# Patient Record
Sex: Female | Born: 1935 | Race: White | Hispanic: No | Marital: Married | Smoking: Former smoker
Health system: Southern US, Community
[De-identification: ages and names within clinical notes are randomized; demographics above are authoritative.]

## PROBLEM LIST (undated history)

## (undated) DIAGNOSIS — Z9989 Dependence on other enabling machines and devices: Secondary | ICD-10-CM

## (undated) DIAGNOSIS — I6529 Occlusion and stenosis of unspecified carotid artery: Secondary | ICD-10-CM

## (undated) DIAGNOSIS — J069 Acute upper respiratory infection, unspecified: Secondary | ICD-10-CM

## (undated) DIAGNOSIS — K219 Gastro-esophageal reflux disease without esophagitis: Secondary | ICD-10-CM

## (undated) DIAGNOSIS — G4733 Obstructive sleep apnea (adult) (pediatric): Secondary | ICD-10-CM

## (undated) DIAGNOSIS — I10 Essential (primary) hypertension: Secondary | ICD-10-CM

## (undated) DIAGNOSIS — K5090 Crohn's disease, unspecified, without complications: Secondary | ICD-10-CM

## (undated) DIAGNOSIS — I739 Peripheral vascular disease, unspecified: Secondary | ICD-10-CM

## (undated) DIAGNOSIS — I493 Ventricular premature depolarization: Secondary | ICD-10-CM

## (undated) DIAGNOSIS — E781 Pure hyperglyceridemia: Secondary | ICD-10-CM

## (undated) DIAGNOSIS — Z7901 Long term (current) use of anticoagulants: Secondary | ICD-10-CM

## (undated) DIAGNOSIS — M797 Fibromyalgia: Secondary | ICD-10-CM

## (undated) DIAGNOSIS — Z8719 Personal history of other diseases of the digestive system: Secondary | ICD-10-CM

## (undated) DIAGNOSIS — I5032 Chronic diastolic (congestive) heart failure: Secondary | ICD-10-CM

## (undated) DIAGNOSIS — H3320 Serous retinal detachment, unspecified eye: Secondary | ICD-10-CM

## (undated) DIAGNOSIS — R42 Dizziness and giddiness: Secondary | ICD-10-CM

## (undated) DIAGNOSIS — I4891 Unspecified atrial fibrillation: Secondary | ICD-10-CM

## (undated) DIAGNOSIS — M1990 Unspecified osteoarthritis, unspecified site: Secondary | ICD-10-CM

## (undated) DIAGNOSIS — I2699 Other pulmonary embolism without acute cor pulmonale: Secondary | ICD-10-CM

## (undated) DIAGNOSIS — N390 Urinary tract infection, site not specified: Secondary | ICD-10-CM

## (undated) HISTORY — DX: Long term (current) use of anticoagulants: Z79.01

## (undated) HISTORY — DX: Dizziness and giddiness: R42

## (undated) HISTORY — DX: Essential (primary) hypertension: I10

## (undated) HISTORY — DX: Obstructive sleep apnea (adult) (pediatric): G47.33

## (undated) HISTORY — DX: Occlusion and stenosis of unspecified carotid artery: I65.29

## (undated) HISTORY — DX: Other pulmonary embolism without acute cor pulmonale: I26.99

## (undated) HISTORY — PX: TONSILLECTOMY AND ADENOIDECTOMY: SUR1326

## (undated) HISTORY — PX: SPINE SURGERY: SHX786

## (undated) HISTORY — PX: CARDIAC CATHETERIZATION: SHX172

## (undated) HISTORY — DX: Unspecified atrial fibrillation: I48.91

## (undated) HISTORY — PX: CHOLECYSTECTOMY: SHX55

## (undated) HISTORY — DX: Unspecified osteoarthritis, unspecified site: M19.90

## (undated) HISTORY — PX: DECOMPRESSIVE LUMBAR LAMINECTOMY LEVEL 3: SHX5793

## (undated) HISTORY — DX: Fibromyalgia: M79.7

## (undated) HISTORY — DX: Dependence on other enabling machines and devices: Z99.89

## (undated) HISTORY — PX: DILATION AND CURETTAGE OF UTERUS: SHX78

---
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
## 1968-10-12 HISTORY — PX: CARDIAC CATHETERIZATION: SHX172

## 1969-02-11 HISTORY — PX: VAGINAL HYSTERECTOMY: SUR661

## 1997-12-28 ENCOUNTER — Ambulatory Visit (HOSPITAL_COMMUNITY): Admission: RE | Admit: 1997-12-28 | Discharge: 1997-12-28 | Payer: Self-pay | Admitting: Internal Medicine

## 1997-12-28 ENCOUNTER — Encounter: Payer: Self-pay | Admitting: Internal Medicine

## 1999-01-12 ENCOUNTER — Encounter (INDEPENDENT_AMBULATORY_CARE_PROVIDER_SITE_OTHER): Payer: Self-pay | Admitting: Specialist

## 1999-01-12 ENCOUNTER — Ambulatory Visit (HOSPITAL_COMMUNITY): Admission: RE | Admit: 1999-01-12 | Discharge: 1999-01-12 | Payer: Self-pay | Admitting: Gastroenterology

## 1999-01-16 ENCOUNTER — Encounter: Payer: Self-pay | Admitting: Internal Medicine

## 1999-01-16 ENCOUNTER — Encounter: Admission: RE | Admit: 1999-01-16 | Discharge: 1999-01-16 | Payer: Self-pay | Admitting: Internal Medicine

## 1999-10-31 ENCOUNTER — Other Ambulatory Visit: Admission: RE | Admit: 1999-10-31 | Discharge: 1999-10-31 | Payer: Self-pay | Admitting: Internal Medicine

## 2000-01-23 ENCOUNTER — Encounter: Payer: Self-pay | Admitting: Internal Medicine

## 2000-01-23 ENCOUNTER — Encounter: Admission: RE | Admit: 2000-01-23 | Discharge: 2000-01-23 | Payer: Self-pay | Admitting: Internal Medicine

## 2000-05-20 ENCOUNTER — Encounter (INDEPENDENT_AMBULATORY_CARE_PROVIDER_SITE_OTHER): Payer: Self-pay

## 2000-05-20 ENCOUNTER — Ambulatory Visit (HOSPITAL_COMMUNITY): Admission: RE | Admit: 2000-05-20 | Discharge: 2000-05-20 | Payer: Self-pay | Admitting: Orthopedic Surgery

## 2000-05-20 HISTORY — PX: EXCISION MORTON'S NEUROMA: SHX5013

## 2001-01-28 ENCOUNTER — Encounter: Admission: RE | Admit: 2001-01-28 | Discharge: 2001-01-28 | Payer: Self-pay | Admitting: Internal Medicine

## 2001-01-28 ENCOUNTER — Encounter: Payer: Self-pay | Admitting: Internal Medicine

## 2001-11-25 ENCOUNTER — Encounter: Payer: Self-pay | Admitting: Orthopedic Surgery

## 2001-11-25 ENCOUNTER — Encounter: Admission: RE | Admit: 2001-11-25 | Discharge: 2001-11-25 | Payer: Self-pay | Admitting: Orthopedic Surgery

## 2001-12-14 ENCOUNTER — Encounter: Admission: RE | Admit: 2001-12-14 | Discharge: 2001-12-14 | Payer: Self-pay | Admitting: Urology

## 2001-12-14 ENCOUNTER — Encounter: Payer: Self-pay | Admitting: Urology

## 2002-01-19 ENCOUNTER — Encounter: Admission: RE | Admit: 2002-01-19 | Discharge: 2002-01-19 | Payer: Self-pay | Admitting: Internal Medicine

## 2002-01-19 ENCOUNTER — Encounter: Payer: Self-pay | Admitting: Internal Medicine

## 2002-09-24 ENCOUNTER — Ambulatory Visit (HOSPITAL_COMMUNITY): Admission: RE | Admit: 2002-09-24 | Discharge: 2002-09-24 | Payer: Self-pay | Admitting: Orthopedic Surgery

## 2002-09-24 ENCOUNTER — Encounter: Payer: Self-pay | Admitting: Orthopedic Surgery

## 2002-11-29 ENCOUNTER — Encounter: Payer: Self-pay | Admitting: Orthopedic Surgery

## 2002-12-08 ENCOUNTER — Inpatient Hospital Stay (HOSPITAL_COMMUNITY): Admission: RE | Admit: 2002-12-08 | Discharge: 2002-12-13 | Payer: Self-pay | Admitting: Orthopedic Surgery

## 2002-12-08 HISTORY — PX: TOTAL HIP ARTHROPLASTY: SHX124

## 2003-01-25 ENCOUNTER — Encounter: Admission: RE | Admit: 2003-01-25 | Discharge: 2003-01-25 | Payer: Self-pay | Admitting: Internal Medicine

## 2003-05-24 ENCOUNTER — Encounter: Admission: RE | Admit: 2003-05-24 | Discharge: 2003-05-24 | Payer: Self-pay | Admitting: Orthopedic Surgery

## 2003-09-16 ENCOUNTER — Encounter: Admission: RE | Admit: 2003-09-16 | Discharge: 2003-09-16 | Payer: Self-pay | Admitting: Orthopedic Surgery

## 2004-03-13 ENCOUNTER — Encounter: Admission: RE | Admit: 2004-03-13 | Discharge: 2004-03-13 | Payer: Self-pay | Admitting: Internal Medicine

## 2004-11-09 ENCOUNTER — Ambulatory Visit (HOSPITAL_COMMUNITY): Admission: RE | Admit: 2004-11-09 | Discharge: 2004-11-09 | Payer: Self-pay | Admitting: Specialist

## 2004-11-22 ENCOUNTER — Ambulatory Visit (HOSPITAL_COMMUNITY): Admission: RE | Admit: 2004-11-22 | Discharge: 2004-11-22 | Payer: Self-pay | Admitting: Internal Medicine

## 2004-12-25 ENCOUNTER — Encounter: Admission: RE | Admit: 2004-12-25 | Discharge: 2005-03-25 | Payer: Self-pay | Admitting: Internal Medicine

## 2005-03-26 ENCOUNTER — Encounter: Admission: RE | Admit: 2005-03-26 | Discharge: 2005-05-06 | Payer: Self-pay | Admitting: Internal Medicine

## 2005-04-02 ENCOUNTER — Encounter: Admission: RE | Admit: 2005-04-02 | Discharge: 2005-04-02 | Payer: Self-pay | Admitting: Internal Medicine

## 2005-07-23 ENCOUNTER — Encounter: Admission: RE | Admit: 2005-07-23 | Discharge: 2005-07-23 | Payer: Self-pay | Admitting: Orthopedic Surgery

## 2005-07-26 ENCOUNTER — Ambulatory Visit (HOSPITAL_BASED_OUTPATIENT_CLINIC_OR_DEPARTMENT_OTHER): Admission: RE | Admit: 2005-07-26 | Discharge: 2005-07-26 | Payer: Self-pay | Admitting: Orthopedic Surgery

## 2005-07-26 HISTORY — PX: KNEE ARTHROSCOPY: SHX127

## 2006-04-07 ENCOUNTER — Encounter: Admission: RE | Admit: 2006-04-07 | Discharge: 2006-04-07 | Payer: Self-pay | Admitting: Internal Medicine

## 2006-09-22 ENCOUNTER — Encounter: Admission: RE | Admit: 2006-09-22 | Discharge: 2006-09-22 | Payer: Self-pay | Admitting: Orthopedic Surgery

## 2006-10-29 HISTORY — PX: BACK SURGERY: SHX140

## 2006-10-30 ENCOUNTER — Inpatient Hospital Stay (HOSPITAL_COMMUNITY): Admission: RE | Admit: 2006-10-30 | Discharge: 2006-10-31 | Payer: Self-pay | Admitting: Orthopedic Surgery

## 2007-01-16 ENCOUNTER — Ambulatory Visit: Payer: Self-pay | Admitting: Vascular Surgery

## 2007-02-12 HISTORY — PX: CATARACT EXTRACTION W/ INTRAOCULAR LENS IMPLANT: SHX1309

## 2007-02-12 HISTORY — PX: JOINT REPLACEMENT: SHX530

## 2007-04-28 ENCOUNTER — Encounter: Admission: RE | Admit: 2007-04-28 | Discharge: 2007-04-28 | Payer: Self-pay | Admitting: Internal Medicine

## 2008-01-25 ENCOUNTER — Ambulatory Visit: Payer: Self-pay | Admitting: Vascular Surgery

## 2008-05-02 ENCOUNTER — Encounter: Admission: RE | Admit: 2008-05-02 | Discharge: 2008-05-02 | Payer: Self-pay | Admitting: Internal Medicine

## 2008-08-29 ENCOUNTER — Encounter: Admission: RE | Admit: 2008-08-29 | Discharge: 2008-08-29 | Payer: Self-pay | Admitting: Unknown Physician Specialty

## 2008-10-25 ENCOUNTER — Ambulatory Visit (HOSPITAL_BASED_OUTPATIENT_CLINIC_OR_DEPARTMENT_OTHER): Admission: RE | Admit: 2008-10-25 | Discharge: 2008-10-25 | Payer: Self-pay | Admitting: Orthopedic Surgery

## 2008-10-25 HISTORY — PX: HAMMER TOE SURGERY: SHX385

## 2009-01-23 ENCOUNTER — Ambulatory Visit: Payer: Self-pay | Admitting: Vascular Surgery

## 2009-02-11 DIAGNOSIS — I2699 Other pulmonary embolism without acute cor pulmonale: Secondary | ICD-10-CM

## 2009-02-11 HISTORY — DX: Other pulmonary embolism without acute cor pulmonale: I26.99

## 2009-02-11 HISTORY — PX: FRACTURE SURGERY: SHX138

## 2009-02-18 ENCOUNTER — Inpatient Hospital Stay (HOSPITAL_COMMUNITY): Admission: EM | Admit: 2009-02-18 | Discharge: 2009-02-28 | Payer: Self-pay | Admitting: Emergency Medicine

## 2009-02-18 ENCOUNTER — Ambulatory Visit: Payer: Self-pay | Admitting: Internal Medicine

## 2009-02-21 HISTORY — PX: FEMUR FRACTURE SURGERY: SHX633

## 2009-02-23 ENCOUNTER — Ambulatory Visit: Payer: Self-pay | Admitting: Surgery

## 2009-02-23 ENCOUNTER — Encounter: Payer: Self-pay | Admitting: Internal Medicine

## 2009-05-09 ENCOUNTER — Encounter: Admission: RE | Admit: 2009-05-09 | Discharge: 2009-05-09 | Payer: Self-pay | Admitting: Internal Medicine

## 2009-07-18 ENCOUNTER — Ambulatory Visit: Payer: Self-pay | Admitting: Vascular Surgery

## 2009-10-02 ENCOUNTER — Ambulatory Visit: Payer: Self-pay | Admitting: Cardiology

## 2009-11-06 ENCOUNTER — Ambulatory Visit: Payer: Self-pay | Admitting: Cardiology

## 2009-12-05 ENCOUNTER — Ambulatory Visit: Payer: Self-pay | Admitting: Cardiovascular Disease

## 2009-12-19 ENCOUNTER — Ambulatory Visit: Payer: Self-pay | Admitting: Cardiovascular Disease

## 2010-01-08 ENCOUNTER — Ambulatory Visit: Payer: Self-pay | Admitting: Cardiology

## 2010-01-15 ENCOUNTER — Telehealth (INDEPENDENT_AMBULATORY_CARE_PROVIDER_SITE_OTHER): Payer: Self-pay | Admitting: *Deleted

## 2010-01-16 ENCOUNTER — Encounter: Payer: Self-pay | Admitting: Cardiology

## 2010-01-16 ENCOUNTER — Ambulatory Visit: Payer: Self-pay

## 2010-01-16 ENCOUNTER — Encounter (HOSPITAL_COMMUNITY)
Admission: RE | Admit: 2010-01-16 | Discharge: 2010-03-13 | Payer: Self-pay | Source: Home / Self Care | Attending: Cardiology | Admitting: Cardiology

## 2010-01-16 ENCOUNTER — Encounter: Payer: Self-pay | Admitting: *Deleted

## 2010-01-17 ENCOUNTER — Ambulatory Visit: Payer: Self-pay

## 2010-01-29 ENCOUNTER — Ambulatory Visit: Payer: Self-pay | Admitting: Vascular Surgery

## 2010-02-08 ENCOUNTER — Ambulatory Visit: Payer: Self-pay | Admitting: Cardiology

## 2010-02-11 HISTORY — PX: JOINT REPLACEMENT: SHX530

## 2010-02-19 ENCOUNTER — Inpatient Hospital Stay (HOSPITAL_COMMUNITY)
Admission: RE | Admit: 2010-02-19 | Discharge: 2010-02-22 | Payer: Self-pay | Source: Home / Self Care | Attending: Orthopedic Surgery | Admitting: Orthopedic Surgery

## 2010-02-19 HISTORY — PX: REPLACEMENT TOTAL KNEE: SUR1224

## 2010-02-26 LAB — BASIC METABOLIC PANEL
BUN: 13 mg/dL (ref 6–23)
BUN: 17 mg/dL (ref 6–23)
CO2: 24 mEq/L (ref 19–32)
CO2: 25 mEq/L (ref 19–32)
Calcium: 8.4 mg/dL (ref 8.4–10.5)
Calcium: 8 mg/dL — ABNORMAL LOW (ref 8.4–10.5)
Chloride: 105 mEq/L (ref 96–112)
Chloride: 106 mEq/L (ref 96–112)
Creatinine, Ser: 0.95 mg/dL (ref 0.4–1.2)
Creatinine, Ser: 1.28 mg/dL — ABNORMAL HIGH (ref 0.4–1.2)
GFR calc Af Amer: >60 mL/min (ref >60)
GFR calc Af Amer: 49 mL/min — ABNORMAL LOW (ref >60)
GFR calc non Af Amer: 58 mL/min — ABNORMAL LOW (ref >60)
GFR calc non Af Amer: 41 mL/min — ABNORMAL LOW (ref >60)
Glucose, Bld: 122 mg/dL — ABNORMAL HIGH (ref 70–99)
Glucose, Bld: 142 mg/dL — ABNORMAL HIGH (ref 70–99)
Potassium: 4 mEq/L (ref 3.5–5.1)
Potassium: 3.6 mEq/L (ref 3.5–5.1)
Sodium: 138 mEq/L (ref 135–145)
Sodium: 138 mEq/L (ref 135–145)

## 2010-02-26 LAB — CBC
HCT: 32.4 % — ABNORMAL LOW (ref 36.0–46.0)
HCT: 29.7 % — ABNORMAL LOW (ref 36.0–46.0)
HCT: 29.2 % — ABNORMAL LOW (ref 36.0–46.0)
Hemoglobin: 10.4 g/dL — ABNORMAL LOW (ref 12.0–15.0)
Hemoglobin: 9.4 g/dL — ABNORMAL LOW (ref 12.0–15.0)
Hemoglobin: 9.3 g/dL — ABNORMAL LOW (ref 12.0–15.0)
MCH: 28.8 pg (ref 26.0–34.0)
MCH: 28.7 pg (ref 26.0–34.0)
MCH: 28.8 pg (ref 26.0–34.0)
MCHC: 32.1 g/dL (ref 30.0–36.0)
MCHC: 31.6 g/dL (ref 30.0–36.0)
MCHC: 31.8 g/dL (ref 30.0–36.0)
MCV: 89.8 fL (ref 78.0–100.0)
MCV: 90.5 fL (ref 78.0–100.0)
MCV: 90.4 fL (ref 78.0–100.0)
Platelets: 100 10*3/uL — ABNORMAL LOW (ref 150–400)
Platelets: 100 10*3/uL — ABNORMAL LOW (ref 150–400)
Platelets: 114 10*3/uL — ABNORMAL LOW (ref 150–400)
RBC: 3.61 MIL/uL — ABNORMAL LOW (ref 3.87–5.11)
RBC: 3.28 MIL/uL — ABNORMAL LOW (ref 3.87–5.11)
RBC: 3.23 MIL/uL — ABNORMAL LOW (ref 3.87–5.11)
RDW: 15 % (ref 11.5–15.5)
RDW: 15.2 % (ref 11.5–15.5)
RDW: 15.1 % (ref 11.5–15.5)
WBC: 7.3 10*3/uL (ref 4.0–10.5)
WBC: 6.9 10*3/uL (ref 4.0–10.5)
WBC: 6.2 10*3/uL (ref 4.0–10.5)

## 2010-02-26 LAB — TYPE AND SCREEN
ABO/RH(D): A POS
Antibody Screen: NEGATIVE

## 2010-02-26 LAB — PROTIME-INR
INR: 1.24 (ref 0.00–1.49)
INR: 1.26 (ref 0.00–1.49)
INR: 1.28 (ref 0.00–1.49)
INR: 1.23 (ref 0.00–1.49)
Prothrombin Time: 15.8 seconds — ABNORMAL HIGH (ref 11.6–15.2)
Prothrombin Time: 16 seconds — ABNORMAL HIGH (ref 11.6–15.2)
Prothrombin Time: 16.2 seconds — ABNORMAL HIGH (ref 11.6–15.2)
Prothrombin Time: 15.7 seconds — ABNORMAL HIGH (ref 11.6–15.2)

## 2010-02-26 LAB — APTT: aPTT: 28 seconds (ref 24–37)

## 2010-03-13 NOTE — Assessment & Plan Note (Signed)
Summary: Cardiology Nuclear Testing  Nuclear Med Background Indications for Stress Test: Evaluation for Ischemia, Surgical Clearance  Indications Comments: Pending TKR on 02/19/10 by Dr. Gaynelle Arabian  History: Abnormal EKG, Echo, Heart Catheterization, Myocardial Perfusion Study  History Comments: '94 Cath:normal; '08 MPS:no ischemia; 1/11 Echo:EF=60-65%  Symptoms: DOE, Rapid HR    Nuclear Pre-Procedure Cardiac Risk Factors: Carotid Disease, Family History - CAD, History of Smoking, Hypertension, Lipids, Obesity Caffeine/Decaff Intake: None NPO After: 6:00 PM Lungs: Clear.  O2 Sat 96% on RA. IV 0.9% NS with Angio Cath: 24g     IV Site: R Forearm IV Started by: Irven Baltimore, RN Chest Size (in) 40     Cup Size C     Height (in): 62 Weight (lb): 225 BMI: 41.30 Tech Comments: Toprol held this a.m.  Nuclear Med Study 1 or 2 day study:  2 day     Stress Test Type:  Carlton Adam Reading MD:  Darlin Coco, MD     Referring MD:  Darlin Coco, MD; Gaynelle Arabian, MD Resting Radionuclide:  Technetium 5mTetrofosmin     Resting Radionuclide Dose:  33 mCi  Stress Radionuclide:  Technetium 931metrofosmin     Stress Radionuclide Dose:  33 mCi   Stress Protocol   Lexiscan: 0.4 mg   Stress Test Technologist:  ShValetta FullerCMA-N     Nuclear Technologist:  ToAnnye RuskCNMT  Rest Procedure  Myocardial perfusion imaging was performed at rest 45 minutes following the intravenous administration of Technetium 9980mtrofosmin.  Stress Procedure  The patient received IV Lexiscan 0.4 mg over 15-seconds.  Technetium 39m74mrofosmin injected at 30-seconds.  There were no significant changes with infusion, other than occasional PVC's.  Quantitative spect images were obtained after a 45 minute delay.  QPS Raw Data Images:  Normal; no motion artifact; normal heart/lung ratio. Stress Images:  Normal homogeneous uptake in all areas of the myocardium. Rest Images:  Normal homogeneous uptake in  all areas of the myocardium. Subtraction (SDS):  No evidence of ischemia. Transient Ischemic Dilatation:  .88  (Normal <1.22)  Lung/Heart Ratio:  .26  (Normal <0.45)  Quantitative Gated Spect Images QGS EDV:  63 ml QGS ESV:  14 ml QGS EF:  78 % QGS cine images:  No wall motion abnormalities.  Findings Normal nuclear study      Overall Impression  Exercise Capacity: Lexiscan with no exercise. BP Response: Normal blood pressure response. Clinical Symptoms: No chest pain ECG Impression: Anterolateral ST-T abnormalities at rest remain unchanged with stress. Overall Impression: Normal stress nuclear study. Overall Impression Comments: No ischemia.  Normal LV systolic function.  Appended Document: Cardiology Nuclear Testing COPY FAXED TO DR. ALUIWynelle LinkO DR.BRACKBILL

## 2010-03-13 NOTE — Progress Notes (Signed)
Summary: Nuclear Pre-Procedure  Phone Note Outgoing Call   Call placed by: Perrin Maltese, EMT-P,  January 15, 2010 1:15 PM Summary of Call: Reviewed information on [employer] Information Sheet (see scanned document for further details).  Spoke with patient.     Nuclear Med Background Indications for Stress Test: Evaluation for Ischemia, Surgical Clearance  Indications Comments: Pending knee replacement  History: Abnormal EKG, Echo, Heart Catheterization, Myocardial Perfusion Study  History Comments: '94 Heart Cath NL '08 MPS (-) ischemia NLEF 01/11 ECHO EF 60-65% H/O PE  Crohn's  Symptoms: DOE    Nuclear Pre-Procedure Cardiac Risk Factors: Carotid Disease, History of Smoking, Hypertension, Lipids  Nuclear Med Study Referring MD:  T.Brackbill

## 2010-04-04 ENCOUNTER — Other Ambulatory Visit: Payer: Self-pay | Admitting: Internal Medicine

## 2010-04-04 DIAGNOSIS — Z1231 Encounter for screening mammogram for malignant neoplasm of breast: Secondary | ICD-10-CM

## 2010-04-11 ENCOUNTER — Ambulatory Visit: Payer: Self-pay | Admitting: Cardiology

## 2010-04-16 ENCOUNTER — Encounter (INDEPENDENT_AMBULATORY_CARE_PROVIDER_SITE_OTHER): Payer: Medicare Other

## 2010-04-16 DIAGNOSIS — I119 Hypertensive heart disease without heart failure: Secondary | ICD-10-CM

## 2010-04-16 DIAGNOSIS — Z7901 Long term (current) use of anticoagulants: Secondary | ICD-10-CM

## 2010-04-23 LAB — COMPREHENSIVE METABOLIC PANEL
ALT: 26 U/L (ref 0–35)
AST: 29 U/L (ref 0–37)
CO2: 26 mEq/L (ref 19–32)
Calcium: 9.4 mg/dL (ref 8.4–10.5)
Creatinine, Ser: 0.95 mg/dL (ref 0.4–1.2)
GFR calc Af Amer: >60 mL/min (ref >60)
GFR calc non Af Amer: 58 mL/min — ABNORMAL LOW (ref >60)
Sodium: 144 mEq/L (ref 135–145)
Total Protein: 7 g/dL (ref 6.0–8.3)

## 2010-04-23 LAB — SURGICAL PCR SCREEN
MRSA, PCR: NEGATIVE
Staphylococcus aureus: NEGATIVE

## 2010-04-23 LAB — URINALYSIS, ROUTINE W REFLEX MICROSCOPIC
Bilirubin Urine: NEGATIVE
Protein, ur: NEGATIVE mg/dL
pH: 5.5 (ref 5.0–8.0)

## 2010-04-23 LAB — CBC
HCT: 42.4 % (ref 36.0–46.0)
MCH: 29.3 pg (ref 26.0–34.0)
MCV: 90 fL (ref 78.0–100.0)
Platelets: 144 10*3/uL — ABNORMAL LOW (ref 150–400)
RBC: 4.71 MIL/uL (ref 3.87–5.11)
WBC: 8.3 10*3/uL (ref 4.0–10.5)

## 2010-04-23 LAB — URINE MICROSCOPIC-ADD ON

## 2010-04-23 LAB — PROTIME-INR: Prothrombin Time: 27.9 seconds — ABNORMAL HIGH (ref 11.6–15.2)

## 2010-04-23 LAB — APTT: aPTT: 40 seconds — ABNORMAL HIGH (ref 24–37)

## 2010-04-28 LAB — BASIC METABOLIC PANEL
BUN: 11 mg/dL (ref 6–23)
BUN: 13 mg/dL (ref 6–23)
Calcium: 8.4 mg/dL (ref 8.4–10.5)
Calcium: 9.4 mg/dL (ref 8.4–10.5)
Chloride: 104 mEq/L (ref 96–112)
Chloride: 107 mEq/L (ref 96–112)
Creatinine, Ser: 0.78 mg/dL (ref 0.4–1.2)
Creatinine, Ser: 1.01 mg/dL (ref 0.4–1.2)
GFR calc Af Amer: >60 mL/min (ref >60)
GFR calc Af Amer: >60 mL/min (ref >60)
GFR calc non Af Amer: >60 mL/min (ref >60)
Potassium: 3.3 mEq/L — ABNORMAL LOW (ref 3.5–5.1)
Sodium: 140 mEq/L (ref 135–145)

## 2010-04-28 LAB — CBC
HCT: 29.9 % — ABNORMAL LOW (ref 36.0–46.0)
HCT: 30.9 % — ABNORMAL LOW (ref 36.0–46.0)
HCT: 30.9 % — ABNORMAL LOW (ref 36.0–46.0)
Hemoglobin: 10.1 g/dL — ABNORMAL LOW (ref 12.0–15.0)
Hemoglobin: 10.4 g/dL — ABNORMAL LOW (ref 12.0–15.0)
Hemoglobin: 12.7 g/dL (ref 12.0–15.0)
Hemoglobin: 9.8 g/dL — ABNORMAL LOW (ref 12.0–15.0)
MCHC: 33.8 g/dL (ref 30.0–36.0)
MCHC: 33.9 g/dL (ref 30.0–36.0)
MCV: 91.7 fL (ref 78.0–100.0)
MCV: 92.1 fL (ref 78.0–100.0)
MCV: 92.3 fL (ref 78.0–100.0)
Platelets: 103 10*3/uL — ABNORMAL LOW (ref 150–400)
Platelets: 114 10*3/uL — ABNORMAL LOW (ref 150–400)
RBC: 3.15 MIL/uL — ABNORMAL LOW (ref 3.87–5.11)
RBC: 3.36 MIL/uL — ABNORMAL LOW (ref 3.87–5.11)
RBC: 3.37 MIL/uL — ABNORMAL LOW (ref 3.87–5.11)
RBC: 4.14 MIL/uL (ref 3.87–5.11)
RDW: 15.1 % (ref 11.5–15.5)
WBC: 8.3 10*3/uL (ref 4.0–10.5)
WBC: 8.6 10*3/uL (ref 4.0–10.5)
WBC: 8.9 10*3/uL (ref 4.0–10.5)
WBC: 9.4 10*3/uL (ref 4.0–10.5)

## 2010-04-28 LAB — TYPE AND SCREEN: Antibody Screen: NEGATIVE

## 2010-04-28 LAB — DIFFERENTIAL
Eosinophils Relative: 2 % (ref 0–5)
Lymphocytes Relative: 26 % (ref 12–46)
Lymphs Abs: 2.2 10*3/uL (ref 0.7–4.0)
Monocytes Absolute: 0.9 10*3/uL (ref 0.1–1.0)
Monocytes Relative: 11 % (ref 3–12)

## 2010-04-28 LAB — BRAIN NATRIURETIC PEPTIDE: Pro B Natriuretic peptide (BNP): 508 pg/mL — ABNORMAL HIGH (ref 0.0–100.0)

## 2010-04-28 LAB — PROTIME-INR
INR: 1.07 (ref 0.00–1.49)
Prothrombin Time: 13.8 seconds (ref 11.6–15.2)

## 2010-04-29 LAB — HEPARIN LEVEL (UNFRACTIONATED)
Heparin Unfractionated: 0.27 IU/mL — ABNORMAL LOW (ref 0.30–0.70)
Heparin Unfractionated: 0.48 IU/mL (ref 0.30–0.70)

## 2010-04-29 LAB — PROTIME-INR
INR: 1.89 — ABNORMAL HIGH (ref 0.00–1.49)
INR: 2.17 — ABNORMAL HIGH (ref 0.00–1.49)
Prothrombin Time: 21.5 seconds — ABNORMAL HIGH (ref 11.6–15.2)
Prothrombin Time: 24 seconds — ABNORMAL HIGH (ref 11.6–15.2)

## 2010-04-29 LAB — CBC
HCT: 29.7 % — ABNORMAL LOW (ref 36.0–46.0)
Hemoglobin: 9.8 g/dL — ABNORMAL LOW (ref 12.0–15.0)
Hemoglobin: 9.9 g/dL — ABNORMAL LOW (ref 12.0–15.0)
MCHC: 33.4 g/dL (ref 30.0–36.0)
MCHC: 33.8 g/dL (ref 30.0–36.0)
MCV: 91.6 fL (ref 78.0–100.0)
Platelets: 162 10*3/uL (ref 150–400)
Platelets: 202 10*3/uL (ref 150–400)
RDW: 14.7 % (ref 11.5–15.5)
RDW: 14.7 % (ref 11.5–15.5)
WBC: 8.3 10*3/uL (ref 4.0–10.5)

## 2010-04-30 ENCOUNTER — Ambulatory Visit (INDEPENDENT_AMBULATORY_CARE_PROVIDER_SITE_OTHER): Payer: Medicare Other | Admitting: Cardiology

## 2010-04-30 DIAGNOSIS — I2699 Other pulmonary embolism without acute cor pulmonale: Secondary | ICD-10-CM

## 2010-04-30 DIAGNOSIS — Z7901 Long term (current) use of anticoagulants: Secondary | ICD-10-CM

## 2010-05-15 ENCOUNTER — Ambulatory Visit: Payer: Self-pay

## 2010-05-18 LAB — POCT I-STAT 4, (NA,K, GLUC, HGB,HCT)
Hemoglobin: 13.3 g/dL (ref 12.0–15.0)
Sodium: 140 mEq/L (ref 135–145)

## 2010-05-18 LAB — WOUND CULTURE

## 2010-05-28 ENCOUNTER — Encounter: Payer: PRIVATE HEALTH INSURANCE | Admitting: *Deleted

## 2010-05-28 ENCOUNTER — Ambulatory Visit (INDEPENDENT_AMBULATORY_CARE_PROVIDER_SITE_OTHER): Payer: Medicare Other | Admitting: *Deleted

## 2010-05-28 DIAGNOSIS — I2699 Other pulmonary embolism without acute cor pulmonale: Secondary | ICD-10-CM | POA: Insufficient documentation

## 2010-05-28 DIAGNOSIS — Z7901 Long term (current) use of anticoagulants: Secondary | ICD-10-CM

## 2010-05-28 LAB — POCT INR: INR: 1.8

## 2010-06-11 ENCOUNTER — Other Ambulatory Visit: Payer: Self-pay | Admitting: *Deleted

## 2010-06-11 DIAGNOSIS — R609 Edema, unspecified: Secondary | ICD-10-CM

## 2010-06-11 MED ORDER — FUROSEMIDE 40 MG PO TABS: ORAL_TABLET | ORAL | Status: DC

## 2010-06-11 NOTE — Telephone Encounter (Signed)
Refilled meds per fax request.  

## 2010-06-12 ENCOUNTER — Ambulatory Visit
Admission: RE | Admit: 2010-06-12 | Discharge: 2010-06-12 | Disposition: A | Discharge status: Home / Self Care | Payer: Medicare Other | Source: Ambulatory Visit | Attending: Internal Medicine | Admitting: Internal Medicine

## 2010-06-12 DIAGNOSIS — Z1231 Encounter for screening mammogram for malignant neoplasm of breast: Secondary | ICD-10-CM

## 2010-06-19 ENCOUNTER — Ambulatory Visit (INDEPENDENT_AMBULATORY_CARE_PROVIDER_SITE_OTHER): Payer: Medicare Other | Admitting: *Deleted

## 2010-06-19 DIAGNOSIS — Z7901 Long term (current) use of anticoagulants: Secondary | ICD-10-CM

## 2010-06-19 DIAGNOSIS — I2699 Other pulmonary embolism without acute cor pulmonale: Secondary | ICD-10-CM

## 2010-06-25 ENCOUNTER — Encounter: Payer: PRIVATE HEALTH INSURANCE | Admitting: *Deleted

## 2010-06-26 ENCOUNTER — Encounter: Payer: PRIVATE HEALTH INSURANCE | Admitting: *Deleted

## 2010-06-26 NOTE — Procedures (Signed)
CAROTID DUPLEX EXAM   INDICATION:  Follow-up evaluation of known carotid artery disease.   HISTORY:  Diabetes:  No.  Cardiac:  No.  Hypertension:  Yes.  Smoking:  Quit over 15 years ago.  Previous Surgery:  No.  CV History:  Previous duplex on 01/16/2007 revealed an occluded right  ICA and a 60-79% left ICA stenosis.  Amaurosis Fugax No, Paresthesias No, Hemiparesis No.                                       RIGHT             LEFT  Brachial systolic pressure:         160               160  Brachial Doppler waveforms:         Triphasic         Triphasic  Vertebral direction of flow:        Antegrade         Antegrade  DUPLEX VELOCITIES (cm/sec)  CCA peak systolic                   49                78  ECA peak systolic                   175               208  ICA peak systolic                   Occluded          138  ICA end diastolic                   Occluded          50  PLAQUE MORPHOLOGY:                  Mixed             Calcified  PLAQUE AMOUNT:                      Severe            Moderate-to-severe  PLAQUE LOCATION:                    Throughout ICA    Proximal ICA, ECA   IMPRESSION:  1. Occluded right internal carotid artery.  2. 60-79% left internal carotid artery stenosis.  3. No significant change from previous study.   ___________________________________________  Rosetta Posner, M.D.   MC/MEDQ  D:  01/25/2008  T:  01/26/2008  Job:  [id number]

## 2010-06-26 NOTE — Op Note (Signed)
[name], [name]                 ACCOUNT NO.:  [id number]   MEDICAL RECORD NO.:  [id number]          PATIENT TYPE:  AMB   LOCATION:  DAY                          FACILITY:  [facility]   PHYSICIAN:  Tarri Glenn, M.D.  DATE OF BIRTH:  1934/10/20   DATE OF PROCEDURE:  10/29/2006  DATE OF DISCHARGE:                               OPERATIVE REPORT   PREOPERATIVE DIAGNOSIS:  1. Central and foraminal stenosis L3-L4, L4-L5, and L5-S1.  2. Possible disc herniations L4-L5 and L5-S1, right.   POSTOPERATIVE DIAGNOSIS:  Central and foraminal stenosis L3-L4, L4-L5,  and L5-S1.   OPERATION:  Central and foraminal decompression L3-L4, L4-L5, and L5-S1  with inspection of L4-L5 and L5-S1 disc on the right.   SURGEON:  Tarri Glenn, M.D.   ASSISTANT:  Kipp Brood. Gladstone Lighter, M.D.   ANESTHESIA:  General.   JUSTIFICATION FOR PROCEDURE:  I have been following her for a number of  years because of mild lumbar spinal stenosis.  Recently her leg  symptomatology has become much more severe with burning in both feet and  generalized numbness, particularly in the right foot with severe right  leg pain. A myelogram CT scan on September 22, 2006, demonstrated the above  mentioned diagnoses 1 and 2.  Consequently, she is here today for the  above mentioned surgery.   PROCEDURE:  Prophylactic antibiotics, satisfactory general anesthesia,  she was carefully rolled on a Wilson frame protecting her right total  hip replacement.  Her back was prepped with DuraPrep and draped in a  sterile field.  Ioban employed, a time out performed. A Foley catheter  was also inserted after the induction of general anesthesia.  I made a  midline incision and after identifying what appeared to be the spinous  process of L3 and L4, I tagged these with Kocher clamps and we took the  lateral x-ray identifying that these were probably on L3 and L4.  I then  continued the dissection distally until we reached the sacrum. Soft  tissue  was dissected off the neural arches at L3 to the sacrum and two  self-retaining McCullough retractors inserted.  I then began the  decompression process with double action rongeurs, removed the neural  arch of L5, L4, and a portion of the inferior spinous process of L3.  I  then took a second x-ray confirming that we were indeed, with the  Portland 4 instrument in place, at the L3-L4 level.   After removing a little bit more bone and finding her spinal canal quite  narrow making it difficult to work even with small Kerrison rongeurs, I  brought in the microscope and we completed the decompression centrally  and then worked laterally. Most of her pathology appeared to be at L4-  L5. Foraminotomies were performed at all levels.  Preoperatively, the S1  nerve root had poor filling on the right.  This nerve root was  thoroughly decompressed as part of the decompression procedure.  We then  inspected both the L4-L5 and L5-S1 disc which appeared to be flat.  Having completed the foraminotomies and decompression  to a hockey stick  both cephalad and caudad, we felt that we had completed the goals of the  surgery.   The wound was irrigated well with sterile saline.  Gelfoam soaked in  thrombin was placed about the dura.  The self-retaining retractors were  then carefully removed.  There was no unusual bleeding.  I then closed  the wound in layers with interrupted #1 Vicryl in the fascia leaving  about a 2 cm gap distally to prevent any egress of blood.  The deep  subcu tissue was closed with #1 Vicryl, superficially 2-0 Vicryl,  staples in the skin.  A Betadine Adaptic dry, sterile dressing were  applied.  She was gently and carefully, once again, placed on the PACU  bed with no known complications.  Estimated blood loss was perhaps 250  mL, no blood replacement.           ______________________________  Tarri Glenn, M.D.     JA/MEDQ  D:  10/29/2006  T:  10/29/2006  Job:  [id number]

## 2010-06-26 NOTE — Procedures (Signed)
CAROTID DUPLEX EXAM   INDICATION:  Follow up carotid disease.   HISTORY:  Diabetes:  No.  Cardiac:  No.  Hypertension:  Yes.  Smoking:  Previous.  Previous Surgery:  No.  CV History:  Asymptomatic.  Amaurosis Fugax No, Paresthesias No, Hemiparesis No.                                       RIGHT             LEFT  Brachial systolic pressure:         140               139  Brachial Doppler waveforms:         Triphasic         Triphasic  Vertebral direction of flow:        Antegrade         Antegrade  DUPLEX VELOCITIES (cm/sec)  CCA peak systolic                   61                747  ECA peak systolic                   90                340  ICA peak systolic                   Occluded          204 (bulb)  ICA end diastolic                   Occluded          26  PLAQUE MORPHOLOGY:                  Mixed             Mixed/calcified  PLAQUE AMOUNT:                      Severe            Moderate-to-severe  PLAQUE LOCATION:                    ICA               Bifurcation, ICA,  ECA   IMPRESSION:  1. Known right internal carotid artery occlusion.  2. 60-79% left internal carotid artery stenosis.  3. Left external carotid artery and common carotid artery stenosis.   ___________________________________________  Rosetta Posner, M.D.   CJ/MEDQ  D:  01/23/2009  T:  01/24/2009  Job:  [id number]

## 2010-06-26 NOTE — Procedures (Signed)
CAROTID DUPLEX EXAM   INDICATION:  Follow up carotid artery disease.   HISTORY:  Diabetes:  No.  Cardiac:  No.  Hypertension:  Yes.  Smoking:  Previous.  Previous Surgery:  No.  CV History:  No.  Amaurosis Fugax No, Paresthesias No, Hemiparesis No.                                       RIGHT             LEFT  Brachial systolic pressure:         140               148  Brachial Doppler waveforms:         WNL               WNL  Vertebral direction of flow:        Antegrade         Antegrade  DUPLEX VELOCITIES (cm/sec)  CCA peak systolic                   43                98  ECA peak systolic                   426               454  ICA peak systolic                   Occluded          098  ICA end diastolic                   Occluded          69  PLAQUE MORPHOLOGY:                  Heterogenous      Heterogenous  PLAQUE AMOUNT:                      Severe            Moderate-to-severe  PLAQUE LOCATION:                    ICA, ECA          ICA, ECA, bulb   IMPRESSION:  1. Known right internal carotid artery occlusion.  2. Left internal carotid artery suggests 60% to 79% stenosis.  3. Bilateral external carotid artery stenoses.  4. Antegrade flow in bilateral vertebrals.   ___________________________________________  Rosetta Posner, M.D.   CB/MEDQ  D:  07/18/2009  T:  07/18/2009  Job:  [id number]

## 2010-06-26 NOTE — Procedures (Signed)
CAROTID DUPLEX EXAM   INDICATION:  Follow up carotid artery disease.   HISTORY:  Diabetes:  No.  Cardiac:  No.  Hypertension:  Yes.  Smoking:  No.  Previous Surgery:  No.  CV History:  No.  Amaurosis Fugax No, Paresthesias No, Hemiparesis No                                       RIGHT             LEFT  Brachial systolic pressure:         158               157  Brachial Doppler waveforms:         Biphasic          Biphasic  Vertebral direction of flow:        Antegrade         Antegrade  DUPLEX VELOCITIES (cm/sec)  CCA peak systolic                   44                86  ECA peak systolic                   343               103  ICA peak systolic                   Occluded          013  ICA end diastolic                                     42  PLAQUE MORPHOLOGY:                  Mixed             Mixed  PLAQUE AMOUNT:                      Severe            Moderate/severe  PLAQUE LOCATION:                    ICA, ECA          ICA, ECA   IMPRESSION:  1. Right internal carotid artery known occlusion.  2. Left internal carotid artery shows evidence of 60-79% stenosis.  3. Bilateral external carotid artery stenosis.  4. Study shows no significant changes from previous study.   ___________________________________________  Rosetta Posner, M.D.   AS/MEDQ  D:  01/16/2007  T:  01/16/2007  Job:  [id number]

## 2010-06-26 NOTE — Assessment & Plan Note (Signed)
OFFICE VISIT   [name], [name]  DOB:  1934/07/05                                       07/18/2009  CHART#:[id number]   CHIEF COMPLAINT:  Follow-up left carotid stenosis.   HISTORY OF PRESENT ILLNESS:  Patient is a 76 year old woman who has a  known occlusion of the right internal carotid artery with no evidence of  stroke, who also has a 60% to 79% stenosis in the left carotid artery.  Since her last visit 6 months ago, she has been doing well without any  symptoms of TIA, amaurosis fugax, weakness on one side or the other, or  any dysarthria.  She had in January 2011 a fractured right femur and had  surgery with a titanium wiring of the fracture.  Post surgery, she  developed a PE and pneumonia.  She was placed on Coumadin and should be  off her Coumadin approximately 1 month.   PAST MEDICAL HISTORY:  She has a history of high blood pressure and  hypercholesterolemia.   SURGERIES:  She had a fractured femur repair in January 2011.  She had a  right total hip replacement in 2007 and a lumbar decompression in 2008.   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   REVIEW OF SYSTEMS:  GENERAL:  She is had some weight loss, which is due  to dieting.  VASCULAR:  She has had no stroke, mini stroke, slurred speech, temporary  blindness.  She has had the pulmonary embolus.  CARDIAC:  She has dyspnea on exertion but denies chest pain, chest  tightness, heart murmurs, or arrhythmias.  GASTROINTESTINAL:  She has a hiatal hernia.  NEUROLOGIC:  She denies any disease, blackouts, headaches, seizures.  PULMONARY:  She denies any productive cough, bronchitis, or coughing up  blood.  She did have a pneumonia after a recent hip surgery.  HEMATOLOGICAL:  She denies any bleeding problems, clotting disorders, or  anemia.  URINARY:  She denies any kidney disease, burning or frequency with  urination.  MUSCULOSKELETAL:  She has shoulder, knee, and hip arthritis and  multiple  areas of joint pain.   VASCULAR LAB:  The left peak systolic velocity in the internal carotid  artery was 290, and the end diastolic was 69, which is up from 26 on her  previous study 6 months ago.  This still suggests 60% to 79% stenosis,  and she has bilateral external carotid artery stenosis.   MEDICATIONS:  Diovan/Norvasc combination, amitriptyline, Celebrex,  allopurinol, Lasix 10 mg every other day, multivitamins, vitamin C,  calcium and vitamin D, fish oil.  She is presently taking 4 mg of  Coumadin per day.   PHYSICAL EXAMINATION:  This is a well-developed, well-nourished woman in  no acute distress.  Her heart rate was 66.  Her saturations were 95%,  and her respiratory rate was 12.  Neurologically, she had no focal  weakness or paresthesias.  She had good and equal strength in bilateral  upper and lower extremities.  She had no tongue deviation.  No facial  droop.   ASSESSMENT:  Stable left internal carotid artery stenosis at 60% to 79%.  End-diastolic velocity is up from 26 to 69.  She is asymptomatic from  the left carotid disease.   Wray Kearns, PA-C   Nelda Severe. Kellie Simmering, M.D.  Electronically Signed  RR/MEDQ  D:  07/18/2009  T:  07/18/2009  Job:  [id number]

## 2010-06-26 NOTE — Procedures (Signed)
CAROTID DUPLEX EXAM   INDICATION:  Followup carotid disease, known right ICA occlusion.   HISTORY:  Diabetes:  No.  Cardiac:  No.  Hypertension:  Yes.  Smoking:  Previous.  Previous Surgery:  No.  CV History:  Currently asymptomatic.  Amaurosis Fugax No, Paresthesias No, Hemiparesis No                                       RIGHT             LEFT  Brachial systolic pressure:  Brachial Doppler waveforms:  Vertebral direction of flow:                          Antegrade  DUPLEX VELOCITIES (cm/sec)  CCA peak systolic                                     65  ECA peak systolic                                     597  ICA peak systolic                                     416  ICA end diastolic                                     38  PLAQUE MORPHOLOGY:                                    Heterogeneous  PLAQUE AMOUNT:                                        Mild/moderate  PLAQUE LOCATION:                                      ECA/distal CCA   IMPRESSION:  1. Doppler velocity suggests a high end 40%-59% stenosis of the left      proximal internal carotid artery, however, there is no significant      plaque formation noted within the internal carotid artery.  This      increase in the left internal carotid artery velocity is most      likely due to the angle of vessel takeoff, vessel tortuosity and      compensatory flow from a known right internal carotid artery      occlusion.  2. Left external carotid artery stenosis noted.  3. No significant change in the left internal carotid artery Doppler      velocities noted when compared to the previous exams.   ___________________________________________  Rosetta Posner, M.D.   CH/MEDQ  D:  01/30/2010  T:  01/30/2010  Job:  [id number]

## 2010-06-29 NOTE — Op Note (Signed)
[name], [name]                 ACCOUNT NO.:  [id number]   MEDICAL RECORD NO.:  [id number]          PATIENT TYPE:  AMB   LOCATION:  [facility]                         FACILITY:  [facility]   PHYSICIAN:  Tarri Glenn, M.D.  DATE OF BIRTH:  Jul 27, 1934   DATE OF PROCEDURE:  07/26/2005  DATE OF DISCHARGE:                                 OPERATIVE REPORT   PREOP DIAGNOSES:  1.  Torn lateral meniscus.  2.  Osteoarthritis right knee.   POSTOP DIAGNOSES:  1.  Torn lateral meniscus.  2.  Abrasion injury to anterior third medial meniscus.  3.  Osteoarthritis.  4.  Medial shelf plica.   OPERATIONS:  1.  Right knee arthroscopy with partial lateral meniscectomy.  2.  Shaving of medial meniscus.  3.  Debridement of medial femoral condyle, lateral femoral condyle, lateral      tibial plateau, and patella.  4.  Excision of medial shelf plica.   SURGEON:  Tarri Glenn, M.D.   ASSISTANT:  None.   ANESTHESIA:  General.   INDICATIONS FOR PROCEDURE:  She has had progressive pain over the last few  months over the lateral right knee with an MRI on 06/20/2005 demonstrating a  torn lateral meniscus associated with osteoarthritis.  Accordingly, she is  here, today, for the above-mentioned surgery--see operative description  below for additional details.   DESCRIPTION OF PROCEDURE:  Prophylactic antibiotics.  She has a total hip  replacement on the right side, great care was taken to protect it.  Under  satisfactory gastroenteritis anesthesia a pneumatic tourniquet applied and  her right leg was Esmarched out nonsterilely and tourniquet inflated to 350  mmHg.  A thigh stabilizer applied.  Knee support and an Ace wrap to the left  lower extremity.  Right leg was prepped from thigh stabilizer to ankle,  draped in a sterile field.  Superior medial saline inflow first through the  anterolateral portal and medial compartment of the knee joint was evaluated.  She had grade 2-3/4 chondromalacia of the  medial femoral condyle which I  shaved down till smooth.  There was a fairly significant abrasion  injury/tear of the anterior weightbearing portion of medial meniscus which I  shaved down until smooth.  The posterior medial meniscus appeared to be  normal; and a representative picture was taken.  I then looked up the medial  gutter and suprapatellar area.  A plica was noted.  She also had significant  wear of the patellar surface with a divot vertical in line with the femur  and the tibia down the middle.  I cut the plica with scissors and removed it  with the 3.5 shaver and shaved the patella with the same.  Final pictures  were taken.  I then reversed portals, laterally.  She had severe maceration  and laceration of her lateral meniscus from the anterior portion all way  back to the junction of the mid posterior third.  This was associated with  some grade 2/4 chondromalacia of both the lateral tibial plateau and lateral  femoral condyle.  I excised the torn portion of  the lateral meniscus with a  combination of scissors, baskets, and 3.5 shaver.   CONCLUSION:  After removing all the torn meniscus.  She had very little  meniscus remaining in the anterior two-thirds with the posterior third  completely intact.  I also minimally debrided down the lateral femoral  condyle and lateral tibial plateau.  Her knee joint was then irrigated until  clear and all fluid possible was removed.  The transverse portals were  closed with 4-0 nylon, 20 mL, 1/2% Marcaine with adrenalin and 4 mg of  morphine were then instilled through the inflow apparatus which was removed;  and this portal closed with 4-0 nylon as well.  Betadine Adaptic dressings  were applied.  Tourniquet was released.  She tolerated the procedure well.  At the time of this dictation she was on her way to the recovery room in  satisfactory condition with no known complications.           ______________________________  Tarri Glenn, M.D.     JA/MEDQ  D:  07/26/2005  T:  07/26/2005  Job:  [id number]

## 2010-06-29 NOTE — Op Note (Signed)
NAME:  [name], [name]                           ACCOUNT NO.:  [id number]   MEDICAL RECORD NO.:  [id number]                   PATIENT TYPE:  INP   LOCATION:  0002                                 FACILITY:  [facility]   PHYSICIAN:  Tarri Glenn, M.D.               DATE OF BIRTH:  [birth year]   DATE OF PROCEDURE:  12/08/2002  DATE OF DISCHARGE:                                 OPERATIVE REPORT   PREOPERATIVE DIAGNOSIS:  Degenerative arthritis, right hip.   POSTOPERATIVE DIAGNOSIS:  Degenerative arthritis, right hip.   OPERATION:  Osteonics total hip replacement, right.   SURGEON:  Tarri Glenn, M.D.   ASSISTANT:  Gaynelle Arabian, M.D.   ANESTHESIA:  Spinal.   PATHOLOGY AND JUSTIFICATION FOR PROCEDURE:  Significant progressive pain,  right hip, with progressive loss of joint space.   DESCRIPTION OF PROCEDURE:  Prophylactic antibiotics.  Satisfactory spinal  anesthesia, Foley catheter inserted, left lateral decubitus position on the  Markham II frame.  The right hip was prepped with Duraprep, draped in a sterile  field.  Ioban employed.  Posterolateral incision down through fascia lata,  Zickel's band was cut.  External rotators were detached from the femur with  cutting cautery.  Gluteus medius was retracted.  Subtotal capsulectomy was  performed and the hip dislocated and the femoral neck cut just below the  femoral head with a power saw.  I then cleared the piriformis fossa of soft  tissue and began the cylindrical reaming up to a 9 and along the way used  the greater trochanteric reamer and also made my final neck cut a  fingerbreadth above the lesser trochanter.  Then began rasping and we were  able to work up to what was templated at a 9.  Then reamed for a 15 mm tip  with a 15.5 reamer.  Then completed the capsulectomy of the acetabulum and  began the deepening and expanding reaming process, working up to a 52 cup.  The trial cup was unstable, so we tentatively placed the final  cup in the  desired position, used the Charnley positioner, and felt that the cup was in  the position we desired.  I then fixed it with two superiorly-placed screws,  which were individually drilled, measured at 30 mm, and screwed.  Then went  through a trial reduction with the scribe line for the cup, which is 10  degrees at 11 o'clock, and the hip was nice and stable.  After irrigating  the femoral canal, I then placed the final #9 SecureFit Plus porous-coated  stem, impacted it in gently, and then we went through another trial  reduction, finding a -5 neck was the proper length.  We used the 32 mm C-  tapered head, reducing the hip, and it was stable in all directions.  The  wound was then closed with interrupted #1 Vicryl in Zickel's band and fascia  lata  and the deep subcutaneous tissue, 2-0 Vicryl in the superficial  subcutaneous tissue, and subcuticular closure with absorbable suture and  Steri-Strips on the skin.  A dry sterile dressing was applied.  She was  gently placed on her back in an abduction pillow and taken to the recovery  room in satisfactory condition with no known complications.                                              Tarri Glenn, M.D.   JA/MEDQ  D:  12/08/2002  T:  12/08/2002  Job:  [id number]

## 2010-06-29 NOTE — Discharge Summary (Signed)
[name], [name]                             ACCOUNT NO.:  [id number]   MEDICAL RECORD NO.:  [id number]                   PATIENT TYPE:   LOCATION:                                       FACILITY:   PHYSICIAN:  Tarri Glenn, M.D.               DATE OF BIRTH:   DATE OF ADMISSION:  11/23/2002  DATE OF DISCHARGE:  12/13/2002                                 DISCHARGE SUMMARY   ADMITTING DIAGNOSES:  1. 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  2. Sleep apnea.  3. Crohn's disease.  4. Hypertension.  5. Osteoarthritis.   DISCHARGE DIAGNOSES:  1. 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  2. Sleep apnea.  3. Crohn's disease.  4. Hypertension.  5. Osteoarthritis.  6. Postoperative anemia.  7. Postoperative hypotension (resolved).  8. Hypokalemia (resolved).   CONSULTS:  [facility] hospitalists, Aquilla Hacker, M.D. and Dr. Caryn Section   OPERATION:  On December 08, 2002 the patient underwent Osteonics total hip  replacement arthroplasty to the right hip for degenerative arthritis and  avascular necrosis.  Gaynelle Arabian, M.D. was the assistant.  Both components  press-fitted.   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 for progressive problems on the right hip.  She has  progressive changes both in her physical activity as well as her increased  pain and x-rays show avascular necrosis right femoral head with degenerative  changes and near collapse.  It is felt the patient would be in benefit with  the above procedure and is admitted for same.   HOSPITAL COURSE:  The patient tolerated surgical procedure quite well.  However, the cumulation of the perioperative medications as well as her  spinal anesthesia and taking (as instructed) her blood pressure medicine  preoperatively, she had an episode of hypotension.  Her pressure dropped to  96/40, then 97/36, 98/40 over an 8-hour period.   We called a consult, Lynnell Chad. Shelia Media, M.D. office.  Lynnell Chad. Shelia Media, M.D. and his associates now use the  [facility] hospitalists.  Eventually contacted Aquilla Hacker, M.D. and he  came to consult on the patient due to her postoperative hypotension.  He  changed her medications accordingly, gave her Toprol.  Discovered the  hypokalemia as well as hypomagnesemia.  Her morphine was changed to Dilaudid  as well.  Central line had to be placed for IV access due to the friability  of the patient's veins.  Eventually, her blood pressure came back to more  normal under Aquilla Hacker, M.D. and Dr. Maralyn Sago treatment.  She became  normotensive at that time.  We would say that her hypotensive episode lasted  for better part of afternoon/evening.  This was on October 27.  We felt that  the patient with her spinal anesthesia needed to lay completely flat as well  as her hypotension.  This delayed her beginning the therapy for total hip  protocol by at least one day plus.   The patient had postoperative anemia.  This also was followed very closely.  She stabilized in the 8.8-8.4 region.  Was asymptomatic, awake and alert at  all times.  With mutual agreement with the patient and Tarri Glenn,  M.D., no transfusion was done but we did have the patient on Trinsicon  t.i.d. as an iron supplement.  We also added Multivite.   The Hypafix that was used postoperatively for the postoperative dressing  caused a traction blister (tape blister) to the right buttocks area.  Skin  team was called and we used Tegaderm to cover this both for comfort as well  as protection.   Her wound remained intact postoperatively.  However, it did have moderate  amount of serous drainage.  We therefore are going to cover this with  Keflex.   On the day of discharge the patient's temperature was 99.4.  Her hemoglobin  had stabilized at 8.4.  INR was 2.4.  Blood pressure was 120/80.  Dr. Caryn Section  will see the patient  prior to discharge, giving her home instructions as  well as instructions on the medication.   For the patient's safety she will need transportation home by ambulance.  There are stairs that lead into her home and there is essentially no one  other than her husband to get her automobile to the house.  As of this  dictation she is yet to have any training on stairs and can only ambulate  about 12 feet.  In order to prevent falls/dislocation/other patient injury,  we feel that transportation into the home via ambulance with help is  indicated.   Durable medical equipment is supplied to the patient as well as purchased by  the husband for home use.   Day of discharge the patient is cheerful, anxious to go home.   LABORATORIES:  Preoperative CBC complete within normal limits.  Hemoglobin  12.6, hematocrit 37.1.  Her final hemoglobin was 8.4 with hematocrit 25.0.  Final potassium was 3.3.  Sodium was 138.  CK was 402 and 381, respectively.  CK-MB was negative at 1.5 and 1.0.  Relative index was 0.4 and 0.3.  No  indication of myocardial injury x2.  Magnesium was low postoperatively at  1.2.  This was treated by Aquilla Hacker, M.D. and it was normal at 1.7 at  discharge.  Preoperative urinalysis showed urinary tract infection.  We  treated this preoperatively with Cipro and repeat urinalysis done on December 08, 2002 was completely negative.  Blood type was A+.  Chest x-ray done on  October 18 shows generalized peribronchial thickening probably associated  with mild chronic bronchitis without active disease nor significant interval  change with previous films.  Electrocardiogram showed consistently normal  sinus rhythm.   CONDITION ON DISCHARGE:  Improved, stable.   PLAN:  The patient is discharged to her home in the care of her husband as  well as [facility]  She is to adhere to total hip protocols at home.  Dr. Caryn Section will see the patient prior to discharge recommending  her  home medications.  She is to follow up with Lynnell Chad. Shelia Media, M.D. for their  instructions.  Use dry dressing to the right hip daily.  Use Tegaderm to the  right hip as needed.  She is to continue nonweightbearing on the right lower  extremity.   DISCHARGE MEDICATIONS:  1. She is to continue with her home medication and diet per Dr.     Pattricia Boss D. Pharr, M.D.  2. She is given Percocet #50 one to two q.4-6h. p.r.n. pain.  3. Skelaxin 800 mg #30 with two refills one q.6h. p.r.n. muscle spasm.  4. Trinsicon #60 one b.i.d.  5. Keflex 500 mg 130 one b.i.d. until drainage stops from the wound.  6. Coumadin per pharmacy x6 weeks.   DISCHARGE INSTRUCTIONS:  She may shower only when the drain stops and the  blister on the right buttocks heals.  Return to see Tarri Glenn, M.D. in  about two weeks or so after the date of surgery.     Dooley L. Vanita Ingles.                 Tarri Glenn, M.D.    DLU/MEDQ  D:  12/13/2002  T:  12/13/2002  Job:  [id number]   cc:   Lynnell Chad. Shelia Media, M.D.  [address] [place]  [place] [address]  Fax: [phone]   Darlin Coco, M.D.  [address] [place] [address]  Fax: [phone]   Rosetta Posner, M.D.  [address]  [place]  [place] [address]

## 2010-06-29 NOTE — H&P (Signed)
NAME:  [name], [name]                           ACCOUNT NO.:  [id number]   MEDICAL RECORD NO.:  [id number]                   PATIENT TYPE:  INP   LOCATION:  NA                                   FACILITY:  [facility]   PHYSICIAN:  Tarri Glenn, M.D.               DATE OF BIRTH:  August 05, 1934   DATE OF ADMISSION:  12/08/2002  DATE OF DISCHARGE:                                HISTORY & PHYSICAL   CHIEF COMPLAINT:  Pain in my right hip.   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 Tarri Glenn for continuing progressive  problems concerning her right hip.  She has had related back problems which  have been relieved with epidural steroid injections.  She now has continuing  problems concerning the right hip and has an extremely antalgic gait,  difficulty changing positions, specifically from a sitting to a standing  position, and now has to use a cane in her opposite hand for ambulation.  She has a history of need for steroid therapy in the past secondary to  Crohn's disease.  X-rays as well as MRI show avascular necrosis of the right  femoral head.  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  She has been cleared preoperatively  medically by Dr. Thayer Jew . Pharr of [facility], by Dr.  Curt Jews of [facility] and by Dr.  Darlin Coco as far as Cardiology is concerned.   Preoperatively, she had an urinary tract infection which is picked up on her  preop laboratories.  This has been treated with Cipro 500 mg, one b.i.d.   PAST MEDICAL HISTORY:  1. History of sleep apnea.  She uses a CPAP machine.  2. Crohn's disease.  3. Hypertension.  4. Osteoarthritis.  5. Hiatal hernia is  also in her history.  6. She has been followed by Dr. Donnetta Hutching for right coronary artery occlusion     disease.   PAST SURGICAL HISTORY:  1. Hysterectomy in 1970.  2. Cholecystectomy in [place].  3. Marshall-Marchetti in [place].  4. Bilateral Morton's neuroma in 2001.  5. She had a cardiac catheterization x2.   CURRENT MEDICATIONS:  1. Aspirin 80 mg, one daily (was stopped five days prior to surgery).  2. Multivitamin without iron, one daily.  3. Fish oil three daily.  4. Asacol 400 mg, two b.i.d.  5. Diovan 320 mg daily.  6. Indapamide 2.5 mg daily.  7. Toprol XL 50 mg daily.  8. Allopurinol 300 mg daily.  9. Norvasc 5 mg daily.  10.      Zantac 300 mg at bedtime.  11.  Cosamin DS, one daily.  12.      Celebrex 200 mg daily (stopped five days prior to surgery).  13.      Vitamin C 500 mg time release, one daily.   ALLERGIES:  She is allergic to TETANUS TOXOID and PENICILLIN.   FAMILY HISTORY:  Heart disease in the father, had a MI at age 78 but lived  until 66.  Both mother and father did have hypertension.  Mother died at age  86.  Diabetes in paternal grandfather.   SOCIAL HISTORY:  The patient is married.  She is a Equities trader, has no  intake of tobacco products, occasional intake of alcohol.  She has one  child.  Her husband, Timmothy Sours, and child as well as Arville Go will be caregivers  after surgery.  She lives in an one floor home, two steps to the front door,  six at the back.   REVIEW OF SYSTEMS:  CENTRAL NERVOUS SYSTEM:  No seizure disorder, paralysis,  numbness, or double vision.  She does have some residual sensory loss  secondary to bilateral Morton's neuromas in the toes and forefoot.  CARDIOVASCULAR:  No chest pain, no angina, no orthopnea.  RESPIRATORY:  No  productive cough, no hemoptysis, no shortness of breath, but she does have  sleep apnea.  GASTROINTESTINAL:  Currently no nausea, vomiting, melena, or  bloody stool.  GENITOURINARY:  No discharge or hematuria.   MUSCULOSKELETAL:  Primarily in present illness with the right hip.  She does have some mild  pan joint pain.   PHYSICAL EXAMINATION:  GENERAL:  Alert, cooperative, friendly 76 year old  white female.  VITAL SIGNS:  Blood pressure 150/88, pulse 72, respirations 12.  HEENT:  Normocephalic.  PERRLA.  Oropharynx is clear.  CHEST:  Clear to auscultation; no rhonchi, no rales.  HEART:  Regular rate and rhythm.  No murmurs are heard.  ABDOMEN:  Slightly obese, soft, nontender.  Liver, spleen not felt.  GENITALIA/RECTAL/PELVIC/BREASTS:  Not done.  Not pertinent to present  illness.  EXTREMITIES:  Extremely painful range of motion to the right hip with  internal and external rotation.  Pedal pulses are intact.  No edema.   PLAN:  The patient will be admitted for right total hip replacement  arthroplasty.  Plan to have home health with [place].  She has requested not  to use an abduction pillow if necessary postoperatively.  Would prefer a  knee immobilizer.  She will bring her CPAP machine to the hospital to assist  in breathing at night.  Should we have any medical problems, we will  certainly call Dr. Shelia Media to work with [place] during her hospitalization as well  as Dr. Mare Ferrari and Dr. Donnetta Hutching should we have any need for their expertise.     Barbie Haggis, P.A.-C              Tarri Glenn, M.D.    DLU/MEDQ  D:  12/02/2002  T:  12/02/2002  Job:  [id number]   cc:   Lynnell Chad. Shelia Media, M.D.  [place]  [place] [address]  Fax: [phone]   Darlin Coco, M.D.  [address], [place] [address]  Fax: [phone]   Rosetta Posner, M.D.  [address]  [place]  [place] [address]

## 2010-06-29 NOTE — Op Note (Signed)
[facility]  Patient:    [name], [name]                        MRN: [id number] Proc. Date: 05/20/00 Adm. Date:  [id number] Attending:  Tarri Glenn Page                           Operative Report  PREOPERATIVE DIAGNOSIS:   Morton neuroma, second and third web space bilaterally.  POSTOPERATIVE DIAGNOSIS: Morton neuroma, second and third web space bilaterally.  OPERATION:  Excision of Morton neuroma, second and third web space bilaterally.  SURGEON:  Laurice Record. Aplington, M.D.  ASSISTANT:  Nurse.  ANESTHESIA:  General.  PATHOLOGY AND INDICATIONS FOR PROCEDURE:  Chronic pain in both forefeet with x-rays being unremarkable.  She has had localized point tenderness in the second and third web space which has been completely relieved temporarily with Xylocaine and steroid injection.  Because of the chronicity of the problem and the fact that she is having no long-term results with steroid injections, she is here today for excision of the neuromas.  On her preoperative physical examination, she also had some slight tenderness in the third and fourth web spaces today which was discussed with her.  DESCRIPTION OF PROCEDURE:  Both feet were done in an identical fashion. After satisfactory general anesthesia, pneumatic tourniquet, DuraPrep foot and ankle, draped in sterile field, dorsal web splitting incision between the second and third toes with blunt dissection.  I isolated the very scarred common digital nerve and grasped it with Allis clamp.  I then dissected out the nerve branches to the second and third toe which I cut with cutting cautery and then freed up the parent nerve bundle, going down in between the metatarsal heads, and I then cut the nerve as proximal to the metatarsal head as I could.  The only difference in the two feet was that in the left foot, the neuroma and scar came back as a fragmented portion because it did not have much inherent  tensile strength; whereas on the right it came out as a matted mass which is more characteristic.  After finishing with the left foot, I irrigated the wound well and cauterized potential deep layers with cautery and packed the area between metatarsal heads and the excision of the Morton neuroma with a small amount of Gelfoam, placed a tied 4 x 4 around the forefoot for compression, and then released the tourniquet.  When I finished with the right foot, I performed the identical maneuver and went back to the left where there was not a lot of bleeding.  The wound was infiltrated with 0.5% plain Marcaine.  The subcutaneous tissue was closed with a single 3-0 Vicryl and skin with simple and interrupted 4-0 nylon mattress sutures. Closure on the right foot was the same.  Both feet were dressed identically with Betadine, Adaptic, and a loose-fitting dry sterile dressing.  She tolerated the procedure well and was taken to the recovery room in satisfactory condition with no known complications. DD:  05/20/00 TD:  05/20/00 Job: [id number]

## 2010-06-29 NOTE — Discharge Summary (Signed)
[name], [name]                 ACCOUNT NO.:  [id number]   MEDICAL RECORD NO.:  [id number]          PATIENT TYPE:  INP   LOCATION:  [place]                         FACILITY:  [facility]   PHYSICIAN:  Tarri Glenn, M.D.  DATE OF BIRTH:  [birth year]   DATE OF ADMISSION:  10/29/2006  DATE OF DISCHARGE:  10/31/2006                               DISCHARGE SUMMARY   ADMITTING DIAGNOSES:  1. Spinal stenosis L3-L4, L4-5, and L5-S1.  2. Hypertension.  3. Gastroesophageal reflux disease.  4. Sleep apnea.   DISCHARGE DIAGNOSES:  1. Spinal stenosis L3-L4, L4-5, and L5-S1.  2. Hypertension.  3. Gastroesophageal reflux disease.  4. Sleep apnea.  5. Mild postoperative anemia.   OPERATION:  On October 29, 2006, the patient underwent central and  foraminal decompression L3-S1 with diskectomies L4-5, l5-S1 on the right  [name], M.D. assisted.   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  She had  treatment of her knee with Hyalgan injections, but her knee pain and  back pain persisted.  She is a very active lady, gets about quite well,  and this present illness is markedly interfering with her daily  activities.  MRI had shown the above stenotic changes in the lumbar  spine.  After much discussion including the risks and benefits of  surgery, the patient was admitted for the above procedure.   COURSE IN THE HOSPITAL:  The patient tolerated the surgical procedure  quite well.  She had only mild discomfort and burning into her lumbar  spine.  Her leg pain was markedly relieved after her surgery.  She was  ambulating in the room with a walker for assistance, going to the  bathroom independently.  Her husband Timmothy Sours was with her the majority of  the time.  The dressing was changed.  The wound was seen to be dry and  intact, and it was  felt that she could be maintained in her home  environment with [place] for home health.  She was anxious to go home  and we certainly complied since she had improved markedly after the  surgery.   LABORATORY VALUES:  In the hospital hematologically, showed admitting  hemoglobin/hematocrit of 14.0 and 40 respectively.  Final hemoglobin was  10.8.  Blood chemistries were normal other than elevated glucose at 186  preop and 151 postop.  Chest x-ray showed a stable cardiopulmonary  appearance with no acute disease.  No electrocardiogram seen on this  chart.   CONDITION ON DISCHARGE:  Improved and stable.   PLAN:  The patient was discharged to her home in the care of her family  with home health.  She was given Darvocet for discomfort and Robaxin as  a muscle relaxant.   OTHER MEDICATIONS:  1. Omacor 1 g three tablets b.i.d.  2. Norvasc 5 mg one q.a.m.  3. Toprol XL 50 mg q.a.m.  4. Allopurinol 30 mg q.a.m.  5. Diovan 320 mg  q.a.m.  6. Lozol 2.5 mg q.a.m.  7. Zantac 300 mg nightly.  8. Relafen 750 mg b.i.d.  9. Multivitamin.  10.Glucosamine.  11.Aspirin 81 mg daily.  She will continue with those medications.   CONDITION ON DISCHARGE:  Improved and stable.  She will return to the  office about 2 weeks after the date of surgery.  She is encouraged to  call should she have any problems or questions.  She may use dry  dressing to the back p.r.n.,  may take a shower 4 days after date of  surgery.      Dooley L. Vanita Ingles.    ______________________________  Tarri Glenn, M.D.    DLU/MEDQ  D:  11/06/2006  T:  11/07/2006  Job:  [id number]

## 2010-06-29 NOTE — Consult Note (Signed)
NAME:  [name], [name]                           ACCOUNT NO.:  [id number]   MEDICAL RECORD NO.:  [id number]                   PATIENT TYPE:  INP   LOCATION:  5681                                 FACILITY:  [facility]   PHYSICIAN:  Aquilla Hacker, M.D.               DATE OF BIRTH:  08/22/1934   DATE OF CONSULTATION:  12/08/2002  DATE OF DISCHARGE:                                   CONSULTATION   REASON FOR CONSULTATION:  Hypotension.   HISTORY AND CHIEF COMPLAINT:  The patient was admitted today for right hip  arthroplasty with the placement of a new hip with the advanced degenerative  osteoarthritis.  The patient had subsequently underwent spinal anesthesia  and a total hip replacement and did well.  However, postoperatively she had  significant hypotension and her blood pressure was 87/33, and 93/43.  At  that time, the P.A. of Dr. Tarri Glenn told me with a consult, Mr. Bobbye Morton.   PAST MEDICAL HISTORY:  1. Sleep apnea.  Uses CPAP.  2. History of Crohn's disease.  3. Hypertension.  4. Degenerative osteoarthritis.  5. Hiatal hernia.  6. She has been seen by Dr. Donnetta Hutching with the history of right coronary artery     occlusion.   PAST SURGICAL HISTORY:  1. Hysterectomy in 1970.  2. Cholecystectomy in [place].  3. Marshall-Marchetti in [place].  4. Bilateral Morton's neuroma in 2001.  5. Cardiac catheterization twice.   MEDICATIONS:  1. Aspirin 81 mg once a day.  2. Multivitamin with iron daily.  3. Fish oil three times a day.  4. Asacol 400 mg p.o. b.i.d.  5. Diovan 320 mg q. daily.  6. Indapamide 2.5 mg daily.  7. Toprol XL 50 mg q. daily.  8. Allopurinol 300 mg q. daily.  9. Norvasc 5 mg q. daily.  10.      Zantac 300 mg q.h.s.  11.      Cosamin DS tablet once a day.  12.      Celebrex 200 mg, stopped five days prior to admission.  13.      Vitamin C 500 mg q. daily.   The patient took her medications prior to the surgery.    ALLERGIES:  1. TETANUS TOXOID.  2.  PENICILLIN.   FAMILY HISTORY:  History of heart disease in her father who had it at the  age of 48, lived up to 37.  Both parents had hypertension.  Her mother died  at the age of 16.  She had a diabetic in the paternal grandfather.   SOCIAL HISTORY:  She is married.  No alcohol intake and no smoking.  She had  one grown up child.  Her husband is the caregiver.   REVIEW OF SYSTEMS:  Mainly feeling ill or sick postoperatively.  Nauseated.  No cough or expectoration.  GASTROINTESTINAL:  No vomiting, no diarrhea.  CARDIOVASCULAR:  No palpitations, no chest pain.  GENITOURINARY:  No  hematuria.  MUSCULOSKELETAL:  Status post right hip arthroplasty.   PHYSICAL EXAMINATION:  VITAL SIGNS:  Blood pressure was ranging between  87/37 to 120/58.  The pulse was 93, temperature is 97.8 and afebrile.  HEENT:  The head was normocephalic, atraumatic.  Pupils were equal and  reactive.  Conjunctivae were pink.  Sclerae were nonicteric.  Oropharynx was  negative.  NECK:  Supple, no JVD, no thyromegaly, no lymphadenopathy.  Trachea midline.  CHEST:  Clear to auscultation and percussion.  HEART:  PMI in the fifth intercostal space outside midclavicular line.  Normal S1 and S2, no S3, no S4, no gallop.  ABDOMEN:  Soft, positive bowel sounds.  However, she had mild increased  bowel sounds and mild distention.  EXTREMITIES:  She has a right hip arthroplasty, otherwise, pulses pulsatile  and symmetrical.  No pitting edema.   IMPRESSION:  1. Postoperative hypotension, probably secondary to the spinal anesthesia     and preoperative antihypertensive medications.  2. Underlying history of coronary artery disease.  3. Underlying history of hypertension, hypotensive at this time.  4. Hiatal hernia.  5. Sleep apnea.  6. Crohn's disease.   RECOMMENDATIONS AND PLAN:  The patient to be started on intravenous normal  saline D5 0.9 normal saline with 20 of KCL, and will be giving a bolus of  300 ml, a bolus by  100 cc per hour, and check on her CMET in the a.m. and  CBC with differential in the a.m., and hold all blood pressure medications  if blood pressure 120 or below.  Clear liquid diet.  EKG, as well as  urinalysis with culture and sensitivity.  The patient was conscious and able  to communicate;  however, I&O will be very essential.  The patient is also  on Lovenox at this time for prophylaxis.  Continue the pain control with  morphine sulfate or Dilaudid per the orthopaedic;  however, clear liquid  diet which is important tonight to continue.  Tomorrow if there is no nausea  and vomiting she will be able to eat as well as if the blood pressure to  pick up, then at that time we will be starting her medications.                                               Aquilla Hacker, M.D.    Marcellina Millin  D:  12/08/2002  T:  12/08/2002  Job:  [id number]

## 2010-07-10 ENCOUNTER — Encounter: Payer: Self-pay | Admitting: Internal Medicine

## 2010-07-11 ENCOUNTER — Ambulatory Visit: Payer: Self-pay | Admitting: Cardiology

## 2010-07-31 ENCOUNTER — Encounter: Payer: Self-pay | Admitting: *Deleted

## 2010-08-06 ENCOUNTER — Encounter: Payer: Self-pay | Admitting: Cardiology

## 2010-08-06 ENCOUNTER — Ambulatory Visit (INDEPENDENT_AMBULATORY_CARE_PROVIDER_SITE_OTHER): Payer: Medicare Other | Admitting: Cardiology

## 2010-08-06 ENCOUNTER — Ambulatory Visit (INDEPENDENT_AMBULATORY_CARE_PROVIDER_SITE_OTHER): Payer: Medicare Other | Admitting: *Deleted

## 2010-08-06 DIAGNOSIS — IMO0001 Reserved for inherently not codable concepts without codable children: Secondary | ICD-10-CM

## 2010-08-06 DIAGNOSIS — M797 Fibromyalgia: Secondary | ICD-10-CM | POA: Insufficient documentation

## 2010-08-06 DIAGNOSIS — E669 Obesity, unspecified: Secondary | ICD-10-CM

## 2010-08-06 DIAGNOSIS — I6529 Occlusion and stenosis of unspecified carotid artery: Secondary | ICD-10-CM

## 2010-08-06 DIAGNOSIS — E6609 Other obesity due to excess calories: Secondary | ICD-10-CM

## 2010-08-06 DIAGNOSIS — I119 Hypertensive heart disease without heart failure: Secondary | ICD-10-CM

## 2010-08-06 DIAGNOSIS — I2699 Other pulmonary embolism without acute cor pulmonale: Secondary | ICD-10-CM

## 2010-08-06 DIAGNOSIS — M109 Gout, unspecified: Secondary | ICD-10-CM | POA: Insufficient documentation

## 2010-08-06 DIAGNOSIS — I6521 Occlusion and stenosis of right carotid artery: Secondary | ICD-10-CM | POA: Insufficient documentation

## 2010-08-06 DIAGNOSIS — E785 Hyperlipidemia, unspecified: Secondary | ICD-10-CM | POA: Insufficient documentation

## 2010-08-06 NOTE — Assessment & Plan Note (Signed)
Patient has a past history of essential hypertension.  Her blood pressures have been remaining in the satisfactory range on her current therapy.  She denies any dizziness or syncope or chest pain or palpitations.

## 2010-08-06 NOTE — Assessment & Plan Note (Addendum)
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  She has not been experiencing any TIA symptoms.She is not on Plavix

## 2010-08-06 NOTE — Assessment & Plan Note (Addendum)
The patient has a history of fibromyalgia.  On her last visit we gave her a trial of Cymbalta which made her very nauseated and she was unable to take it at all.  She is now on Savella which she is tolerating but is taking only once a day rather than twice a day.  She was unable to tolerate twice a day

## 2010-08-06 NOTE — Assessment & Plan Note (Signed)
The patient has a past history of pulmonary emboli.  In January 2011 she underwent hip surgery and subsequently had bilateral pulmonary emboli.  Venous Dopplers of her legs were negative.  It was presumed that the source of the emboli was her pelvis.  She is now on Coumadin.  She's been doing well.  She's had no evidence of phlebitis.  She had no recurrent pleuritic chest pain or palpitations.  She has not had any problem from the Coumadin therapy

## 2010-08-06 NOTE — Progress Notes (Signed)
[name] Date of Birth:  05/31/1934 [facility] / [facility] [address]   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  She has a complex past medical history.  She has a history of essential hypertension and a history of gout.  She also has a history of fibromyalgia.  She's had a problem with exogenous obesity.  She's had a lot of orthopedic problems.  She does not have any history of significant coronary disease and she had cardiac catheterization in 1994 which showed only mild plaque but no obstructive narrowings.  Recently she had a nuclear stress test 01/16/10 preoperatively before total knee replacement in January 2012 and she had no evidence of ischemia and her left ventricular ejection fraction was 78% to in January 2011 she was hospitalized for a fractured prosthetic hip and postoperatively had bilateral pulmonary emboli.  Venous Dopplers of her legs at that time were negative.  She has been on Coumadin since then.  Patient also has a history of known right carotid occlusion and she has a left carotid with 50% stenosis.  She has not been expressing any TIA symptoms.  His had a history of dyslipidemia and history of exogenous obesity.  She's had a difficult time losing weight.  She has been having problem with her shoulder and may need to have shoulder surgery.  Current Outpatient Prescriptions  Medication Sig Dispense Refill  . allopurinol (ZYLOPRIM) 300 MG tablet Take 1 tablet by mouth Daily.      [name] Kitchen amitriptyline (ELAVIL) 10 MG tablet Take 10 mg by mouth at bedtime.        [name] Kitchen amLODipine (NORVASC) 5 MG tablet Take 1 tablet by mouth Daily.      . Brimonidine Tartrate-Timolol (COMBIGAN OP) Apply 1 drop to eye 2 (two) times daily.        . Calcium Carbonate-Vitamin D (CALTRATE 600+D PO) Take 1 tablet by mouth daily.          . CELEBREX 200 MG capsule Take 1 capsule by mouth Daily.      [name] Kitchen DIOVAN 320 MG tablet Take 1 tablet by mouth Daily.      . fenofibrate 160 MG tablet Take 1 tablet by mouth Daily.      . furosemide (LASIX) 40 MG tablet Every other day prn for fluid  15 tablet  11  . HYDROcodone-acetaminophen (VICODIN) 5-500 MG per tablet Take 1 tablet by mouth Daily.      . Loratadine (CLARITIN PO) Take 1 tablet by mouth as needed.        . metoprolol tartrate (LOPRESSOR) 25 MG tablet Take 1 tablet by mouth Twice daily.      . Milnacipran HCl (SAVELLA) 25 MG TABS Take by mouth 2 (two) times daily.        [name] Kitchen omeprazole (PRILOSEC) 40 MG capsule Take 1 capsule by mouth Daily.      [name] Kitchen trimethoprim (TRIMPEX) 100 MG tablet Take 100 mg by mouth at bedtime and may repeat dose one time if needed.        . warfarin (COUMADIN) 2 MG tablet Take by mouth. Take as directed by the Coumadin Clinic      . DISCONTD: CHOLESTYRAMINE PO Take by mouth daily.        [name] Kitchen DISCONTD: methocarbamol (ROBAXIN) 500 MG  tablet Take 500 mg by mouth as needed.          Allergies  Allergen Reactions  . Ciprofloxacin   . Codeine   . Cymbalta (Duloxetine Hcl)     nausea  . Penicillins     Patient Active Problem List  Diagnoses  . Other pulmonary embolism and infarction  . Benign hypertensive heart disease without heart failure  . Fibromyalgia  . Dyslipidemia  . Gout  . Exogenous obesity  . Right carotid artery occlusion    History  Smoking status  . Former Smoker  Smokeless tobacco  . Not on file    History  Alcohol Use: Not on file    Family History  Problem Relation Age of Onset  . Heart disease Father   . Heart disease Mother   . Stroke Mother   . Hypertension Brother   . Hypertension Brother   . Heart attack Father 107  . Aneurysm Mother     Review of Systems: Constitutional: no fever chills diaphoresis or fatigue or change in weight.  Head and neck: no hearing loss, no epistaxis, no photophobia or visual  disturbance. Respiratory: No cough, shortness of breath or wheezing. Cardiovascular: No chest pain peripheral edema, palpitations. Gastrointestinal: No abdominal distention, no abdominal pain, no change in bowel habits hematochezia or melena. Genitourinary: No dysuria, no frequency, no urgency, no nocturia. Musculoskeletal:No arthralgias, no back pain, no gait disturbance or myalgias. Neurological: No dizziness, no headaches, no numbness, no seizures, no syncope, no weakness, no tremors. Hematologic: No lymphadenopathy, no easy bruising. Psychiatric: No confusion, no hallucinations, no sleep disturbance.    Physical Exam: Filed Vitals:   08/06/10 0926  BP: 140/70  Pulse: 68  The general appearance reveals a well-developed somewhat overweight woman in no acute distress.Pupils equal and reactive.   Extraocular Movements are full.  There is no scleral icterus.  The mouth and pharynx are normal.  The neck is supple.  The carotids reveal no bruits.  The jugular venous pressure is normal.  The thyroid is not enlarged.  There is no lymphadenopathy.The chest is clear to percussion and auscultation. There are no rales or rhonchi. Expansion of the chest is symmetrical.The precordium is quiet.  The first heart sound is normal.  The second heart sound is physiologically split.  There is no murmur gallop rub or click.  There is no abnormal lift or heave. The rhythm is regular,The abdomen is soft and nontender. Bowel sounds are normal. The liver and spleen are not enlarged. There Are no abdominal masses. There are no bruits. Normal extremity without phlebitis or edema.  Pedal pulses are present.Strength is normal and symmetrical in all extremities.  There is no lateralizing weakness.  There are no sensory deficits.The skin is warm and dry.  There is no rash.   Assessment / Plan: Continue same medication.  Work harder on weight loss recheck in 4 months for followup office visit and EKG.  We discussed duration  of therapy of her Coumadin and in view of her prior pulmonary emboli as well as her history of known carotid artery occlusion we will continue Coumadin and she is in agreement with that.

## 2010-08-27 ENCOUNTER — Ambulatory Visit (INDEPENDENT_AMBULATORY_CARE_PROVIDER_SITE_OTHER): Payer: Medicare Other | Admitting: *Deleted

## 2010-08-27 DIAGNOSIS — I2699 Other pulmonary embolism without acute cor pulmonale: Secondary | ICD-10-CM

## 2010-08-27 DIAGNOSIS — Z7901 Long term (current) use of anticoagulants: Secondary | ICD-10-CM

## 2010-08-27 LAB — POCT INR: INR: 1.8

## 2010-09-04 ENCOUNTER — Telehealth: Payer: Self-pay | Admitting: Cardiology

## 2010-09-04 NOTE — Telephone Encounter (Signed)
PT IS GOING TO HAVE INJECTIONS AND IS ON COUMADIN. WANTS TO SPEAK WITH MELINDA.

## 2010-09-04 NOTE — Telephone Encounter (Signed)
Needs epidural steroid injection.  Dr Nelva Bush wants to discontinue coumadin 3-5 days prior.  Is this ok? 4 mg  M, W, F and 2 mg other days.  Was ok with waiting until  Dr. Mare Ferrari back.

## 2010-09-09 NOTE — Telephone Encounter (Signed)
The patient is in normal sinus rhythm.  Okay to stop Coumadin 4 days prior to epidural steroid injection.  She does not need Lovenox bridging.

## 2010-09-10 ENCOUNTER — Telehealth: Payer: Self-pay | Admitting: Cardiology

## 2010-09-10 NOTE — Telephone Encounter (Signed)
Pt informed of Dr Sherryl Barters recommendation for her coumadin. Ok to be off of coumadin 4 days prior to injection. The nurse here will fax [place] a note Attention Dr Nelva Bush. Pt is scheduled for 8/21.

## 2010-09-13 ENCOUNTER — Encounter: Payer: Self-pay | Admitting: Cardiology

## 2010-09-16 ENCOUNTER — Other Ambulatory Visit: Payer: Self-pay | Admitting: Cardiology

## 2010-09-16 DIAGNOSIS — I2699 Other pulmonary embolism without acute cor pulmonale: Secondary | ICD-10-CM

## 2010-09-17 ENCOUNTER — Ambulatory Visit (INDEPENDENT_AMBULATORY_CARE_PROVIDER_SITE_OTHER): Payer: Medicare Other | Admitting: *Deleted

## 2010-09-17 DIAGNOSIS — I2699 Other pulmonary embolism without acute cor pulmonale: Secondary | ICD-10-CM

## 2010-09-17 DIAGNOSIS — Z7901 Long term (current) use of anticoagulants: Secondary | ICD-10-CM

## 2010-09-17 NOTE — Telephone Encounter (Signed)
escribe request  

## 2010-10-01 ENCOUNTER — Ambulatory Visit (INDEPENDENT_AMBULATORY_CARE_PROVIDER_SITE_OTHER): Payer: Medicare Other | Admitting: *Deleted

## 2010-10-01 ENCOUNTER — Encounter: Payer: Medicare Other | Admitting: *Deleted

## 2010-10-01 DIAGNOSIS — Z7901 Long term (current) use of anticoagulants: Secondary | ICD-10-CM

## 2010-10-01 DIAGNOSIS — I2699 Other pulmonary embolism without acute cor pulmonale: Secondary | ICD-10-CM

## 2010-10-09 ENCOUNTER — Ambulatory Visit (INDEPENDENT_AMBULATORY_CARE_PROVIDER_SITE_OTHER): Payer: Medicare Other | Admitting: *Deleted

## 2010-10-09 DIAGNOSIS — I2699 Other pulmonary embolism without acute cor pulmonale: Secondary | ICD-10-CM

## 2010-10-09 DIAGNOSIS — Z7901 Long term (current) use of anticoagulants: Secondary | ICD-10-CM

## 2010-11-06 ENCOUNTER — Ambulatory Visit (INDEPENDENT_AMBULATORY_CARE_PROVIDER_SITE_OTHER): Payer: Medicare Other | Admitting: *Deleted

## 2010-11-06 DIAGNOSIS — I2699 Other pulmonary embolism without acute cor pulmonale: Secondary | ICD-10-CM

## 2010-11-06 DIAGNOSIS — Z7901 Long term (current) use of anticoagulants: Secondary | ICD-10-CM

## 2010-11-06 LAB — POCT INR: INR: 2.1

## 2010-11-19 ENCOUNTER — Telehealth: Payer: Self-pay | Admitting: Cardiology

## 2010-11-19 NOTE — Telephone Encounter (Signed)
Pt calling re stopping coumadin prior to getting an injection

## 2010-11-19 NOTE — Telephone Encounter (Signed)
I spoke with pt who will be having an epidural injection.  She has had these in the past where her Coumadin was stopped 4 days prior. INR checked the day before the procedure, followed by immediate restart.  Then within a week after INR rechecked.   She wanted to know if the plan above would be the same.  Pt is aware Dr. Mare Ferrari is in the office tomorrow. She will await call back as epidural injection has not been scheduled.

## 2010-11-20 ENCOUNTER — Telehealth: Payer: Self-pay | Admitting: Cardiology

## 2010-11-20 NOTE — Telephone Encounter (Signed)
Discussed INR with patient and she will schedule procedure and call for INR appointment

## 2010-11-20 NOTE — Telephone Encounter (Signed)
I would recommend that she hold her Coumadin for 5 days prior to the spinal injection and then restart the Coumadin immediately after the procedure is completed

## 2010-11-20 NOTE — Telephone Encounter (Signed)
Advised patient

## 2010-11-20 NOTE — Telephone Encounter (Signed)
Please advise on coumadin

## 2010-11-20 NOTE — Telephone Encounter (Signed)
Pt returning call from [place]. Please call back.

## 2010-11-22 LAB — BASIC METABOLIC PANEL
BUN: 17
CO2: 22
Calcium: 8 — ABNORMAL LOW
Chloride: 106
Creatinine, Ser: 0.9
GFR calc Af Amer: >60
Glucose, Bld: 151 — ABNORMAL HIGH
Potassium: 3.2 — ABNORMAL LOW

## 2010-11-22 LAB — PROTIME-INR
INR: 1
Prothrombin Time: 13.4

## 2010-11-22 LAB — TYPE AND SCREEN
ABO/RH(D): A POS
Antibody Screen: NEGATIVE

## 2010-11-28 ENCOUNTER — Encounter: Payer: Self-pay | Admitting: Cardiology

## 2010-12-04 ENCOUNTER — Encounter: Payer: Medicare Other | Admitting: *Deleted

## 2010-12-10 ENCOUNTER — Ambulatory Visit (INDEPENDENT_AMBULATORY_CARE_PROVIDER_SITE_OTHER): Payer: Medicare Other | Admitting: *Deleted

## 2010-12-10 DIAGNOSIS — Z7901 Long term (current) use of anticoagulants: Secondary | ICD-10-CM

## 2010-12-10 DIAGNOSIS — I2699 Other pulmonary embolism without acute cor pulmonale: Secondary | ICD-10-CM

## 2010-12-18 ENCOUNTER — Other Ambulatory Visit: Payer: Self-pay | Admitting: Pharmacist

## 2010-12-18 ENCOUNTER — Ambulatory Visit (INDEPENDENT_AMBULATORY_CARE_PROVIDER_SITE_OTHER): Payer: Medicare Other | Admitting: *Deleted

## 2010-12-18 DIAGNOSIS — I2699 Other pulmonary embolism without acute cor pulmonale: Secondary | ICD-10-CM

## 2010-12-18 DIAGNOSIS — Z7901 Long term (current) use of anticoagulants: Secondary | ICD-10-CM

## 2010-12-18 LAB — POCT INR: INR: 1.7

## 2010-12-31 ENCOUNTER — Ambulatory Visit: Payer: Medicare Other | Admitting: Cardiology

## 2011-01-01 ENCOUNTER — Ambulatory Visit (INDEPENDENT_AMBULATORY_CARE_PROVIDER_SITE_OTHER): Payer: Medicare Other | Admitting: *Deleted

## 2011-01-01 DIAGNOSIS — Z7901 Long term (current) use of anticoagulants: Secondary | ICD-10-CM

## 2011-01-01 DIAGNOSIS — I2699 Other pulmonary embolism without acute cor pulmonale: Secondary | ICD-10-CM

## 2011-01-08 ENCOUNTER — Ambulatory Visit (INDEPENDENT_AMBULATORY_CARE_PROVIDER_SITE_OTHER): Payer: Medicare Other | Admitting: Cardiology

## 2011-01-08 ENCOUNTER — Encounter: Payer: Self-pay | Admitting: Cardiology

## 2011-01-08 VITALS — BP 120/80 | HR 60 | Ht 62.0 in | Wt 234.8 lb

## 2011-01-08 DIAGNOSIS — I119 Hypertensive heart disease without heart failure: Secondary | ICD-10-CM

## 2011-01-08 DIAGNOSIS — M1990 Unspecified osteoarthritis, unspecified site: Secondary | ICD-10-CM | POA: Insufficient documentation

## 2011-01-08 DIAGNOSIS — I2699 Other pulmonary embolism without acute cor pulmonale: Secondary | ICD-10-CM

## 2011-01-08 NOTE — Assessment & Plan Note (Signed)
The patient has not been having any pleurisy or chest pain to suggest recurrent pulmonary emboli.  She has had good checkups with her warfarin.

## 2011-01-08 NOTE — Progress Notes (Signed)
[name] Date of Birth:  [birth year] [facility] / [facility] [address]   [place], [place]  [address] [phone]           Fax   [phone]  History of Present Illness: This pleasant 76 year old woman is seen back for a four-month followup office visit.  She has a past history of pulmonary emboli which occurred in the setting of postoperative hip operation.  Venous Dopplers of her legs at that time were negative.  That was in January 2011.  She has been on long-term Coumadin since then.  She also has known vascular disease with a total occlusion of her right carotid artery and the left carotid has a 50% stenosis.  She has not been having any TIA symptoms.  He denies any chest pain or angina and she had a cardiac catheterization in 1994 which showed only mild plaque.  Her last nuclear stress test preoperatively before total knee replacement was in December 2011 and showed no ischemia and her left ventricular ejection fraction was 78 percent.  Current Outpatient Prescriptions  Medication Sig Dispense Refill  . allopurinol (ZYLOPRIM) 300 MG tablet Take 1 tablet by mouth Daily.      [name] Kitchen amitriptyline (ELAVIL) 10 MG tablet Take 10 mg by mouth at bedtime.        [name] Kitchen amLODipine (NORVASC) 5 MG tablet Take 1 tablet by mouth Daily.      . Brimonidine Tartrate-Timolol (COMBIGAN OP) Apply 1 drop to eye 2 (two) times daily.        . Calcium Carbonate-Vitamin D (CALTRATE 600+D PO) Take 1 tablet by mouth daily.        . CELEBREX 200 MG capsule Take 1 capsule by mouth Daily.      [name] Kitchen COUMADIN 2 MG tablet TAKE 2 TABLETS ON TUES, THURS AND SAT AND THEN 1 TABLET ON OTHER DAYSOR AS DIRECTED.  45 each  3  . fenofibrate 160 MG tablet Take 1 tablet by mouth Daily.      . furosemide (LASIX) 40 MG tablet Every other day prn for fluid  15 tablet  11  . HYDROcodone-acetaminophen (VICODIN) 5-500 MG per tablet Take 1 tablet by mouth Daily.      . indapamide (LOZOL) 2.5 MG tablet Take 2.5 mg  by mouth Daily.      . Loratadine (CLARITIN PO) Take 1 tablet by mouth as needed.        . metoprolol tartrate (LOPRESSOR) 25 MG tablet Take 1 tablet by mouth Twice daily.      [name] Kitchen omeprazole (PRILOSEC) 40 MG capsule Take 1 capsule by mouth Daily.      [name] Kitchen trimethoprim (TRIMPEX) 100 MG tablet Take 100 mg by mouth at bedtime and may repeat dose one time if needed.          Allergies  Allergen Reactions  . Ciprofloxacin   . Codeine   . Cymbalta (Duloxetine Hcl)     nausea  . Penicillins     Patient Active Problem List  Diagnoses  . Other pulmonary embolism and infarction  . Benign hypertensive heart disease without heart failure  . Fibromyalgia  . Dyslipidemia  . Gout  . Exogenous obesity  . Right carotid artery occlusion    History  Smoking status  . Former Smoker  Smokeless tobacco  . Not on file    History  Alcohol Use: Not on file    Family History  Problem Relation Age of Onset  . Heart  disease Father   . Heart disease Mother   . Stroke Mother   . Hypertension Brother   . Hypertension Brother   . Heart attack Father 45  . Aneurysm Mother     Review of Systems: Constitutional: no fever chills diaphoresis or fatigue or change in weight.  Head and neck: no hearing loss, no epistaxis, no photophobia or visual disturbance. Respiratory: No cough, shortness of breath or wheezing. Cardiovascular: No chest pain peripheral edema, palpitations. Gastrointestinal: No abdominal distention, no abdominal pain, no change in bowel habits hematochezia or melena. Genitourinary: No dysuria, no frequency, no urgency, no nocturia. Musculoskeletal:No arthralgias, no back pain, no gait disturbance or myalgias. Neurological: No dizziness, no headaches, no numbness, no seizures, no syncope, no weakness, no tremors. Hematologic: No lymphadenopathy, no easy bruising. Psychiatric: No confusion, no hallucinations, no sleep disturbance.    Physical Exam: Filed Vitals:   01/08/11 1431    BP: 120/80  Pulse: 60   general appearance reveals a overweight woman in no acute distress.The head and neck exam reveals pupils equal and reactive.  Extraocular movements are full.  There is no scleral icterus.  The mouth and pharynx are normal.  The neck is supple.  The carotids reveal no bruits.  The jugular venous pressure is normal.  The  thyroid is not enlarged.  There is no lymphadenopathy.  The chest is clear to percussion and auscultation.  There are no rales or rhonchi.  Expansion of the chest is symmetrical.  The precordium is quiet.  The first heart sound is normal.  The second heart sound is physiologically split.  There is no murmur gallop rub or click.  There is no abnormal lift or heave.  The abdomen is soft and nontender.  The bowel sounds are normal.  The liver and spleen are not enlarged.  There are no abdominal masses.  There are no abdominal bruits.  Extremities reveal good pedal pulses.  There is no phlebitis or edema.  There is no cyanosis or clubbing.  Strength is normal and symmetrical in all extremities.  There is no lateralizing weakness.  There are no sensory deficits.  The skin is warm and dry.  There is no rash.     Assessment / Plan: Continue same medication.  Recheck in 4 months for a followup office visit and EKG.  Remain on long-term Coumadin because of past history of pulmonary embolism and it may also help her carotid artery disease and she already has one total occlusion.

## 2011-01-08 NOTE — Patient Instructions (Signed)
Your physician recommends that you continue on your current medications as directed. Please refer to the Current Medication list given to you today. Your physician recommends that you schedule a follow-up appointment in: 4 months with EKG

## 2011-01-08 NOTE — Assessment & Plan Note (Addendum)
The patient has had a lot of orthopedic surgery in the past.  It appears that she may need additional surgery next year.  She has been told that her left hip is bone on bone.  She declined a recent steroid injection into the hip because she had so many recent epidural steroids for her back pain.  She is to start some physical therapy in hopes to put off possible hip surgery until the spring

## 2011-01-08 NOTE — Assessment & Plan Note (Signed)
Her blood pressure has been more stable recently.  She has been off Diovan for the past 6 weeks and her pressure has remained normal.

## 2011-01-22 ENCOUNTER — Ambulatory Visit (INDEPENDENT_AMBULATORY_CARE_PROVIDER_SITE_OTHER): Payer: Medicare Other | Admitting: *Deleted

## 2011-01-22 DIAGNOSIS — Z7901 Long term (current) use of anticoagulants: Secondary | ICD-10-CM

## 2011-01-22 DIAGNOSIS — I2699 Other pulmonary embolism without acute cor pulmonale: Secondary | ICD-10-CM

## 2011-01-28 ENCOUNTER — Ambulatory Visit (INDEPENDENT_AMBULATORY_CARE_PROVIDER_SITE_OTHER): Payer: Medicare Other | Admitting: *Deleted

## 2011-01-28 ENCOUNTER — Other Ambulatory Visit: Payer: Self-pay | Admitting: Cardiology

## 2011-01-28 DIAGNOSIS — I6529 Occlusion and stenosis of unspecified carotid artery: Secondary | ICD-10-CM

## 2011-01-28 NOTE — Telephone Encounter (Signed)
Refilled coumadin

## 2011-02-07 ENCOUNTER — Other Ambulatory Visit: Payer: Self-pay

## 2011-02-07 DIAGNOSIS — I6529 Occlusion and stenosis of unspecified carotid artery: Secondary | ICD-10-CM

## 2011-02-07 NOTE — Procedures (Unsigned)
CAROTID DUPLEX EXAM  INDICATION:  Follow up carotid artery disease, known right ICA occlusion.  HISTORY: Diabetes:  No Cardiac:  No Hypertension:  Yes Smoking:  Previous Previous Surgery:  No CV History:  Known right ICA occlusion Amaurosis Fugax No, Paresthesias No, Hemiparesis No                                      RIGHT             LEFT Brachial systolic pressure: Brachial Doppler waveforms: Vertebral direction of flow:                          Antegrade DUPLEX VELOCITIES (cm/sec) CCA peak systolic                                     77 ECA peak systolic                                     161 ICA peak systolic                                     096 ICA end diastolic                                     61 PLAQUE MORPHOLOGY:                                    Heterogenous PLAQUE AMOUNT:                                        Moderate PLAQUE LOCATION:                                      ECA, distal CCA, ICA  IMPRESSION: 1. Doppler velocities suggest high end of 40% to 59% stenosis of the     left proximal ICA; however, velocities may be underestimated due to     acoustic shadowing. 2. This increase in the left ICA velocity is likely due to the angle     of the vessel takeoff, vessel tortuosity and compensatory flow from     a known right ICA occlusion. 3. Left ECA stenosis. 4. Velocities have increased but remain in the same category of     stenosis as compared to the previous exam.  ___________________________________________ Rosetta Posner, M.D.  EM/MEDQ  D:  01/28/2011  T:  01/28/2011  Job:  [id number]

## 2011-02-08 ENCOUNTER — Encounter: Payer: Self-pay | Admitting: Vascular Surgery

## 2011-02-18 DIAGNOSIS — R252 Cramp and spasm: Secondary | ICD-10-CM | POA: Diagnosis not present

## 2011-02-18 DIAGNOSIS — I10 Essential (primary) hypertension: Secondary | ICD-10-CM | POA: Diagnosis not present

## 2011-02-19 ENCOUNTER — Ambulatory Visit (INDEPENDENT_AMBULATORY_CARE_PROVIDER_SITE_OTHER): Payer: Medicare Other | Admitting: *Deleted

## 2011-02-19 DIAGNOSIS — M1710 Unilateral primary osteoarthritis, unspecified knee: Secondary | ICD-10-CM | POA: Diagnosis not present

## 2011-02-19 DIAGNOSIS — Z7901 Long term (current) use of anticoagulants: Secondary | ICD-10-CM

## 2011-02-19 DIAGNOSIS — I2699 Other pulmonary embolism without acute cor pulmonale: Secondary | ICD-10-CM | POA: Diagnosis not present

## 2011-02-19 LAB — POCT INR: INR: 2.2

## 2011-02-20 ENCOUNTER — Telehealth: Payer: Self-pay | Admitting: *Deleted

## 2011-02-20 NOTE — Telephone Encounter (Signed)
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 Patients last day of coumadin dose is 03/06/2011 and restart day of procedure 03/12/2011 on her regular dose. Dr Mare Ferrari is aware of procedure.

## 2011-02-21 ENCOUNTER — Other Ambulatory Visit: Payer: Self-pay | Admitting: Orthopedic Surgery

## 2011-02-26 DIAGNOSIS — IMO0001 Reserved for inherently not codable concepts without codable children: Secondary | ICD-10-CM | POA: Diagnosis not present

## 2011-02-26 DIAGNOSIS — M1990 Unspecified osteoarthritis, unspecified site: Secondary | ICD-10-CM | POA: Diagnosis not present

## 2011-03-11 ENCOUNTER — Ambulatory Visit (INDEPENDENT_AMBULATORY_CARE_PROVIDER_SITE_OTHER): Payer: Medicare Other

## 2011-03-11 DIAGNOSIS — I2699 Other pulmonary embolism without acute cor pulmonale: Secondary | ICD-10-CM | POA: Diagnosis not present

## 2011-03-11 DIAGNOSIS — Z7901 Long term (current) use of anticoagulants: Secondary | ICD-10-CM

## 2011-03-11 LAB — POCT INR: INR: 1.4

## 2011-03-12 DIAGNOSIS — M169 Osteoarthritis of hip, unspecified: Secondary | ICD-10-CM | POA: Diagnosis not present

## 2011-03-13 ENCOUNTER — Encounter: Payer: Self-pay | Admitting: *Deleted

## 2011-03-13 ENCOUNTER — Telehealth: Payer: Self-pay | Admitting: Cardiology

## 2011-03-13 NOTE — Telephone Encounter (Signed)
Will send cardiac clearance to Dr Alusio's office.  Patient will need Lovenox bridging, will forward to coumadin clinic for that. Patient is not scheduled until April but is on the cancellation list.

## 2011-03-13 NOTE — Telephone Encounter (Signed)
New problem Pt was asking about surgical clearance please call her back

## 2011-03-14 NOTE — Telephone Encounter (Signed)
Will discuss Lovenox bridging with pt closer to procedure date.

## 2011-03-19 ENCOUNTER — Ambulatory Visit (INDEPENDENT_AMBULATORY_CARE_PROVIDER_SITE_OTHER): Payer: Medicare Other | Admitting: *Deleted

## 2011-03-19 ENCOUNTER — Encounter: Payer: Medicare Other | Admitting: *Deleted

## 2011-03-19 DIAGNOSIS — I2699 Other pulmonary embolism without acute cor pulmonale: Secondary | ICD-10-CM

## 2011-03-19 DIAGNOSIS — Z7901 Long term (current) use of anticoagulants: Secondary | ICD-10-CM

## 2011-04-02 DIAGNOSIS — L84 Corns and callosities: Secondary | ICD-10-CM | POA: Diagnosis not present

## 2011-04-03 DIAGNOSIS — J159 Unspecified bacterial pneumonia: Secondary | ICD-10-CM | POA: Diagnosis not present

## 2011-04-03 DIAGNOSIS — I517 Cardiomegaly: Secondary | ICD-10-CM | POA: Diagnosis not present

## 2011-04-03 DIAGNOSIS — R05 Cough: Secondary | ICD-10-CM | POA: Diagnosis not present

## 2011-04-03 DIAGNOSIS — R0989 Other specified symptoms and signs involving the circulatory and respiratory systems: Secondary | ICD-10-CM | POA: Diagnosis not present

## 2011-04-16 ENCOUNTER — Ambulatory Visit (INDEPENDENT_AMBULATORY_CARE_PROVIDER_SITE_OTHER): Payer: Medicare Other | Admitting: *Deleted

## 2011-04-16 DIAGNOSIS — Z7901 Long term (current) use of anticoagulants: Secondary | ICD-10-CM | POA: Diagnosis not present

## 2011-04-16 DIAGNOSIS — I2699 Other pulmonary embolism without acute cor pulmonale: Secondary | ICD-10-CM | POA: Diagnosis not present

## 2011-04-16 LAB — POCT INR: INR: 2.9

## 2011-04-23 DIAGNOSIS — M899 Disorder of bone, unspecified: Secondary | ICD-10-CM | POA: Diagnosis not present

## 2011-04-23 DIAGNOSIS — M949 Disorder of cartilage, unspecified: Secondary | ICD-10-CM | POA: Diagnosis not present

## 2011-05-06 ENCOUNTER — Ambulatory Visit (HOSPITAL_COMMUNITY)
Admission: RE | Admit: 2011-05-06 | Discharge: 2011-05-06 | Disposition: A | Discharge status: Home / Self Care | Payer: Medicare Other | Source: Ambulatory Visit | Attending: Orthopedic Surgery | Admitting: Orthopedic Surgery

## 2011-05-06 ENCOUNTER — Encounter (HOSPITAL_COMMUNITY)
Admission: RE | Admit: 2011-05-06 | Discharge: 2011-05-06 | Disposition: A | Discharge status: Home / Self Care | Payer: Medicare Other | Source: Ambulatory Visit | Attending: Orthopedic Surgery | Admitting: Orthopedic Surgery

## 2011-05-06 ENCOUNTER — Encounter (HOSPITAL_COMMUNITY): Payer: Self-pay

## 2011-05-06 DIAGNOSIS — I10 Essential (primary) hypertension: Secondary | ICD-10-CM | POA: Diagnosis not present

## 2011-05-06 DIAGNOSIS — Z01818 Encounter for other preprocedural examination: Secondary | ICD-10-CM | POA: Insufficient documentation

## 2011-05-06 DIAGNOSIS — M169 Osteoarthritis of hip, unspecified: Secondary | ICD-10-CM | POA: Diagnosis not present

## 2011-05-06 DIAGNOSIS — M1610 Unilateral primary osteoarthritis, unspecified hip: Secondary | ICD-10-CM | POA: Insufficient documentation

## 2011-05-06 DIAGNOSIS — M25859 Other specified joint disorders, unspecified hip: Secondary | ICD-10-CM | POA: Diagnosis not present

## 2011-05-06 DIAGNOSIS — Z01812 Encounter for preprocedural laboratory examination: Secondary | ICD-10-CM | POA: Insufficient documentation

## 2011-05-06 DIAGNOSIS — Z01811 Encounter for preprocedural respiratory examination: Secondary | ICD-10-CM | POA: Diagnosis not present

## 2011-05-06 HISTORY — DX: Personal history of other diseases of the digestive system: Z87.19

## 2011-05-06 HISTORY — DX: Acute upper respiratory infection, unspecified: J06.9

## 2011-05-06 HISTORY — DX: Ventricular premature depolarization: I49.3

## 2011-05-06 HISTORY — DX: Peripheral vascular disease, unspecified: I73.9

## 2011-05-06 HISTORY — DX: Gastro-esophageal reflux disease without esophagitis: K21.9

## 2011-05-06 LAB — URINALYSIS, ROUTINE W REFLEX MICROSCOPIC
Bilirubin Urine: NEGATIVE
Glucose, UA: NEGATIVE mg/dL
Ketones, ur: NEGATIVE mg/dL
Protein, ur: NEGATIVE mg/dL

## 2011-05-06 LAB — CBC
HCT: 41.2 % (ref 36.0–46.0)
Hemoglobin: 13.3 g/dL (ref 12.0–15.0)
MCH: 28.1 pg (ref 26.0–34.0)
MCHC: 32.3 g/dL (ref 30.0–36.0)

## 2011-05-06 LAB — COMPREHENSIVE METABOLIC PANEL
BUN: 16 mg/dL (ref 6–23)
Calcium: 9.6 mg/dL (ref 8.4–10.5)
Creatinine, Ser: 0.79 mg/dL (ref 0.50–1.10)
GFR calc Af Amer: >90 mL/min (ref >90)
Glucose, Bld: 92 mg/dL (ref 70–99)
Total Protein: 7.4 g/dL (ref 6.0–8.3)

## 2011-05-06 LAB — URINE MICROSCOPIC-ADD ON

## 2011-05-06 LAB — PROTIME-INR
INR: 2.54 — ABNORMAL HIGH (ref 0.00–1.49)
Prothrombin Time: 27.8 seconds — ABNORMAL HIGH (ref 11.6–15.2)

## 2011-05-06 LAB — SURGICAL PCR SCREEN
MRSA, PCR: NEGATIVE
Staphylococcus aureus: NEGATIVE

## 2011-05-06 LAB — APTT: aPTT: 57 seconds — ABNORMAL HIGH (ref 24–37)

## 2011-05-06 NOTE — Pre-Procedure Instructions (Addendum)
PT STATES SHE HAS APPOINTMENT TO SEE HER CARDIOLOGIST DR. BRACKBILL TOMORROW 05/07/11 AND HE WILL DO HER EKG.  PT HAS A NOTE OF CARDIAC CLEARANCE ON HER CHART FROM DR. BRACKBILL. CBC, CMET, PT, PTT, UA , CXR AND LEFT HIP XRAY WERE DONE TODAY PREOP

## 2011-05-06 NOTE — Patient Instructions (Signed)
YOUR SURGERY IS SCHEDULED ON:  Monday  4/8  AT 2:10 PM  REPORT TO [facility] AT:  11:30 AM      PHONE # FOR SHORT STAY IS [phone]  DO NOT EAT AFTER MIDNIGHT THE NIGHT BEFORE SURGERY.  YOU MAY BRUSH YOUR TEETH,  NO FOOD, NO CHEWING GUM, NO MINTS, NO CANDIES, NO CHEWING TOBACCO.  YOU MAY HAVE CLEAR LIQUIDS TO DRINK FROM MIDNIGHT UNTIL 8:OO AM THE DAY OF YOUR SURGERY   --LIKE WATER, BLACK COFFEE OR 7 UP.    NOTHING TO DRINK AFTER 8 AM DAY OF SURGERY.  PLEASE TAKE THE FOLLOWING MEDICATIONS THE AM OF YOUR SURGERY WITH A FEW SIPS OF WATER:   METOPROLOL, AMLODIPINE, ALLOPURINOL, VICODIN, CLARITIN.   USE EYE DROPS AND BRING THE EYE DROPS.    IF YOU USE INHALERS--USE YOUR INHALERS THE AM OF YOUR SURGERY AND BRING INHALERS TO [place] TO SURGERY.    IF YOU ARE DIABETIC:  DO NOT TAKE ANY DIABETIC MEDICATIONS THE AM OF YOUR SURGERY.  IF YOU TAKE INSULIN IN THE EVENINGS--PLEASE ONLY TAKE 1/2 NORMAL EVENING DOSE THE NIGHT BEFORE YOUR SURGERY.  NO INSULIN THE AM OF YOUR SURGERY.  IF YOU HAVE SLEEP APNEA AND USE CPAP OR BIPAP--PLEASE BRING THE MASK --NOT THE MACHINE-NOT THE TUBING   -JUST THE MASK. DO NOT BRING VALUABLES, MONEY, CREDIT CARDS.  CONTACT LENS, DENTURES / PARTIALS, GLASSES SHOULD NOT BE WORN TO SURGERY AND IN MOST CASES-HEARING AIDS WILL NEED TO BE REMOVED.  BRING YOUR GLASSES CASE, ANY EQUIPMENT NEEDED FOR YOUR CONTACT LENS. FOR PATIENTS ADMITTED TO THE HOSPITAL--CHECK OUT TIME THE DAY OF DISCHARGE IS 11:00 AM.  ALL INPATIENT ROOMS ARE PRIVATE - WITH BATHROOM, TELEPHONE, TELEVISION AND WIFI INTERNET. IF YOU ARE BEING DISCHARGED THE SAME DAY OF YOUR SURGERY--YOU CAN NOT DRIVE YOURSELF HOME--AND SHOULD NOT GO HOME ALONE BY TAXI OR BUS.  NO DRIVING OR OPERATING MACHINERY FOR 24 HOURS FOLLOWING ANESTHESIA / PAIN MEDICATIONS.                            SPECIAL INSTRUCTIONS:  CHLORHEXIDINE SOAP SHOWER (other brand names are Betasept and Hibiclens ) PLEASE SHOWER WITH  CHLORHEXIDINE THE NIGHT BEFORE YOUR SURGERY AND THE AM OF YOUR SURGERY. DO NOT USE CHLORHEXIDINE ON YOUR FACE OR PRIVATE AREAS--YOU MAY USE YOUR NORMAL SOAP THOSE AREAS AND YOUR NORMAL SHAMPOO.  WOMEN SHOULD AVOID SHAVING UNDER ARMS AND SHAVING LEGS 60 HOURS BEFORE USING CHLORHEXIDINE TO AVOID SKIN IRRITATION.  DO NOT USE IF ALLERGIC TO CHLORHEXIDINE.  PLEASE READ OVER ANY  FACT SHEETS THAT YOU WERE GIVEN: MRSA INFORMATION, BLOOD TRANSFUSION INFORMATION, INCENTIVE SPIROMETER INFORMATION.

## 2011-05-07 ENCOUNTER — Ambulatory Visit (INDEPENDENT_AMBULATORY_CARE_PROVIDER_SITE_OTHER): Payer: Medicare Other | Admitting: Cardiology

## 2011-05-07 ENCOUNTER — Ambulatory Visit (INDEPENDENT_AMBULATORY_CARE_PROVIDER_SITE_OTHER): Payer: Medicare Other | Admitting: Pharmacist

## 2011-05-07 VITALS — BP 150/78 | HR 64

## 2011-05-07 DIAGNOSIS — I6521 Occlusion and stenosis of right carotid artery: Secondary | ICD-10-CM

## 2011-05-07 DIAGNOSIS — I2699 Other pulmonary embolism without acute cor pulmonale: Secondary | ICD-10-CM | POA: Diagnosis not present

## 2011-05-07 DIAGNOSIS — I119 Hypertensive heart disease without heart failure: Secondary | ICD-10-CM

## 2011-05-07 DIAGNOSIS — Z7901 Long term (current) use of anticoagulants: Secondary | ICD-10-CM | POA: Diagnosis not present

## 2011-05-07 DIAGNOSIS — I6529 Occlusion and stenosis of unspecified carotid artery: Secondary | ICD-10-CM

## 2011-05-07 LAB — POCT INR: INR: 3.3

## 2011-05-07 MED ORDER — ENOXAPARIN SODIUM 100 MG/ML ~~LOC~~ SOLN: 100.0000 mg | Freq: Two times a day (BID) | SUBCUTANEOUS | Status: DC

## 2011-05-07 NOTE — Assessment & Plan Note (Signed)
The patient has not been experiencing any TIA symptoms.

## 2011-05-07 NOTE — Pre-Procedure Instructions (Signed)
FAITH AT DR. Anne Fu OFFICE NOTIFIED TO HAVE DR. Wynelle Link OR DREW PERKINS PA REVIEW PT'S ABNORMAL UA AND PT, PTT REPORTS IN EPIC --PT STILL ON COUMADIN WHEN PT, PTT WERE DRAWN  AND PT DOES NOT HAVE PREOP ANTIBIOTIC ORDERED.

## 2011-05-07 NOTE — Patient Instructions (Signed)
Your physician recommends that you continue on your current medications as directed. Please refer to the Current Medication list given to you today. Your physician wants you to follow-up in: 4 months You will receive a reminder letter in the mail two months in advance. If you don't receive a letter, please call our office to schedule the follow-up appointment.

## 2011-05-07 NOTE — Patient Instructions (Addendum)
  Tues   4/2: Last dose of Coumadin (63m) Wed   4/3:  No Coumadin  Thurs 4/4: No Coumadin, Start Lovenox 1075min the morning and evening Fri      4/5: No Coumadin, Lovenox 10065mn the morning and evening Sat     4/6: No Coumadin, Lovenox 100m59m the morning and evening Sun    4/7: No Coumadin, Lovenox 100mg76mthe morning (but NOT in the evening) Mon    4/8: Day of Surgery; No Coumadin, No Lovenox (Restart coumadin as instructed in hospital)  Follow-up after discharge to schedule an appointment to check your INR

## 2011-05-07 NOTE — Assessment & Plan Note (Signed)
Patient remains on long-term Coumadin anticoagulation managed through the [facility] our Cou[facility]in clinic.  She will be bridged with Lovenox prior to her hip surgery which is scheduled for April 8.  Following her hip surgery she will go back to [facility] for rehabilitation before returning home.  The patient has not been experiencing any pleuritic chest pain and no angina pectoris pain

## 2011-05-07 NOTE — Assessment & Plan Note (Signed)
The patient has a history of labile hypertension.  When she checks her blood pressure at home it is in the 120/70 range.  Today initially her systolic was quite high and on repeat her blood pressure was 150/80.  She has not been expressing any headaches or dizzy spells.  She has not been expressing any symptoms of congestive heart failure although she is quite sedentary because of her orthopedic problems.

## 2011-05-07 NOTE — Progress Notes (Signed)
Genia Del Date of Birth:  [birth year] [facility] [address] [place], [place]  [address] [phone]  Fax   [phone]  HPI: This pleasant 77 year old woman is seen for a four-month followup office visit.  She has a history of high blood pressure and a past history of coronary embolism.  She also has a history of severe osteoarthritis.  Her pulmonary emboli occurred in the setting of postoperative hip operation in January 2011.  That was the right hip.  Venous Dopplers of her legs at the time of the pulmonary embolus were negative.  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  She is on long-term Coumadin because of her vascular disease and her prior history of pulmonary emboli.  She has not had any TIA symptoms.  She's not having any chest pain or angina.  She had cardiac catheterization in 1994 which showed only mild plaque.  Her last nuclear stress test done preoperatively before total knee replacement last year was in December 2011 showed no ischemia and her ejection fraction was 78 percent.  Current Outpatient Prescriptions  Medication Sig Dispense Refill  . acetaminophen (TYLENOL) 500 MG tablet Take 1,000 mg by mouth every 6 (six) hours as needed.      [name] Kitchen allopurinol (ZYLOPRIM) 300 MG tablet Take 1 tablet by mouth Daily.      [name] Kitchen amLODipine (NORVASC) 5 MG tablet Take 1 tablet by mouth Daily.      . Brimonidine Tartrate-Timolol (COMBIGAN OP) Apply 1 drop to eye 2 (two) times daily. RIGHT EYE      . Calcium Carbonate-Vitamin D (CALTRATE 600+D PO) Take 1 tablet by mouth daily.       . CELEBREX 200 MG capsule Take 1 capsule by mouth Daily.      [name] Kitchen COUMADIN 2 MG tablet TAKE 2 TABLETS ON TUES, THURS AND SAT AND THEN 1 TABLET ON OTHER DAYSOR AS DIRECTED.  45 each  11  . diphenhydrAMINE (BENADRYL) 25 mg capsule Take 25 mg by mouth at bedtime. For sleep      . fenofibrate  160 MG tablet 1 tablet. EVENING      . furosemide (LASIX) 40 MG tablet 1/2 TAB Every other day prn for fluid      . HYDROcodone-acetaminophen (VICODIN) 5-500 MG per tablet Take 1 tablet by mouth Daily.      . indapamide (LOZOL) 2.5 MG tablet Take 2.5 mg by mouth Daily.      . Loratadine (CLARITIN PO) Take 1 tablet by mouth as needed.       . Magnesium 400 MG CAPS Take 1 capsule by mouth daily.       . metoprolol tartrate (LOPRESSOR) 25 MG tablet Take 1 tablet by mouth Twice daily.      [name] Kitchen omeprazole (PRILOSEC) 40 MG capsule Take 1 capsule by mouth Daily.      [name] Kitchen enoxaparin (LOVENOX) 100 MG/ML injection Inject 1 mL (100 mg total) into the skin every 12 (twelve) hours.  10 Syringe  0    Allergies  Allergen Reactions  . Ciprofloxacin     RASH  . Codeine     NAUSEA  . Cymbalta (Duloxetine Hcl)     Nausea VOMITING AND ABDOMINAL PAIN, HEADACHE, JUST ABOUT EVERY SIDE EFFECT THE DRUG HAS  . Penicillins     RASH  & ITCHING   --PT STATES SHE CAN TAKE KEFLEX  PO AND IV CEPHALOSPORINS  . Tetanus Toxoids     SWELLING, REDNESS  WHOLE ARM    Patient Active Problem List  Diagnoses  . Other pulmonary embolism and infarction  . Benign hypertensive heart disease without heart failure  . Fibromyalgia  . Dyslipidemia  . Gout  . Exogenous obesity  . Right carotid artery occlusion  . Osteoarthritis    History  Smoking status  . Former Smoker  Smokeless tobacco  . Never Used  Comment: QUIT SMOKING 22 YRS AGO    History  Alcohol Use  . Yes    RARELY    Family History  Problem Relation Age of Onset  . Heart disease Father   . Heart disease Mother   . Stroke Mother   . Hypertension Brother   . Hypertension Brother   . Heart attack Father 25  . Aneurysm Mother     Review of Systems: The patient denies any heat or cold intolerance.  No weight gain or weight loss.  The patient denies headaches or blurry vision.  There is no cough or sputum production.  The patient denies dizziness.   There is no hematuria or hematochezia.  The patient denies any muscle aches or arthritis.  The patient denies any rash.  The patient denies frequent falling or instability.  There is no history of depression or anxiety.  All other systems were reviewed and are negative.   Physical Exam: Filed Vitals:   05/07/11 1140  BP: 150/78  Pulse:    the general appearance reveals a well-developed large woman in no distress.Pupils equal and reactive.   Extraocular Movements are full.  There is no scleral icterus.  The mouth and pharynx are normal.  The neck is supple.  The carotids reveal bilateral soft bruits.  The jugular venous pressure is normal.  The thyroid is not enlarged.  There is no lymphadenopathy.The chest is clear to percussion and auscultation. There are no rales or rhonchi. Expansion of the chest is symmetrical.  The precordium is quiet.  The first heart sound is normal.  The second heart sound is physiologically split.  There is no murmur gallop rub or click.  There is no abnormal lift or heave.  The abdomen is soft and nontender. Bowel sounds are normal. The liver and spleen are not enlarged. There Are no abdominal masses. There are no bruits.  Extremities show no phlebitis or edema.  Pedal pulses are present.Strength is normal and symmetrical in all extremities.  There is no lateralizing weakness.  There are no sensory deficits.  The skin is warm and dry.  There is no rash.  EKG today shows normal sinus rhythm with left ventricular hypertrophy with strain unchanged from prior tracings.      Assessment / Plan: The patient is cleared from a cardiology standpoint for her planned left hip replacement on April 8.  She will continue same medication.  She is working with the Coumadin clinic on Lovenox bridging leading up to surgery.  She'll be rechecked here in 4 months for followup office visit.

## 2011-05-08 ENCOUNTER — Other Ambulatory Visit: Payer: Self-pay | Admitting: Internal Medicine

## 2011-05-08 DIAGNOSIS — Z1231 Encounter for screening mammogram for malignant neoplasm of breast: Secondary | ICD-10-CM

## 2011-05-09 NOTE — Pre-Procedure Instructions (Signed)
PT'S EKG REPORT AND CARDIOLOGY OFFICE NOTE FROM DR. BRACKBILL  05/07/2011 IN EPIC AND COPY ON PT'S CHART.

## 2011-05-13 DIAGNOSIS — A088 Other specified intestinal infections: Secondary | ICD-10-CM | POA: Diagnosis not present

## 2011-05-13 DIAGNOSIS — K5090 Crohn's disease, unspecified, without complications: Secondary | ICD-10-CM | POA: Diagnosis not present

## 2011-05-13 DIAGNOSIS — R197 Diarrhea, unspecified: Secondary | ICD-10-CM | POA: Diagnosis not present

## 2011-05-13 DIAGNOSIS — R112 Nausea with vomiting, unspecified: Secondary | ICD-10-CM | POA: Diagnosis not present

## 2011-05-17 NOTE — Pre-Procedure Instructions (Signed)
WRITTEN NOTE FAXED TO DR. Anne Fu OFFICE 05/16/11 TO REMIND HIM THAT PT DOES NOT HAVE PREOP ANTIBIOTIC ORDER IN EPIC.

## 2011-05-28 ENCOUNTER — Ambulatory Visit (INDEPENDENT_AMBULATORY_CARE_PROVIDER_SITE_OTHER): Payer: Medicare Other | Admitting: Pharmacist

## 2011-05-28 DIAGNOSIS — Z7901 Long term (current) use of anticoagulants: Secondary | ICD-10-CM | POA: Diagnosis not present

## 2011-05-28 DIAGNOSIS — I2699 Other pulmonary embolism without acute cor pulmonale: Secondary | ICD-10-CM | POA: Diagnosis not present

## 2011-05-28 LAB — POCT INR: INR: 3.8

## 2011-05-28 NOTE — Patient Instructions (Signed)
Tues 4/30: Last dose of Coumadin (61m)  Wed 5/1: No Coumadin  Thurs 5/2: No Coumadin, Start Lovenox 1085min the morning and evening  Fri 5/3: No Coumadin, Lovenox 10033mn the morning and evening  Sat 5/4: No Coumadin, Lovenox 100m68m the morning and evening  Sun 5/5: No Coumadin, Lovenox 100mg86mthe morning (but NOT in the evening)  Mon 5/6: Day of Surgery; No Coumadin, No Lovenox (Restart coumadin as instructed in hospital).  Please call the clinic when you are discharged from rehab so we can reschedule your next appt.

## 2011-06-16 ENCOUNTER — Other Ambulatory Visit: Payer: Self-pay | Admitting: Orthopedic Surgery

## 2011-06-16 MED ORDER — VANCOMYCIN HCL 1000 MG IV SOLR
1500.0000 mg | INTRAVENOUS | Status: AC
  Administered 2011-06-17: 1000 mg via INTRAVENOUS
  Filled 2011-06-16: qty 1500

## 2011-06-16 NOTE — H&P (Signed)
Genia Del  DOB: 1935/02/07 Married / Language: English / Race: White Female  Date of Admission:  06/17/2011  Chief Complaint:  Left Hip Pain  History of Present Illness The patient is a 77 year old female who comes in for a preoperative History and Physical. The patient is scheduled for a left total hip arthroplasty to be performed by Dr. Dione Plover. Aluisio, MD at [facility] on 06/17/2011. The patient is a 77 year old female who is being followed for their left hip osteoarthritis. Symptoms reported include: pain. The patient feels that they are doing poorlydue to her hips hurting. The left has not had an injection but she did therapy and that did not help at all. She reports her pain level to be moderate to severe. The following medication has been used for pain control: antiinflammatory medication in the form of celebrex and Hydrocodone which Dr Ouida Sills gives her for the shoulders and fibromyalgia. The patient has not gotten any relief of their symptoms with physical therapy. The left hip is getting progressively worse. It is limiting what she can and cannot do. With regards to the right knee, she feels great. She is real pleased with how that is doing. She has not had any intervention for the left hip. She is ready to proceed with surgical fixation of the hip at this time. They have been treated conservatively in the past for the above stated problem and despite conservative measures, they continue to have progressive pain and severe functional limitations and dysfunction. They have failed non-operative management. It is felt that they would benefit from undergoing total joint replacement. Risks and benefits of the procedure have been discussed with the patient and they elect to proceed with surgery. There are no active contraindications to surgery such as ongoing infection or rapidly progressive neurological disease.  Allergies Cymbalta *ANTIDEPRESSANTS*. Abdominal pain,  Dizziness, Nausea. Blurred vision Penicillins. Rash. Codeine/Codeine Derivatives. Nausea. ABLE TO TAKE HYDROCODONE Tetanus Toxoid Adsorbed *TOXOIDS* CIPRO. 01/05/2008 Rash.  Medication History Allopurinol (300MG Tablet, 1 Oral daily) Active. Calcium Carbonate-Vitamin D (600-200MG-UNIT Tablet, 1 Oral daily) Active. Fenofibrate (160MG Tablet, 1 Oral daily) Active. Norvasc (5MG Tablet, 1 Oral daily) Active. Indapamide (2.5MG Tablet, Oral) Active. Magnesium (200MG Tablet, 2 Oral daily) Active. Omeprazole (40MG Capsule DR, 1 Oral daily) Active. Combigan (0.2-0.5% Solution, Ophthalmic) Active. Coumadin (2MG Tablet, Oral) Active. Lasix (40MG Tablet, 0.5 Oral daily) Active. Vicodin (5-500MG Tablet, 1 Oral every six hours) Active. Loratadine (10MG Tablet, 1 Oral as needed) Active. Tylenol (500MG Capsule, Oral) Active.  Past Surgical History Gallbladder Surgery. [Insert Date into Details]. open, 1990's Hysterectomy. Date: 48. complete (non-cancerous) Cataract Surgery. Date: 2011. right Total Hip Replacement. Date: 2004. right Total Knee Replacement. Date: 2012. right ORIF Right Femur. Date: 2011. Cable Fixation Lumbar Decompression. Date: 2008. L3-S1 Dilation and Curettage of Uterus - Multiple Colon Polyp Removal - Colonoscopy Appendectomy  Problem List/Past Medical Osteoarthritis, Hip (715.35) Bursitis, Hip (726.5) Post-laminectomy Syndrome, Lumbar (722.83) Hammer toe, other, acquired (735.4). 10/28/2008 Tinnitus Cataract Hemorrhoids Hiatal Hernia Hypertension Urinary Tract Infection. Occassional History Urinary Incontinence Sleep Apnea Peripheral Neuropathy Osteoarthritis Fibromyalgia Crohn's Disease High blood pressure Gout Gastroesophageal Reflux Disease Spondylosis, lumbosacral (721.3) Scoliosis, idiopathic (737.30)  Family History Heart Disease. father and brother Diabetes Mellitus. grandfather fathers side Chronic Obstructive Lung Disease.  father Heart disease in female family member before age 75 Hypertension. father and brother  Social History Tobacco use. Former smoker. stopped 25 years ago, former smoker; smoke(d) less than 1/2 pack(s) per day Living  situation. live with spouse Illicit drug use. no Drug/Alcohol Rehab (Previously). no Marital status. married Tobacco / smoke exposure. no Pain Contract. no Number of flights of stairs before winded. less than 1 Drug/Alcohol Rehab (Currently). no Advance Directives. Livng Will Post-Surgical Plans. [place] Current occupation. RN, retired Alcohol use. Occasional alcohol use. current drinker; only occasionally per week Children. 1 Current work status. retired  Review of Systems General:Present- Fatigue and Weight Gain. Not Present- Chills, Fever, Night Sweats, Weight Loss and Memory Loss. Skin:Not Present- Hives, Itching, Rash, Eczema and Lesions. HEENT:Not Present- Tinnitus, Headache, Double Vision, Visual Loss, Hearing Loss and Dentures. Respiratory:Present- Shortness of breath with exertion, Shortness of breath at rest, Chronic Cough and Wheezing. Not Present- Allergies and Coughing up blood. Cardiovascular:Present- Leg Cramps. Not Present- Chest Pain, Racing/skipping heartbeats, Difficulty Breathing Lying Down, Murmur, Swelling and Palpitations. Gastrointestinal:Present- Heartburn. Not Present- Bloody Stool, Abdominal Pain, Vomiting, Nausea, Constipation, Diarrhea, Difficulty Swallowing, Jaundice and Loss of appetitie. Female Genitourinary:Not Present- Blood in Urine, Urinary frequency, Weak urinary stream, Discharge, Flank Pain, Incontinence, Painful Urination, Urgency, Urinary Retention and Urinating at Night. Musculoskeletal:Not Present- Muscle Weakness, Muscle Pain, Joint Swelling, Joint Pain, Back Pain, Morning Stiffness and Spasms. Neurological:Present- Tingling. Not Present- Tremor, Dizziness, Blackout spells, Paralysis, Difficulty  with balance and Weakness. Psychiatric:Not Present- Insomnia.   Vitals Weight: 232 lb Height: 62 in Body Surface Area: 2.15 m Body Mass Index: 42.43 kg/m Pulse: 68 (Regular) Resp.: 18 (Unlabored) BP: 146/78 (Sitting, Right Arm, Standard)  Physical Exam The physical exam findings are as follows: Patient is a very pleasant 77 year old female with continued hip pain.   General Mental Status - Alert, cooperative and good historian. General Appearance- pleasant. Not in acute distress. Orientation- Oriented X3. Build & Nutrition- Well nourished and Well developed.   Head and Neck Head- normocephalic, atraumatic . Neck Global Assessment- bruit auscultated on the right, bruit auscultated on the left and supple. Carotid Arteries- Bilateral- bruit (right is louder than left (Right Carotid 100% and Left Carotid 40-50% as per patient) - followed by Dr. Donnetta Hutching on regular basis.).   Eye Pupil- Bilateral- Regular and Round. Motion- Bilateral- EOMI.   Chest and Lung Exam Auscultation: Breath sounds:- clear at anterior chest wall and - clear at posterior chest wall. Adventitious sounds:- No Adventitious sounds.   Cardiovascular Auscultation:Rhythm- Regular rate and rhythm. Heart Sounds- S1 WNL and S2 WNL. Murmurs & Other Heart Sounds:Auscultation of the heart reveals - No Murmurs.   Abdomen Inspection:Contour- Generalized moderate distention. Palpation/Percussion:Tenderness- Abdomen is non-tender to palpation. Rigidity (guarding)- Abdomen is soft. Auscultation:Auscultation of the abdomen reveals - Bowel sounds normal.   Female Genitourinary Not done, not pertinent to present illness  Musculoskeletal Her right knee looks great. Range is 0-120 degrees. There is no swelling, tenderness or instability about the right knee. Right hip can be flexed to about 100, rotated in 20, out 30 and abducted 30 without discomfort. The left hip can  flex to about 100. No internal rotation, about 10 external rotation and 20 abduction.  RADIOGRAPHS: Radiographs reviewed from last visit. AP pelvis and AP and lateral of the left hip. She has advanced end stage arthritis of the left hip. Her prosthesis on the right looks good. Her knee films taken today, AP and lateral, show the prosthesis in excellent position with no periprosthetic abnormalities.  Assessment & Plan Osteoarthritis: Left Hip  Patient is for a Left Total Hip Replacement by Dr. Wynelle Link.  Plan is to go to [facility] after the hospital stay.  PCP - Dr. Deland Pretty Cards - Dr. Warren Danes - Patient has been seen by Dr. Mare Ferrari and felt to be at moderate risk from a cardiac standpoint in regards to her hip surgery. It is felt that it is OK to proceed without further cardiac testing. She is on chronic coumadin which will be stopped five days prior to surgery at which time she will be started on a Lovenox bridge preoperatively which has been directed by Dr. Mare Ferrari. She will be placed back on Coumadin along with Lovenox postoperatively.  Arlee Muslim, PA-C

## 2011-06-17 ENCOUNTER — Encounter (HOSPITAL_COMMUNITY)
Admission: RE | Disposition: A | Discharge status: Skilled Nursing Facility | Payer: Self-pay | Source: Ambulatory Visit | Attending: Orthopedic Surgery

## 2011-06-17 ENCOUNTER — Ambulatory Visit (HOSPITAL_COMMUNITY): Payer: Medicare Other | Admitting: Anesthesiology

## 2011-06-17 ENCOUNTER — Inpatient Hospital Stay (HOSPITAL_COMMUNITY)
Admission: RE | Admit: 2011-06-17 | Discharge: 2011-06-20 | DRG: 470 | Disposition: A | Discharge status: Skilled Nursing Facility | Payer: Medicare Other | Source: Ambulatory Visit | Attending: Orthopedic Surgery | Admitting: Orthopedic Surgery

## 2011-06-17 ENCOUNTER — Encounter (HOSPITAL_COMMUNITY): Payer: Self-pay | Admitting: Anesthesiology

## 2011-06-17 ENCOUNTER — Encounter (HOSPITAL_COMMUNITY): Payer: Self-pay | Admitting: *Deleted

## 2011-06-17 ENCOUNTER — Inpatient Hospital Stay (HOSPITAL_COMMUNITY): Payer: Medicare Other

## 2011-06-17 ENCOUNTER — Other Ambulatory Visit: Payer: Self-pay | Admitting: Orthopedic Surgery

## 2011-06-17 DIAGNOSIS — IMO0001 Reserved for inherently not codable concepts without codable children: Secondary | ICD-10-CM | POA: Diagnosis not present

## 2011-06-17 DIAGNOSIS — E785 Hyperlipidemia, unspecified: Secondary | ICD-10-CM | POA: Diagnosis not present

## 2011-06-17 DIAGNOSIS — K219 Gastro-esophageal reflux disease without esophagitis: Secondary | ICD-10-CM | POA: Diagnosis present

## 2011-06-17 DIAGNOSIS — Z7901 Long term (current) use of anticoagulants: Secondary | ICD-10-CM | POA: Diagnosis not present

## 2011-06-17 DIAGNOSIS — M1990 Unspecified osteoarthritis, unspecified site: Secondary | ICD-10-CM | POA: Diagnosis not present

## 2011-06-17 DIAGNOSIS — I10 Essential (primary) hypertension: Secondary | ICD-10-CM | POA: Diagnosis present

## 2011-06-17 DIAGNOSIS — M1610 Unilateral primary osteoarthritis, unspecified hip: Secondary | ICD-10-CM | POA: Diagnosis not present

## 2011-06-17 DIAGNOSIS — M169 Osteoarthritis of hip, unspecified: Secondary | ICD-10-CM | POA: Diagnosis present

## 2011-06-17 DIAGNOSIS — I499 Cardiac arrhythmia, unspecified: Secondary | ICD-10-CM | POA: Diagnosis present

## 2011-06-17 DIAGNOSIS — Z6841 Body Mass Index (BMI) 40.0 and over, adult: Secondary | ICD-10-CM | POA: Diagnosis not present

## 2011-06-17 DIAGNOSIS — M25559 Pain in unspecified hip: Secondary | ICD-10-CM | POA: Diagnosis not present

## 2011-06-17 DIAGNOSIS — Z96649 Presence of unspecified artificial hip joint: Secondary | ICD-10-CM | POA: Diagnosis not present

## 2011-06-17 DIAGNOSIS — I739 Peripheral vascular disease, unspecified: Secondary | ICD-10-CM | POA: Diagnosis not present

## 2011-06-17 DIAGNOSIS — E669 Obesity, unspecified: Secondary | ICD-10-CM | POA: Diagnosis present

## 2011-06-17 DIAGNOSIS — S79919A Unspecified injury of unspecified hip, initial encounter: Secondary | ICD-10-CM | POA: Diagnosis not present

## 2011-06-17 DIAGNOSIS — G4733 Obstructive sleep apnea (adult) (pediatric): Secondary | ICD-10-CM | POA: Diagnosis present

## 2011-06-17 DIAGNOSIS — K449 Diaphragmatic hernia without obstruction or gangrene: Secondary | ICD-10-CM | POA: Diagnosis present

## 2011-06-17 DIAGNOSIS — Z5189 Encounter for other specified aftercare: Secondary | ICD-10-CM | POA: Diagnosis not present

## 2011-06-17 DIAGNOSIS — D62 Acute posthemorrhagic anemia: Secondary | ICD-10-CM | POA: Diagnosis not present

## 2011-06-17 DIAGNOSIS — I2510 Atherosclerotic heart disease of native coronary artery without angina pectoris: Secondary | ICD-10-CM | POA: Diagnosis not present

## 2011-06-17 DIAGNOSIS — Z471 Aftercare following joint replacement surgery: Secondary | ICD-10-CM | POA: Diagnosis not present

## 2011-06-17 HISTORY — PX: JOINT REPLACEMENT: SHX530

## 2011-06-17 HISTORY — PX: TOTAL HIP ARTHROPLASTY: SHX124

## 2011-06-17 LAB — COMPREHENSIVE METABOLIC PANEL WITH GFR
ALT: 25 U/L (ref 0–35)
AST: 35 U/L (ref 0–37)
Albumin: 3.5 g/dL (ref 3.5–5.2)
Alkaline Phosphatase: 80 U/L (ref 39–117)
BUN: 18 mg/dL (ref 6–23)
CO2: 25 meq/L (ref 19–32)
Calcium: 9.2 mg/dL (ref 8.4–10.5)
Chloride: 102 meq/L (ref 96–112)
Creatinine, Ser: 0.78 mg/dL (ref 0.50–1.10)
GFR calc Af Amer: >90 mL/min (ref >90)
GFR calc non Af Amer: 80 mL/min — ABNORMAL LOW (ref >90)
Glucose, Bld: 108 mg/dL — ABNORMAL HIGH (ref 70–99)
Potassium: 3.6 meq/L (ref 3.5–5.1)
Sodium: 138 meq/L (ref 135–145)
Total Bilirubin: 0.5 mg/dL (ref 0.3–1.2)
Total Protein: 6.7 g/dL (ref 6.0–8.3)

## 2011-06-17 LAB — URINE MICROSCOPIC-ADD ON

## 2011-06-17 LAB — TYPE AND SCREEN
ABO/RH(D): A POS
Antibody Screen: NEGATIVE

## 2011-06-17 LAB — CBC
HCT: 36.5 % (ref 36.0–46.0)
Hemoglobin: 11.9 g/dL — ABNORMAL LOW (ref 12.0–15.0)
MCH: 28.3 pg (ref 26.0–34.0)
MCHC: 32.6 g/dL (ref 30.0–36.0)
MCV: 86.7 fL (ref 78.0–100.0)
Platelets: 123 K/uL — ABNORMAL LOW (ref 150–400)
RBC: 4.21 MIL/uL (ref 3.87–5.11)
RDW: 15.3 % (ref 11.5–15.5)
WBC: 6 K/uL (ref 4.0–10.5)

## 2011-06-17 LAB — PROTIME-INR
INR: 1.14 (ref 0.00–1.49)
Prothrombin Time: 14.8 s (ref 11.6–15.2)

## 2011-06-17 LAB — URINALYSIS, ROUTINE W REFLEX MICROSCOPIC
Bilirubin Urine: NEGATIVE
Glucose, UA: NEGATIVE mg/dL
Hgb urine dipstick: NEGATIVE
Ketones, ur: NEGATIVE mg/dL
Nitrite: NEGATIVE
Protein, ur: NEGATIVE mg/dL
Specific Gravity, Urine: 1.006 (ref 1.005–1.030)
Urobilinogen, UA: 0.2 mg/dL (ref 0.0–1.0)
pH: 6.5 (ref 5.0–8.0)

## 2011-06-17 LAB — APTT: aPTT: 36 s (ref 24–37)

## 2011-06-17 SURGERY — ARTHROPLASTY, HIP, TOTAL,POSTERIOR APPROACH
Anesthesia: General | Site: Hip | Laterality: Left | Wound class: Clean

## 2011-06-17 MED ORDER — METOCLOPRAMIDE HCL 5 MG/ML IJ SOLN: 5.0000 mg | Freq: Three times a day (TID) | INTRAMUSCULAR | Status: DC | PRN

## 2011-06-17 MED ORDER — DIPHENHYDRAMINE HCL 12.5 MG/5ML PO ELIX
12.5000 mg | ORAL_SOLUTION | ORAL | Status: DC | PRN
  Administered 2011-06-18: 25 mg via ORAL
  Filled 2011-06-17: qty 10

## 2011-06-17 MED ORDER — SODIUM CHLORIDE 0.9 % IV SOLN: INTRAVENOUS | Status: DC

## 2011-06-17 MED ORDER — FENTANYL CITRATE 0.05 MG/ML IJ SOLN
INTRAMUSCULAR | Status: DC | PRN
  Administered 2011-06-17: 100 ug via INTRAVENOUS
  Administered 2011-06-17 (×3): 50 ug via INTRAVENOUS

## 2011-06-17 MED ORDER — ACETAMINOPHEN 10 MG/ML IV SOLN
1000.0000 mg | Freq: Four times a day (QID) | INTRAVENOUS | Status: AC
  Administered 2011-06-17 – 2011-06-18 (×4): 1000 mg via INTRAVENOUS
  Filled 2011-06-17 (×7): qty 100

## 2011-06-17 MED ORDER — PANTOPRAZOLE SODIUM 40 MG PO TBEC
80.0000 mg | DELAYED_RELEASE_TABLET | Freq: Every day | ORAL | Status: DC
  Administered 2011-06-17: 80 mg via ORAL
  Filled 2011-06-17 (×2): qty 2

## 2011-06-17 MED ORDER — DEXAMETHASONE SODIUM PHOSPHATE 10 MG/ML IJ SOLN: 10.0000 mg | Freq: Once | INTRAMUSCULAR | Status: DC

## 2011-06-17 MED ORDER — HYDROMORPHONE HCL PF 1 MG/ML IJ SOLN
INTRAMUSCULAR | Status: DC | PRN
  Administered 2011-06-17: 0.5 mg via INTRAVENOUS
  Administered 2011-06-17: 1 mg via INTRAVENOUS
  Administered 2011-06-17: 0.5 mg via INTRAVENOUS

## 2011-06-17 MED ORDER — ACETAMINOPHEN 325 MG PO TABS: 650.0000 mg | ORAL_TABLET | Freq: Four times a day (QID) | ORAL | Status: DC | PRN

## 2011-06-17 MED ORDER — FENOFIBRATE 160 MG PO TABS
160.0000 mg | ORAL_TABLET | Freq: Every day | ORAL | Status: DC
  Administered 2011-06-18 – 2011-06-19 (×2): 160 mg via ORAL
  Filled 2011-06-17 (×4): qty 1

## 2011-06-17 MED ORDER — BRIMONIDINE TARTRATE-TIMOLOL 0.2-0.5 % OP SOLN
1.0000 [drp] | Freq: Two times a day (BID) | OPHTHALMIC | Status: DC
  Administered 2011-06-17: 1 [drp] via OPHTHALMIC

## 2011-06-17 MED ORDER — ACETAMINOPHEN 10 MG/ML IV SOLN
1000.0000 mg | Freq: Once | INTRAVENOUS | Status: DC
  Filled 2011-06-17: qty 100

## 2011-06-17 MED ORDER — ROCURONIUM BROMIDE 100 MG/10ML IV SOLN
INTRAVENOUS | Status: DC | PRN
  Administered 2011-06-17: 40 mg via INTRAVENOUS

## 2011-06-17 MED ORDER — ONDANSETRON HCL 4 MG/2ML IJ SOLN: 4.0000 mg | Freq: Four times a day (QID) | INTRAMUSCULAR | Status: DC | PRN

## 2011-06-17 MED ORDER — MENTHOL 3 MG MT LOZG
1.0000 | LOZENGE | OROMUCOSAL | Status: DC | PRN
  Filled 2011-06-17: qty 9

## 2011-06-17 MED ORDER — LABETALOL HCL 5 MG/ML IV SOLN
INTRAVENOUS | Status: DC | PRN
  Administered 2011-06-17 (×2): 5 mg via INTRAVENOUS

## 2011-06-17 MED ORDER — TIMOLOL MALEATE 0.5 % OP SOLN
1.0000 [drp] | Freq: Two times a day (BID) | OPHTHALMIC | Status: DC
  Administered 2011-06-17 – 2011-06-20 (×6): 1 [drp] via OPHTHALMIC
  Filled 2011-06-17: qty 5

## 2011-06-17 MED ORDER — METHOCARBAMOL 500 MG PO TABS
500.0000 mg | ORAL_TABLET | Freq: Four times a day (QID) | ORAL | Status: DC | PRN
  Administered 2011-06-18 – 2011-06-20 (×4): 500 mg via ORAL
  Filled 2011-06-17 (×4): qty 1

## 2011-06-17 MED ORDER — FUROSEMIDE 20 MG PO TABS: 20.0000 mg | ORAL_TABLET | Freq: Every day | ORAL | Status: DC | PRN

## 2011-06-17 MED ORDER — POLYETHYLENE GLYCOL 3350 17 G PO PACK: 17.0000 g | PACK | Freq: Every day | ORAL | Status: DC | PRN

## 2011-06-17 MED ORDER — METHOCARBAMOL 100 MG/ML IJ SOLN
500.0000 mg | Freq: Four times a day (QID) | INTRAVENOUS | Status: DC | PRN
  Filled 2011-06-17: qty 5

## 2011-06-17 MED ORDER — GLYCOPYRROLATE 0.2 MG/ML IJ SOLN
INTRAMUSCULAR | Status: DC | PRN
  Administered 2011-06-17: .7 mg via INTRAVENOUS

## 2011-06-17 MED ORDER — ACETAMINOPHEN 10 MG/ML IV SOLN
INTRAVENOUS | Status: AC
  Filled 2011-06-17: qty 100

## 2011-06-17 MED ORDER — METOPROLOL TARTRATE 25 MG PO TABS
25.0000 mg | ORAL_TABLET | Freq: Two times a day (BID) | ORAL | Status: DC
  Administered 2011-06-17 – 2011-06-20 (×6): 25 mg via ORAL
  Filled 2011-06-17 (×7): qty 1

## 2011-06-17 MED ORDER — PHENOL 1.4 % MT LIQD
1.0000 | OROMUCOSAL | Status: DC | PRN
  Filled 2011-06-17: qty 177

## 2011-06-17 MED ORDER — MIDAZOLAM HCL 5 MG/5ML IJ SOLN
INTRAMUSCULAR | Status: DC | PRN
  Administered 2011-06-17: 1 mg via INTRAVENOUS

## 2011-06-17 MED ORDER — CEFAZOLIN SODIUM-DEXTROSE 2-3 GM-% IV SOLR: 2.0000 g | INTRAVENOUS | Status: DC

## 2011-06-17 MED ORDER — LACTATED RINGERS IV SOLN
INTRAVENOUS | Status: DC
  Administered 2011-06-17: 15:00:00 via INTRAVENOUS

## 2011-06-17 MED ORDER — CHLORHEXIDINE GLUCONATE 4 % EX LIQD
60.0000 mL | Freq: Once | CUTANEOUS | Status: DC
  Filled 2011-06-17: qty 60

## 2011-06-17 MED ORDER — ACETAMINOPHEN 650 MG RE SUPP: 650.0000 mg | Freq: Four times a day (QID) | RECTAL | Status: DC | PRN

## 2011-06-17 MED ORDER — FLEET ENEMA 7-19 GM/118ML RE ENEM: 1.0000 | ENEMA | Freq: Once | RECTAL | Status: AC | PRN

## 2011-06-17 MED ORDER — LORATADINE 10 MG PO TABS
10.0000 mg | ORAL_TABLET | Freq: Every day | ORAL | Status: DC
  Administered 2011-06-18 – 2011-06-20 (×3): 10 mg via ORAL
  Filled 2011-06-17 (×4): qty 1

## 2011-06-17 MED ORDER — BRIMONIDINE TARTRATE 0.2 % OP SOLN
1.0000 [drp] | Freq: Two times a day (BID) | OPHTHALMIC | Status: DC
  Administered 2011-06-17 – 2011-06-20 (×6): 1 [drp] via OPHTHALMIC
  Filled 2011-06-17: qty 5

## 2011-06-17 MED ORDER — BISACODYL 10 MG RE SUPP: 10.0000 mg | Freq: Every day | RECTAL | Status: DC | PRN

## 2011-06-17 MED ORDER — WARFARIN - PHARMACIST DOSING INPATIENT: Freq: Every day | Status: DC

## 2011-06-17 MED ORDER — VANCOMYCIN HCL IN DEXTROSE 1-5 GM/200ML-% IV SOLN
INTRAVENOUS | Status: AC
  Filled 2011-06-17: qty 200

## 2011-06-17 MED ORDER — HYDRALAZINE HCL 20 MG/ML IJ SOLN
INTRAMUSCULAR | Status: DC | PRN
  Administered 2011-06-17: 4 mg via INTRAVENOUS

## 2011-06-17 MED ORDER — NEOSTIGMINE METHYLSULFATE 1 MG/ML IJ SOLN
INTRAMUSCULAR | Status: DC | PRN
  Administered 2011-06-17: 5 mg via INTRAVENOUS

## 2011-06-17 MED ORDER — BUPIVACAINE LIPOSOME 1.3 % IJ SUSP
20.0000 mL | Freq: Once | INTRAMUSCULAR | Status: AC
  Administered 2011-06-17: 20 mL
  Filled 2011-06-17: qty 20

## 2011-06-17 MED ORDER — TRAMADOL HCL 50 MG PO TABS
50.0000 mg | ORAL_TABLET | Freq: Four times a day (QID) | ORAL | Status: DC | PRN
  Administered 2011-06-19: 100 mg via ORAL
  Filled 2011-06-17: qty 2

## 2011-06-17 MED ORDER — VANCOMYCIN HCL IN DEXTROSE 1-5 GM/200ML-% IV SOLN
1000.0000 mg | Freq: Two times a day (BID) | INTRAVENOUS | Status: AC
  Administered 2011-06-18: 1000 mg via INTRAVENOUS
  Filled 2011-06-17: qty 200

## 2011-06-17 MED ORDER — METOCLOPRAMIDE HCL 10 MG PO TABS: 5.0000 mg | ORAL_TABLET | Freq: Three times a day (TID) | ORAL | Status: DC | PRN

## 2011-06-17 MED ORDER — HYDROMORPHONE HCL PF 1 MG/ML IJ SOLN: 0.2500 mg | INTRAMUSCULAR | Status: DC | PRN

## 2011-06-17 MED ORDER — ENOXAPARIN SODIUM 40 MG/0.4ML ~~LOC~~ SOLN
40.0000 mg | SUBCUTANEOUS | Status: DC
  Administered 2011-06-18 – 2011-06-20 (×3): 40 mg via SUBCUTANEOUS
  Filled 2011-06-17 (×5): qty 0.4

## 2011-06-17 MED ORDER — ONDANSETRON HCL 4 MG PO TABS: 4.0000 mg | ORAL_TABLET | Freq: Four times a day (QID) | ORAL | Status: DC | PRN

## 2011-06-17 MED ORDER — BUPIVACAINE LIPOSOME 1.3 % IJ SUSP: 20.0000 mL | Freq: Once | INTRAMUSCULAR | Status: DC

## 2011-06-17 MED ORDER — AMLODIPINE BESYLATE 5 MG PO TABS
5.0000 mg | ORAL_TABLET | Freq: Every day | ORAL | Status: DC
  Filled 2011-06-17 (×4): qty 1

## 2011-06-17 MED ORDER — TEMAZEPAM 15 MG PO CAPS: 15.0000 mg | ORAL_CAPSULE | Freq: Every evening | ORAL | Status: DC | PRN

## 2011-06-17 MED ORDER — DEXAMETHASONE SODIUM PHOSPHATE 10 MG/ML IJ SOLN
INTRAMUSCULAR | Status: DC | PRN
  Administered 2011-06-17: 10 mg via INTRAVENOUS

## 2011-06-17 MED ORDER — LIDOCAINE HCL (CARDIAC) 20 MG/ML IV SOLN
INTRAVENOUS | Status: DC | PRN
  Administered 2011-06-17: 100 mg via INTRAVENOUS

## 2011-06-17 MED ORDER — MORPHINE SULFATE 2 MG/ML IJ SOLN: 1.0000 mg | INTRAMUSCULAR | Status: DC | PRN

## 2011-06-17 MED ORDER — POTASSIUM CHLORIDE IN NACL 20-0.9 MEQ/L-% IV SOLN
INTRAVENOUS | Status: DC
  Administered 2011-06-17: 23:00:00 via INTRAVENOUS
  Filled 2011-06-17 (×2): qty 1000

## 2011-06-17 MED ORDER — DOCUSATE SODIUM 100 MG PO CAPS
100.0000 mg | ORAL_CAPSULE | Freq: Two times a day (BID) | ORAL | Status: DC
  Administered 2011-06-17 – 2011-06-20 (×6): 100 mg via ORAL

## 2011-06-17 MED ORDER — HYDROMORPHONE HCL 2 MG PO TABS
2.0000 mg | ORAL_TABLET | ORAL | Status: DC | PRN
  Administered 2011-06-18 (×2): 2 mg via ORAL
  Filled 2011-06-17 (×2): qty 1

## 2011-06-17 MED ORDER — ZOLPIDEM TARTRATE 5 MG PO TABS: 5.0000 mg | ORAL_TABLET | Freq: Every evening | ORAL | Status: DC | PRN

## 2011-06-17 MED ORDER — ALLOPURINOL 300 MG PO TABS
300.0000 mg | ORAL_TABLET | Freq: Every day | ORAL | Status: DC
  Administered 2011-06-18 – 2011-06-20 (×3): 300 mg via ORAL
  Filled 2011-06-17 (×4): qty 1

## 2011-06-17 MED ORDER — PROPOFOL 10 MG/ML IV EMUL
INTRAVENOUS | Status: DC | PRN
  Administered 2011-06-17: 200 mg via INTRAVENOUS

## 2011-06-17 MED ORDER — ACETAMINOPHEN 10 MG/ML IV SOLN
INTRAVENOUS | Status: DC | PRN
  Administered 2011-06-17: 1000 mg via INTRAVENOUS

## 2011-06-17 MED ORDER — WARFARIN SODIUM 4 MG PO TABS
4.0000 mg | ORAL_TABLET | Freq: Once | ORAL | Status: AC
  Administered 2011-06-17: 4 mg via ORAL
  Filled 2011-06-17: qty 1

## 2011-06-17 MED ORDER — PROMETHAZINE HCL 25 MG/ML IJ SOLN: 6.2500 mg | INTRAMUSCULAR | Status: DC | PRN

## 2011-06-17 MED ORDER — INDAPAMIDE 2.5 MG PO TABS
2.5000 mg | ORAL_TABLET | Freq: Every day | ORAL | Status: DC
  Administered 2011-06-18 – 2011-06-20 (×3): 2.5 mg via ORAL
  Filled 2011-06-17 (×4): qty 1

## 2011-06-17 MED ORDER — ONDANSETRON HCL 4 MG/2ML IJ SOLN
INTRAMUSCULAR | Status: DC | PRN
  Administered 2011-06-17: 4 mg via INTRAVENOUS

## 2011-06-17 SURGICAL SUPPLY — 51 items
BAG SPEC THK2 15X12 ZIP CLS (MISCELLANEOUS) ×1
BAG ZIPLOCK 12X15 (MISCELLANEOUS) ×2 IMPLANT
BIT DRILL 2.8X128 (BIT) ×2 IMPLANT
BLADE EXTENDED COATED 6.5IN (ELECTRODE) ×2 IMPLANT
BLADE SAW SAG 73X25 THK (BLADE) ×1
BLADE SAW SGTL 73X25 THK (BLADE) ×1 IMPLANT
CLOTH BEACON ORANGE TIMEOUT ST (SAFETY) ×2 IMPLANT
DECANTER SPIKE VIAL GLASS SM (MISCELLANEOUS) ×2 IMPLANT
DRAPE INCISE IOBAN 66X45 STRL (DRAPES) ×2 IMPLANT
DRAPE ORTHO SPLIT 77X108 STRL (DRAPES) ×4
DRAPE POUCH INSTRU U-SHP 10X18 (DRAPES) ×2 IMPLANT
DRAPE SURG ORHT 6 SPLT 77X108 (DRAPES) ×2 IMPLANT
DRAPE U-SHAPE 47X51 STRL (DRAPES) ×2 IMPLANT
DRSG ADAPTIC 3X8 NADH LF (GAUZE/BANDAGES/DRESSINGS) ×2 IMPLANT
DRSG MEPILEX BORDER 4X4 (GAUZE/BANDAGES/DRESSINGS) ×2 IMPLANT
DRSG MEPILEX BORDER 4X8 (GAUZE/BANDAGES/DRESSINGS) ×2 IMPLANT
DURAPREP 26ML APPLICATOR (WOUND CARE) ×2 IMPLANT
ELECT REM PT RETURN 9FT ADLT (ELECTROSURGICAL) ×2
ELECTRODE REM PT RTRN 9FT ADLT (ELECTROSURGICAL) ×1 IMPLANT
EVACUATOR 1/8 PVC DRAIN (DRAIN) ×2 IMPLANT
FACESHIELD LNG OPTICON STERILE (SAFETY) ×8 IMPLANT
GLOVE BIO SURGEON STRL SZ7.5 (GLOVE) ×2 IMPLANT
GLOVE BIO SURGEON STRL SZ8 (GLOVE) ×3 IMPLANT
GLOVE BIOGEL PI IND STRL 8 (GLOVE) ×2 IMPLANT
GLOVE BIOGEL PI INDICATOR 8 (GLOVE) ×2
GOWN STRL NON-REIN LRG LVL3 (GOWN DISPOSABLE) ×2 IMPLANT
GOWN STRL REIN XL XLG (GOWN DISPOSABLE) ×2 IMPLANT
IMMOBILIZER KNEE 20 (SOFTGOODS) ×2
IMMOBILIZER KNEE 20 THIGH 36 (SOFTGOODS) IMPLANT
KIT BASIN OR (CUSTOM PROCEDURE TRAY) ×2 IMPLANT
MANIFOLD NEPTUNE II (INSTRUMENTS) ×2 IMPLANT
NDL SAFETY ECLIPSE 18X1.5 (NEEDLE) ×1 IMPLANT
NEEDLE HYPO 18GX1.5 SHARP (NEEDLE) ×4
NS IRRIG 1000ML POUR BTL (IV SOLUTION) ×2 IMPLANT
PACK TOTAL JOINT (CUSTOM PROCEDURE TRAY) ×2 IMPLANT
PASSER SUT SWANSON 36MM LOOP (INSTRUMENTS) ×2 IMPLANT
POSITIONER SURGICAL ARM (MISCELLANEOUS) ×2 IMPLANT
SPONGE GAUZE 4X4 12PLY (GAUZE/BANDAGES/DRESSINGS) ×2 IMPLANT
STRIP CLOSURE SKIN 1/2X4 (GAUZE/BANDAGES/DRESSINGS) ×4 IMPLANT
SUT ETHIBOND NAB CT1 #1 30IN (SUTURE) ×2 IMPLANT
SUT MNCRL AB 4-0 PS2 18 (SUTURE) ×2 IMPLANT
SUT VIC AB 1 CT1 27 (SUTURE) ×2
SUT VIC AB 1 CT1 27XBRD ANTBC (SUTURE) ×2 IMPLANT
SUT VIC AB 2-0 CT1 27 (SUTURE) ×4
SUT VIC AB 2-0 CT1 TAPERPNT 27 (SUTURE) ×3 IMPLANT
SUT VLOC 180 0 24IN GS25 (SUTURE) ×4 IMPLANT
SYR 50ML LL SCALE MARK (SYRINGE) ×3 IMPLANT
TOWEL OR 17X26 10 PK STRL BLUE (TOWEL DISPOSABLE) ×4 IMPLANT
TOWEL OR NON WOVEN STRL DISP B (DISPOSABLE) ×2 IMPLANT
TRAY FOLEY CATH 14FRSI W/METER (CATHETERS) ×2 IMPLANT
WATER STERILE IRR 1500ML POUR (IV SOLUTION) ×2 IMPLANT

## 2011-06-17 NOTE — H&P (View-Only) (Signed)
[name]  DOB: [birth year] Married / Language: English / Race: White Female  Date of Admission:  06/17/2011  Chief Complaint:  Left Hip Pain  History of Present Illness The patient is a 77 year old female who comes in for a preoperative History and Physical. The patient is scheduled for a left total hip arthroplasty to be performed by Dr. Dione Plover. Aluisio, MD at [facility] on 06/17/2011. The patient is a 77 year old female who is being followed for their left hip osteoarthritis. Symptoms reported include: pain. The patient feels that they are doing poorlydue to her hips hurting. The left has not had an injection but she did therapy and that did not help at all. She reports her pain level to be moderate to severe. The following medication has been used for pain control: antiinflammatory medication in the form of celebrex and Hydrocodone which Dr Ouida Sills gives her for the shoulders and fibromyalgia. The patient has not gotten any relief of their symptoms with physical therapy. The left hip is getting progressively worse. It is limiting what she can and cannot do. With regards to the right knee, she feels great. She is real pleased with how that is doing. She has not had any intervention for the left hip. She is ready to proceed with surgical fixation of the hip at this time. They have been treated conservatively in the past for the above stated problem and despite conservative measures, they continue to have progressive pain and severe functional limitations and dysfunction. They have failed non-operative management. It is felt that they would benefit from undergoing total joint replacement. Risks and benefits of the procedure have been discussed with the patient and they elect to proceed with surgery. There are no active contraindications to surgery such as ongoing infection or rapidly progressive neurological disease.  Allergies Cymbalta *ANTIDEPRESSANTS*. Abdominal pain,  Dizziness, Nausea. Blurred vision Penicillins. Rash. Codeine/Codeine Derivatives. Nausea. ABLE TO TAKE HYDROCODONE Tetanus Toxoid Adsorbed *TOXOIDS* CIPRO. 01/05/2008 Rash.  Medication History Allopurinol (300MG Tablet, 1 Oral daily) Active. Calcium Carbonate-Vitamin D (600-200MG-UNIT Tablet, 1 Oral daily) Active. Fenofibrate (160MG Tablet, 1 Oral daily) Active. Norvasc (5MG Tablet, 1 Oral daily) Active. Indapamide (2.5MG Tablet, Oral) Active. Magnesium (200MG Tablet, 2 Oral daily) Active. Omeprazole (40MG Capsule DR, 1 Oral daily) Active. Combigan (0.2-0.5% Solution, Ophthalmic) Active. Coumadin (2MG Tablet, Oral) Active. Lasix (40MG Tablet, 0.5 Oral daily) Active. Vicodin (5-500MG Tablet, 1 Oral every six hours) Active. Loratadine (10MG Tablet, 1 Oral as needed) Active. Tylenol (500MG Capsule, Oral) Active.  Past Surgical History Gallbladder Surgery. [Insert Date into Details]. open, 1990's Hysterectomy. Date: 19. complete (non-cancerous) Cataract Surgery. Date: 2011. right Total Hip Replacement. Date: 2004. right Total Knee Replacement. Date: 2012. right ORIF Right Femur. Date: 2011. Cable Fixation Lumbar Decompression. Date: 2008. L3-S1 Dilation and Curettage of Uterus - Multiple Colon Polyp Removal - Colonoscopy Appendectomy  Problem List/Past Medical Osteoarthritis, Hip (715.35) Bursitis, Hip (726.5) Post-laminectomy Syndrome, Lumbar (722.83) Hammer toe, other, acquired (735.4). 10/28/2008 Tinnitus Cataract Hemorrhoids Hiatal Hernia Hypertension Urinary Tract Infection. Occassional History Urinary Incontinence Sleep Apnea Peripheral Neuropathy Osteoarthritis Fibromyalgia Crohn's Disease High blood pressure Gout Gastroesophageal Reflux Disease Spondylosis, lumbosacral (721.3) Scoliosis, idiopathic (737.30)  Family History Heart Disease. father and brother Diabetes Mellitus. grandfather fathers side Chronic Obstructive Lung Disease.  father Heart disease in female family member before age 27 Hypertension. father and brother  Social History Tobacco use. Former smoker. stopped 25 years ago, former smoker; smoke(d) less than 1/2 pack(s) per day Living  situation. live with spouse Illicit drug use. no Drug/Alcohol Rehab (Previously). no Marital status. married Tobacco / smoke exposure. no Pain Contract. no Number of flights of stairs before winded. less than 1 Drug/Alcohol Rehab (Currently). no Advance Directives. Livng Will Post-Surgical Plans. [place] Current occupation. RN, retired Alcohol use. Occasional alcohol use. current drinker; only occasionally per week Children. 1 Current work status. retired  Review of Systems General:Present- Fatigue and Weight Gain. Not Present- Chills, Fever, Night Sweats, Weight Loss and Memory Loss. Skin:Not Present- Hives, Itching, Rash, Eczema and Lesions. HEENT:Not Present- Tinnitus, Headache, Double Vision, Visual Loss, Hearing Loss and Dentures. Respiratory:Present- Shortness of breath with exertion, Shortness of breath at rest, Chronic Cough and Wheezing. Not Present- Allergies and Coughing up blood. Cardiovascular:Present- Leg Cramps. Not Present- Chest Pain, Racing/skipping heartbeats, Difficulty Breathing Lying Down, Murmur, Swelling and Palpitations. Gastrointestinal:Present- Heartburn. Not Present- Bloody Stool, Abdominal Pain, Vomiting, Nausea, Constipation, Diarrhea, Difficulty Swallowing, Jaundice and Loss of appetitie. Female Genitourinary:Not Present- Blood in Urine, Urinary frequency, Weak urinary stream, Discharge, Flank Pain, Incontinence, Painful Urination, Urgency, Urinary Retention and Urinating at Night. Musculoskeletal:Not Present- Muscle Weakness, Muscle Pain, Joint Swelling, Joint Pain, Back Pain, Morning Stiffness and Spasms. Neurological:Present- Tingling. Not Present- Tremor, Dizziness, Blackout spells, Paralysis, Difficulty  with balance and Weakness. Psychiatric:Not Present- Insomnia.   Vitals Weight: 232 lb Height: 62 in Body Surface Area: 2.15 m Body Mass Index: 42.43 kg/m Pulse: 68 (Regular) Resp.: 18 (Unlabored) BP: 146/78 (Sitting, Right Arm, Standard)  Physical Exam The physical exam findings are as follows: Patient is a very pleasant 77 year old female with continued hip pain.   General Mental Status - Alert, cooperative and good historian. General Appearance- pleasant. Not in acute distress. Orientation- Oriented X3. Build & Nutrition- Well nourished and Well developed.   Head and Neck Head- normocephalic, atraumatic . Neck Global Assessment- bruit auscultated on the right, bruit auscultated on the left and supple. Carotid Arteries- Bilateral- bruit (right is louder than left (Right Carotid 100% and Left Carotid 40-50% as per patient) - followed by Dr. Donnetta Hutching on regular basis.).   Eye Pupil- Bilateral- Regular and Round. Motion- Bilateral- EOMI.   Chest and Lung Exam Auscultation: Breath sounds:- clear at anterior chest wall and - clear at posterior chest wall. Adventitious sounds:- No Adventitious sounds.   Cardiovascular Auscultation:Rhythm- Regular rate and rhythm. Heart Sounds- S1 WNL and S2 WNL. Murmurs & Other Heart Sounds:Auscultation of the heart reveals - No Murmurs.   Abdomen Inspection:Contour- Generalized moderate distention. Palpation/Percussion:Tenderness- Abdomen is non-tender to palpation. Rigidity (guarding)- Abdomen is soft. Auscultation:Auscultation of the abdomen reveals - Bowel sounds normal.   Female Genitourinary Not done, not pertinent to present illness  Musculoskeletal Her right knee looks great. Range is 0-120 degrees. There is no swelling, tenderness or instability about the right knee. Right hip can be flexed to about 100, rotated in 20, out 30 and abducted 30 without discomfort. The left hip can  flex to about 100. No internal rotation, about 10 external rotation and 20 abduction.  RADIOGRAPHS: Radiographs reviewed from last visit. AP pelvis and AP and lateral of the left hip. She has advanced end stage arthritis of the left hip. Her prosthesis on the right looks good. Her knee films taken today, AP and lateral, show the prosthesis in excellent position with no periprosthetic abnormalities.  Assessment & Plan Osteoarthritis: Left Hip  Patient is for a Left Total Hip Replacement by Dr. Wynelle Link.  Plan is to [facility] after the hospital stay.  PCP - Dr. Deland Pretty Cards - Dr. Warren Danes - Patient has been seen by Dr. Mare Ferrari and felt to be at moderate risk from a cardiac standpoint in regards to her hip surgery. It is felt that it is OK to proceed without further cardiac testing. She is on chronic coumadin which will be stopped five days prior to surgery at which time she will be started on a Lovenox bridge preoperatively which has been directed by Dr. Mare Ferrari. She will be placed back on Coumadin along with Lovenox postoperatively.  Arlee Muslim, PA-C

## 2011-06-17 NOTE — Transfer of Care (Signed)
Immediate Anesthesia Transfer of Care Note  Patient: [name]  Procedure(s) Performed: Procedure(s) (LRB): TOTAL HIP ARTHROPLASTY (Left)  Patient Location: PACU  Anesthesia Type: General  Level of Consciousness: sedated, patient cooperative and responds to stimulaton  Airway & Oxygen Therapy: Patient Spontanous Breathing and Patient connected to face mask oxgen  Post-op Assessment: Report given to PACU RN and Post -op Vital signs reviewed and stable  Post vital signs: Reviewed and stable  Complications: No apparent anesthesia complications

## 2011-06-17 NOTE — Interval H&P Note (Signed)
History and Physical Interval Note:  06/17/2011 3:13 PM  [name]  has presented today for surgery, with the diagnosis of Osteoarthritis of the Left Hip  The various methods of treatment have been discussed with the patient and family. After consideration of risks, benefits and other options for treatment, the patient has consented to  Procedure(s) (LRB): TOTAL HIP ARTHROPLASTY (Left) as a surgical intervention .  The patients' history has been reviewed, patient examined, no change in status, stable for surgery.  I have reviewed the patients' chart and labs.  Questions were answered to the patient's satisfaction.     Gearlean Alf

## 2011-06-17 NOTE — Anesthesia Postprocedure Evaluation (Signed)
  Anesthesia Post-op Note  Patient: [name]  Procedure(s) Performed: Procedure(s) (LRB): TOTAL HIP ARTHROPLASTY (Left)  Patient Location: PACU  Anesthesia Type: General  Level of Consciousness: awake, alert , oriented and sedated  Airway and Oxygen Therapy: Patient Spontanous Breathing and Patient connected to face mask oxygen  Post-op Pain: none  Post-op Assessment: Post-op Vital signs reviewed, Patient's Cardiovascular Status Stable, Respiratory Function Stable, Patent Airway and No signs of Nausea or vomiting  Post-op Vital Signs: Reviewed and stable  Complications: No apparent anesthesia complications

## 2011-06-17 NOTE — Progress Notes (Signed)
ANTICOAGULATION CONSULT NOTE - Initial Consult  Pharmacy Consult for Warfarin Indication: hx of PE, VTE prophylaxis s/p THA  Allergies  Allergen Reactions  . Ciprofloxacin     RASH  . Codeine     NAUSEA  . Cymbalta (Duloxetine Hcl)     Nausea VOMITING AND ABDOMINAL PAIN, HEADACHE, JUST ABOUT EVERY SIDE EFFECT THE DRUG HAS  . Penicillins     RASH  & ITCHING   --PT STATES SHE CAN TAKE KEFLEX PO AND IV CEPHALOSPORINS  . Tetanus Toxoids     SWELLING, REDNESS  WHOLE ARM    Patient Measurements: Height: 5' 2"  (157.5 cm) Weight: 232 lb (105.235 kg) IBW/kg (Calculated) : 50.1   Vital Signs: Temp: 97.9 F (36.6 C) (05/06 1820) Temp src: Oral (05/06 1857) BP: 144/70 mmHg (05/06 1820) Pulse Rate: 67  (05/06 1820)  Labs:  Basename 06/17/11 1310  HGB 11.9*  HCT 36.5  PLT 123*  APTT 36  LABPROT 14.8  INR 1.14  HEPARINUNFRC --  CREATININE 0.78  CKTOTAL --  CKMB --  TROPONINI --   Estimated Creatinine Clearance: 70.2 ml/min (by C-G formula based on Cr of 0.78).  Medications:  Scheduled:    . acetaminophen  1,000 mg Intravenous Q6H  . allopurinol  300 mg Oral Daily  . amLODipine  5 mg Oral Daily  . brimonidine-timolol  1 drop Right Eye Q12H  . bupivacaine liposome  20 mL Infiltration Once  . docusate sodium  100 mg Oral BID  . enoxaparin  40 mg Subcutaneous Q24H  . fenofibrate  160 mg Oral Daily  . indapamide  2.5 mg Oral Daily  . loratadine  10 mg Oral Daily  . metoprolol tartrate  25 mg Oral BID  . pantoprazole  80 mg Oral Q1200  . vancomycin  1,500 mg Intravenous 60 min Pre-Op  . vancomycin  1,000 mg Intravenous Q12H  . DISCONTD: acetaminophen  1,000 mg Intravenous Once  . DISCONTD:  ceFAZolin (ANCEF) IV  2 g Intravenous 60 min Pre-Op  . DISCONTD: chlorhexidine  60 mL Topical Once    Assessment:  36 yoF on chronic anticoagulation for  hx of PE and now VTE prophylaxis s/p THA  PTA Warfarin 68m on Tues, Thurs, Sat and 249mother days. Last Dose: 06/11/2011;  Lovenox 10012mID bridge 05/07/2011 - 06/16/2011 0800  INR subtherapeutic today  Post-op condition: hemodynamically stable.    Goal of Therapy:  INR 2-3   Plan:   Warfarin 4mg46m tonight at 2000  Continue Lovenox 40mg60mly as ordered, first dose 5/7  Daily INR    ChrisGretta ArabmD, BCPS Pager [phone] 7:23 PM

## 2011-06-17 NOTE — Op Note (Signed)
Pre-operative diagnosis- Osteoarthritis Left hip  Post-operative diagnosis- Osteoarthritis  Left hip  Procedure-  LeftTotal Hip Arthroplasty  Surgeon- Dione Plover. Denetra Formoso, MD  Assistant- Arlee Muslim, PA-C   Anesthesia  General  EBL- 400   Drain Hemovac   Complication- None  Condition-PACU - hemodynamically stable.   Brief Clinical Note-  [name] is a 77 y.o. female with end stage arthritis of her left hip with progressively worsening pain and dysfunction. Pain occurs with activity and rest including pain at night. She has tried analgesics, protected weight bearing and rest without benefit. Pain is too severe to attempt physical therapy. Radiographs demonstrate bone on bone arthritis with subchondral cyst formation. She presents now for left THA.  Procedure in detail-   The patient is brought into the operating room and placed on the operating table. After successful administration of General  anesthesia, the patient is placed in the  Right lateral decubitus position with the  Left side up and held in place with the hip positioner. The lower extremity is isolated from the perineum with plastic drapes and time-out is performed by the surgical team. The lower extremity is then prepped and draped in the usual sterile fashion. A short posterolateral incision is made with a ten blade through the subcutaneous tissue to the level of the fascia lata which is incised in line with the skin incision. The sciatic nerve is palpated and protected and the short external rotators and capsule are isolated from the femur. The hip is then dislocated and the center of the femoral head is marked. A trial prosthesis is placed such that the trial head corresponds to the center of the patients' native femoral head. The resection level is marked on the femoral neck and the resection is made with an oscillating saw. The femoral head is removed and femoral retractors placed to gain access to the femoral canal.  The canal finder is passed into the femoral canal and the canal is thoroughly irrigated with sterile saline to remove the fatty contents. Axial reaming is performed to 15.5  mm, proximal reaming to 20D  and the sleeve machined to a small. A 20D small trial sleeve is placed into the proximal femur.      The femur is then retracted anteriorly to gain acetabular exposure. Acetabular retractors are placed and the labrum and osteophytes are removed, Acetabular reaming is performed to 49  mm and a 50  mm Pinnacle acetabular shell is placed in anatomic position with excellent purchase. Additional dome screws were placed. An apex hole eliminator is placed and the permanent 32 mm neutral + 4 Marathon liner is placed into the acetabular shell.      The trial femur is then placed into the femoral canal. The size is 20 x 15  stem with a 36 + 8  neck and a 32 + 0  head with the neck version matching  the patients' native anteversion. The hip is reduced with excellent stability with full extension and full external rotation, 70 degrees flexion with 40 degrees adduction and 90 degrees internal rotation and 90 degrees of flexion with 70 degrees of internal rotation. The operative leg is placed on top of the non-operative leg and the leg lengths are found to be equal. The trials are then removed and the permanent implant of the same size is impacted into the femoral canal. The  ceramic femoral head of the same size as the trial is placed and the hip is reduced with the  same stability parameters. The operative leg is again placed on top of the non-operative leg and the leg lengths are found to be equal.      The wound is then copiously irrigated with saline solution and the capsule and short external rotators are re-attached to the femur through drill holes with Ethibond suture. The fascia lata is closed over a hemovac drain with #1 V-loc suture and the fascia lata, gluteal muscles and subcutaneous tissues are injected with  Exparel 60m diluted with saline 529m The subcutaneous tissues are closed with #1 and2-0 vicryl and the subcuticular layer closed with running 4-0 Monocryl. The drain is hooked to suction, incision cleaned and dried, and steri-srips and a bulky sterile dressing applied. The limb is placed into a knee immobilizer and the patient is awakened and transported to recovery in stable condition.      Please note that a surgical assistant was a medical necessity for this procedure in order to perform it in a safe and expeditious manner. The assistant was necessary to provide retraction to the vital neurovascular structures and to retract and position the limb to allow for anatomic placement of the prosthetic components.  FrDione Ploverluisio, MD    06/17/2011, 4:51 PM

## 2011-06-17 NOTE — Anesthesia Preprocedure Evaluation (Addendum)
Anesthesia Evaluation    History of Anesthesia Complications (+) PONV  Airway Mallampati: II TM Distance: >3 FB Neck ROM: Full    Dental No notable dental hx.    Pulmonary shortness of breath, sleep apnea , Recent URI ,  breath sounds clear to auscultation  Pulmonary exam normal       Cardiovascular hypertension, Pt. on medications and Pt. on home beta blockers + dysrhythmias Rhythm:Regular Rate:Normal  Dr. Sherryl Barters clearance note from 3/13 reviewed. H/O pulmonary embolism.   Neuro/Psych  Neuromuscular disease    GI/Hepatic hiatal hernia, GERD-  Medicated,  Endo/Other    Renal/GU      Musculoskeletal  (+) Fibromyalgia -  Abdominal (+) + obese,   Peds  Hematology   Anesthesia Other Findings   Reproductive/Obstetrics                          Anesthesia Physical Anesthesia Plan  ASA: III  Anesthesia Plan: General   Post-op Pain Management:    Induction: Intravenous  Airway Management Planned: Oral ETT  Additional Equipment:   Intra-op Plan:   Post-operative Plan: Extubation in OR  Informed Consent: I have reviewed the patients History and Physical, chart, labs and discussed the procedure including the risks, benefits and alternatives for the proposed anesthesia with the patient or authorized representative who has indicated his/her understanding and acceptance.   Dental advisory given  Plan Discussed with: CRNA  Anesthesia Plan Comments: (Discussed r/b general versus spinal. Off coumadin and PT is upper normal. Lovenox yesterday. Platelets 123K. She bruised badly from IV attempt. I believe general would be safer today. She consents to general.)      Anesthesia Quick Evaluation

## 2011-06-17 NOTE — Preoperative (Signed)
Beta Blockers   Reason not to administer Beta Blockers:Not Applicable Metoprolol 09-14-23

## 2011-06-18 LAB — CBC
Hemoglobin: 9.5 g/dL — ABNORMAL LOW (ref 12.0–15.0)
Platelets: 133 10*3/uL — ABNORMAL LOW (ref 150–400)
RBC: 3.36 MIL/uL — ABNORMAL LOW (ref 3.87–5.11)
WBC: 10.6 10*3/uL — ABNORMAL HIGH (ref 4.0–10.5)

## 2011-06-18 LAB — PROTIME-INR
INR: 1.23 (ref 0.00–1.49)
Prothrombin Time: 15.8 seconds — ABNORMAL HIGH (ref 11.6–15.2)

## 2011-06-18 LAB — BASIC METABOLIC PANEL
CO2: 22 mEq/L (ref 19–32)
Chloride: 101 mEq/L (ref 96–112)
Glucose, Bld: 166 mg/dL — ABNORMAL HIGH (ref 70–99)
Potassium: 3.8 mEq/L (ref 3.5–5.1)
Sodium: 136 mEq/L (ref 135–145)

## 2011-06-18 MED ORDER — SODIUM CHLORIDE 0.9 % IV SOLN
INTRAVENOUS | Status: DC
  Administered 2011-06-19: 04:00:00 via INTRAVENOUS

## 2011-06-18 MED ORDER — WARFARIN SODIUM 4 MG PO TABS
4.0000 mg | ORAL_TABLET | Freq: Once | ORAL | Status: AC
  Administered 2011-06-18: 4 mg via ORAL
  Filled 2011-06-18: qty 1

## 2011-06-18 MED ORDER — POLYSACCHARIDE IRON COMPLEX 150 MG PO CAPS
150.0000 mg | ORAL_CAPSULE | Freq: Every day | ORAL | Status: DC
  Administered 2011-06-18 – 2011-06-19 (×2): 150 mg via ORAL
  Filled 2011-06-18 (×2): qty 1

## 2011-06-18 MED ORDER — SODIUM CHLORIDE 0.9 % IV SOLN
INTRAVENOUS | Status: AC
  Administered 2011-06-18: 12:00:00 via INTRAVENOUS

## 2011-06-18 MED ORDER — NON FORMULARY: 40.0000 mg | Freq: Every day | Status: DC

## 2011-06-18 MED ORDER — OMEPRAZOLE 20 MG PO CPDR
40.0000 mg | DELAYED_RELEASE_CAPSULE | Freq: Every day | ORAL | Status: DC
  Administered 2011-06-18: 40 mg via ORAL
  Filled 2011-06-18 (×3): qty 2

## 2011-06-18 NOTE — Progress Notes (Signed)
Physical Therapy Treatment Patient Details Name: [name] MRN: [id number] DOB: 04-27-1934 Today's Date: 06/18/2011 Time: 1355-1415 PT Time Calculation (min): 20 min  PT Assessment / Plan / Recommendation Comments on Treatment Session  Pt continues ltd by pain as well as PWB status with significant obesity    Follow Up Recommendations  Skilled nursing facility    Equipment Recommendations  Defer to next venue    Frequency 7X/week   Plan Discharge plan remains appropriate    Precautions / Restrictions Precautions Precautions: Posterior Hip Precaution Comments: sign hung in room Restrictions Weight Bearing Restrictions: Yes LLE Weight Bearing: Partial weight bearing LLE Partial Weight Bearing Percentage or Pounds: 25-50%   Pertinent Vitals/Pain     Mobility  Bed Mobility Bed Mobility: Sit to Supine Supine to Sit: 1: +2 Total assist Supine to Sit: Patient Percentage: 50% Sit to Supine: 1: +2 Total assist Sit to Supine: Patient Percentage: 40% Details for Bed Mobility Assistance: cues for sequence and for use of UEs to self assist - physical assist for L LE and to bring upper body erect Transfers Transfers: Sit to Stand;Stand to Sit Sit to Stand: 1: +2 Total assist Sit to Stand: Patient Percentage: 60% Stand to Sit: 1: +2 Total assist Stand to Sit: Patient Percentage: 60% Details for Transfer Assistance: cues for LE management and for use of UEs to self assist Ambulation/Gait Ambulation/Gait Assistance: 1: +2 Total assist Ambulation/Gait: Patient Percentage: 60 Ambulation Distance (Feet): 2 Feet Assistive device: Rolling walker Ambulation/Gait Assistance Details: cues for posture, UE WB, sequence and position from RW Gait Pattern: Step-to pattern    Exercises Total Joint Exercises Ankle Circles/Pumps: 10 reps;Supine;AROM;Both Quad Sets: AROM;Both;10 reps;Supine Heel Slides: AAROM;10 reps;Supine;Left Hip ABduction/ADduction: AAROM;10 reps;Left;Supine   PT  Goals Acute Rehab PT Goals PT Goal Formulation: With patient Time For Goal Achievement: 06/25/11 Potential to Achieve Goals: Fair Pt will go Supine/Side to Sit: with min assist PT Goal: Supine/Side to Sit - Progress: Goal set today Pt will go Sit to Supine/Side: with min assist PT Goal: Sit to Supine/Side - Progress: Goal set today Pt will go Sit to Stand: with min assist PT Goal: Sit to Stand - Progress: Goal set today Pt will go Stand to Sit: with min assist PT Goal: Stand to Sit - Progress: Goal set today Pt will Ambulate: 1 - 15 feet;with rolling walker;with +2 total assist PT Goal: Ambulate - Progress: Goal set today  Visit Information  Last PT Received On: 06/18/11 Assistance Needed: +2    Subjective Data  Subjective: My blood pressure is a little low and I feel just a little lightheaded Patient Stated Goal: I want to go for walks again   Cognition  Overall Cognitive Status: Appears within functional limits for tasks assessed/performed Arousal/Alertness: Awake/alert Orientation Level: Appears intact for tasks assessed Behavior During Session: [facility] for tasks performed    Balance     End of Session PT - End of Session Equipment Utilized During Treatment: Gait belt Activity Tolerance: Patient limited by fatigue;Patient limited by pain Patient left: in bed;with call bell/phone within reach;with family/visitor present Nurse Communication: Mobility status    Auther Lyerly 06/18/2011, 3:19 PM

## 2011-06-18 NOTE — Evaluation (Signed)
Physical Therapy Evaluation Patient Details Name: [name] MRN: [id number] DOB: 1934-12-28 Today's Date: 06/18/2011 Time: 4782-9562 PT Time Calculation (min): 37 min  PT Assessment / Plan / Recommendation Clinical Impression  Pt with L THR presents with decreased L LE strength/ROM, significant premorbid deconditioning, obesity, PWB status on L LE and limiations in functional mobility    PT Assessment  Patient needs continued PT services    Follow Up Recommendations  Skilled nursing facility    Equipment Recommendations  Defer to next venue    Frequency 7X/week    Precautions / Restrictions Precautions Precautions: Posterior Hip Precaution Comments: sign hung in room Restrictions Weight Bearing Restrictions: Yes LLE Weight Bearing: Partial weight bearing LLE Partial Weight Bearing Percentage or Pounds: 25-50%         Mobility  Bed Mobility Bed Mobility: Supine to Sit Supine to Sit: 1: +2 Total assist Supine to Sit: Patient Percentage: 50% Details for Bed Mobility Assistance: cues for sequence and for use of UEs to self assist - physical assist for L LE and to bring upper body erect Transfers Transfers: Sit to Stand;Stand to Sit Sit to Stand: 1: +2 Total assist Sit to Stand: Patient Percentage: 60% Stand to Sit: 1: +2 Total assist Stand to Sit: Patient Percentage: 60% Details for Transfer Assistance: cues for LE management and for use of UEs to self assist Ambulation/Gait Ambulation/Gait Assistance: 1: +2 Total assist Ambulation/Gait: Patient Percentage: 60 Ambulation Distance (Feet): 2 Feet Assistive device: Rolling walker Ambulation/Gait Assistance Details: cues for sequence, posture, position from RW and increased UE WB Gait Pattern: Step-to pattern;Decreased step length - right;Decreased step length - left;Decreased stance time - right    Exercises Total Joint Exercises Ankle Circles/Pumps: 10 reps;Supine;AROM;Both Quad Sets: AROM;Both;10 reps;Supine Heel  Slides: AAROM;10 reps;Supine;Left Hip ABduction/ADduction: AAROM;10 reps;Left;Supine   PT Goals Acute Rehab PT Goals PT Goal Formulation: With patient Time For Goal Achievement: 06/25/11 Potential to Achieve Goals: Fair Pt will go Supine/Side to Sit: with min assist PT Goal: Supine/Side to Sit - Progress: Goal set today Pt will go Sit to Supine/Side: with min assist PT Goal: Sit to Supine/Side - Progress: Goal set today Pt will go Sit to Stand: with min assist PT Goal: Sit to Stand - Progress: Goal set today Pt will go Stand to Sit: with min assist PT Goal: Stand to Sit - Progress: Goal set today Pt will Ambulate: 1 - 15 feet;with rolling walker;with +2 total assist PT Goal: Ambulate - Progress: Goal set today  Visit Information  Last PT Received On: 06/18/11 Assistance Needed: +2    Subjective Data  Subjective: This is going to be hard because this was my good leg Patient Stated Goal: I want to go for walks again   Prior Functioning  Home Living Lives With: Spouse Available Help at Discharge: Family Prior Function Level of Independence: Independent with assistive device(s) Able to Take Stairs?: Yes Communication Communication: No difficulties    Cognition  Overall Cognitive Status: Appears within functional limits for tasks assessed/performed Arousal/Alertness: Awake/alert Orientation Level: Appears intact for tasks assessed Behavior During Session: [facility] for tasks performed    Extremity/Trunk Assessment Right Upper Extremity Assessment RUE ROM/Strength/Tone: [facility] for tasks assessed Left Upper Extremity Assessment LUE ROM/Strength/Tone: [facility] for tasks assessed Right Lower Extremity Assessment RLE ROM/Strength/Tone: [facility] for tasks assessed Left Lower Extremity Assessment LLE ROM/Strength/Tone: Deficits LLE ROM/Strength/Tone Deficits: 2+/5 hip, 3/5 quads; AAROM hip flex limited by discomfort to 60   Balance    End of Session  PT - End of Session Equipment Utilized  During Treatment: Gait belt Activity Tolerance: Patient limited by fatigue;Patient limited by pain Patient left: in chair;with family/visitor present;with call bell/phone within reach Nurse Communication: Mobility status   [name] 06/18/2011, 3:14 PM

## 2011-06-18 NOTE — Plan of Care (Signed)
Problem: Consults Goal: Diagnosis- Total Joint Replacement Primary Total Hip LEFT

## 2011-06-18 NOTE — Progress Notes (Signed)
Subjective: 1 Day Post-Op Procedure(s) (LRB): TOTAL HIP ARTHROPLASTY (Left) Patient reports pain as mild.  Husband in room this morning. Patient seen in rounds with Dr. Wynelle Link. Patient is has not had to take anything for pain yet this morning. We will start therapy today.  Plan is to go Skilled nursing facility after hospital stay.  Objective: Vital signs in last 24 hours: Temp:  [97.3 F (36.3 C)-98.9 F (37.2 C)] 98.6 F (37 C) (05/07 0553) Pulse Rate:  [66-86] 75  (05/07 0553) Resp:  [13-20] 14  (05/07 0553) BP: (92-163)/(50-70) 119/64 mmHg (05/07 0553) SpO2:  [92 %-99 %] 92 % (05/07 0553) Weight:  [105.235 kg (232 lb)] 105.235 kg (232 lb) (05/06 1857)  Intake/Output from previous day:  Intake/Output Summary (Last 24 hours) at 06/18/11 0751 Last data filed at 06/18/11 0554  Gross per 24 hour  Intake   1820 ml  Output   2275 ml  Net   -455 ml    Intake/Output this shift: 950 since MN  Labs:  Basename 06/18/11 0405 06/17/11 1310  HGB 9.5* 11.9*    Basename 06/18/11 0405 06/17/11 1310  WBC 10.6* 6.0  RBC 3.36* 4.21  HCT 28.9* 36.5  PLT 133* 123*    Basename 06/18/11 0405 06/17/11 1310  NA 136 138  K 3.8 3.6  CL 101 102  CO2 22 25  BUN 16 18  CREATININE 0.77 0.78  GLUCOSE 166* 108*  CALCIUM 8.3* 9.2    Basename 06/18/11 0405 06/17/11 1310  LABPT -- --  INR 1.23 1.14    EXAM General - Patient is Alert, Appropriate and Oriented Extremity - Neurovascular intact Sensation intact distally Dressing - dressing C/D/I Motor Function - intact, moving foot and toes well on exam.  Hemovac pulled without difficulty.  Past Medical History  Diagnosis Date  . Hypertension   . Hyperlipidemia   . Fibromyalgia   . Gout   . Tinnitus   . Obstructive sleep apnea on CPAP   . Acid reflux disease   . Pulmonary embolism     history of a postsurgical pulmonary embolism  . Vertigo   . Crohn disease   . Long term current use of anticoagulant   . Sleep apnea    DOES NOT KNOW SETTINGS - USES CPAP WHEN SLEEPING  . GERD (gastroesophageal reflux disease)   . Shortness of breath     WITH EXERTION--PT STATES LACK OF EXERCISE AND WEIGHT GAIN  . Recurrent upper respiratory infection (URI)     BRONCHITIS FEB 2013--SLIGHT COUGH NON-PRODUCTIVE NOW  . H/O hiatal hernia   . Peripheral vascular disease     KNOWN RIGHT INTERNAL CAROTID ARTERY OCCLUSION (NO STROKE)  --40 TO 59% STENOSIS LEFT ICA-FOLLOWED BY DR. EARLY WITH DOPPLER STUDY EVERY 6 MONTHS  . PVC (premature ventricular contraction)     PT STATES HX OF PVC'S ON EKG  . PONV (postoperative nausea and vomiting)   . Osteoarthritis     PAIN AND OA LEF T HIP AND BOTH SHOULDERS ARE BONE ON BONE AND PAINFUL    Assessment/Plan: 1 Day Post-Op Procedure(s) (LRB): TOTAL HIP ARTHROPLASTY (Left) Principal Problem:  *OA (osteoarthritis) of hip   Advance diet Up with therapy Continue foley due to strict I&O and urinary output monitoring Discharge to SNF  DVT Prophylaxis - Lovenox and Coumadin Partial-Weight Bearing 25-50% left Leg D/C Knee Immobilizer Hemovac Pulled Begin Therapy Hip Preacutions Keep foley until tomorrow. No vaccines.  Fusae Florio 06/18/2011, 7:51 AM

## 2011-06-18 NOTE — Progress Notes (Signed)
Clinical Social Work Department CLINICAL SOCIAL WORK PLACEMENT NOTE 06/18/2011  Patient:  [name], [name]  Account Number:  [id number] Admit date:  06/17/2011  Clinical Social Worker:  Werner Lean, LCSW  Date/time:  06/18/2011 01:37 PM  Clinical Social Work is seeking post-discharge placement for this patient at the following level of care:   SKILLED NURSING   (*CSW will update this form in Epic as items are completed)     Patient/family provided with [place] Department of Clinical Social Work's list of facilities offering this level of care within the geographic area requested by the patient (or if unable, by the patient's family).    Patient/family informed of their freedom to choose among providers that offer the needed level of care, that participate in Medicare, Medicaid or managed care program needed by the patient, have an available bed and are willing to accept the patient.    Patient/family informed of [facility]' ownership interest in [facility], as well as of the fact that they are under no obligation to receive care at this facility.  PASARR submitted to EDS on  PASARR number received from EDS on 02/20/2010  FL2 transmitted to all facilities in geographic area requested by pt/family on  06/18/2011 FL2 transmitted to all facilities within larger geographic area on   Patient informed that his/her managed care company has contracts with or will negotiate with  certain facilities, including the following:     Patient/family informed of bed offers received:  06/18/2011 Patient chooses bed at [place] Physician recommends and patient chooses bed at    Patient to be transferred to [place] on   Patient to be transferred to facility by   The following physician request were entered in Epic:   Additional Comments: Pt has made prior arrangements to have ST rehab at [facility].  Werner Lean  LCSW [phone]

## 2011-06-18 NOTE — Progress Notes (Signed)
ANTICOAGULATION CONSULT NOTE - Follow-Up  Pharmacy Consult for Warfarin Indication: hx of PE, VTE prophylaxis s/p THA  Allergies  Allergen Reactions  . Ciprofloxacin     RASH  . Codeine     NAUSEA  . Cymbalta (Duloxetine Hcl)     Nausea VOMITING AND ABDOMINAL PAIN, HEADACHE, JUST ABOUT EVERY SIDE EFFECT THE DRUG HAS  . Penicillins     RASH  & ITCHING   --PT STATES SHE CAN TAKE KEFLEX PO AND IV CEPHALOSPORINS  . Tetanus Toxoids     SWELLING, REDNESS  WHOLE ARM    Patient Measurements: Height: 5' 2"  (157.5 cm) Weight: 232 lb (105.235 kg) IBW/kg (Calculated) : 50.1   Vital Signs: Temp: 98.6 F (37 C) (05/07 0553) Temp src: Oral (05/07 0553) BP: 119/64 mmHg (05/07 0553) Pulse Rate: 75  (05/07 0553)  Labs:  Basename 06/18/11 0405 06/17/11 1310  HGB 9.5* 11.9*  HCT 28.9* 36.5  PLT 133* 123*  APTT -- 36  LABPROT 15.8* 14.8  INR 1.23 1.14  HEPARINUNFRC -- --  CREATININE 0.77 0.78  CKTOTAL -- --  CKMB -- --  TROPONINI -- --   Estimated Creatinine Clearance: 70.2 ml/min (by C-G formula based on Cr of 0.77).  Medications:  Scheduled:     . acetaminophen  1,000 mg Intravenous Q6H  . allopurinol  300 mg Oral Daily  . amLODipine  5 mg Oral Daily  . brimonidine  1 drop Right Eye Q12H   And  . timolol  1 drop Right Eye Q12H  . bupivacaine liposome  20 mL Infiltration Once  . docusate sodium  100 mg Oral BID  . enoxaparin  40 mg Subcutaneous Q24H  . fenofibrate  160 mg Oral Daily  . indapamide  2.5 mg Oral Daily  . loratadine  10 mg Oral Daily  . metoprolol tartrate  25 mg Oral BID  . pantoprazole  80 mg Oral Q1200  . vancomycin  1,500 mg Intravenous 60 min Pre-Op  . vancomycin  1,000 mg Intravenous Q12H  . warfarin  4 mg Oral Once  . Warfarin - Pharmacist Dosing Inpatient   Does not apply q1800  . DISCONTD: acetaminophen  1,000 mg Intravenous Once  . DISCONTD: brimonidine-timolol  1 drop Right Eye Q12H  . DISCONTD:  ceFAZolin (ANCEF) IV  2 g Intravenous 60 min  Pre-Op  . DISCONTD: chlorhexidine  60 mL Topical Once   Inpatient warfarin doses: 43m on 5/6.   Assessment:  77yo F on chronic anticoagulation for  hx of PE and now VTE prophylaxis s/p THA  PTA Warfarin 463mon Tues, Thurs, Sat and 88m56mther days. Last Dose: 06/11/2011; Lovenox 100m788mD bridge 05/07/2011 - 06/16/2011 0800  Warfarin re-initiation in progress.  Orders for postoperative prophylactic-dose LMWH noted.    Goal of Therapy:  INR 2-3   Plan:   Repeat warfarin 4mg 77mtonight x 1  Begin Lovenox 40mg 29my starting today, as ordered  Daily INR  Randy Clayburn PertmD, BCPS Pager: [phone]  7:06 AM

## 2011-06-18 NOTE — Progress Notes (Signed)
Clinical Social Work Department BRIEF PSYCHOSOCIAL ASSESSMENT 06/18/2011  Patient:  [name], [name]     Account Number:  [id number]     Admit date:  06/17/2011  Clinical Social Worker:  Lacie Scotts  Date/Time:  06/18/2011 01:30 PM  Referred by:  Physician  Date Referred:  06/18/2011 Referred for  SNF Placement   Other Referral:   Interview type:  Patient Other interview type:    PSYCHOSOCIAL DATA Living Status:  HUSBAND Admitted from facility:   Level of care:   Primary support name:  Wyatt Mage Primary support relationship to patient:  SPOUSE Degree of support available:   supportive    CURRENT CONCERNS Current Concerns  Post-Acute Placement   Other Concerns:    SOCIAL WORK ASSESSMENT / PLAN Pt is a 77 yr old female living at home prior to hospitalization. Met with pt and spouse to assist with d/c planning. Pt has made prior arrangements to have ST SNF placement at [facility] upon d/c. SNF has confirmed plan. CSW will follow to assist with d/c planning to [facility].   Assessment/plan status:  Psychosocial Support/Ongoing Assessment of Needs Other assessment/ plan:   Information/referral to community resources:    PATIENT'S/FAMILY'S RESPONSE TO PLAN OF CARE: Pt plans to have ST rehab at [facility].   Werner Lean LCSW [phone]

## 2011-06-18 NOTE — Care Management Note (Signed)
    Page 1 of 2   06/18/2011     4:43:54 PM   CARE MANAGEMENT NOTE 06/18/2011  Patient:  [name], [name]   Account Number:  [id number]  Date Initiated:  06/18/2011  Documentation initiated by:  Sherrin Daisy  Subjective/Objective Assessment:   dx osteoarthritis left hip; total hip replacemnt     Action/Plan:   Prior arrangements were made for patient to go to SNF rehab after surgery and d/c from hospital   Anticipated DC Date:  06/20/2011   Anticipated DC Plan:  [place]  In-house referral  Clinical Social Worker      [place]  CM consult      [facility] Choice  NA   Choice offered to / List presented to:  NA   DME arranged  NA      DME agency  NA     [place] arranged  NA      [place] agency  NA   Status of service:  Completed, signed off Medicare Important Message given?  NA - LOS <3 / Initial given by admissions (If response is "NO", the following Medicare IM given date fields will be blank) Date Medicare IM given:   Date Additional Medicare IM given:    Discharge Disposition:    Per UR Regulation:    If discussed at Long Length of Stay Meetings, dates discussed:    Comments:

## 2011-06-19 ENCOUNTER — Encounter (HOSPITAL_COMMUNITY): Payer: Self-pay | Admitting: Orthopedic Surgery

## 2011-06-19 LAB — BASIC METABOLIC PANEL
BUN: 17 mg/dL (ref 6–23)
Calcium: 8.2 mg/dL — ABNORMAL LOW (ref 8.4–10.5)
Creatinine, Ser: 0.94 mg/dL (ref 0.50–1.10)
GFR calc Af Amer: 68 mL/min — ABNORMAL LOW (ref >90)
GFR calc non Af Amer: 58 mL/min — ABNORMAL LOW (ref >90)
Glucose, Bld: 118 mg/dL — ABNORMAL HIGH (ref 70–99)
Potassium: 3.5 mEq/L (ref 3.5–5.1)

## 2011-06-19 LAB — CBC
HCT: 26.7 % — ABNORMAL LOW (ref 36.0–46.0)
Hemoglobin: 8.7 g/dL — ABNORMAL LOW (ref 12.0–15.0)
MCH: 28.6 pg (ref 26.0–34.0)
MCHC: 32.6 g/dL (ref 30.0–36.0)
MCV: 87.8 fL (ref 78.0–100.0)
RDW: 15.6 % — ABNORMAL HIGH (ref 11.5–15.5)

## 2011-06-19 MED ORDER — WARFARIN SODIUM 4 MG PO TABS
4.0000 mg | ORAL_TABLET | Freq: Once | ORAL | Status: AC
  Administered 2011-06-19: 4 mg via ORAL
  Filled 2011-06-19 (×2): qty 1

## 2011-06-19 MED ORDER — HYDROCODONE-ACETAMINOPHEN 5-325 MG PO TABS
1.0000 | ORAL_TABLET | ORAL | Status: DC | PRN
  Administered 2011-06-19 – 2011-06-20 (×4): 2 via ORAL
  Administered 2011-06-20: 1 via ORAL
  Filled 2011-06-19 (×5): qty 2

## 2011-06-19 MED ORDER — POLYSACCHARIDE IRON COMPLEX 150 MG PO CAPS
150.0000 mg | ORAL_CAPSULE | Freq: Two times a day (BID) | ORAL | Status: DC
  Administered 2011-06-19 – 2011-06-20 (×2): 150 mg via ORAL
  Filled 2011-06-19 (×3): qty 1

## 2011-06-19 NOTE — Progress Notes (Signed)
ANTICOAGULATION CONSULT NOTE - Follow-Up  Pharmacy Consult for Warfarin Indication: hx of PE, VTE prophylaxis s/p THA  Allergies  Allergen Reactions  . Ciprofloxacin     RASH  . Codeine     NAUSEA  . Cymbalta (Duloxetine Hcl)     Nausea VOMITING AND ABDOMINAL PAIN, HEADACHE, JUST ABOUT EVERY SIDE EFFECT THE DRUG HAS  . Penicillins     RASH  & ITCHING   --PT STATES SHE CAN TAKE KEFLEX PO AND IV CEPHALOSPORINS  . Tetanus Toxoids     SWELLING, REDNESS  WHOLE ARM    Patient Measurements: Height: 5' 2"  (157.5 cm) Weight: 232 lb (105.235 kg) IBW/kg (Calculated) : 50.1   Vital Signs: Temp: 99.4 F (37.4 C) (05/08 0602) Temp src: Oral (05/08 0602) BP: 131/69 mmHg (05/08 0602) Pulse Rate: 79  (05/08 0602)  Labs:  Basename 06/19/11 0420 06/18/11 0405 06/17/11 1310  HGB 8.7* 9.5* --  HCT 26.7* 28.9* 36.5  PLT 113* 133* 123*  APTT -- -- 36  LABPROT 16.5* 15.8* 14.8  INR 1.31 1.23 1.14  HEPARINUNFRC -- -- --  CREATININE 0.94 0.77 0.78  CKTOTAL -- -- --  CKMB -- -- --  TROPONINI -- -- --   Estimated Creatinine Clearance: 59.8 ml/min (by C-G formula based on Cr of 0.94).  Medications:  Scheduled:     . acetaminophen  1,000 mg Intravenous Q6H  . allopurinol  300 mg Oral Daily  . amLODipine  5 mg Oral Daily  . brimonidine  1 drop Right Eye Q12H   And  . timolol  1 drop Right Eye Q12H  . docusate sodium  100 mg Oral BID  . enoxaparin  40 mg Subcutaneous Q24H  . fenofibrate  160 mg Oral Daily  . indapamide  2.5 mg Oral Daily  . iron polysaccharides  150 mg Oral Daily  . loratadine  10 mg Oral Daily  . metoprolol tartrate  25 mg Oral BID  . omeprazole  40 mg Oral Q1200  . warfarin  4 mg Oral ONCE-1800  . Warfarin - Pharmacist Dosing Inpatient   Does not apply q1800   Inpatient warfarin doses: 68m on 5/6.   Assessment:  77yo F on chronic anticoagulation for  hx of PE and now VTE prophylaxis s/p THA  PTA Warfarin 75mon Tues, Thurs, Sat and 75m43mther days. Last  Dose: 06/11/2011; Lovenox 100m16mD bridge 05/07/2011 - 06/16/2011 0800  Warfarin re-initiation in progress.  Orders for postoperative prophylactic-dose LMWH noted.    Goal of Therapy:  INR 2-3   Plan:   Repeat warfarin 4mg 74mtonight x 1  Continue Lovenox 40mg 55my, as ordered per MD - d/c when INR >1.8  Daily INR   Emerita Berkemeier WPeggyann JubamD, BCPS Pager: [phone]  8:23 AM

## 2011-06-19 NOTE — Progress Notes (Signed)
Physical Therapy Treatment Patient Details Name: JINNA WEINMAN MRN: [id number] DOB: 1934/12/17 Today's Date: 06/19/2011 Time: 8366-2947 PT Time Calculation (min): 21 min  PT Assessment / Plan / Recommendation Comments on Treatment Session  Pt much calmer this pm    Follow Up Recommendations  Skilled nursing facility    Equipment Recommendations  Defer to next venue    Frequency 7X/week   Plan Discharge plan remains appropriate    Precautions / Restrictions Precautions Precautions: Posterior Hip Precaution Comments: pt recalls all THP with min cues Restrictions Weight Bearing Restrictions: Yes LLE Weight Bearing: Partial weight bearing LLE Partial Weight Bearing Percentage or Pounds: 25-50%   Pertinent Vitals/Pain     Mobility  Bed Mobility Bed Mobility: Sit to Supine Supine to Sit: 1: +2 Total assist Supine to Sit: Patient Percentage: 60% Sit to Supine: 1: +2 Total assist Sit to Supine: Patient Percentage: 60% Details for Bed Mobility Assistance: cues for sequence and for use of UEs to self assist - physical assist for L LE and to bring upper body erect Transfers Transfers: Sit to Stand;Stand to Sit Sit to Stand: 1: +2 Total assist Sit to Stand: Patient Percentage: 70% Stand to Sit: 1: +2 Total assist Stand to Sit: Patient Percentage: 60% Details for Transfer Assistance: cues for LE management and for use of UEs to self assist Ambulation/Gait Ambulation/Gait Assistance: 1: +2 Total assist Ambulation/Gait: Patient Percentage: 70 Ambulation Distance (Feet): 8 Feet Assistive device: Rolling walker Ambulation/Gait Assistance Details: Cues for sequence, posture, position from RW, ER on L , and increased UE WB Gait Pattern: Step-to pattern Gait velocity: slow    Exercises     PT Goals Acute Rehab PT Goals PT Goal Formulation: With patient Time For Goal Achievement: 06/25/11 Potential to Achieve Goals: Fair Pt will go Supine/Side to Sit: with min assist PT Goal:  Supine/Side to Sit - Progress: Progressing toward goal Pt will go Sit to Supine/Side: with min assist PT Goal: Sit to Supine/Side - Progress: Progressing toward goal Pt will go Sit to Stand: with min assist PT Goal: Sit to Stand - Progress: Progressing toward goal Pt will go Stand to Sit: with min assist PT Goal: Stand to Sit - Progress: Progressing toward goal Pt will Ambulate: 1 - 15 feet;with rolling walker;with +2 total assist PT Goal: Ambulate - Progress: Progressing toward goal  Visit Information  Last PT Received On: 06/19/11 Assistance Needed: +2    Subjective Data  Subjective: I just need to lie down, this is hard on my back Patient Stated Goal: I want to go for walks again   Cognition  Overall Cognitive Status: Appears within functional limits for tasks assessed/performed Arousal/Alertness: Awake/alert Orientation Level: Appears intact for tasks assessed Behavior During Session: [facility] for tasks performed    Balance     End of Session PT - End of Session Equipment Utilized During Treatment: Gait belt Activity Tolerance: Patient limited by fatigue;Patient limited by pain Patient left: in bed;with call bell/phone within reach;with family/visitor present Nurse Communication: Mobility status    Adah Stoneberg 06/19/2011, 2:38 PM

## 2011-06-19 NOTE — Progress Notes (Signed)
Subjective: 2 Days Post-Op Procedure(s) (LRB): TOTAL HIP ARTHROPLASTY (Left) Patient reports pain as mild and moderate.   Patient seen in rounds with Dr. Wynelle Link. Patient is well, but has had some minor complaints of pain in the hip, requiring pain medications Plan is to go Skilled nursing facility after hospital stay [facility]. Patient's husband in the room with her today.  Objective: Vital signs in last 24 hours: Temp:  [97.8 F (36.6 C)-99.4 F (37.4 C)] 99.4 F (37.4 C) (05/08 0602) Pulse Rate:  [70-83] 79  (05/08 0602) Resp:  [14-16] 16  (05/08 0602) BP: (91-139)/(58-71) 131/69 mmHg (05/08 0602) SpO2:  [94 %-97 %] 95 % (05/08 0602)  Intake/Output from previous day:  Intake/Output Summary (Last 24 hours) at 06/19/11 1156 Last data filed at 06/19/11 1000  Gross per 24 hour  Intake 1425.83 ml  Output   2500 ml  Net -1074.17 ml    Intake/Output this shift: Total I/O In: -  Out: 650 [Urine:650]  Labs:  [facility] 06/19/11 0420 06/18/11 0405 06/17/11 1310  HGB 8.7* 9.5* 11.9*    Basename 06/19/11 0420 06/18/11 0405  WBC 7.9 10.6*  RBC 3.04* 3.36*  HCT 26.7* 28.9*  PLT 113* 133*    Basename 06/19/11 0420 06/18/11 0405  NA 139 136  K 3.5 3.8  CL 102 101  CO2 27 22  BUN 17 16  CREATININE 0.94 0.77  GLUCOSE 118* 166*  CALCIUM 8.2* 8.3*    Basename 06/19/11 0420 06/18/11 0405  LABPT -- --  INR 1.31 1.23    EXAM General - Patient is Alert, Appropriate and Oriented Extremity - Neurovascular intact Sensation intact distally Dressing/Incision - clean, dry, no drainage, healing Motor Function - intact, moving foot and toes well on exam.   Past Medical History  Diagnosis Date  . Hypertension   . Hyperlipidemia   . Fibromyalgia   . Gout   . Tinnitus   . Obstructive sleep apnea on CPAP   . Acid reflux disease   . Pulmonary embolism     history of a postsurgical pulmonary embolism  . Vertigo   . Crohn disease   . Long term current use of  anticoagulant   . Sleep apnea     DOES NOT KNOW SETTINGS - USES CPAP WHEN SLEEPING  . GERD (gastroesophageal reflux disease)   . Shortness of breath     WITH EXERTION--PT STATES LACK OF EXERCISE AND WEIGHT GAIN  . Recurrent upper respiratory infection (URI)     BRONCHITIS FEB 2013--SLIGHT COUGH NON-PRODUCTIVE NOW  . H/O hiatal hernia   . Peripheral vascular disease     KNOWN RIGHT INTERNAL CAROTID ARTERY OCCLUSION (NO STROKE)  --40 TO 59% STENOSIS LEFT ICA-FOLLOWED BY DR. EARLY WITH DOPPLER STUDY EVERY 6 MONTHS  . PVC (premature ventricular contraction)     PT STATES HX OF PVC'S ON EKG  . PONV (postoperative nausea and vomiting)   . Osteoarthritis     PAIN AND OA LEF T HIP AND BOTH SHOULDERS ARE BONE ON BONE AND PAINFUL    Assessment/Plan: 2 Days Post-Op Procedure(s) (LRB): TOTAL HIP ARTHROPLASTY (Left) Principal Problem:  *OA (osteoarthritis) of hip   Up with therapy Continue foley due to strict I&O and urinary output monitoring Plan for discharge tomorrow Discharge to SNF  DVT Prophylaxis - Lovenox and Coumadin Partial-Weight Bearing 25-50% left Leg  Janyah Singleterry 06/19/2011, 11:56 AM

## 2011-06-19 NOTE — Progress Notes (Signed)
Physical Therapy Treatment Patient Details Name: [name] MRN: [id number] DOB: [birth year] Today's Date: 06/19/2011 Time: 1151-1208 PT Time Calculation (min): 17 min  PT Assessment / Plan / Recommendation Comments on Treatment Session  Pt very emotional this am - stating "I have to do better but I just can't"    Follow Up Recommendations  Skilled nursing facility    Equipment Recommendations  Defer to next venue    Frequency 7X/week   Plan Discharge plan remains appropriate    Precautions / Restrictions Precautions Precautions: Posterior Hip Precaution Comments: sign hung in room Restrictions Weight Bearing Restrictions: Yes LLE Weight Bearing: Partial weight bearing LLE Partial Weight Bearing Percentage or Pounds: 25-50%       Mobility  Bed Mobility Bed Mobility: Sit to Supine Supine to Sit: 1: +2 Total assist Supine to Sit: Patient Percentage: 60% Details for Bed Mobility Assistance: cues for sequence and for use of UEs to self assist - physical assist for L LE and to bring upper body erect Transfers Transfers: Sit to Stand;Stand to Sit Sit to Stand: 1: +2 Total assist Sit to Stand: Patient Percentage: 60% Stand to Sit: 1: +2 Total assist Stand to Sit: Patient Percentage: 70% Details for Transfer Assistance: cues for LE management and for use of UEs to self assist Ambulation/Gait Ambulation/Gait Assistance: 1: +2 Total assist Ambulation/Gait: Patient Percentage: 70 Ambulation Distance (Feet): 3 Feet Assistive device: Rolling walker Ambulation/Gait Assistance Details: cues for sequence, posture, increased UE WB and position from RW Gait Pattern: Step-to pattern    Exercises     PT Goals Acute Rehab PT Goals PT Goal Formulation: With patient Time For Goal Achievement: 06/25/11 Potential to Achieve Goals: Fair Pt will go Supine/Side to Sit: with min assist PT Goal: Supine/Side to Sit - Progress: Progressing toward goal Pt will go Sit to Stand: with min  assist PT Goal: Sit to Stand - Progress: Progressing toward goal Pt will go Stand to Sit: with min assist PT Goal: Stand to Sit - Progress: Progressing toward goal Pt will Ambulate: 1 - 15 feet;with rolling walker;with +2 total assist PT Goal: Ambulate - Progress: Progressing toward goal  Visit Information  Last PT Received On: 06/19/11 Assistance Needed: +2    Subjective Data  Subjective: I hurt but I want to try to do a little bit Patient Stated Goal: I want to go for walks again   Cognition  Overall Cognitive Status: Appears within functional limits for tasks assessed/performed Arousal/Alertness: Awake/alert Orientation Level: Appears intact for tasks assessed Behavior During Session: [facility] for tasks performed    Balance     End of Session PT - End of Session Equipment Utilized During Treatment: Gait belt Activity Tolerance: Patient limited by fatigue;Patient limited by pain Patient left: in chair;with call bell/phone within reach Nurse Communication: Mobility status    Chastin Riesgo 06/19/2011, 2:33 PM

## 2011-06-19 NOTE — Progress Notes (Signed)
OT Screen Order received, chart reviewed. Pt is still requiring +2 A with PT and is planning for d/c to snf. Will sign off and defer OT eval to that venue.  Bernell List, OTR/L  Pager [phone] 06/19/2011

## 2011-06-20 DIAGNOSIS — Z7901 Long term (current) use of anticoagulants: Secondary | ICD-10-CM | POA: Diagnosis not present

## 2011-06-20 DIAGNOSIS — IMO0001 Reserved for inherently not codable concepts without codable children: Secondary | ICD-10-CM | POA: Diagnosis not present

## 2011-06-20 DIAGNOSIS — Z5189 Encounter for other specified aftercare: Secondary | ICD-10-CM | POA: Diagnosis not present

## 2011-06-20 DIAGNOSIS — S79919A Unspecified injury of unspecified hip, initial encounter: Secondary | ICD-10-CM | POA: Diagnosis not present

## 2011-06-20 DIAGNOSIS — M169 Osteoarthritis of hip, unspecified: Secondary | ICD-10-CM | POA: Diagnosis not present

## 2011-06-20 DIAGNOSIS — K2100 Gastro-esophageal reflux disease with esophagitis, without bleeding: Secondary | ICD-10-CM | POA: Diagnosis not present

## 2011-06-20 DIAGNOSIS — I739 Peripheral vascular disease, unspecified: Secondary | ICD-10-CM | POA: Diagnosis not present

## 2011-06-20 DIAGNOSIS — S79929A Unspecified injury of unspecified thigh, initial encounter: Secondary | ICD-10-CM | POA: Diagnosis not present

## 2011-06-20 DIAGNOSIS — M1990 Unspecified osteoarthritis, unspecified site: Secondary | ICD-10-CM | POA: Diagnosis not present

## 2011-06-20 DIAGNOSIS — E876 Hypokalemia: Secondary | ICD-10-CM | POA: Diagnosis not present

## 2011-06-20 DIAGNOSIS — D62 Acute posthemorrhagic anemia: Secondary | ICD-10-CM | POA: Diagnosis not present

## 2011-06-20 DIAGNOSIS — I2510 Atherosclerotic heart disease of native coronary artery without angina pectoris: Secondary | ICD-10-CM | POA: Diagnosis not present

## 2011-06-20 DIAGNOSIS — E785 Hyperlipidemia, unspecified: Secondary | ICD-10-CM | POA: Diagnosis not present

## 2011-06-20 DIAGNOSIS — I10 Essential (primary) hypertension: Secondary | ICD-10-CM | POA: Diagnosis not present

## 2011-06-20 DIAGNOSIS — M1610 Unilateral primary osteoarthritis, unspecified hip: Secondary | ICD-10-CM | POA: Diagnosis not present

## 2011-06-20 DIAGNOSIS — D649 Anemia, unspecified: Secondary | ICD-10-CM | POA: Diagnosis not present

## 2011-06-20 DIAGNOSIS — E781 Pure hyperglyceridemia: Secondary | ICD-10-CM | POA: Diagnosis not present

## 2011-06-20 DIAGNOSIS — Z96649 Presence of unspecified artificial hip joint: Secondary | ICD-10-CM | POA: Diagnosis not present

## 2011-06-20 DIAGNOSIS — K5900 Constipation, unspecified: Secondary | ICD-10-CM | POA: Diagnosis not present

## 2011-06-20 DIAGNOSIS — Z86711 Personal history of pulmonary embolism: Secondary | ICD-10-CM | POA: Diagnosis not present

## 2011-06-20 DIAGNOSIS — M25559 Pain in unspecified hip: Secondary | ICD-10-CM | POA: Diagnosis not present

## 2011-06-20 LAB — CBC
HCT: 25.7 % — ABNORMAL LOW (ref 36.0–46.0)
MCH: 27.9 pg (ref 26.0–34.0)
MCV: 88.6 fL (ref 78.0–100.0)
RDW: 15.8 % — ABNORMAL HIGH (ref 11.5–15.5)
WBC: 8.1 10*3/uL (ref 4.0–10.5)

## 2011-06-20 LAB — BASIC METABOLIC PANEL
BUN: 15 mg/dL (ref 6–23)
CO2: 27 mEq/L (ref 19–32)
Calcium: 7.9 mg/dL — ABNORMAL LOW (ref 8.4–10.5)
Glucose, Bld: 135 mg/dL — ABNORMAL HIGH (ref 70–99)
Sodium: 138 mEq/L (ref 135–145)

## 2011-06-20 MED ORDER — DSS 100 MG PO CAPS: 100.0000 mg | ORAL_CAPSULE | Freq: Two times a day (BID) | ORAL | Status: AC

## 2011-06-20 MED ORDER — BISACODYL 10 MG RE SUPP: 10.0000 mg | Freq: Every day | RECTAL | Status: AC | PRN

## 2011-06-20 MED ORDER — WARFARIN SODIUM 5 MG PO TABS: 5.0000 mg | ORAL_TABLET | Freq: Once | ORAL | Status: DC

## 2011-06-20 MED ORDER — HYDROCODONE-ACETAMINOPHEN 5-325 MG PO TABS: 1.0000 | ORAL_TABLET | ORAL | Status: AC | PRN

## 2011-06-20 MED ORDER — POLYSACCHARIDE IRON COMPLEX 150 MG PO CAPS: 150.0000 mg | ORAL_CAPSULE | Freq: Two times a day (BID) | ORAL | Status: DC

## 2011-06-20 MED ORDER — ONDANSETRON HCL 4 MG PO TABS: 4.0000 mg | ORAL_TABLET | Freq: Four times a day (QID) | ORAL | Status: AC | PRN

## 2011-06-20 MED ORDER — FENOFIBRATE 160 MG PO TABS
160.0000 mg | ORAL_TABLET | Freq: Every day | ORAL | Status: DC
  Filled 2011-06-20: qty 1

## 2011-06-20 MED ORDER — METHOCARBAMOL 500 MG PO TABS: 500.0000 mg | ORAL_TABLET | Freq: Four times a day (QID) | ORAL | Status: AC | PRN

## 2011-06-20 MED ORDER — WARFARIN SODIUM 5 MG PO TABS
5.0000 mg | ORAL_TABLET | Freq: Once | ORAL | Status: DC
  Filled 2011-06-20: qty 1

## 2011-06-20 MED ORDER — ENOXAPARIN SODIUM 40 MG/0.4ML ~~LOC~~ SOLN: 40.0000 mg | SUBCUTANEOUS | Status: DC

## 2011-06-20 MED ORDER — POLYETHYLENE GLYCOL 3350 17 G PO PACK: 17.0000 g | PACK | Freq: Every day | ORAL | Status: AC | PRN

## 2011-06-20 NOTE — Progress Notes (Signed)
Subjective: 3 Days Post-Op Procedure(s) (LRB): TOTAL HIP ARTHROPLASTY (Left) Patient reports pain as mild.   Patient seen in rounds with Dr. Wynelle Link. Husband in room.  HGB is still low but she was able to get up to the chair and walk a few steps without symptoms. Will transfer over to [facility] and continue to monitor symptoms. Patient is well, but has had some minor complaints of pain in the hip, requiring pain medications Patient is ready to go to [facility] today for continued therapy.  Objective: Vital signs in last 24 hours: Temp:  [98.5 F (36.9 C)-99.6 F (37.6 C)] 98.5 F (36.9 C) (05/09 0655) Pulse Rate:  [80-91] 80  (05/09 0655) Resp:  [16] 16  (05/09 0655) BP: (125-128)/(63-71) 128/71 mmHg (05/09 0655) SpO2:  [91 %-95 %] 95 % (05/09 0655)  Intake/Output from previous day:  Intake/Output Summary (Last 24 hours) at 06/20/11 0936 Last data filed at 06/20/11 0738  Gross per 24 hour  Intake   1560 ml  Output   1790 ml  Net   -230 ml    Intake/Output this shift: Total I/O In: 360 [P.O.:360] Out: -   Labs:  Basename 06/20/11 0428 06/19/11 0420 06/18/11 0405 06/17/11 1310  HGB 8.1* 8.7* 9.5* 11.9*    Basename 06/20/11 0428 06/19/11 0420  WBC 8.1 7.9  RBC 2.90* 3.04*  HCT 25.7* 26.7*  PLT 112* 113*    Basename 06/20/11 0428 06/19/11 0420  NA 138 139  K 3.6 3.5  CL 101 102  CO2 27 27  BUN 15 17  CREATININE 0.86 0.94  GLUCOSE 135* 118*  CALCIUM 7.9* 8.2*    Basename 06/20/11 0428 06/19/11 0420  LABPT -- --  INR 1.50* 1.31    EXAM: General - Patient is Alert, Appropriate and Oriented Extremity - Neurovascular intact Sensation intact distally Incision - clean, dry, no drainage, healing Motor Function - intact, moving foot and toes well on exam.   Assessment/Plan: 3 Days Post-Op Procedure(s) (LRB): TOTAL HIP ARTHROPLASTY (Left) Procedure(s) (LRB): TOTAL HIP ARTHROPLASTY (Left) Past Medical History  Diagnosis Date  . Hypertension   .  Hyperlipidemia   . Fibromyalgia   . Gout   . Tinnitus   . Obstructive sleep apnea on CPAP   . Acid reflux disease   . Pulmonary embolism     history of a postsurgical pulmonary embolism  . Vertigo   . Crohn disease   . Long term current use of anticoagulant   . Sleep apnea     DOES NOT KNOW SETTINGS - USES CPAP WHEN SLEEPING  . GERD (gastroesophageal reflux disease)   . Shortness of breath     WITH EXERTION--PT STATES LACK OF EXERCISE AND WEIGHT GAIN  . Recurrent upper respiratory infection (URI)     BRONCHITIS FEB 2013--SLIGHT COUGH NON-PRODUCTIVE NOW  . H/O hiatal hernia   . Peripheral vascular disease     KNOWN RIGHT INTERNAL CAROTID ARTERY OCCLUSION (NO STROKE)  --40 TO 59% STENOSIS LEFT ICA-FOLLOWED BY DR. EARLY WITH DOPPLER STUDY EVERY 6 MONTHS  . PVC (premature ventricular contraction)     PT STATES HX OF PVC'S ON EKG  . PONV (postoperative nausea and vomiting)   . Osteoarthritis     PAIN AND OA LEF T HIP AND BOTH SHOULDERS ARE BONE ON BONE AND PAINFUL   Principal Problem:  *OA (osteoarthritis) of hip Active Problems:  Postop Acute blood loss anemia   Up with therapy Discharge to SNF Diet - Cardiac diet Follow up -  in 2 weeks Activity - PWB Disposition - Skilled nursing facility Condition Upon Discharge - Fair D/C Meds - See DC Summary DVT Prophylaxis - Lovenox and Coumadin  [name] 06/20/2011, 9:36 AM

## 2011-06-20 NOTE — Discharge Summary (Signed)
Physician Discharge Summary   Patient ID: [name] MRN: [id number] DOB/AGE: Jun 16, 1934 77 y.o.  Admit date: 06/17/2011 Discharge date: 06/20/2011  Primary Diagnosis: Osteoarthritis: Left Hip   Admission Diagnoses:  Past Medical History  Diagnosis Date  . Hypertension   . Hyperlipidemia   . Fibromyalgia   . Gout   . Tinnitus   . Obstructive sleep apnea on CPAP   . Acid reflux disease   . Pulmonary embolism     history of a postsurgical pulmonary embolism  . Vertigo   . Crohn disease   . Long term current use of anticoagulant   . Sleep apnea     DOES NOT KNOW SETTINGS - USES CPAP WHEN SLEEPING  . GERD (gastroesophageal reflux disease)   . Shortness of breath     WITH EXERTION--PT STATES LACK OF EXERCISE AND WEIGHT GAIN  . Recurrent upper respiratory infection (URI)     BRONCHITIS FEB 2013--SLIGHT COUGH NON-PRODUCTIVE NOW  . H/O hiatal hernia   . Peripheral vascular disease     KNOWN RIGHT INTERNAL CAROTID ARTERY OCCLUSION (NO STROKE)  --40 TO 59% STENOSIS LEFT ICA-FOLLOWED BY DR. EARLY WITH DOPPLER STUDY EVERY 6 MONTHS  . PVC (premature ventricular contraction)     PT STATES HX OF PVC'S ON EKG  . PONV (postoperative nausea and vomiting)   . Osteoarthritis     PAIN AND OA LEF T HIP AND BOTH SHOULDERS ARE BONE ON BONE AND PAINFUL   Discharge Diagnoses:   Principal Problem:  *OA (osteoarthritis) of hip Active Problems:  Postop Acute blood loss anemia  Procedure: Procedure(s) (LRB): TOTAL HIP ARTHROPLASTY (Left)   Consults: None  HPI: [name] is a 77 y.o. female with end stage arthritis of her left hip with progressively worsening pain and dysfunction. Pain occurs with activity and rest including pain at night. She has tried analgesics, protected weight bearing and rest without benefit. Pain is too severe to attempt physical therapy. Radiographs demonstrate bone on bone arthritis with subchondral cyst formation. She presents now for left THA.  Laboratory  Data: Anti-coag visit on 05/28/2011  Component Date Value Range Status  . INR  05/28/2011 3.8   Final    Basename 06/20/11 0428 06/19/11 0420 06/18/11 0405 06/17/11 1310  HGB 8.1* 8.7* 9.5* 11.9*    Basename 06/20/11 0428 06/19/11 0420  WBC 8.1 7.9  RBC 2.90* 3.04*  HCT 25.7* 26.7*  PLT 112* 113*    Basename 06/20/11 0428 06/19/11 0420  NA 138 139  K 3.6 3.5  CL 101 102  CO2 27 27  BUN 15 17  CREATININE 0.86 0.94  GLUCOSE 135* 118*  CALCIUM 7.9* 8.2*    Basename 06/20/11 0428 06/19/11 0420  LABPT -- --  INR 1.50* 1.31    X-Rays:Dg Pelvis Portable  06/17/2011  *RADIOLOGY REPORT*  Clinical Data: Postoperative for left total hip replacement.  PORTABLE PELVIS  Comparison: 02/21/2009  Findings: A new left total hip prosthesis is present, with expected positioning and alignment, and without fracture or complicating feature observed.  A right hip prosthesis is in place with multiple cerclage is noted.  IMPRESSION:  1.  Left total hip prosthesis noted without fracture or complicating feature.  Original Report Authenticated By: Carron Curie, M.D.   Dg Hip Portable 1 View Left  06/17/2011  *RADIOLOGY REPORT*  Clinical Data: Left total hip prosthesis placement.  PORTABLE LEFT HIP - 1 VIEW  Comparison: 05/06/2011  Findings: Frontal view of the left hip include the  entire stem of the left prosthesis demonstrates no fracture or complicating feature. Expected postoperative gas is noted in the soft tissues and a drain is in place.  IMPRESSION:  1.  Left total hip prosthesis noted without fracture or complicating feature.  Original Report Authenticated By: Carron Curie, M.D.    EKG: Orders placed in visit on 05/07/11  . EKG 12-LEAD     Hospital Course: Patient was admitted to [facility] and taken to the OR and underwent the above state procedure without complications.  Patient tolerated the procedure well and was later transferred to the recovery room and then to  the orthopaedic floor for postoperative care.  They were given PO and IV analgesics for pain control following their surgery.  They were given 24 hours of postoperative antibiotics and started on DVT prophylaxis in the form of Lovenox and Coumadin.   PT and OT were ordered for total hip protocol.  The patient was allowed to be PWB with therapy. Discharge planning was consulted to help with postop disposition and equipment needs. Patient wanted to look into [facility] for therapy. Patient had a tough night on the evening of surgery with some discomfort and started to get up OOB with therapy on day one but had some difficulty with transfers and mobility.  Hemovac drain was pulled without difficulty.  The knee immobilizer was removed and discontinued.  Continued to work with therapy into day two but still very slow with transfers and ambulation but did get up and take a few steps in the room.  Dressing was changed on day two and the incision was healing well.  By day three, the patient was starting to progress with therapy.  Incision was healing well.  Patient was seen in rounds and was ready to go to [facility] for continued therapy.  Discharge Medications: Prior to Admission medications   Medication Sig Start Date End Date Taking? Authorizing Provider  acetaminophen (TYLENOL) 500 MG tablet Take 1,000 mg by mouth every 6 (six) hours as needed.   Yes Historical Provider, MD  allopurinol (ZYLOPRIM) 300 MG tablet Take 1 tablet by mouth Daily. 05/10/10  Yes Historical Provider, MD  amLODipine (NORVASC) 5 MG tablet Take 1 tablet by mouth Daily. 06/08/10  Yes Historical Provider, MD  Brimonidine Tartrate-Timolol (COMBIGAN OP) Apply 1 drop to eye 2 (two) times daily. RIGHT EYE   Yes Historical Provider, MD  diphenhydrAMINE (BENADRYL) 25 mg capsule Take 25 mg by mouth at bedtime. For sleep   Yes Historical Provider, MD  fenofibrate 160 MG tablet 1 tablet. EVENING 05/10/10  Yes Historical Provider, MD  indapamide  (LOZOL) 2.5 MG tablet Take 2.5 mg by mouth Daily. 10/06/10  Yes Historical Provider, MD  Loratadine (CLARITIN PO) Take 1 tablet by mouth as needed.    Yes Historical Provider, MD  Magnesium 400 MG CAPS Take 1 capsule by mouth daily.  02/19/11  Yes Historical Provider, MD  metoprolol tartrate (LOPRESSOR) 25 MG tablet Take 1 tablet by mouth Twice daily. 05/10/10  Yes Historical Provider, MD  omeprazole (PRILOSEC) 40 MG capsule Take 1 capsule by mouth Daily. 06/08/10  Yes Historical Provider, MD  bisacodyl (DULCOLAX) 10 MG suppository Place 1 suppository (10 mg total) rectally daily as needed. 06/20/11 06/30/11  Denetra Formoso, PA  docusate sodium 100 MG CAPS Take 100 mg by mouth 2 (two) times daily. 06/20/11 06/30/11  Nathanael Krist, PA  enoxaparin (LOVENOX) 40 MG/0.4ML injection Inject 0.4 mLs (40 mg total) into the skin  daily. Continue Lovenox injections until the INR is therapeutic at or greater than 2.0.  When INR reaches the therapeutic level, may discontinue the Lovenox injections.  Will daily INR checks until she reaches therapeutic range. 06/20/11   Lain Tetterton Dara Lords, PA  furosemide (LASIX) 40 MG tablet 1/2 TAB Every other day prn for fluid 06/11/10 06/11/11  Darlin Coco, MD  HYDROcodone-acetaminophen (NORCO) 5-325 MG per tablet Take 1-2 tablets by mouth every 4 (four) hours as needed. 06/20/11 06/30/11  Brette Cast, PA  iron polysaccharides (NIFEREX) 150 MG capsule Take 1 capsule (150 mg total) by mouth 2 (two) times daily. 06/20/11 06/19/12  Keauna Brasel, PA  methocarbamol (ROBAXIN) 500 MG tablet Take 1 tablet (500 mg total) by mouth every 6 (six) hours as needed. 06/20/11 06/30/11  Haroldine Redler, PA  ondansetron (ZOFRAN) 4 MG tablet Take 1 tablet (4 mg total) by mouth every 6 (six) hours as needed for nausea. 06/20/11 06/27/11  Jensen Kilburg, PA  polyethylene glycol (MIRALAX / GLYCOLAX) packet Take 17 g by mouth daily as needed. 06/20/11 06/23/11  Shatera Rennert, PA  warfarin  (COUMADIN) 5 MG tablet Take 1 tablet (5 mg total) by mouth one time only at 6 PM. Take Coumadin for three weeks and then resume the normal home regimen of Coumadin.  The dose may need to be adjusted based upon the INR.  Please follow the INR and titrate Coumadin dose for a therapeutic range between 2.0 and 3.0 INR.  After completing the three weeks of Coumadin, the patient may resume the normal home regimen of Coumadin. 06/20/11 06/19/12  Octavian Godek, PA   Please note that the patient may resume her Celebrex 200 mg daily once the Lovenox has been discontinued.   Diet: heart healthy Activity:PWB 25-50% No bending hip over 90 degrees- A "L" Angle Do not cross legs Do not let foot roll inward When turning these patients a pillow should be placed between the patient's legs to prevent crossing. Patients should have the affected knee fully extended when trying to sit or stand from all surfaces to prevent excessive hip flexion. When ambulating and turning toward the affected side the affected leg should have the toes turned out prior to moving the walker and the rest of patient's body as to prevent internal rotation/ turning in of the leg. Abduction pillows are the most effective way to prevent a patient from not crossing legs or turning toes in at rest. If an abduction pillow is not ordered placing a regular pillow length wise between the patient's legs is also an effective reminder. It is imperative that these precautions be maintained so that the surgical hip does not dislocate. Follow-up:in 2 weeks Disposition - [facility] Discharged Condition: fair   Discharge Orders    Future Appointments: Provider: Department: Dept Phone: Center:   07/22/2011 10:30 AM [facility] [phone] [facility]   07/30/2011 3:00 PM [facility] [phone] [facility]   07/30/2011 3:30 PM Rosetta Posner, MD [facility] [place] [facility] [phone] [facility]     Future Orders  Please Complete By Expires   Diet - low sodium heart healthy      Diet Carb Modified      Call MD / Call 911      Comments:   If you experience chest pain or shortness of breath, CALL 911 and be transported to the hospital emergency room.  If you develope a fever above 101 F, pus (white drainage) or increased drainage or  redness at the wound, or calf pain, call your surgeon's office.   Discharge instructions      Comments:     Pick up stool softner and laxative for home. Do not submerge incision under water. May shower. Continue to use ice for pain and swelling from surgery. Hip precautions.  Total Hip Protocol.  Take Coumadin for three weeks and then the patient may resume the normal home regimen of Coumadin.  The dose may need to be adjusted based upon the INR.  Please follow the INR and titrate Coumadin dose for a therapeutic range between 2.0 and 3.0 INR.  After completing the three weeks of Coumadin, the patient may resume the normal home regimen of Coumadin.  Please note that the patient may resume her Celebrex 200 mg daily once the INR is therapeutic and the Lovenox has been discontinued.      Continue Lovenox injections until the INR is therapeutic at or greater than 2.0. When INR reaches the              therapeutic level, may discontinue the Lovenox injections.  When discharged from the skilled rehab facility, please have the facility set up the patient's [place] prior to being released.  Also provide the patient with their medications at time of release from the facility to include their pain medication, the muscle relaxants, and their blood thinner medication.  If the patient is still at the rehab facility at time of follow up appointment, please also assist the patient in arranging follow up appointment in our office and any transportation needs.    Constipation Prevention      Comments:   Drink plenty of fluids.  Prune juice may be helpful.  You may use a  stool softener, such as Colace (over the counter) 100 mg twice a day.  Use MiraLax (over the counter) for constipation as needed.   Increase activity slowly as tolerated      Weight Bearing as taught in Physical Therapy      Comments:   Use a walker or crutches as instructed.   Patient may shower      Comments:   You may shower without a dressing once there is no drainage.  Do not wash over the wound.  If drainage remains, do not shower until drainage stops.   Driving restrictions      Comments:   No driving until released by the physician.   Lifting restrictions      Comments:   No lifting until released by the physician.   Follow the hip precautions as taught in Physical Therapy      Change dressing      Comments:   You may change your dressing dressing daily with sterile 4 x 4 inch gauze dressing and paper tape.  Do not submerge the incision under water.   TED hose      Comments:   Use stockings (TED hose) for 3 weeks on both leg(s).  You may remove them at night for sleeping.   Do not sit on low chairs, stoools or toilet seats, as it may be difficult to get up from low surfaces        Medication List  As of 06/20/2011 10:11 AM   STOP taking these medications         CALTRATE 600+D PO      CELEBREX 200 MG capsule      HYDROcodone-acetaminophen 5-500 MG per tablet  TAKE these medications         acetaminophen 500 MG tablet   Commonly known as: TYLENOL   Take 1,000 mg by mouth every 6 (six) hours as needed.      allopurinol 300 MG tablet   Commonly known as: ZYLOPRIM   Take 1 tablet by mouth Daily.      amLODipine 5 MG tablet   Commonly known as: NORVASC   Take 1 tablet by mouth Daily.      bisacodyl 10 MG suppository   Commonly known as: DULCOLAX   Place 1 suppository (10 mg total) rectally daily as needed.      CLARITIN PO   Take 1 tablet by mouth as needed.      COMBIGAN OP   Apply 1 drop to eye 2 (two) times daily. RIGHT EYE      diphenhydrAMINE 25  mg capsule   Commonly known as: BENADRYL   Take 25 mg by mouth at bedtime. For sleep      DSS 100 MG Caps   Take 100 mg by mouth 2 (two) times daily.      enoxaparin 40 MG/0.4ML injection   Commonly known as: LOVENOX   Inject 0.4 mLs (40 mg total) into the skin daily. Continue Lovenox injections until the INR is therapeutic at or greater than 2.0.  When INR reaches the therapeutic level, may discontinue the Lovenox injections.  Will daily INR checks until she reaches therapeutic range.      fenofibrate 160 MG tablet   1 tablet. EVENING      furosemide 40 MG tablet   Commonly known as: LASIX   1/2 TAB Every other day prn for fluid      HYDROcodone-acetaminophen 5-325 MG per tablet   Commonly known as: NORCO   Take 1-2 tablets by mouth every 4 (four) hours as needed.      indapamide 2.5 MG tablet   Commonly known as: LOZOL   Take 2.5 mg by mouth Daily.      iron polysaccharides 150 MG capsule   Commonly known as: NIFEREX   Take 1 capsule (150 mg total) by mouth 2 (two) times daily.      Magnesium 400 MG Caps   Take 1 capsule by mouth daily.      methocarbamol 500 MG tablet   Commonly known as: ROBAXIN   Take 1 tablet (500 mg total) by mouth every 6 (six) hours as needed.      metoprolol tartrate 25 MG tablet   Commonly known as: LOPRESSOR   Take 1 tablet by mouth Twice daily.      omeprazole 40 MG capsule   Commonly known as: PRILOSEC   Take 1 capsule by mouth Daily.      ondansetron 4 MG tablet   Commonly known as: ZOFRAN   Take 1 tablet (4 mg total) by mouth every 6 (six) hours as needed for nausea.      polyethylene glycol packet   Commonly known as: MIRALAX / GLYCOLAX   Take 17 g by mouth daily as needed.      warfarin 5 MG tablet   Commonly known as: COUMADIN   Take 1 tablet (5 mg total) by mouth one time only at 6 PM. Take Coumadin for three weeks and then resume the normal home regimen of Coumadin.  The dose may need to be adjusted based upon the INR.   Please follow the INR and titrate Coumadin dose for a therapeutic range between  2.0 and 3.0 INR.  After completing the three weeks of Coumadin, the patient may resume the normal home regimen of Coumadin.           Follow-up Information    Follow up with Gearlean Alf, MD. Schedule an appointment as soon as possible for a visit in 2 weeks.   Contact information:   [facility] [address] [place] [phone]          Signed: Mickel Crow 06/20/2011, 10:11 AM

## 2011-06-20 NOTE — Progress Notes (Signed)
Physical Therapy Treatment Patient Details Name: KEVIONNA HEFFLER MRN: [id number] DOB: May 30, 1934 Today's Date: 06/20/2011 Time: 8088-1103 PT Time Calculation (min): 11 min  PT Assessment / Plan / Recommendation Comments on Treatment Session  No new complaints    Follow Up Recommendations  Skilled nursing facility    Barriers to Discharge        Equipment Recommendations  Defer to next venue    Recommendations for Other Services OT consult  Frequency 7X/week   Plan Discharge plan remains appropriate    Precautions / Restrictions Precautions Precautions: Posterior Hip Precaution Comments: pt recalls all THP without cues Restrictions Weight Bearing Restrictions: Yes LLE Weight Bearing: Partial weight bearing LLE Partial Weight Bearing Percentage or Pounds: 25-50%   Pertinent Vitals/Pain     Mobility  Bed Mobility Bed Mobility: Sit to Supine Supine to Sit: 1: +2 Total assist Supine to Sit: Patient Percentage: 70% Sit to Supine: 1: +2 Total assist Sit to Supine: Patient Percentage: 50% Details for Bed Mobility Assistance: cues for sequence and for use of UEs to self assist - physical assist for L LE and to bring upper body erect Transfers Transfers: Sit to Stand;Stand to Sit Sit to Stand: 1: +2 Total assist Sit to Stand: Patient Percentage: 70% Stand to Sit: 1: +2 Total assist Stand to Sit: Patient Percentage: 60% Details for Transfer Assistance: cues for LE management and for use of UEs to self assist Ambulation/Gait Ambulation/Gait Assistance: 1: +2 Total assist Ambulation/Gait: Patient Percentage: 70% Ambulation Distance (Feet): 5 Feet Assistive device: Rolling walker Ambulation/Gait Assistance Details: cues for sequence, posture and position from RW Gait Pattern: Step-to pattern Gait velocity: slow    Exercises Total Joint Exercises Ankle Circles/Pumps: 20 reps;AROM;Supine;Both Quad Sets: 15 reps;AAROM;Supine;Both Heel Slides: AAROM;15 reps;Supine;Left Hip  ABduction/ADduction: AAROM;15 reps;Supine;Left   PT Diagnosis:    PT Problem List:   PT Treatment Interventions:     PT Goals Acute Rehab PT Goals PT Goal Formulation: With patient Time For Goal Achievement: 06/25/11 Potential to Achieve Goals: Fair Pt will go Supine/Side to Sit: with min assist PT Goal: Supine/Side to Sit - Progress: Progressing toward goal Pt will go Sit to Supine/Side: with min assist PT Goal: Sit to Supine/Side - Progress: Progressing toward goal Pt will go Sit to Stand: with min assist PT Goal: Sit to Stand - Progress: Progressing toward goal Pt will go Stand to Sit: with min assist PT Goal: Stand to Sit - Progress: Progressing toward goal Pt will Ambulate: 1 - 15 feet;with rolling walker;with +2 total assist PT Goal: Ambulate - Progress: Progressing toward goal  Visit Information  Last PT Received On: 06/20/11 Assistance Needed: +2    Subjective Data  Subjective: I'll do what I can Patient Stated Goal: I want to go for walks again   Cognition  Overall Cognitive Status: Appears within functional limits for tasks assessed/performed Arousal/Alertness: Awake/alert Orientation Level: Appears intact for tasks assessed Behavior During Session: [facility] for tasks performed    Balance     End of Session PT - End of Session Equipment Utilized During Treatment: Gait belt Activity Tolerance: Patient limited by fatigue;Patient limited by pain Patient left: Other (comment) (on ambulance gurney for transport to SNF) Nurse Communication: Mobility status    Odean Mcelwain 06/20/2011, 3:13 PM

## 2011-06-20 NOTE — Progress Notes (Signed)
ANTICOAGULATION CONSULT NOTE - Follow-Up  Pharmacy Consult for Warfarin Indication: hx of PE, VTE prophylaxis s/p THA  Allergies  Allergen Reactions  . Ciprofloxacin     RASH  . Codeine     NAUSEA  . Cymbalta (Duloxetine Hcl)     Nausea VOMITING AND ABDOMINAL PAIN, HEADACHE, JUST ABOUT EVERY SIDE EFFECT THE DRUG HAS  . Penicillins     RASH  & ITCHING   --PT STATES SHE CAN TAKE KEFLEX PO AND IV CEPHALOSPORINS  . Tetanus Toxoids     SWELLING, REDNESS  WHOLE ARM    Patient Measurements: Height: 5' 2"  (157.5 cm) Weight: 232 lb (105.235 kg) IBW/kg (Calculated) : 50.1   Vital Signs: Temp: 98.5 F (36.9 C) (05/09 0655) Temp src: Oral (05/09 0655) BP: 128/71 mmHg (05/09 0655) Pulse Rate: 80  (05/09 0655)  Labs:  Basename 06/20/11 0428 06/19/11 0420 06/18/11 0405 06/17/11 1310  HGB 8.1* 8.7* -- --  HCT 25.7* 26.7* 28.9* --  PLT 112* 113* 133* --  APTT -- -- -- 36  LABPROT 18.4* 16.5* 15.8* --  INR 1.50* 1.31 1.23 --  HEPARINUNFRC -- -- -- --  CREATININE 0.86 0.94 0.77 --  CKTOTAL -- -- -- --  CKMB -- -- -- --  TROPONINI -- -- -- --   Estimated Creatinine Clearance: 65.3 ml/min (by C-G formula based on Cr of 0.86).  Medications:  Scheduled:     . allopurinol  300 mg Oral Daily  . amLODipine  5 mg Oral Daily  . brimonidine  1 drop Right Eye Q12H   And  . timolol  1 drop Right Eye Q12H  . docusate sodium  100 mg Oral BID  . enoxaparin  40 mg Subcutaneous Q24H  . fenofibrate  160 mg Oral Daily  . indapamide  2.5 mg Oral Daily  . iron polysaccharides  150 mg Oral BID  . loratadine  10 mg Oral Daily  . metoprolol tartrate  25 mg Oral BID  . omeprazole  40 mg Oral Q1200  . warfarin  4 mg Oral ONCE-1800  . Warfarin - Pharmacist Dosing Inpatient   Does not apply q1800  . DISCONTD: iron polysaccharides  150 mg Oral Daily   Inpatient warfarin doses: 27m on 5/6.   Assessment:  77yo F on chronic anticoagulation for  hx of PE and now VTE prophylaxis s/p THA  PTA  Warfarin 427mon Tues, Thurs, Sat and 63m21mther days. Last Dose: 06/11/2011; Lovenox 100m76mD bridge 05/07/2011 - 06/16/2011 0800  INR slow to respond (1.5) after 4mg 84m doses  Hg low (8.1), no bleeding reported    Goal of Therapy:  INR 2-3   Plan:   Increase warfarin slightly to 5mg P19monight x 1  Continue Lovenox 40mg d78m, as ordered per MD - d/c when INR >1.8  If discharged today, recommend resuming home dose tomorrow (after boosted dose today) and checking daily INR at SNF until stable.   Latisia Hilaire WiPeggyann JubaD, BCPS Pager: [phone]  8:51 AM

## 2011-06-20 NOTE — Discharge Instructions (Signed)
Dr. Gaynelle Arabian Total Joint Specialist [facility] [address] [place], [place] [address] [phone]   TOTAL HIP REPLACEMENT POSTOPERATIVE DIRECTIONS    Hip Rehabilitation, Guidelines Following Surgery  The results of a hip operation are greatly improved after range of motion and muscle strengthening exercises. Follow all safety measures which are given to protect your hip. If any of these exercises cause increased pain or swelling in your joint, decrease the amount until you are comfortable again. Then slowly increase the exercises. Call your caregiver if you have problems or questions.  HOME CARE INSTRUCTIONS  Most of the following instructions are designed to prevent the dislocation of your new hip.  Do not put on socks or shoes without following the instructions of your caregivers.  Sit on high chairs so your hips are not bent more than 90 degrees.  Sit on chairs with arms. Use the chair arms to help push yourself up when arising.  Keep your leg on the side of the operation out in front of you when standing up.  Arrange for the use of a toilet seat elevator so you are not sitting low.  Do not do any exercises or get in any positions that cause your toes to point in (pigeon toed).  Always sleep with a pillow between your legs. Do not lie on your side in sleep with both knees touching the bed.  You may resume a sexual relationship in one month or when given the OK by your caregiver.  Use walker as long as suggested by your caregivers.  Begin partial weight bearing after surgery but do not advance weight bearing status until approved by your surgeon.  Avoid periods of inactivity such as sitting longer than an hour when not asleep. This helps prevent blood clots.  You may return to work once you are cleared by your caregiver.  Do not drive a car for 6 weeks or as instructed.  Do not drive while taking narcotics.  Wear elastic stockings until instructed  not to.  Make sure you keep all of your appointments after your operation with all of your doctors and caregivers.  RANGE OF MOTION AND STRENGTHENING EXERCISES  These exercises are designed to help you keep full movement of your hip joint. Follow your caregiver's or physical therapist's instructions. Perform all exercises about fifteen times, three times per day or as directed. Exercise both hips, even if you have had only one joint replacement. These exercises can be done on a training (exercise) mat, on the floor, on a table or on a bed. Use whatever works the best and is most comfortable for you. Use music or television while you are exercising so that the exercises are a pleasant break in your day. This will make your life better with the exercises acting as a break in routine you can look forward to.  Lying on your back, slowly slide your foot toward your buttocks, raising your knee up off the floor. Then slowly slide your foot back down until your leg is straight again.  Lying on your back spread your legs as far apart as you can without causing discomfort.  Lying on your side, raise your upper leg and foot straight up from the floor as far as is comfortable. Slowly lower the leg and repeat.  Lying on your back, tighten up the muscle in the front of your thigh (quadriceps muscles). You can do this by keeping your leg straight and trying to raise your heel  off the floor. This helps strengthen the largest muscle supporting your knee.  Lying on your back, tighten up the muscles of your buttocks both with the legs straight and with the knee bent at a comfortable angle while keeping your heel on the floor.  Document Released: 09/01/2003 Document Revised: 01/17/2011 Document Reviewed: 08/19/2007  [facility] Patient Information 2012 [place].  When discharged from the skilled rehab facility, please have the facility set up the patient's [place] prior to being released.  Also  provide the patient with their medications at time of release from the facility to include their pain medication, the muscle relaxants, and their blood thinner medication.  If the patient is still at the rehab facility at time of follow up appointment, please also assist the patient in arranging follow up appointment in our office and any transportation needs.  Take Coumadin for three weeks and then the patient may resume her normal home regimen of Coumadin .  The dose may need to be adjusted based upon the INR.  Please follow the INR and titrate Coumadin dose for a therapeutic range between 2.0 and 3.0 INR.  After completing the three weeks of Coumadin, the patient may resume the normal home regimen of Coumadin.  Continue Lovenox injections until the INR is therapeutic at or greater than 2.0.  When INR reaches the therapeutic level, may discontinue the Lovenox injections.  Please note that the patient may resume her Celebrex 200 mg daily once the Lovenox has been discontinued.

## 2011-06-20 NOTE — Progress Notes (Signed)
Physical Therapy Treatment Patient Details Name: [name] MRN: [id number] DOB: Aug 24, 1934 Today's Date: 06/20/2011 Time: 3009-2330 PT Time Calculation (min): 27 min  PT Assessment / Plan / Recommendation Comments on Treatment Session  No new complaints    Follow Up Recommendations  Skilled nursing facility    Barriers to Discharge        Equipment Recommendations  Defer to next venue    Recommendations for Other Services OT consult  Frequency 7X/week   Plan Discharge plan remains appropriate    Precautions / Restrictions Precautions Precautions: Posterior Hip Precaution Comments: pt recalls all THP without cues Restrictions LLE Weight Bearing: Partial weight bearing LLE Partial Weight Bearing Percentage or Pounds: 25-50%   Pertinent Vitals/Pain     Mobility  Bed Mobility Bed Mobility: Sit to Supine Supine to Sit: 1: +2 Total assist Supine to Sit: Patient Percentage: 70% Details for Bed Mobility Assistance: cues for sequence and for use of UEs to self assist - physical assist for L LE and to bring upper body erect Transfers Transfers: Sit to Stand;Stand to Sit Sit to Stand: 1: +2 Total assist Sit to Stand: Patient Percentage: 70% Stand to Sit: 1: +2 Total assist Stand to Sit: Patient Percentage: 60% Details for Transfer Assistance: cues for LE management and for use of UEs to self assist Ambulation/Gait Ambulation/Gait Assistance: 1: +2 Total assist Ambulation/Gait: Patient Percentage: 70% Ambulation Distance (Feet): 7 Feet Assistive device: Rolling walker Ambulation/Gait Assistance Details: cues for posture, sequence, position from RW and increased WB on UEs Gait Pattern: Step-to pattern Gait velocity: slow    Exercises Total Joint Exercises Ankle Circles/Pumps: 20 reps;AROM;Supine;Both Quad Sets: 15 reps;AAROM;Supine;Both Heel Slides: AAROM;15 reps;Supine;Left Hip ABduction/ADduction: AAROM;15 reps;Supine;Left   PT Diagnosis:    PT Problem List:   PT  Treatment Interventions:     PT Goals Acute Rehab PT Goals PT Goal Formulation: With patient Time For Goal Achievement: 06/25/11 Potential to Achieve Goals: Fair Pt will go Supine/Side to Sit: with min assist PT Goal: Supine/Side to Sit - Progress: Progressing toward goal Pt will go Sit to Stand: with min assist PT Goal: Sit to Stand - Progress: Progressing toward goal Pt will go Stand to Sit: with min assist PT Goal: Stand to Sit - Progress: Progressing toward goal Pt will Ambulate: 1 - 15 feet;with rolling walker;with +2 total assist PT Goal: Ambulate - Progress: Progressing toward goal  Visit Information  Last PT Received On: 06/20/11 Assistance Needed: +2    Subjective Data  Subjective: I'll do what I can Patient Stated Goal: I want to go for walks again   Cognition  Overall Cognitive Status: Appears within functional limits for tasks assessed/performed Arousal/Alertness: Awake/alert Orientation Level: Appears intact for tasks assessed Behavior During Session: [facility] for tasks performed    Balance     End of Session PT - End of Session Equipment Utilized During Treatment: Gait belt Activity Tolerance: Patient limited by fatigue;Patient limited by pain Patient left: in chair;with call bell/phone within reach;with family/visitor present Nurse Communication: Mobility status    Melaney Tellefsen 06/20/2011, 3:08 PM

## 2011-06-20 NOTE — Progress Notes (Signed)
Clinical Social Work Department CLINICAL SOCIAL WORK PLACEMENT NOTE 06/20/2011  Patient:  Bonnie, Stanley  Account Number:  [id number] Admit date:  06/17/2011  Clinical Social Worker:  Werner Lean, LCSW  Date/time:  06/18/2011 01:37 PM  Clinical Social Work is seeking post-discharge placement for this patient at the following level of care:   SKILLED NURSING   (*CSW will update this form in Epic as items are completed)     Patient/family provided with [place] Department of Clinical Social Work's list of facilities offering this level of care within the geographic area requested by the patient (or if unable, by the patient's family).    Patient/family informed of their freedom to choose among providers that offer the needed level of care, that participate in Medicare, Medicaid or managed care program needed by the patient, have an available bed and are willing to accept the patient.    Patient/family informed of [facility]' ownership interest in [facility], as well as of the fact that they are under no obligation to receive care at this facility.  PASARR submitted to [employer] on  PASARR number received from [employer] on 02/20/2010  FL2 transmitted to all facilities in geographic area requested by pt/family on  06/18/2011 FL2 transmitted to all facilities within larger geographic area on   Patient informed that his/her managed care company has contracts with or will negotiate with  certain facilities, including the following:     Patient/family informed of bed offers received:  06/18/2011 Patient chooses bed at [facility] Physician recommends and patient chooses bed at    Patient to be transferred to [facility] on  06/20/2011 Patient to be transferred to facility by [facility]  The following physician request were entered in Epic:   Additional Comments: Pt has made prior arrangements to have [place] rehab at [facility].  Werner Lean  LCSW  [phone]

## 2011-06-20 NOTE — Plan of Care (Signed)
Problem: Consults Goal: Diagnosis- Total Joint Replacement Primary Total Hip LEFT

## 2011-06-25 DIAGNOSIS — Z7901 Long term (current) use of anticoagulants: Secondary | ICD-10-CM | POA: Diagnosis not present

## 2011-06-25 DIAGNOSIS — D649 Anemia, unspecified: Secondary | ICD-10-CM | POA: Diagnosis not present

## 2011-06-25 DIAGNOSIS — M1610 Unilateral primary osteoarthritis, unspecified hip: Secondary | ICD-10-CM | POA: Diagnosis not present

## 2011-06-25 DIAGNOSIS — I10 Essential (primary) hypertension: Secondary | ICD-10-CM | POA: Diagnosis not present

## 2011-06-25 DIAGNOSIS — E785 Hyperlipidemia, unspecified: Secondary | ICD-10-CM | POA: Diagnosis not present

## 2011-06-25 DIAGNOSIS — E876 Hypokalemia: Secondary | ICD-10-CM | POA: Diagnosis not present

## 2011-06-26 DIAGNOSIS — I10 Essential (primary) hypertension: Secondary | ICD-10-CM | POA: Diagnosis not present

## 2011-06-26 DIAGNOSIS — E876 Hypokalemia: Secondary | ICD-10-CM | POA: Diagnosis not present

## 2011-06-26 DIAGNOSIS — M1610 Unilateral primary osteoarthritis, unspecified hip: Secondary | ICD-10-CM | POA: Diagnosis not present

## 2011-06-26 DIAGNOSIS — K5900 Constipation, unspecified: Secondary | ICD-10-CM | POA: Diagnosis not present

## 2011-06-26 DIAGNOSIS — D62 Acute posthemorrhagic anemia: Secondary | ICD-10-CM | POA: Diagnosis not present

## 2011-06-28 DIAGNOSIS — M1610 Unilateral primary osteoarthritis, unspecified hip: Secondary | ICD-10-CM | POA: Diagnosis not present

## 2011-06-28 DIAGNOSIS — Z7901 Long term (current) use of anticoagulants: Secondary | ICD-10-CM | POA: Diagnosis not present

## 2011-06-28 DIAGNOSIS — Z86711 Personal history of pulmonary embolism: Secondary | ICD-10-CM | POA: Diagnosis not present

## 2011-07-02 DIAGNOSIS — Z7901 Long term (current) use of anticoagulants: Secondary | ICD-10-CM | POA: Diagnosis not present

## 2011-07-02 DIAGNOSIS — M1610 Unilateral primary osteoarthritis, unspecified hip: Secondary | ICD-10-CM | POA: Diagnosis not present

## 2011-07-02 DIAGNOSIS — Z86711 Personal history of pulmonary embolism: Secondary | ICD-10-CM | POA: Diagnosis not present

## 2011-07-04 ENCOUNTER — Telehealth: Payer: Self-pay | Admitting: Vascular Surgery

## 2011-07-04 NOTE — Telephone Encounter (Signed)
Patient was rescheduled to 08/14/11 by Horris Latino, pt has been notified, dpm

## 2011-07-04 NOTE — Telephone Encounter (Signed)
Message copied by Gena Fray on Thu Jul 04, 2011  9:27 AM ------      Message from: Merleen Nicely      Created: Thu Jul 04, 2011  9:18 AM       Charlette Caffey wants to cancel her appointment for 6/18. I sent her up front to reschedule. She wants to see Dr. Donnetta Hutching.            Thanks      Ebony Hail

## 2011-07-05 DIAGNOSIS — M1610 Unilateral primary osteoarthritis, unspecified hip: Secondary | ICD-10-CM | POA: Diagnosis not present

## 2011-07-05 DIAGNOSIS — Z86711 Personal history of pulmonary embolism: Secondary | ICD-10-CM | POA: Diagnosis not present

## 2011-07-05 DIAGNOSIS — Z7901 Long term (current) use of anticoagulants: Secondary | ICD-10-CM | POA: Diagnosis not present

## 2011-07-09 DIAGNOSIS — M1610 Unilateral primary osteoarthritis, unspecified hip: Secondary | ICD-10-CM | POA: Diagnosis not present

## 2011-07-09 DIAGNOSIS — Z7901 Long term (current) use of anticoagulants: Secondary | ICD-10-CM | POA: Diagnosis not present

## 2011-07-09 DIAGNOSIS — Z86711 Personal history of pulmonary embolism: Secondary | ICD-10-CM | POA: Diagnosis not present

## 2011-07-10 DIAGNOSIS — M1610 Unilateral primary osteoarthritis, unspecified hip: Secondary | ICD-10-CM | POA: Diagnosis not present

## 2011-07-10 DIAGNOSIS — Z7901 Long term (current) use of anticoagulants: Secondary | ICD-10-CM | POA: Diagnosis not present

## 2011-07-10 DIAGNOSIS — Z86711 Personal history of pulmonary embolism: Secondary | ICD-10-CM | POA: Diagnosis not present

## 2011-07-12 DIAGNOSIS — M1610 Unilateral primary osteoarthritis, unspecified hip: Secondary | ICD-10-CM | POA: Diagnosis not present

## 2011-07-12 DIAGNOSIS — Z7901 Long term (current) use of anticoagulants: Secondary | ICD-10-CM | POA: Diagnosis not present

## 2011-07-12 DIAGNOSIS — Z86711 Personal history of pulmonary embolism: Secondary | ICD-10-CM | POA: Diagnosis not present

## 2011-07-13 DIAGNOSIS — M169 Osteoarthritis of hip, unspecified: Secondary | ICD-10-CM | POA: Diagnosis not present

## 2011-07-13 DIAGNOSIS — Z5189 Encounter for other specified aftercare: Secondary | ICD-10-CM | POA: Diagnosis not present

## 2011-07-13 DIAGNOSIS — E785 Hyperlipidemia, unspecified: Secondary | ICD-10-CM | POA: Diagnosis not present

## 2011-07-13 DIAGNOSIS — IMO0001 Reserved for inherently not codable concepts without codable children: Secondary | ICD-10-CM | POA: Diagnosis not present

## 2011-07-13 DIAGNOSIS — E781 Pure hyperglyceridemia: Secondary | ICD-10-CM | POA: Diagnosis not present

## 2011-07-13 DIAGNOSIS — M1990 Unspecified osteoarthritis, unspecified site: Secondary | ICD-10-CM | POA: Diagnosis not present

## 2011-07-13 DIAGNOSIS — Z96649 Presence of unspecified artificial hip joint: Secondary | ICD-10-CM | POA: Diagnosis not present

## 2011-07-13 DIAGNOSIS — I739 Peripheral vascular disease, unspecified: Secondary | ICD-10-CM | POA: Diagnosis not present

## 2011-07-13 DIAGNOSIS — Z86711 Personal history of pulmonary embolism: Secondary | ICD-10-CM | POA: Diagnosis not present

## 2011-07-13 DIAGNOSIS — D649 Anemia, unspecified: Secondary | ICD-10-CM | POA: Diagnosis not present

## 2011-07-13 DIAGNOSIS — M1610 Unilateral primary osteoarthritis, unspecified hip: Secondary | ICD-10-CM | POA: Diagnosis not present

## 2011-07-13 DIAGNOSIS — Z7901 Long term (current) use of anticoagulants: Secondary | ICD-10-CM | POA: Diagnosis not present

## 2011-07-13 DIAGNOSIS — I2510 Atherosclerotic heart disease of native coronary artery without angina pectoris: Secondary | ICD-10-CM | POA: Diagnosis not present

## 2011-07-13 DIAGNOSIS — I10 Essential (primary) hypertension: Secondary | ICD-10-CM | POA: Diagnosis not present

## 2011-07-16 DIAGNOSIS — M1610 Unilateral primary osteoarthritis, unspecified hip: Secondary | ICD-10-CM | POA: Diagnosis not present

## 2011-07-16 DIAGNOSIS — Z86711 Personal history of pulmonary embolism: Secondary | ICD-10-CM | POA: Diagnosis not present

## 2011-07-16 DIAGNOSIS — Z7901 Long term (current) use of anticoagulants: Secondary | ICD-10-CM | POA: Diagnosis not present

## 2011-07-22 ENCOUNTER — Ambulatory Visit: Payer: Medicare Other

## 2011-07-23 DIAGNOSIS — Z86711 Personal history of pulmonary embolism: Secondary | ICD-10-CM | POA: Diagnosis not present

## 2011-07-23 DIAGNOSIS — Z7901 Long term (current) use of anticoagulants: Secondary | ICD-10-CM | POA: Diagnosis not present

## 2011-07-23 DIAGNOSIS — M1610 Unilateral primary osteoarthritis, unspecified hip: Secondary | ICD-10-CM | POA: Diagnosis not present

## 2011-07-26 DIAGNOSIS — Z86711 Personal history of pulmonary embolism: Secondary | ICD-10-CM | POA: Diagnosis not present

## 2011-07-26 DIAGNOSIS — M169 Osteoarthritis of hip, unspecified: Secondary | ICD-10-CM | POA: Diagnosis not present

## 2011-07-26 DIAGNOSIS — M1610 Unilateral primary osteoarthritis, unspecified hip: Secondary | ICD-10-CM | POA: Diagnosis not present

## 2011-07-26 DIAGNOSIS — Z7901 Long term (current) use of anticoagulants: Secondary | ICD-10-CM | POA: Diagnosis not present

## 2011-07-29 DIAGNOSIS — M1610 Unilateral primary osteoarthritis, unspecified hip: Secondary | ICD-10-CM | POA: Diagnosis not present

## 2011-07-29 DIAGNOSIS — I10 Essential (primary) hypertension: Secondary | ICD-10-CM | POA: Diagnosis not present

## 2011-07-29 DIAGNOSIS — E781 Pure hyperglyceridemia: Secondary | ICD-10-CM | POA: Diagnosis not present

## 2011-07-29 DIAGNOSIS — D649 Anemia, unspecified: Secondary | ICD-10-CM | POA: Diagnosis not present

## 2011-07-30 ENCOUNTER — Other Ambulatory Visit: Payer: Medicare Other

## 2011-07-30 ENCOUNTER — Ambulatory Visit: Payer: Medicare Other | Admitting: Vascular Surgery

## 2011-07-30 DIAGNOSIS — M1610 Unilateral primary osteoarthritis, unspecified hip: Secondary | ICD-10-CM | POA: Diagnosis not present

## 2011-07-30 DIAGNOSIS — Z7901 Long term (current) use of anticoagulants: Secondary | ICD-10-CM | POA: Diagnosis not present

## 2011-07-30 DIAGNOSIS — Z86711 Personal history of pulmonary embolism: Secondary | ICD-10-CM | POA: Diagnosis not present

## 2011-07-31 DIAGNOSIS — Z471 Aftercare following joint replacement surgery: Secondary | ICD-10-CM | POA: Diagnosis not present

## 2011-07-31 DIAGNOSIS — M169 Osteoarthritis of hip, unspecified: Secondary | ICD-10-CM | POA: Diagnosis not present

## 2011-07-31 DIAGNOSIS — Z5181 Encounter for therapeutic drug level monitoring: Secondary | ICD-10-CM | POA: Diagnosis not present

## 2011-07-31 DIAGNOSIS — I2510 Atherosclerotic heart disease of native coronary artery without angina pectoris: Secondary | ICD-10-CM | POA: Diagnosis not present

## 2011-07-31 DIAGNOSIS — D62 Acute posthemorrhagic anemia: Secondary | ICD-10-CM | POA: Diagnosis not present

## 2011-07-31 DIAGNOSIS — Z96649 Presence of unspecified artificial hip joint: Secondary | ICD-10-CM | POA: Diagnosis not present

## 2011-08-01 ENCOUNTER — Ambulatory Visit: Payer: Self-pay | Admitting: Cardiology

## 2011-08-01 DIAGNOSIS — D62 Acute posthemorrhagic anemia: Secondary | ICD-10-CM | POA: Diagnosis not present

## 2011-08-01 DIAGNOSIS — Z5181 Encounter for therapeutic drug level monitoring: Secondary | ICD-10-CM | POA: Diagnosis not present

## 2011-08-01 DIAGNOSIS — Z96649 Presence of unspecified artificial hip joint: Secondary | ICD-10-CM | POA: Diagnosis not present

## 2011-08-01 DIAGNOSIS — I2699 Other pulmonary embolism without acute cor pulmonale: Secondary | ICD-10-CM

## 2011-08-01 DIAGNOSIS — I2510 Atherosclerotic heart disease of native coronary artery without angina pectoris: Secondary | ICD-10-CM | POA: Diagnosis not present

## 2011-08-01 DIAGNOSIS — Z471 Aftercare following joint replacement surgery: Secondary | ICD-10-CM | POA: Diagnosis not present

## 2011-08-01 LAB — POCT INR: INR: 3.8

## 2011-08-01 MED ORDER — WARFARIN SODIUM 2.5 MG PO TABS: 2.5000 mg | ORAL_TABLET | Freq: Every day | ORAL | Status: DC

## 2011-08-02 DIAGNOSIS — I2510 Atherosclerotic heart disease of native coronary artery without angina pectoris: Secondary | ICD-10-CM | POA: Diagnosis not present

## 2011-08-02 DIAGNOSIS — Z96649 Presence of unspecified artificial hip joint: Secondary | ICD-10-CM | POA: Diagnosis not present

## 2011-08-02 DIAGNOSIS — Z471 Aftercare following joint replacement surgery: Secondary | ICD-10-CM | POA: Diagnosis not present

## 2011-08-02 DIAGNOSIS — Z5181 Encounter for therapeutic drug level monitoring: Secondary | ICD-10-CM | POA: Diagnosis not present

## 2011-08-02 DIAGNOSIS — D62 Acute posthemorrhagic anemia: Secondary | ICD-10-CM | POA: Diagnosis not present

## 2011-08-05 DIAGNOSIS — Z96649 Presence of unspecified artificial hip joint: Secondary | ICD-10-CM | POA: Diagnosis not present

## 2011-08-05 DIAGNOSIS — Z471 Aftercare following joint replacement surgery: Secondary | ICD-10-CM | POA: Diagnosis not present

## 2011-08-05 DIAGNOSIS — Z5181 Encounter for therapeutic drug level monitoring: Secondary | ICD-10-CM | POA: Diagnosis not present

## 2011-08-05 DIAGNOSIS — I2510 Atherosclerotic heart disease of native coronary artery without angina pectoris: Secondary | ICD-10-CM | POA: Diagnosis not present

## 2011-08-05 DIAGNOSIS — D62 Acute posthemorrhagic anemia: Secondary | ICD-10-CM | POA: Diagnosis not present

## 2011-08-07 ENCOUNTER — Ambulatory Visit: Payer: Self-pay | Admitting: Cardiology

## 2011-08-07 DIAGNOSIS — I2699 Other pulmonary embolism without acute cor pulmonale: Secondary | ICD-10-CM

## 2011-08-07 DIAGNOSIS — I2510 Atherosclerotic heart disease of native coronary artery without angina pectoris: Secondary | ICD-10-CM | POA: Diagnosis not present

## 2011-08-07 DIAGNOSIS — Z471 Aftercare following joint replacement surgery: Secondary | ICD-10-CM | POA: Diagnosis not present

## 2011-08-07 DIAGNOSIS — Z5181 Encounter for therapeutic drug level monitoring: Secondary | ICD-10-CM | POA: Diagnosis not present

## 2011-08-07 DIAGNOSIS — D518 Other vitamin B12 deficiency anemias: Secondary | ICD-10-CM | POA: Diagnosis not present

## 2011-08-07 DIAGNOSIS — D62 Acute posthemorrhagic anemia: Secondary | ICD-10-CM | POA: Diagnosis not present

## 2011-08-07 DIAGNOSIS — Z96649 Presence of unspecified artificial hip joint: Secondary | ICD-10-CM | POA: Diagnosis not present

## 2011-08-08 DIAGNOSIS — Z471 Aftercare following joint replacement surgery: Secondary | ICD-10-CM | POA: Diagnosis not present

## 2011-08-08 DIAGNOSIS — I2510 Atherosclerotic heart disease of native coronary artery without angina pectoris: Secondary | ICD-10-CM | POA: Diagnosis not present

## 2011-08-08 DIAGNOSIS — Z96649 Presence of unspecified artificial hip joint: Secondary | ICD-10-CM | POA: Diagnosis not present

## 2011-08-08 DIAGNOSIS — Z5181 Encounter for therapeutic drug level monitoring: Secondary | ICD-10-CM | POA: Diagnosis not present

## 2011-08-08 DIAGNOSIS — D62 Acute posthemorrhagic anemia: Secondary | ICD-10-CM | POA: Diagnosis not present

## 2011-08-13 DIAGNOSIS — D62 Acute posthemorrhagic anemia: Secondary | ICD-10-CM | POA: Diagnosis not present

## 2011-08-13 DIAGNOSIS — Z471 Aftercare following joint replacement surgery: Secondary | ICD-10-CM | POA: Diagnosis not present

## 2011-08-13 DIAGNOSIS — Z96649 Presence of unspecified artificial hip joint: Secondary | ICD-10-CM | POA: Diagnosis not present

## 2011-08-13 DIAGNOSIS — I2510 Atherosclerotic heart disease of native coronary artery without angina pectoris: Secondary | ICD-10-CM | POA: Diagnosis not present

## 2011-08-13 DIAGNOSIS — Z5181 Encounter for therapeutic drug level monitoring: Secondary | ICD-10-CM | POA: Diagnosis not present

## 2011-08-14 ENCOUNTER — Ambulatory Visit (INDEPENDENT_AMBULATORY_CARE_PROVIDER_SITE_OTHER): Payer: Medicare Other | Admitting: Internal Medicine

## 2011-08-14 DIAGNOSIS — Z5181 Encounter for therapeutic drug level monitoring: Secondary | ICD-10-CM | POA: Diagnosis not present

## 2011-08-14 DIAGNOSIS — I2699 Other pulmonary embolism without acute cor pulmonale: Secondary | ICD-10-CM

## 2011-08-14 DIAGNOSIS — Z471 Aftercare following joint replacement surgery: Secondary | ICD-10-CM | POA: Diagnosis not present

## 2011-08-14 DIAGNOSIS — Z96649 Presence of unspecified artificial hip joint: Secondary | ICD-10-CM | POA: Diagnosis not present

## 2011-08-14 DIAGNOSIS — I2510 Atherosclerotic heart disease of native coronary artery without angina pectoris: Secondary | ICD-10-CM | POA: Diagnosis not present

## 2011-08-14 DIAGNOSIS — D62 Acute posthemorrhagic anemia: Secondary | ICD-10-CM | POA: Diagnosis not present

## 2011-08-14 LAB — POCT INR: INR: 2.3

## 2011-08-15 DIAGNOSIS — I2510 Atherosclerotic heart disease of native coronary artery without angina pectoris: Secondary | ICD-10-CM | POA: Diagnosis not present

## 2011-08-15 DIAGNOSIS — D62 Acute posthemorrhagic anemia: Secondary | ICD-10-CM | POA: Diagnosis not present

## 2011-08-15 DIAGNOSIS — Z5181 Encounter for therapeutic drug level monitoring: Secondary | ICD-10-CM | POA: Diagnosis not present

## 2011-08-15 DIAGNOSIS — Z96649 Presence of unspecified artificial hip joint: Secondary | ICD-10-CM | POA: Diagnosis not present

## 2011-08-15 DIAGNOSIS — Z471 Aftercare following joint replacement surgery: Secondary | ICD-10-CM | POA: Diagnosis not present

## 2011-08-16 DIAGNOSIS — Z96649 Presence of unspecified artificial hip joint: Secondary | ICD-10-CM | POA: Diagnosis not present

## 2011-08-16 DIAGNOSIS — I2510 Atherosclerotic heart disease of native coronary artery without angina pectoris: Secondary | ICD-10-CM | POA: Diagnosis not present

## 2011-08-16 DIAGNOSIS — Z471 Aftercare following joint replacement surgery: Secondary | ICD-10-CM | POA: Diagnosis not present

## 2011-08-16 DIAGNOSIS — D62 Acute posthemorrhagic anemia: Secondary | ICD-10-CM | POA: Diagnosis not present

## 2011-08-16 DIAGNOSIS — Z5181 Encounter for therapeutic drug level monitoring: Secondary | ICD-10-CM | POA: Diagnosis not present

## 2011-08-19 DIAGNOSIS — Z96649 Presence of unspecified artificial hip joint: Secondary | ICD-10-CM | POA: Diagnosis not present

## 2011-08-19 DIAGNOSIS — I2510 Atherosclerotic heart disease of native coronary artery without angina pectoris: Secondary | ICD-10-CM | POA: Diagnosis not present

## 2011-08-19 DIAGNOSIS — Z5181 Encounter for therapeutic drug level monitoring: Secondary | ICD-10-CM | POA: Diagnosis not present

## 2011-08-19 DIAGNOSIS — Z471 Aftercare following joint replacement surgery: Secondary | ICD-10-CM | POA: Diagnosis not present

## 2011-08-19 DIAGNOSIS — D62 Acute posthemorrhagic anemia: Secondary | ICD-10-CM | POA: Diagnosis not present

## 2011-08-21 ENCOUNTER — Ambulatory Visit: Payer: Self-pay | Admitting: Internal Medicine

## 2011-08-21 DIAGNOSIS — Z96649 Presence of unspecified artificial hip joint: Secondary | ICD-10-CM | POA: Diagnosis not present

## 2011-08-21 DIAGNOSIS — Z5181 Encounter for therapeutic drug level monitoring: Secondary | ICD-10-CM | POA: Diagnosis not present

## 2011-08-21 DIAGNOSIS — Z471 Aftercare following joint replacement surgery: Secondary | ICD-10-CM | POA: Diagnosis not present

## 2011-08-21 DIAGNOSIS — I2699 Other pulmonary embolism without acute cor pulmonale: Secondary | ICD-10-CM

## 2011-08-21 DIAGNOSIS — D62 Acute posthemorrhagic anemia: Secondary | ICD-10-CM | POA: Diagnosis not present

## 2011-08-21 DIAGNOSIS — I2510 Atherosclerotic heart disease of native coronary artery without angina pectoris: Secondary | ICD-10-CM | POA: Diagnosis not present

## 2011-08-21 LAB — POCT INR: INR: 2.8

## 2011-08-22 DIAGNOSIS — Z471 Aftercare following joint replacement surgery: Secondary | ICD-10-CM | POA: Diagnosis not present

## 2011-08-22 DIAGNOSIS — Z5181 Encounter for therapeutic drug level monitoring: Secondary | ICD-10-CM | POA: Diagnosis not present

## 2011-08-22 DIAGNOSIS — D62 Acute posthemorrhagic anemia: Secondary | ICD-10-CM | POA: Diagnosis not present

## 2011-08-22 DIAGNOSIS — I2510 Atherosclerotic heart disease of native coronary artery without angina pectoris: Secondary | ICD-10-CM | POA: Diagnosis not present

## 2011-08-22 DIAGNOSIS — Z96649 Presence of unspecified artificial hip joint: Secondary | ICD-10-CM | POA: Diagnosis not present

## 2011-09-03 ENCOUNTER — Ambulatory Visit
Admission: RE | Admit: 2011-09-03 | Discharge: 2011-09-03 | Disposition: A | Discharge status: Home / Self Care | Payer: Medicare Other | Source: Ambulatory Visit | Attending: Internal Medicine | Admitting: Internal Medicine

## 2011-09-03 DIAGNOSIS — Z1231 Encounter for screening mammogram for malignant neoplasm of breast: Secondary | ICD-10-CM

## 2011-09-04 DIAGNOSIS — E785 Hyperlipidemia, unspecified: Secondary | ICD-10-CM | POA: Diagnosis not present

## 2011-09-04 DIAGNOSIS — Z79899 Other long term (current) drug therapy: Secondary | ICD-10-CM | POA: Diagnosis not present

## 2011-09-06 ENCOUNTER — Encounter: Payer: Self-pay | Admitting: Cardiology

## 2011-09-06 ENCOUNTER — Ambulatory Visit (INDEPENDENT_AMBULATORY_CARE_PROVIDER_SITE_OTHER): Payer: Medicare Other | Admitting: Pharmacist

## 2011-09-06 ENCOUNTER — Ambulatory Visit (INDEPENDENT_AMBULATORY_CARE_PROVIDER_SITE_OTHER): Payer: Medicare Other | Admitting: Cardiology

## 2011-09-06 VITALS — BP 118/86 | HR 70 | Ht 62.0 in | Wt 236.0 lb

## 2011-09-06 DIAGNOSIS — D649 Anemia, unspecified: Secondary | ICD-10-CM | POA: Diagnosis not present

## 2011-09-06 DIAGNOSIS — Z5181 Encounter for therapeutic drug level monitoring: Secondary | ICD-10-CM

## 2011-09-06 DIAGNOSIS — D62 Acute posthemorrhagic anemia: Secondary | ICD-10-CM

## 2011-09-06 DIAGNOSIS — Z7901 Long term (current) use of anticoagulants: Secondary | ICD-10-CM | POA: Diagnosis not present

## 2011-09-06 DIAGNOSIS — I119 Hypertensive heart disease without heart failure: Secondary | ICD-10-CM

## 2011-09-06 DIAGNOSIS — I2699 Other pulmonary embolism without acute cor pulmonale: Secondary | ICD-10-CM | POA: Diagnosis not present

## 2011-09-06 NOTE — Assessment & Plan Note (Signed)
No chest pain.  No pleuritic pain.

## 2011-09-06 NOTE — Assessment & Plan Note (Signed)
No headaches or dizziness.

## 2011-09-06 NOTE — Patient Instructions (Addendum)
Your physician recommends that you continue on your current medications as directed. Please refer to the Current Medication list given to you today.  Your physician recommends that you schedule a follow-up appointment in: 4 month ov/ekg

## 2011-09-06 NOTE — Progress Notes (Signed)
[name] Date of Birth:  02-03-1935 [facility] [address] [place], [place]  [address] [phone]  Fax   [phone]  HPI: This pleasant 77 year old woman is seen for a scheduled four-month followup office visit.  Since we last saw her she had successful left hip surgery by Dr. Wynelle Link.  Following surgery she went to [place] in place for 4 weeks for intensive physical therapy.  Not have any postoperative cardiovascular complications.  Specifically there was no evidence of any recurrent pulmonary emboli.  The patient remains on long-term Coumadin.  Her INR has been stable at 2.1.  She remains on warfarin 2.5 mg a day.  She has a history of essential hypertension and a history of right carotid artery occlusion.  She has a past history of pulmonary emboli in January 2001 after right hip surgery and at that time venous Dopplers of the legs were negative for a source of embolization and she has remained on long-term Coumadin.  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 early she does not have any significant coronary disease and she had cardiac catheterization in 1994 showing only mild plaque and she had a nuclear stress test in December 2011 showing no ischemia and her ejection fraction was 78%.  Current Outpatient Prescriptions  Medication Sig Dispense Refill  . acetaminophen (TYLENOL) 500 MG tablet Take 1,000 mg by mouth every 6 (six) hours as needed.      [name] Kitchen allopurinol (ZYLOPRIM) 300 MG tablet Take 1 tablet by mouth Daily.      [name] Kitchen amLODipine (NORVASC) 5 MG tablet Take 1 tablet by mouth Daily.      . Brimonidine Tartrate-Timolol (COMBIGAN OP) Apply 1 drop to eye 2 (two) times daily. RIGHT EYE      . CELEBREX 200 MG capsule Take 200 mg by mouth Daily.      . diphenhydrAMINE (BENADRYL) 25 mg capsule Take 25 mg by mouth at bedtime. For sleep      . fenofibrate 160 MG tablet 1 tablet. EVENING      .  HYDROcodone-acetaminophen (VICODIN) 5-500 MG per tablet as directed.      . indapamide (LOZOL) 2.5 MG tablet Take 2.5 mg by mouth Daily.      . Loratadine (CLARITIN PO) Take 1 tablet by mouth as needed.       . Magnesium 400 MG CAPS Take 1 capsule by mouth daily.       . metoprolol tartrate (LOPRESSOR) 25 MG tablet Take 1 tablet by mouth Twice daily.      [name] Kitchen omeprazole (PRILOSEC) 40 MG capsule Take 1 capsule by mouth Daily.      [name] Kitchen warfarin (COUMADIN) 2.5 MG tablet Take 1 tablet (2.5 mg total) by mouth daily.  30 tablet  1  . furosemide (LASIX) 40 MG tablet 1/2 TAB Every other day prn for fluid      . DISCONTD: amitriptyline (ELAVIL) 10 MG tablet Take 10 mg by mouth at bedtime.         Allergies  Allergen Reactions  . Ciprofloxacin     RASH  . Codeine     NAUSEA  . Cymbalta (Duloxetine Hcl)     Nausea VOMITING AND ABDOMINAL PAIN, HEADACHE, JUST ABOUT EVERY SIDE EFFECT THE DRUG HAS  . Penicillins     RASH  & ITCHING   --PT STATES SHE CAN TAKE KEFLEX PO AND IV CEPHALOSPORINS  .  Tetanus Toxoids     SWELLING, REDNESS  WHOLE ARM    Patient Active Problem List  Diagnosis  . Other pulmonary embolism and infarction  . Benign hypertensive heart disease without heart failure  . Fibromyalgia  . Dyslipidemia  . Gout  . Exogenous obesity  . Right carotid artery occlusion  . Osteoarthritis  . OA (osteoarthritis) of hip  . Postop Acute blood loss anemia    History  Smoking status  . Former Smoker -- 15 years  Smokeless tobacco  . Never Used  Comment: QUIT SMOKING 22 YRS AGO    History  Alcohol Use  . Yes    RARELY    Family History  Problem Relation Age of Onset  . Heart disease Father   . Heart disease Mother   . Stroke Mother   . Hypertension Brother   . Hypertension Brother   . Heart attack Father 61  . Aneurysm Mother     Review of Systems: The patient denies any heat or cold intolerance.  No weight gain or weight loss.  The patient denies headaches or blurry  vision.  There is no cough or sputum production.  The patient denies dizziness.  There is no hematuria or hematochezia.  The patient denies any muscle aches or arthritis.  The patient denies any rash.  The patient denies frequent falling or instability.  There is no history of depression or anxiety.  All other systems were reviewed and are negative.   Physical Exam: Filed Vitals:   09/06/11 0931  BP: 118/86  Pulse: 70   the general appearance reveals a well-developed moderately obese woman in no acute distress.  She is now walking with a cane.Pupils equal and reactive.   Extraocular Movements are full.  There is no scleral icterus.  The mouth and pharynx are normal.  The neck is supple.  The carotids reveal soft bruits.  The jugular venous pressure is normal.  The thyroid is not enlarged.  There is no lymphadenopathy.The chest is clear to percussion and auscultation. There are no rales or rhonchi. Expansion of the chest is symmetrical.  The precordium is quiet.  The first heart sound is normal.  The second heart sound is physiologically split.  There is no murmur gallop rub or click.  There is no abnormal lift or heave.  The abdomen is soft and nontender. Bowel sounds are normal. The liver and spleen are not enlarged. There Are no abdominal masses. There are no bruits.  Extremities reveal mild edema worse on the left than in the right leg and foot.Strength is normal and symmetrical in all extremities.  There is no lateralizing weakness.  There are no sensory deficits.  The skin is warm and dry.  There is no rash.       Assessment / Plan: Patient is to continue current medication.  She will be getting physical therapy for at least another 2 weeks to improve the strength in the right leg.  We will plan to see her in 4 months for followup office visit and EKG.

## 2011-09-06 NOTE — Assessment & Plan Note (Signed)
The patient has been anemic post surgery.  She states that her hemoglobin is still only about 10.  She does not tolerate iron very well because it causes diarrhea.  Her internist Dr. Shelia Media has been following her closely for her anemia.

## 2011-09-08 ENCOUNTER — Other Ambulatory Visit: Payer: Self-pay | Admitting: Cardiology

## 2011-09-09 ENCOUNTER — Encounter: Payer: Self-pay | Admitting: Neurosurgery

## 2011-09-09 DIAGNOSIS — H40059 Ocular hypertension, unspecified eye: Secondary | ICD-10-CM | POA: Diagnosis not present

## 2011-09-10 ENCOUNTER — Ambulatory Visit (INDEPENDENT_AMBULATORY_CARE_PROVIDER_SITE_OTHER): Payer: Medicare Other | Admitting: *Deleted

## 2011-09-10 ENCOUNTER — Encounter: Payer: Self-pay | Admitting: Neurosurgery

## 2011-09-10 ENCOUNTER — Ambulatory Visit (INDEPENDENT_AMBULATORY_CARE_PROVIDER_SITE_OTHER): Payer: Medicare Other | Admitting: Neurosurgery

## 2011-09-10 VITALS — BP 153/78 | HR 61 | Resp 16 | Ht 62.0 in | Wt 232.5 lb

## 2011-09-10 DIAGNOSIS — I6529 Occlusion and stenosis of unspecified carotid artery: Secondary | ICD-10-CM

## 2011-09-10 DIAGNOSIS — M169 Osteoarthritis of hip, unspecified: Secondary | ICD-10-CM | POA: Diagnosis not present

## 2011-09-10 NOTE — Addendum Note (Signed)
Addended by: Mena Goes on: 09/10/2011 02:36 PM   Modules accepted: Orders

## 2011-09-10 NOTE — Progress Notes (Signed)
[facility]  Carotid Office Note  CC: Six-month carotid surveillance Referring Physician: Early  History of Present Illness: 77 year old female patient of Dr. Donnetta Hutching seen for left-sided carotid stenosis with a known right ICA occlusion. The patient denies any signs or symptoms of CVA, TIA, amaurosis fugax or any neural deficit. The patient does state that she's had some orthopedic surgeries with Dr. Maureen Ralphs but currently is doing well and has no difficulty. The patient does continue Coumadin for a history of pulmonary embolism.  Past Medical History  Diagnosis Date  . Hypertension   . Hyperlipidemia   . Fibromyalgia   . Gout   . Tinnitus   . Obstructive sleep apnea on CPAP   . Acid reflux disease   . Pulmonary embolism     history of a postsurgical pulmonary embolism  . Vertigo   . Crohn disease   . Long term current use of anticoagulant   . Sleep apnea     DOES NOT KNOW SETTINGS - USES CPAP WHEN SLEEPING  . GERD (gastroesophageal reflux disease)   . Shortness of breath     WITH EXERTION--PT STATES LACK OF EXERCISE AND WEIGHT GAIN  . Recurrent upper respiratory infection (URI)     BRONCHITIS FEB 2013--SLIGHT COUGH NON-PRODUCTIVE NOW  . H/O hiatal hernia   . Peripheral vascular disease     KNOWN RIGHT INTERNAL CAROTID ARTERY OCCLUSION (NO STROKE)  --40 TO 59% STENOSIS LEFT ICA-FOLLOWED BY DR. EARLY WITH DOPPLER STUDY EVERY 6 MONTHS  . PVC (premature ventricular contraction)     PT STATES HX OF PVC'S ON EKG  . PONV (postoperative nausea and vomiting)   . Osteoarthritis     PAIN AND OA LEF T HIP AND BOTH SHOULDERS ARE BONE ON BONE AND PAINFUL    ROS: [x]  Positive   [ ]  Denies    General: [ ]  Weight loss, [ ]  Fever, [ ]  chills Neurologic: [ ]  Dizziness, [ ]  Blackouts, [ ]  Seizure [ ]  Stroke, [ ]  "Mini stroke", [ ]  Slurred speech, [ ]  Temporary blindness; [ ]  weakness in arms or legs, [ ]  Hoarseness Cardiac: [ ]  Chest pain/pressure, [ ]  Shortness of  breath at rest [ x] Shortness of breath with exertion, [ ]  Atrial fibrillation or irregular heartbeat Vascular: [ ]  Pain in legs with walking, [ ]  Pain in legs at rest, [ ]  Pain in legs at night,  [ ]  Non-healing ulcer, [ ]  Blood clot in vein/DVT,   Pulmonary: [ ]  Home oxygen, [ ]  Productive cough, [ ]  Coughing up blood, [ ]  Asthma,  [ ]  Wheezing Musculoskeletal:  [ ]  Arthritis, [ ]  Low back pain, [ ]  Joint pain Hematologic: [ ]  Easy Bruising, [ ]  Anemia; [ ]  Hepatitis Gastrointestinal: [ ]  Blood in stool, [ ]  Gastroesophageal Reflux/heartburn, [ ]  Trouble swallowing Urinary: [ ]  chronic Kidney disease, [ ]  on HD - [ ]  MWF or [ ]  TTHS, [ ]  Burning with urination, [ ]  Difficulty urinating Skin: [ ]  Rashes, [ ]  Wounds Psychological: [ ]  Anxiety, [ ]  Depression   Social History History  Substance Use Topics  . Smoking status: Former Smoker -- 15 years    Quit date: 02/11/1986  . Smokeless tobacco: Never Used   Comment: QUIT SMOKING 22 YRS AGO  . Alcohol Use: Yes     RARELY    Family History Family History  Problem Relation Age of Onset  . Heart disease Father     Heart  Disease before age 27  . Heart attack Father 41  . Hypertension Father   . Peripheral vascular disease Father     Right  leg amputation  . Heart disease Mother   . Stroke Mother   . Aneurysm Mother   . Hypertension Mother   . Hypertension Brother   . Hypertension Brother   . Diabetes Paternal Grandfather     Allergies  Allergen Reactions  . Ciprofloxacin     RASH  . Codeine     NAUSEA  . Cymbalta (Duloxetine Hcl)     Nausea VOMITING AND ABDOMINAL PAIN, HEADACHE, JUST ABOUT EVERY SIDE EFFECT THE DRUG HAS  . Penicillins     RASH  & ITCHING   --PT STATES SHE CAN TAKE KEFLEX PO AND IV CEPHALOSPORINS  . Tetanus Toxoids     SWELLING, REDNESS  WHOLE ARM    Current Outpatient Prescriptions  Medication Sig Dispense Refill  . acetaminophen (TYLENOL) 500 MG tablet Take 1,000 mg by mouth every 6 (six) hours  as needed.      [name] Kitchen allopurinol (ZYLOPRIM) 300 MG tablet Take 1 tablet by mouth Daily.      [name] Kitchen amLODipine (NORVASC) 5 MG tablet Take 1 tablet by mouth Daily.      . Brimonidine Tartrate-Timolol (COMBIGAN OP) Apply 1 drop to eye 2 (two) times daily. RIGHT EYE      . CELEBREX 200 MG capsule Take 200 mg by mouth Daily.      . diphenhydrAMINE (BENADRYL) 25 mg capsule Take 25 mg by mouth at bedtime. For sleep      . fenofibrate 160 MG tablet 1 tablet. EVENING      . furosemide (LASIX) 40 MG tablet 1/2 TAB Every other day prn for fluid  30 tablet  12  . HYDROcodone-acetaminophen (VICODIN) 5-500 MG per tablet as directed.      . indapamide (LOZOL) 2.5 MG tablet Take 2.5 mg by mouth Daily.      . Loratadine (CLARITIN PO) Take 1 tablet by mouth as needed.       . Magnesium 400 MG CAPS Take 1 capsule by mouth daily.       . metoprolol tartrate (LOPRESSOR) 25 MG tablet Take 1 tablet by mouth Twice daily.      [name] Kitchen omeprazole (PRILOSEC) 40 MG capsule Take 1 capsule by mouth Daily.      [name] Kitchen warfarin (COUMADIN) 2.5 MG tablet Take 1 tablet (2.5 mg total) by mouth daily.  30 tablet  1  . DISCONTD: amitriptyline (ELAVIL) 10 MG tablet Take 10 mg by mouth at bedtime.         Physical Examination  Filed Vitals:   09/10/11 1135  BP: 153/78  Pulse: 61  Resp:     Body mass index is 42.52 kg/(m^2).  General:  WDWN in NAD Gait: Normal HEENT: WNL Eyes: Pupils equal Pulmonary: normal non-labored breathing , without Rales, rhonchi,  wheezing Cardiac: RRR, without  Murmurs, rubs or gallops; Abdomen: soft, NT, no masses Skin: no rashes, ulcers noted  Vascular Exam Pulses: 3+ radial pulses bilaterally Carotid bruits: Right-sided occlusion, left side is a bruit audible Extremities without ischemic changes, no Gangrene , no cellulitis; no open wounds;  Musculoskeletal: no muscle wasting or atrophy   Neurologic: A&O X 3; Appropriate Affect ; SENSATION: normal; MOTOR FUNCTION:  moving all extremities equally.  Speech is fluent/normal  Non-Invasive Vascular Imaging CAROTID DUPLEX 09/10/2011  Right ICA 0ccluded stenosis Left ICA 40 - 59 % stenosis   ASSESSMENT/PLAN:  Asymptomatic patient who is today read as being in the 40-59% stenosis range on the left, the patient states she would rather, yearly basis whenever 6 months, I did offer her six-month surveillance due to the history of her having been in the 60- 79% range on the left. Patient states she will followup in one year so we'll go ahead and schedule a carotid Doppler for that time. Her questions were encouraged and answered.  Beatris Ship ANP Clinic MD: Early

## 2011-09-11 DIAGNOSIS — IMO0001 Reserved for inherently not codable concepts without codable children: Secondary | ICD-10-CM | POA: Diagnosis not present

## 2011-09-11 DIAGNOSIS — M1990 Unspecified osteoarthritis, unspecified site: Secondary | ICD-10-CM | POA: Diagnosis not present

## 2011-09-12 DIAGNOSIS — D649 Anemia, unspecified: Secondary | ICD-10-CM | POA: Diagnosis not present

## 2011-09-12 DIAGNOSIS — E785 Hyperlipidemia, unspecified: Secondary | ICD-10-CM | POA: Diagnosis not present

## 2011-09-13 DIAGNOSIS — M169 Osteoarthritis of hip, unspecified: Secondary | ICD-10-CM | POA: Diagnosis not present

## 2011-09-17 DIAGNOSIS — M169 Osteoarthritis of hip, unspecified: Secondary | ICD-10-CM | POA: Diagnosis not present

## 2011-09-17 NOTE — Procedures (Unsigned)
CAROTID DUPLEX EXAM  INDICATION:  Carotid disease.  HISTORY: Diabetes:  No. Cardiac:  No. Hypertension:  No. Smoking:  Previous. Previous Surgery:  No. CV History:  Known right ICA occlusion, currently asymptomatic. Amaurosis Fugax No, Paresthesias No, Hemiparesis No                                      RIGHT             LEFT Brachial systolic pressure: Brachial Doppler waveforms: Vertebral direction of flow:                          Antegrade DUPLEX VELOCITIES (cm/sec) CCA peak systolic                                     66 ECA peak systolic                                     086 ICA peak systolic                                     761 ICA end diastolic                                     41 PLAQUE MORPHOLOGY:                                    Mixed PLAQUE AMOUNT:                                        Moderate PLAQUE LOCATION:                                      ICA/ECA/CCA  IMPRESSION: 1. Doppler velocities suggest a high-end 40-59% stenosis of the left     proximal internal carotid artery. 2. Left external carotid artery stenosis noted. 3. Doppler velocities of the left internal and external carotid     arteries appear less than previously recorded when compared to the     previous exam on 01/28/2011.  ___________________________________________ Rosetta Posner, M.D.  CH/MEDQ  D:  09/13/2011  T:  09/13/2011  Job:  [id number]

## 2011-09-24 DIAGNOSIS — M1710 Unilateral primary osteoarthritis, unspecified knee: Secondary | ICD-10-CM | POA: Diagnosis not present

## 2011-09-26 DIAGNOSIS — M169 Osteoarthritis of hip, unspecified: Secondary | ICD-10-CM | POA: Diagnosis not present

## 2011-09-26 DIAGNOSIS — B372 Candidiasis of skin and nail: Secondary | ICD-10-CM | POA: Diagnosis not present

## 2011-09-26 DIAGNOSIS — L821 Other seborrheic keratosis: Secondary | ICD-10-CM | POA: Diagnosis not present

## 2011-09-26 DIAGNOSIS — L820 Inflamed seborrheic keratosis: Secondary | ICD-10-CM | POA: Diagnosis not present

## 2011-10-01 DIAGNOSIS — M169 Osteoarthritis of hip, unspecified: Secondary | ICD-10-CM | POA: Diagnosis not present

## 2011-10-08 DIAGNOSIS — M169 Osteoarthritis of hip, unspecified: Secondary | ICD-10-CM | POA: Diagnosis not present

## 2011-10-10 DIAGNOSIS — M169 Osteoarthritis of hip, unspecified: Secondary | ICD-10-CM | POA: Diagnosis not present

## 2011-10-17 DIAGNOSIS — D649 Anemia, unspecified: Secondary | ICD-10-CM | POA: Diagnosis not present

## 2011-10-18 ENCOUNTER — Ambulatory Visit (INDEPENDENT_AMBULATORY_CARE_PROVIDER_SITE_OTHER): Payer: Medicare Other | Admitting: Pharmacist

## 2011-10-18 DIAGNOSIS — I2699 Other pulmonary embolism without acute cor pulmonale: Secondary | ICD-10-CM | POA: Diagnosis not present

## 2011-10-23 DIAGNOSIS — Z23 Encounter for immunization: Secondary | ICD-10-CM | POA: Diagnosis not present

## 2011-10-28 ENCOUNTER — Other Ambulatory Visit: Payer: Self-pay | Admitting: Cardiology

## 2011-11-29 ENCOUNTER — Ambulatory Visit (INDEPENDENT_AMBULATORY_CARE_PROVIDER_SITE_OTHER): Payer: Medicare Other

## 2011-11-29 DIAGNOSIS — I2699 Other pulmonary embolism without acute cor pulmonale: Secondary | ICD-10-CM

## 2011-12-03 DIAGNOSIS — M949 Disorder of cartilage, unspecified: Secondary | ICD-10-CM | POA: Diagnosis not present

## 2011-12-03 DIAGNOSIS — N390 Urinary tract infection, site not specified: Secondary | ICD-10-CM | POA: Diagnosis not present

## 2011-12-03 DIAGNOSIS — E785 Hyperlipidemia, unspecified: Secondary | ICD-10-CM | POA: Diagnosis not present

## 2011-12-03 DIAGNOSIS — I10 Essential (primary) hypertension: Secondary | ICD-10-CM | POA: Diagnosis not present

## 2011-12-03 DIAGNOSIS — Z79899 Other long term (current) drug therapy: Secondary | ICD-10-CM | POA: Diagnosis not present

## 2011-12-03 DIAGNOSIS — M899 Disorder of bone, unspecified: Secondary | ICD-10-CM | POA: Diagnosis not present

## 2011-12-05 DIAGNOSIS — M1710 Unilateral primary osteoarthritis, unspecified knee: Secondary | ICD-10-CM | POA: Diagnosis not present

## 2011-12-05 DIAGNOSIS — M169 Osteoarthritis of hip, unspecified: Secondary | ICD-10-CM | POA: Diagnosis not present

## 2011-12-06 DIAGNOSIS — Z0000 Encounter for general adult medical examination without abnormal findings: Secondary | ICD-10-CM | POA: Diagnosis not present

## 2011-12-06 DIAGNOSIS — I10 Essential (primary) hypertension: Secondary | ICD-10-CM | POA: Diagnosis not present

## 2011-12-06 DIAGNOSIS — Z1212 Encounter for screening for malignant neoplasm of rectum: Secondary | ICD-10-CM | POA: Diagnosis not present

## 2011-12-06 DIAGNOSIS — R32 Unspecified urinary incontinence: Secondary | ICD-10-CM | POA: Diagnosis not present

## 2011-12-06 DIAGNOSIS — IMO0001 Reserved for inherently not codable concepts without codable children: Secondary | ICD-10-CM | POA: Diagnosis not present

## 2011-12-06 DIAGNOSIS — E785 Hyperlipidemia, unspecified: Secondary | ICD-10-CM | POA: Diagnosis not present

## 2011-12-09 DIAGNOSIS — H40019 Open angle with borderline findings, low risk, unspecified eye: Secondary | ICD-10-CM | POA: Diagnosis not present

## 2011-12-09 DIAGNOSIS — H2510 Age-related nuclear cataract, unspecified eye: Secondary | ICD-10-CM | POA: Diagnosis not present

## 2011-12-09 DIAGNOSIS — H4011X0 Primary open-angle glaucoma, stage unspecified: Secondary | ICD-10-CM | POA: Diagnosis not present

## 2011-12-11 DIAGNOSIS — Z0000 Encounter for general adult medical examination without abnormal findings: Secondary | ICD-10-CM | POA: Diagnosis not present

## 2011-12-25 DIAGNOSIS — N390 Urinary tract infection, site not specified: Secondary | ICD-10-CM | POA: Diagnosis not present

## 2012-01-03 ENCOUNTER — Ambulatory Visit (INDEPENDENT_AMBULATORY_CARE_PROVIDER_SITE_OTHER): Payer: Medicare Other | Admitting: *Deleted

## 2012-01-03 ENCOUNTER — Ambulatory Visit (INDEPENDENT_AMBULATORY_CARE_PROVIDER_SITE_OTHER): Payer: Medicare Other | Admitting: Cardiology

## 2012-01-03 ENCOUNTER — Encounter: Payer: Self-pay | Admitting: Cardiology

## 2012-01-03 VITALS — BP 130/72 | HR 62 | Resp 18 | Ht 62.0 in | Wt 233.8 lb

## 2012-01-03 DIAGNOSIS — E669 Obesity, unspecified: Secondary | ICD-10-CM | POA: Diagnosis not present

## 2012-01-03 DIAGNOSIS — I2699 Other pulmonary embolism without acute cor pulmonale: Secondary | ICD-10-CM

## 2012-01-03 DIAGNOSIS — I119 Hypertensive heart disease without heart failure: Secondary | ICD-10-CM | POA: Diagnosis not present

## 2012-01-03 DIAGNOSIS — E7800 Pure hypercholesterolemia, unspecified: Secondary | ICD-10-CM | POA: Diagnosis not present

## 2012-01-03 DIAGNOSIS — E6609 Other obesity due to excess calories: Secondary | ICD-10-CM

## 2012-01-03 NOTE — Assessment & Plan Note (Signed)
Patient has a long history of exogenous obesity.  Her weight today is down 3 pounds since previous visit.  She continues on a low cholesterol diet.

## 2012-01-03 NOTE — Assessment & Plan Note (Signed)
The patient remains on long-term Coumadin.  She has had no symptoms of recurrent pulmonary embolism or infarction.  He denies any unusual dyspnea.  She is relatively sedentary because of her orthopedic and musculoskeletal problems.

## 2012-01-03 NOTE — Patient Instructions (Addendum)
Your physician recommends that you continue on your current medications as directed. Please refer to the Current Medication list given to you today.  Your physician recommends that you schedule a follow-up appointment in: 4 month ov/ekg

## 2012-01-03 NOTE — Assessment & Plan Note (Signed)
Blood pressure is doing well on current therapy.  No dizzy spells or syncope.  No symptoms of congestive heart failure.  No chest pain.

## 2012-01-03 NOTE — Progress Notes (Signed)
[name] Date of Birth:  12-Jun-1934 [facility] [address] [place], [place]  [address] [phone]  Fax   [phone]  HPI: This pleasant 77 year old woman is seen for a scheduled four-month followup office visit. Since we last saw her she had successful left hip surgery by Dr. Wynelle Link. Following surgery she went to [facility] place for 4 weeks for intensive physical therapy.  The patient did not have any postoperative cardiovascular complications. Specifically there was no evidence of any recurrent pulmonary emboli. The patient remains on long-term Coumadin. Her INR has been stable. She has a history of essential hypertension and a history of right carotid artery occlusion. She has a past history of pulmonary emboli in January 2011 after right hip surgery and at that time venous Dopplers of the legs were negative for a source of embolization and she has remained on long-term Coumadin. 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 Donnetta Hutching.  She does not have any significant coronary disease and she had cardiac catheterization in 1994 showing only mild plaque and she had a nuclear stress test in December 2011 showing no ischemia and her ejection fraction was 78%.  Since last visit she's had no new cardiac symptoms.  She has continued to have a lot of discomfort from her fibromyalgia and is on hydrocodone 2 or 3 times a day from Dr. Ouida Sills, her rheumatologist.  Current Outpatient Prescriptions  Medication Sig Dispense Refill  . acetaminophen (TYLENOL) 500 MG tablet Take 1,000 mg by mouth every 6 (six) hours as needed.      [name] Kitchen allopurinol (ZYLOPRIM) 300 MG tablet Take 1 tablet by mouth Daily.      [name] Kitchen amLODipine (NORVASC) 5 MG tablet Take 1 tablet by mouth Daily.      . Brimonidine Tartrate-Timolol (COMBIGAN OP) Apply 1 drop to eye 2 (two) times daily. RIGHT EYE      . CELEBREX 200 MG capsule Take 200  mg by mouth Daily.      . diphenhydrAMINE (BENADRYL) 25 mg capsule Take 25 mg by mouth at bedtime. For sleep      . fenofibrate 160 MG tablet 1 tablet. EVENING      . furosemide (LASIX) 40 MG tablet 1/2 TAB Every other day prn for fluid  30 tablet  12  . HYDROcodone-acetaminophen (VICODIN) 5-500 MG per tablet as directed.      . indapamide (LOZOL) 2.5 MG tablet Take 2.5 mg by mouth Daily.      [name] Kitchen ketoconazole (NIZORAL) 2 % cream       . Loratadine (CLARITIN PO) Take 1 tablet by mouth as needed.       . Magnesium 400 MG CAPS Take 1 capsule by mouth daily.       . metoprolol tartrate (LOPRESSOR) 25 MG tablet Take 1 tablet by mouth Twice daily.      . nitrofurantoin, macrocrystal-monohydrate, (MACROBID) 100 MG capsule       . omeprazole (PRILOSEC) 40 MG capsule Take 1 capsule by mouth Daily.      [name] Kitchen warfarin (COUMADIN) 2.5 MG tablet Take as directed by coumadin clinic  30 tablet  3  . zolpidem (AMBIEN) 10 MG tablet       . [DISCONTINUED] amitriptyline (ELAVIL) 10 MG tablet Take 10 mg by mouth at bedtime.         Allergies  Allergen Reactions  . Ciprofloxacin  RASH  . Codeine     NAUSEA  . Cymbalta (Duloxetine Hcl)     Nausea VOMITING AND ABDOMINAL PAIN, HEADACHE, JUST ABOUT EVERY SIDE EFFECT THE DRUG HAS  . Penicillins     RASH  & ITCHING   --PT STATES SHE CAN TAKE KEFLEX PO AND IV CEPHALOSPORINS  . Tetanus Toxoids     SWELLING, REDNESS  WHOLE ARM    Patient Active Problem List  Diagnosis  . Other pulmonary embolism and infarction  . Benign hypertensive heart disease without heart failure  . Fibromyalgia  . Dyslipidemia  . Gout  . Exogenous obesity  . Right carotid artery occlusion  . Osteoarthritis  . OA (osteoarthritis) of hip  . Postop Acute blood loss anemia  . Occlusion and stenosis of carotid artery without mention of cerebral infarction    History  Smoking status  . Former Smoker -- 15 years  . Quit date: 02/11/1986  Smokeless tobacco  . Never Used    Comment:  QUIT SMOKING 22 YRS AGO    History  Alcohol Use  . Yes    Comment: RARELY    Family History  Problem Relation Age of Onset  . Heart disease Father     Heart Disease before age 75  . Heart attack Father 24  . Hypertension Father   . Peripheral vascular disease Father     Right  leg amputation  . Heart disease Mother   . Stroke Mother   . Aneurysm Mother   . Hypertension Mother   . Hypertension Brother   . Hypertension Brother   . Diabetes Paternal Grandfather     Review of Systems: The patient denies any heat or cold intolerance.  No weight gain or weight loss.  The patient denies headaches or blurry vision.  There is no cough or sputum production.  The patient denies dizziness.  There is no hematuria or hematochezia.  The patient denies any muscle aches or arthritis.  The patient denies any rash.  The patient denies frequent falling or instability.  There is no history of depression or anxiety.  All other systems were reviewed and are negative.   Physical Exam: Filed Vitals:   01/03/12 1023  BP: 130/72  Pulse: 62  Resp: 18   the general appearance reveals a large woman in no acute distress.The head and neck exam reveals pupils equal and reactive.  Extraocular movements are full.  There is no scleral icterus.  The mouth and pharynx are normal.  The neck is supple.  The carotids reveal a prominent left carotid bruit. The jugular venous pressure is normal.  The  thyroid is not enlarged.  There is no lymphadenopathy.  The chest is clear to percussion and auscultation.  There are no rales or rhonchi.  Expansion of the chest is symmetrical.  The precordium is quiet.  The first heart sound is normal.  The second heart sound is physiologically split.  There is no murmur gallop rub or click.  There is no abnormal lift or heave.  The abdomen is soft and nontender.  The bowel sounds are normal.  The liver and spleen are not enlarged.  There are no abdominal masses.  There are no abdominal  bruits.  Extremities reveal good pedal pulses.  There is no phlebitis or edema.  There is no cyanosis or clubbing.  Strength is normal and symmetrical in all extremities.  There is no lateralizing weakness.  There are no sensory deficits.  The skin is warm  and dry.  There is no rash.     Assessment / Plan:  Continue same medication.  Continue to work harder on weight loss.  Continue physical therapy to help with postoperative right sided weakness.  Recheck in 4 months for followup office visit and EKG

## 2012-01-06 ENCOUNTER — Other Ambulatory Visit: Payer: Self-pay | Admitting: Urology

## 2012-01-06 DIAGNOSIS — N393 Stress incontinence (female) (male): Secondary | ICD-10-CM | POA: Diagnosis not present

## 2012-01-06 DIAGNOSIS — I371 Nonrheumatic pulmonary valve insufficiency: Secondary | ICD-10-CM

## 2012-01-06 DIAGNOSIS — N3020 Other chronic cystitis without hematuria: Secondary | ICD-10-CM | POA: Diagnosis not present

## 2012-01-06 DIAGNOSIS — N390 Urinary tract infection, site not specified: Secondary | ICD-10-CM

## 2012-01-07 DIAGNOSIS — M1990 Unspecified osteoarthritis, unspecified site: Secondary | ICD-10-CM | POA: Diagnosis not present

## 2012-01-07 DIAGNOSIS — IMO0001 Reserved for inherently not codable concepts without codable children: Secondary | ICD-10-CM | POA: Diagnosis not present

## 2012-01-08 ENCOUNTER — Ambulatory Visit
Admission: RE | Admit: 2012-01-08 | Discharge: 2012-01-08 | Disposition: A | Discharge status: Home / Self Care | Payer: Medicare Other | Source: Ambulatory Visit | Attending: Urology | Admitting: Urology

## 2012-01-08 DIAGNOSIS — N390 Urinary tract infection, site not specified: Secondary | ICD-10-CM

## 2012-01-08 DIAGNOSIS — I371 Nonrheumatic pulmonary valve insufficiency: Secondary | ICD-10-CM

## 2012-01-17 DIAGNOSIS — K5000 Crohn's disease of small intestine without complications: Secondary | ICD-10-CM | POA: Diagnosis not present

## 2012-01-17 DIAGNOSIS — E559 Vitamin D deficiency, unspecified: Secondary | ICD-10-CM | POA: Diagnosis not present

## 2012-01-17 DIAGNOSIS — K219 Gastro-esophageal reflux disease without esophagitis: Secondary | ICD-10-CM | POA: Diagnosis not present

## 2012-02-10 DIAGNOSIS — E559 Vitamin D deficiency, unspecified: Secondary | ICD-10-CM | POA: Diagnosis not present

## 2012-02-12 HISTORY — PX: EYE SURGERY: SHX253

## 2012-02-14 ENCOUNTER — Ambulatory Visit (INDEPENDENT_AMBULATORY_CARE_PROVIDER_SITE_OTHER): Payer: Medicare Other | Admitting: *Deleted

## 2012-02-14 DIAGNOSIS — I2699 Other pulmonary embolism without acute cor pulmonale: Secondary | ICD-10-CM

## 2012-03-02 ENCOUNTER — Other Ambulatory Visit: Payer: Self-pay | Admitting: *Deleted

## 2012-03-02 MED ORDER — WARFARIN SODIUM 2.5 MG PO TABS: ORAL_TABLET | ORAL | Status: DC

## 2012-03-18 DIAGNOSIS — R300 Dysuria: Secondary | ICD-10-CM | POA: Diagnosis not present

## 2012-03-24 ENCOUNTER — Telehealth: Payer: Self-pay | Admitting: *Deleted

## 2012-03-24 NOTE — Telephone Encounter (Signed)
Pt called to inform us that she started Vantin on yesterday and she wants to cancel her appt that she has scheduled on Friday due to pending snow fall. Informed her that the drug can potentially affect her INIR so due to her wanting to move appt to Monday, please increase her Vitamin K intake and call our # for MD on call if she has any bleeding issues or concerns. Pt verbalizes understanding and will come in on Friday if weather is permittable. Appt R/S to Monday.

## 2012-03-30 ENCOUNTER — Ambulatory Visit (INDEPENDENT_AMBULATORY_CARE_PROVIDER_SITE_OTHER): Payer: Medicare Other | Admitting: Pharmacist

## 2012-03-30 DIAGNOSIS — I2699 Other pulmonary embolism without acute cor pulmonale: Secondary | ICD-10-CM | POA: Diagnosis not present

## 2012-03-30 DIAGNOSIS — N3020 Other chronic cystitis without hematuria: Secondary | ICD-10-CM | POA: Diagnosis not present

## 2012-04-08 DIAGNOSIS — M949 Disorder of cartilage, unspecified: Secondary | ICD-10-CM | POA: Diagnosis not present

## 2012-04-08 DIAGNOSIS — Z79899 Other long term (current) drug therapy: Secondary | ICD-10-CM | POA: Diagnosis not present

## 2012-04-08 DIAGNOSIS — E785 Hyperlipidemia, unspecified: Secondary | ICD-10-CM | POA: Diagnosis not present

## 2012-04-10 DIAGNOSIS — E669 Obesity, unspecified: Secondary | ICD-10-CM | POA: Diagnosis not present

## 2012-04-10 DIAGNOSIS — E785 Hyperlipidemia, unspecified: Secondary | ICD-10-CM | POA: Diagnosis not present

## 2012-04-10 DIAGNOSIS — IMO0001 Reserved for inherently not codable concepts without codable children: Secondary | ICD-10-CM | POA: Diagnosis not present

## 2012-04-20 DIAGNOSIS — R197 Diarrhea, unspecified: Secondary | ICD-10-CM | POA: Diagnosis not present

## 2012-04-22 DIAGNOSIS — L84 Corns and callosities: Secondary | ICD-10-CM | POA: Diagnosis not present

## 2012-05-05 DIAGNOSIS — M1990 Unspecified osteoarthritis, unspecified site: Secondary | ICD-10-CM | POA: Diagnosis not present

## 2012-05-05 DIAGNOSIS — IMO0001 Reserved for inherently not codable concepts without codable children: Secondary | ICD-10-CM | POA: Diagnosis not present

## 2012-05-06 DIAGNOSIS — M19019 Primary osteoarthritis, unspecified shoulder: Secondary | ICD-10-CM | POA: Diagnosis not present

## 2012-05-07 DIAGNOSIS — IMO0001 Reserved for inherently not codable concepts without codable children: Secondary | ICD-10-CM | POA: Diagnosis not present

## 2012-05-08 ENCOUNTER — Encounter: Payer: Self-pay | Admitting: Cardiology

## 2012-05-08 ENCOUNTER — Ambulatory Visit (INDEPENDENT_AMBULATORY_CARE_PROVIDER_SITE_OTHER): Payer: Medicare Other | Admitting: Cardiology

## 2012-05-08 ENCOUNTER — Ambulatory Visit (INDEPENDENT_AMBULATORY_CARE_PROVIDER_SITE_OTHER): Payer: Medicare Other

## 2012-05-08 VITALS — BP 130/68 | HR 72 | Ht 62.0 in | Wt 240.6 lb

## 2012-05-08 DIAGNOSIS — IMO0001 Reserved for inherently not codable concepts without codable children: Secondary | ICD-10-CM

## 2012-05-08 DIAGNOSIS — I6521 Occlusion and stenosis of right carotid artery: Secondary | ICD-10-CM

## 2012-05-08 DIAGNOSIS — I119 Hypertensive heart disease without heart failure: Secondary | ICD-10-CM | POA: Diagnosis not present

## 2012-05-08 DIAGNOSIS — M797 Fibromyalgia: Secondary | ICD-10-CM

## 2012-05-08 DIAGNOSIS — I2699 Other pulmonary embolism without acute cor pulmonale: Secondary | ICD-10-CM | POA: Diagnosis not present

## 2012-05-08 DIAGNOSIS — I6529 Occlusion and stenosis of unspecified carotid artery: Secondary | ICD-10-CM

## 2012-05-08 LAB — POCT INR: INR: 2.3

## 2012-05-08 NOTE — Assessment & Plan Note (Signed)
Blood pressure has been remaining stable on current therapy.  She has not been having any headaches dizziness or syncope.  No tachycardia.

## 2012-05-08 NOTE — Assessment & Plan Note (Signed)
The patient has a past history of pulmonary embolism.  She has been on long-term Coumadin and has been maintained in good levels of control.  We will plan to stop her Coumadin 5 days prior to shoulder surgery.  She is requesting not to have Lovenox bridging this time.  She had a bad time with Lovenox bridging last time.

## 2012-05-08 NOTE — Patient Instructions (Addendum)
Will send over surgical clearance  Your physician recommends that you continue on your current medications as directed. Please refer to the Current Medication list given to you today.  Your physician wants you to follow-up in: 4 month ov You will receive a reminder letter in the mail two months in advance. If you don't receive a letter, please call our office to schedule the follow-up appointment.

## 2012-05-08 NOTE — Assessment & Plan Note (Signed)
The patient has not been having any TIA symptoms referable to her known carotid artery disease.  Her Dopplers are being checked every 6 months and have been stable.

## 2012-05-08 NOTE — Assessment & Plan Note (Signed)
The patient recently had another trial of Lyrica.  In the past she had not tolerated it.  However Cymbalta did not work for her.  This time she states that she is tolerating the Lyrica and it has made a great deal of improvement in terms of her chronic pain.  She will omit her Lyrica on the morning of her shoulder surgery.

## 2012-05-08 NOTE — Progress Notes (Signed)
[name] Date of Birth:  Jun 12, 1934 [facility] [address] [place], [place]  [address] [phone]         Fax   [phone]  History of Present Illness: This pleasant 78 year old woman is seen for a scheduled four-month followup office visit. Since we last saw her she had successful left hip surgery by Dr. Wynelle Link. Following surgery she went to [facility] place for 4 weeks for intensive physical therapy. The patient did not have any postoperative cardiovascular complications. Specifically there was no evidence of any recurrent pulmonary emboli. The patient remains on long-term Coumadin. Her INR has been stable. She has a history of essential hypertension and a history of right carotid artery occlusion. She has a past history of pulmonary emboli in January 2011 after right hip surgery and at that time venous Dopplers of the legs were negative for a source of embolization and she has remained on long-term Coumadin. 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 Donnetta Hutching. She does not have any significant coronary disease and she had cardiac catheterization in 1994 showing only mild plaque and she had a nuclear stress test in December 2011 showing no ischemia and her ejection fraction was 78%. Since last visit she's had no new cardiac symptoms. She has continued to have a lot of discomfort from her fibromyalgia and is on hydrocodone 2 or 3 times a day from Dr. Ouida Sills, her rheumatologist.  The patient now has developed severe arthritic problems with her left shoulder and has some upcoming total shoulder replacement surgery by Dr. supple scheduled for 05/14/12. Current Outpatient Prescriptions  Medication Sig Dispense Refill  . allopurinol (ZYLOPRIM) 300 MG tablet Take 1 tablet by mouth Daily.      [name] Kitchen amLODipine (NORVASC) 5 MG tablet Take 1 tablet by mouth Daily.      . Brimonidine Tartrate-Timolol  (COMBIGAN OP) Apply 1 drop to eye 2 (two) times daily. RIGHT EYE      . CELEBREX 200 MG capsule Take 200 mg by mouth Daily.      . diphenhydrAMINE (BENADRYL) 25 mg capsule Take 25 mg by mouth at bedtime. For sleep      . fenofibrate 160 MG tablet Take 1 tablet by mouth daily. EVENING      . furosemide (LASIX) 40 MG tablet 1/2 TAB Every other day prn for fluid  30 tablet  12  . indapamide (LOZOL) 2.5 MG tablet Take 2.5 mg by mouth Daily.      [name] Kitchen ketoconazole (NIZORAL) 2 % cream Apply 1 application topically 2 (two) times daily.       . Magnesium 400 MG CAPS Take 1 capsule by mouth daily.       . metoprolol tartrate (LOPRESSOR) 25 MG tablet Take 1 tablet by mouth Twice daily.      [name] Kitchen omeprazole (PRILOSEC) 40 MG capsule Take 1 capsule by mouth Daily.      . pregabalin (LYRICA) 75 MG capsule Take 75 mg by mouth at bedtime.       . calcium citrate-vitamin D (CITRACAL+D) 315-200 MG-UNIT per tablet Take 1 tablet by mouth daily.      . cholecalciferol (VITAMIN D) 1000 UNITS tablet Take 3,000 Units by mouth daily.      [name] Kitchen HYDROcodone-acetaminophen (NORCO/VICODIN) 5-325 MG per tablet Take 1 tablet by mouth every 6 (six) hours as needed for pain.      [name] Kitchen loratadine (  CLARITIN) 10 MG tablet Take 10 mg by mouth daily as needed for allergies. For allergies      . Multiple Vitamins-Minerals (MULTIVITAMIN WITH MINERALS) tablet Take 1 tablet by mouth daily.      . nitrofurantoin (MACRODANTIN) 50 MG capsule Take 50 mg by mouth at bedtime.      [name] Kitchen warfarin (COUMADIN) 2.5 MG tablet Take 2.5 mg by mouth daily.      . [DISCONTINUED] amitriptyline (ELAVIL) 10 MG tablet Take 10 mg by mouth at bedtime.        No current facility-administered medications for this visit.    Allergies  Allergen Reactions  . Ciprofloxacin     RASH  . Codeine     NAUSEA  . Cymbalta (Duloxetine Hcl)     Nausea VOMITING AND ABDOMINAL PAIN, HEADACHE, JUST ABOUT EVERY SIDE EFFECT THE DRUG HAS  . Nortriptyline     Nightmares  . Penicillins      RASH  & ITCHING   --PT STATES SHE CAN TAKE KEFLEX PO AND IV CEPHALOSPORINS  . Statins     Leg cramps  . Tetanus Toxoids     SWELLING, REDNESS  WHOLE ARM    Patient Active Problem List  Diagnosis  . Other pulmonary embolism and infarction  . Benign hypertensive heart disease without heart failure  . Fibromyalgia  . Dyslipidemia  . Gout  . Exogenous obesity  . Right carotid artery occlusion  . Osteoarthritis  . OA (osteoarthritis) of hip  . Postop Acute blood loss anemia  . Occlusion and stenosis of carotid artery without mention of cerebral infarction    History  Smoking status  . Former Smoker -- 15 years  . Quit date: 02/11/1986  Smokeless tobacco  . Never Used    Comment: QUIT SMOKING 22 YRS AGO    History  Alcohol Use  . Yes    Comment: RARELY    Family History  Problem Relation Age of Onset  . Heart disease Father     Heart Disease before age 17  . Heart attack Father 77  . Hypertension Father   . Peripheral vascular disease Father     Right  leg amputation  . Heart disease Mother   . Stroke Mother   . Aneurysm Mother   . Hypertension Mother   . Hypertension Brother   . Hypertension Brother   . Diabetes Paternal Grandfather     Review of Systems: Constitutional: no fever chills diaphoresis or fatigue or change in weight.  Head and neck: no hearing loss, no epistaxis, no photophobia or visual disturbance. Respiratory: No cough, shortness of breath or wheezing. Cardiovascular: No chest pain peripheral edema, palpitations. Gastrointestinal: No abdominal distention, no abdominal pain, no change in bowel habits hematochezia or melena. Genitourinary: No dysuria, no frequency, no urgency, no nocturia. Musculoskeletal:No arthralgias, no back pain, no gait disturbance or myalgias. Neurological: No dizziness, no headaches, no numbness, no seizures, no syncope, no weakness, no tremors. Hematologic: No lymphadenopathy, no easy bruising. Psychiatric: No  confusion, no hallucinations, no sleep disturbance.    Physical Exam: Filed Vitals:   05/08/12 1015  BP: 130/68  Pulse: 72   the general appearance reveals a well-developed well-nourished somewhat overweight woman in no acute distress.The head and neck exam reveals pupils equal and reactive.  Extraocular movements are full.  There is no scleral icterus.  The mouth and pharynx are normal.  The neck is supple.  The carotids reveal no bruits.  The jugular venous pressure is normal.  The  thyroid is not enlarged.  There is no lymphadenopathy.  The chest is clear to percussion and auscultation.  There are no rales or rhonchi.  Expansion of the chest is symmetrical.  The precordium is quiet.  The first heart sound is normal.  The second heart sound is physiologically split.  There is no murmur gallop rub or click.  There is no abnormal lift or heave.  The abdomen is soft and nontender.  The bowel sounds are normal.  The liver and spleen are not enlarged.  There are no abdominal masses.  There are no abdominal bruits.  Extremities reveal good pedal pulses.  There is no phlebitis or edema.  There is no cyanosis or clubbing.  Strength is normal and symmetrical in all extremities.  There is no lateralizing weakness.  There are no sensory deficits.  The skin is warm and dry.  There is no rash.  EKG shows normal sinus rhythm with chronic inferolateral ST-T wave abnormalities which are unchanged as far back as 2011.  We gave her a copy of today's EKG to carry with her as well as a copy of the EKG done in 05/07/11.   Assessment / Plan: From the cardiac standpoint the patient is a satisfactory risk for shoulder surgery and cardiology clearance is given. The patient will be rechecked in 4 months for followup office visit.  She will restart her warfarin as soon after surgery as practical from a postoperative standpoint.

## 2012-05-11 ENCOUNTER — Encounter (HOSPITAL_COMMUNITY): Payer: Self-pay

## 2012-05-11 ENCOUNTER — Encounter (HOSPITAL_COMMUNITY)
Admission: RE | Admit: 2012-05-11 | Discharge: 2012-05-11 | Disposition: A | Discharge status: Home / Self Care | Payer: Medicare Other | Source: Ambulatory Visit | Attending: Orthopedic Surgery | Admitting: Orthopedic Surgery

## 2012-05-11 ENCOUNTER — Ambulatory Visit (HOSPITAL_COMMUNITY)
Admission: RE | Admit: 2012-05-11 | Discharge: 2012-05-11 | Disposition: A | Discharge status: Home / Self Care | Payer: Medicare Other | Source: Ambulatory Visit | Attending: Orthopedic Surgery | Admitting: Orthopedic Surgery

## 2012-05-11 DIAGNOSIS — Z01812 Encounter for preprocedural laboratory examination: Secondary | ICD-10-CM | POA: Insufficient documentation

## 2012-05-11 DIAGNOSIS — I10 Essential (primary) hypertension: Secondary | ICD-10-CM | POA: Diagnosis not present

## 2012-05-11 DIAGNOSIS — M1990 Unspecified osteoarthritis, unspecified site: Secondary | ICD-10-CM | POA: Insufficient documentation

## 2012-05-11 DIAGNOSIS — R0602 Shortness of breath: Secondary | ICD-10-CM | POA: Insufficient documentation

## 2012-05-11 DIAGNOSIS — Z01818 Encounter for other preprocedural examination: Secondary | ICD-10-CM | POA: Insufficient documentation

## 2012-05-11 DIAGNOSIS — G4730 Sleep apnea, unspecified: Secondary | ICD-10-CM | POA: Insufficient documentation

## 2012-05-11 LAB — BASIC METABOLIC PANEL
CO2: 26 mEq/L (ref 19–32)
Calcium: 9.3 mg/dL (ref 8.4–10.5)
Chloride: 105 mEq/L (ref 96–112)
Creatinine, Ser: 0.86 mg/dL (ref 0.50–1.10)
Glucose, Bld: 121 mg/dL — ABNORMAL HIGH (ref 70–99)

## 2012-05-11 LAB — CBC
HCT: 38.4 % (ref 36.0–46.0)
Hemoglobin: 12.7 g/dL (ref 12.0–15.0)
MCH: 27.6 pg (ref 26.0–34.0)
MCV: 83.5 fL (ref 78.0–100.0)
RBC: 4.6 MIL/uL (ref 3.87–5.11)
WBC: 11 10*3/uL — ABNORMAL HIGH (ref 4.0–10.5)

## 2012-05-11 LAB — ABO/RH: ABO/RH(D): A POS

## 2012-05-11 LAB — TYPE AND SCREEN
ABO/RH(D): A POS
Antibody Screen: NEGATIVE

## 2012-05-11 LAB — PROTIME-INR: Prothrombin Time: 16.4 seconds — ABNORMAL HIGH (ref 11.6–15.2)

## 2012-05-11 LAB — SURGICAL PCR SCREEN: Staphylococcus aureus: NEGATIVE

## 2012-05-11 NOTE — Progress Notes (Signed)
REQUESTED EKG, LAST OV, SLEEP STUDY, HEART CATH IF AVAILABLE FROM DR. BRACKBILL'S OFFICE.

## 2012-05-11 NOTE — Pre-Procedure Instructions (Signed)
KAYTEE TALIERCIO  05/11/2012   Your procedure is scheduled on: Thursday  05/14/12   Report to [facility] at 530 AM.  Call this number if you have problems the morning of surgery: [phone]   Remember:   Do not eat food or drink liquids after midnight.   Take these medicines the morning of surgery with A SIP OF WATER:  ALLOPURINOL, AMLODIPINE(NORVASC), EYE DROPS, HYDROCODONE IF NEEDED FOR PAIN, METOPROLOL(LOPRESSOR), PRILOSEC   Do not wear jewelry, make-up or nail polish.  Do not wear lotions, powders, or perfumes. You may wear deodorant.  Do not shave 48 hours prior to surgery. Men may shave face and neck.  Do not bring valuables to the hospital.  Contacts, dentures or bridgework may not be worn into surgery.  Leave suitcase in the car. After surgery it may be brought to your room.  For patients admitted to the hospital, checkout time is 11:00 AM the day of  discharge.   Patients discharged the day of surgery will not be allowed to drive  home.  Name and phone number of your driver:   Special Instructions: Shower using CHG 2 nights before surgery and the night before surgery.  If you shower the day of surgery use CHG.  Use special wash - you have one bottle of CHG for all showers.  You should use approximately 1/3 of the bottle for each shower.   Please read over the following fact sheets that you were given: Pain Booklet, Coughing and Deep Breathing, Blood Transfusion Information, MRSA Information and Surgical Site Infection Prevention

## 2012-05-12 NOTE — Progress Notes (Signed)
Anesthesia Chart Review:  Patient is a 78 year old female scheduled for left total shoulder arthroplasty on 05/14/12 by Dr. Onnie Graham.  History includes former smoker, morbid obesity, postoperative nausea and vomiting, hypertension, hyperlipidemia, gout, post-operative PE '11, Crohn's disease, GERD, hiatal hernia, osteoarthritis, obstructive sleep apnea, fibromyalgia, right carotid artery occlusion with 38-88% LICAS by duplex 2/80/03 (Dr. Donnetta Hutching), multiple orthopedic surgeries including left THA 06/17/11. PCP is Dr. Deland Pretty.  Cardiologist is Dr. Mare Ferrari.  He is aware of planned procedure and plans to have her hold her Coumadin 5 days prior to surgery.  She is not having Lovenox bridging as she had a "bad time" with this in the past.  According to his note from 05/08/12, "EKG shows normal sinus rhythm with chronic inferolateral ST-T wave abnormalities which are unchanged as far back as 2011. We gave her a copy of today's EKG to carry with her as well as a copy of the EKG done in 05/07/11."  [facility] is trying to locate the 05/08/12 EKG, otherwise she will need an EKG on the day of surgery.)  Echo on 02/23/09 showed: - Left ventricle: The cavity size was normal. Wall thickness was increased in a pattern of mild LVH. Systolic function was normal. The estimated ejection fraction was in the range of 60% to 65%. Wall motion was normal; there were no regional wall motion abnormalities. Doppler parameters are consistent with abnormal left ventricular relaxation (grade 1 diastolic dysfunction). - Right ventricle: There was mild hypertrophy. - Pericardium, extracardiac: A small pericardial effusion was identified posterior to the heart.  She had normal Nuclear stress study on 01/16/10 showing no ischemia, EF 78%.  CXR on 05/11/12 showed no acute cardiopulmonary abnormality.   Preoperative labs from 05/11/12 noted.  PT mildly elevated at 16.4, but INR is only 1.35, PTT 29.  Orders were pending at the time  of her PAT appointment, so any additional orders by Dr. Onnie Graham will need to be done on the day of surgery.  If no acute changes then would anticipate that she could proceed as planned.  George Hugh [facility] Short Stay Center/Anesthesiology Phone [phone] 05/12/2012 10:02 AM

## 2012-05-13 MED ORDER — DEXTROSE 5 % IV SOLN
3.0000 g | INTRAVENOUS | Status: DC
  Filled 2012-05-13: qty 3000

## 2012-05-13 NOTE — Progress Notes (Signed)
Requested Sleep study from Dr. Pennie Banter office

## 2012-05-14 ENCOUNTER — Encounter (HOSPITAL_COMMUNITY): Payer: Self-pay | Admitting: Vascular Surgery

## 2012-05-14 ENCOUNTER — Inpatient Hospital Stay (HOSPITAL_COMMUNITY): Payer: Medicare Other | Admitting: Anesthesiology

## 2012-05-14 ENCOUNTER — Inpatient Hospital Stay (HOSPITAL_COMMUNITY)
Admission: RE | Admit: 2012-05-14 | Discharge: 2012-05-15 | DRG: 484 | Disposition: A | Discharge status: Home Health | Payer: Medicare Other | Source: Ambulatory Visit | Attending: Orthopedic Surgery | Admitting: Orthopedic Surgery

## 2012-05-14 ENCOUNTER — Encounter (HOSPITAL_COMMUNITY): Payer: Self-pay | Admitting: *Deleted

## 2012-05-14 ENCOUNTER — Encounter (HOSPITAL_COMMUNITY)
Admission: RE | Disposition: A | Discharge status: Home Health | Payer: Self-pay | Source: Ambulatory Visit | Attending: Orthopedic Surgery

## 2012-05-14 DIAGNOSIS — Z86711 Personal history of pulmonary embolism: Secondary | ICD-10-CM | POA: Diagnosis not present

## 2012-05-14 DIAGNOSIS — E785 Hyperlipidemia, unspecified: Secondary | ICD-10-CM | POA: Diagnosis not present

## 2012-05-14 DIAGNOSIS — Z880 Allergy status to penicillin: Secondary | ICD-10-CM | POA: Diagnosis not present

## 2012-05-14 DIAGNOSIS — Z833 Family history of diabetes mellitus: Secondary | ICD-10-CM | POA: Diagnosis not present

## 2012-05-14 DIAGNOSIS — Z888 Allergy status to other drugs, medicaments and biological substances status: Secondary | ICD-10-CM

## 2012-05-14 DIAGNOSIS — Z8249 Family history of ischemic heart disease and other diseases of the circulatory system: Secondary | ICD-10-CM

## 2012-05-14 DIAGNOSIS — Z87891 Personal history of nicotine dependence: Secondary | ICD-10-CM

## 2012-05-14 DIAGNOSIS — M109 Gout, unspecified: Secondary | ICD-10-CM | POA: Diagnosis not present

## 2012-05-14 DIAGNOSIS — K219 Gastro-esophageal reflux disease without esophagitis: Secondary | ICD-10-CM | POA: Diagnosis not present

## 2012-05-14 DIAGNOSIS — IMO0001 Reserved for inherently not codable concepts without codable children: Secondary | ICD-10-CM | POA: Diagnosis present

## 2012-05-14 DIAGNOSIS — M19019 Primary osteoarthritis, unspecified shoulder: Principal | ICD-10-CM | POA: Diagnosis present

## 2012-05-14 DIAGNOSIS — G4733 Obstructive sleep apnea (adult) (pediatric): Secondary | ICD-10-CM | POA: Diagnosis present

## 2012-05-14 DIAGNOSIS — I10 Essential (primary) hypertension: Secondary | ICD-10-CM | POA: Diagnosis present

## 2012-05-14 DIAGNOSIS — Z887 Allergy status to serum and vaccine status: Secondary | ICD-10-CM | POA: Diagnosis not present

## 2012-05-14 DIAGNOSIS — Z823 Family history of stroke: Secondary | ICD-10-CM | POA: Diagnosis not present

## 2012-05-14 DIAGNOSIS — Z96659 Presence of unspecified artificial knee joint: Secondary | ICD-10-CM

## 2012-05-14 DIAGNOSIS — K5090 Crohn's disease, unspecified, without complications: Secondary | ICD-10-CM | POA: Diagnosis not present

## 2012-05-14 DIAGNOSIS — Z881 Allergy status to other antibiotic agents status: Secondary | ICD-10-CM

## 2012-05-14 DIAGNOSIS — I6529 Occlusion and stenosis of unspecified carotid artery: Secondary | ICD-10-CM | POA: Diagnosis present

## 2012-05-14 DIAGNOSIS — Z96649 Presence of unspecified artificial hip joint: Secondary | ICD-10-CM

## 2012-05-14 HISTORY — DX: Urinary tract infection, site not specified: N39.0

## 2012-05-14 HISTORY — DX: Personal history of other diseases of the digestive system: Z87.19

## 2012-05-14 HISTORY — DX: Crohn's disease, unspecified, without complications: K50.90

## 2012-05-14 HISTORY — DX: Pure hyperglyceridemia: E78.1

## 2012-05-14 HISTORY — PX: TOTAL SHOULDER ARTHROPLASTY: SHX126

## 2012-05-14 SURGERY — ARTHROPLASTY, SHOULDER, TOTAL
Anesthesia: General | Site: Shoulder | Laterality: Left | Wound class: Clean

## 2012-05-14 MED ORDER — POLYETHYLENE GLYCOL 3350 17 G PO PACK: 17.0000 g | PACK | Freq: Every day | ORAL | Status: DC | PRN

## 2012-05-14 MED ORDER — DEXMEDETOMIDINE HCL IN NACL 400 MCG/100ML IV SOLN
0.4000 ug/kg/h | INTRAVENOUS | Status: DC
  Filled 2012-05-14: qty 100

## 2012-05-14 MED ORDER — HYDROMORPHONE HCL PF 1 MG/ML IJ SOLN
INTRAMUSCULAR | Status: AC
  Filled 2012-05-14: qty 1

## 2012-05-14 MED ORDER — CEFAZOLIN SODIUM-DEXTROSE 2-3 GM-% IV SOLR
INTRAVENOUS | Status: AC
  Administered 2012-05-14: 3 g via INTRAVENOUS
  Filled 2012-05-14: qty 50

## 2012-05-14 MED ORDER — BISACODYL 5 MG PO TBEC: 5.0000 mg | DELAYED_RELEASE_TABLET | Freq: Every day | ORAL | Status: DC | PRN

## 2012-05-14 MED ORDER — SODIUM CHLORIDE 0.9 % IJ SOLN
INTRAMUSCULAR | Status: DC | PRN
  Administered 2012-05-14: 10 mL

## 2012-05-14 MED ORDER — DIPHENHYDRAMINE HCL 12.5 MG/5ML PO ELIX: 12.5000 mg | ORAL_SOLUTION | ORAL | Status: DC | PRN

## 2012-05-14 MED ORDER — DOCUSATE SODIUM 100 MG PO CAPS
100.0000 mg | ORAL_CAPSULE | Freq: Two times a day (BID) | ORAL | Status: DC
  Administered 2012-05-14: 100 mg via ORAL
  Filled 2012-05-14 (×3): qty 1

## 2012-05-14 MED ORDER — METHOCARBAMOL 500 MG PO TABS: 500.0000 mg | ORAL_TABLET | Freq: Four times a day (QID) | ORAL | Status: DC | PRN

## 2012-05-14 MED ORDER — LACTATED RINGERS IV SOLN
INTRAVENOUS | Status: DC
  Administered 2012-05-14: via INTRAVENOUS

## 2012-05-14 MED ORDER — COUMADIN BOOK
1.0000 | Freq: Once | Status: AC
  Administered 2012-05-14: 1
  Filled 2012-05-14: qty 1

## 2012-05-14 MED ORDER — TEMAZEPAM 15 MG PO CAPS: 15.0000 mg | ORAL_CAPSULE | Freq: Every evening | ORAL | Status: DC | PRN

## 2012-05-14 MED ORDER — CEFAZOLIN SODIUM 1-5 GM-% IV SOLN
1.0000 g | Freq: Four times a day (QID) | INTRAVENOUS | Status: AC
  Administered 2012-05-14 – 2012-05-15 (×3): 1 g via INTRAVENOUS
  Filled 2012-05-14 (×3): qty 50

## 2012-05-14 MED ORDER — ONDANSETRON HCL 4 MG/2ML IJ SOLN: 4.0000 mg | Freq: Four times a day (QID) | INTRAMUSCULAR | Status: DC | PRN

## 2012-05-14 MED ORDER — GLYCOPYRROLATE 0.2 MG/ML IJ SOLN
INTRAMUSCULAR | Status: DC | PRN
  Administered 2012-05-14: 0.4 mg via INTRAVENOUS

## 2012-05-14 MED ORDER — BUPIVACAINE LIPOSOME 1.3 % IJ SUSP
INTRAMUSCULAR | Status: DC | PRN
  Administered 2012-05-14: 20 mL

## 2012-05-14 MED ORDER — WARFARIN - PHYSICIAN DOSING INPATIENT
Freq: Every day | Status: DC
  Administered 2012-05-14: 18:00:00

## 2012-05-14 MED ORDER — KETOROLAC TROMETHAMINE 30 MG/ML IJ SOLN
INTRAMUSCULAR | Status: AC
  Filled 2012-05-14: qty 1

## 2012-05-14 MED ORDER — ALLOPURINOL 300 MG PO TABS
300.0000 mg | ORAL_TABLET | Freq: Every day | ORAL | Status: DC
  Administered 2012-05-15: 300 mg via ORAL
  Filled 2012-05-14 (×2): qty 1

## 2012-05-14 MED ORDER — PREGABALIN 25 MG PO CAPS
75.0000 mg | ORAL_CAPSULE | Freq: Every day | ORAL | Status: DC
  Administered 2012-05-14: 75 mg via ORAL
  Filled 2012-05-14: qty 1

## 2012-05-14 MED ORDER — SODIUM CHLORIDE 0.9 % IR SOLN
Status: DC | PRN
  Administered 2012-05-14: 1000 mL

## 2012-05-14 MED ORDER — PROPOFOL 10 MG/ML IV BOLUS
INTRAVENOUS | Status: DC | PRN
  Administered 2012-05-14: 140 mg via INTRAVENOUS

## 2012-05-14 MED ORDER — ZOLPIDEM TARTRATE 5 MG PO TABS: 5.0000 mg | ORAL_TABLET | Freq: Every evening | ORAL | Status: DC | PRN

## 2012-05-14 MED ORDER — METOCLOPRAMIDE HCL 10 MG PO TABS: 5.0000 mg | ORAL_TABLET | Freq: Three times a day (TID) | ORAL | Status: DC | PRN

## 2012-05-14 MED ORDER — ACETAMINOPHEN 325 MG PO TABS
650.0000 mg | ORAL_TABLET | Freq: Four times a day (QID) | ORAL | Status: DC | PRN
  Filled 2012-05-14: qty 2

## 2012-05-14 MED ORDER — DEXMEDETOMIDINE HCL IN NACL 400 MCG/100ML IV SOLN
INTRAVENOUS | Status: DC | PRN
  Administered 2012-05-14: 0.4 ug/kg/h via INTRAVENOUS

## 2012-05-14 MED ORDER — CHLORHEXIDINE GLUCONATE 4 % EX LIQD: 60.0000 mL | Freq: Once | CUTANEOUS | Status: DC

## 2012-05-14 MED ORDER — LABETALOL HCL 5 MG/ML IV SOLN
INTRAVENOUS | Status: DC | PRN
  Administered 2012-05-14 (×2): 10 mg via INTRAVENOUS

## 2012-05-14 MED ORDER — FLEET ENEMA 7-19 GM/118ML RE ENEM: 1.0000 | ENEMA | Freq: Once | RECTAL | Status: AC | PRN

## 2012-05-14 MED ORDER — PANTOPRAZOLE SODIUM 40 MG PO TBEC
40.0000 mg | DELAYED_RELEASE_TABLET | Freq: Every day | ORAL | Status: DC
  Administered 2012-05-14: 40 mg via ORAL
  Filled 2012-05-14: qty 1

## 2012-05-14 MED ORDER — ONDANSETRON HCL 4 MG PO TABS: 4.0000 mg | ORAL_TABLET | Freq: Four times a day (QID) | ORAL | Status: DC | PRN

## 2012-05-14 MED ORDER — WARFARIN SODIUM 2.5 MG PO TABS
2.5000 mg | ORAL_TABLET | Freq: Every day | ORAL | Status: DC
  Administered 2012-05-14: 2.5 mg via ORAL
  Filled 2012-05-14 (×2): qty 1

## 2012-05-14 MED ORDER — ALUM & MAG HYDROXIDE-SIMETH 200-200-20 MG/5ML PO SUSP: 30.0000 mL | ORAL | Status: DC | PRN

## 2012-05-14 MED ORDER — HYDROMORPHONE HCL PF 1 MG/ML IJ SOLN
0.2500 mg | INTRAMUSCULAR | Status: DC | PRN
  Administered 2012-05-14 (×5): 0.25 mg via INTRAVENOUS

## 2012-05-14 MED ORDER — KETOROLAC TROMETHAMINE 15 MG/ML IJ SOLN
15.0000 mg | Freq: Four times a day (QID) | INTRAMUSCULAR | Status: DC
  Administered 2012-05-14 – 2012-05-15 (×4): 15 mg via INTRAVENOUS
  Filled 2012-05-14 (×7): qty 1

## 2012-05-14 MED ORDER — MIDAZOLAM HCL 5 MG/5ML IJ SOLN
INTRAMUSCULAR | Status: DC | PRN
  Administered 2012-05-14 (×2): 1 mg via INTRAVENOUS

## 2012-05-14 MED ORDER — HYDROMORPHONE HCL PF 1 MG/ML IJ SOLN
0.2500 mg | INTRAMUSCULAR | Status: DC | PRN
  Administered 2012-05-14 (×2): 1 mg via INTRAVENOUS
  Filled 2012-05-14: qty 1

## 2012-05-14 MED ORDER — NEOSTIGMINE METHYLSULFATE 1 MG/ML IJ SOLN
INTRAMUSCULAR | Status: DC | PRN
  Administered 2012-05-14: 3 mg via INTRAVENOUS

## 2012-05-14 MED ORDER — ACETAMINOPHEN 10 MG/ML IV SOLN: 1000.0000 mg | Freq: Once | INTRAVENOUS | Status: DC | PRN

## 2012-05-14 MED ORDER — ROCURONIUM BROMIDE 100 MG/10ML IV SOLN
INTRAVENOUS | Status: DC | PRN
  Administered 2012-05-14: 50 mg via INTRAVENOUS

## 2012-05-14 MED ORDER — INDAPAMIDE 2.5 MG PO TABS
2.5000 mg | ORAL_TABLET | Freq: Every day | ORAL | Status: DC
  Administered 2012-05-15: 2.5 mg via ORAL
  Filled 2012-05-14 (×2): qty 1

## 2012-05-14 MED ORDER — LIDOCAINE HCL (CARDIAC) 20 MG/ML IV SOLN
INTRAVENOUS | Status: DC | PRN
  Administered 2012-05-14: 40 mg via INTRAVENOUS

## 2012-05-14 MED ORDER — OXYCODONE-ACETAMINOPHEN 5-325 MG PO TABS
1.0000 | ORAL_TABLET | ORAL | Status: DC | PRN
  Administered 2012-05-15 (×2): 1 via ORAL
  Filled 2012-05-14 (×2): qty 1

## 2012-05-14 MED ORDER — ONDANSETRON HCL 4 MG/2ML IJ SOLN: 4.0000 mg | Freq: Once | INTRAMUSCULAR | Status: DC | PRN

## 2012-05-14 MED ORDER — LORATADINE 10 MG PO TABS
10.0000 mg | ORAL_TABLET | Freq: Every day | ORAL | Status: DC | PRN
  Filled 2012-05-14: qty 1

## 2012-05-14 MED ORDER — METHOCARBAMOL 100 MG/ML IJ SOLN
500.0000 mg | Freq: Four times a day (QID) | INTRAVENOUS | Status: DC | PRN
  Filled 2012-05-14: qty 5

## 2012-05-14 MED ORDER — LACTATED RINGERS IV SOLN: INTRAVENOUS | Status: DC

## 2012-05-14 MED ORDER — NITROFURANTOIN MACROCRYSTAL 50 MG PO CAPS
50.0000 mg | ORAL_CAPSULE | Freq: Every day | ORAL | Status: DC
  Administered 2012-05-14: 50 mg via ORAL
  Filled 2012-05-14 (×2): qty 1

## 2012-05-14 MED ORDER — DEXAMETHASONE SODIUM PHOSPHATE 10 MG/ML IJ SOLN
INTRAMUSCULAR | Status: DC | PRN
  Administered 2012-05-14: 8 mg via INTRAVENOUS

## 2012-05-14 MED ORDER — PHENOL 1.4 % MT LIQD: 1.0000 | OROMUCOSAL | Status: DC | PRN

## 2012-05-14 MED ORDER — ACETAMINOPHEN 650 MG RE SUPP: 650.0000 mg | Freq: Four times a day (QID) | RECTAL | Status: DC | PRN

## 2012-05-14 MED ORDER — AMLODIPINE BESYLATE 5 MG PO TABS
5.0000 mg | ORAL_TABLET | Freq: Every day | ORAL | Status: DC
  Administered 2012-05-15: 5 mg via ORAL
  Filled 2012-05-14: qty 1

## 2012-05-14 MED ORDER — ACETAMINOPHEN 10 MG/ML IV SOLN
INTRAVENOUS | Status: AC
  Administered 2012-05-14: 1000 mg via INTRAVENOUS
  Filled 2012-05-14: qty 100

## 2012-05-14 MED ORDER — ONDANSETRON HCL 4 MG/2ML IJ SOLN
INTRAMUSCULAR | Status: DC | PRN
  Administered 2012-05-14: 4 mg via INTRAVENOUS

## 2012-05-14 MED ORDER — WARFARIN VIDEO: 1.0000 | Freq: Once | Status: DC

## 2012-05-14 MED ORDER — LACTATED RINGERS IV SOLN
INTRAVENOUS | Status: DC | PRN
  Administered 2012-05-14 (×2): via INTRAVENOUS

## 2012-05-14 MED ORDER — METOPROLOL TARTRATE 25 MG PO TABS
25.0000 mg | ORAL_TABLET | Freq: Two times a day (BID) | ORAL | Status: DC
  Administered 2012-05-14 – 2012-05-15 (×2): 25 mg via ORAL
  Filled 2012-05-14 (×3): qty 1

## 2012-05-14 MED ORDER — FENOFIBRATE 160 MG PO TABS
160.0000 mg | ORAL_TABLET | Freq: Every day | ORAL | Status: DC
Start: 2012-05-14 — End: 2012-05-15
  Administered 2012-05-15: 160 mg via ORAL
  Filled 2012-05-14 (×2): qty 1

## 2012-05-14 MED ORDER — FENTANYL CITRATE 0.05 MG/ML IJ SOLN
INTRAMUSCULAR | Status: DC | PRN
  Administered 2012-05-14 (×2): 100 ug via INTRAVENOUS
  Administered 2012-05-14 (×3): 50 ug via INTRAVENOUS

## 2012-05-14 MED ORDER — METOCLOPRAMIDE HCL 5 MG/ML IJ SOLN: 5.0000 mg | Freq: Three times a day (TID) | INTRAMUSCULAR | Status: DC | PRN

## 2012-05-14 MED ORDER — MENTHOL 3 MG MT LOZG: 1.0000 | LOZENGE | OROMUCOSAL | Status: DC | PRN

## 2012-05-14 MED ORDER — CEFAZOLIN SODIUM 1-5 GM-% IV SOLN
INTRAVENOUS | Status: AC
  Filled 2012-05-14: qty 50

## 2012-05-14 SURGICAL SUPPLY — 61 items
BLADE SAW SGTL 83.5X18.5 (BLADE) ×2 IMPLANT
CEMENT BONE DEPUY (Cement) ×1 IMPLANT
CLOTH BEACON ORANGE TIMEOUT ST (SAFETY) ×2 IMPLANT
CLSR STERI-STRIP ANTIMIC 1/2X4 (GAUZE/BANDAGES/DRESSINGS) ×1 IMPLANT
COVER SURGICAL LIGHT HANDLE (MISCELLANEOUS) ×2 IMPLANT
DRAPE INCISE IOBAN 66X45 STRL (DRAPES) ×2 IMPLANT
DRAPE SURG 17X11 SM STRL (DRAPES) ×2 IMPLANT
DRAPE SURG 17X23 STRL (DRAPES) ×2 IMPLANT
DRAPE U-SHAPE 47X51 STRL (DRAPES) ×2 IMPLANT
DRILL BIT 7/64X5 (BIT) IMPLANT
DRSG AQUACEL AG ADV 3.5X10 (GAUZE/BANDAGES/DRESSINGS) ×2 IMPLANT
DRSG MEPILEX BORDER 4X8 (GAUZE/BANDAGES/DRESSINGS) ×2 IMPLANT
DURAPREP 26ML APPLICATOR (WOUND CARE) ×4 IMPLANT
ELECT BLADE 4.0 EZ CLEAN MEGAD (MISCELLANEOUS) ×2
ELECT CAUTERY BLADE 6.4 (BLADE) ×2 IMPLANT
ELECT REM PT RETURN 9FT ADLT (ELECTROSURGICAL) ×2
ELECTRODE BLDE 4.0 EZ CLN MEGD (MISCELLANEOUS) ×1 IMPLANT
ELECTRODE REM PT RTRN 9FT ADLT (ELECTROSURGICAL) ×1 IMPLANT
FACESHIELD LNG OPTICON STERILE (SAFETY) ×6 IMPLANT
GLENOID ANCHOR PEG CROSSLK 44 (Orthopedic Implant) ×1 IMPLANT
GLOVE BIO SURGEON STRL SZ7.5 (GLOVE) ×2 IMPLANT
GLOVE BIO SURGEON STRL SZ8 (GLOVE) ×2 IMPLANT
GLOVE EUDERMIC 7 POWDERFREE (GLOVE) ×2 IMPLANT
GLOVE SS BIOGEL STRL SZ 7.5 (GLOVE) ×1 IMPLANT
GLOVE SUPERSENSE BIOGEL SZ 7.5 (GLOVE) ×1
GOWN STRL NON-REIN LRG LVL3 (GOWN DISPOSABLE) ×2 IMPLANT
GOWN STRL REIN XL XLG (GOWN DISPOSABLE) ×4 IMPLANT
HEAD HUM ECCENTRIC 44X18 STRL (Trauma) ×1 IMPLANT
KIT BASIN OR (CUSTOM PROCEDURE TRAY) ×2 IMPLANT
KIT ROOM TURNOVER OR (KITS) ×2 IMPLANT
MANIFOLD NEPTUNE II (INSTRUMENTS) ×2 IMPLANT
NDL HYPO 25GX1X1/2 BEV (NEEDLE) IMPLANT
NDL SUT 6 .5 CRC .975X.05 MAYO (NEEDLE) ×1 IMPLANT
NEEDLE HYPO 25GX1X1/2 BEV (NEEDLE) IMPLANT
NEEDLE MAYO TAPER (NEEDLE) ×2
NS IRRIG 1000ML POUR BTL (IV SOLUTION) ×2 IMPLANT
PACK SHOULDER (CUSTOM PROCEDURE TRAY) ×2 IMPLANT
PAD ARMBOARD 7.5X6 YLW CONV (MISCELLANEOUS) ×4 IMPLANT
PASSER SUT SWANSON 36MM LOOP (INSTRUMENTS) IMPLANT
PIN METAGLENE 2.5 (PIN) ×1 IMPLANT
SLING ARM FOAM STRAP LRG (SOFTGOODS) ×1 IMPLANT
SLING ARM FOAM STRAP XLG (SOFTGOODS) ×1 IMPLANT
SMARTMIX MINI TOWER (MISCELLANEOUS) ×2
SPONGE LAP 18X18 X RAY DECT (DISPOSABLE) ×2 IMPLANT
SPONGE LAP 4X18 X RAY DECT (DISPOSABLE) ×2 IMPLANT
STEM HUMERAL 12MM (Trauma) ×1 IMPLANT
STRIP CLOSURE SKIN 1/2X4 (GAUZE/BANDAGES/DRESSINGS) ×2 IMPLANT
SUCTION FRAZIER TIP 10 FR DISP (SUCTIONS) ×2 IMPLANT
SUT BONE WAX W31G (SUTURE) IMPLANT
SUT FIBERWIRE #2 38 T-5 BLUE (SUTURE) ×6
SUT MNCRL AB 3-0 PS2 18 (SUTURE) ×2 IMPLANT
SUT VIC AB 1 CT1 27 (SUTURE) ×6
SUT VIC AB 1 CT1 27XBRD ANBCTR (SUTURE) ×3 IMPLANT
SUT VIC AB 2-0 CT1 27 (SUTURE) ×4
SUT VIC AB 2-0 CT1 TAPERPNT 27 (SUTURE) ×2 IMPLANT
SUTURE FIBERWR #2 38 T-5 BLUE (SUTURE) ×2 IMPLANT
SYR CONTROL 10ML LL (SYRINGE) ×1 IMPLANT
TOWEL OR 17X24 6PK STRL BLUE (TOWEL DISPOSABLE) ×2 IMPLANT
TOWEL OR 17X26 10 PK STRL BLUE (TOWEL DISPOSABLE) ×2 IMPLANT
TOWER SMARTMIX MINI (MISCELLANEOUS) IMPLANT
WATER STERILE IRR 1000ML POUR (IV SOLUTION) ×2 IMPLANT

## 2012-05-14 NOTE — Anesthesia Preprocedure Evaluation (Signed)
Anesthesia Evaluation  Patient identified by MRN, date of birth, ID band Patient awake    Reviewed: Allergy & Precautions, H&P , NPO status , Patient's Chart, lab work & pertinent test results  Airway Mallampati: II      Dental  (+) Teeth Intact and Dental Advisory Given   Pulmonary  breath sounds clear to auscultation        Cardiovascular Rhythm:Regular Rate:Normal     Neuro/Psych    GI/Hepatic   Endo/Other    Renal/GU      Musculoskeletal   Abdominal (+) + obese,   Peds  Hematology   Anesthesia Other Findings   Reproductive/Obstetrics                           Anesthesia Physical Anesthesia Plan  ASA: III  Anesthesia Plan: General   Post-op Pain Management:    Induction: Intravenous  Airway Management Planned: Oral ETT  Additional Equipment:   Intra-op Plan:   Post-operative Plan: Extubation in OR  Informed Consent: I have reviewed the patients History and Physical, chart, labs and discussed the procedure including the risks, benefits and alternatives for the proposed anesthesia with the patient or authorized representative who has indicated his/her understanding and acceptance.   Dental advisory given  Plan Discussed with: CRNA and Surgeon  Anesthesia Plan Comments: (DJD L. Shoulder Obesity Sleep apnea on CPAP H/O bilat Pulm emboli 2011 on coumadin INR 1.35 on 3/31 Htn Gout  Fibromyalgia  Plan GA will forego block due to risk of resp dysfunction from diaphragmatic paralysis  Roberts Gaudy)        Anesthesia Quick Evaluation

## 2012-05-14 NOTE — Care Management Note (Signed)
CARE MANAGEMENT NOTE 05/14/2012  Patient:  [name], [name]   Account Number:  [id number]  Date Initiated:  05/14/2012  Documentation initiated by:  Ricki Miller  Subjective/Objective Assessment:   78 yr old female s/p left shoulder arthroplasty     Action/Plan:   Patient preoperatively setup with Gen tiva HC, no changes.   Anticipated DC Date:  05/15/2012   Anticipated DC Plan:  [place]  CM consult      Choice offered to / List presented to:          [facility] arranged  HH-1 RN  HH-3 OT      [facility] agency  [facility]   Status of service:  Completed, signed off Medicare Important Message given?   (If response is "NO", the following Medicare IM given date fields will be blank) Date Medicare IM given:   Date Additional Medicare IM given:    Discharge Disposition:  SKILLED NURSING FACILITY  Per UR Regulation:    If discussed at Long Length of Stay Meetings, dates discussed:    Comments:

## 2012-05-14 NOTE — H&P (Signed)
[name]    Chief Complaint: osteoarthritis left shoulder HPI: The patient is a 78 y.o. female with endstage left shoulder arthritis  Past Medical History  Diagnosis Date  . Hypertension   . Hyperlipidemia   . Gout   . Tinnitus   . Acid reflux disease   . Pulmonary embolism     history of a postsurgical pulmonary embolism  . Vertigo   . Crohn disease   . Long term current use of anticoagulant   . GERD (gastroesophageal reflux disease)   . Recurrent upper respiratory infection (URI)     BRONCHITIS FEB 2013--SLIGHT COUGH NON-PRODUCTIVE NOW  . H/O hiatal hernia   . PVC (premature ventricular contraction)     PT STATES HX OF PVC'S ON EKG  . Osteoarthritis     PAIN AND OA LEF T HIP AND BOTH SHOULDERS ARE BONE ON BONE AND PAINFUL  . PONV (postoperative nausea and vomiting)   . Shortness of breath     WITH EXERTION--PT STATES LACK OF EXERCISE AND WEIGHT GAIN  . Obstructive sleep apnea on CPAP   . Sleep apnea     DOES NOT KNOW SETTINGS - USES CPAP WHEN SLEEPING  . Peripheral vascular disease     KNOWN RIGHT INTERNAL CAROTID ARTERY OCCLUSION (NO STROKE)  --40 TO 59% STENOSIS LEFT ICA-FOLLOWED BY DR. EARLY WITH DOPPLER STUDY EVERY 6 MONTHS  . Fibromyalgia     Past Surgical History  Procedure Laterality Date  . Knee surgery  02/19/10    Right total-knee arthroplast  . Femur fracture surgery  02/21/09    Open reduction internal fixation of right periprosthetic  femur fracture utilizing Zimmer cables times fiv  . Hammer toe surgery  10/25/08  . Back surgery  10/29/06    Central and foraminal decompression L3-L4, L4-L5, and L5-S1 with inspection of L4-L5 and L5-S1 disc on the right  . Knee arthroscopy  07/26/05    righ  . Total hip arthroplasty  12/08/02    Osteonics total hip replacement, right.  . Excision morton's neuroma  05/20/00  . Cardiac catheterization    . Cardiac catheterization    . Eye surgery  2009    RIGHT CATARACT EXTRACTION  . Cholecystectomy    . Dilation and  curettage of uterus    . Tonsillectomy    . Total hip arthroplasty  06/17/2011    Procedure: TOTAL HIP ARTHROPLASTY;  Surgeon: Gearlean Alf, MD;  Location: [facility] ORS;  Service: Orthopedics;  Laterality: Left;  . Joint replacement  2009    Right Hip replacement  . Joint replacement  2012    Right knee replacement  . Joint replacement  06/17/11    Left Hip replacement  . Fracture surgery  2011    Right Femur Fx  . Spine surgery    . Abdominal hysterectomy      Family History  Problem Relation Age of Onset  . Heart disease Father     Heart Disease before age 9  . Heart attack Father 24  . Hypertension Father   . Peripheral vascular disease Father     Right  leg amputation  . Heart disease Mother   . Stroke Mother   . Aneurysm Mother   . Hypertension Mother   . Hypertension Brother   . Hypertension Brother   . Diabetes Paternal Grandfather     Social History:  reports that she quit smoking about 26 years ago. She has never used smokeless tobacco. She reports that  drinks alcohol. She reports that she does not use illicit drugs.  Allergies:  Allergies  Allergen Reactions  . Ciprofloxacin     RASH  . Codeine     NAUSEA  . Cymbalta (Duloxetine Hcl)     Nausea VOMITING AND ABDOMINAL PAIN, HEADACHE, JUST ABOUT EVERY SIDE EFFECT THE DRUG HAS  . Nortriptyline     Nightmares  . Penicillins     RASH  & ITCHING   --PT STATES SHE CAN TAKE KEFLEX PO AND IV CEPHALOSPORINS  . Statins     Leg cramps  . Tetanus Toxoids     SWELLING, REDNESS  WHOLE ARM    Medications Prior to Admission  Medication Sig Dispense Refill  . allopurinol (ZYLOPRIM) 300 MG tablet Take 1 tablet by mouth Daily.      [name] Kitchen amLODipine (NORVASC) 5 MG tablet Take 1 tablet by mouth Daily.      . Brimonidine Tartrate-Timolol (COMBIGAN OP) Apply 1 drop to eye 2 (two) times daily. RIGHT EYE      . calcium citrate-vitamin D (CITRACAL+D) 315-200 MG-UNIT per tablet Take 1 tablet by mouth daily.      . CELEBREX 200 MG  capsule Take 200 mg by mouth Daily.      . cholecalciferol (VITAMIN D) 1000 UNITS tablet Take 3,000 Units by mouth daily.      . diphenhydrAMINE (BENADRYL) 25 mg capsule Take 25 mg by mouth at bedtime. For sleep      . fenofibrate 160 MG tablet Take 1 tablet by mouth daily. EVENING      . furosemide (LASIX) 40 MG tablet 1/2 TAB Every other day prn for fluid  30 tablet  12  . HYDROcodone-acetaminophen (NORCO/VICODIN) 5-325 MG per tablet Take 1 tablet by mouth every 6 (six) hours as needed for pain.      . indapamide (LOZOL) 2.5 MG tablet Take 2.5 mg by mouth Daily.      [name] Kitchen ketoconazole (NIZORAL) 2 % cream Apply 1 application topically 2 (two) times daily.       [name] Kitchen loratadine (CLARITIN) 10 MG tablet Take 10 mg by mouth daily as needed for allergies. For allergies      . Magnesium 400 MG CAPS Take 1 capsule by mouth daily.       . metoprolol tartrate (LOPRESSOR) 25 MG tablet Take 1 tablet by mouth Twice daily.      . Multiple Vitamins-Minerals (MULTIVITAMIN WITH MINERALS) tablet Take 1 tablet by mouth daily.      . nitrofurantoin (MACRODANTIN) 50 MG capsule Take 50 mg by mouth at bedtime.      [name] Kitchen omeprazole (PRILOSEC) 40 MG capsule Take 1 capsule by mouth Daily.      . pregabalin (LYRICA) 75 MG capsule Take 75 mg by mouth at bedtime.       [name] Kitchen warfarin (COUMADIN) 2.5 MG tablet Take 2.5 mg by mouth daily.         Physical Exam: left shoulder with painful and restricted motion as noted at recent office visit  Vitals  Temp:  [98.1 F (36.7 C)] 98.1 F (36.7 C) (04/03 0616) Pulse Rate:  [61] 61 (04/03 0616) Resp:  [18] 18 (04/03 0616) BP: (103)/(71) 103/71 mmHg (04/03 0616) SpO2:  [93 %] 93 % (04/03 0616)  Assessment/Plan  Impression: osteoarthritis left shoulder  Plan of Action: Procedure(s): TOTAL SHOULDER ARTHROPLASTY  Jacques Fife M 05/14/2012, 7:32 AM

## 2012-05-14 NOTE — Transfer of Care (Signed)
Immediate Anesthesia Transfer of Care Note  Patient: [name]  Procedure(s) Performed: Procedure(s): TOTAL SHOULDER ARTHROPLASTY (Left)  Patient Location: PACU  Anesthesia Type:General  Level of Consciousness: awake, sedated and patient cooperative  Airway & Oxygen Therapy: Patient Spontanous Breathing and Patient connected to nasal cannula oxygen  Post-op Assessment: Report given to PACU RN, Post -op Vital signs reviewed and stable and Patient moving all extremities  Post vital signs: Reviewed and stable  Complications: No apparent anesthesia complications

## 2012-05-14 NOTE — Progress Notes (Signed)
Hematoma noted on left upper arm.  Roselee Culver PA notified.

## 2012-05-14 NOTE — Progress Notes (Signed)
UR COMPLETED  

## 2012-05-14 NOTE — Anesthesia Postprocedure Evaluation (Signed)
  Anesthesia Post-op Note  Patient: [name]  Procedure(s) Performed: Procedure(s): TOTAL SHOULDER ARTHROPLASTY (Left)  Patient Location: PACU  Anesthesia Type:General  Level of Consciousness: awake, alert  and oriented  Airway and Oxygen Therapy: Patient Spontanous Breathing and Patient connected to nasal cannula oxygen  Post-op Pain: mild  Post-op Assessment: Post-op Vital signs reviewed, Patient's Cardiovascular Status Stable, Respiratory Function Stable, Patent Airway and Pain level controlled  Post-op Vital Signs: stable  Complications: No apparent anesthesia complications

## 2012-05-14 NOTE — Op Note (Signed)
05/14/2012  9:59 AM  PATIENT:   [name]  78 y.o. female  PRE-OPERATIVE DIAGNOSIS:  osteoarthritis left shoulder  POST-OPERATIVE DIAGNOSIS:  same  PROCEDURE:  Left TSA 12 stem 44X18 eccentric head, 44 glenoid  SURGEON:  Kaiser Belluomini, Metta Clines M.D.  ASSISTANTS: [place] pac   ANESTHESIA:   GET + local  EBL: 300  SPECIMEN:  none  Drains: none   PATIENT DISPOSITION:  PACU - hemodynamically stable.    PLAN OF CARE: Admit to inpatient   Dictation# [id number]

## 2012-05-15 DIAGNOSIS — M19019 Primary osteoarthritis, unspecified shoulder: Secondary | ICD-10-CM

## 2012-05-15 MED ORDER — OXYCODONE-ACETAMINOPHEN 5-325 MG PO TABS: 1.0000 | ORAL_TABLET | ORAL | Status: DC | PRN

## 2012-05-15 MED ORDER — METHOCARBAMOL 500 MG PO TABS: 500.0000 mg | ORAL_TABLET | Freq: Three times a day (TID) | ORAL | Status: DC | PRN

## 2012-05-15 NOTE — Op Note (Signed)
[name], [name] NO.:  [id number]  MEDICAL RECORD NO.:  [id number]  LOCATION:  5N22C                        FACILITY:  [facility]  PHYSICIAN:  Metta Clines. Erna Brossard, M.D.  DATE OF BIRTH:  05-Oct-1934  DATE OF PROCEDURE: DATE OF DISCHARGE:                              OPERATIVE REPORT   ADDENDUM:  Jenetta Loges, PAC was used as an Environmental consultant throughout this case, essential for help with positioning of the extremity, management of the retractors, exposure, and in consideration of her morbid obesity, and significantly increased technical difficulty, PA's assistant was essential for help for this case.     Metta Clines. Pranika Finks, M.D.     KMS/MEDQ  D:  05/14/2012  T:  05/15/2012  Job:  [id number]

## 2012-05-15 NOTE — Progress Notes (Signed)
Placed pt on CPAP via pt. Home full face mask. Home setting of 11.0 cm H20.  Pt. Is tolerating well at this time.

## 2012-05-15 NOTE — Op Note (Signed)
NAMEEXIE, CHRISMER NO.:  [id number]  MEDICAL RECORD NO.:  [id number]  LOCATION:  5N22C                        FACILITY:  [facility]  PHYSICIAN:  Metta Clines. Avigail Pilling, M.D.  DATE OF BIRTH:  03/11/1934  DATE OF PROCEDURE:  05/14/2012 DATE OF DISCHARGE:                              OPERATIVE REPORT   PREOPERATIVE DIAGNOSIS:  End-stage left shoulder osteoarthrosis.  POSTOPERATIVE DIAGNOSIS:  End-stage left shoulder osteoarthrosis.  PROCEDURE:  Left total shoulder arthroplasty utilizing a press-fit size 12 [employer] stem with a 44 x 18 eccentric humeral head and a cemented pegged 44 glenoid.  SURGEON:  Metta Clines. Bryker Fletchall, M.D.  Terrence DupontOlivia Mackie A. Shuford, PA-C.  ANESTHESIA:  General endotracheal as well as local in the form of Exparel long-acting local anesthetic.  ESTIMATED BLOOD LOSS:  300 mL.  DRAINS:  None.  HISTORY:  Ms. Colclasure is a 78 year old female who has had chronic progressive increasing left shoulder pain with increasing functional limitations now quite debilitated secondary to pain and restricted mobility.  She is brought to the operating room at this time for planned left total shoulder arthroplasty.  Preoperatively, I counseled Ms. Arreguin on treatment options as well as risks versus benefits thereof.  Possible surgical complications were reviewed with potential for bleeding, infection, neurovascular injury, persistent pain, loss of motion, anesthetic complication, and possible need for additional surgery.  She understands and accepts and agrees to planned procedure.  PROCEDURE IN DETAIL:  After undergoing routine preop evaluation, the patient received prophylactic antibiotics and a decision was made not to perform an interscalene block due to obesity and relatively elevated diaphragms and poor overall respiratory effort.  She did receive prophylactic antibiotics.  Brought to the operating room, placed supine on the operative table, underwent  smooth induction of a general endotracheal anesthesia.  Placed in a beach-chair position and appropriate padding protected.  The left shoulder girdle region was sterilely prepped and draped in standard fashion.  I should mention that extra care was taken in positioning, prepping and draping to accommodate her morbid obesity.  Time-out was called.  An anterior approach to the left shoulder was made through a 15 cm incision along the deltopectoral interval.  Skin flaps were elevated and electrocautery was used for hemostasis.  Dissection carried deeply.  The deltopectoral interval was identified.  The cephalic vein retracted laterally in the deltoid. Extra deep retractors required due to the large amount of subcutaneous fat, ultimately divided the adhesions beneath the subacromial subdeltoid bursal space and also identified to mobilize the conjoined tendon. Reflected and protected this medially.  The biceps tendon was identified, unroofed from the bicipital groove, and tenotomized for later tenodesis.  We then split the rotator interval towards the base of the coracoid and then divided the subscap from its lesser tuberosity insertion and tagged the free margin with a series of grasping #2 FiberWire sutures for later repair.  We were able to then deliver the humeral head through the wound and divided the capsule attachments to the anterior inferior and inferior aspects of the humeral head to allow complete delivery of the humeral head into the wound.  We carefully protected the rotator cuff superiorly  and posteriorly.  Oscillating saw was then used to perform the humeral head resection at approximately 30 degrees of retroversion making her native retroversion and protecting the rotator cuff.  We then used an osteotome to remove the osteophytes which had developed on the anterior inferior and posterior inferior aspects of the humeral head.  Hand reaming of the canal was then performed up to  size 12.  We then broached up to size 12 maintaining appropriate degree of retroversion.  Metal cap was then placed over the cut surface of the proximal humerus.  We then used a series of pitchfork and snake tongue retractors to gain exposure to the glenoid and divided the capsule circumferentially and performed a labral resection, mobilized the subscapularis and gained complete visualization of the glenoid which was markedly eroded.  There was normal version, however. A central guidepin was placed and we reamed with 44 reamer and then placed a central drill hole and peripheral peg holes using the proper guide.  This was then irrigated, dried, we trialed the implant which showed good fit.  Cement was mixed in the appropriate consistency.  We cemented the 44 glenoid into the PEG holes with excellent fit and fixation.  After cement hardened, we then returned our attention to the proximal humerus, where we implanted the size 12 stem with bone graft harvested from the resected humeral head obtaining excellent fit and fixation.  We performed some trial reductions in overall the 44 x 18 eccentric head showed the best soft tissue balance and coverage over the proximal humerus.  The final head was impacted after the [facility] taper was meticulously cleaned.  Final reduction was performed.  We did achieve the 50% translation of the humeral head across the glenoid which we strive forward.  Irrigation was completed.  Hemostasis was obtained.  We then repaired the subscapularis back to the lateral cuff of tissue left at the lesser tuberosity with #2 FiberWires and then also closed the rotator interval with a pair of figure-of-eight #2 FiberWire sutures. We then performed a biceps tenodeses at the upper levels of the pectoralis major using #2 fiber wires.  The deltopectoral interval was then reapproximated with 0 Vicryl.  We infiltrated the combination 20 mL of Exparel and 10 mL of saline mixed liberally  along the skin incision, and then the skin was closed with running intracuticular 3-0 Monocryl. Steri-Strips applied.  Aquacel dressing placed.  Left arm was placed in a sling.  The patient was awakened, extubated, and taken to the recovery room in stable condition.     Metta Clines. Henok Heacock, M.D.     KMS/MEDQ  D:  05/14/2012  T:  05/15/2012  Job:  [id number]

## 2012-05-15 NOTE — Evaluation (Addendum)
Occupational Therapy Evaluation Patient Details Name: [name] MRN: [id number] DOB: 31-Jul-1934 Today's Date: 05/15/2012 Time: 7672-0947 OT Time Calculation (min): 68 min  OT Assessment / Plan / Recommendation Clinical Impression   78 y.o. female s/p left total shoulder arthroplasty. Educated pt and spouse on exercises, sling, and ADLs. Pt requires assistance with exercises and ADLs at home. Vc's required for pt to maintain shoulder precautions. Needs 24 hour assistance. Spoke with pt and spouse about rehab options, but pt adamant to d/c home. Recommend HHOT.      OT Assessment  Patient needs continued OT Services    Follow Up Recommendations  Home health OT    Barriers to Discharge None    Equipment Recommendations  Toilet riser;Other (comment) (grab bars)    Recommendations for Other Services    Frequency  Min 2X/week    Precautions / Restrictions Precautions Precautions: Shoulder Type of Shoulder Precautions: ADL education, sling education, pendulum exercises, elbow, wrist, and hand AROM Precaution Booklet Issued: Yes (comment) Required Braces or Orthoses: Other Brace/Splint (sling) Restrictions Weight Bearing Restrictions: Yes LUE Weight Bearing: Non weight bearing   Pertinent Vitals/Pain Pain 5/10 in shoulder. repositioned    ADL  Toilet Transfer: Min guard Toilet Transfer Method: Sit to Loss adjuster, chartered: Comfort height toilet;Grab bars Toileting - Clothing Manipulation and Hygiene: Minimal assistance Where Assessed - Toileting Clothing Manipulation and Hygiene: Standing ADL Comments: Pt with balance deficits. Recommend grab bars and toilet riser for home.Pt required assistance with self ROM due to pt body habitus.      OT Diagnosis: Acute pain  OT Problem List: Decreased strength;Decreased activity tolerance;Impaired balance (sitting and/or standing);Decreased knowledge of use of DME or AE;Decreased knowledge of precautions;Obesity;Pain;Impaired UE  functional use;Decreased range of motion OT Treatment Interventions: Self-care/ADL training;Therapeutic exercise;DME and/or AE instruction;Patient/family education   OT Goals Acute Rehab OT Goals OT Goal Formulation: With patient and family Time For Goal Achievement: 05/22/12 Potential to Achieve Goals: Good Miscellaneous OT Goals Miscellaneous OT Goal #1: Pt will independently maintain shoulder precautions during ADLs. OT Goal: Miscellaneous Goal #1 - Progress: Goal set today Miscellaneous OT Goal #2: Pt will independently perform home exercise program while maintaining shoulder precautions. OT Goal: Miscellaneous Goal #1 - Progress: Goal set today Miscellaneous OT Goal #3: Caregiver will don/doff sling independently while pt maintains shoulder precautions.  OT Goal: Miscellaneous Goal #1 - Progress: Goal set today  Visit Information  Last OT Received On: 05/15/12 Assistance Needed: +1    Subjective Data      Prior Functioning     Home Living Lives With: Spouse Available Help at Discharge: Family;Available 24 hours/day Bathroom Shower/Tub: Chiropodist: Standard Bathroom Accessibility: Yes Home Adaptive Equipment: Bedside commode/3-in-1 Prior Function Level of Independence: Needs assistance Needs Assistance: Bathing;Dressing;Grooming;Toileting;Meal Prep;Light Housekeeping;Transfers;Gait         Vision/Perception     Cognition  Cognition Overall Cognitive Status: Appears within functional limits for tasks assessed/performed Arousal/Alertness: Awake/alert Orientation Level: Appears intact for tasks assessed Behavior During Session: [facility] [place] for tasks performed    Extremity/Trunk Assessment Right Upper Extremity Assessment RUE ROM/Strength/Tone: Deficits (limited ROM in Right shoulder ) Left Upper Extremity Assessment LUE ROM/Strength/Tone: Deficits;Due to precautions     Mobility Bed Mobility Bed Mobility: Supine to Sit;Sitting - Scoot to Edge  of Bed Supine to Sit: 2: Max assist Sitting - Scoot to [employer] of Bed: 4: Min assist Details for Bed Mobility Assistance: educated husband on how to assist with supine to sit  Transfers Transfers:  Sit to Stand;Stand to Sit Sit to Stand: 4: Min assist;From bed;From toilet Stand to Sit: To bed;To chair/3-in-1;5: Supervision     Exercise Shoulder Exercises Shoulder Flexion: PROM;10 reps;Supine;Left Shoulder Extension: PROM;10 reps;Supine;Left Shoulder ABduction: PROM;10 reps;Supine;Left Shoulder External Rotation: PROM;10 reps;Supine;Left Elbow Flexion: AROM;10 reps;Seated;Left Elbow Extension: AROM;10 reps;Seated;Left Wrist Flexion: AROM;10 reps;Seated;Left Wrist Extension: AROM;10 reps;Seated;Left Digit Composite Flexion: AROM;10 reps;Seated;Left Composite Extension: AROM;10 reps;Seated;Left Donning/doffing sling/immobilizer: Supervision/safety ROM for elbow, wrist and digits of operated UE: Independent Sling wearing schedule (on at all times/off for ADL's): Caregiver independent with task Proper positioning of operated UE when showering: Supervision/safety (max vc's required for pt to avoid moving shoulder.)   Balance Balance Balance Assessed: Yes Dynamic Standing Balance Dynamic Standing - Comments: Pt with balance deficits and uses a cane to ambulate. Minguard assist for ambulation.   End of Session OT - End of Session Equipment Utilized During Treatment: Gait belt;Other (comment) (sling and cane) Activity Tolerance: Patient tolerated treatment well Patient left: in chair;with family/visitor present   GO     Benito Mccreedy OTR/L [phone] 05/15/2012, 2:42 PM

## 2012-05-15 NOTE — Discharge Summary (Signed)
PATIENT ID:      [name]  MRN:     [id number] DOB/AGE:    1935/01/09 / 78 y.o.     DISCHARGE SUMMARY  ADMISSION DATE:    05/14/2012 DISCHARGE DATE:  05/15/2012  ADMISSION DIAGNOSIS: osteoarthritis left shoulder Past Medical History  Diagnosis Date  . Hypertension   . Gout   . Tinnitus   . Acid reflux disease   . Pulmonary embolism 2011    history of a postsurgical pulmonary embolism; "due to immobility; not OR" (05/14/2012)  . Vertigo   . Crohn disease   . Long term current use of anticoagulant   . GERD (gastroesophageal reflux disease)   . Recurrent upper respiratory infection (URI)     BRONCHITIS FEB 2013--SLIGHT COUGH NON-PRODUCTIVE NOW  . H/O hiatal hernia   . PVC (premature ventricular contraction)     PT STATES HX OF PVC'S ON EKG  . PONV (postoperative nausea and vomiting)   . Peripheral vascular disease     KNOWN RIGHT INTERNAL CAROTID ARTERY OCCLUSION (NO STROKE)  --40 TO 59% STENOSIS LEFT ICA-FOLLOWED BY DR. EARLY WITH DOPPLER STUDY EVERY 6 MONTHS  . Fibromyalgia   . High triglycerides   . Exertional shortness of breath   . Chronic bronchitis     "get it once/yr" (05/14/2012)  . Obstructive sleep apnea on CPAP   . Crohn's disease   . History of stomach ulcers 1950's  . Osteoarthritis     PAIN AND OA LEF T HIP AND BOTH SHOULDERS ARE BONE ON BONE AND PAINFUL  . Recurrent UTI (urinary tract infection)     "on daily medicine" (05/14/2012)    DISCHARGE DIAGNOSIS:   Active Problems:   Shoulder arthritis   PROCEDURE: Procedure(s): TOTAL SHOULDER ARTHROPLASTY on 05/14/2012  CONSULTS:     HISTORY:  See H&P in chart.  HOSPITAL COURSE:  [name] is a 78 y.o. admitted on 05/14/2012 with a chief complaint of No chief complaint on file. , and found to have a diagnosis of osteoarthritis left shoulder.  They were brought to the operating room on 05/14/2012 and underwent Procedure(s): TOTAL SHOULDER ARTHROPLASTY.    They were given perioperative antibiotics:  Anti-infectives   Start     Dose/Rate Route Frequency Ordered Stop   05/14/12 1400  ceFAZolin (ANCEF) IVPB 1 g/50 mL premix     1 g 100 mL/hr over 30 Minutes Intravenous Every 6 hours 05/14/12 1302 05/15/12 0256   05/14/12 0716  ceFAZolin (ANCEF) 2-3 GM-% IVPB SOLR    Comments:  MCMILLEN, MIKE: cabinet override      05/14/12 0716 05/14/12 0805   05/14/12 0716  ceFAZolin (ANCEF) 1-5 GM-% IVPB    Comments:  MCMILLEN, MIKE: cabinet override      05/14/12 0716 05/14/12 1929   05/14/12 0600  ceFAZolin (ANCEF) 3 g in dextrose 5 % 50 mL IVPB  Status:  Discontinued     3 g 160 mL/hr over 30 Minutes Intravenous On call to O.R. 05/13/12 1414 05/14/12 1259    .  Patient underwent the above named procedure and tolerated it well. The following day they were hemodynamically stable and pain was controlled on oral analgesics. She did have significant early bruising to her upper arm which was not surprising considering her varicosities encountered at surgery. They were neurovascularly intact to the operative extremity. OT was ordered and worked with patient per protocol. They were medically and orthopaedically stable for discharge on POD 1 .  DIAGNOSTIC STUDIES:  RECENT RADIOGRAPHIC STUDIES :  Dg Chest 2 View  05/11/2012  *RADIOLOGY REPORT*  Clinical Data: Hypertension  CHEST - 2 VIEW  Comparison: May 06, 2011.  Findings: Stable cardiomegaly.  No acute pulmonary disease is noted.  No pneumothorax or pleural effusion is noted.  Bony thorax is intact.  IMPRESSION: No acute cardiopulmonary abnormality seen.   Original Report Authenticated By: Marijo Conception.,  M.D.     RECENT VITAL SIGNS:  Patient Vitals for the past 24 hrs:  BP Temp Temp src Pulse Resp SpO2 Height Weight  05/15/12 0607 157/59 mmHg 98.4 F (36.9 C) Oral 72 16 96 % - -  05/15/12 0018 - - - - - 97 % - -  05/14/12 2214 123/33 mmHg 98 F (36.7 C) Oral 79 17 97 % - -  05/14/12 1800 142/50 mmHg 99.1 F (37.3 C) Oral 70 18 94 % - -   05/14/12 1400 - - - - - - 5' 1.81" (1.57 m) 109.4 kg (241 lb 2.9 oz)  05/14/12 1300 131/51 mmHg 98.3 F (36.8 C) Oral 63 18 - - -  05/14/12 1215 - 97.2 F (36.2 C) - - - - - -  05/14/12 1212 - - - 61 15 95 % - -  05/14/12 1207 136/58 mmHg - - 63 14 95 % - -  05/14/12 1200 - - - 62 14 95 % - -  05/14/12 1152 116/50 mmHg - - 61 15 96 % - -  05/14/12 1145 - - - 61 15 96 % - -  05/14/12 1137 119/54 mmHg - - 59 13 100 % - -  05/14/12 1130 - - - 59 16 95 % - -  05/14/12 1122 118/57 mmHg - - 59 17 97 % - -  05/14/12 1115 - - - 59 15 96 % - -  05/14/12 1107 120/52 mmHg - - 58 17 96 % - -  05/14/12 1100 - - - 57 15 95 % - -  05/14/12 1052 108/72 mmHg - - 56 17 97 % - -  05/14/12 1045 - - - 55 19 97 % - -  05/14/12 1037 124/50 mmHg - - 53 18 100 % - -  05/14/12 1030 - - - 53 18 96 % - -  05/14/12 1022 125/63 mmHg - - 57 19 96 % - -  05/14/12 1021 - - - 58 20 96 % - -  05/14/12 1020 - 98.6 F (37 C) - - - - - -  .  RECENT EKG RESULTS:    Orders placed during the hospital encounter of 05/11/12  . EKG 12-LEAD  . EKG 12-LEAD    DISCHARGE INSTRUCTIONS:   Future Appointments Provider Department Dept Phone   05/22/2012 11:00 AM [facility] [phone]   09/10/2012 2:00 PM [facility] [phone]   09/10/2012 3:00 PM Princess Perna, NP [facility] [phone]      DISCHARGE MEDICATIONS:     Medication List    TAKE these medications       allopurinol 300 MG tablet  Commonly known as:  ZYLOPRIM  Take 1 tablet by mouth Daily.     amLODipine 5 MG tablet  Commonly known as:  NORVASC  Take 1 tablet by mouth Daily.     calcium citrate-vitamin D 315-200 MG-UNIT per tablet  Commonly known as:  CITRACAL+D  Take 1 tablet by mouth daily.  CELEBREX 200 MG capsule  Generic drug:  celecoxib  Take 200 mg by mouth Daily.     cholecalciferol 1000 UNITS tablet   Commonly known as:  VITAMIN D  Take 3,000 Units by mouth daily.     COMBIGAN OP  Apply 1 drop to eye 2 (two) times daily. RIGHT EYE     diphenhydrAMINE 25 mg capsule  Commonly known as:  BENADRYL  Take 25 mg by mouth at bedtime. For sleep     fenofibrate 160 MG tablet  Take 1 tablet by mouth daily. EVENING     furosemide 40 MG tablet  Commonly known as:  LASIX  1/2 TAB Every other day prn for fluid     HYDROcodone-acetaminophen 5-325 MG per tablet  Commonly known as:  NORCO/VICODIN  Take 1 tablet by mouth every 6 (six) hours as needed for pain.     indapamide 2.5 MG tablet  Commonly known as:  LOZOL  Take 2.5 mg by mouth Daily.     ketoconazole 2 % cream  Commonly known as:  NIZORAL  Apply 1 application topically 2 (two) times daily.     loratadine 10 MG tablet  Commonly known as:  CLARITIN  Take 10 mg by mouth daily as needed for allergies. For allergies     Magnesium 400 MG Caps  Take 1 capsule by mouth daily.     methocarbamol 500 MG tablet  Commonly known as:  ROBAXIN  Take 1 tablet (500 mg total) by mouth 3 (three) times daily as needed.     metoprolol tartrate 25 MG tablet  Commonly known as:  LOPRESSOR  Take 1 tablet by mouth Twice daily.     multivitamin with minerals tablet  Take 1 tablet by mouth daily.     nitrofurantoin 50 MG capsule  Commonly known as:  MACRODANTIN  Take 50 mg by mouth at bedtime.     omeprazole 40 MG capsule  Commonly known as:  PRILOSEC  Take 1 capsule by mouth Daily.     oxyCODONE-acetaminophen 5-325 MG per tablet  Commonly known as:  PERCOCET  Take 1-2 tablets by mouth every 4 (four) hours as needed for pain.     pregabalin 75 MG capsule  Commonly known as:  LYRICA  Take 75 mg by mouth at bedtime.     warfarin 2.5 MG tablet  Commonly known as:  COUMADIN  Take 2.5 mg by mouth daily.        FOLLOW UP VISIT:       Follow-up Information   Follow up with SUPPLE,KEVIN M, MD. (call to be seen in 10-14 days)     Contact information:   [address], [address], [address] [place] [address] [phone]       DISCHARGE TA:EWYB with HHOT  DISPOSITION: Good  DISCHARGE CONDITION:  Good   Surabhi Gadea for Dr. Justice Britain 05/15/2012, 9:04 AM

## 2012-05-15 NOTE — Progress Notes (Signed)
Occupational therapy note   05/15/12 1500  OT Visit Information  Last OT Received On 05/15/12  Assistance Needed +1  OT Time Calculation  OT Start Time 1225  OT Stop Time 1312  OT Time Calculation (min) 47 min  Precautions  Precautions Shoulder  Type of Shoulder Precautions ADL education, sling education, pendulum exercises, elbow, wrist, and hand AROM  Precaution Booklet Issued Yes (comment)  Required Braces or Orthoses Other Brace/Splint (sling)  Restrictions  Weight Bearing Restrictions Yes  LUE Weight Bearing NWB  ADL  Equipment Used Other (comment) (sling and cane)  ADL Comments Max vc's for pt to maintain precautions while getting dressed however pt able to teach back instructions to therapist correctly and independently using handouts. Spouse practiced assisting pt with dressing and donning/doffing sling. Spouse able to independently assist pt.  Cognition  Overall Cognitive Status Appears within functional limits for tasks assessed/performed  Arousal/Alertness Awake/alert  Orientation Level Appears intact for tasks assessed  Behavior During Session [facility] for tasks performed  Bed Mobility  Bed Mobility Not assessed  Transfers  Transfers Sit to Stand;Stand to Sit  Sit to Stand From chair/3-in-1;4: Min guard  Stand to Sit 5: Supervision;To chair/3-in-1  Exercises  Exercises Shoulder  Shoulder Instructions  Donning/doffing shirt without moving shoulder Caregiver independent with task  Method for sponge bathing under operated UE Caregiver independent with task  Donning/doffing sling/immobilizer Caregiver independent with task  Correct positioning of sling/immobilizer Caregiver independent with task  ROM for elbow, wrist and digits of operated UE Supervision/safety  Sling wearing schedule (on at all times/off for ADL's) Caregiver independent with task  Proper positioning of operated UE when showering Supervision/safety (max vc's for pt to avoid breaking shoulder  precautions)  Positioning of UE while sleeping Supervision/safety  Shoulder Exercises  Neck Flexion AROM;10 reps;Seated  Neck Extension AROM;10 reps;Seated  Neck Lateral Flexion - Right AROM;10 reps;Seated  Neck Lateral Flexion - Left AROM;10 reps;Seated  Pendulum Exercise Other (comment) (pt unable to perform pendulum exercises due to back and knee)  OT - End of Session  Equipment Utilized During Treatment Other (comment) (sling and cane)  Activity Tolerance Patient tolerated treatment well  Patient left in chair;with family/visitor present  Nurse Communication Patient requests pain meds  OT Assessment/Plan  OT Frequency Min 2X/week  Follow Up Recommendations Home health OT  OT Equipment Toilet riser;Other (comment) (grab bars)  Acute Rehab OT Goals  OT Goal Formulation With patient/family  Time For Goal Achievement 05/22/12  Potential to Achieve Goals Good  Miscellaneous OT Goals  Miscellaneous OT Goal #1 Pt will independently maintain shoulder precautions during ADLs.  OT Goal: Miscellaneous Goal #1 - Progress Progressing toward goals  Miscellaneous OT Goal #2 Pt will independently perform home exercise program while maintaining shoulder precautions.  OT Goal: Miscellaneous Goal #2 - Progress Progressing toward goals  Miscellaneous OT Goal #3 Caregiver will don/doff sling independently while pt maintains shoulder precautions.    OT Goal: Miscellaneous Goal #3 - Progress Met  OT General Charges  $OT Visit 1 Procedure  OT Treatments  $Self Care/Home Management  38-52 mins  Charonda Hefter L. Ronnett Pullin, OTR/L Pager [phone]

## 2012-05-16 DIAGNOSIS — Z96619 Presence of unspecified artificial shoulder joint: Secondary | ICD-10-CM | POA: Diagnosis not present

## 2012-05-16 DIAGNOSIS — M169 Osteoarthritis of hip, unspecified: Secondary | ICD-10-CM | POA: Diagnosis not present

## 2012-05-16 DIAGNOSIS — Z471 Aftercare following joint replacement surgery: Secondary | ICD-10-CM | POA: Diagnosis not present

## 2012-05-16 DIAGNOSIS — Z5181 Encounter for therapeutic drug level monitoring: Secondary | ICD-10-CM | POA: Diagnosis not present

## 2012-05-16 DIAGNOSIS — IMO0001 Reserved for inherently not codable concepts without codable children: Secondary | ICD-10-CM | POA: Diagnosis not present

## 2012-05-16 DIAGNOSIS — Z7901 Long term (current) use of anticoagulants: Secondary | ICD-10-CM | POA: Diagnosis not present

## 2012-05-16 DIAGNOSIS — M1710 Unilateral primary osteoarthritis, unspecified knee: Secondary | ICD-10-CM | POA: Diagnosis not present

## 2012-05-18 ENCOUNTER — Encounter (HOSPITAL_COMMUNITY): Payer: Self-pay | Admitting: Orthopedic Surgery

## 2012-05-18 DIAGNOSIS — Z7901 Long term (current) use of anticoagulants: Secondary | ICD-10-CM | POA: Diagnosis not present

## 2012-05-18 DIAGNOSIS — Z5181 Encounter for therapeutic drug level monitoring: Secondary | ICD-10-CM | POA: Diagnosis not present

## 2012-05-18 DIAGNOSIS — Z471 Aftercare following joint replacement surgery: Secondary | ICD-10-CM | POA: Diagnosis not present

## 2012-05-18 DIAGNOSIS — IMO0001 Reserved for inherently not codable concepts without codable children: Secondary | ICD-10-CM | POA: Diagnosis not present

## 2012-05-18 DIAGNOSIS — Z96619 Presence of unspecified artificial shoulder joint: Secondary | ICD-10-CM | POA: Diagnosis not present

## 2012-05-20 DIAGNOSIS — Z5181 Encounter for therapeutic drug level monitoring: Secondary | ICD-10-CM | POA: Diagnosis not present

## 2012-05-20 DIAGNOSIS — Z471 Aftercare following joint replacement surgery: Secondary | ICD-10-CM | POA: Diagnosis not present

## 2012-05-20 DIAGNOSIS — IMO0001 Reserved for inherently not codable concepts without codable children: Secondary | ICD-10-CM | POA: Diagnosis not present

## 2012-05-20 DIAGNOSIS — Z96619 Presence of unspecified artificial shoulder joint: Secondary | ICD-10-CM | POA: Diagnosis not present

## 2012-05-20 DIAGNOSIS — Z7901 Long term (current) use of anticoagulants: Secondary | ICD-10-CM | POA: Diagnosis not present

## 2012-05-22 ENCOUNTER — Ambulatory Visit (INDEPENDENT_AMBULATORY_CARE_PROVIDER_SITE_OTHER): Payer: Medicare Other | Admitting: Internal Medicine

## 2012-05-22 DIAGNOSIS — Z471 Aftercare following joint replacement surgery: Secondary | ICD-10-CM | POA: Diagnosis not present

## 2012-05-22 DIAGNOSIS — I2699 Other pulmonary embolism without acute cor pulmonale: Secondary | ICD-10-CM

## 2012-05-22 DIAGNOSIS — Z96619 Presence of unspecified artificial shoulder joint: Secondary | ICD-10-CM | POA: Diagnosis not present

## 2012-05-22 DIAGNOSIS — IMO0001 Reserved for inherently not codable concepts without codable children: Secondary | ICD-10-CM | POA: Diagnosis not present

## 2012-05-22 DIAGNOSIS — Z7901 Long term (current) use of anticoagulants: Secondary | ICD-10-CM | POA: Diagnosis not present

## 2012-05-22 DIAGNOSIS — Z5181 Encounter for therapeutic drug level monitoring: Secondary | ICD-10-CM | POA: Diagnosis not present

## 2012-05-24 ENCOUNTER — Emergency Department (HOSPITAL_COMMUNITY): Payer: Medicare Other

## 2012-05-24 ENCOUNTER — Inpatient Hospital Stay (HOSPITAL_COMMUNITY)
Admission: EM | Admit: 2012-05-24 | Discharge: 2012-05-28 | DRG: 175 | Disposition: A | Discharge status: Home Health | Payer: Medicare Other | Attending: Internal Medicine | Admitting: Internal Medicine

## 2012-05-24 ENCOUNTER — Encounter (HOSPITAL_COMMUNITY): Payer: Self-pay

## 2012-05-24 DIAGNOSIS — G4733 Obstructive sleep apnea (adult) (pediatric): Secondary | ICD-10-CM | POA: Diagnosis present

## 2012-05-24 DIAGNOSIS — Z6841 Body Mass Index (BMI) 40.0 and over, adult: Secondary | ICD-10-CM | POA: Diagnosis not present

## 2012-05-24 DIAGNOSIS — D62 Acute posthemorrhagic anemia: Secondary | ICD-10-CM | POA: Diagnosis not present

## 2012-05-24 DIAGNOSIS — R079 Chest pain, unspecified: Secondary | ICD-10-CM | POA: Diagnosis not present

## 2012-05-24 DIAGNOSIS — R0602 Shortness of breath: Secondary | ICD-10-CM | POA: Diagnosis not present

## 2012-05-24 DIAGNOSIS — I1310 Hypertensive heart and chronic kidney disease without heart failure, with stage 1 through stage 4 chronic kidney disease, or unspecified chronic kidney disease: Secondary | ICD-10-CM | POA: Diagnosis present

## 2012-05-24 DIAGNOSIS — K5090 Crohn's disease, unspecified, without complications: Secondary | ICD-10-CM | POA: Diagnosis present

## 2012-05-24 DIAGNOSIS — J9600 Acute respiratory failure, unspecified whether with hypoxia or hypercapnia: Secondary | ICD-10-CM | POA: Diagnosis present

## 2012-05-24 DIAGNOSIS — E6609 Other obesity due to excess calories: Secondary | ICD-10-CM

## 2012-05-24 DIAGNOSIS — I2699 Other pulmonary embolism without acute cor pulmonale: Principal | ICD-10-CM

## 2012-05-24 DIAGNOSIS — I119 Hypertensive heart disease without heart failure: Secondary | ICD-10-CM

## 2012-05-24 DIAGNOSIS — I658 Occlusion and stenosis of other precerebral arteries: Secondary | ICD-10-CM | POA: Diagnosis present

## 2012-05-24 DIAGNOSIS — Z5181 Encounter for therapeutic drug level monitoring: Secondary | ICD-10-CM | POA: Diagnosis not present

## 2012-05-24 DIAGNOSIS — E785 Hyperlipidemia, unspecified: Secondary | ICD-10-CM

## 2012-05-24 DIAGNOSIS — J9601 Acute respiratory failure with hypoxia: Secondary | ICD-10-CM

## 2012-05-24 DIAGNOSIS — D696 Thrombocytopenia, unspecified: Secondary | ICD-10-CM

## 2012-05-24 DIAGNOSIS — I509 Heart failure, unspecified: Secondary | ICD-10-CM | POA: Diagnosis not present

## 2012-05-24 DIAGNOSIS — Z87891 Personal history of nicotine dependence: Secondary | ICD-10-CM

## 2012-05-24 DIAGNOSIS — R0902 Hypoxemia: Secondary | ICD-10-CM

## 2012-05-24 DIAGNOSIS — IMO0001 Reserved for inherently not codable concepts without codable children: Secondary | ICD-10-CM | POA: Diagnosis present

## 2012-05-24 DIAGNOSIS — I5033 Acute on chronic diastolic (congestive) heart failure: Secondary | ICD-10-CM | POA: Diagnosis present

## 2012-05-24 DIAGNOSIS — I6529 Occlusion and stenosis of unspecified carotid artery: Secondary | ICD-10-CM | POA: Diagnosis present

## 2012-05-24 DIAGNOSIS — E669 Obesity, unspecified: Secondary | ICD-10-CM | POA: Diagnosis present

## 2012-05-24 DIAGNOSIS — M169 Osteoarthritis of hip, unspecified: Secondary | ICD-10-CM

## 2012-05-24 DIAGNOSIS — M109 Gout, unspecified: Secondary | ICD-10-CM

## 2012-05-24 DIAGNOSIS — M1990 Unspecified osteoarthritis, unspecified site: Secondary | ICD-10-CM | POA: Diagnosis present

## 2012-05-24 DIAGNOSIS — R7989 Other specified abnormal findings of blood chemistry: Secondary | ICD-10-CM | POA: Insufficient documentation

## 2012-05-24 DIAGNOSIS — D649 Anemia, unspecified: Secondary | ICD-10-CM

## 2012-05-24 DIAGNOSIS — K219 Gastro-esophageal reflux disease without esophagitis: Secondary | ICD-10-CM | POA: Diagnosis present

## 2012-05-24 DIAGNOSIS — N183 Chronic kidney disease, stage 3 unspecified: Secondary | ICD-10-CM | POA: Diagnosis present

## 2012-05-24 DIAGNOSIS — I319 Disease of pericardium, unspecified: Secondary | ICD-10-CM | POA: Diagnosis not present

## 2012-05-24 DIAGNOSIS — M797 Fibromyalgia: Secondary | ICD-10-CM

## 2012-05-24 DIAGNOSIS — R062 Wheezing: Secondary | ICD-10-CM

## 2012-05-24 DIAGNOSIS — I6521 Occlusion and stenosis of right carotid artery: Secondary | ICD-10-CM

## 2012-05-24 DIAGNOSIS — M19019 Primary osteoarthritis, unspecified shoulder: Secondary | ICD-10-CM

## 2012-05-24 DIAGNOSIS — E1129 Type 2 diabetes mellitus with other diabetic kidney complication: Secondary | ICD-10-CM | POA: Diagnosis present

## 2012-05-24 LAB — COMPREHENSIVE METABOLIC PANEL
ALT: 17 U/L (ref 0–35)
AST: 19 U/L (ref 0–37)
Alkaline Phosphatase: 93 U/L (ref 39–117)
CO2: 29 mEq/L (ref 19–32)
Calcium: 8.6 mg/dL (ref 8.4–10.5)
GFR calc non Af Amer: 60 mL/min — ABNORMAL LOW (ref >90)
Glucose, Bld: 125 mg/dL — ABNORMAL HIGH (ref 70–99)
Potassium: 3.6 mEq/L (ref 3.5–5.1)
Sodium: 143 mEq/L (ref 135–145)

## 2012-05-24 LAB — CBC
HCT: 32.7 % — ABNORMAL LOW (ref 36.0–46.0)
Hemoglobin: 10.3 g/dL — ABNORMAL LOW (ref 12.0–15.0)
MCH: 26.9 pg (ref 26.0–34.0)
MCHC: 31.5 g/dL (ref 30.0–36.0)
MCV: 85.4 fL (ref 78.0–100.0)

## 2012-05-24 LAB — APTT: aPTT: 32 seconds (ref 24–37)

## 2012-05-24 LAB — TROPONIN I: Troponin I: <0.3 ng/mL (ref <0.30)

## 2012-05-24 LAB — POCT I-STAT TROPONIN I: Troponin i, poc: 0 ng/mL (ref 0.00–0.08)

## 2012-05-24 MED ORDER — POTASSIUM CHLORIDE CRYS ER 20 MEQ PO TBCR
20.0000 meq | EXTENDED_RELEASE_TABLET | Freq: Every day | ORAL | Status: DC
  Administered 2012-05-24: 20 meq via ORAL
  Filled 2012-05-24 (×2): qty 1

## 2012-05-24 MED ORDER — ENOXAPARIN SODIUM 120 MG/0.8ML ~~LOC~~ SOLN
1.0000 mg/kg | Freq: Two times a day (BID) | SUBCUTANEOUS | Status: DC
  Administered 2012-05-25 – 2012-05-26 (×3): 110 mg via SUBCUTANEOUS
  Filled 2012-05-24 (×5): qty 0.8

## 2012-05-24 MED ORDER — PATIENT'S GUIDE TO USING COUMADIN BOOK
Freq: Once | Status: AC
  Administered 2012-05-25: 08:00:00
  Filled 2012-05-24: qty 1

## 2012-05-24 MED ORDER — PANTOPRAZOLE SODIUM 40 MG PO TBEC
80.0000 mg | DELAYED_RELEASE_TABLET | Freq: Every day | ORAL | Status: DC
  Administered 2012-05-25 – 2012-05-28 (×4): 80 mg via ORAL
  Filled 2012-05-24 (×3): qty 2
  Filled 2012-05-24 (×2): qty 1

## 2012-05-24 MED ORDER — VITAMIN D3 25 MCG (1000 UNIT) PO TABS
3000.0000 [IU] | ORAL_TABLET | Freq: Every day | ORAL | Status: DC
  Administered 2012-05-25 – 2012-05-28 (×4): 3000 [IU] via ORAL
  Filled 2012-05-24 (×4): qty 3

## 2012-05-24 MED ORDER — DIPHENHYDRAMINE HCL 25 MG PO CAPS: 25.0000 mg | ORAL_CAPSULE | Freq: Every evening | ORAL | Status: DC | PRN

## 2012-05-24 MED ORDER — METHOCARBAMOL 500 MG PO TABS
500.0000 mg | ORAL_TABLET | Freq: Three times a day (TID) | ORAL | Status: DC | PRN
  Filled 2012-05-24: qty 1

## 2012-05-24 MED ORDER — METOPROLOL TARTRATE 25 MG PO TABS
25.0000 mg | ORAL_TABLET | Freq: Two times a day (BID) | ORAL | Status: DC
  Administered 2012-05-24 – 2012-05-28 (×8): 25 mg via ORAL
  Filled 2012-05-24 (×9): qty 1

## 2012-05-24 MED ORDER — WARFARIN SODIUM 5 MG PO TABS
5.0000 mg | ORAL_TABLET | Freq: Once | ORAL | Status: AC
  Administered 2012-05-24: 5 mg via ORAL
  Filled 2012-05-24: qty 1

## 2012-05-24 MED ORDER — ENOXAPARIN SODIUM 100 MG/ML ~~LOC~~ SOLN
1.0000 mg/kg | Freq: Once | SUBCUTANEOUS | Status: DC
  Filled 2012-05-24: qty 2

## 2012-05-24 MED ORDER — LORATADINE 10 MG PO TABS
10.0000 mg | ORAL_TABLET | Freq: Every day | ORAL | Status: DC | PRN
  Filled 2012-05-24: qty 1

## 2012-05-24 MED ORDER — SENNOSIDES-DOCUSATE SODIUM 8.6-50 MG PO TABS
2.0000 | ORAL_TABLET | Freq: Every evening | ORAL | Status: DC | PRN
  Filled 2012-05-24: qty 2

## 2012-05-24 MED ORDER — ACETAMINOPHEN 650 MG RE SUPP: 650.0000 mg | Freq: Four times a day (QID) | RECTAL | Status: DC | PRN

## 2012-05-24 MED ORDER — MAGNESIUM CITRATE PO SOLN
1.0000 | Freq: Once | ORAL | Status: AC | PRN
  Filled 2012-05-24: qty 296

## 2012-05-24 MED ORDER — WARFARIN VIDEO: Freq: Once | Status: DC

## 2012-05-24 MED ORDER — ACETAMINOPHEN 325 MG PO TABS: 650.0000 mg | ORAL_TABLET | Freq: Four times a day (QID) | ORAL | Status: DC | PRN

## 2012-05-24 MED ORDER — ALUM & MAG HYDROXIDE-SIMETH 200-200-20 MG/5ML PO SUSP: 30.0000 mL | Freq: Four times a day (QID) | ORAL | Status: DC | PRN

## 2012-05-24 MED ORDER — OXYCODONE-ACETAMINOPHEN 5-325 MG PO TABS
1.0000 | ORAL_TABLET | ORAL | Status: DC | PRN
  Administered 2012-05-25 (×2): 2 via ORAL
  Administered 2012-05-25 – 2012-05-26 (×2): 1 via ORAL
  Administered 2012-05-26: 2 via ORAL
  Administered 2012-05-26: 1 via ORAL
  Administered 2012-05-27: 2 via ORAL
  Administered 2012-05-27 – 2012-05-28 (×4): 1 via ORAL
  Filled 2012-05-24 (×2): qty 2
  Filled 2012-05-24 (×3): qty 1
  Filled 2012-05-24: qty 2
  Filled 2012-05-24 (×2): qty 1
  Filled 2012-05-24: qty 2
  Filled 2012-05-24 (×2): qty 1

## 2012-05-24 MED ORDER — ONDANSETRON HCL 4 MG PO TABS: 4.0000 mg | ORAL_TABLET | Freq: Four times a day (QID) | ORAL | Status: DC | PRN

## 2012-05-24 MED ORDER — FUROSEMIDE 10 MG/ML IJ SOLN
40.0000 mg | Freq: Once | INTRAMUSCULAR | Status: AC
  Administered 2012-05-24: 40 mg via INTRAVENOUS
  Filled 2012-05-24: qty 4

## 2012-05-24 MED ORDER — AMLODIPINE BESYLATE 5 MG PO TABS
5.0000 mg | ORAL_TABLET | Freq: Every day | ORAL | Status: DC
  Administered 2012-05-25 – 2012-05-28 (×4): 5 mg via ORAL
  Filled 2012-05-24 (×4): qty 1

## 2012-05-24 MED ORDER — NITROFURANTOIN MACROCRYSTAL 50 MG PO CAPS
50.0000 mg | ORAL_CAPSULE | Freq: Every day | ORAL | Status: DC
  Administered 2012-05-24 – 2012-05-27 (×4): 50 mg via ORAL
  Filled 2012-05-24 (×5): qty 1

## 2012-05-24 MED ORDER — KETOCONAZOLE 2 % EX CREA
1.0000 "application " | TOPICAL_CREAM | Freq: Two times a day (BID) | CUTANEOUS | Status: DC
  Filled 2012-05-24 (×2): qty 15

## 2012-05-24 MED ORDER — IOHEXOL 350 MG/ML SOLN
100.0000 mL | Freq: Once | INTRAVENOUS | Status: AC | PRN
  Administered 2012-05-24: 100 mL via INTRAVENOUS

## 2012-05-24 MED ORDER — CELECOXIB 200 MG PO CAPS
200.0000 mg | ORAL_CAPSULE | Freq: Every day | ORAL | Status: DC
  Administered 2012-05-25 – 2012-05-28 (×4): 200 mg via ORAL
  Filled 2012-05-24 (×4): qty 1

## 2012-05-24 MED ORDER — ENOXAPARIN SODIUM 120 MG/0.8ML ~~LOC~~ SOLN
110.0000 mg | Freq: Once | SUBCUTANEOUS | Status: AC
  Administered 2012-05-24: 110 mg via SUBCUTANEOUS
  Filled 2012-05-24: qty 0.8

## 2012-05-24 MED ORDER — CALCIUM CARBONATE-VITAMIN D 500-200 MG-UNIT PO TABS
1.0000 | ORAL_TABLET | Freq: Every day | ORAL | Status: DC
  Administered 2012-05-25 – 2012-05-28 (×4): 1 via ORAL
  Filled 2012-05-24 (×4): qty 1

## 2012-05-24 MED ORDER — FENOFIBRATE 160 MG PO TABS
160.0000 mg | ORAL_TABLET | Freq: Every evening | ORAL | Status: DC
  Administered 2012-05-25 – 2012-05-27 (×3): 160 mg via ORAL
  Filled 2012-05-24 (×5): qty 1

## 2012-05-24 MED ORDER — SODIUM CHLORIDE 0.9 % IJ SOLN
3.0000 mL | Freq: Two times a day (BID) | INTRAMUSCULAR | Status: DC
  Administered 2012-05-26 – 2012-05-27 (×2): 3 mL via INTRAVENOUS

## 2012-05-24 MED ORDER — SODIUM CHLORIDE 0.9 % IJ SOLN
3.0000 mL | Freq: Two times a day (BID) | INTRAMUSCULAR | Status: DC
  Administered 2012-05-26: 3 mL via INTRAVENOUS

## 2012-05-24 MED ORDER — FUROSEMIDE 10 MG/ML IJ SOLN
40.0000 mg | Freq: Two times a day (BID) | INTRAMUSCULAR | Status: DC
  Administered 2012-05-25 – 2012-05-26 (×3): 40 mg via INTRAVENOUS
  Filled 2012-05-24 (×5): qty 4

## 2012-05-24 MED ORDER — MULTI-VITAMIN/MINERALS PO TABS: 1.0000 | ORAL_TABLET | Freq: Every day | ORAL | Status: DC

## 2012-05-24 MED ORDER — MAGNESIUM 400 MG PO CAPS: 400.0000 mg | ORAL_CAPSULE | Freq: Every day | ORAL | Status: DC

## 2012-05-24 MED ORDER — ALLOPURINOL 300 MG PO TABS
300.0000 mg | ORAL_TABLET | Freq: Every day | ORAL | Status: DC
  Administered 2012-05-25 – 2012-05-28 (×4): 300 mg via ORAL
  Filled 2012-05-24 (×4): qty 1

## 2012-05-24 MED ORDER — BRIMONIDINE TARTRATE-TIMOLOL 0.2-0.5 % OP SOLN: 1.0000 [drp] | Freq: Two times a day (BID) | OPHTHALMIC | Status: DC

## 2012-05-24 MED ORDER — BISACODYL 5 MG PO TBEC
10.0000 mg | DELAYED_RELEASE_TABLET | Freq: Every day | ORAL | Status: DC | PRN
  Filled 2012-05-24: qty 2

## 2012-05-24 MED ORDER — CALCIUM CITRATE-VITAMIN D 315-200 MG-UNIT PO TABS: 1.0000 | ORAL_TABLET | Freq: Every day | ORAL | Status: DC

## 2012-05-24 MED ORDER — ADULT MULTIVITAMIN W/MINERALS CH
1.0000 | ORAL_TABLET | Freq: Every day | ORAL | Status: DC
  Administered 2012-05-25 – 2012-05-28 (×4): 1 via ORAL
  Filled 2012-05-24 (×4): qty 1

## 2012-05-24 MED ORDER — ENOXAPARIN (LOVENOX) PATIENT EDUCATION KIT
PACK | Freq: Once | Status: DC
  Filled 2012-05-24: qty 1

## 2012-05-24 MED ORDER — SODIUM CHLORIDE 0.9 % IV SOLN: INTRAVENOUS | Status: DC

## 2012-05-24 MED ORDER — INDAPAMIDE 2.5 MG PO TABS
2.5000 mg | ORAL_TABLET | Freq: Every day | ORAL | Status: DC
  Administered 2012-05-25 – 2012-05-28 (×4): 2.5 mg via ORAL
  Filled 2012-05-24 (×4): qty 1

## 2012-05-24 MED ORDER — WARFARIN - PHARMACIST DOSING INPATIENT: Freq: Every day | Status: DC

## 2012-05-24 MED ORDER — SODIUM CHLORIDE 0.9 % IV SOLN: 250.0000 mL | INTRAVENOUS | Status: DC | PRN

## 2012-05-24 MED ORDER — SODIUM CHLORIDE 0.9 % IJ SOLN: 3.0000 mL | INTRAMUSCULAR | Status: DC | PRN

## 2012-05-24 MED ORDER — BRIMONIDINE TARTRATE 0.2 % OP SOLN
1.0000 [drp] | Freq: Two times a day (BID) | OPHTHALMIC | Status: DC
  Administered 2012-05-24 – 2012-05-28 (×8): 1 [drp] via OPHTHALMIC
  Filled 2012-05-24: qty 5

## 2012-05-24 MED ORDER — MAGNESIUM OXIDE 400 (241.3 MG) MG PO TABS
400.0000 mg | ORAL_TABLET | Freq: Every day | ORAL | Status: DC
  Administered 2012-05-25 – 2012-05-28 (×4): 400 mg via ORAL
  Filled 2012-05-24 (×4): qty 1

## 2012-05-24 MED ORDER — TIMOLOL MALEATE 0.5 % OP SOLN
1.0000 [drp] | Freq: Two times a day (BID) | OPHTHALMIC | Status: DC
  Administered 2012-05-24 – 2012-05-28 (×8): 1 [drp] via OPHTHALMIC
  Filled 2012-05-24: qty 5

## 2012-05-24 MED ORDER — ONDANSETRON HCL 4 MG/2ML IJ SOLN: 4.0000 mg | Freq: Four times a day (QID) | INTRAMUSCULAR | Status: DC | PRN

## 2012-05-24 NOTE — H&P (Signed)
[facility] History and Physical  [name] NOB:[id number] DOB: 1935-01-03 DOA: 05/24/2012  Referring physician:  Fredia Sorrow PCP:  Horatio Pel, MD  Dr. Ouida Sills, Rheumatology Dr. Mare Ferrari, Cards, [facility]   Chief Complaint:  CP and SOB  HPI:  The patient is a 78 y.o. year-old female with history of HTN, HLD, ICA atherosclerosis, OSA, Crohn's disease, who recently underwent left total shoulder arthroplasty by Dr. Onnie Graham on 05/14/12 who presents with SOB and CP.  The patient was last at their baseline health except for her shoulder discomfort about 4 days ago.  She developed some increased SOB and chest tightness so she took some additional lasix, which helped some for the next few days, however, today, she felt much worse.  She initially attributed her symptoms to lyrica, so she stopped taking it on Friday.  She has had a nonproductive, hacky cough and she has had some slight wheezing.  Last night, she woke up and her right rib cage in the mid-back felt "like it had a catch."  She felt like she needed to reposition, which did not help much.  This morning, she developed a pain in the left breast and nipple that radiated to the left rib cage, again "like a catch" that was intermittent.  Pain lasted for only a few seconds 3/10.  Denies tight pressure.  She felt flushed and warm last night, but denies diaphoresis and nausea with the chest pain.  She has generally felt unwell.    LEE has been getting progressively worse over the last several weeks, which she attributed to the lyrica, which started 3 weeks ago.  She has been using the TED hose and increasing her lasix.  + PND worse over the last 10 days.  Used 2 pillows at night until last night and last night she had to sit up to breath.     In the ER, she was hypoxic to the mid-80s.  Her labs demonstrated stable anemia and thrombocytopenia, elevated pro-BNP.  CT chest demonstrated acute on chronic bilateral PE:  Bilateral lower lobes,  right middle lobe, right upper lobe, and left upper lobe with "patchy areas of consolidation in the RUL, RML, LLL.  Stable pulmonary LN.     Review of Systems:  Denies weight loss or gain, changes to hearing and vision.  Denies rhinorrhea, sinus congestion, sore throat.  Denies  palpitations.  + SOB, wheezing, cough.  Denies nausea, vomiting, constipation, diarrhea.  Denies dysuria, frequency, urgency, polyuria, polydipsia.  Denies hematemesis, blood in stools, melena, abnormal bruising or bleeding.  Denies lymphadenopathy.   Pain in left shoulder post-operatively.  Denies skin rash or ulcer.  Progressive lower extremity edema.  Denies focal numbness, weakness, slurred speech, confusion, facial droop.  Denies anxiety and depression.    Past Medical History  Diagnosis Date  . Hypertension   . Gout   . Tinnitus   . Acid reflux disease   . Pulmonary embolism 2011    history of a postsurgical pulmonary embolism; "due to immobility; not OR" (05/14/2012)  . Vertigo   . Crohn disease   . Long term current use of anticoagulant   . GERD (gastroesophageal reflux disease)   . Recurrent upper respiratory infection (URI)     BRONCHITIS FEB 2013--SLIGHT COUGH NON-PRODUCTIVE NOW  . H/O hiatal hernia   . PVC (premature ventricular contraction)     PT STATES HX OF PVC'S ON EKG  . PONV (postoperative nausea and vomiting)   . Peripheral vascular disease  KNOWN RIGHT INTERNAL CAROTID ARTERY OCCLUSION (NO STROKE)  --40 TO 59% STENOSIS LEFT ICA-FOLLOWED BY DR. EARLY WITH DOPPLER STUDY EVERY 6 MONTHS  . Fibromyalgia   . High triglycerides   . Exertional shortness of breath   . Chronic bronchitis     "get it once/yr" (05/14/2012)  . Obstructive sleep apnea on CPAP   . Crohn's disease   . History of stomach ulcers 1950's  . Osteoarthritis     PAIN AND OA LEF T HIP AND BOTH SHOULDERS ARE BONE ON BONE AND PAINFUL  . Recurrent UTI (urinary tract infection)     "on daily medicine" (05/14/2012)   Past Surgical  History  Procedure Laterality Date  . Femur fracture surgery Right 02/21/09    Open reduction internal fixation of right periprosthetic  femur fracture utilizing Zimmer cables times fiv  . Hammer toe surgery Left 10/25/08    "toe next to big to" (05/14/2012)  . Back surgery  10/29/06    Central and foraminal decompression L3-L4, L4-L5, and L5-S1 with inspection of L4-L5 and L5-S1 disc on the right  . Knee arthroscopy Right 07/26/05  . Total hip arthroplasty Right 12/08/02    Osteonics total hip replacement  . Excision morton's neuroma Right 05/20/00    "foot" (05/14/2012)  . Total hip arthroplasty  06/17/2011    Procedure: TOTAL HIP ARTHROPLASTY;  Surgeon: Gearlean Alf, MD;  Location: [facility];  Service: Orthopedics;  Laterality: Left;  . Joint replacement  2009    Right Hip replacement  . Joint replacement  2012    Right knee replacement  . Joint replacement  06/17/11    Left Hip replacement  . Fracture surgery  2011    Right Femur Fx  . Total shoulder arthroplasty Left 05/14/2012  . Tonsillectomy and adenoidectomy  1958?  [name] Kitchen Appendectomy  1950's  . Cholecystectomy  ~ 1988  . Vaginal hysterectomy  1971  . Dilation and curettage of uterus      "several; from miscarriages" (05/14/2012)  . Decompressive lumbar laminectomy level 3  ~ 2002  . Replacement total knee Right 02/19/2010  . Cardiac catheterization  1970's    "maybe 2" (05/14/2012)  . Cardiac catheterization    . Cataract extraction w/ intraocular lens implant Right 2009  . Total shoulder arthroplasty Left 05/14/2012    Procedure: TOTAL SHOULDER ARTHROPLASTY;  Surgeon: Marin Shutter, MD;  Location: [facility];  Service: Orthopedics;  Laterality: Left;   Social History:  reports that she quit smoking about 26 years ago. Her smoking use included Cigarettes. She has a 7.5 pack-year smoking history. She has never used smokeless tobacco. She reports that  drinks alcohol. She reports that she does not use illicit drugs.  [facility] arranged  CPAP.  Lives in a one level house with two steps two enter house with her husband.  Uses a cane.  Has had OT and an RN for her shoulder service.  [employer].    Allergies  Allergen Reactions  . Ciprofloxacin     RASH  . Codeine     NAUSEA  . Cymbalta (Duloxetine Hcl)     Nausea VOMITING AND ABDOMINAL PAIN, HEADACHE, JUST ABOUT EVERY SIDE EFFECT THE DRUG HAS  . Nortriptyline     Nightmares  . Penicillins     RASH  & ITCHING   --PT STATES SHE CAN TAKE KEFLEX PO AND IV CEPHALOSPORINS  . Statins     Leg cramps  . Tetanus Toxoids  SWELLING, REDNESS  WHOLE ARM    Family History  Problem Relation Age of Onset  . Heart disease Father     Heart Disease before age 5  . Heart attack Father 65  . Hypertension Father   . Peripheral vascular disease Father     Right  leg amputation  . Heart disease Mother   . Stroke Mother   . Aneurysm Mother   . Hypertension Mother   . Hypertension Brother   . Hypertension Brother   . Diabetes Paternal Grandfather      Prior to Admission medications   Medication Sig Start Date End Date Taking? Authorizing Provider  allopurinol (ZYLOPRIM) 300 MG tablet Take 300 mg by mouth Daily.  05/10/10  Yes Historical Provider, MD  amLODipine (NORVASC) 5 MG tablet Take 5 mg by mouth Daily.  06/08/10  Yes Historical Provider, MD  Brimonidine Tartrate-Timolol (COMBIGAN OP) Place 1 drop into the right eye 2 (two) times daily. RIGHT EYE   Yes Historical Provider, MD  calcium citrate-vitamin D (CITRACAL+D) 315-200 MG-UNIT per tablet Take 1 tablet by mouth daily.   Yes Historical Provider, MD  CELEBREX 200 MG capsule Take 200 mg by mouth Daily. 08/24/11  Yes Historical Provider, MD  cholecalciferol (VITAMIN D) 1000 UNITS tablet Take 3,000 Units by mouth daily.   Yes Historical Provider, MD  diphenhydrAMINE (BENADRYL) 25 mg capsule Take 25 mg by mouth at bedtime. For sleep   Yes Historical Provider, MD  fenofibrate 160 MG tablet Take 160 mg by mouth every evening.  05/10/10   Yes Historical Provider, MD  furosemide (LASIX) 40 MG tablet Take 20 mg by mouth daily as needed (for fluid retention).   Yes Historical Provider, MD  HYDROcodone-acetaminophen (NORCO/VICODIN) 5-325 MG per tablet Take 1 tablet by mouth every 6 (six) hours as needed for pain.   Yes Historical Provider, MD  indapamide (LOZOL) 2.5 MG tablet Take 2.5 mg by mouth daily.  10/06/10  Yes Historical Provider, MD  ketoconazole (NIZORAL) 2 % cream Apply 1 application topically 2 (two) times daily as needed.  09/26/11  Yes Historical Provider, MD  loratadine (CLARITIN) 10 MG tablet Take 10 mg by mouth daily as needed for allergies. For allergies   Yes Historical Provider, MD  Magnesium 400 MG CAPS Take 400 mg by mouth daily.  02/19/11  Yes Historical Provider, MD  methocarbamol (ROBAXIN) 500 MG tablet Take 1 tablet (500 mg total) by mouth 3 (three) times daily as needed. 05/15/12  Yes Tracy Shuford, PA-C  metoprolol tartrate (LOPRESSOR) 25 MG tablet Take 25 mg by mouth 2 (two) times daily.  05/10/10  Yes Historical Provider, MD  Multiple Vitamins-Minerals (MULTIVITAMIN WITH MINERALS) tablet Take 1 tablet by mouth daily.   Yes Historical Provider, MD  nitrofurantoin (MACRODANTIN) 50 MG capsule Take 50 mg by mouth at bedtime.   Yes Historical Provider, MD  omeprazole (PRILOSEC) 40 MG capsule Take 40 mg by mouth daily.  06/08/10  Yes Historical Provider, MD  oxyCODONE-acetaminophen (PERCOCET) 5-325 MG per tablet Take 1-2 tablets by mouth every 4 (four) hours as needed for pain. 05/15/12  Yes Tracy Shuford, PA-C  warfarin (COUMADIN) 2.5 MG tablet Take 2.5 mg by mouth every evening.    Yes Historical Provider, MD   Physical Exam: Filed Vitals:   05/24/12 1621 05/24/12 1745 05/24/12 1900 05/24/12 1945  BP: 150/69 128/56 132/59   Pulse:  88 88 89  Temp:      TempSrc:      Resp: 22 21  24 26  Weight:   109.317 kg (241 lb)   SpO2: 99% 96% 96% 94%     General:  CF, mild respiratory distress with mild SCM retractions and  tachypnea to the mid-20s.    Eyes:  PERRL, anicteric, non-injected.  ENT:  Nares clear.  OP clear, non-erythematous without plaques or exudates.  MMM.  Neck:  Supple without TM or JVD.    Lymph:  No cervical, supraclavicular, or submandibular LAD.    Cardiovascular:  RRR, normal S1, S2, without m/r/g.  2+ pulses, warm extremities  Respiratory:  Full high pitched expiratory wheeze bilaterally with diminished BS at the bases and faint rales heard L>R.  Abdomen:  NABS.  Soft, ND/NT.    Skin:  No rashes or focal lesions.  Musculoskeletal:  Normal bulk and tone, left shoulder with bandages c/d/i, able to wiggle fingers, arm in sling.  No LE edema.  Psychiatric:  A & O x 4.  Appropriate affect.  Neurologic:  CN 3-12 intact.  5/5 strength (LUE not tested).  Sensation intact.    Labs on Admission:  Basic Metabolic Panel:  Recent Labs Lab 05/24/12 1457  NA 143  K 3.6  CL 104  CO2 29  GLUCOSE 125*  BUN 21  CREATININE 0.91  CALCIUM 8.6   Liver Function Tests:  Recent Labs Lab 05/24/12 1457  AST 19  ALT 17  ALKPHOS 93  BILITOT 0.5  PROT 6.5  ALBUMIN 2.9*   No results found for this basename: LIPASE, AMYLASE,  in the last 168 hours No results found for this basename: AMMONIA,  in the last 168 hours CBC:  Recent Labs Lab 05/24/12 1457  WBC 9.4  HGB 10.3*  HCT 32.7*  MCV 85.4  PLT 121*   Cardiac Enzymes: No results found for this basename: CKTOTAL, CKMB, CKMBINDEX, TROPONINI,  in the last 168 hours  BNP (last 3 results)  Recent Labs  05/24/12 1457  PROBNP 1433.0*   CBG: No results found for this basename: GLUCAP,  in the last 168 hours  Radiological Exams on Admission: Ct Angio Chest Pe W/cm &/or Wo Cm  05/24/2012  *RADIOLOGY REPORT*  Clinical Data: Shortness of breath and chest pain.  CT ANGIOGRAPHY CHEST  Technique:  Multidetector CT imaging of the chest using the standard protocol during bolus administration of intravenous contrast. Multiplanar  reconstructed images including MIPs were obtained and reviewed to evaluate the vascular anatomy.  Contrast: 163m OMNIPAQUE IOHEXOL 350 MG/ML SOLN  Comparison: CT of the chest dated 02/23/2009 and chest x-ray dated 05/24/2012  Findings: There are multiple bilateral pulmonary emboli including both lower lobes, the right middle lobe, in the right upper lobe. There also emboli in the left upper lung.  In addition, the patient has patchy areas of lung consolidation in the right upper lobe and in the right middle lobe. There is also a small peripheral posterior infiltrate at the left base.  These could represent areas of pulmonary infarction.   There is a small benign-appearing intrapulmonary lymph node seen on image number 46 of series 5, unchanged since the prior exam.  There is borderline cardiomegaly.  No acute osseous abnormality. Adenopathy.  IMPRESSION: Multiple bilateral pulmonary emboli. These are acute.  The patient does have a history of prior emboli in the same areas.  Small areas of infiltrate or infarction in the right lung.   Original Report Authenticated By: JLorriane Shire M.D.    Dg Chest Portable 1 View  05/24/2012  *RADIOLOGY REPORT*  Clinical  Data: Shortness of breath, chest pain  PORTABLE CHEST - 1 VIEW  Comparison: 05/11/2012  Findings: Cardiomegaly with mild interstitial edema.  Mild patchy left lower lobe opacity, likely atelectasis. No pleural effusion or pneumothorax.  Left shoulder arthroplasty.  IMPRESSION: Cardiomegaly with mild interstitial edema.   Original Report Authenticated By: Julian Hy, M.D.     EKG: Independently reviewed. NSR.  Stable T-wave inversions in the inferolateral leads, however, the previously noted ST segment depressions in V4-V6 appear somewhat deeper on today's exam.    Assessment/Plan Principal Problem:   Acute pulmonary embolism Active Problems:   Other pulmonary embolism and infarction   Benign hypertensive heart disease without heart failure    Fibromyalgia   Dyslipidemia   Gout   Exogenous obesity   Right carotid artery occlusion   Shoulder arthritis   Acute respiratory failure with hypoxia   Wheezing   Acute on chronic diastolic congestive heart failure   Normocytic anemia   Thrombocytopenia   Inadequate anticoagulation   Elevated brain natriuretic peptide (BNP) level  Pulmonary embolism, acute on chronic with resultant acute hypoxic respiratory failure.  Elevated BNP, however, hemodynamically stable and recent surgery preclude tpa.   -  Observation telemetry -  Cycle troponins -  ECHO -  Lovenox bridge and teaching (has used lovenox previously) -  Continue coumadin per pharmacy -  Walking pulse ox for set up home oxygen  Possible infiltrates on CT likely represent pulmonary infarcts.  No fevers or leukocytosis to suggest true pneumonia, and areas are in distribution which matches the PE.   -  If she develops fever, consider BCx and antibiotics for CAP (recent procedure was < 24 hour stay)  Wheezing, has been progressive along with lower extremity edema for last several weeks (likely preceded PE) and BNP elevated.  Hx of grade 1 diastolic dysfunction with preserved EF 2011 ECHO.  No hx of asthma or COPD or suggestion of emphysema on CT.   Likely acute on chronic diastolic heart failure -  Foley overnight to be removed in AM b/c patient so dyspneic with exertion -  Lasix 74m IV once  -  Potassium 234m BID  Left shoulder, recent surgery -  Continue sling -  Ice packs as needed -  Continue pain medication  HTN/HLD:  BP stable -  Continue BP medications and statin for now (hold parameters on BP meds)  Right ICA occlusion and partial left ICA occlusion: -  Continue warfarin    OSA:  Stable. Continue CPAP 11, no oxygen previously   GERD and PUD:  Stable.  continue PPI Crohns:  Stable.  Not on immunosuppresion  Arthritis:  Stable.  Continue pain medications CKD stage 2/3:  Stable.  Minimize nephrotoxins and renally  dose medications when needed  Normocytic anemia, chronic, stable.  Defer to PCP, trend hgb in setting of increased A/C. Thrombocytopenia, chronic, stable.  Defer to PCP, trend in setting of increased A/C  Diet:  Healthy heart Access:  PIV IVF:  none Proph:  lovenox bridge to coumadin  Code Status:  DNR except would consider central line and pressors "If I were awake."   Family Communication: spoke with patient and her husband Disposition Plan: admit to telemetry  Time spent: 60 min  SHJanece [facility] Pager [phone] 7PM-7AM, please contact night-coverage [email or web address] Password [email or web address]/13/2014, 7:57 PM

## 2012-05-24 NOTE — Progress Notes (Signed)
05/24/12 2341  BiPAP/CPAP/SIPAP  BiPAP/CPAP/SIPAP Pt Type Adult  Mask Type Full face mask  Mask Size Medium  IPAP 11 cmH20  EPAP 11 cmH2O  Flow Rate 3 lpm  BiPAP/CPAP/SIPAP CPAP  Patient Home Equipment No  Auto Titrate No  BiPAP/CPAP /SiPAP Vitals  Pulse Rate 84  Resp 20  SpO2 96 %  Patient placed on CPAP per home settings, Full Face Mask with 3L O2 bleed in. She tolerates it very well at this time.

## 2012-05-24 NOTE — ED Notes (Signed)
Pt. C/o increasing SOB and chest pain. States started when began taking lyrica x16 days ago and stopped x2 days ago but symptoms have continued to get worse. Pt. 84% upon EMS arrival, put on non-rebreather. Hx of bilateral PE.

## 2012-05-24 NOTE — Progress Notes (Signed)
ANTICOAGULATION CONSULT NOTE - Initial Consult  Pharmacy Consult for Coumadin/lovenox Indication: pulmonary embolus  Allergies  Allergen Reactions  . Ciprofloxacin     RASH  . Codeine     NAUSEA  . Cymbalta (Duloxetine Hcl)     Nausea VOMITING AND ABDOMINAL PAIN, HEADACHE, JUST ABOUT EVERY SIDE EFFECT THE DRUG HAS  . Nortriptyline     Nightmares  . Penicillins     RASH  & ITCHING   --PT STATES SHE CAN TAKE KEFLEX PO AND IV CEPHALOSPORINS  . Statins     Leg cramps  . Tetanus Toxoids     SWELLING, REDNESS  WHOLE ARM    Patient Measurements: Weight: 241 lb (109.317 kg)  Vital Signs: Temp: 98.9 F (37.2 C) (04/13 1359) Temp src: Oral (04/13 1359) BP: 134/75 mmHg (04/13 2000) Pulse Rate: 87 (04/13 2000)  Labs:  Recent Labs  05/22/12 05/24/12 1457  HGB  --  10.3*  HCT  --  32.7*  PLT  --  121*  APTT  --  32  LABPROT  --  17.0*  INR 1.2 1.42  CREATININE  --  0.91    The CrCl is unknown because both a height and weight (above a minimum accepted value) are required for this calculation.   Medical History: Past Medical History  Diagnosis Date  . Hypertension   . Gout   . Tinnitus   . Acid reflux disease   . Pulmonary embolism 2011    history of a postsurgical pulmonary embolism; "due to immobility; not OR" (05/14/2012)  . Vertigo   . Crohn disease   . Long term current use of anticoagulant   . GERD (gastroesophageal reflux disease)   . Recurrent upper respiratory infection (URI)     BRONCHITIS FEB 2013--SLIGHT COUGH NON-PRODUCTIVE NOW  . H/O hiatal hernia   . PVC (premature ventricular contraction)     PT STATES HX OF PVC'S ON EKG  . PONV (postoperative nausea and vomiting)   . Peripheral vascular disease     KNOWN RIGHT INTERNAL CAROTID ARTERY OCCLUSION (NO STROKE)  --40 TO 59% STENOSIS LEFT ICA-FOLLOWED BY DR. EARLY WITH DOPPLER STUDY EVERY 6 MONTHS  . Fibromyalgia   . High triglycerides   . Exertional shortness of breath   . Chronic bronchitis    "get it once/yr" (05/14/2012)  . Obstructive sleep apnea on CPAP   . Crohn's disease   . History of stomach ulcers 1950's  . Osteoarthritis     PAIN AND OA LEF T HIP AND BOTH SHOULDERS ARE BONE ON BONE AND PAINFUL  . Recurrent UTI (urinary tract infection)     "on daily medicine" (05/14/2012)    Assessment: 28 YOF with hx of PE, s/p left total shoulder arthroplasty on 4/3, presented with CP and SOB CTA positive for B/L PE. Patient is on coumadin PTA with subtherapeutic INR on admission. Lovenox 110 mg was given in the ED at ~2130, pharmacy is consulted to manage lovenox bridge to coumadin. Scr 0.91, est. crcl 60-70 ml/min. INR 1.42, hgb 10.3, plt 121.  Coumadin PTA dose (per Med History): 2.25m po daily, last dose 4/12  Goal of Therapy:  INR 2-3 Monitor platelets by anticoagulation protocol: Yes   Plan:  Lovenox 110 mg sq q12hrs next dose tomorrow at 9am Coumadin 530mpo x 1 tonight Daily PT/INR Cbc every 3 days Coumadin book and video Coumadin education with pharmacist  MeMaryanna ShapePharmD, BCPS  Clinical Pharmacist  Pager: [phone]   05/24/2012,9:37 PM

## 2012-05-24 NOTE — ED Notes (Signed)
Per EMS, pt c/o SOB and intermittent sharp chest pain x2 days. Also has edema in bilateral feet.

## 2012-05-24 NOTE — ED Provider Notes (Signed)
History     CSN: [id number]  Arrival date & time 05/24/12  1351   First MD Initiated Contact with Patient 05/24/12 [phone]      Chief Complaint  Patient presents with  . Shortness of Breath  . Chest Pain    (Consider location/radiation/quality/duration/timing/severity/associated sxs/prior treatment) The history is provided by the patient and the EMS personnel.   78 year old female with 3 day history of the shortness of breath worse with exertion particularly in the past 2 days. Patient has a prior history of pulmonary embolism. We had recent left shoulder surgery. Patient is on Coumadin. Patient has had intermittent chest pain with it right side the ribs lower and left side rib area. EMS reported that she was hypoxic on room air down to 84%. Here in the emergency department room air sats were down to 89-88%. Patient primary care doctor is Dr. Concha Pyo and cardiologist is Dr. Mare Ferrari.  Past Medical History  Diagnosis Date  . Hypertension   . Gout   . Tinnitus   . Acid reflux disease   . Pulmonary embolism 2011    history of a postsurgical pulmonary embolism; "due to immobility; not OR" (05/14/2012)  . Vertigo   . Crohn disease   . Long term current use of anticoagulant   . GERD (gastroesophageal reflux disease)   . Recurrent upper respiratory infection (URI)     BRONCHITIS FEB 2013--SLIGHT COUGH NON-PRODUCTIVE NOW  . H/O hiatal hernia   . PVC (premature ventricular contraction)     PT STATES HX OF PVC'S ON EKG  . PONV (postoperative nausea and vomiting)   . Peripheral vascular disease     KNOWN RIGHT INTERNAL CAROTID ARTERY OCCLUSION (NO STROKE)  --40 TO 59% STENOSIS LEFT ICA-FOLLOWED BY DR. EARLY WITH DOPPLER STUDY EVERY 6 MONTHS  . Fibromyalgia   . High triglycerides   . Exertional shortness of breath   . Chronic bronchitis     "get it once/yr" (05/14/2012)  . Obstructive sleep apnea on CPAP   . Crohn's disease   . History of stomach ulcers 1950's  . Osteoarthritis     PAIN  AND OA LEF T HIP AND BOTH SHOULDERS ARE BONE ON BONE AND PAINFUL  . Recurrent UTI (urinary tract infection)     "on daily medicine" (05/14/2012)    Past Surgical History  Procedure Laterality Date  . Femur fracture surgery Right 02/21/09    Open reduction internal fixation of right periprosthetic  femur fracture utilizing Zimmer cables times fiv  . Hammer toe surgery Left 10/25/08    "toe next to big to" (05/14/2012)  . Back surgery  10/29/06    Central and foraminal decompression L3-L4, L4-L5, and L5-S1 with inspection of L4-L5 and L5-S1 disc on the right  . Knee arthroscopy Right 07/26/05  . Total hip arthroplasty Right 12/08/02    Osteonics total hip replacement  . Excision morton's neuroma Right 05/20/00    "foot" (05/14/2012)  . Total hip arthroplasty  06/17/2011    Procedure: TOTAL HIP ARTHROPLASTY;  Surgeon: Gearlean Alf, MD;  Location: [facility];  Service: Orthopedics;  Laterality: Left;  . Joint replacement  2009    Right Hip replacement  . Joint replacement  2012    Right knee replacement  . Joint replacement  06/17/11    Left Hip replacement  . Fracture surgery  2011    Right Femur Fx  . Total shoulder arthroplasty Left 05/14/2012  . Tonsillectomy and adenoidectomy  1958?  [name] Kitchen Appendectomy  1950's  . Cholecystectomy  ~ 1988  . Vaginal hysterectomy  1971  . Dilation and curettage of uterus      "several; from miscarriages" (05/14/2012)  . Decompressive lumbar laminectomy level 3  ~ 2002  . Replacement total knee Right 02/19/2010  . Cardiac catheterization  1970's    "maybe 2" (05/14/2012)  . Cardiac catheterization    . Cataract extraction w/ intraocular lens implant Right 2009  . Total shoulder arthroplasty Left 05/14/2012    Procedure: TOTAL SHOULDER ARTHROPLASTY;  Surgeon: Marin Shutter, MD;  Location: [facility];  Service: Orthopedics;  Laterality: Left;    Family History  Problem Relation Age of Onset  . Heart disease Father     Heart Disease before age 51  . Heart attack Father 109   . Hypertension Father   . Peripheral vascular disease Father     Right  leg amputation  . Heart disease Mother   . Stroke Mother   . Aneurysm Mother   . Hypertension Mother   . Hypertension Brother   . Hypertension Brother   . Diabetes Paternal Grandfather     History  Substance Use Topics  . Smoking status: Former Smoker -- 0.50 packs/day for 15 years    Types: Cigarettes    Quit date: 02/11/1986  . Smokeless tobacco: Never Used  . Alcohol Use: Yes     Comment: 05/14/2012 "have 2-3 drinks/yr"    OB History   Grav Para Term Preterm Abortions TAB SAB Ect Mult Living                  Review of Systems  Constitutional: Negative for fever.  HENT: Negative for congestion.   Eyes: Negative for visual disturbance.  Respiratory: Positive for shortness of breath.   Cardiovascular: Positive for chest pain.  Gastrointestinal: Negative for abdominal pain.  Genitourinary: Negative for dysuria.  Neurological: Negative for headaches.  Hematological: Bruises/bleeds easily.  Psychiatric/Behavioral: Negative for confusion.    Allergies  Ciprofloxacin; Codeine; Cymbalta; Nortriptyline; Penicillins; Statins; and Tetanus toxoids  Home Medications   Current Outpatient Rx  Name  Route  Sig  Dispense  Refill  . allopurinol (ZYLOPRIM) 300 MG tablet   Oral   Take 300 mg by mouth Daily.          [name] Kitchen amLODipine (NORVASC) 5 MG tablet   Oral   Take 5 mg by mouth Daily.          . Brimonidine Tartrate-Timolol (COMBIGAN OP)   Right Eye   Place 1 drop into the right eye 2 (two) times daily. RIGHT EYE         . calcium citrate-vitamin D (CITRACAL+D) 315-200 MG-UNIT per tablet   Oral   Take 1 tablet by mouth daily.         . CELEBREX 200 MG capsule   Oral   Take 200 mg by mouth Daily.         . cholecalciferol (VITAMIN D) 1000 UNITS tablet   Oral   Take 3,000 Units by mouth daily.         . diphenhydrAMINE (BENADRYL) 25 mg capsule   Oral   Take 25 mg by mouth at  bedtime. For sleep         . fenofibrate 160 MG tablet   Oral   Take 160 mg by mouth every evening.          . furosemide (LASIX) 40 MG tablet   Oral   Take 20 mg  by mouth daily as needed (for fluid retention).         [name] Kitchen HYDROcodone-acetaminophen (NORCO/VICODIN) 5-325 MG per tablet   Oral   Take 1 tablet by mouth every 6 (six) hours as needed for pain.         . indapamide (LOZOL) 2.5 MG tablet   Oral   Take 2.5 mg by mouth daily.            been on it for many years.   [name] Kitchen ketoconazole (NIZORAL) 2 % cream   Topical   Apply 1 application topically 2 (two) times daily as needed.          . loratadine (CLARITIN) 10 MG tablet   Oral   Take 10 mg by mouth daily as needed for allergies. For allergies         . Magnesium 400 MG CAPS   Oral   Take 400 mg by mouth daily.          . methocarbamol (ROBAXIN) 500 MG tablet   Oral   Take 1 tablet (500 mg total) by mouth 3 (three) times daily as needed.   30 tablet   1   . metoprolol tartrate (LOPRESSOR) 25 MG tablet   Oral   Take 25 mg by mouth 2 (two) times daily.          . Multiple Vitamins-Minerals (MULTIVITAMIN WITH MINERALS) tablet   Oral   Take 1 tablet by mouth daily.         . nitrofurantoin (MACRODANTIN) 50 MG capsule   Oral   Take 50 mg by mouth at bedtime.         [name] Kitchen omeprazole (PRILOSEC) 40 MG capsule   Oral   Take 40 mg by mouth daily.          [name] Kitchen oxyCODONE-acetaminophen (PERCOCET) 5-325 MG per tablet   Oral   Take 1-2 tablets by mouth every 4 (four) hours as needed for pain.   40 tablet   0   . warfarin (COUMADIN) 2.5 MG tablet   Oral   Take 2.5 mg by mouth every evening.            BP 128/56  Pulse 88  Temp(Src) 98.9 F (37.2 C) (Oral)  Resp 21  SpO2 96%  Physical Exam  Nursing note and vitals reviewed. Constitutional: She is oriented to person, place, and time. She appears well-developed and well-nourished. No distress.  HENT:  Head: Normocephalic and atraumatic.   Mouth/Throat: Oropharynx is clear and moist.  Eyes: Conjunctivae and EOM are normal. Pupils are equal, round, and reactive to light.  Neck: Normal range of motion. Neck supple.  Cardiovascular: Normal rate, regular rhythm and normal heart sounds.   No murmur heard. Pulmonary/Chest: Breath sounds normal.  Tachypnic   Abdominal: Soft. Bowel sounds are normal. There is no tenderness.  Musculoskeletal:  Left shoulder arm in a sling.  Neurological: She is alert and oriented to person, place, and time. No cranial nerve deficit. She exhibits normal muscle tone. Coordination normal.  Skin: Skin is warm. No rash noted.    ED Course  Procedures (including critical care time)  Labs Reviewed  PRO B NATRIURETIC PEPTIDE - Abnormal; Notable for the following:    Pro B Natriuretic peptide (BNP) 1433.0 (*)    All other components within normal limits  CBC - Abnormal; Notable for the following:    RBC 3.83 (*)    Hemoglobin 10.3 (*)    HCT 32.7 (*)  RDW 16.8 (*)    Platelets 121 (*)    All other components within normal limits  COMPREHENSIVE METABOLIC PANEL - Abnormal; Notable for the following:    Glucose, Bld 125 (*)    Albumin 2.9 (*)    GFR calc non Af Amer 60 (*)    GFR calc Af Amer 70 (*)    All other components within normal limits  PROTIME-INR - Abnormal; Notable for the following:    Prothrombin Time 17.0 (*)    All other components within normal limits  APTT  POCT I-STAT TROPONIN I   Ct Angio Chest Pe W/cm &/or Wo Cm  05/24/2012  *RADIOLOGY REPORT*  Clinical Data: Shortness of breath and chest pain.  CT ANGIOGRAPHY CHEST  Technique:  Multidetector CT imaging of the chest using the standard protocol during bolus administration of intravenous contrast. Multiplanar reconstructed images including MIPs were obtained and reviewed to evaluate the vascular anatomy.  Contrast: 170m OMNIPAQUE IOHEXOL 350 MG/ML SOLN  Comparison: CT of the chest dated 02/23/2009 and chest x-ray dated  05/24/2012  Findings: There are multiple bilateral pulmonary emboli including both lower lobes, the right middle lobe, in the right upper lobe. There also emboli in the left upper lung.  In addition, the patient has patchy areas of lung consolidation in the right upper lobe and in the right middle lobe. There is also a small peripheral posterior infiltrate at the left base.  These could represent areas of pulmonary infarction.   There is a small benign-appearing intrapulmonary lymph node seen on image number 46 of series 5, unchanged since the prior exam.  There is borderline cardiomegaly.  No acute osseous abnormality. Adenopathy.  IMPRESSION: Multiple bilateral pulmonary emboli. These are acute.  The patient does have a history of prior emboli in the same areas.  Small areas of infiltrate or infarction in the right lung.   Original Report Authenticated By: JLorriane Shire M.D.    Dg Chest Portable 1 View  05/24/2012  *RADIOLOGY REPORT*  Clinical Data: Shortness of breath, chest pain  PORTABLE CHEST - 1 VIEW  Comparison: 05/11/2012  Findings: Cardiomegaly with mild interstitial edema.  Mild patchy left lower lobe opacity, likely atelectasis. No pleural effusion or pneumothorax.  Left shoulder arthroplasty.  IMPRESSION: Cardiomegaly with mild interstitial edema.   Original Report Authenticated By: SJulian Hy M.D.     Date: 05/24/2012  Rate: 80  Rhythm: normal sinus rhythm  QRS Axis: normal  Intervals: normal  ST/T Wave abnormalities: nonspecific T wave changes, ST depressions laterally and early repolarization  Conduction Disutrbances:none  Narrative Interpretation:   Old EKG Reviewed: unchanged No significant change in EKG compared to 02/20/2009 actually appears to be some improvement on ST segment depression inferiorly.  Results for orders placed during the hospital encounter of 05/24/12  PRO B NATRIURETIC PEPTIDE      Result Value Range   Pro B Natriuretic peptide (BNP) 1433.0 (*) 0 - 450  pg/mL  CBC      Result Value Range   WBC 9.4  4.0 - 10.5 K/uL   RBC 3.83 (*) 3.87 - 5.11 MIL/uL   Hemoglobin 10.3 (*) 12.0 - 15.0 g/dL   HCT 32.7 (*) 36.0 - 46.0 %   MCV 85.4  78.0 - 100.0 fL   MCH 26.9  26.0 - 34.0 pg   MCHC 31.5  30.0 - 36.0 g/dL   RDW 16.8 (*) 11.5 - 15.5 %   Platelets 121 (*) 150 - 400 K/uL  COMPREHENSIVE METABOLIC  PANEL      Result Value Range   Sodium 143  135 - 145 mEq/L   Potassium 3.6  3.5 - 5.1 mEq/L   Chloride 104  96 - 112 mEq/L   CO2 29  19 - 32 mEq/L   Glucose, Bld 125 (*) 70 - 99 mg/dL   BUN 21  6 - 23 mg/dL   Creatinine, Ser 0.91  0.50 - 1.10 mg/dL   Calcium 8.6  8.4 - 10.5 mg/dL   Total Protein 6.5  6.0 - 8.3 g/dL   Albumin 2.9 (*) 3.5 - 5.2 g/dL   AST 19  0 - 37 U/L   ALT 17  0 - 35 U/L   Alkaline Phosphatase 93  39 - 117 U/L   Total Bilirubin 0.5  0.3 - 1.2 mg/dL   GFR calc non Af Amer 60 (*) >90 mL/min   GFR calc Af Amer 70 (*) >90 mL/min  PROTIME-INR      Result Value Range   Prothrombin Time 17.0 (*) 11.6 - 15.2 seconds   INR 1.42  0.00 - 1.49  APTT      Result Value Range   aPTT 32  24 - 37 seconds  POCT I-STAT TROPONIN I      Result Value Range   Troponin i, poc 0.00  0.00 - 0.08 ng/mL   Comment 3              1. Pulmonary emboli   2. Hypoxia       MDM  Patient with 3 day history of shortness of breath worse in the past 2 days. Patient's had pulmonary embolism past. Recently had left shoulder surgery. Patient was hypoxia on room air sats were good down to 88%. EMS reported her sats: Down to 84%. Chest x-ray was negative CT angios was done to rule out recurrent pulmonary embolism. It confirms bilateral new emboli. Patient is on Coumadin but currently her levels are subtherapeutic. Primary care doctor is Dr. Shelia Media. Discussed with hospitalist team they will admit to telemetry temporary admit orders done for observation status. Patient started Lovenox.        Mervin Kung, MD 05/24/12 1902

## 2012-05-25 DIAGNOSIS — Z5181 Encounter for therapeutic drug level monitoring: Secondary | ICD-10-CM

## 2012-05-25 DIAGNOSIS — I319 Disease of pericardium, unspecified: Secondary | ICD-10-CM

## 2012-05-25 DIAGNOSIS — Z7901 Long term (current) use of anticoagulants: Secondary | ICD-10-CM

## 2012-05-25 LAB — BASIC METABOLIC PANEL
Calcium: 8.8 mg/dL (ref 8.4–10.5)
GFR calc Af Amer: 68 mL/min — ABNORMAL LOW (ref >90)
GFR calc non Af Amer: 59 mL/min — ABNORMAL LOW (ref >90)
Glucose, Bld: 147 mg/dL — ABNORMAL HIGH (ref 70–99)
Sodium: 142 mEq/L (ref 135–145)

## 2012-05-25 LAB — CBC
MCH: 27.5 pg (ref 26.0–34.0)
MCHC: 32.3 g/dL (ref 30.0–36.0)
Platelets: 132 10*3/uL — ABNORMAL LOW (ref 150–400)
RBC: 3.82 MIL/uL — ABNORMAL LOW (ref 3.87–5.11)

## 2012-05-25 LAB — PROTIME-INR: Prothrombin Time: 17.9 seconds — ABNORMAL HIGH (ref 11.6–15.2)

## 2012-05-25 LAB — TROPONIN I: Troponin I: <0.3 ng/mL (ref <0.30)

## 2012-05-25 MED ORDER — SODIUM CHLORIDE 0.9 % IV SOLN: 250.0000 mL | INTRAVENOUS | Status: DC | PRN

## 2012-05-25 MED ORDER — POTASSIUM CHLORIDE CRYS ER 20 MEQ PO TBCR
40.0000 meq | EXTENDED_RELEASE_TABLET | Freq: Two times a day (BID) | ORAL | Status: AC
  Administered 2012-05-25 (×2): 40 meq via ORAL
  Filled 2012-05-25: qty 1
  Filled 2012-05-25: qty 2

## 2012-05-25 MED ORDER — WARFARIN SODIUM 5 MG PO TABS
5.0000 mg | ORAL_TABLET | Freq: Once | ORAL | Status: AC
  Administered 2012-05-25: 5 mg via ORAL
  Filled 2012-05-25: qty 1

## 2012-05-25 MED ORDER — LEVALBUTEROL HCL 0.63 MG/3ML IN NEBU
0.6300 mg | INHALATION_SOLUTION | Freq: Four times a day (QID) | RESPIRATORY_TRACT | Status: DC | PRN
  Administered 2012-05-25: 0.63 mg via RESPIRATORY_TRACT
  Filled 2012-05-25: qty 3

## 2012-05-25 MED ORDER — BIOTENE DRY MOUTH MT LIQD
15.0000 mL | Freq: Two times a day (BID) | OROMUCOSAL | Status: DC
  Administered 2012-05-27: 15 mL via OROMUCOSAL

## 2012-05-25 MED ORDER — POTASSIUM CHLORIDE CRYS ER 20 MEQ PO TBCR
20.0000 meq | EXTENDED_RELEASE_TABLET | Freq: Every day | ORAL | Status: DC
  Administered 2012-05-27 – 2012-05-28 (×2): 20 meq via ORAL
  Filled 2012-05-25 (×3): qty 1

## 2012-05-25 MED ORDER — CHLORHEXIDINE GLUCONATE 0.12 % MT SOLN
15.0000 mL | Freq: Two times a day (BID) | OROMUCOSAL | Status: DC
  Administered 2012-05-25 – 2012-05-27 (×2): 15 mL via OROMUCOSAL
  Filled 2012-05-25 (×9): qty 15

## 2012-05-25 MED ORDER — POTASSIUM CHLORIDE CRYS ER 20 MEQ PO TBCR: 40.0000 meq | EXTENDED_RELEASE_TABLET | Freq: Once | ORAL | Status: DC

## 2012-05-25 NOTE — Progress Notes (Signed)
*  PRELIMINARY RESULTS* Echocardiogram 2D Echocardiogram has been performed.  [name] 05/25/2012, 2:52 PM

## 2012-05-25 NOTE — Progress Notes (Signed)
ANTICOAGULATION CONSULT NOTE - Follow Up Consult  Pharmacy Consult for Lovenox + Coumadin Indication: pulmonary embolus  Allergies  Allergen Reactions  . Ciprofloxacin     RASH  . Codeine     NAUSEA  . Cymbalta (Duloxetine Hcl)     Nausea VOMITING AND ABDOMINAL PAIN, HEADACHE, JUST ABOUT EVERY SIDE EFFECT THE DRUG HAS  . Nortriptyline     Nightmares  . Penicillins     RASH  & ITCHING   --PT STATES SHE CAN TAKE KEFLEX PO AND IV CEPHALOSPORINS  . Statins     Leg cramps  . Tetanus Toxoids     SWELLING, REDNESS  WHOLE ARM    Patient Measurements: Height: 5' 2"  (157.5 cm) Weight: 238 lb 11.2 oz (108.274 kg) IBW/kg (Calculated) : 50.1  Vital Signs: Temp: 98.6 F (37 C) (04/14 0458) Temp src: Oral (04/14 0458) BP: 133/75 mmHg (04/14 1044) Pulse Rate: 80 (04/14 1044)  Labs:  Recent Labs  05/24/12 1457 05/24/12 2124 05/25/12 0321 05/25/12 0834  HGB 10.3*  --  10.5*  --   HCT 32.7*  --  32.5*  --   PLT 121*  --  132*  --   APTT 32  --   --   --   LABPROT 17.0*  --  17.9*  --   INR 1.42  --  1.52*  --   CREATININE 0.91  --  0.93  --   TROPONINI  --  <0.30 <0.30 <0.30    Estimated Creatinine Clearance: 60.6 ml/min (by C-G formula based on Cr of 0.93).  Assessment: 77yof on coumadin PTA for hx of PE, s/p left total shoulder arthroplasty on 4/3, [facility]/13 with CP and SOB. CTA positive for B/L PEs. Her coumadin was resumed and lovenox bridge started.Today is day #2 overlap therapy. INR remains subtherapeutic but slightly trending up. CBC and renal function are stable. No bleeding reported.  Coumadin PTA dose (per Med History): 2.84m po daily  She will need at least 5 days of overlap therapy!   Goal of Therapy:  INR 2-3 Anti-Xa level 0.6-1.2 units/ml 4hrs after LMWH dose given Monitor platelets by anticoagulation protocol: Yes   Plan:  1) Repeat coumadin 547mx 1 2) Continue lovenox 11054mq q12 3) Follow up INR in AM  MarKhushboo, Chuck14/2014,10:56 AM

## 2012-05-25 NOTE — Progress Notes (Signed)
[facility] PROGRESS NOTE  Assessment/Plan:  *Acute pulmonary embolism/ Acute respiratory failure with hypoxia - Heparin and coumadin. - pt desating < 88% with ambulation. Cont O2. - echo pending.    Acute on chronic diastolic congestive heart failure: - BNP mild elevated. - given lasix overnight  Normocytic anemia/  Thrombocytopenia - stable. Follow up with PCP.  HTN : - stable cont to monitor.  Dyslipidemia - Statins.    Exogenous obesity - counseling  Code Status: DNR except would consider central line and pressors "If I were awake."  Family Communication: spoke with patient and her husband  Disposition Plan: admit to telemetry    Consultants:  none  Procedures:  Echo 4.14.2014  Antibiotics:  ECHo  CT angio (indicate start date, and stop date if known)  HPI/Subjective: Still sob No chest pain  Objective: Filed Vitals:   05/24/12 2341 05/25/12 0222 05/25/12 0458 05/25/12 0829  BP:   148/52 131/74  Pulse: 84  80   Temp:   98.6 F (37 C)   TempSrc:   Oral   Resp: 20  20   Height:      Weight:      SpO2: 96% 93% 95%     Intake/Output Summary (Last 24 hours) at 05/25/12 1034 Last data filed at 05/25/12 0842  Gross per 24 hour  Intake    240 ml  Output   1200 ml  Net   -960 ml   Filed Weights   05/24/12 1900 05/24/12 2100  Weight: 109.317 kg (241 lb) 108.274 kg (238 lb 11.2 oz)    Exam:  General: Alert, awake, oriented x3, in no acute distress.  HEENT: No bruits, no goiter.  Heart: Regular rate and rhythm, without murmurs, rubs, gallops.  Lungs: Good air movement, clear to auscultation Abdomen: Soft, nontender, nondistended, positive bowel sounds.  Neuro: Grossly intact, nonfocal.   Data Reviewed: Basic Metabolic Panel:  Recent Labs Lab 05/24/12 1457 05/25/12 0321  NA 143 142  K 3.6 3.3*  CL 104 101  CO2 29 27  GLUCOSE 125* 147*  BUN 21 17  CREATININE 0.91 0.93  CALCIUM 8.6 8.8   Liver Function Tests:  Recent  Labs Lab 05/24/12 1457  AST 19  ALT 17  ALKPHOS 93  BILITOT 0.5  PROT 6.5  ALBUMIN 2.9*   No results found for this basename: LIPASE, AMYLASE,  in the last 168 hours No results found for this basename: AMMONIA,  in the last 168 hours CBC:  Recent Labs Lab 05/24/12 1457 05/25/12 0321  WBC 9.4 9.3  HGB 10.3* 10.5*  HCT 32.7* 32.5*  MCV 85.4 85.1  PLT 121* 132*   Cardiac Enzymes:  Recent Labs Lab 05/24/12 2124 05/25/12 0321 05/25/12 0834  TROPONINI <0.30 <0.30 <0.30   BNP (last 3 results)  Recent Labs  05/24/12 1457  PROBNP 1433.0*   CBG: No results found for this basename: GLUCAP,  in the last 168 hours  No results found for this or any previous visit (from the past 240 hour(s)).   Studies: Ct Angio Chest Pe W/cm &/or Wo Cm  05/24/2012  *RADIOLOGY REPORT*  Clinical Data: Shortness of breath and chest pain.  CT ANGIOGRAPHY CHEST  Technique:  Multidetector CT imaging of the chest using the standard protocol during bolus administration of intravenous contrast. Multiplanar reconstructed images including MIPs were obtained and reviewed to evaluate the vascular anatomy.  Contrast: 174m OMNIPAQUE IOHEXOL 350 MG/ML SOLN  Comparison: CT of the chest dated 02/23/2009 and chest  x-ray dated 05/24/2012  Findings: There are multiple bilateral pulmonary emboli including both lower lobes, the right middle lobe, in the right upper lobe. There also emboli in the left upper lung.  In addition, the patient has patchy areas of lung consolidation in the right upper lobe and in the right middle lobe. There is also a small peripheral posterior infiltrate at the left base.  These could represent areas of pulmonary infarction.   There is a small benign-appearing intrapulmonary lymph node seen on image number 46 of series 5, unchanged since the prior exam.  There is borderline cardiomegaly.  No acute osseous abnormality. Adenopathy.  IMPRESSION: Multiple bilateral pulmonary emboli. These are  acute.  The patient does have a history of prior emboli in the same areas.  Small areas of infiltrate or infarction in the right lung.   Original Report Authenticated By: Lorriane Shire, M.D.    Dg Chest Portable 1 View  05/24/2012  *RADIOLOGY REPORT*  Clinical Data: Shortness of breath, chest pain  PORTABLE CHEST - 1 VIEW  Comparison: 05/11/2012  Findings: Cardiomegaly with mild interstitial edema.  Mild patchy left lower lobe opacity, likely atelectasis. No pleural effusion or pneumothorax.  Left shoulder arthroplasty.  IMPRESSION: Cardiomegaly with mild interstitial edema.   Original Report Authenticated By: Julian Hy, M.D.     Scheduled Meds: . allopurinol  300 mg Oral Daily  . amLODipine  5 mg Oral Daily  . antiseptic oral rinse  15 mL Mouth Rinse q12n4p  . brimonidine  1 drop Right Eye BID  . calcium-vitamin D  1 tablet Oral Daily  . celecoxib  200 mg Oral Daily  . chlorhexidine  15 mL Mouth Rinse BID  . cholecalciferol  3,000 Units Oral Daily  . enoxaparin (LOVENOX) injection  1 mg/kg Subcutaneous Q12H  . enoxaparin   Does not apply Once  . fenofibrate  160 mg Oral QPM  . furosemide  40 mg Intravenous BID  . indapamide  2.5 mg Oral Daily  . ketoconazole  1 application Topical BID  . magnesium oxide  400 mg Oral Daily  . metoprolol tartrate  25 mg Oral BID  . multivitamin with minerals  1 tablet Oral Daily  . nitrofurantoin  50 mg Oral QHS  . pantoprazole  80 mg Oral Daily  . [START ON 05/27/2012] potassium chloride  20 mEq Oral Daily  . potassium chloride  40 mEq Oral BID  . sodium chloride  3 mL Intravenous Q12H  . sodium chloride  3 mL Intravenous Q12H  . timolol  1 drop Right Eye BID  . warfarin   Does not apply Once  . Warfarin - Pharmacist Dosing Inpatient   Does not apply q1800   Continuous Infusions:    Charlynne Cousins  [facility] Pager [phone]. If 8PM-8AM, please contact night-coverage at [email or web address], password [facility] 05/25/2012, 10:34 AM  LOS:  1 day

## 2012-05-25 NOTE — Progress Notes (Signed)
Physical Therapy Evaluation Patient Details Name: [name] MRN: [id number] DOB: 1934/11/19 Today's Date: 05/25/2012 Time: 4010-2725 PT Time Calculation (min): 37 min  PT Assessment / Plan / Recommendation Clinical Impression  Pt is 78 yo female s/p recent left TSA who now presents with bilateral PE. Evaluation was limited today due to subtherapeutic INR. Pt is at min A level with mobility and should be safe to return home with husband and continue therapy course that she was on before admission. PT will follow acutely to advance mobility as medically allowed.     PT Assessment  Patient needs continued PT services    Follow Up Recommendations  Home health PT    Does the patient have the potential to tolerate intense rehabilitation      Barriers to Discharge None      Equipment Recommendations  None recommended by PT    Recommendations for Other Services OT consult   Frequency Min 3X/week    Precautions / Restrictions Precautions Precautions: Shoulder Type of Shoulder Precautions: NWB left UE, no ER >30 degrees Precaution Booklet Issued: No Required Braces or Orthoses: Other Brace/Splint Other Brace/Splint: sling Restrictions Weight Bearing Restrictions: Yes LUE Weight Bearing: Non weight bearing   Pertinent Vitals/Pain 3/10 left shoulder pain, positioned for comfort      Mobility  Bed Mobility Bed Mobility: Supine to Sit;Sitting - Scoot to [place] of Bed;Scooting to [facility];Sit to Supine Supine to Sit: 3: Mod assist;With rails Sitting - Scoot to Edge of Bed: 3: Mod assist Sit to Supine: 4: Min assist Scooting to HOB: 1: +2 Total assist Scooting to [facility]: Patient Percentage: 10% Details for Bed Mobility Assistance: pt tends to slide legs off bed until she is on her back and then needs something to pull on to sit up. Educated on rolling to the right first before sliding legs off bed. Pt relies greatly on husband for assistance with mobility Transfers Transfers: Sit to  Stand;Stand to Sit;Stand Pivot Transfers Sit to Stand: From bed;4: Min assist;From chair/3-in-1 Stand to Sit: 4: Min assist;To chair/3-in-1;To bed Stand Pivot Transfers: 4: Min assist Details for Transfer Assistance: pt transferred 2x from bed to chair. Not left up in chair as INR subtherapeutic and MD mentioned limiting her activity. Transfers made more difficult by inability to use left arm and body habitus. Ambulation/Gait Ambulation/Gait Assistance: Not tested (comment) (INR 1.5) Stairs: No Wheelchair Mobility Wheelchair Mobility: No    Exercises     PT Diagnosis: Generalized weakness;Acute pain  PT Problem List: Decreased strength;Decreased mobility;Decreased balance;Decreased activity tolerance;Decreased range of motion;Pain PT Treatment Interventions: DME instruction;Gait training;Functional mobility training;Therapeutic activities;Therapeutic exercise;Balance training;Patient/family education   PT Goals Acute Rehab PT Goals PT Goal Formulation: With patient Time For Goal Achievement: 06/08/12 Potential to Achieve Goals: Good Pt will go Supine/Side to Sit: with supervision PT Goal: Supine/Side to Sit - Progress: Goal set today Pt will go Sit to Supine/Side: with supervision PT Goal: Sit to Supine/Side - Progress: Goal set today Pt will go Sit to Stand: with supervision PT Goal: Sit to Stand - Progress: Goal set today Pt will go Stand to Sit: with supervision PT Goal: Stand to Sit - Progress: Goal set today Pt will Ambulate: with supervision;with least restrictive assistive device;51 - 150 feet PT Goal: Ambulate - Progress: Goal set today  Visit Information  Last PT Received On: 05/25/12 Assistance Needed: +1    Subjective Data  Subjective: the doctor told me he does not want me getting up at all Patient Stated  Goal: return home   Prior [place] Lives With: Spouse Available Help at Discharge: Family;Available 24 hours/day Type of Home:  House Bathroom Shower/Tub: Chiropodist: Standard Bathroom Accessibility: Yes Home Adaptive Equipment: Bedside commode/3-in-1 Additional Comments: pt has been receiving HHOT and was just told by Dr Onnie Graham that she is close to transitioning to OP PT at his office Prior Function Level of Independence: Needs assistance Needs Assistance: Bathing;Dressing;Grooming;Toileting;Meal Prep;Light Housekeeping;Transfers;Gait Light Housekeeping: Total Gait Assistance: husband has been guarding her since surgery Driving: No 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 difficulties    Solicitor Overall Cognitive Status: Appears within functional limits for tasks assessed/performed Arousal/Alertness: Awake/alert Orientation Level: Appears intact for tasks assessed Behavior During Session: [facility] for tasks performed    Extremity/Trunk Assessment Right Upper Extremity Assessment RUE ROM/Strength/Tone: Deficits RUE ROM/Strength/Tone Deficits: limited ROM of right shoulder, pt reports that it needs to be replaced as well Left Upper Extremity Assessment LUE ROM/Strength/Tone: Deficits;Due to precautions LUE ROM/Strength/Tone Deficits: TSA 05/13/12 Right Lower Extremity Assessment RLE ROM/Strength/Tone: Deficits RLE ROM/Strength/Tone Deficits: past h/o THA, strength grossly 3/5 throughout RLE Sensation: WFL - Light Touch RLE Coordination: WFL - gross motor Left Lower Extremity Assessment LLE ROM/Strength/Tone: Deficits LLE ROM/Strength/Tone Deficits: past h/o THA and TKA, strength grossly 3/5 LLE Sensation: WFL - Light Touch LLE Coordination: WFL - gross motor Trunk Assessment Trunk Assessment: Kyphotic   Balance Balance Balance Assessed: Yes Static Sitting Balance Static Sitting - Balance Support: Right upper extremity supported;Feet supported Static Sitting - Level of Assistance: 6: Modified independent (Device/Increase time)  End of Session PT - End of  Session Equipment Utilized During Treatment:  (LUE sling) Activity Tolerance: Treatment limited secondary to medical complications (Comment) Patient left: in bed;with call bell/phone within reach;with family/visitor present Nurse Communication: Mobility status;Other (comment) (diarrhea)  GP   Leighton Roach, PT  Acute Rehab Services  [place], [place] 05/25/2012, 5:26 PM

## 2012-05-25 NOTE — Progress Notes (Signed)
Courtesy visit with patient well known to me s/p L TSA 4/3, doing exceptionally well with initial post op recovery until events as noted in recent H and P with chest pain and SOB. Ms Marcotte reports having taken no more than 4 or 5 pain pills since surgery. Was scheduled for 1st post op visit in office today.  Exam shows resolving ecchymosis LUE to elbow, compartments soft, N/V intact, excellent passive motion. Incision clean and dry.  From ortho standpoint may use LUE for midline ADL's, no external rotation beyond 30degrees. No weight bearing LUE. I have asked Antionetta to make a follow up appointment in my office next week. Thank you for the excellent care provided to this very fine patient and friend.  Please call with any questions

## 2012-05-26 LAB — BASIC METABOLIC PANEL
GFR calc Af Amer: 32 mL/min — ABNORMAL LOW (ref >90)
GFR calc non Af Amer: 28 mL/min — ABNORMAL LOW (ref >90)
Glucose, Bld: 126 mg/dL — ABNORMAL HIGH (ref 70–99)
Potassium: 3.7 mEq/L (ref 3.5–5.1)
Sodium: 143 mEq/L (ref 135–145)

## 2012-05-26 MED ORDER — WARFARIN SODIUM 2.5 MG PO TABS
2.5000 mg | ORAL_TABLET | Freq: Once | ORAL | Status: AC
  Administered 2012-05-26: 2.5 mg via ORAL
  Filled 2012-05-26: qty 1

## 2012-05-26 MED ORDER — SODIUM CHLORIDE 0.9 % IV SOLN
INTRAVENOUS | Status: AC
  Administered 2012-05-26: 11:00:00 via INTRAVENOUS

## 2012-05-26 MED ORDER — ENOXAPARIN SODIUM 120 MG/0.8ML ~~LOC~~ SOLN
1.0000 mg/kg | Freq: Two times a day (BID) | SUBCUTANEOUS | Status: DC
  Administered 2012-05-26 – 2012-05-27 (×3): 105 mg via SUBCUTANEOUS
  Filled 2012-05-26 (×6): qty 0.8

## 2012-05-26 NOTE — Progress Notes (Signed)
Physical Therapy Treatment Patient Details Name: [name] MRN: [id number] DOB: [birth year] Today's Date: 05/26/2012 Time: 8182-9937 PT Time Calculation (min): 21 min  PT Assessment / Plan / Recommendation Comments on Treatment Session  Pt very pleasant & motivated to participate in therapy.  Pt with decreased activity tolerance.  02 sats 91% 3L at rest upon arrival.  Trialed sit<>stand without supplemental 02 & sats dropped to 84% RA.  3L reapplied & sats returned to 91%.  Pt ambulated ~15' on 3L 02.      Follow Up Recommendations  Home health PT     Does the patient have the potential to tolerate intense rehabilitation     Barriers to Discharge        Equipment Recommendations  None recommended by PT    Recommendations for Other Services    Frequency Min 3X/week   Plan Discharge plan remains appropriate    Precautions / Restrictions Precautions Precautions: Shoulder Type of Shoulder Precautions: NWB left UE, no ER >30 degrees Precaution Booklet Issued: No Required Braces or Orthoses: Other Brace/Splint Other Brace/Splint: sling Restrictions Weight Bearing Restrictions: Yes LUE Weight Bearing: Non weight bearing         Mobility  Bed Mobility Bed Mobility: Not assessed Supine to Sit: 4: Min assist Sitting - Scoot to Edge of Bed: 5: Supervision Transfers Transfers: Sit to Stand;Stand to Sit Sit to Stand: With upper extremity assist;With armrests;From chair/3-in-1;4: Min guard Stand to Sit: With upper extremity assist;With armrests;To chair/3-in-1;4: Min guard Details for Transfer Assistance: (S) for safety.  Pt braces back of LE's against chair for stability.   Ambulation/Gait Ambulation/Gait Assistance: 4: Min assist Ambulation Distance (Feet): 15 Feet Assistive device: 1 person hand held assist Ambulation/Gait Assistance Details: (A) for stability provided via HHA on Rt.  Pt with short shuffling steps.  c/o cramps in ribs which causes her to be very tense &  difficulty taking deep breathes.    Gait Pattern: Decreased stride length;Shuffle;Wide base of support General Gait Details: 02 sats 91-93% 3L 02 via [place].   Stairs: No Wheelchair Mobility Wheelchair Mobility: No      PT Goals Acute Rehab PT Goals Time For Goal Achievement: 06/08/12 Potential to Achieve Goals: Good Pt will go Supine/Side to Sit: with supervision Pt will go Sit to Supine/Side: with supervision Pt will go Sit to Stand: with supervision PT Goal: Sit to Stand - Progress: Progressing toward goal Pt will go Stand to Sit: with supervision PT Goal: Stand to Sit - Progress: Progressing toward goal Pt will Ambulate: with supervision;with least restrictive assistive device;51 - 150 feet PT Goal: Ambulate - Progress: Progressing toward goal  Visit Information  Last PT Received On: 05/26/12 Assistance Needed: +1    Subjective Data      Cognition  Cognition Overall Cognitive Status: Appears within functional limits for tasks assessed/performed Arousal/Alertness: Awake/alert Orientation Level: Appears intact for tasks assessed Behavior During Session: [facility] for tasks performed    Balance  Static Sitting Balance Static Sitting - Level of Assistance: 6: Modified independent (Device/Increase time) Dynamic Standing Balance Dynamic Standing - Comments: supervision to min A  End of Session PT - End of Session Equipment Utilized During Treatment: Oxygen (LUE sling) Activity Tolerance: Other (comment) (cramps) Patient left: in chair;with call bell/phone within reach Nurse Communication: Mobility status;Other (comment) (pts cramps.  RN states she & MD are aware of cramps.  )     Sarajane Marek, PTA [phone] 05/26/2012

## 2012-05-26 NOTE — Progress Notes (Addendum)
[facility] PROGRESS NOTE  Assessment/Plan:  *Acute pulmonary embolism/ Acute respiratory failure with hypoxia - Heparin and coumadin per pharmacy. - pt desating < 88% with ambulation. Cont O2. - ECHO: ejection fraction was in the range of 55% to 60%.  chronic diastolic congestive heart failure: - d/c lasix. As cr. Is increasing. - B-met in am  Normocytic anemia/  Thrombocytopenia - stable. Follow up with PCP.  HTN : - stable cont to monitor.  Dyslipidemia - Statins.    Exogenous obesity - counseling  Code Status: DNR except would consider central line and pressors "If I were awake."  Family Communication: spoke with patient and her husband  Disposition Plan: admit to telemetry    Consultants:  none  Procedures:  Echo 4.14.2014  Antibiotics:  ECHo: ejection fraction was in the range of 55% to 60%.  CT angio (indicate start date, and stop date if known)  HPI/Subjective: SOB improved. No chest pain  Objective: Filed Vitals:   05/26/12 0715 05/26/12 0907 05/26/12 0930 05/26/12 0939  BP: 127/76  112/67   Pulse:   73   Temp:      TempSrc:      Resp:      Height:      Weight:      SpO2:  82% 91% 93%    Intake/Output Summary (Last 24 hours) at 05/26/12 0955 Last data filed at 05/26/12 0935  Gross per 24 hour  Intake   1200 ml  Output   1200 ml  Net      0 ml   Filed Weights   05/24/12 1900 05/24/12 2100 05/26/12 0500  Weight: 109.317 kg (241 lb) 108.274 kg (238 lb 11.2 oz) 105.915 kg (233 lb 8 oz)    Exam:  General: Alert, awake, oriented x3, in no acute distress.  HEENT: No bruits, no goiter.  Heart: Regular rate and rhythm, without murmurs, rubs, gallops.  Lungs: Good air movement, clear to auscultation Abdomen: Soft, nontender, nondistended, positive bowel sounds.  Neuro: Grossly intact, nonfocal.   Data Reviewed: Basic Metabolic Panel:  Recent Labs Lab 05/24/12 1457 05/25/12 0321 05/26/12 0628  NA 143 142 143  K 3.6 3.3*  3.7  CL 104 101 103  CO2 29 27 28   GLUCOSE 125* 147* 126*  BUN 21 17 33*  CREATININE 0.91 0.93 1.73*  CALCIUM 8.6 8.8 8.1*   Liver Function Tests:  Recent Labs Lab 05/24/12 1457  AST 19  ALT 17  ALKPHOS 93  BILITOT 0.5  PROT 6.5  ALBUMIN 2.9*   No results found for this basename: LIPASE, AMYLASE,  in the last 168 hours No results found for this basename: AMMONIA,  in the last 168 hours CBC:  Recent Labs Lab 05/24/12 1457 05/25/12 0321  WBC 9.4 9.3  HGB 10.3* 10.5*  HCT 32.7* 32.5*  MCV 85.4 85.1  PLT 121* 132*   Cardiac Enzymes:  Recent Labs Lab 05/24/12 2124 05/25/12 0321 05/25/12 0834  TROPONINI <0.30 <0.30 <0.30   BNP (last 3 results)  Recent Labs  05/24/12 1457  PROBNP 1433.0*   CBG: No results found for this basename: GLUCAP,  in the last 168 hours  No results found for this or any previous visit (from the past 240 hour(s)).   Studies: Ct Angio Chest Pe W/cm &/or Wo Cm  05/24/2012  *RADIOLOGY REPORT*  Clinical Data: Shortness of breath and chest pain.  CT ANGIOGRAPHY CHEST  Technique:  Multidetector CT imaging of the chest using the standard protocol during  bolus administration of intravenous contrast. Multiplanar reconstructed images including MIPs were obtained and reviewed to evaluate the vascular anatomy.  Contrast: 146m OMNIPAQUE IOHEXOL 350 MG/ML SOLN  Comparison: CT of the chest dated 02/23/2009 and chest x-ray dated 05/24/2012  Findings: There are multiple bilateral pulmonary emboli including both lower lobes, the right middle lobe, in the right upper lobe. There also emboli in the left upper lung.  In addition, the patient has patchy areas of lung consolidation in the right upper lobe and in the right middle lobe. There is also a small peripheral posterior infiltrate at the left base.  These could represent areas of pulmonary infarction.   There is a small benign-appearing intrapulmonary lymph node seen on image number 46 of series 5, unchanged  since the prior exam.  There is borderline cardiomegaly.  No acute osseous abnormality. Adenopathy.  IMPRESSION: Multiple bilateral pulmonary emboli. These are acute.  The patient does have a history of prior emboli in the same areas.  Small areas of infiltrate or infarction in the right lung.   Original Report Authenticated By: JLorriane Shire M.D.    Dg Chest Portable 1 View  05/24/2012  *RADIOLOGY REPORT*  Clinical Data: Shortness of breath, chest pain  PORTABLE CHEST - 1 VIEW  Comparison: 05/11/2012  Findings: Cardiomegaly with mild interstitial edema.  Mild patchy left lower lobe opacity, likely atelectasis. No pleural effusion or pneumothorax.  Left shoulder arthroplasty.  IMPRESSION: Cardiomegaly with mild interstitial edema.   Original Report Authenticated By: SJulian Hy M.D.     Scheduled Meds: . allopurinol  300 mg Oral Daily  . amLODipine  5 mg Oral Daily  . antiseptic oral rinse  15 mL Mouth Rinse q12n4p  . brimonidine  1 drop Right Eye BID  . calcium-vitamin D  1 tablet Oral Daily  . celecoxib  200 mg Oral Daily  . chlorhexidine  15 mL Mouth Rinse BID  . cholecalciferol  3,000 Units Oral Daily  . enoxaparin (LOVENOX) injection  1 mg/kg Subcutaneous Q12H  . enoxaparin   Does not apply Once  . fenofibrate  160 mg Oral QPM  . furosemide  40 mg Intravenous BID  . indapamide  2.5 mg Oral Daily  . ketoconazole  1 application Topical BID  . magnesium oxide  400 mg Oral Daily  . metoprolol tartrate  25 mg Oral BID  . multivitamin with minerals  1 tablet Oral Daily  . nitrofurantoin  50 mg Oral QHS  . pantoprazole  80 mg Oral Daily  . [START ON 05/27/2012] potassium chloride  20 mEq Oral Daily  . sodium chloride  3 mL Intravenous Q12H  . sodium chloride  3 mL Intravenous Q12H  . timolol  1 drop Right Eye BID  . warfarin   Does not apply Once  . Warfarin - Pharmacist Dosing Inpatient   Does not apply q1800   Continuous Infusions:    FCharlynne Cousins [facility] Pager [phone] If 8PM-8AM, please contact night-coverage at [email or web address], password [facility]05/26/2012, 9:55 AM  LOS: 2 days

## 2012-05-26 NOTE — Evaluation (Signed)
Occupational Therapy Evaluation Patient Details Name: [name] MRN: [id number] DOB: [birth year] Today's Date: 05/26/2012 Time: 8938-1017 OT Time Calculation (min): 35 min  OT Assessment / Plan / Recommendation Clinical Impression  78 yo female  s/o left TSA who presented with SOB resulting in bilateral PEs. Pt currently presents with poor activity tolerance and requires o2 at all times with frequent rest breaks with ADLs    OT Assessment  Patient needs continued OT Services    Follow Up Recommendations  Home health OT    Barriers to Discharge None    Equipment Recommendations       Recommendations for Other Services    Frequency  Min 2X/week    Precautions / Restrictions Precautions Precautions: Shoulder Type of Shoulder Precautions: NWB left UE, no ER >30 degrees Required Braces or Orthoses: Other Brace/Splint Other Brace/Splint: sling Restrictions Weight Bearing Restrictions: Yes LUE Weight Bearing: Non weight bearing   Pertinent Vitals/Pain No reports of pain    ADL  Grooming: Performed Where Assessed - Grooming: Unsupported standing Upper Body Bathing: Performed Where Assessed - Upper Body Bathing: Unsupported sitting Lower Body Bathing: Maximal assistance Where Assessed - Lower Body Bathing: Unsupported sit to stand Upper Body Dressing: Simulated Where Assessed - Upper Body Dressing: Unsupported standing Toilet Transfer: Performed;Min guard Toilet Transfer Method: Stand pivot Science writer: Bedside commode Toileting - Clothing Manipulation and Hygiene: Minimal assistance Where Assessed - Camera operator Manipulation and Hygiene: Standing Transfers/Ambulation Related to ADLs: functional ambulation around room with HHA with min A. Pt reports using a cane at home PTA in the community but able to get around house with supervision to mod I ADL Comments: Pt presents with decreased actiivty tolerance requiring frequent rest breaks. Pt required 3 lit  of O2 to maintain sats >90%with activity. Trial of perform ADL on RA and pt desats into low 80s but could recoever within a min with O2 applied. Husband present for eval and discussed possible need for o2 at home . Recommend to continue with HHOT due to limited activity tolerance. Bed mobility preformed with min A- pt reports this was true PTA after shoulder sx    OT Diagnosis: Generalized weakness  OT Problem List: Decreased strength;Decreased activity tolerance;Impaired balance (sitting and/or standing);Obesity;Impaired UE functional use OT Treatment Interventions: Self-care/ADL training;Therapeutic exercise;Energy conservation;DME and/or AE instruction;Therapeutic activities;Patient/family education;Balance training   OT Goals Acute Rehab OT Goals OT Goal Formulation: With patient ADL Goals Pt Will Transfer to Toilet: with modified independence ADL Goal: Toilet Transfer - Progress: Goal set today Pt Will Perform Toileting - Clothing Manipulation: with modified independence ADL Goal: Toileting - Clothing Manipulation - Progress: Goal set today Pt Will Perform Toileting - Hygiene: with modified independence ADL Goal: Toileting - Hygiene - Progress: Goal set today Miscellaneous OT Goals Miscellaneous OT Goal #1: Pt will tolerate 10 min of activity with only 1 rest breaks while maintaining O2 sats above 90% OT Goal: Miscellaneous Goal #1 - Progress: Goal set today OT Goal: Miscellaneous Goal #2 - Progress: Goal set today  Visit Information       Subjective Data  Subjective: I am ready to get up out of this bed Patient Stated Goal: to return home and return to therapy    Prior Functioning     Home Living Lives With: Spouse Available Help at Discharge: Family;Available 24 hours/day Type of Home: House Home Access: Stairs to enter [employer] of Steps: 3 Home Layout: One level Bathroom Shower/Tub: Government social research officer Accessibility:  Yes Home Adaptive Equipment: Bedside commode/3-in-1;Shower chair with back Additional Comments: pt has been receiving HHOT and was just told by Dr Onnie Graham that she is close to transitioning to OP PT at his office Prior Function Level of Independence: Needs assistance Needs Assistance: Bathing;Dressing;Toileting;Meal Prep         Vision/Perception Vision - History Baseline Vision: Wears glasses only for reading Vision - Assessment Eye Alignment: Within Functional Limits Perception Perception: Within Functional Limits Praxis Praxis: Intact   Cognition  Cognition Overall Cognitive Status: Appears within functional limits for tasks assessed/performed Arousal/Alertness: Awake/alert Orientation Level: Appears intact for tasks assessed Behavior During Session: [facility] for tasks performed    Extremity/Trunk Assessment Right Upper Extremity Assessment RUE ROM/Strength/Tone: Deficits RUE ROM/Strength/Tone Deficits: limited ROM of right shoulder, pt reports that it needs to be replaced as well Left Upper Extremity Assessment LUE ROM/Strength/Tone: Deficits;Due to precautions Trunk Assessment Trunk Assessment: Kyphotic     Mobility Bed Mobility Supine to Sit: 4: Min assist Sitting - Scoot to Edge of Bed: 5: Supervision Transfers Transfers: Sit to Stand;Stand to Sit Sit to Stand: 5: Supervision Stand to Sit: 5: Supervision Details for Transfer Assistance: Transfers with steady A with HHA     Exercise     Balance Static Sitting Balance Static Sitting - Level of Assistance: 6: Modified independent (Device/Increase time) Dynamic Standing Balance Dynamic Standing - Comments: supervision to min A   End of Session OT - End of Session Activity Tolerance: Patient tolerated treatment well Patient left: in chair;with family/visitor present Nurse Communication: Mobility status  GO     Nicoletta Ba 05/26/2012, 9:18 AM

## 2012-05-26 NOTE — Care Management Note (Signed)
    Page 1 of 2   05/27/2012     12:37:52 PM   CARE MANAGEMENT NOTE 05/27/2012  Patient:  [name], [name]   Account Number:  [id number]  Date Initiated:  05/26/2012  Documentation initiated by:  GRAVES-BIGELOW,Bliss Tsang  Subjective/Objective Assessment:   Pt admitted with Acute pulmonary embolism/ Acute respiratory failure with hypoxia. Pt is from home with husband.     Action/Plan:   Plan to return home at d/c with home lovenox and [facility] services with [employer]. Referral has been made. Will need HH orders. RN to ambulate pt to see if she qualifies for home 02.   Anticipated DC Date:  05/28/2012   Anticipated DC Plan:  [place]  CM consult      [facility] Choice  HOME HEALTH   Choice offered to / List presented to:  C-1 Patient        [place] arranged  HH-1 RN  [place] OT      [facility] agency  [facility]   Status of service:  Completed, signed off Medicare Important Message given?   (If response is "NO", the following Medicare IM given date fields will be blank) Date Medicare IM given:   Date Additional Medicare IM given:    Discharge Disposition:  [place]  Per UR Regulation:  Reviewed for med. necessity/level of care/duration of stay  If discussed at [place] of Stay Meetings, dates discussed:    Comments:  05-27-12 [place], RN,BSN [phone] [facility] orders received and pt set up with [facility].   05-26-12 [place], [place] [phone] CM will place benefits check for lovenox and make pt aware of cost. Pt states she has about 3-4 syringes at home. Husband to check on amt of lovenox at home and the dosage that is home. CM will continue to monitor for disposition needs.

## 2012-05-26 NOTE — Progress Notes (Addendum)
ANTICOAGULATION CONSULT NOTE - Follow Up Consult  Pharmacy Consult for Lovenox + Coumadin Indication: pulmonary embolus  Allergies  Allergen Reactions  . Ciprofloxacin     RASH  . Codeine     NAUSEA  . Cymbalta (Duloxetine Hcl)     Nausea VOMITING AND ABDOMINAL PAIN, HEADACHE, JUST ABOUT EVERY SIDE EFFECT THE DRUG HAS  . Nortriptyline     Nightmares  . Penicillins     RASH  & ITCHING   --PT STATES SHE CAN TAKE KEFLEX PO AND IV CEPHALOSPORINS  . Statins     Leg cramps  . Tetanus Toxoids     SWELLING, REDNESS  WHOLE ARM    Patient Measurements: Height: 5' 2"  (157.5 cm) Weight: 233 lb 8 oz (105.915 kg) IBW/kg (Calculated) : 50.1  Vital Signs: Temp: 98.1 F (36.7 C) (04/15 0500) BP: 112/67 mmHg (04/15 0930) Pulse Rate: 73 (04/15 0930)  Labs:  Recent Labs  05/24/12 1457 05/24/12 2124 05/25/12 0321 05/25/12 0834 05/26/12 0628  HGB 10.3*  --  10.5*  --   --   HCT 32.7*  --  32.5*  --   --   PLT 121*  --  132*  --   --   APTT 32  --   --   --   --   LABPROT 17.0*  --  17.9*  --  22.9*  INR 1.42  --  1.52*  --  2.13*  CREATININE 0.91  --  0.93  --  1.73*  TROPONINI  --  <0.30 <0.30 <0.30  --     Estimated Creatinine Clearance: 32.1 ml/min (by C-G formula based on Cr of 1.73).  Assessment: 77yof on coumadin PTA for hx of PE, s/p left total shoulder arthroplasty on 4/3, presented to [facility] on 4/13 with CP and SOB. CTA positive for B/L PEs. Her coumadin was resumed and lovenox bridge started.Today is day #3 overlap therapy. INR is now therapeutic. No CBC today. Patient is now in acute renal failure with sCr jumping from 0.93 to 1.73. CrCl still > 82m/min so q12 lovenox is still appropriate but weight has decreased to 105kg so will adjust dose accordingly. No bleeding reported.  Coumadin PTA dose (per Med History): 2.578mpo daily  She will need at least 5 days of overlap therapy!   Goal of Therapy:  INR 2-3 Anti-Xa level 0.6-1.2 units/ml 4hrs after LMWH dose  given Monitor platelets by anticoagulation protocol: Yes   Plan:  1) Coumadin 2.54m20m 1 2) Change lovenox to 1054m49m q12 3) Follow up INR in AM  MarkKayleah, [place]5/2014,11:04 AM

## 2012-05-27 DIAGNOSIS — E669 Obesity, unspecified: Secondary | ICD-10-CM

## 2012-05-27 DIAGNOSIS — D649 Anemia, unspecified: Secondary | ICD-10-CM

## 2012-05-27 LAB — PROTIME-INR
INR: 2.42 — ABNORMAL HIGH (ref 0.00–1.49)
Prothrombin Time: 25.2 seconds — ABNORMAL HIGH (ref 11.6–15.2)

## 2012-05-27 LAB — BASIC METABOLIC PANEL
CO2: 28 mEq/L (ref 19–32)
Calcium: 7.9 mg/dL — ABNORMAL LOW (ref 8.4–10.5)
GFR calc Af Amer: 39 mL/min — ABNORMAL LOW (ref >90)
GFR calc non Af Amer: 34 mL/min — ABNORMAL LOW (ref >90)
Sodium: 142 mEq/L (ref 135–145)

## 2012-05-27 MED ORDER — WARFARIN SODIUM 2.5 MG PO TABS
2.5000 mg | ORAL_TABLET | Freq: Once | ORAL | Status: AC
  Administered 2012-05-27: 2.5 mg via ORAL
  Filled 2012-05-27: qty 1

## 2012-05-27 NOTE — Progress Notes (Signed)
Patient refused  cpap tonight. Patient wants to try nasal cannula for tonight. RT will continue to monitor.

## 2012-05-27 NOTE — Progress Notes (Signed)
[facility] PROGRESS NOTE  Assessment/Plan:  *Acute pulmonary embolism/ Acute respiratory failure with hypoxia - Heparin and coumadin per pharmacy. - pt desating < 88% with ambulation. Cont O2. - ECHO: ejection fraction was in the range of 55% to 60%. - ambulate pt and check O2 sats.  chronic diastolic congestive heart failure: - d/c lasix. Creatinine improving. - B-met in am  Normocytic anemia/  Thrombocytopenia - stable. Follow up with PCP.  HTN : - stable cont to monitor.  Dyslipidemia - Statins.    Exogenous obesity - counseling  Code Status: DNR except would consider central line and pressors "If I were awake."  Family Communication: spoke with patient and her husband  Disposition Plan: admit to telemetry    Consultants:  none  Procedures:  Echo 4.14.2014  Antibiotics:  ECHo: ejection fraction was in the range of 55% to 60%.  CT angio (indicate start date, and stop date if known)  HPI/Subjective: SOB improved. No chest pain  Objective: Filed Vitals:   05/26/12 1706 05/26/12 2140 05/27/12 0500 05/27/12 1012  BP:  145/74 144/80 137/71  Pulse:  75 67 68  Temp:  97.7 F (36.5 C) 97.3 F (36.3 C)   TempSrc:      Resp:  18 18   Height:      Weight:   106.232 kg (234 lb 3.2 oz)   SpO2: 92% 91% 96%     Intake/Output Summary (Last 24 hours) at 05/27/12 1225 Last data filed at 05/27/12 0900  Gross per 24 hour  Intake 978.33 ml  Output    301 ml  Net 677.33 ml   Filed Weights   05/24/12 2100 05/26/12 0500 05/27/12 0500  Weight: 108.274 kg (238 lb 11.2 oz) 105.915 kg (233 lb 8 oz) 106.232 kg (234 lb 3.2 oz)    Exam:  General: Alert, awake, oriented x3, in no acute distress.  HEENT: No bruits, no goiter.  Heart: Regular rate and rhythm, without murmurs, rubs, gallops.  Lungs: Good air movement, clear to auscultation Abdomen: Soft, nontender, nondistended, positive bowel sounds.  Neuro: Grossly intact, nonfocal.   Data  Reviewed: Basic Metabolic Panel:  Recent Labs Lab 05/24/12 1457 05/25/12 0321 05/26/12 0628 05/27/12 0510  NA 143 142 143 142  K 3.6 3.3* 3.7 3.6  CL 104 101 103 104  CO2 29 27 28 28   GLUCOSE 125* 147* 126* 135*  BUN 21 17 33* 38*  CREATININE 0.91 0.93 1.73* 1.46*  CALCIUM 8.6 8.8 8.1* 7.9*   Liver Function Tests:  Recent Labs Lab 05/24/12 1457  AST 19  ALT 17  ALKPHOS 93  BILITOT 0.5  PROT 6.5  ALBUMIN 2.9*   No results found for this basename: LIPASE, AMYLASE,  in the last 168 hours No results found for this basename: AMMONIA,  in the last 168 hours CBC:  Recent Labs Lab 05/24/12 1457 05/25/12 0321  WBC 9.4 9.3  HGB 10.3* 10.5*  HCT 32.7* 32.5*  MCV 85.4 85.1  PLT 121* 132*   Cardiac Enzymes:  Recent Labs Lab 05/24/12 2124 05/25/12 0321 05/25/12 0834  TROPONINI <0.30 <0.30 <0.30   BNP (last 3 results)  Recent Labs  05/24/12 1457  PROBNP 1433.0*   CBG: No results found for this basename: GLUCAP,  in the last 168 hours  No results found for this or any previous visit (from the past 240 hour(s)).   Studies: No results found.  Scheduled Meds: . allopurinol  300 mg Oral Daily  . amLODipine  5  mg Oral Daily  . antiseptic oral rinse  15 mL Mouth Rinse q12n4p  . brimonidine  1 drop Right Eye BID  . calcium-vitamin D  1 tablet Oral Daily  . celecoxib  200 mg Oral Daily  . chlorhexidine  15 mL Mouth Rinse BID  . cholecalciferol  3,000 Units Oral Daily  . enoxaparin (LOVENOX) injection  1 mg/kg Subcutaneous Q12H  . enoxaparin   Does not apply Once  . fenofibrate  160 mg Oral QPM  . indapamide  2.5 mg Oral Daily  . ketoconazole  1 application Topical BID  . magnesium oxide  400 mg Oral Daily  . metoprolol tartrate  25 mg Oral BID  . multivitamin with minerals  1 tablet Oral Daily  . nitrofurantoin  50 mg Oral QHS  . pantoprazole  80 mg Oral Daily  . potassium chloride  20 mEq Oral Daily  . sodium chloride  3 mL Intravenous Q12H  . sodium  chloride  3 mL Intravenous Q12H  . timolol  1 drop Right Eye BID  . warfarin  2.5 mg Oral ONCE-1800  . warfarin   Does not apply Once  . Warfarin - Pharmacist Dosing Inpatient   Does not apply q1800   Continuous Infusions:    Charlynne Cousins  [facility] Pager [phone]. If 8PM-8AM, please contact night-coverage at [email or web address], password [email or web address] 05/27/2012, 12:25 PM  LOS: 3 days

## 2012-05-27 NOTE — Progress Notes (Signed)
ANTICOAGULATION CONSULT NOTE - Follow Up Consult  Pharmacy Consult for Lovenox + Coumadin Indication: pulmonary embolus  Allergies  Allergen Reactions  . Ciprofloxacin     RASH  . Codeine     NAUSEA  . Cymbalta (Duloxetine Hcl)     Nausea VOMITING AND ABDOMINAL PAIN, HEADACHE, JUST ABOUT EVERY SIDE EFFECT THE DRUG HAS  . Nortriptyline     Nightmares  . Penicillins     RASH  & ITCHING   --PT STATES SHE CAN TAKE KEFLEX PO AND IV CEPHALOSPORINS  . Statins     Leg cramps  . Tetanus Toxoids     SWELLING, REDNESS  WHOLE ARM    Patient Measurements: Height: 5' 2"  (157.5 cm) Weight: 234 lb 3.2 oz (106.232 kg) IBW/kg (Calculated) : 50.1  Vital Signs: Temp: 97.3 F (36.3 C) (04/16 0500) BP: 144/80 mmHg (04/16 0500) Pulse Rate: 67 (04/16 0500)  Labs:  Recent Labs  05/24/12 1457 05/24/12 2124 05/25/12 0321 05/25/12 0834 05/26/12 0628 05/27/12 0510  HGB 10.3*  --  10.5*  --   --   --   HCT 32.7*  --  32.5*  --   --   --   PLT 121*  --  132*  --   --   --   APTT 32  --   --   --   --   --   LABPROT 17.0*  --  17.9*  --  22.9* 25.2*  INR 1.42  --  1.52*  --  2.13* 2.42*  CREATININE 0.91  --  0.93  --  1.73* 1.46*  TROPONINI  --  <0.30 <0.30 <0.30  --   --     Estimated Creatinine Clearance: 38.1 ml/min (by C-G formula based on Cr of 1.46).  Assessment: 77yof on coumadin PTA for hx of PE, s/p left total shoulder arthroplasty on 4/3, presented to [facility] on 4/13 with CP and SOB. CTA positive for B/L PEs. Her coumadin was resumed and lovenox bridge started.Today is day #4 overlap therapy. INR is therapeutic. No CBC today. SCr improving, CrCl 63m/min. No bleeding reported.  Coumadin PTA dose (per Med History): 2.562mpo daily  She will need at least 5 days of overlap therapy!   Goal of Therapy:  INR 2-3 Anti-Xa level 0.6-1.2 units/ml 4hrs after LMWH dose given Monitor platelets by anticoagulation protocol: Yes   Plan:  1) Coumadin 2.3m70m 1 2) Continue lovenox 103m24m  q12 3) Follow up INR in AM  MarkChanna, [place]6/2014,8:44 AM

## 2012-05-27 NOTE — Progress Notes (Signed)
Ambulated pt, 02 sat dropped to 86 % on room air;just as we got outside of the room. O2 saturation back to 91-97% after sitting with oxygen 2l via nasal cannula.

## 2012-05-27 NOTE — Progress Notes (Signed)
RN CM did speak to pt this am in reference to lovenox injections. Pt had 3 (177m) syringes at home that are still in date. CM did discuss with pharmacy on how many syringes she will need. Pharmacist stated that the 3 (100 mg) syringes will be good. If pt is to d/c today she will need one tonight and then the two tomorrow. No further needs from CM at this time. GBethena Roys RN, BSN [phone]

## 2012-05-27 NOTE — Progress Notes (Signed)
Patient places CPAP on self.

## 2012-05-28 LAB — CBC
MCHC: 31.3 g/dL (ref 30.0–36.0)
Platelets: 143 10*3/uL — ABNORMAL LOW (ref 150–400)
RDW: 16.5 % — ABNORMAL HIGH (ref 11.5–15.5)

## 2012-05-28 LAB — PROTIME-INR
INR: 2.32 — ABNORMAL HIGH (ref 0.00–1.49)
Prothrombin Time: 24.4 seconds — ABNORMAL HIGH (ref 11.6–15.2)

## 2012-05-28 MED ORDER — ENOXAPARIN SODIUM 150 MG/ML ~~LOC~~ SOLN: 1.5000 mg/kg | SUBCUTANEOUS | Status: DC

## 2012-05-28 MED ORDER — WARFARIN SODIUM 2.5 MG PO TABS
2.5000 mg | ORAL_TABLET | Freq: Once | ORAL | Status: DC
  Filled 2012-05-28: qty 1

## 2012-05-28 MED ORDER — ENOXAPARIN SODIUM 150 MG/ML ~~LOC~~ SOLN
1.5000 mg/kg | SUBCUTANEOUS | Status: DC
  Administered 2012-05-28: 160 mg via SUBCUTANEOUS
  Filled 2012-05-28: qty 2

## 2012-05-28 NOTE — Progress Notes (Signed)
Physical Therapy Treatment Patient Details Name: [name] MRN: [id number] DOB: [birth year] Today's Date: 05/28/2012 Time: 4098-1191 PT Time Calculation (min): 24 min  PT Assessment / Plan / Recommendation Comments on Treatment Session  Pt very pleasant & motivated to participate in therapy.  Pt with decreased activity tolerance.  02 sats 95% 1.5L at rest upon arrival.  Trialed ambulating without supplemental 02 & sats dropped to 84%.  1.5L reapplied & sats returned to 93%.      Follow Up Recommendations  Home health PT     Does the patient have the potential to tolerate intense rehabilitation     Barriers to Discharge        Equipment Recommendations  None recommended by PT    Recommendations for Other Services    Frequency Min 3X/week   Plan Discharge plan remains appropriate    Precautions / Restrictions Precautions Precautions: Shoulder Type of Shoulder Precautions: NWB left UE, no ER >30 degrees Precaution Booklet Issued: No Required Braces or Orthoses: Other Brace/Splint Other Brace/Splint: sling Restrictions Weight Bearing Restrictions: Yes LUE Weight Bearing: Non weight bearing   Pertinent Vitals/Pain SATURATION QUALIFICATIONS: (This note is used to comply with regulatory documentation for home oxygen)  Patient Saturations on Room Air at Rest = 94%  Patient Saturations on Room Air while Ambulating = 84%   Please briefly explain why patient needs home oxygen:  Pt would benefit from supplemental 02 at home to allow pt to perform functional mobility       Mobility  Bed Mobility Bed Mobility: Not assessed Transfers Transfers: Sit to Stand;Stand to Sit Sit to Stand: With upper extremity assist;With armrests;From chair/3-in-1;4: Min guard Stand to Sit: With upper extremity assist;With armrests;To chair/3-in-1;4: Min guard Details for Transfer Assistance: (S) for safety.  Pt braces back of LE's against chair for stability.  Practiced transfers x5 to increase  activity tolerance. Ambulation/Gait Ambulation/Gait Assistance: 4: Min guard Ambulation Distance (Feet): 60 Feet Assistive device: 1 person hand held assist Ambulation/Gait Assistance Details: A for stability on right.  Patient with shuffling steps.  Patient reports decreased rib cramps.  Patient's o2 sat decreased to 83% RA after ambulating 30 ft, stopped & took standing rest break & performed pursed lip breathing & sats increased to 87%.  Returned back to pt's room & reapplied supplemental 02.  Patient's o2 sat increased to 93/94% 1.5L 02 after one minute of rest Gait Pattern: Decreased stride length;Shuffle;Wide base of support General Gait Details: Desats to ~83% RA after 30'.  Cues for pursed lip breathing.  Increased back to 94-95% on 1.5L 02 with seated rest.    Exercises Total Joint Exercises Long Arc Quad: AROM;Both;5 reps   PT Diagnosis:    PT Problem List:   PT Treatment Interventions:     PT Goals Acute Rehab PT Goals PT Goal: Sit to Stand - Progress: Progressing toward goal PT Goal: Stand to Sit - Progress: Progressing toward goal PT Goal: Ambulate - Progress: Progressing toward goal  Visit Information  Last PT Received On: 05/28/12 Assistance Needed: +1    Subjective Data      Cognition  Cognition Arousal/Alertness: Awake/alert Behavior During Therapy: WFL for tasks assessed/performed Overall Cognitive Status: Within Functional Limits for tasks assessed    Balance     End of Session PT - End of Session Equipment Utilized During Treatment: Gait belt;Oxygen Activity Tolerance: Other (comment) (Commented cramps  o2 sat dropped to 83) Patient left: in chair;with family/visitor present   GP  [name] 05/28/2012, 9:10 AM

## 2012-05-28 NOTE — Progress Notes (Signed)
SATURATION QUALIFICATIONS: (This note is used to comply with regulatory documentation for home oxygen)  Patient Saturations on Room Air at Rest = 92%  Patient Saturations on Room Air while Ambulating = 81%  Patient Saturations on 2 Liters of oxygen while Ambulating = 93%  Please briefly explain why patient needs home oxygen:

## 2012-05-28 NOTE — Progress Notes (Addendum)
ANTICOAGULATION CONSULT NOTE - Follow Up Consult  Pharmacy Consult for Lovenox + Coumadin Indication: pulmonary embolus  Allergies  Allergen Reactions  . Ciprofloxacin     RASH  . Codeine     NAUSEA  . Cymbalta (Duloxetine Hcl)     Nausea VOMITING AND ABDOMINAL PAIN, HEADACHE, JUST ABOUT EVERY SIDE EFFECT THE DRUG HAS  . Nortriptyline     Nightmares  . Penicillins     RASH  & ITCHING   --PT STATES SHE CAN TAKE KEFLEX PO AND IV CEPHALOSPORINS  . Statins     Leg cramps  . Tetanus Toxoids     SWELLING, REDNESS  WHOLE ARM    Patient Measurements: Height: 5' 2"  (157.5 cm) Weight: 235 lb 4.8 oz (106.731 kg) IBW/kg (Calculated) : 50.1  Vital Signs: Temp: 97.5 F (36.4 C) (04/17 0700) BP: 144/73 mmHg (04/17 0700) Pulse Rate: 70 (04/17 0700)  Labs:  Recent Labs  05/25/12 0834 05/26/12 0628 05/27/12 0510 05/28/12 0622  HGB  --   --   --  10.0*  HCT  --   --   --  31.9*  PLT  --   --   --  143*  LABPROT  --  22.9* 25.2* 24.4*  INR  --  2.13* 2.42* 2.32*  CREATININE  --  1.73* 1.46*  --   TROPONINI <0.30  --   --   --     Estimated Creatinine Clearance: 38.2 ml/min (by C-G formula based on Cr of 1.46).  Assessment: 77yof on coumadin PTA for hx of PE, s/p left total shoulder arthroplasty on 4/3, presented to [facility] on 4/13 with CP and SOB. CTA positive for B/L PEs. Her coumadin was resumed and lovenox bridge started.Today is day #5 overlap therapy. INR is therapeutic. No CBC today. No bleeding reported.  Coumadin PTA dose (per Med History): 2.8m po daily  She will need at least 5 days of overlap therapy!   Goal of Therapy:  INR 2-3 Anti-Xa level 0.6-1.2 units/ml 4hrs after LMWH dose given Monitor platelets by anticoagulation protocol: Yes   Plan:  1) Coumadin 2.572mx 1 2) Continue lovenox 10545mq q12 3) Follow up INR in AM - if >/= 2 then can d/c lovenox  MarKiley, [place]17/2014,8:33 AM  Dr. OrtOlevia Bowensnts to change lovenox to once daily. Dose will be  160m64m q24 (1.5mg/35msq q24).  MarklTyshawna, [place]/2014, 10:01 AM

## 2012-05-28 NOTE — Discharge Summary (Signed)
Physician Discharge Summary  [name] NFA:[id number] DOB: November 19, 1934 DOA: 05/24/2012  PCP: Horatio Pel, MD  Admit date: 05/24/2012 Discharge date: 05/28/2012  Time spent: 35 minutes  Recommendations for Outpatient Follow-up:  1. Follow up with cardiology in 2 weeks.  Discharge Diagnoses:  Principal Problem:   Acute pulmonary embolism Active Problems:   Other pulmonary embolism and infarction   Benign hypertensive heart disease without heart failure   Dyslipidemia   Acute respiratory failure with hypoxia   Acute on chronic diastolic congestive heart failure   Inadequate anticoagulation   Discharge Condition: stable  Diet recommendation: heart healthy  Filed Weights   05/26/12 0500 05/27/12 0500 05/28/12 0700  Weight: 105.915 kg (233 lb 8 oz) 106.232 kg (234 lb 3.2 oz) 106.731 kg (235 lb 4.8 oz)    History of present illness:  78 y.o. year-old female with history of HTN, HLD, ICA atherosclerosis, OSA, Crohn's disease, who recently underwent left total shoulder arthroplasty by Dr. Onnie Graham on 05/14/12 who presents with SOB and CP. The patient was last at their baseline health except for her shoulder discomfort about 4 days ago. She developed some increased SOB and chest tightness so she took some additional lasix, which helped some for the next few days, however, today, she felt much worse. She initially attributed her symptoms to lyrica, so she stopped taking it on Friday. She has had a nonproductive, hacky cough and she has had some slight wheezing. Last night, she woke up and her right rib cage in the mid-back felt "like it had a catch." She felt like she needed to reposition, which did not help much. This morning, she developed a pain in the left breast and nipple that radiated to the left rib cage, again "like a catch" that was intermittent. Pain lasted for only a few seconds 3/10. Denies tight pressure. She felt flushed and warm last night, but denies diaphoresis and  nausea with the chest pain. She has generally felt unwell.  LEE has been getting progressively worse over the last several weeks, which she attributed to the lyrica, which started 3 weeks ago. She has been using the TED hose and increasing her lasix. + PND worse over the last 10 days. Used 2 pillows at night until last night and last night she had to sit up to breath.  In the ER, she was hypoxic to the mid-80s. Her labs demonstrated stable anemia and thrombocytopenia, elevated pro-BNP. CT chest demonstrated acute on chronic bilateral PE: Bilateral lower lobes, right middle lobe, right upper lobe, and left upper lobe with "patchy areas of consolidation in the RUL, RML, LLL. Stable pulmonary LN.    Hospital Course:  *Acute pulmonary embolism/ Acute respiratory failure with hypoxia  - started Heparin and coumadin overlap for 5 days in hospital. She will give her self one dose of lovenox at home. - pt desating < 88% with ambulation. Will go home on 2L O2. - echo The estimated ejection fraction was in the range of 55% to 60%.  Acute on chronic diastolic congestive heart failure:  - BNP mild elevated.  - given lasix overnight  - cont home medications.  Normocytic anemia/ Thrombocytopenia  - stable. Follow up with PCP.   HTN :  - stable cont to monitor.   Dyslipidemia  - Statins.   Exogenous obesity  - counseling   Procedures: Echo 4.14.2014: The estimated ejection fraction was in the range of 55% to 60%.    Consultations:  none  Discharge Exam: Filed  Vitals:   05/27/12 1012 05/27/12 1400 05/27/12 2129 05/28/12 0700  BP: 137/71 141/72 155/73 144/73  Pulse: 68 85 80 70  Temp:  97.7 F (36.5 C) 97.7 F (36.5 C) 97.5 F (36.4 C)  TempSrc:  Oral    Resp:  18 18 18   Height:      Weight:    106.731 kg (235 lb 4.8 oz)  SpO2:  98% 91% 94%    General: A&O x3 Cardiovascular: RRR Respiratory: good air movement CTA b/l  Discharge Instructions  Discharge Orders   Future  Appointments Provider Department Dept Phone   09/10/2012 2:00 PM [facility] [phone]   09/10/2012 3:00 PM Princess Perna, NP [facility] [phone]   Future Orders Complete By Expires     Diet - low sodium heart healthy  As directed     Increase activity slowly  As directed         Medication List    TAKE these medications       allopurinol 300 MG tablet  Commonly known as:  ZYLOPRIM  Take 300 mg by mouth Daily.     amLODipine 5 MG tablet  Commonly known as:  NORVASC  Take 5 mg by mouth Daily.     calcium citrate-vitamin D 315-200 MG-UNIT per tablet  Commonly known as:  CITRACAL+D  Take 1 tablet by mouth daily.     CELEBREX 200 MG capsule  Generic drug:  celecoxib  Take 200 mg by mouth Daily.     cholecalciferol 1000 UNITS tablet  Commonly known as:  VITAMIN D  Take 3,000 Units by mouth daily.     COMBIGAN OP  Place 1 drop into the right eye 2 (two) times daily. RIGHT EYE     diphenhydrAMINE 25 mg capsule  Commonly known as:  BENADRYL  Take 25 mg by mouth at bedtime as needed for sleep. For sleep     enoxaparin 150 MG/ML injection  Commonly known as:  LOVENOX  Inject 1.06 mLs (160 mg total) into the skin daily.  Start taking on:  05/29/2012     fenofibrate 160 MG tablet  Take 160 mg by mouth every evening.     furosemide 40 MG tablet  Commonly known as:  LASIX  Take 20 mg by mouth daily as needed (for fluid retention).     HYDROcodone-acetaminophen 5-325 MG per tablet  Commonly known as:  NORCO/VICODIN  Take 1 tablet by mouth every 6 (six) hours as needed for pain.     indapamide 2.5 MG tablet  Commonly known as:  LOZOL  Take 2.5 mg by mouth daily.     ketoconazole 2 % cream  Commonly known as:  NIZORAL  Apply 1 application topically 2 (two) times daily as needed.     loratadine 10 MG tablet  Commonly known as:  CLARITIN  Take 10 mg by mouth daily as needed for allergies.  For allergies     Magnesium 400 MG Caps  Take 400 mg by mouth daily.     methocarbamol 500 MG tablet  Commonly known as:  ROBAXIN  Take 1 tablet (500 mg total) by mouth 3 (three) times daily as needed.     metoprolol tartrate 25 MG tablet  Commonly known as:  LOPRESSOR  Take 25 mg by mouth 2 (two) times daily.     multivitamin with minerals tablet  Take 1 tablet by mouth daily.     nitrofurantoin  50 MG capsule  Commonly known as:  MACRODANTIN  Take 50 mg by mouth at bedtime.     omeprazole 40 MG capsule  Commonly known as:  PRILOSEC  Take 40 mg by mouth daily.     oxyCODONE-acetaminophen 5-325 MG per tablet  Commonly known as:  PERCOCET  Take 1-2 tablets by mouth every 4 (four) hours as needed for pain.     warfarin 2.5 MG tablet  Commonly known as:  COUMADIN  Take 2.5 mg by mouth every evening.          The results of significant diagnostics from this hospitalization (including imaging, microbiology, ancillary and laboratory) are listed below for reference.    Significant Diagnostic Studies: Dg Chest 2 View  05/11/2012  *RADIOLOGY REPORT*  Clinical Data: Hypertension  CHEST - 2 VIEW  Comparison: May 06, 2011.  Findings: Stable cardiomegaly.  No acute pulmonary disease is noted.  No pneumothorax or pleural effusion is noted.  Bony thorax is intact.  IMPRESSION: No acute cardiopulmonary abnormality seen.   Original Report Authenticated By: Marijo Conception.,  M.D.    Ct Angio Chest Pe W/cm &/or Wo Cm  05/24/2012  *RADIOLOGY REPORT*  Clinical Data: Shortness of breath and chest pain.  CT ANGIOGRAPHY CHEST  Technique:  Multidetector CT imaging of the chest using the standard protocol during bolus administration of intravenous contrast. Multiplanar reconstructed images including MIPs were obtained and reviewed to evaluate the vascular anatomy.  Contrast: 110m OMNIPAQUE IOHEXOL 350 MG/ML SOLN  Comparison: CT of the chest dated 02/23/2009 and chest x-ray dated 05/24/2012   Findings: There are multiple bilateral pulmonary emboli including both lower lobes, the right middle lobe, in the right upper lobe. There also emboli in the left upper lung.  In addition, the patient has patchy areas of lung consolidation in the right upper lobe and in the right middle lobe. There is also a small peripheral posterior infiltrate at the left base.  These could represent areas of pulmonary infarction.   There is a small benign-appearing intrapulmonary lymph node seen on image number 46 of series 5, unchanged since the prior exam.  There is borderline cardiomegaly.  No acute osseous abnormality. Adenopathy.  IMPRESSION: Multiple bilateral pulmonary emboli. These are acute.  The patient does have a history of prior emboli in the same areas.  Small areas of infiltrate or infarction in the right lung.   Original Report Authenticated By: JLorriane Shire M.D.    Dg Chest Portable 1 View  05/24/2012  *RADIOLOGY REPORT*  Clinical Data: Shortness of breath, chest pain  PORTABLE CHEST - 1 VIEW  Comparison: 05/11/2012  Findings: Cardiomegaly with mild interstitial edema.  Mild patchy left lower lobe opacity, likely atelectasis. No pleural effusion or pneumothorax.  Left shoulder arthroplasty.  IMPRESSION: Cardiomegaly with mild interstitial edema.   Original Report Authenticated By: SJulian Hy M.D.     Microbiology: No results found for this or any previous visit (from the past 240 hour(s)).   Labs: Basic Metabolic Panel:  Recent Labs Lab 05/24/12 1457 05/25/12 0321 05/26/12 0628 05/27/12 0510  NA 143 142 143 142  K 3.6 3.3* 3.7 3.6  CL 104 101 103 104  CO2 29 27 28 28   GLUCOSE 125* 147* 126* 135*  BUN 21 17 33* 38*  CREATININE 0.91 0.93 1.73* 1.46*  CALCIUM 8.6 8.8 8.1* 7.9*   Liver Function Tests:  Recent Labs Lab 05/24/12 1457  AST 19  ALT 17  ALKPHOS 93  BILITOT 0.5  PROT 6.5  ALBUMIN 2.9*   No results found for this basename: LIPASE, AMYLASE,  in the last 168  hours No results found for this basename: AMMONIA,  in the last 168 hours CBC:  Recent Labs Lab 05/24/12 1457 05/25/12 0321 05/28/12 0622  WBC 9.4 9.3 4.7  HGB 10.3* 10.5* 10.0*  HCT 32.7* 32.5* 31.9*  MCV 85.4 85.1 84.6  PLT 121* 132* 143*   Cardiac Enzymes:  Recent Labs Lab 05/24/12 2124 05/25/12 0321 05/25/12 0834  TROPONINI <0.30 <0.30 <0.30   BNP: BNP (last 3 results)  Recent Labs  05/24/12 1457  PROBNP 1433.0*   CBG: No results found for this basename: GLUCAP,  in the last 168 hours     Signed:  Charlynne Cousins  [facility] 05/28/2012, 9:58 AM

## 2012-05-29 ENCOUNTER — Ambulatory Visit: Payer: Self-pay | Admitting: Internal Medicine

## 2012-05-29 ENCOUNTER — Telehealth: Payer: Self-pay | Admitting: Cardiology

## 2012-05-29 DIAGNOSIS — IMO0001 Reserved for inherently not codable concepts without codable children: Secondary | ICD-10-CM | POA: Diagnosis not present

## 2012-05-29 DIAGNOSIS — I2699 Other pulmonary embolism without acute cor pulmonale: Secondary | ICD-10-CM

## 2012-05-29 DIAGNOSIS — Z5181 Encounter for therapeutic drug level monitoring: Secondary | ICD-10-CM | POA: Diagnosis not present

## 2012-05-29 DIAGNOSIS — Z471 Aftercare following joint replacement surgery: Secondary | ICD-10-CM | POA: Diagnosis not present

## 2012-05-29 DIAGNOSIS — Z7901 Long term (current) use of anticoagulants: Secondary | ICD-10-CM | POA: Diagnosis not present

## 2012-05-29 DIAGNOSIS — Z96619 Presence of unspecified artificial shoulder joint: Secondary | ICD-10-CM | POA: Diagnosis not present

## 2012-05-29 LAB — POCT INR: INR: 3

## 2012-05-29 NOTE — Telephone Encounter (Signed)
New Problem:    Called in to wanting an order to give the patient her last Lovenox shot because her INR is 3.0.  Please call back.

## 2012-05-29 NOTE — Telephone Encounter (Signed)
Came home yesterday from hospital with dx of bilateral PE, on O2 2Lit cont, [facility] in for home health, INR 3.0 today, OT and PT added as disciplines for home health, stop lovenox, reck INR 06/02/2012, Cont 2.5 MG coumadin,will open coumadin clinic encounter.

## 2012-06-02 ENCOUNTER — Ambulatory Visit (INDEPENDENT_AMBULATORY_CARE_PROVIDER_SITE_OTHER): Payer: Medicare Other | Admitting: Cardiology

## 2012-06-02 DIAGNOSIS — Z96619 Presence of unspecified artificial shoulder joint: Secondary | ICD-10-CM | POA: Diagnosis not present

## 2012-06-02 DIAGNOSIS — Z471 Aftercare following joint replacement surgery: Secondary | ICD-10-CM | POA: Diagnosis not present

## 2012-06-02 DIAGNOSIS — I2699 Other pulmonary embolism without acute cor pulmonale: Secondary | ICD-10-CM

## 2012-06-02 DIAGNOSIS — IMO0001 Reserved for inherently not codable concepts without codable children: Secondary | ICD-10-CM | POA: Diagnosis not present

## 2012-06-02 DIAGNOSIS — Z5181 Encounter for therapeutic drug level monitoring: Secondary | ICD-10-CM | POA: Diagnosis not present

## 2012-06-02 DIAGNOSIS — Z7901 Long term (current) use of anticoagulants: Secondary | ICD-10-CM | POA: Diagnosis not present

## 2012-06-02 LAB — POCT INR: INR: 3.3

## 2012-06-04 DIAGNOSIS — Z471 Aftercare following joint replacement surgery: Secondary | ICD-10-CM | POA: Diagnosis not present

## 2012-06-04 DIAGNOSIS — Z96619 Presence of unspecified artificial shoulder joint: Secondary | ICD-10-CM | POA: Diagnosis not present

## 2012-06-04 DIAGNOSIS — Z7901 Long term (current) use of anticoagulants: Secondary | ICD-10-CM | POA: Diagnosis not present

## 2012-06-04 DIAGNOSIS — IMO0001 Reserved for inherently not codable concepts without codable children: Secondary | ICD-10-CM | POA: Diagnosis not present

## 2012-06-04 DIAGNOSIS — Z5181 Encounter for therapeutic drug level monitoring: Secondary | ICD-10-CM | POA: Diagnosis not present

## 2012-06-05 DIAGNOSIS — Z4789 Encounter for other orthopedic aftercare: Secondary | ICD-10-CM | POA: Diagnosis not present

## 2012-06-07 DIAGNOSIS — Z471 Aftercare following joint replacement surgery: Secondary | ICD-10-CM | POA: Diagnosis not present

## 2012-06-07 DIAGNOSIS — IMO0001 Reserved for inherently not codable concepts without codable children: Secondary | ICD-10-CM | POA: Diagnosis not present

## 2012-06-07 DIAGNOSIS — Z7901 Long term (current) use of anticoagulants: Secondary | ICD-10-CM | POA: Diagnosis not present

## 2012-06-07 DIAGNOSIS — Z96619 Presence of unspecified artificial shoulder joint: Secondary | ICD-10-CM | POA: Diagnosis not present

## 2012-06-07 DIAGNOSIS — Z5181 Encounter for therapeutic drug level monitoring: Secondary | ICD-10-CM | POA: Diagnosis not present

## 2012-06-08 DIAGNOSIS — Z7901 Long term (current) use of anticoagulants: Secondary | ICD-10-CM | POA: Diagnosis not present

## 2012-06-08 DIAGNOSIS — Z5181 Encounter for therapeutic drug level monitoring: Secondary | ICD-10-CM | POA: Diagnosis not present

## 2012-06-08 DIAGNOSIS — Z96619 Presence of unspecified artificial shoulder joint: Secondary | ICD-10-CM | POA: Diagnosis not present

## 2012-06-08 DIAGNOSIS — IMO0001 Reserved for inherently not codable concepts without codable children: Secondary | ICD-10-CM | POA: Diagnosis not present

## 2012-06-08 DIAGNOSIS — Z471 Aftercare following joint replacement surgery: Secondary | ICD-10-CM | POA: Diagnosis not present

## 2012-06-09 ENCOUNTER — Ambulatory Visit (INDEPENDENT_AMBULATORY_CARE_PROVIDER_SITE_OTHER): Payer: Medicare Other | Admitting: Cardiovascular Disease

## 2012-06-09 DIAGNOSIS — Z7901 Long term (current) use of anticoagulants: Secondary | ICD-10-CM | POA: Diagnosis not present

## 2012-06-09 DIAGNOSIS — Z5181 Encounter for therapeutic drug level monitoring: Secondary | ICD-10-CM | POA: Diagnosis not present

## 2012-06-09 DIAGNOSIS — IMO0001 Reserved for inherently not codable concepts without codable children: Secondary | ICD-10-CM | POA: Diagnosis not present

## 2012-06-09 DIAGNOSIS — I2699 Other pulmonary embolism without acute cor pulmonale: Secondary | ICD-10-CM

## 2012-06-09 DIAGNOSIS — Z96619 Presence of unspecified artificial shoulder joint: Secondary | ICD-10-CM | POA: Diagnosis not present

## 2012-06-09 DIAGNOSIS — Z471 Aftercare following joint replacement surgery: Secondary | ICD-10-CM | POA: Diagnosis not present

## 2012-06-18 ENCOUNTER — Ambulatory Visit (INDEPENDENT_AMBULATORY_CARE_PROVIDER_SITE_OTHER): Payer: Medicare Other | Admitting: Cardiology

## 2012-06-18 DIAGNOSIS — Z5181 Encounter for therapeutic drug level monitoring: Secondary | ICD-10-CM | POA: Diagnosis not present

## 2012-06-18 DIAGNOSIS — IMO0001 Reserved for inherently not codable concepts without codable children: Secondary | ICD-10-CM | POA: Diagnosis not present

## 2012-06-18 DIAGNOSIS — Z7901 Long term (current) use of anticoagulants: Secondary | ICD-10-CM | POA: Diagnosis not present

## 2012-06-18 DIAGNOSIS — Z96619 Presence of unspecified artificial shoulder joint: Secondary | ICD-10-CM | POA: Diagnosis not present

## 2012-06-18 DIAGNOSIS — I2699 Other pulmonary embolism without acute cor pulmonale: Secondary | ICD-10-CM

## 2012-06-18 DIAGNOSIS — Z471 Aftercare following joint replacement surgery: Secondary | ICD-10-CM | POA: Diagnosis not present

## 2012-06-23 DIAGNOSIS — M1710 Unilateral primary osteoarthritis, unspecified knee: Secondary | ICD-10-CM | POA: Diagnosis not present

## 2012-06-23 DIAGNOSIS — M169 Osteoarthritis of hip, unspecified: Secondary | ICD-10-CM | POA: Diagnosis not present

## 2012-06-25 DIAGNOSIS — Z96619 Presence of unspecified artificial shoulder joint: Secondary | ICD-10-CM | POA: Diagnosis not present

## 2012-06-25 DIAGNOSIS — IMO0001 Reserved for inherently not codable concepts without codable children: Secondary | ICD-10-CM | POA: Diagnosis not present

## 2012-06-25 DIAGNOSIS — Z7901 Long term (current) use of anticoagulants: Secondary | ICD-10-CM | POA: Diagnosis not present

## 2012-06-25 DIAGNOSIS — Z471 Aftercare following joint replacement surgery: Secondary | ICD-10-CM | POA: Diagnosis not present

## 2012-06-25 DIAGNOSIS — Z5181 Encounter for therapeutic drug level monitoring: Secondary | ICD-10-CM | POA: Diagnosis not present

## 2012-06-29 DIAGNOSIS — H4011X0 Primary open-angle glaucoma, stage unspecified: Secondary | ICD-10-CM | POA: Diagnosis not present

## 2012-06-29 DIAGNOSIS — H409 Unspecified glaucoma: Secondary | ICD-10-CM | POA: Diagnosis not present

## 2012-07-07 ENCOUNTER — Other Ambulatory Visit: Payer: Self-pay | Admitting: *Deleted

## 2012-07-07 MED ORDER — WARFARIN SODIUM 2.5 MG PO TABS: 2.5000 mg | ORAL_TABLET | ORAL | Status: DC

## 2012-07-09 ENCOUNTER — Ambulatory Visit (INDEPENDENT_AMBULATORY_CARE_PROVIDER_SITE_OTHER): Payer: Medicare Other | Admitting: Pharmacist

## 2012-07-09 DIAGNOSIS — I2699 Other pulmonary embolism without acute cor pulmonale: Secondary | ICD-10-CM

## 2012-07-24 ENCOUNTER — Ambulatory Visit (INDEPENDENT_AMBULATORY_CARE_PROVIDER_SITE_OTHER): Payer: Medicare Other

## 2012-07-24 DIAGNOSIS — I2699 Other pulmonary embolism without acute cor pulmonale: Secondary | ICD-10-CM

## 2012-07-27 ENCOUNTER — Other Ambulatory Visit: Payer: Self-pay

## 2012-07-27 DIAGNOSIS — Z1231 Encounter for screening mammogram for malignant neoplasm of breast: Secondary | ICD-10-CM

## 2012-08-03 DIAGNOSIS — L02619 Cutaneous abscess of unspecified foot: Secondary | ICD-10-CM | POA: Diagnosis not present

## 2012-08-03 DIAGNOSIS — L03039 Cellulitis of unspecified toe: Secondary | ICD-10-CM | POA: Diagnosis not present

## 2012-08-03 DIAGNOSIS — S90129A Contusion of unspecified lesser toe(s) without damage to nail, initial encounter: Secondary | ICD-10-CM | POA: Diagnosis not present

## 2012-08-13 DIAGNOSIS — M109 Gout, unspecified: Secondary | ICD-10-CM | POA: Diagnosis not present

## 2012-08-13 DIAGNOSIS — IMO0001 Reserved for inherently not codable concepts without codable children: Secondary | ICD-10-CM | POA: Diagnosis not present

## 2012-08-13 DIAGNOSIS — M1990 Unspecified osteoarthritis, unspecified site: Secondary | ICD-10-CM | POA: Diagnosis not present

## 2012-08-21 ENCOUNTER — Ambulatory Visit (INDEPENDENT_AMBULATORY_CARE_PROVIDER_SITE_OTHER): Payer: Medicare Other | Admitting: *Deleted

## 2012-08-21 DIAGNOSIS — I2699 Other pulmonary embolism without acute cor pulmonale: Secondary | ICD-10-CM | POA: Diagnosis not present

## 2012-09-03 ENCOUNTER — Ambulatory Visit: Payer: Medicare Other | Admitting: Cardiology

## 2012-09-07 ENCOUNTER — Ambulatory Visit
Admission: RE | Admit: 2012-09-07 | Discharge: 2012-09-07 | Disposition: A | Discharge status: Home / Self Care | Payer: Medicare Other | Source: Ambulatory Visit

## 2012-09-07 DIAGNOSIS — Z1231 Encounter for screening mammogram for malignant neoplasm of breast: Secondary | ICD-10-CM

## 2012-09-09 DIAGNOSIS — Z96619 Presence of unspecified artificial shoulder joint: Secondary | ICD-10-CM | POA: Diagnosis not present

## 2012-09-10 ENCOUNTER — Ambulatory Visit: Payer: Medicare Other | Admitting: Neurosurgery

## 2012-09-10 ENCOUNTER — Other Ambulatory Visit (INDEPENDENT_AMBULATORY_CARE_PROVIDER_SITE_OTHER): Payer: Medicare Other | Admitting: *Deleted

## 2012-09-10 DIAGNOSIS — I6529 Occlusion and stenosis of unspecified carotid artery: Secondary | ICD-10-CM

## 2012-09-11 ENCOUNTER — Other Ambulatory Visit: Payer: Self-pay | Admitting: *Deleted

## 2012-09-14 DIAGNOSIS — L97509 Non-pressure chronic ulcer of other part of unspecified foot with unspecified severity: Secondary | ICD-10-CM | POA: Diagnosis not present

## 2012-09-16 ENCOUNTER — Encounter: Payer: Self-pay | Admitting: Vascular Surgery

## 2012-09-21 DIAGNOSIS — L97509 Non-pressure chronic ulcer of other part of unspecified foot with unspecified severity: Secondary | ICD-10-CM | POA: Diagnosis not present

## 2012-09-23 ENCOUNTER — Ambulatory Visit: Payer: Medicare Other | Admitting: Cardiology

## 2012-09-24 ENCOUNTER — Ambulatory Visit (INDEPENDENT_AMBULATORY_CARE_PROVIDER_SITE_OTHER): Payer: Medicare Other | Admitting: *Deleted

## 2012-09-24 DIAGNOSIS — I2699 Other pulmonary embolism without acute cor pulmonale: Secondary | ICD-10-CM

## 2012-09-24 LAB — POCT INR: INR: 2.5

## 2012-09-28 DIAGNOSIS — N3020 Other chronic cystitis without hematuria: Secondary | ICD-10-CM | POA: Diagnosis not present

## 2012-09-28 DIAGNOSIS — R300 Dysuria: Secondary | ICD-10-CM | POA: Diagnosis not present

## 2012-10-05 DIAGNOSIS — L97509 Non-pressure chronic ulcer of other part of unspecified foot with unspecified severity: Secondary | ICD-10-CM | POA: Diagnosis not present

## 2012-10-08 DIAGNOSIS — R0609 Other forms of dyspnea: Secondary | ICD-10-CM | POA: Diagnosis not present

## 2012-10-08 DIAGNOSIS — I10 Essential (primary) hypertension: Secondary | ICD-10-CM | POA: Diagnosis not present

## 2012-10-08 DIAGNOSIS — G4700 Insomnia, unspecified: Secondary | ICD-10-CM | POA: Diagnosis not present

## 2012-10-13 DIAGNOSIS — Z23 Encounter for immunization: Secondary | ICD-10-CM | POA: Diagnosis not present

## 2012-10-19 DIAGNOSIS — L97509 Non-pressure chronic ulcer of other part of unspecified foot with unspecified severity: Secondary | ICD-10-CM | POA: Diagnosis not present

## 2012-10-20 ENCOUNTER — Ambulatory Visit (INDEPENDENT_AMBULATORY_CARE_PROVIDER_SITE_OTHER): Payer: Medicare Other | Admitting: Cardiology

## 2012-10-20 ENCOUNTER — Encounter: Payer: Self-pay | Admitting: Cardiology

## 2012-10-20 VITALS — BP 158/62 | HR 70 | Ht 62.0 in | Wt 240.0 lb

## 2012-10-20 DIAGNOSIS — E669 Obesity, unspecified: Secondary | ICD-10-CM

## 2012-10-20 DIAGNOSIS — R5381 Other malaise: Secondary | ICD-10-CM

## 2012-10-20 DIAGNOSIS — R0609 Other forms of dyspnea: Secondary | ICD-10-CM

## 2012-10-20 DIAGNOSIS — I119 Hypertensive heart disease without heart failure: Secondary | ICD-10-CM

## 2012-10-20 DIAGNOSIS — E6609 Other obesity due to excess calories: Secondary | ICD-10-CM

## 2012-10-20 NOTE — Progress Notes (Signed)
[name] Date of Birth:  18-Jan-1935 [facility] [address] [place], [place]  [address] [phone]         Fax   [phone]  History of Present Illness: This pleasant 78 year old woman is seen for a scheduled four-month followup office visit. Since we last saw her she had successful left hip surgery by Dr. Wynelle Link. Following surgery she went to [facility] place for 4 weeks for intensive physical therapy. The patient did not have any postoperative cardiovascular complications. Specifically there was no evidence of any recurrent pulmonary emboli. The patient remains on long-term Coumadin. Her INR has been stable. She has a history of essential hypertension and a history of right carotid artery occlusion. She has a past history of pulmonary emboli in January 2011 after right hip surgery and at that time venous Dopplers of the legs were negative for a source of embolization and she has remained on long-term Coumadin. 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 Donnetta Hutching. She does not have any significant coronary disease and she had cardiac catheterization in 1994 showing only mild plaque and she had a nuclear stress test in December 2011 showing no ischemia and her ejection fraction was 78%.  Earlier this year she had left total shoulder replacement surgery by Dr. Onnie Graham. She has not felt as well over the past several months.  She wonders if it is because she had 2 major surgeries within a year.  She is also concerned about slow healing of a foot lesion and notes that Dr. Shelia Media is going to recheck her for diabetes.  Current Outpatient Prescriptions  Medication Sig Dispense Refill  . allopurinol (ZYLOPRIM) 300 MG tablet Take 300 mg by mouth Daily.       [name] Kitchen amLODipine (NORVASC) 5 MG tablet Take 5 mg by mouth Daily.       . Brimonidine Tartrate-Timolol (COMBIGAN OP) Place 1 drop into the right eye 2 (two)  times daily. RIGHT EYE      . calcium citrate-vitamin D (CITRACAL+D) 315-200 MG-UNIT per tablet Take 1 tablet by mouth daily.      . CELEBREX 200 MG capsule Take 200 mg by mouth Daily.      . cetirizine (ZYRTEC) 10 MG tablet Take 10 mg by mouth daily.      . cholecalciferol (VITAMIN D) 1000 UNITS tablet Take 3,000 Units by mouth daily.      . diphenhydrAMINE (BENADRYL) 25 mg capsule Take 25 mg by mouth at bedtime as needed for sleep. For sleep      . eszopiclone (LUNESTA) 2 MG TABS tablet       . fenofibrate 160 MG tablet Take 160 mg by mouth every evening.       . furosemide (LASIX) 40 MG tablet Take 20 mg by mouth daily as needed (for fluid retention).      [name] Kitchen HYDROcodone-acetaminophen (NORCO/VICODIN) 5-325 MG per tablet Take 1 tablet by mouth every 6 (six) hours as needed for pain.      . indapamide (LOZOL) 1.25 MG tablet Take 1.25 mg by mouth every morning.      [name] Kitchen ketoconazole (NIZORAL) 2 % cream Apply 1 application topically 2 (two) times daily as needed.       . Magnesium 400 MG CAPS Take 400 mg by mouth daily.       . metoprolol tartrate (LOPRESSOR) 25 MG tablet Take 25 mg by  mouth as directed. 1/2 TABLET TWICE A DAY      . Multiple Vitamins-Minerals (MULTIVITAMIN WITH MINERALS) tablet Take 1 tablet by mouth daily.      [name] Kitchen omeprazole (PRILOSEC) 40 MG capsule Take 40 mg by mouth daily.       [name] Kitchen warfarin (COUMADIN) 2.5 MG tablet Take 1 tablet (2.5 mg total) by mouth as directed.  30 tablet  3  . [DISCONTINUED] amitriptyline (ELAVIL) 10 MG tablet Take 10 mg by mouth at bedtime.        No current facility-administered medications for this visit.    Allergies  Allergen Reactions  . Ciprofloxacin     RASH  . Codeine     NAUSEA  . Cymbalta [Duloxetine Hcl]     Nausea VOMITING AND ABDOMINAL PAIN, HEADACHE, JUST ABOUT EVERY SIDE EFFECT THE DRUG HAS  . Nortriptyline     Nightmares  . Penicillins     RASH  & ITCHING   --PT STATES SHE CAN TAKE KEFLEX PO AND IV CEPHALOSPORINS  . Statins      Leg cramps  . Tetanus Toxoids     SWELLING, REDNESS  WHOLE ARM    Patient Active Problem List   Diagnosis Date Noted  . Other pulmonary embolism and infarction 05/28/2010    Priority: High  . Acute pulmonary embolism 05/24/2012  . Acute respiratory failure with hypoxia 05/24/2012  . Wheezing 05/24/2012  . Acute on chronic diastolic congestive heart failure 05/24/2012  . Normocytic anemia 05/24/2012  . Thrombocytopenia 05/24/2012  . Inadequate anticoagulation 05/24/2012  . Elevated brain natriuretic peptide (BNP) level 05/24/2012  . Shoulder arthritis 05/15/2012  . Occlusion and stenosis of carotid artery without mention of cerebral infarction 09/10/2011  . Postop Acute blood loss anemia 06/20/2011  . OA (osteoarthritis) of hip 06/17/2011  . Osteoarthritis 01/08/2011  . Benign hypertensive heart disease without heart failure 08/06/2010  . Fibromyalgia 08/06/2010  . Dyslipidemia 08/06/2010  . Gout 08/06/2010  . Exogenous obesity 08/06/2010  . Right carotid artery occlusion 08/06/2010    History  Smoking status  . Former Smoker -- 0.50 packs/day for 15 years  . Types: Cigarettes  . Quit date: 02/11/1986  Smokeless tobacco  . Never Used    History  Alcohol Use  . Yes    Comment: 05/14/2012 "have 2-3 drinks/yr" maybe    Family History  Problem Relation Age of Onset  . Heart disease Father     Heart Disease before age 75  . Heart attack Father 68  . Hypertension Father   . Peripheral vascular disease Father     Right  leg amputation  . Heart disease Mother   . Stroke Mother   . Aneurysm Mother   . Hypertension Mother   . Hypertension Brother   . Hypertension Brother   . Diabetes Paternal Grandfather     Review of Systems: Constitutional: no fever chills diaphoresis or fatigue or change in weight.  Head and neck: no hearing loss, no epistaxis, no photophobia or visual disturbance. Respiratory: No cough, shortness of breath or wheezing. Cardiovascular: No chest  pain peripheral edema, palpitations. Gastrointestinal: No abdominal distention, no abdominal pain, no change in bowel habits hematochezia or melena. Genitourinary: No dysuria, no frequency, no urgency, no nocturia. Musculoskeletal:No arthralgias, no back pain, no gait disturbance or myalgias. Neurological: No dizziness, no headaches, no numbness, no seizures, no syncope, no weakness, no tremors. Hematologic: No lymphadenopathy, no easy bruising. Psychiatric: No confusion, no hallucinations, no sleep disturbance.    Physical  Exam: Filed Vitals:   10/20/12 1538  BP: 158/62  Pulse: 70   the general appearance reveals a large middle-aged woman in no distress.The head and neck exam reveals pupils equal and reactive.  Extraocular movements are full.  There is no scleral icterus.  The mouth and pharynx are normal.  The neck is supple.  The carotids reveal no bruits.  The jugular venous pressure is normal.  The  thyroid is not enlarged.  There is no lymphadenopathy.  The chest is clear to percussion and auscultation.  There are no rales or rhonchi.  Expansion of the chest is symmetrical.  The precordium is quiet.  The first heart sound is normal.  The second heart sound is physiologically split.  There is no murmur gallop rub or click.  There is no abnormal lift or heave.  The abdomen is soft and nontender.  The bowel sounds are normal.  The liver and spleen are not enlarged.  There are no abdominal masses.  There are no abdominal bruits.  Extremities reveal good pedal pulses.  There is no phlebitis or edema.  There is no cyanosis or clubbing.  Strength is normal and symmetrical in all extremities.  There is no lateralizing weakness.  There are no sensory deficits.  The skin is warm and dry.  There is no rash.     Assessment / Plan: Continue on same medication except decreased Lopressor down to 12.5 mg twice a day. Recheck here in 4 months for followup office visit.  Continue efforts at weight  loss.

## 2012-10-20 NOTE — Patient Instructions (Addendum)
DECREASE YOUR METOPROLOL TO 12.5 MG TWICE A DAY (1/2 OF A 25 MG )  Your physician wants you to follow-up in: [place] will receive a reminder letter in the mail two months in advance. If you don't receive a letter, please call our office to schedule the follow-up appointment.

## 2012-10-20 NOTE — Assessment & Plan Note (Signed)
Blood pressure has been stable on current therapy.  We are reducing metoprolol because of possible side effects of malaise and fatigue

## 2012-10-20 NOTE — Assessment & Plan Note (Signed)
The patient complains of lack of energy.  She stays tired all the time.  She doesn't feel like doing anything she is happy to stay home.  She does not think that she is depressed.  She has been more short of breath and she has an appointment to see pulmonary in several weeks.  She has a history of remote pulmonary emboli and has been on long-term Coumadin.  She feels worse after she takes her morning medications.  She is suspicious that the metoprolol may be the culprit.  We will decrease her metoprolol tartrate to just 12.5 mg twice a day and observe response

## 2012-10-20 NOTE — Assessment & Plan Note (Signed)
The patient's weight is unchanged at 240 pounds.  She is having exertional dyspnea some of which may be related to her overweight status.  I have encouraged her to continue to try to lose weight

## 2012-11-02 DIAGNOSIS — L97509 Non-pressure chronic ulcer of other part of unspecified foot with unspecified severity: Secondary | ICD-10-CM | POA: Diagnosis not present

## 2012-11-03 DIAGNOSIS — G4700 Insomnia, unspecified: Secondary | ICD-10-CM | POA: Diagnosis not present

## 2012-11-03 DIAGNOSIS — R7309 Other abnormal glucose: Secondary | ICD-10-CM | POA: Diagnosis not present

## 2012-11-05 ENCOUNTER — Ambulatory Visit (INDEPENDENT_AMBULATORY_CARE_PROVIDER_SITE_OTHER): Payer: Medicare Other | Admitting: *Deleted

## 2012-11-05 DIAGNOSIS — I2699 Other pulmonary embolism without acute cor pulmonale: Secondary | ICD-10-CM

## 2012-11-08 ENCOUNTER — Other Ambulatory Visit: Payer: Self-pay | Admitting: Cardiology

## 2012-11-17 DIAGNOSIS — M25569 Pain in unspecified knee: Secondary | ICD-10-CM | POA: Diagnosis not present

## 2012-11-17 DIAGNOSIS — IMO0002 Reserved for concepts with insufficient information to code with codable children: Secondary | ICD-10-CM | POA: Diagnosis not present

## 2012-11-20 ENCOUNTER — Ambulatory Visit (INDEPENDENT_AMBULATORY_CARE_PROVIDER_SITE_OTHER): Payer: Medicare Other | Admitting: Pulmonary Disease

## 2012-11-20 ENCOUNTER — Encounter: Payer: Self-pay | Admitting: Pulmonary Disease

## 2012-11-20 VITALS — BP 124/84 | HR 70 | Ht 62.0 in | Wt 235.0 lb

## 2012-11-20 DIAGNOSIS — R0609 Other forms of dyspnea: Secondary | ICD-10-CM

## 2012-11-20 DIAGNOSIS — R0600 Dyspnea, unspecified: Secondary | ICD-10-CM

## 2012-11-20 NOTE — Progress Notes (Deleted)
  Subjective:    Patient ID: [name], female    DOB: 1934-07-07, 78 y.o.   MRN: [id number]  HPI    Review of Systems  Constitutional: Negative for fever, chills, diaphoresis, activity change, appetite change, fatigue and unexpected weight change.  HENT: Positive for trouble swallowing. Negative for congestion, dental problem, ear discharge, ear pain, facial swelling, hearing loss, mouth sores, nosebleeds, postnasal drip, rhinorrhea, sinus pressure, sneezing, sore throat, tinnitus and voice change.   Eyes: Negative for photophobia, discharge, itching and visual disturbance.  Respiratory: Positive for cough and shortness of breath. Negative for apnea, choking, chest tightness, wheezing and stridor.   Cardiovascular: Negative for chest pain, palpitations and leg swelling.  Gastrointestinal: Negative for nausea, vomiting, abdominal pain, constipation, blood in stool and abdominal distention.       Heartburn  Genitourinary: Negative for dysuria, urgency, frequency, hematuria, flank pain, decreased urine volume and difficulty urinating.  Musculoskeletal: Positive for arthralgias, joint swelling and myalgias. Negative for back pain, gait problem, neck pain and neck stiffness.  Skin: Negative for color change, pallor and rash.  Neurological: Negative for dizziness, tremors, seizures, syncope, speech difficulty, weakness, light-headedness, numbness and headaches.  Hematological: Negative for adenopathy. Does not bruise/bleed easily.  Psychiatric/Behavioral: Negative for confusion, sleep disturbance and agitation. The patient is not nervous/anxious.        Objective:   Physical Exam        Assessment & Plan:

## 2012-11-20 NOTE — Progress Notes (Signed)
Chief Complaint  Patient presents with  . Pulmonary Consult    referred by Dr. Shelia Media for SOB.    History of Present Illness: [name] is a 78 y.o. female with remote hx of smoking for evaluation of dyspnea.  She had shoulder surgery in April 2014.  Afterward she was found to have acute PE.  She has prior episode of PE in 2011 after Rt femur fracture.  She has remained on coumadin therapy.  She needed supplemental oxygen after d/c from hospital in April 2014, but has not needed supplemental oxygen for the past several months.  In spite of no longer needing supplemental oxygen, her breathing has never returned to baseline.  Her symptoms seemed particular bad over the past several weeks.  She was also feeling light-headed and nauseous with activity.  She was seen by Dr. Mare Ferrari with cardiology, and had her dose of lopressor decreased.  She feels this has improved her symptoms, and her breathing has improved some also.  She admits to not being very active since her shoulder surgery and may have gained weight.  She quit smoking about 25 years ago.  She smoked 1/2 pack per day.  She has never been told she has asthma or emphysema.  She does get occasional allergies, especially in the Fall.  She takes zyrtec as needed, and this helps.  She gets sinus congestion, post-nasal drip, and cough with clear sputum in the morning when her allergies as troublesome.  She is not having wheeze, chest pain, or palpitations.  Her breathing is okay at rest, and when she does activity at steady pace.  She gets winded when she has to rush her activities.  She had pneumonia years ago.  There is no history of exposure to tuberculosis.  She gets bronchitis when she gets sinus infections >> not had this happen recently.  She is from [place].  She does not have any animal exposures.  She denies occupational exposures.  She has history of sleep apnea and has been using CPAP.  Tests: CT chest 05/24/12 >> b/l  PE Echo 05/25/12 >> EF 55 to 60%   DESERAE JENNINGS  has a past medical history of Hypertension; Gout; Tinnitus; Acid reflux disease; Pulmonary embolism (2011); Vertigo; Crohn disease; Long term current use of anticoagulant; GERD (gastroesophageal reflux disease); Recurrent upper respiratory infection (URI); H/O hiatal hernia; PVC (premature ventricular contraction); PONV (postoperative nausea and vomiting); Peripheral vascular disease; Fibromyalgia; High triglycerides; Exertional shortness of breath; Chronic bronchitis; Obstructive sleep apnea on CPAP; Crohn's disease; History of stomach ulcers (1950's); Osteoarthritis; and Recurrent UTI (urinary tract infection).  MARIESA GRIEDER  has past surgical history that includes Femur fracture surgery (Right, 02/21/09); Hammer toe surgery (Left, 10/25/08); Back surgery (10/29/06); Knee arthroscopy (Right, 07/26/05); Total hip arthroplasty (Right, 12/08/02); Excision Morton's neuroma (Right, 05/20/00); Total hip arthroplasty (06/17/2011); Joint replacement (2009); Joint replacement (2012); Joint replacement (06/17/11); Fracture surgery (2011); Total shoulder arthroplasty (Left, 05/14/2012); Tonsillectomy and adenoidectomy (1958?); Appendectomy (1950's); Cholecystectomy (~ 1988); Vaginal hysterectomy (1971); Dilation and curettage of uterus; Decompressive lumbar laminectomy level 3 (~ 2002); Replacement total knee (Right, 02/19/2010); Cardiac catheterization (1970's); Cardiac catheterization; Cataract extraction w/ intraocular lens implant (Right, 2009); and Total shoulder arthroplasty (Left, 05/14/2012).  Prior to Admission medications   Medication Sig Start Date End Date Taking? Authorizing Provider  allopurinol (ZYLOPRIM) 300 MG tablet Take 300 mg by mouth Daily.  05/10/10  Yes Historical Provider, MD  amLODipine (NORVASC) 5 MG tablet Take 5 mg by  mouth Daily.  06/08/10  Yes Historical Provider, MD  Brimonidine Tartrate-Timolol (COMBIGAN OP) Place 1 drop into the right eye 2 (two)  times daily. RIGHT EYE   Yes Historical Provider, MD  calcium citrate-vitamin D (CITRACAL+D) 315-200 MG-UNIT per tablet Take 1 tablet by mouth daily.   Yes Historical Provider, MD  CELEBREX 200 MG capsule Take 200 mg by mouth Daily. 08/24/11  Yes Historical Provider, MD  cetirizine (ZYRTEC) 10 MG tablet Take 10 mg by mouth daily.   Yes Historical Provider, MD  cholecalciferol (VITAMIN D) 1000 UNITS tablet Take 3,000 Units by mouth daily.   Yes Historical Provider, MD  diphenhydrAMINE (BENADRYL) 25 mg capsule Take 25 mg by mouth at bedtime as needed for sleep. For sleep   Yes Historical Provider, MD  fenofibrate 160 MG tablet Take 160 mg by mouth every evening.  05/10/10  Yes Historical Provider, MD  furosemide (LASIX) 40 MG tablet Take 20 mg by mouth daily as needed (for fluid retention).   Yes Historical Provider, MD  HYDROcodone-acetaminophen (NORCO/VICODIN) 5-325 MG per tablet Take 1 tablet by mouth every 6 (six) hours as needed for pain.   Yes Historical Provider, MD  indapamide (LOZOL) 1.25 MG tablet Take 1.25 mg by mouth every morning.   Yes Historical Provider, MD  ketoconazole (NIZORAL) 2 % cream Apply 1 application topically 2 (two) times daily as needed.  09/26/11  Yes Historical Provider, MD  Magnesium 400 MG CAPS Take 400 mg by mouth daily.  02/19/11  Yes Historical Provider, MD  metoprolol tartrate (LOPRESSOR) 25 MG tablet Take 25 mg by mouth as directed. 1/2 TABLET TWICE A DAY 05/10/10  Yes Historical Provider, MD  Multiple Vitamins-Minerals (MULTIVITAMIN WITH MINERALS) tablet Take 1 tablet by mouth daily.   Yes Historical Provider, MD  omeprazole (PRILOSEC) 40 MG capsule Take 40 mg by mouth daily.  06/08/10  Yes Historical Provider, MD  warfarin (COUMADIN) 2.5 MG tablet TAKE (1) TABLET AS DIRECTED. 11/08/12  Yes Darlin Coco, MD    Allergies  Allergen Reactions  . Ciprofloxacin     RASH  . Codeine     NAUSEA  . Cymbalta [Duloxetine Hcl]     Nausea VOMITING AND ABDOMINAL PAIN,  HEADACHE, JUST ABOUT EVERY SIDE EFFECT THE DRUG HAS  . Nortriptyline     Nightmares  . Penicillins     RASH  & ITCHING   --PT STATES SHE CAN TAKE KEFLEX PO AND IV CEPHALOSPORINS  . Statins     Leg cramps  . Tetanus Toxoids     SWELLING, REDNESS  WHOLE ARM    Her family history includes Aneurysm in her mother; Diabetes in her paternal grandfather; Heart attack (age of onset: 41) in her father; Heart disease in her father and mother; Hypertension in her brother, brother, father, and mother; Peripheral vascular disease in her father; Stroke in her mother.  She  reports that she quit smoking about 26 years ago. Her smoking use included Cigarettes. She has a 7.5 pack-year smoking history. She has never used smokeless tobacco. She reports that she drinks alcohol. She reports that she does not use illicit drugs.  Review of Systems  Constitutional: Negative for fever, chills, diaphoresis, activity change, appetite change, fatigue and unexpected weight change.  HENT: Positive for trouble swallowing. Negative for congestion, dental problem, ear discharge, ear pain, facial swelling, hearing loss, mouth sores, nosebleeds, postnasal drip, rhinorrhea, sinus pressure, sneezing, sore throat, tinnitus and voice change.   Eyes: Negative for photophobia, discharge, itching and  visual disturbance.  Respiratory: Positive for cough and shortness of breath. Negative for apnea, choking, chest tightness, wheezing and stridor.   Cardiovascular: Negative for chest pain, palpitations and leg swelling.  Gastrointestinal: Negative for nausea, vomiting, abdominal pain, constipation, blood in stool and abdominal distention.       Heartburn  Genitourinary: Negative for dysuria, urgency, frequency, hematuria, flank pain, decreased urine volume and difficulty urinating.  Musculoskeletal: Positive for arthralgias, joint swelling and myalgias. Negative for back pain, gait problem, neck pain and neck stiffness.  Skin: Negative  for color change, pallor and rash.  Neurological: Negative for dizziness, tremors, seizures, syncope, speech difficulty, weakness, light-headedness, numbness and headaches.  Hematological: Negative for adenopathy. Does not bruise/bleed easily.  Psychiatric/Behavioral: Negative for confusion, sleep disturbance and agitation. The patient is not nervous/anxious.    Physical Exam:  General - No distress ENT - No sinus tenderness, no oral exudate, no LAN, no thyromegaly, TM clear, pupils equal/reactive Cardiac - s1s2 regular, no murmur, pulses symmetric Chest - No wheeze/rales/dullness, good air entry, normal respiratory excursion Back - No focal tenderness Abd - Soft, non-tender, no organomegaly, + bowel sounds Ext - No edema Neuro - Normal strength, cranial nerves intact Skin - No rashes Psych - Normal mood, and behavior    Lab Results  Component Value Date   WBC 4.7 05/28/2012   HGB 10.0* 05/28/2012   HCT 31.9* 05/28/2012   MCV 84.6 05/28/2012   PLT 143* 05/28/2012    Lab Results  Component Value Date   CREATININE 1.46* 05/27/2012   BUN 38* 05/27/2012   NA 142 05/27/2012   K 3.6 05/27/2012   CL 104 05/27/2012   CO2 28 05/27/2012    Lab Results  Component Value Date   ALT 17 05/24/2012   AST 19 05/24/2012   ALKPHOS 93 05/24/2012   BILITOT 0.5 05/24/2012   BNP    Component Value Date/Time   PROBNP 1433.0* 05/24/2012 1457      Assessment/Plan:  Chesley Mires, MD [facility] Pager:  [phone]

## 2012-11-20 NOTE — Patient Instructions (Signed)
Will schedule chest xray with V/Q scan, breathing test (PFT), and overnight oxygen test Follow up in 6 weeks

## 2012-11-24 ENCOUNTER — Encounter (HOSPITAL_COMMUNITY): Payer: Self-pay

## 2012-11-24 ENCOUNTER — Ambulatory Visit (HOSPITAL_COMMUNITY)
Admission: RE | Admit: 2012-11-24 | Discharge: 2012-11-24 | Disposition: A | Discharge status: Home / Self Care | Payer: Medicare Other | Source: Ambulatory Visit | Attending: Pulmonary Disease | Admitting: Pulmonary Disease

## 2012-11-24 ENCOUNTER — Encounter (HOSPITAL_COMMUNITY)
Admission: RE | Admit: 2012-11-24 | Discharge: 2012-11-24 | Disposition: A | Discharge status: Home / Self Care | Payer: Medicare Other | Source: Ambulatory Visit | Attending: Pulmonary Disease | Admitting: Pulmonary Disease

## 2012-11-24 DIAGNOSIS — R0602 Shortness of breath: Secondary | ICD-10-CM | POA: Insufficient documentation

## 2012-11-24 DIAGNOSIS — R0600 Dyspnea, unspecified: Secondary | ICD-10-CM

## 2012-11-24 DIAGNOSIS — I517 Cardiomegaly: Secondary | ICD-10-CM | POA: Insufficient documentation

## 2012-11-24 DIAGNOSIS — J811 Chronic pulmonary edema: Secondary | ICD-10-CM | POA: Diagnosis not present

## 2012-11-24 MED ORDER — TECHNETIUM TO 99M ALBUMIN AGGREGATED
5.0000 | Freq: Once | INTRAVENOUS | Status: AC | PRN
  Administered 2012-11-24: 5 via INTRAVENOUS

## 2012-11-24 MED ORDER — TECHNETIUM TC 99M DIETHYLENETRIAME-PENTAACETIC ACID: 44.6000 | Freq: Once | INTRAVENOUS | Status: DC | PRN

## 2012-11-24 NOTE — Assessment & Plan Note (Signed)
This could be multifactorial.  She has recurrent pulmonary embolism.  While suspicion for chronic thrombo-embolic disease is low, this is still a possibility.  Will arrange for chest xray and V/Q scan to further assess.  She has remote history of smoking, but her symptoms are not suggestive of obstructive lung disease.  Will arrange for PFT's to further assess.  Should also be able to gather additional information for airflow obstruction with V/Q scan.  She has history of diastolic heart failure, and this is being managed by primary care and cardiology.  Of note is that she reports symptomatic improvement after recent reduction in dose of beta blocker medication.  Finally, she likely has a component of deconditioning in the setting of obesity.  Will arrange for overnight oximetry to further assess for the possibility of sleep related hypoventilation.  She would likely also benefit from monitored exercise regimen, and diet modification.

## 2012-11-25 ENCOUNTER — Ambulatory Visit (INDEPENDENT_AMBULATORY_CARE_PROVIDER_SITE_OTHER): Payer: Medicare Other | Admitting: Podiatry

## 2012-11-25 ENCOUNTER — Telehealth: Payer: Self-pay | Admitting: Pulmonary Disease

## 2012-11-25 ENCOUNTER — Encounter: Payer: Self-pay | Admitting: Podiatry

## 2012-11-25 VITALS — BP 167/67 | HR 71 | Temp 98.4°F | Resp 20

## 2012-11-25 DIAGNOSIS — L97509 Non-pressure chronic ulcer of other part of unspecified foot with unspecified severity: Secondary | ICD-10-CM

## 2012-11-25 DIAGNOSIS — G4733 Obstructive sleep apnea (adult) (pediatric): Secondary | ICD-10-CM | POA: Diagnosis not present

## 2012-11-25 DIAGNOSIS — I509 Heart failure, unspecified: Secondary | ICD-10-CM | POA: Diagnosis not present

## 2012-11-25 NOTE — Progress Notes (Signed)
Patient ID: [name], female   DOB: Apr 14, 1934, 78 y.o.   MRN: [id number]  This patient presents for ongoing care for skin ulcer plantar aspect left foot.  Objective: Hemorrhagic hyperkeratosis noted plantar left first MPJ.  Assessment resolving ulcerative lesion plantar aspect of left first metatarsophalangeal joint.  Plan: The area was debrided today without any drainage noted. She will continue to wear her surgical shoe with a Plastizote insole on the left foot. Local wound care will include a foam pad Vaseline to the area and attached with Coflex tape. She will return in 2 weeks and if he ulcer area remains stable I will allow her to begin wearing a athletic shoe with a felt pad attached on the insole to offload the plantar aspect of the left first MPJ. Today I attached a felt pad to the insole of the left athletic style shoe.  Irvine Glorioso C.Amalia Hailey, DPM

## 2012-11-25 NOTE — Patient Instructions (Addendum)
Attach foam pad around skin ulcer, apply vasoline, attach with coflesx tape. Continue to use surgical shoe.

## 2012-11-25 NOTE — Telephone Encounter (Signed)
Attempted to call pt. No answer. Will try back later.

## 2012-11-25 NOTE — Telephone Encounter (Signed)
Dg Chest 2 View  11/24/2012   CLINICAL DATA:  Shortness of breath.  EXAM: CHEST  2 VIEW  COMPARISON:  05/24/2012  FINDINGS: Cardiomegaly with vascular congestion. Slight interstitial prominence persists, slightly improved. No effusions. No acute bony abnormality.  IMPRESSION: Cardiomegaly with vascular congestion. Improving interstitial edema.   Electronically Signed   By: Rolm Baptise M.D.   On: 11/24/2012 08:43   Nm Pulmonary Per & Vent  11/24/2012   CLINICAL DATA:  Shortness of breath.  EXAM: NUCLEAR MEDICINE VENTILATION - PERFUSION LUNG SCAN  TECHNIQUE: Ventilation images were obtained in multiple projections using inhaled aerosol technetium 99 M DTPA. Perfusion images were obtained in multiple projections after intravenous injection of Tc-60mMAA.  COMPARISON:  Chest CT 05/24/2012.  RADIOPHARMACEUTICALS:  44.6 mCi Tc-95mTPA aerosol and 5.0 mCi Tc-9936mA  FINDINGS: Ventilation: Patchy ventilation defects and central clumping of the radiopharmaceutical.  Perfusion: No wedge shaped peripheral perfusion defects to suggest acute pulmonary embolism.  IMPRESSION: Negative V/Q scan for pulmonary embolism.   Electronically Signed   By: MarKalman JewelsD.   On: 11/24/2012 09:44   Attempted to contact pt to discuss results.  Will have my nurse inform pt that CXR showed expected changes with know history of high blood pressure.  No other worrisome findings.  V/Q scan did not show evidence for chronic clots in her lungs, and was otherwise normal.  Will discuss in more detail at next ROV.  No change to current treatment plan.

## 2012-11-26 NOTE — Telephone Encounter (Signed)
Pt is aware of results. 

## 2012-12-01 DIAGNOSIS — R5381 Other malaise: Secondary | ICD-10-CM | POA: Diagnosis not present

## 2012-12-01 DIAGNOSIS — J310 Chronic rhinitis: Secondary | ICD-10-CM | POA: Diagnosis not present

## 2012-12-01 DIAGNOSIS — Z006 Encounter for examination for normal comparison and control in clinical research program: Secondary | ICD-10-CM | POA: Diagnosis not present

## 2012-12-01 DIAGNOSIS — E785 Hyperlipidemia, unspecified: Secondary | ICD-10-CM | POA: Diagnosis not present

## 2012-12-01 DIAGNOSIS — G4700 Insomnia, unspecified: Secondary | ICD-10-CM | POA: Diagnosis not present

## 2012-12-01 DIAGNOSIS — M109 Gout, unspecified: Secondary | ICD-10-CM | POA: Diagnosis not present

## 2012-12-07 ENCOUNTER — Ambulatory Visit (INDEPENDENT_AMBULATORY_CARE_PROVIDER_SITE_OTHER): Payer: Medicare Other | Admitting: Podiatry

## 2012-12-07 ENCOUNTER — Encounter: Payer: Self-pay | Admitting: Podiatry

## 2012-12-07 VITALS — BP 160/80 | HR 66 | Resp 18

## 2012-12-07 DIAGNOSIS — L97509 Non-pressure chronic ulcer of other part of unspecified foot with unspecified severity: Secondary | ICD-10-CM | POA: Diagnosis not present

## 2012-12-07 NOTE — Patient Instructions (Signed)
Wear surgical shoe every other day.

## 2012-12-07 NOTE — Progress Notes (Signed)
  Subjective:    Patient ID: [name], female    DOB: October 24, 1934, 78 y.o.   MRN: [id number] It is doing a lot better and no draining  HPI patient presents for ongoing care for traumatic skin ulcer plantar left first MPJ    Review of Systems  Constitutional: Negative.   HENT: Positive for sinus pressure.   Eyes: Negative.   Respiratory: Negative.   Cardiovascular: Negative.   Gastrointestinal: Negative.   Endocrine: Negative.   Genitourinary: Negative.   Musculoskeletal: Negative.   Skin: Negative.   Allergic/Immunologic: Negative.   Neurological: Negative.   Hematological: Bruises/bleeds easily.  Psychiatric/Behavioral: Negative.        Objective:   Physical Exam Hemorrhagic hyperkeratotic lesion plantar sublet first MPJ noted. No erythema edema and drainage or malodor noted.        Assessment & Plan:  Assessment: Pre-ulcerative hyperkeratotic lesion sub-left first MPJ  Plan: The area was debrided back without any bleeding. Patient will transition into a soft surgical shoe with a felt pad attached to the insole. In addition she will wear her modified surgical shoe every other day.  Reappoint at 30 days or sooner if patient has concern.  Richard C.Amalia Hailey, DPM

## 2012-12-08 ENCOUNTER — Telehealth: Payer: Self-pay | Admitting: Pulmonary Disease

## 2012-12-08 DIAGNOSIS — G4733 Obstructive sleep apnea (adult) (pediatric): Secondary | ICD-10-CM

## 2012-12-08 DIAGNOSIS — G4730 Sleep apnea, unspecified: Secondary | ICD-10-CM | POA: Diagnosis not present

## 2012-12-08 NOTE — Telephone Encounter (Signed)
Pt's husband is aware of results. He will have her call back later, she was in the restroom when I called.

## 2012-12-08 NOTE — Telephone Encounter (Signed)
ONO with CPAP and RA 11/25/12 >> Test time 6 hrs 33 min.  Mean SpO2 90%, low SpO2 80%.  Spent 56 min with SpO2 < 88%.  Will have my nurse inform pt that ONO shows low oxygen at night even while using CPAP.  This could be relate to needing adjustment in CPAP settings or she may need supplemental oxygen at night with CPAP.  Will get CPAP download first to determine if her CPAP settings need adjustment (I have placed order) and call her with results.

## 2012-12-08 NOTE — Telephone Encounter (Signed)
Pt is aware of ONO results. States that she recently had a download report done. Advised her that we will contact [facility] and get another one.

## 2012-12-11 DIAGNOSIS — IMO0002 Reserved for concepts with insufficient information to code with codable children: Secondary | ICD-10-CM | POA: Diagnosis not present

## 2012-12-14 ENCOUNTER — Telehealth: Payer: Self-pay | Admitting: *Deleted

## 2012-12-14 NOTE — Telephone Encounter (Signed)
Pt states orthopedic doctor prescribed steroid dose pack, and was concerned it may affect the healing of the ulcer.  Informed pt I would asked Dr Amalia Hailey and call again.

## 2012-12-14 NOTE — Telephone Encounter (Signed)
Dr Amalia Hailey states should be okay to use the steroid dose pack since they were only short-term.  Informed pt of Dr Phoebe Perch statement.

## 2012-12-15 ENCOUNTER — Telehealth: Payer: Self-pay | Admitting: Pulmonary Disease

## 2012-12-15 NOTE — Telephone Encounter (Signed)
CPAP 11/01/12 to 11/30/12 >> Used on 30 of 30 nights with average 9 hrs 32 min.  Average AHI 15.8 with CPAP 12 cm H2O with significant air leak noted.  Results d/w pt.  She has since had mask refitted, and no longer having difficulty with her sleep.  She will get repeat download, and I have asked to have this sent to me.  Her recent ONO results with oxygen desaturation likely related to sleep apnea not adequately controlled due to mask leak.  Defer home oxygen set up for now.

## 2012-12-21 ENCOUNTER — Ambulatory Visit (INDEPENDENT_AMBULATORY_CARE_PROVIDER_SITE_OTHER): Payer: Medicare Other | Admitting: Pharmacist

## 2012-12-21 ENCOUNTER — Encounter: Payer: Self-pay | Admitting: Pulmonary Disease

## 2012-12-21 DIAGNOSIS — I2699 Other pulmonary embolism without acute cor pulmonale: Secondary | ICD-10-CM

## 2012-12-21 LAB — POCT INR: INR: 2.4

## 2013-01-04 ENCOUNTER — Ambulatory Visit: Payer: Medicare Other | Admitting: Pulmonary Disease

## 2013-01-04 ENCOUNTER — Ambulatory Visit (INDEPENDENT_AMBULATORY_CARE_PROVIDER_SITE_OTHER): Payer: Medicare Other | Admitting: General Practice

## 2013-01-04 ENCOUNTER — Ambulatory Visit (INDEPENDENT_AMBULATORY_CARE_PROVIDER_SITE_OTHER): Payer: Medicare Other | Admitting: Podiatry

## 2013-01-04 ENCOUNTER — Encounter: Payer: Self-pay | Admitting: Podiatry

## 2013-01-04 ENCOUNTER — Telehealth: Payer: Self-pay | Admitting: Cardiology

## 2013-01-04 VITALS — BP 166/68 | HR 72 | Temp 97.8°F | Resp 16

## 2013-01-04 DIAGNOSIS — H2510 Age-related nuclear cataract, unspecified eye: Secondary | ICD-10-CM | POA: Diagnosis not present

## 2013-01-04 DIAGNOSIS — H4011X0 Primary open-angle glaucoma, stage unspecified: Secondary | ICD-10-CM | POA: Diagnosis not present

## 2013-01-04 DIAGNOSIS — I2699 Other pulmonary embolism without acute cor pulmonale: Secondary | ICD-10-CM

## 2013-01-04 DIAGNOSIS — H3320 Serous retinal detachment, unspecified eye: Secondary | ICD-10-CM | POA: Diagnosis not present

## 2013-01-04 DIAGNOSIS — Z961 Presence of intraocular lens: Secondary | ICD-10-CM | POA: Diagnosis not present

## 2013-01-04 DIAGNOSIS — H40019 Open angle with borderline findings, low risk, unspecified eye: Secondary | ICD-10-CM | POA: Diagnosis not present

## 2013-01-04 DIAGNOSIS — L97509 Non-pressure chronic ulcer of other part of unspecified foot with unspecified severity: Secondary | ICD-10-CM

## 2013-01-04 DIAGNOSIS — H25019 Cortical age-related cataract, unspecified eye: Secondary | ICD-10-CM | POA: Diagnosis not present

## 2013-01-04 DIAGNOSIS — H33019 Retinal detachment with single break, unspecified eye: Secondary | ICD-10-CM | POA: Diagnosis not present

## 2013-01-04 LAB — POCT INR: INR: 4.4

## 2013-01-04 NOTE — Progress Notes (Signed)
Patient ID: [name], female   DOB: 05-Apr-1934, 78 y.o.   MRN: [id number] Subjective: Patient presents for followup care for ongoing traumatic ulceration left first MPJ.  Objective: Keratoses plantar left first MPJ without any hemorrhagic debris noted.  Assessment. Pre-ulcerative hyperkeratotic lesion plantar left first MPJ  Plan: The keratoses is debrided back without any bleeding. Patient will maintain a felt cut out pad attach the insole of her athletic style shoe, and wear on a continuous daily basis. I dispensed an additional pad for her to to apply to the insole of the shoe when needed. Reappoint at three-month intervals or sooner if patient has concern.

## 2013-01-04 NOTE — Telephone Encounter (Signed)
New problem    Need eye surgery on tomorrow or Wednesday.    Status of cardiac clearance .

## 2013-01-04 NOTE — Patient Instructions (Signed)
Wear athletic shoe with surgical felt pad attach to the insole on a continuous daily basis.

## 2013-01-05 ENCOUNTER — Ambulatory Visit (INDEPENDENT_AMBULATORY_CARE_PROVIDER_SITE_OTHER): Payer: Medicare Other | Admitting: General Practice

## 2013-01-05 DIAGNOSIS — I2699 Other pulmonary embolism without acute cor pulmonale: Secondary | ICD-10-CM | POA: Diagnosis not present

## 2013-01-05 NOTE — Telephone Encounter (Signed)
Tried to call this am on hold for a while and had to hang up, patient waiting in room.   Unable to call yesterday was after 5:00 before was able to return call, clinic all day.

## 2013-01-05 NOTE — Telephone Encounter (Signed)
Left message to call back  

## 2013-01-05 NOTE — Telephone Encounter (Signed)
Patient having retinal detachment repair surgery on Monday, will forward to  Dr. Mare Ferrari so he will be aware.   No clearance needed, Crystal spoke with Maebelle Munroe D regarding INR's.

## 2013-01-11 ENCOUNTER — Ambulatory Visit (INDEPENDENT_AMBULATORY_CARE_PROVIDER_SITE_OTHER): Payer: Medicare Other | Admitting: Pharmacist

## 2013-01-11 DIAGNOSIS — I2699 Other pulmonary embolism without acute cor pulmonale: Secondary | ICD-10-CM | POA: Diagnosis not present

## 2013-01-12 DIAGNOSIS — H33009 Unspecified retinal detachment with retinal break, unspecified eye: Secondary | ICD-10-CM | POA: Diagnosis not present

## 2013-01-12 DIAGNOSIS — H5460 Unqualified visual loss, one eye, unspecified: Secondary | ICD-10-CM | POA: Diagnosis not present

## 2013-01-12 DIAGNOSIS — H33019 Retinal detachment with single break, unspecified eye: Secondary | ICD-10-CM | POA: Diagnosis not present

## 2013-01-19 ENCOUNTER — Telehealth: Payer: Self-pay | Admitting: Pulmonary Disease

## 2013-01-19 NOTE — Telephone Encounter (Signed)
Pt will have to wait to be seen in Feb 2015. Thanks [place].

## 2013-01-20 ENCOUNTER — Telehealth: Payer: Self-pay | Admitting: Pulmonary Disease

## 2013-01-22 DIAGNOSIS — M19079 Primary osteoarthritis, unspecified ankle and foot: Secondary | ICD-10-CM | POA: Diagnosis not present

## 2013-01-25 NOTE — Telephone Encounter (Signed)
Pt has appt set for 03-16-13

## 2013-01-26 DIAGNOSIS — K219 Gastro-esophageal reflux disease without esophagitis: Secondary | ICD-10-CM | POA: Diagnosis not present

## 2013-01-26 DIAGNOSIS — K5000 Crohn's disease of small intestine without complications: Secondary | ICD-10-CM | POA: Diagnosis not present

## 2013-01-26 DIAGNOSIS — Z79899 Other long term (current) drug therapy: Secondary | ICD-10-CM | POA: Diagnosis not present

## 2013-01-27 DIAGNOSIS — M19079 Primary osteoarthritis, unspecified ankle and foot: Secondary | ICD-10-CM | POA: Diagnosis not present

## 2013-01-30 DIAGNOSIS — M19079 Primary osteoarthritis, unspecified ankle and foot: Secondary | ICD-10-CM | POA: Diagnosis not present

## 2013-02-01 ENCOUNTER — Ambulatory Visit (INDEPENDENT_AMBULATORY_CARE_PROVIDER_SITE_OTHER): Payer: Medicare Other

## 2013-02-01 DIAGNOSIS — I2699 Other pulmonary embolism without acute cor pulmonale: Secondary | ICD-10-CM | POA: Diagnosis not present

## 2013-02-08 DIAGNOSIS — M19079 Primary osteoarthritis, unspecified ankle and foot: Secondary | ICD-10-CM | POA: Diagnosis not present

## 2013-02-16 DIAGNOSIS — IMO0001 Reserved for inherently not codable concepts without codable children: Secondary | ICD-10-CM | POA: Diagnosis not present

## 2013-02-16 DIAGNOSIS — M109 Gout, unspecified: Secondary | ICD-10-CM | POA: Diagnosis not present

## 2013-02-16 DIAGNOSIS — M1990 Unspecified osteoarthritis, unspecified site: Secondary | ICD-10-CM | POA: Diagnosis not present

## 2013-02-17 ENCOUNTER — Ambulatory Visit (INDEPENDENT_AMBULATORY_CARE_PROVIDER_SITE_OTHER): Payer: Medicare Other | Admitting: Cardiology

## 2013-02-17 ENCOUNTER — Encounter: Payer: Self-pay | Admitting: Cardiology

## 2013-02-17 VITALS — BP 152/80 | HR 70 | Ht 62.0 in | Wt 237.0 lb

## 2013-02-17 DIAGNOSIS — R0609 Other forms of dyspnea: Secondary | ICD-10-CM

## 2013-02-17 DIAGNOSIS — I6529 Occlusion and stenosis of unspecified carotid artery: Secondary | ICD-10-CM | POA: Diagnosis not present

## 2013-02-17 DIAGNOSIS — R5383 Other fatigue: Principal | ICD-10-CM

## 2013-02-17 DIAGNOSIS — IMO0001 Reserved for inherently not codable concepts without codable children: Secondary | ICD-10-CM

## 2013-02-17 DIAGNOSIS — I119 Hypertensive heart disease without heart failure: Secondary | ICD-10-CM | POA: Diagnosis not present

## 2013-02-17 DIAGNOSIS — R5381 Other malaise: Secondary | ICD-10-CM | POA: Diagnosis not present

## 2013-02-17 DIAGNOSIS — I6521 Occlusion and stenosis of right carotid artery: Secondary | ICD-10-CM

## 2013-02-17 DIAGNOSIS — R0989 Other specified symptoms and signs involving the circulatory and respiratory systems: Secondary | ICD-10-CM

## 2013-02-17 DIAGNOSIS — I509 Heart failure, unspecified: Secondary | ICD-10-CM

## 2013-02-17 DIAGNOSIS — I5033 Acute on chronic diastolic (congestive) heart failure: Secondary | ICD-10-CM

## 2013-02-17 DIAGNOSIS — M797 Fibromyalgia: Secondary | ICD-10-CM

## 2013-02-17 MED ORDER — METOPROLOL SUCCINATE ER 25 MG PO TB24: ORAL_TABLET | ORAL | Status: DC

## 2013-02-17 NOTE — Progress Notes (Signed)
[name] Date of Birth:  Dec 28, 1934 [address] [place], [place]  [address] [phone]         Fax   [phone]  History of Present Illness: This pleasant 79 year old woman is seen for a scheduled four-month followup office visit.  The patient has a past history of pulmonary emboli and is on long-term Coumadin for The patient remains on long-term Coumadin. Her INR has been stable. She has a history of essential hypertension and a history of right carotid artery occlusion. She has a past history of pulmonary emboli in January 2011 after right hip surgery and at that time venous Dopplers of the legs were negative for a source of embolization and she has remained on long-term Coumadin. 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 Donnetta Hutching. She does not have any significant coronary disease and she had cardiac catheterization in 1994 showing only mild plaque and she had a nuclear stress test in December 2011 showing no ischemia and her ejection fraction was 78%.  Earlier this year she had left total shoulder replacement surgery by Dr. Onnie Graham.  In October she saw pulmonary because of exertional dyspnea.  She had a chest x-ray on 11/24/12 showing cardiomegaly.  She had a VQ scan which was negative for new pulmonary emboli.  The patient had an echocardiogram on 05/25/12 which showed an ejection fraction of 55-60% and no significant valve abnormalities.  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  This occurred over the Thanksgiving holiday.  Her surgery had to be delayed a week in order to allow her supratherapeutic INR to correct itself.  Her vision is still not back to baseline in the left eye.   Current Outpatient Prescriptions  Medication Sig Dispense Refill  . allopurinol (ZYLOPRIM) 300 MG tablet Take 300 mg by mouth Daily.       [name] Kitchen amLODipine  (NORVASC) 5 MG tablet Take 5 mg by mouth Daily.       . Brimonidine Tartrate-Timolol (COMBIGAN OP) Place 1 drop into the right eye 2 (two) times daily. RIGHT EYE      . calcium citrate-vitamin D (CITRACAL+D) 315-200 MG-UNIT per tablet Take 1 tablet by mouth daily.      . CELEBREX 200 MG capsule Take 200 mg by mouth Daily.      . cetirizine (ZYRTEC) 10 MG tablet Take 10 mg by mouth daily.      . cholecalciferol (VITAMIN D) 1000 UNITS tablet Take 3,000 Units by mouth daily.      . diphenhydrAMINE (BENADRYL) 25 mg capsule Take 25 mg by mouth at bedtime as needed for sleep. For sleep      . fenofibrate 160 MG tablet Take 160 mg by mouth every evening.       . furosemide (LASIX) 40 MG tablet Take 20 mg by mouth daily as needed (for fluid retention).      [name] Kitchen HYDROcodone-acetaminophen (NORCO/VICODIN) 5-325 MG per tablet Take 1 tablet by mouth every 6 (six) hours as needed for pain.      . indapamide (LOZOL) 1.25 MG tablet Take 1.25 mg by mouth every morning.      [name] Kitchen ketoconazole (NIZORAL) 2 % cream Apply 1 application topically 2 (two) times daily as needed.       . Magnesium 400 MG CAPS Take 400 mg by mouth  daily.       . Multiple Vitamins-Minerals (MULTIVITAMIN WITH MINERALS) tablet Take 1 tablet by mouth daily.      [name] Kitchen omeprazole (PRILOSEC) 40 MG capsule Take 40 mg by mouth daily.       [name] Kitchen warfarin (COUMADIN) 2.5 MG tablet TAKE (1) TABLET AS DIRECTED.  35 tablet  3  . metoprolol succinate (TOPROL XL) 25 MG 24 hr tablet 1/2 TABLET DAILY  30 tablet  PRN  . [DISCONTINUED] amitriptyline (ELAVIL) 10 MG tablet Take 10 mg by mouth at bedtime.        No current facility-administered medications for this visit.    Allergies  Allergen Reactions  . Ciprofloxacin     RASH  . Codeine     NAUSEA  . Cymbalta [Duloxetine Hcl]     Nausea VOMITING AND ABDOMINAL PAIN, HEADACHE, JUST ABOUT EVERY SIDE EFFECT THE DRUG HAS  . Nortriptyline     Nightmares  . Penicillins     RASH  & ITCHING   --PT STATES SHE CAN  TAKE KEFLEX PO AND IV CEPHALOSPORINS  . Statins     Leg cramps  . Tetanus Toxoids     SWELLING, REDNESS  WHOLE ARM  . Lyrica [Pregabalin] Diarrhea, Nausea And Vomiting and Rash    Patient Active Problem List   Diagnosis Date Noted  . Dyspnea 11/24/2012  . Malaise and fatigue 10/20/2012  . Pulmonary embolism 05/24/2012  . Acute on chronic diastolic congestive heart failure 05/24/2012  . Normocytic anemia 05/24/2012  . Thrombocytopenia 05/24/2012  . Inadequate anticoagulation 05/24/2012  . Elevated brain natriuretic peptide (BNP) level 05/24/2012  . Shoulder arthritis 05/15/2012  . Occlusion and stenosis of carotid artery without mention of cerebral infarction 09/10/2011  . OA (osteoarthritis) of hip 06/17/2011  . Osteoarthritis 01/08/2011  . Benign hypertensive heart disease without heart failure 08/06/2010  . Fibromyalgia 08/06/2010  . Dyslipidemia 08/06/2010  . Gout 08/06/2010  . Exogenous obesity 08/06/2010  . Right carotid artery occlusion 08/06/2010    History  Smoking status  . Former Smoker -- 0.50 packs/day for 15 years  . Types: Cigarettes  . Quit date: 02/11/1986  Smokeless tobacco  . Never Used    History  Alcohol Use  . Yes    Comment: 05/14/2012 "have 2-3 drinks/yr" maybe    Family History  Problem Relation Age of Onset  . Heart disease Father     Heart Disease before age 31  . Heart attack Father 20  . Hypertension Father   . Peripheral vascular disease Father     Right  leg amputation  . Heart disease Mother   . Stroke Mother   . Aneurysm Mother   . Hypertension Mother   . Hypertension Brother   . Hypertension Brother   . Diabetes Paternal Grandfather     Review of Systems: Constitutional: no fever chills diaphoresis or fatigue or change in weight.  Head and neck: no hearing loss, no epistaxis, no photophobia or visual disturbance. Respiratory: No cough, shortness of breath or wheezing. Cardiovascular: No chest pain peripheral edema,  palpitations. Gastrointestinal: No abdominal distention, no abdominal pain, no change in bowel habits hematochezia or melena. Genitourinary: No dysuria, no frequency, no urgency, no nocturia. Musculoskeletal:No arthralgias, no back pain, no gait disturbance or myalgias. Neurological: No dizziness, no headaches, no numbness, no seizures, no syncope, no weakness, no tremors. Hematologic: No lymphadenopathy, no easy bruising. Psychiatric: No confusion, no hallucinations, no sleep disturbance.    Physical Exam: Filed Vitals:   02/17/13  1522  BP: 152/80  Pulse: 70   the general appearance reveals a large middle-aged woman in no distress.The head and neck exam reveals pupils equal and reactive.  Extraocular movements are full.  There is no scleral icterus.  The mouth and pharynx are normal.  The neck is supple.  The carotids reveal no bruits.  The jugular venous pressure is normal.  The  thyroid is not enlarged.  There is no lymphadenopathy.  The chest is clear to percussion and auscultation.  There are no rales or rhonchi.  Expansion of the chest is symmetrical.  The precordium is quiet.  The first heart sound is normal.  The second heart sound is physiologically split.  There is no murmur gallop rub or click.  There is no abnormal lift or heave.  The abdomen is soft and nontender.  The bowel sounds are normal.  The liver and spleen are not enlarged.  There are no abdominal masses.  There are no abdominal bruits.  Extremities reveal good pedal pulses.  There is no phlebitis or edema.  There is no cyanosis or clubbing.  Strength is normal and symmetrical in all extremities.  There is no lateralizing weakness.  There are no sensory deficits.  The skin is warm and dry.  There is no rash.     Assessment / Plan: Continue same medication except switch to metoprolol succinate 12.5 mg once a day in the evening. Recheck here in 4 months for followup office visit.  Continue efforts at weight loss.

## 2013-02-17 NOTE — Assessment & Plan Note (Signed)
The patient has not been having any TIA symptoms.  She remains on long-term Coumadin anticoagulation.

## 2013-02-17 NOTE — Assessment & Plan Note (Signed)
Blood pressure has been stable on current therapy.  However she continues to complain of a lot of fatigue which she attributes to the metoprolol tartrate.  She is presently on metoprolol tartrate 12.5 mg twice a day.  We're going to switch her to metoprolol succinate 12.5 mg just once a day in the evening and see if side effects symptoms improve.

## 2013-02-17 NOTE — Assessment & Plan Note (Signed)
The patient has a history of fluid retention.  She takes Lasix on the average of just twice a week.  She also takes low-dose Lozol daily

## 2013-02-17 NOTE — Patient Instructions (Signed)
STOP LOPRESSOR AND START TOPROL XL 25 MG 1/2 TABLET DAILY  Your physician wants you to follow-up in: Combine will receive a reminder letter in the mail two months in advance. If you don't receive a letter, please call our office to schedule the follow-up appointment.

## 2013-02-26 DIAGNOSIS — M19079 Primary osteoarthritis, unspecified ankle and foot: Secondary | ICD-10-CM | POA: Diagnosis not present

## 2013-03-01 DIAGNOSIS — Z0000 Encounter for general adult medical examination without abnormal findings: Secondary | ICD-10-CM | POA: Diagnosis not present

## 2013-03-02 ENCOUNTER — Ambulatory Visit (INDEPENDENT_AMBULATORY_CARE_PROVIDER_SITE_OTHER): Payer: Medicare Other | Admitting: *Deleted

## 2013-03-02 DIAGNOSIS — I2699 Other pulmonary embolism without acute cor pulmonale: Secondary | ICD-10-CM | POA: Diagnosis not present

## 2013-03-02 LAB — POCT INR: INR: 2.7

## 2013-03-08 ENCOUNTER — Other Ambulatory Visit: Payer: Self-pay | Admitting: Cardiology

## 2013-03-08 ENCOUNTER — Encounter: Payer: Self-pay | Admitting: Podiatry

## 2013-03-08 ENCOUNTER — Ambulatory Visit (INDEPENDENT_AMBULATORY_CARE_PROVIDER_SITE_OTHER): Payer: Medicare Other | Admitting: Podiatry

## 2013-03-08 VITALS — BP 164/73 | HR 71 | Resp 18

## 2013-03-08 DIAGNOSIS — E785 Hyperlipidemia, unspecified: Secondary | ICD-10-CM | POA: Diagnosis not present

## 2013-03-08 DIAGNOSIS — L259 Unspecified contact dermatitis, unspecified cause: Secondary | ICD-10-CM

## 2013-03-08 DIAGNOSIS — I10 Essential (primary) hypertension: Secondary | ICD-10-CM | POA: Diagnosis not present

## 2013-03-08 DIAGNOSIS — N390 Urinary tract infection, site not specified: Secondary | ICD-10-CM | POA: Diagnosis not present

## 2013-03-08 DIAGNOSIS — Z0000 Encounter for general adult medical examination without abnormal findings: Secondary | ICD-10-CM | POA: Diagnosis not present

## 2013-03-08 NOTE — Progress Notes (Signed)
   Subjective:    Patient ID: [name], female    DOB: [birth year], 79 y.o.   MRN: [id number]  HPI My left foot is looking good and and there is a crack on my right heel and hard place and been going on for about 3 weeks and it is dry and peeling  Patient presents for followup care for ulceration plantar left first MPJ. She was last seen 01/04/2013 for this problem. In addition, patient like to have the right heel for some tenderness in the area.  Review of Systems     Objective:   Physical Exam Orientated x18 79 year old white female presents with her husband  Dermatological: Slight residual callus plantar left first MPJ noted. This was the previous site of a skin ulcer. The posterior lateral right heel appears dry with a fissure.      Assessment & Plan:   Assessment: Resolved plantar skin ulcer left first MPJ Fissure/dermatitis right heel  Plan: Patient will continue to wear shoe with pad attached to insole to offload left first MPJ. Advised patient to apply an all-purpose skin lotion to the right heel daily, cover with saran wrap at night until healed.  Reappoint at patient's request.

## 2013-03-08 NOTE — Patient Instructions (Signed)
Apply skin lotion to right heel and cover with saran wrap at night.

## 2013-03-11 DIAGNOSIS — IMO0001 Reserved for inherently not codable concepts without codable children: Secondary | ICD-10-CM | POA: Diagnosis not present

## 2013-03-11 DIAGNOSIS — E785 Hyperlipidemia, unspecified: Secondary | ICD-10-CM | POA: Diagnosis not present

## 2013-03-11 DIAGNOSIS — M1990 Unspecified osteoarthritis, unspecified site: Secondary | ICD-10-CM | POA: Diagnosis not present

## 2013-03-11 DIAGNOSIS — K219 Gastro-esophageal reflux disease without esophagitis: Secondary | ICD-10-CM | POA: Diagnosis not present

## 2013-03-16 ENCOUNTER — Ambulatory Visit (INDEPENDENT_AMBULATORY_CARE_PROVIDER_SITE_OTHER): Payer: Medicare Other | Admitting: Pulmonary Disease

## 2013-03-16 ENCOUNTER — Encounter: Payer: Self-pay | Admitting: Family

## 2013-03-16 ENCOUNTER — Ambulatory Visit: Payer: Medicare Other | Admitting: Pulmonary Disease

## 2013-03-16 DIAGNOSIS — R0609 Other forms of dyspnea: Secondary | ICD-10-CM

## 2013-03-16 DIAGNOSIS — R0989 Other specified symptoms and signs involving the circulatory and respiratory systems: Secondary | ICD-10-CM | POA: Diagnosis not present

## 2013-03-16 DIAGNOSIS — R0600 Dyspnea, unspecified: Secondary | ICD-10-CM

## 2013-03-16 LAB — PULMONARY FUNCTION TEST
DL/VA % pred: 125 %
DL/VA: 5.33 ml/min/mmHg/L
DLCO UNC % PRED: 82 %
DLCO unc: 15.55 ml/min/mmHg
FEF 25-75 Post: 1.24 L/sec
FEF 25-75 Pre: 0.8 L/sec
FEF2575-%Change-Post: 55 %
FEF2575-%PRED-POST: 91 %
FEF2575-%Pred-Pre: 58 %
FEV1-%Change-Post: 11 %
FEV1-%Pred-Post: 71 %
FEV1-%Pred-Pre: 63 %
FEV1-POST: 1.21 L
FEV1-Pre: 1.08 L
FEV1FVC-%CHANGE-POST: 7 %
FEV1FVC-%Pred-Pre: 100 %
FEV6-%Change-Post: 3 %
FEV6-%Pred-Post: 69 %
FEV6-%Pred-Pre: 66 %
FEV6-PRE: 1.43 L
FEV6-Post: 1.49 L
FEV6FVC-%Pred-Post: 105 %
FEV6FVC-%Pred-Pre: 105 %
FVC-%CHANGE-POST: 3 %
FVC-%Pred-Post: 65 %
FVC-%Pred-Pre: 63 %
FVC-PRE: 1.43 L
FVC-Post: 1.49 L
PRE FEV1/FVC RATIO: 75 %
PRE FEV6/FVC RATIO: 100 %
Post FEV1/FVC ratio: 81 %
Post FEV6/FVC ratio: 100 %
RV % PRED: 70 %
RV: 1.5 L
TLC % PRED: 73 %
TLC: 3.27 L

## 2013-03-16 NOTE — Progress Notes (Signed)
PFT done today. 

## 2013-03-17 ENCOUNTER — Ambulatory Visit: Payer: Medicare Other | Admitting: Family

## 2013-03-17 ENCOUNTER — Ambulatory Visit (INDEPENDENT_AMBULATORY_CARE_PROVIDER_SITE_OTHER): Payer: Medicare Other | Admitting: Family

## 2013-03-17 ENCOUNTER — Other Ambulatory Visit: Payer: Self-pay | Admitting: Vascular Surgery

## 2013-03-17 ENCOUNTER — Other Ambulatory Visit: Payer: Medicare Other

## 2013-03-17 ENCOUNTER — Encounter: Payer: Self-pay | Admitting: Family

## 2013-03-17 ENCOUNTER — Ambulatory Visit (HOSPITAL_COMMUNITY)
Admission: RE | Admit: 2013-03-17 | Discharge: 2013-03-17 | Disposition: A | Discharge status: Home / Self Care | Payer: Medicare Other | Source: Ambulatory Visit | Attending: Family | Admitting: Family

## 2013-03-17 VITALS — BP 178/87 | HR 63 | Resp 16 | Ht 62.0 in | Wt 239.0 lb

## 2013-03-17 DIAGNOSIS — H4011X0 Primary open-angle glaucoma, stage unspecified: Secondary | ICD-10-CM | POA: Diagnosis not present

## 2013-03-17 DIAGNOSIS — H1045 Other chronic allergic conjunctivitis: Secondary | ICD-10-CM | POA: Diagnosis not present

## 2013-03-17 DIAGNOSIS — I6529 Occlusion and stenosis of unspecified carotid artery: Secondary | ICD-10-CM

## 2013-03-17 DIAGNOSIS — Z48812 Encounter for surgical aftercare following surgery on the circulatory system: Secondary | ICD-10-CM

## 2013-03-17 DIAGNOSIS — Z961 Presence of intraocular lens: Secondary | ICD-10-CM | POA: Diagnosis not present

## 2013-03-17 DIAGNOSIS — H33059 Total retinal detachment, unspecified eye: Secondary | ICD-10-CM | POA: Diagnosis not present

## 2013-03-17 DIAGNOSIS — H2510 Age-related nuclear cataract, unspecified eye: Secondary | ICD-10-CM | POA: Diagnosis not present

## 2013-03-17 DIAGNOSIS — H01029 Squamous blepharitis unspecified eye, unspecified eyelid: Secondary | ICD-10-CM | POA: Diagnosis not present

## 2013-03-17 NOTE — Progress Notes (Signed)
Established Carotid Patient   History of Present Illness  [name] is a 79 y.o. female patient of Dr. Donnetta Hutching seen for left-sided carotid stenosis with a known right ICA occlusion. She returns today for follow up.  Patient has not had previous carotid artery intervention. She denies any remote or recent stroke of TIA symptoms. Denies steal symptoms. Does not have claudication symptoms. Tires easily since had the PE. Has mild neuropathy in both feet for years, attributes to back issues.  She had detatched retina repaired Dec. 2, 2014. Last April she had a left total shoulder replaced, then developed PE's, she now takes coumadin. She has had lumbar spine surgery. She has severe generalized OA, has affected her feet, has trouble walking. She states her blood pressure usually runs 124-132/ 68-72, is hypertensive now. She states she has proven white coat syndrome.  Pt Diabetic: No Pt smoker: former smoker, quit 25 years ago  Pt meds include: Statin : No: cannot tolerate Betablocker: Yes ASA: No Other anticoagulants/antiplatelets: coumadin   Past Medical History  Diagnosis Date  . Hypertension   . Gout   . Tinnitus   . Acid reflux disease   . Pulmonary embolism 2011    history of a postsurgical pulmonary embolism; "due to immobility; not OR" (05/14/2012)  . Vertigo   . Crohn disease   . Long term current use of anticoagulant   . GERD (gastroesophageal reflux disease)   . Recurrent upper respiratory infection (URI)     BRONCHITIS FEB 2013--SLIGHT COUGH NON-PRODUCTIVE NOW  . H/O hiatal hernia   . PVC (premature ventricular contraction)     PT STATES HX OF PVC'S ON EKG  . PONV (postoperative nausea and vomiting)   . Peripheral vascular disease     KNOWN RIGHT INTERNAL CAROTID ARTERY OCCLUSION (NO STROKE)  --40 TO 59% STENOSIS LEFT ICA-FOLLOWED BY DR. EARLY WITH DOPPLER STUDY EVERY 6 MONTHS  . Fibromyalgia   . High triglycerides   . Exertional shortness of breath   .  Chronic bronchitis     "get it once/yr" (05/14/2012)  . Obstructive sleep apnea on CPAP   . Crohn's disease   . History of stomach ulcers 1950's  . Osteoarthritis     PAIN AND OA LEF T HIP AND BOTH SHOULDERS ARE BONE ON BONE AND PAINFUL  . Recurrent UTI (urinary tract infection)     "on daily medicine" (05/14/2012)  . Carotid artery occlusion     Social History History  Substance Use Topics  . Smoking status: Former Smoker -- 0.50 packs/day for 15 years    Types: Cigarettes    Quit date: 02/11/1986  . Smokeless tobacco: Never Used  . Alcohol Use: Yes     Comment: 05/14/2012 "have 2-3 drinks/yr" maybe    Family History Family History  Problem Relation Age of Onset  . Heart disease Father     Heart Disease before age 77  . Heart attack Father 90  . Hypertension Father   . Peripheral vascular disease Father     Right  leg amputation  . Heart disease Mother     AAA  . Stroke Mother   . Aneurysm Mother   . Hypertension Mother   . Hypertension Brother   . Heart disease Brother     Heart Disease before age 64  . Hyperlipidemia Brother   . Hypertension Brother   . Diabetes Paternal Grandfather   . Diabetes Son     Surgical History Past Surgical History  Procedure  Laterality Date  . Femur fracture surgery Right 02/21/09    Open reduction internal fixation of right periprosthetic  femur fracture utilizing Zimmer cables times fiv  . Hammer toe surgery Left 10/25/08    "toe next to big to" (05/14/2012)  . Back surgery  10/29/06    Central and foraminal decompression L3-L4, L4-L5, and L5-S1 with inspection of L4-L5 and L5-S1 disc on the right  . Knee arthroscopy Right 07/26/05  . Total hip arthroplasty Right 12/08/02    Osteonics total hip replacement  . Excision morton's neuroma Right 05/20/00    "foot" (05/14/2012)  . Total hip arthroplasty  06/17/2011    Procedure: TOTAL HIP ARTHROPLASTY;  Surgeon: Gearlean Alf, MD;  Location: [facility];  Service: Orthopedics;  Laterality: Left;  .  Joint replacement  2009    Right Hip replacement  . Joint replacement  2012    Right knee replacement  . Joint replacement  06/17/11    Left Hip replacement  . Fracture surgery  2011    Right Femur Fx  . Total shoulder arthroplasty Left 05/14/2012  . Tonsillectomy and adenoidectomy  1958?  [name] Kitchen Appendectomy  1950's  . Cholecystectomy  ~ 1988  . Vaginal hysterectomy  1971  . Dilation and curettage of uterus      "several; from miscarriages" (05/14/2012)  . Decompressive lumbar laminectomy level 3  ~ 2002  . Replacement total knee Right 02/19/2010  . Cardiac catheterization  1970's    "maybe 2" (05/14/2012)  . Cardiac catheterization    . Cataract extraction w/ intraocular lens implant Right 2009  . Total shoulder arthroplasty Left 05/14/2012    Procedure: TOTAL SHOULDER ARTHROPLASTY;  Surgeon: Marin Shutter, MD;  Location: [facility];  Service: Orthopedics;  Laterality: Left;  [name] Kitchen Eye surgery Left 2014    Detached retina    Allergies  Allergen Reactions  . Ciprofloxacin     RASH  . Codeine     NAUSEA  . Cymbalta [Duloxetine Hcl]     Nausea VOMITING AND ABDOMINAL PAIN, HEADACHE, JUST ABOUT EVERY SIDE EFFECT THE DRUG HAS  . Nortriptyline     Nightmares  . Penicillins     RASH  & ITCHING   --PT STATES SHE CAN TAKE KEFLEX PO AND IV CEPHALOSPORINS  . Statins     Leg cramps  . Tetanus Toxoids     SWELLING, REDNESS  WHOLE ARM  . Lyrica [Pregabalin] Diarrhea, Nausea And Vomiting and Rash    Current Outpatient Prescriptions  Medication Sig Dispense Refill  . allopurinol (ZYLOPRIM) 300 MG tablet Take 300 mg by mouth Daily.       [name] Kitchen amLODipine (NORVASC) 5 MG tablet Take 5 mg by mouth Daily.       . Brimonidine Tartrate-Timolol (COMBIGAN OP) Place 1 drop into the right eye 2 (two) times daily. RIGHT EYE      . calcium citrate-vitamin D (CITRACAL+D) 315-200 MG-UNIT per tablet Take 1 tablet by mouth daily.      . CELEBREX 200 MG capsule Take 200 mg by mouth Daily.      . cetirizine (ZYRTEC) 10 MG  tablet Take 10 mg by mouth daily.      . cholecalciferol (VITAMIN D) 1000 UNITS tablet Take 3,000 Units by mouth daily.      . diphenhydrAMINE (BENADRYL) 25 mg capsule Take 25 mg by mouth at bedtime as needed for sleep. For sleep      . fenofibrate 160 MG tablet Take 160 mg by mouth  every evening.       . furosemide (LASIX) 40 MG tablet Take 20 mg by mouth daily as needed (for fluid retention).      [name] Kitchen HYDROcodone-acetaminophen (NORCO/VICODIN) 5-325 MG per tablet Take 1 tablet by mouth every 6 (six) hours as needed for pain.      . indapamide (LOZOL) 1.25 MG tablet Take 1.25 mg by mouth every morning.      [name] Kitchen ipratropium (ATROVENT) 0.06 % nasal spray       . ketoconazole (NIZORAL) 2 % cream Apply 1 application topically 2 (two) times daily as needed.       . Magnesium 400 MG CAPS Take 400 mg by mouth daily.       . metoprolol succinate (TOPROL XL) 25 MG 24 hr tablet 1/2 TABLET DAILY  30 tablet  PRN  . Multiple Vitamins-Minerals (MULTIVITAMIN WITH MINERALS) tablet Take 1 tablet by mouth daily.      [name] Kitchen omeprazole (PRILOSEC) 40 MG capsule Take 40 mg by mouth daily.       [name] Kitchen oxyCODONE-acetaminophen (PERCOCET/ROXICET) 5-325 MG per tablet       . warfarin (COUMADIN) 2.5 MG tablet TAKE (1) TABLET AS DIRECTED.  35 tablet  3  . eszopiclone (LUNESTA) 2 MG TABS tablet       . metoprolol tartrate (LOPRESSOR) 25 MG tablet       . nitrofurantoin (MACRODANTIN) 50 MG capsule       . prednisoLONE acetate (PRED FORTE) 1 % ophthalmic suspension       . predniSONE (STERAPRED UNI-PAK) 10 MG tablet       . tobramycin (TOBREX) 0.3 % ophthalmic solution       . [DISCONTINUED] amitriptyline (ELAVIL) 10 MG tablet Take 10 mg by mouth at bedtime.        No current facility-administered medications for this visit.    Review of Systems : See HPI for pertinent positives and negatives.  Physical Examination  Filed Vitals:   03/17/13 1054  BP: 178/87  Pulse: 63  Resp: 16   Filed Weights   03/17/13 1054  Weight: 239  lb (108.41 kg)   Body mass index is 43.7 kg/(m^2).   General: WDWN obese female in NAD GAIT: slow, deliberate, using cane Eyes: PERRLA Pulmonary:  CTAB, Negative  Rales, Negative rhonchi, & Negative wheezing.  Cardiac: regular Rhythm ,  Negative Murmurs.  VASCULAR EXAM Carotid Bruits Left Right   Positive Negative   Radial pulses are 2+ palpable and  equal.                                                                                                                            LE Pulses LEFT RIGHT       POPLITEAL  not palpable   not palpable    Gastrointestinal: soft, nontender, BS WNL, no r/g,  negative masses.  Musculoskeletal: Negative muscle atrophy/wasting. M/S 4/5 throughout, Extremities without ischemic changes.  Neurologic: A&O X 3; Appropriate  Affect ; SENSATION ;normal;  Speech is normal CN 2-12 intact, Pain and light touch intact in extremities, Motor exam as listed above.   Non-Invasive Vascular Imaging CAROTID DUPLEX 03/17/2013   Right ICA: 0ccluded. Left ICA: 60 - 79 % stenosis.  Previous carotid studies demonstrated: RICA 0cclusion, LICA 40 - 59 % stenosis.  These findings are Worse in the left ICAfrom previous exam.  Assessment: LYNSEE WANDS is a 79 y.o. female who presents with asymptomatic known right occluded ICA and 60 - 79 % Left ICA  Stenosis. The  ICA stenosis is  Worse from previous exam. Fortunately she is not diabetic, unfortunately she continues to smoke and was counseled re smoking cessation.  Plan: Follow-up in 6 months with Carotid Duplex scan.   I discussed in depth with the patient the nature of atherosclerosis, and emphasized the importance of maximal medical management including strict control of blood pressure, blood glucose, and lipid levels, obtaining regular exercise, and continued cessation of smoking.  The patient is aware that without maximal medical management the underlying atherosclerotic disease process will progress,  limiting the benefit of any interventions. The patient was given information about stroke prevention and what symptoms should prompt the patient to seek immediate medical care. Thank you for allowing [place] to participate in this patient's care.  Clemon Chambers, RN, MSN, FNP-C [facility] Office: [phone]  Clinic Physician: Scot Dock  03/17/2013 11:03 AM

## 2013-03-17 NOTE — Patient Instructions (Signed)

## 2013-03-19 DIAGNOSIS — M79609 Pain in unspecified limb: Secondary | ICD-10-CM | POA: Diagnosis not present

## 2013-03-19 DIAGNOSIS — M19079 Primary osteoarthritis, unspecified ankle and foot: Secondary | ICD-10-CM | POA: Diagnosis not present

## 2013-04-01 ENCOUNTER — Inpatient Hospital Stay (HOSPITAL_COMMUNITY): Payer: Medicare Other

## 2013-04-01 ENCOUNTER — Encounter (HOSPITAL_COMMUNITY): Payer: Self-pay | Admitting: General Practice

## 2013-04-01 ENCOUNTER — Encounter: Payer: Self-pay | Admitting: Pulmonary Disease

## 2013-04-01 ENCOUNTER — Ambulatory Visit (INDEPENDENT_AMBULATORY_CARE_PROVIDER_SITE_OTHER): Payer: Medicare Other | Admitting: Pulmonary Disease

## 2013-04-01 ENCOUNTER — Inpatient Hospital Stay (HOSPITAL_COMMUNITY)
Admission: AD | Admit: 2013-04-01 | Discharge: 2013-04-02 | DRG: 309 | Disposition: A | Discharge status: Home Health | Payer: Medicare Other | Source: Ambulatory Visit | Attending: Internal Medicine | Admitting: Internal Medicine

## 2013-04-01 VITALS — BP 124/82 | HR 130 | Ht 62.0 in | Wt 239.0 lb

## 2013-04-01 DIAGNOSIS — I6529 Occlusion and stenosis of unspecified carotid artery: Secondary | ICD-10-CM | POA: Diagnosis present

## 2013-04-01 DIAGNOSIS — Z791 Long term (current) use of non-steroidal anti-inflammatories (NSAID): Secondary | ICD-10-CM

## 2013-04-01 DIAGNOSIS — G4733 Obstructive sleep apnea (adult) (pediatric): Secondary | ICD-10-CM | POA: Diagnosis present

## 2013-04-01 DIAGNOSIS — I509 Heart failure, unspecified: Secondary | ICD-10-CM | POA: Diagnosis present

## 2013-04-01 DIAGNOSIS — Z96649 Presence of unspecified artificial hip joint: Secondary | ICD-10-CM | POA: Diagnosis not present

## 2013-04-01 DIAGNOSIS — I4891 Unspecified atrial fibrillation: Principal | ICD-10-CM

## 2013-04-01 DIAGNOSIS — Z87891 Personal history of nicotine dependence: Secondary | ICD-10-CM

## 2013-04-01 DIAGNOSIS — E876 Hypokalemia: Secondary | ICD-10-CM | POA: Diagnosis present

## 2013-04-01 DIAGNOSIS — K219 Gastro-esophageal reflux disease without esophagitis: Secondary | ICD-10-CM | POA: Diagnosis present

## 2013-04-01 DIAGNOSIS — IMO0001 Reserved for inherently not codable concepts without codable children: Secondary | ICD-10-CM | POA: Diagnosis present

## 2013-04-01 DIAGNOSIS — Z96659 Presence of unspecified artificial knee joint: Secondary | ICD-10-CM | POA: Diagnosis not present

## 2013-04-01 DIAGNOSIS — I5032 Chronic diastolic (congestive) heart failure: Secondary | ICD-10-CM | POA: Diagnosis not present

## 2013-04-01 DIAGNOSIS — Z79899 Other long term (current) drug therapy: Secondary | ICD-10-CM

## 2013-04-01 DIAGNOSIS — Z6841 Body Mass Index (BMI) 40.0 and over, adult: Secondary | ICD-10-CM

## 2013-04-01 DIAGNOSIS — Z86711 Personal history of pulmonary embolism: Secondary | ICD-10-CM

## 2013-04-01 DIAGNOSIS — M109 Gout, unspecified: Secondary | ICD-10-CM | POA: Diagnosis present

## 2013-04-01 DIAGNOSIS — N390 Urinary tract infection, site not specified: Secondary | ICD-10-CM | POA: Diagnosis not present

## 2013-04-01 DIAGNOSIS — I5033 Acute on chronic diastolic (congestive) heart failure: Secondary | ICD-10-CM | POA: Diagnosis not present

## 2013-04-01 DIAGNOSIS — E781 Pure hyperglyceridemia: Secondary | ICD-10-CM | POA: Diagnosis present

## 2013-04-01 DIAGNOSIS — I2699 Other pulmonary embolism without acute cor pulmonale: Secondary | ICD-10-CM | POA: Diagnosis present

## 2013-04-01 DIAGNOSIS — J9819 Other pulmonary collapse: Secondary | ICD-10-CM | POA: Diagnosis present

## 2013-04-01 DIAGNOSIS — Z7901 Long term (current) use of anticoagulants: Secondary | ICD-10-CM | POA: Diagnosis not present

## 2013-04-01 DIAGNOSIS — R5381 Other malaise: Secondary | ICD-10-CM | POA: Diagnosis not present

## 2013-04-01 DIAGNOSIS — R5383 Other fatigue: Secondary | ICD-10-CM | POA: Diagnosis not present

## 2013-04-01 DIAGNOSIS — Z9989 Dependence on other enabling machines and devices: Secondary | ICD-10-CM

## 2013-04-01 DIAGNOSIS — R918 Other nonspecific abnormal finding of lung field: Secondary | ICD-10-CM | POA: Diagnosis not present

## 2013-04-01 DIAGNOSIS — B961 Klebsiella pneumoniae [K. pneumoniae] as the cause of diseases classified elsewhere: Secondary | ICD-10-CM | POA: Diagnosis present

## 2013-04-01 DIAGNOSIS — I110 Hypertensive heart disease with heart failure: Secondary | ICD-10-CM | POA: Diagnosis present

## 2013-04-01 DIAGNOSIS — R7989 Other specified abnormal findings of blood chemistry: Secondary | ICD-10-CM

## 2013-04-01 DIAGNOSIS — E785 Hyperlipidemia, unspecified: Secondary | ICD-10-CM | POA: Diagnosis not present

## 2013-04-01 DIAGNOSIS — M797 Fibromyalgia: Secondary | ICD-10-CM | POA: Diagnosis present

## 2013-04-01 HISTORY — DX: Serous retinal detachment, unspecified eye: H33.20

## 2013-04-01 HISTORY — DX: Chronic diastolic (congestive) heart failure: I50.32

## 2013-04-01 LAB — CBC WITH DIFFERENTIAL/PLATELET
Basophils Absolute: 0 10*3/uL (ref 0.0–0.1)
Basophils Relative: 0 % (ref 0–1)
Eosinophils Absolute: 0.1 10*3/uL (ref 0.0–0.7)
Eosinophils Relative: 2 % (ref 0–5)
HCT: 41.8 % (ref 36.0–46.0)
Hemoglobin: 13.2 g/dL (ref 12.0–15.0)
LYMPHS PCT: 26 % (ref 12–46)
Lymphs Abs: 1.9 10*3/uL (ref 0.7–4.0)
MCH: 26.1 pg (ref 26.0–34.0)
MCHC: 31.6 g/dL (ref 30.0–36.0)
MCV: 82.8 fL (ref 78.0–100.0)
Monocytes Absolute: 0.5 10*3/uL (ref 0.1–1.0)
Monocytes Relative: 6 % (ref 3–12)
Neutro Abs: 4.8 10*3/uL (ref 1.7–7.7)
Neutrophils Relative %: 66 % (ref 43–77)
PLATELETS: 159 10*3/uL (ref 150–400)
RBC: 5.05 MIL/uL (ref 3.87–5.11)
RDW: 17.2 % — ABNORMAL HIGH (ref 11.5–15.5)
WBC: 7.4 10*3/uL (ref 4.0–10.5)

## 2013-04-01 LAB — COMPREHENSIVE METABOLIC PANEL
ALBUMIN: 3.6 g/dL (ref 3.5–5.2)
ALT: 22 U/L (ref 0–35)
AST: 24 U/L (ref 0–37)
Alkaline Phosphatase: 75 U/L (ref 39–117)
BILIRUBIN TOTAL: 0.2 mg/dL — AB (ref 0.3–1.2)
BUN: 20 mg/dL (ref 6–23)
CO2: 22 mEq/L (ref 19–32)
CREATININE: 0.97 mg/dL (ref 0.50–1.10)
Calcium: 9.2 mg/dL (ref 8.4–10.5)
Chloride: 103 mEq/L (ref 96–112)
GFR calc Af Amer: 64 mL/min — ABNORMAL LOW (ref >90)
GFR calc non Af Amer: 55 mL/min — ABNORMAL LOW (ref >90)
Glucose, Bld: 152 mg/dL — ABNORMAL HIGH (ref 70–99)
Potassium: 3.5 mEq/L — ABNORMAL LOW (ref 3.7–5.3)
Sodium: 143 mEq/L (ref 137–147)
Total Protein: 6.9 g/dL (ref 6.0–8.3)

## 2013-04-01 LAB — PHOSPHORUS: Phosphorus: 3.2 mg/dL (ref 2.3–4.6)

## 2013-04-01 LAB — PROTIME-INR
INR: 2.71 — ABNORMAL HIGH (ref 0.00–1.49)
Prothrombin Time: 27.8 seconds — ABNORMAL HIGH (ref 11.6–15.2)

## 2013-04-01 LAB — MAGNESIUM: MAGNESIUM: 1.5 mg/dL (ref 1.5–2.5)

## 2013-04-01 LAB — PRO B NATRIURETIC PEPTIDE: PRO B NATRI PEPTIDE: 1911 pg/mL — AB (ref 0–450)

## 2013-04-01 LAB — TROPONIN I: Troponin I: <0.3 ng/mL (ref <0.30)

## 2013-04-01 LAB — D-DIMER, QUANTITATIVE (NOT AT ARMC): D DIMER QUANT: 0.4 ug{FEU}/mL (ref 0.00–0.48)

## 2013-04-01 MED ORDER — PANTOPRAZOLE SODIUM 40 MG PO TBEC
40.0000 mg | DELAYED_RELEASE_TABLET | Freq: Every day | ORAL | Status: DC
  Administered 2013-04-02: 40 mg via ORAL
  Filled 2013-04-01: qty 1

## 2013-04-01 MED ORDER — METOPROLOL TARTRATE 1 MG/ML IV SOLN
5.0000 mg | INTRAVENOUS | Status: DC | PRN
  Administered 2013-04-01: 5 mg via INTRAVENOUS
  Filled 2013-04-01: qty 5

## 2013-04-01 MED ORDER — AMLODIPINE BESYLATE 5 MG PO TABS
5.0000 mg | ORAL_TABLET | Freq: Every day | ORAL | Status: DC
  Filled 2013-04-01: qty 1

## 2013-04-01 MED ORDER — ALLOPURINOL 300 MG PO TABS
300.0000 mg | ORAL_TABLET | Freq: Every day | ORAL | Status: DC
  Administered 2013-04-02: 300 mg via ORAL
  Filled 2013-04-01 (×3): qty 1

## 2013-04-01 MED ORDER — FENOFIBRATE 160 MG PO TABS
160.0000 mg | ORAL_TABLET | Freq: Every day | ORAL | Status: DC
  Administered 2013-04-01 – 2013-04-02 (×2): 160 mg via ORAL
  Filled 2013-04-01 (×2): qty 1

## 2013-04-01 MED ORDER — ACETAMINOPHEN 325 MG PO TABS: 650.0000 mg | ORAL_TABLET | Freq: Four times a day (QID) | ORAL | Status: DC | PRN

## 2013-04-01 MED ORDER — OFF THE BEAT BOOK
Freq: Once | Status: DC
  Filled 2013-04-01: qty 1

## 2013-04-01 MED ORDER — SODIUM CHLORIDE 0.9 % IV SOLN
INTRAVENOUS | Status: DC
  Administered 2013-04-02: 04:00:00 via INTRAVENOUS

## 2013-04-01 MED ORDER — POTASSIUM CHLORIDE CRYS ER 20 MEQ PO TBCR
20.0000 meq | EXTENDED_RELEASE_TABLET | Freq: Once | ORAL | Status: AC
  Administered 2013-04-01: 20 meq via ORAL
  Filled 2013-04-01: qty 1

## 2013-04-01 MED ORDER — WARFARIN SODIUM 2.5 MG PO TABS
2.5000 mg | ORAL_TABLET | Freq: Once | ORAL | Status: AC
  Administered 2013-04-01: 2.5 mg via ORAL
  Filled 2013-04-01: qty 1

## 2013-04-01 MED ORDER — METOPROLOL TARTRATE 25 MG PO TABS
25.0000 mg | ORAL_TABLET | Freq: Two times a day (BID) | ORAL | Status: DC
  Administered 2013-04-01 – 2013-04-02 (×2): 25 mg via ORAL
  Filled 2013-04-01 (×3): qty 1

## 2013-04-01 MED ORDER — DILTIAZEM HCL 100 MG IV SOLR
5.0000 mg/h | INTRAVENOUS | Status: DC
  Administered 2013-04-01 – 2013-04-02 (×2): 5 mg/h via INTRAVENOUS
  Filled 2013-04-01 (×2): qty 100

## 2013-04-01 MED ORDER — WARFARIN - PHARMACIST DOSING INPATIENT: Freq: Every day | Status: DC

## 2013-04-01 MED ORDER — METOPROLOL TARTRATE 50 MG PO TABS
50.0000 mg | ORAL_TABLET | Freq: Two times a day (BID) | ORAL | Status: DC
  Filled 2013-04-01: qty 1

## 2013-04-01 NOTE — Consult Note (Signed)
Cardiology Consultation Note  Patient ID: [name], MRN: [id number], DOB/AGE: [birth year] 77 y.o. Admit date: 04/01/2013   Date of Consult: 04/01/2013 Primary Physician: Horatio Pel, MD Primary Cardiologist: Mare Ferrari  Chief Complaint: SOB Reason for Consult: AF-RVR  HPI: [name] is a 79 y/o F with history of recurrent PE (initially in 2011 after hip surgery - neg LE dopplers, again in 2014 for new PE), carotid artery disease followed by Dr. Donnetta Hutching, mild plaque by cath 1994 with negative nuc 2011, OSA on CPAP, obesity, PVCs, fibromyalgia, chronic diastolic CHF who presented to her pulmonary office today for followup with increased dyspnea. She had power failure overnight and woke up to realize her CPAP was nonfunctioning. She felt somewhat "warm" like she usually does when she is "low on oxygen" but was able to fall back asleep. This morning she has noticed more trouble with feeling short of breath and fatigued. She did not feel this way yesterday. She was found to be in rapid AF HR 150's at Dr. Juanetta Gosling office thus was sent to the ER. She says she now can tell her heart feels "Hilaria Ota" but denies any recent symptoms like this - she does report previous remote episodes of brief runs of fast heart rate, but none recently. She denies cough, CP, abdominal symptoms or leg swelling. Does not weigh regularly - weight relatively stable since 2014. INR therapeutic at 2.71, pBNP 1900, CBC ok, K 3.5.    Past Medical History  Diagnosis Date  . Hypertension   . Gout   . Tinnitus   . Acid reflux disease   . Pulmonary embolism 2011    a. Hx of PE in 02/2009 after R hip surgery, venous dopplers negative, long-term Coumadin.  . Vertigo   . Crohn disease   . Long term current use of anticoagulant   . GERD (gastroesophageal reflux disease)   . Recurrent upper respiratory infection (URI)     BRONCHITIS FEB 2013--SLIGHT COUGH NON-PRODUCTIVE NOW  . H/O hiatal hernia   . PVC (premature ventricular  contraction)     PT STATES HX OF PVC'S ON EKG  . Peripheral vascular disease     KNOWN RIGHT INTERNAL CAROTID ARTERY OCCLUSION (NO STROKE)  --40 TO 59% STENOSIS LEFT ICA-FOLLOWED BY DR. EARLY WITH DOPPLER STUDY EVERY 6 MONTHS  . Fibromyalgia   . High triglycerides   . Chronic bronchitis     "get it once/yr" (05/14/2012)  . Obstructive sleep apnea on CPAP   . Crohn's disease   . History of stomach ulcers 1950's  . Osteoarthritis     PAIN AND OA LEF T HIP AND BOTH SHOULDERS ARE BONE ON BONE AND PAINFUL  . Recurrent UTI (urinary tract infection)     "on daily medicine" (05/14/2012)  . Carotid artery occlusion   . Detached retina   . Chronic diastolic CHF (congestive heart failure)     Hypertensive heart disease      Most Recent Cardiac Studies: 2D Echo 05/25/12 - Left ventricle: The cavity size was normal. Wall thickness was normal. Systolic function was normal. The estimated ejection fraction was in the range of 55% to 60%. - Atrial septum: No defect or patent foramen ovale was identified. - Pericardium, extracardiac: Prominant epicardial fat. Small posterior pericardial effusion    Surgical History:  Past Surgical History  Procedure Laterality Date  . Femur fracture surgery Right 02/21/09    Open reduction internal fixation of right periprosthetic  femur fracture utilizing Zimmer cables times fiv  .  Hammer toe surgery Left 10/25/08    "toe next to big to" (05/14/2012)  . Back surgery  10/29/06    Central and foraminal decompression L3-L4, L4-L5, and L5-S1 with inspection of L4-L5 and L5-S1 disc on the right  . Knee arthroscopy Right 07/26/05  . Total hip arthroplasty Right 12/08/02    Osteonics total hip replacement  . Excision morton's neuroma Right 05/20/00    "foot" (05/14/2012)  . Total hip arthroplasty  06/17/2011    Procedure: TOTAL HIP ARTHROPLASTY;  Surgeon: Gearlean Alf, MD;  Location: [facility];  Service: Orthopedics;  Laterality: Left;  . Joint replacement  2009    Right Hip  replacement  . Joint replacement  2012    Right knee replacement  . Joint replacement  06/17/11    Left Hip replacement  . Fracture surgery  2011    Right Femur Fx  . Total shoulder arthroplasty Left 05/14/2012  . Tonsillectomy and adenoidectomy  1958?  [name] Kitchen Appendectomy  1950's  . Cholecystectomy  ~ 1988  . Vaginal hysterectomy  1971  . Dilation and curettage of uterus      "several; from miscarriages" (05/14/2012)  . Decompressive lumbar laminectomy level 3  ~ 2002  . Replacement total knee Right 02/19/2010  . Cardiac catheterization  1970's    "maybe 2" (05/14/2012)  . Cardiac catheterization    . Cataract extraction w/ intraocular lens implant Right 2009  . Total shoulder arthroplasty Left 05/14/2012    Procedure: TOTAL SHOULDER ARTHROPLASTY;  Surgeon: Marin Shutter, MD;  Location: [facility];  Service: Orthopedics;  Laterality: Left;  [name] Kitchen Eye surgery Left 2014    Detached retina     Home Meds: Prior to Admission medications   Medication Sig Start Date End Date Taking? Authorizing Provider  acetaminophen (TYLENOL) 500 MG tablet Take 500 mg by mouth daily as needed for headache.   Yes Historical Provider, MD  allopurinol (ZYLOPRIM) 300 MG tablet Take 300 mg by mouth Daily.  05/10/10  Yes Historical Provider, MD  amLODipine (NORVASC) 5 MG tablet Take 5 mg by mouth Daily.  06/08/10  Yes Historical Provider, MD  Brimonidine Tartrate-Timolol (COMBIGAN OP) Place 1 drop into the right eye 2 (two) times daily. RIGHT EYE   Yes Historical Provider, MD  calcium citrate-vitamin D (CITRACAL+D) 315-200 MG-UNIT per tablet Take 1 tablet by mouth daily.   Yes Historical Provider, MD  CELEBREX 200 MG capsule Take 200 mg by mouth Daily. 08/24/11  Yes Historical Provider, MD  cetirizine (ZYRTEC) 10 MG tablet Take 10 mg by mouth daily.   Yes Historical Provider, MD  cholecalciferol (VITAMIN D) 1000 UNITS tablet Take 3,000 Units by mouth daily.   Yes Historical Provider, MD  diphenhydrAMINE (BENADRYL) 25 mg capsule Take  25 mg by mouth at bedtime as needed for sleep. For sleep   Yes Historical Provider, MD  fenofibrate 160 MG tablet Take 160 mg by mouth every evening.  05/10/10  Yes Historical Provider, MD  HYDROcodone-acetaminophen (NORCO/VICODIN) 5-325 MG per tablet Take 1 tablet by mouth every 6 (six) hours.    Yes Historical Provider, MD  indapamide (LOZOL) 1.25 MG tablet Take 1.25 mg by mouth every morning.   Yes Historical Provider, MD  ketoconazole (NIZORAL) 2 % cream Apply 1 application topically 2 (two) times daily as needed for irritation.  09/26/11  Yes Historical Provider, MD  Magnesium 400 MG CAPS Take 400 mg by mouth daily.  02/19/11  Yes Historical Provider, MD  metoprolol succinate (TOPROL-XL) 25  MG 24 hr tablet Take 12.5 mg by mouth at bedtime.   Yes Historical Provider, MD  Multiple Vitamins-Minerals (MULTIVITAMIN WITH MINERALS) tablet Take 1 tablet by mouth daily.   Yes Historical Provider, MD  omeprazole (PRILOSEC) 40 MG capsule Take 40 mg by mouth daily with supper.  06/08/10  Yes Historical Provider, MD  warfarin (COUMADIN) 2.5 MG tablet Take 2.5 mg by mouth daily with supper.   Yes Historical Provider, MD  furosemide (LASIX) 40 MG tablet Take 20 mg by mouth daily as needed (for fluid retention).    Historical Provider, MD    Inpatient Medications:  . allopurinol  300 mg Oral Daily  . amLODipine  5 mg Oral Daily  . fenofibrate  160 mg Oral Daily  . metoprolol tartrate  50 mg Oral BID  . off the beat book   Does not apply Once  . [START ON 04/02/2013] pantoprazole  40 mg Oral Q1200  . Warfarin - Pharmacist Dosing Inpatient   Does not apply q1800   . sodium chloride      Allergies:  Allergies  Allergen Reactions  . Ciprofloxacin     RASH  . Codeine     NAUSEA  . Cymbalta [Duloxetine Hcl]     Nausea VOMITING AND ABDOMINAL PAIN, HEADACHE, JUST ABOUT EVERY SIDE EFFECT THE DRUG HAS  . Nortriptyline     Nightmares  . Penicillins     RASH  & ITCHING   --PT STATES SHE CAN TAKE KEFLEX PO  AND IV CEPHALOSPORINS  . Statins     Leg cramps  . Tetanus Toxoids     SWELLING, REDNESS  WHOLE ARM  . Lyrica [Pregabalin] Diarrhea, Nausea And Vomiting and Rash    History   Social History  . Marital Status: Married    Spouse Name: N/A    Number of Children: N/A  . Years of Education: N/A   Occupational History  . Not on file.   Social History Main Topics  . Smoking status: Former Smoker -- 0.50 packs/day for 15 years    Types: Cigarettes    Quit date: 02/11/1986  . Smokeless tobacco: Never Used  . Alcohol Use: Yes     Comment: 05/14/2012 "have 2-3 drinks/yr" maybe  . Drug Use: No  . Sexual Activity: No   Other Topics Concern  . Not on file   Social History Narrative   [facility] arranged CP[facility].  Lives in a one level house with two steps two enter house with her husband.  Uses a cane.  Has had OT and an RN for her shoulder service.  [employer].       Family History  Problem Relation Age of Onset  . Heart disease Father     Heart Disease before age 27  . Heart attack Father 93  . Hypertension Father   . Peripheral vascular disease Father     Right  leg amputation  . Heart disease Mother     AAA  . Stroke Mother   . Aneurysm Mother   . Hypertension Mother   . Hypertension Brother   . Heart disease Brother     Heart Disease before age 8  . Hyperlipidemia Brother   . Hypertension Brother   . Diabetes Paternal Grandfather   . Diabetes Son      Review of Systems: General: negative for chills, fever Cardiovascular: see HPI Dermatological: negative for rash Respiratory: dyspnea with exertion Urologic: negative for hematuria Abdominal: negative for nausea, vomiting, diarrhea,  bright red blood per rectum, melena, or hematemesis Neurologic: negative for visual changes, syncope, or dizziness Ortho:  Difficult ambulation due to multiple joint replacements    Other than as noted above, the remainder of the ROS is unremarkable  Labs:  Recent Labs   04/01/13 1640  TROPONINI <0.30   Lab Results  Component Value Date   WBC 7.4 04/01/2013   HGB 13.2 04/01/2013   HCT 41.8 04/01/2013   MCV 82.8 04/01/2013   PLT 159 04/01/2013     Recent Labs Lab 04/01/13 1640  NA 143  K 3.5*  CL 103  CO2 22  BUN 20  CREATININE 0.97  CALCIUM 9.2  PROT 6.9  BILITOT 0.2*  ALKPHOS 75  ALT 22  AST 24  GLUCOSE 152*   Radiology/Studies:  No results found. CXR -IMPRESSION: Chronic cardiomegaly and lung base hypo ventilation, with increased opacity in the left lower lobe. Differential includes pneumonia and worsening atelectasis. (not febrile and no WBC)  EKG: atrial fibrillation RVR 134bpm, downsloping TWI II, III, avF, V3-V6, mild ST depression/TW change I. Aside from AF, EKG not significantly changed  Physical Exam: BP 124/82, HR 130, RR 16, POx 96% General: Well developed, well nourished, extremely obese WF in no acute distress. Head: Normocephalic, atraumatic, sclera non-icteric, no xanthomas, nares are without discharge.  Neck: JVD not elevated. Lungs: Clear bilaterally to auscultation without wheezes, rales, or rhonchi. Breathing is unlabored. Heart: Irregular, tachycardic with S1 S2. No murmurs, rubs, or gallops appreciated. Abdomen: Soft, non-tender, non-distended with normoactive bowel sounds. No hepatomegaly. . No obvious abdominal masses. Msk:  Strength and tone appear normal for age. Extremities: No clubbing or cyanosis. No edema.  Distal pedal pulses are 2+ and equal bilaterally. Neuro: Alert and oriented X 3. No facial asymmetry. No focal deficit. Moves all extremities spontaneously. Psych:  Responds to questions appropriately with a normal affect.   Assessment and Plan:  1. New onset atrial fibrillation with RVR 2. Chronic diastolic CHF 3. OSA on CPAP, with power failure overnight thus CPAP nonfunctioning 4. Morbid obesity  5. H/o recurrent PE, therapeutic on Coumadin 6. Carotid artery disease, followed by  vascular  Suspect episode of AF incited by nocturnal hypoxia from nonfunctioning CPAP. Will change metoprolol to 74m BID (patient has history of "heavy leg" feeling from higher doses so will titrate carefully) Of note she was just reduced by Dr. BMare FerrariA few weeks ago.  Will also place on dilt drip to stabilize HR overnight (DC amlodipine). Echo was done in less than 1 yr. No significant CHF on exam but need to watch carefully given AF-RVR. She is already anticoagulated from her PE. Check TSH. See below for additional thoughts.  Might check a dimer and if elevated consider CTA of chest if elevated.  WKerry HoughMD FBrainerd Lakes [facility]

## 2013-04-01 NOTE — Progress Notes (Signed)
ANTICOAGULATION CONSULT NOTE - Initial Consult  Pharmacy Consult for Coumadin Indication: history of PE and new atrial fibrillation  Allergies  Allergen Reactions  . Ciprofloxacin     RASH  . Codeine     NAUSEA  . Cymbalta [Duloxetine Hcl]     Nausea VOMITING AND ABDOMINAL PAIN, HEADACHE, JUST ABOUT EVERY SIDE EFFECT THE DRUG HAS  . Nortriptyline     Nightmares  . Penicillins     RASH  & ITCHING   --PT STATES SHE CAN TAKE KEFLEX PO AND IV CEPHALOSPORINS  . Statins     Leg cramps  . Tetanus Toxoids     SWELLING, REDNESS  WHOLE ARM  . Lyrica [Pregabalin] Diarrhea, Nausea And Vomiting and Rash   Patient Measurements: Weight 108.4 kg  Height 157 cm Vital Signs: BP: 124/82 mmHg (02/19 1427) Pulse Rate: 130 (02/19 1427) Labs:  Recent Labs  04/01/13 1640  HGB 13.2  HCT 41.8  PLT 159  LABPROT 27.8*  INR 2.71*   The CrCl is unknown because both a height and weight (above a minimum accepted value) are required for this calculation.   Medical History: Past Medical History  Diagnosis Date  . Hypertension   . Gout   . Tinnitus   . Acid reflux disease   . Pulmonary embolism 2011    history of a postsurgical pulmonary embolism; "due to immobility; not OR" (05/14/2012)  . Vertigo   . Crohn disease   . Long term current use of anticoagulant   . GERD (gastroesophageal reflux disease)   . Recurrent upper respiratory infection (URI)     BRONCHITIS FEB 2013--SLIGHT COUGH NON-PRODUCTIVE NOW  . H/O hiatal hernia   . PVC (premature ventricular contraction)     PT STATES HX OF PVC'S ON EKG  . PONV (postoperative nausea and vomiting)   . Peripheral vascular disease     KNOWN RIGHT INTERNAL CAROTID ARTERY OCCLUSION (NO STROKE)  --40 TO 59% STENOSIS LEFT ICA-FOLLOWED BY DR. EARLY WITH DOPPLER STUDY EVERY 6 MONTHS  . Fibromyalgia   . High triglycerides   . Exertional shortness of breath   . Chronic bronchitis     "get it once/yr" (05/14/2012)  . Obstructive sleep apnea on CPAP    . Crohn's disease   . History of stomach ulcers 1950's  . Osteoarthritis     PAIN AND OA LEF T HIP AND BOTH SHOULDERS ARE BONE ON BONE AND PAINFUL  . Recurrent UTI (urinary tract infection)     "on daily medicine" (05/14/2012)  . Carotid artery occlusion   . Dysrhythmia     ATRIAL FIBRILATION    Medications:  Coumadin 2.83m daily  Assessment: [name] on chronic Coumadin for history of pulmonary embolus with new atrial fibrillation. INR today is therapeutic at 2.71. Last dose of Coumadin was on 03/31/13. CBC is within normal limits.   Goal of Therapy:  INR 2-3 Monitor platelets by anticoagulation protocol: Yes   Plan:  1. Coumadin 2.559mpo x1 tonight. 2. Daily PT/INR.   JeSloan LeiterPharmD, BCPS Clinical Pharmacist [phone]/19/2015,5:34 PM

## 2013-04-01 NOTE — Progress Notes (Signed)
Chief Complaint  Patient presents with  . Shortness of Breath    Breathing has been doing well until last night. Reports increased SOB. Power went out at her home last night and CPAP was not working. Feels like this is due to her breathing.    History of Present Illness: [name] is a 79 y.o. female with remote hx of smoking and dyspnea.  She is here to review her PFT.  This showed mild restriction and borderline bronchodilator responsiveness.  She has hx of sleep apnea.  She had power failure overnight, and was unable to use CPAP.  Since this morning she has noticed more trouble with feeling short of breath and fatigued.  She was found to have irregular tachycardia in the office today.  TESTS: CT chest 05/24/12 >> b/l PE  Echo 05/25/12 >> EF 55 to 60% V/Q scan 11/24/12 >> negative for PE CPAP 11/01/12 to 11/30/12 >> Used on 30 of 30 nights with average 9 hrs 32 min. Average AHI 15.8 with CPAP 12 cm H2O with significant air leak noted. ONO with CPAP and RA 11/25/12 >> Test time 6 hrs 33 min. Mean SpO2 90%, low SpO2 80%. Spent 56 min with SpO2 < 88%. PFT 03/16/13 >> FEV1 1.21 (71%), FEV1% 81, TLC 4.45 (73%), DLCO 82%, +BD from FEF 25-75%   [name]  has a past medical history of Hypertension; Gout; Tinnitus; Acid reflux disease; Pulmonary embolism (2011); Vertigo; Crohn disease; Long term current use of anticoagulant; GERD (gastroesophageal reflux disease); Recurrent upper respiratory infection (URI); H/O hiatal hernia; PVC (premature ventricular contraction); PONV (postoperative nausea and vomiting); Peripheral vascular disease; Fibromyalgia; High triglycerides; Exertional shortness of breath; Chronic bronchitis; Obstructive sleep apnea on CPAP; Crohn's disease; History of stomach ulcers (1950's); Osteoarthritis; Recurrent UTI (urinary tract infection); and Carotid artery occlusion.  [name]  has past surgical history that includes Femur fracture surgery (Right, 02/21/09); Hammer  toe surgery (Left, 10/25/08); Back surgery (10/29/06); Knee arthroscopy (Right, 07/26/05); Total hip arthroplasty (Right, 12/08/02); Excision Morton's neuroma (Right, 05/20/00); Total hip arthroplasty (06/17/2011); Joint replacement (2009); Joint replacement (2012); Joint replacement (06/17/11); Fracture surgery (2011); Total shoulder arthroplasty (Left, 05/14/2012); Tonsillectomy and adenoidectomy (1958?); Appendectomy (1950's); Cholecystectomy (~ 1988); Vaginal hysterectomy (1971); Dilation and curettage of uterus; Decompressive lumbar laminectomy level 3 (~ 2002); Replacement total knee (Right, 02/19/2010); Cardiac catheterization (1970's); Cardiac catheterization; Cataract extraction w/ intraocular lens implant (Right, 2009); Total shoulder arthroplasty (Left, 05/14/2012); and Eye surgery (Left, 2014).  Prior to Admission medications   Medication Sig Start Date End Date Taking? Authorizing Provider  allopurinol (ZYLOPRIM) 300 MG tablet Take 300 mg by mouth Daily.  05/10/10  Yes Historical Provider, MD  amLODipine (NORVASC) 5 MG tablet Take 5 mg by mouth Daily.  06/08/10  Yes Historical Provider, MD  Brimonidine Tartrate-Timolol (COMBIGAN OP) Place 1 drop into the right eye 2 (two) times daily. RIGHT EYE   Yes Historical Provider, MD  calcium citrate-vitamin D (CITRACAL+D) 315-200 MG-UNIT per tablet Take 1 tablet by mouth daily.   Yes Historical Provider, MD  CELEBREX 200 MG capsule Take 200 mg by mouth Daily. 08/24/11  Yes Historical Provider, MD  cetirizine (ZYRTEC) 10 MG tablet Take 10 mg by mouth daily.   Yes Historical Provider, MD  cholecalciferol (VITAMIN D) 1000 UNITS tablet Take 3,000 Units by mouth daily.   Yes Historical Provider, MD  diphenhydrAMINE (BENADRYL) 25 mg capsule Take 25 mg by mouth at bedtime as needed for sleep. For sleep   Yes  Historical Provider, MD  fenofibrate 160 MG tablet Take 160 mg by mouth every evening.  05/10/10  Yes Historical Provider, MD  furosemide (LASIX) 40 MG tablet Take 20 mg  by mouth daily as needed (for fluid retention).   Yes Historical Provider, MD  HYDROcodone-acetaminophen (NORCO/VICODIN) 5-325 MG per tablet Take 1 tablet by mouth every 6 (six) hours as needed for pain.   Yes Historical Provider, MD  indapamide (LOZOL) 1.25 MG tablet Take 1.25 mg by mouth every morning.   Yes Historical Provider, MD  ketoconazole (NIZORAL) 2 % cream Apply 1 application topically 2 (two) times daily as needed.  09/26/11  Yes Historical Provider, MD  Magnesium 400 MG CAPS Take 400 mg by mouth daily.  02/19/11  Yes Historical Provider, MD  metoprolol succinate (TOPROL XL) 25 MG 24 hr tablet 1/2 TABLET DAILY 02/17/13  Yes Darlin Coco, MD  Multiple Vitamins-Minerals (MULTIVITAMIN WITH MINERALS) tablet Take 1 tablet by mouth daily.   Yes Historical Provider, MD  omeprazole (PRILOSEC) 40 MG capsule Take 40 mg by mouth daily.  06/08/10  Yes Historical Provider, MD  warfarin (COUMADIN) 2.5 MG tablet TAKE (1) TABLET AS DIRECTED. 03/08/13  Yes Darlin Coco, MD    Allergies  Allergen Reactions  . Ciprofloxacin     RASH  . Codeine     NAUSEA  . Cymbalta [Duloxetine Hcl]     Nausea VOMITING AND ABDOMINAL PAIN, HEADACHE, JUST ABOUT EVERY SIDE EFFECT THE DRUG HAS  . Nortriptyline     Nightmares  . Penicillins     RASH  & ITCHING   --PT STATES SHE CAN TAKE KEFLEX PO AND IV CEPHALOSPORINS  . Statins     Leg cramps  . Tetanus Toxoids     SWELLING, REDNESS  WHOLE ARM  . Lyrica [Pregabalin] Diarrhea, Nausea And Vomiting and Rash     Physical Exam:  General - No distress ENT - No sinus tenderness, no oral exudate, no LAN Cardiac - irregular, tachycardic Chest - No wheeze/rales/dullness Back - No focal tenderness Abd - Soft, non-tender Ext - No edema Neuro - Normal strength Skin - No rashes Psych - normal mood, and behavior   Assessment/Plan:  Chesley Mires, MD [place] [facility]Critical [facility]/Sleep Pager:  [phone]

## 2013-04-01 NOTE — Assessment & Plan Note (Signed)
She is to be admitted to [facility] telemetry unit.  See hospital H&P for details.

## 2013-04-01 NOTE — H&P (Signed)
Name: [name] MRN: [id number] DOB: 23-Nov-1934    ADMISSION DATE:  04/01/2013  PRIMARY CARE: [facility] PRIMARY CARDIOLOGIST: Darlin Coco  CHIEF COMPLAINT:  Short of breath  BRIEF PATIENT DESCRIPTION:  79 yo female followed in pulmonary office for dyspnea developed fatigue and dyspnea x 1 day after power failure at her home and unable to use CPAP.  She was found to have new onset A fib with RVR.  TESTS: CT chest 05/24/12 >> b/l PE  Echo 05/25/12 >> EF 55 to 60%  V/Q scan 11/24/12 >> negative for PE  CPAP 11/01/12 to 11/30/12 >> Used on 30 of 30 nights with average 9 hrs 32 min. Average AHI 15.8 with CPAP 12 cm H2O with significant air leak noted.  ONO with CPAP and RA 11/25/12 >> Test time 6 hrs 33 min. Mean SpO2 90%, low SpO2 80%. Spent 56 min with SpO2 < 88%.  PFT 03/16/13 >> FEV1 1.21 (71%), FEV1% 81, TLC 4.45 (73%), DLCO 82%, +BD from FEF 25-75%   HISTORY OF PRESENT ILLNESS:   79 yo female with remote hx of smoking was initially seen in pulmonary office in October 2014 for evaluation of dyspnea.  She had PFT on 03/16/13 and was scheduled for office follow up to review this.  She has history of sleep apnea and uses CPAP.  She had a power failure at her home yesterday, and was unable to use CPAP.  Since this morning she has noticed feeling more short of breath and fatigued.  She denies palpitations or chest pain.  She has not noticed abdominal symptoms or leg swelling.  She denies cough, wheeze, or sputum.  There is no prior history of irregular heart beats.  PAST MEDICAL HISTORY :  Past Medical History  Diagnosis Date  . Hypertension   . Gout   . Tinnitus   . Acid reflux disease   . Pulmonary embolism 2011    history of a postsurgical pulmonary embolism; "due to immobility; not OR" (05/14/2012)  . Vertigo   . Crohn disease   . Long term current use of anticoagulant   . GERD (gastroesophageal reflux disease)   . Recurrent upper respiratory infection (URI)    BRONCHITIS FEB 2013--SLIGHT COUGH NON-PRODUCTIVE NOW  . H/O hiatal hernia   . PVC (premature ventricular contraction)     PT STATES HX OF PVC'S ON EKG  . PONV (postoperative nausea and vomiting)   . Peripheral vascular disease     KNOWN RIGHT INTERNAL CAROTID ARTERY OCCLUSION (NO STROKE)  --40 TO 59% STENOSIS LEFT ICA-FOLLOWED BY DR. EARLY WITH DOPPLER STUDY EVERY 6 MONTHS  . Fibromyalgia   . High triglycerides   . Exertional shortness of breath   . Chronic bronchitis     "get it once/yr" (05/14/2012)  . Obstructive sleep apnea on CPAP   . Crohn's disease   . History of stomach ulcers 1950's  . Osteoarthritis     PAIN AND OA LEF T HIP AND BOTH SHOULDERS ARE BONE ON BONE AND PAINFUL  . Recurrent UTI (urinary tract infection)     "on daily medicine" (05/14/2012)  . Carotid artery occlusion    Past Surgical History  Procedure Laterality Date  . Femur fracture surgery Right 02/21/09    Open reduction internal fixation of right periprosthetic  femur fracture utilizing Zimmer cables times fiv  . Hammer toe surgery Left 10/25/08    "toe next to big to" (05/14/2012)  . Back surgery  10/29/06  Central and foraminal decompression L3-L4, L4-L5, and L5-S1 with inspection of L4-L5 and L5-S1 disc on the right  . Knee arthroscopy Right 07/26/05  . Total hip arthroplasty Right 12/08/02    Osteonics total hip replacement  . Excision morton's neuroma Right 05/20/00    "foot" (05/14/2012)  . Total hip arthroplasty  06/17/2011    Procedure: TOTAL HIP ARTHROPLASTY;  Surgeon: Gearlean Alf, MD;  Location: [facility];  Service: Orthopedics;  Laterality: Left;  . Joint replacement  2009    Right Hip replacement  . Joint replacement  2012    Right knee replacement  . Joint replacement  06/17/11    Left Hip replacement  . Fracture surgery  2011    Right Femur Fx  . Total shoulder arthroplasty Left 05/14/2012  . Tonsillectomy and adenoidectomy  1958?  [name] Kitchen Appendectomy  1950's  . Cholecystectomy  ~ 1988  . Vaginal  hysterectomy  1971  . Dilation and curettage of uterus      "several; from miscarriages" (05/14/2012)  . Decompressive lumbar laminectomy level 3  ~ 2002  . Replacement total knee Right 02/19/2010  . Cardiac catheterization  1970's    "maybe 2" (05/14/2012)  . Cardiac catheterization    . Cataract extraction w/ intraocular lens implant Right 2009  . Total shoulder arthroplasty Left 05/14/2012    Procedure: TOTAL SHOULDER ARTHROPLASTY;  Surgeon: Marin Shutter, MD;  Location: [facility];  Service: Orthopedics;  Laterality: Left;  [name] Kitchen Eye surgery Left 2014    Detached retina   Prior to Admission medications   Medication Sig Start Date End Date Taking? Authorizing Provider  allopurinol (ZYLOPRIM) 300 MG tablet Take 300 mg by mouth Daily.  05/10/10   Historical Provider, MD  amLODipine (NORVASC) 5 MG tablet Take 5 mg by mouth Daily.  06/08/10   Historical Provider, MD  Brimonidine Tartrate-Timolol (COMBIGAN OP) Place 1 drop into the right eye 2 (two) times daily. RIGHT EYE    Historical Provider, MD  calcium citrate-vitamin D (CITRACAL+D) 315-200 MG-UNIT per tablet Take 1 tablet by mouth daily.    Historical Provider, MD  CELEBREX 200 MG capsule Take 200 mg by mouth Daily. 08/24/11   Historical Provider, MD  cetirizine (ZYRTEC) 10 MG tablet Take 10 mg by mouth daily.    Historical Provider, MD  cholecalciferol (VITAMIN D) 1000 UNITS tablet Take 3,000 Units by mouth daily.    Historical Provider, MD  diphenhydrAMINE (BENADRYL) 25 mg capsule Take 25 mg by mouth at bedtime as needed for sleep. For sleep    Historical Provider, MD  fenofibrate 160 MG tablet Take 160 mg by mouth every evening.  05/10/10   Historical Provider, MD  furosemide (LASIX) 40 MG tablet Take 20 mg by mouth daily as needed (for fluid retention).    Historical Provider, MD  HYDROcodone-acetaminophen (NORCO/VICODIN) 5-325 MG per tablet Take 1 tablet by mouth every 6 (six) hours as needed for pain.    Historical Provider, MD  indapamide (LOZOL)  1.25 MG tablet Take 1.25 mg by mouth every morning.    Historical Provider, MD  ketoconazole (NIZORAL) 2 % cream Apply 1 application topically 2 (two) times daily as needed.  09/26/11   Historical Provider, MD  Magnesium 400 MG CAPS Take 400 mg by mouth daily.  02/19/11   Historical Provider, MD  metoprolol succinate (TOPROL XL) 25 MG 24 hr tablet 1/2 TABLET DAILY 02/17/13   Darlin Coco, MD  Multiple Vitamins-Minerals (MULTIVITAMIN WITH MINERALS) tablet Take 1 tablet by  mouth daily.    Historical Provider, MD  omeprazole (PRILOSEC) 40 MG capsule Take 40 mg by mouth daily.  06/08/10   Historical Provider, MD  warfarin (COUMADIN) 2.5 MG tablet TAKE (1) TABLET AS DIRECTED. 03/08/13   Darlin Coco, MD   Allergies  Allergen Reactions  . Ciprofloxacin     RASH  . Codeine     NAUSEA  . Cymbalta [Duloxetine Hcl]     Nausea VOMITING AND ABDOMINAL PAIN, HEADACHE, JUST ABOUT EVERY SIDE EFFECT THE DRUG HAS  . Nortriptyline     Nightmares  . Penicillins     RASH  & ITCHING   --PT STATES SHE CAN TAKE KEFLEX PO AND IV CEPHALOSPORINS  . Statins     Leg cramps  . Tetanus Toxoids     SWELLING, REDNESS  WHOLE ARM  . Lyrica [Pregabalin] Diarrhea, Nausea And Vomiting and Rash    FAMILY HISTORY:  Family History  Problem Relation Age of Onset  . Heart disease Father     Heart Disease before age 62  . Heart attack Father 68  . Hypertension Father   . Peripheral vascular disease Father     Right  leg amputation  . Heart disease Mother     AAA  . Stroke Mother   . Aneurysm Mother   . Hypertension Mother   . Hypertension Brother   . Heart disease Brother     Heart Disease before age 4  . Hyperlipidemia Brother   . Hypertension Brother   . Diabetes Paternal Grandfather   . Diabetes Son    SOCIAL HISTORY:  reports that she quit smoking about 27 years ago. Her smoking use included Cigarettes. She has a 7.5 pack-year smoking history. She has never used smokeless tobacco. She reports that she  drinks alcohol. She reports that she does not use illicit drugs.  REVIEW OF SYSTEMS:   Negative except above  SUBJECTIVE:   VITAL SIGNS: Pulse Rate:  [130] 130 (02/19 1427) BP: (124)/(82) 124/82 mmHg (02/19 1427) SpO2:  [96 %] 96 % (02/19 1427) Weight:  [239 lb (108.41 kg)] 239 lb (108.41 kg) (02/19 1427)  PHYSICAL EXAMINATION: General: Fatigued Neuro:  Alert, normal strength, CN intact HEENT:  No sinus tenderness, no oral exudate, no LAN Cardiovascular:  Irregular, tachycardic Lungs:  Decreased breath sounds, no wheeze Abdomen:  Obese, soft, non tender Musculoskeletal:  No edema Skin:  No rashes  Labs, CXR pending  ECG from office 04/01/13 - a fib with HR 150  ASSESSMENT / PLAN:  A fib with RVR. Hx of HTN with diastolic heart failure. Hx of Rt carotid artery occlusion. Hx of high triglyceride levels. Hx of PE. Plan: -admit to telemetry  -cardiology consulted -pharmacy to dose coumadin -defer to cardiology about options to control heart rate/rhythm -continue lopressors, amlodipine, fenofibrate, indapamide -f/u cardiac enzymes, CBC, CXR, electrolytes  Dyspnea >> related to obesity, deconditioning, diastolic heart failure, and now A fib with RVR. Plan: -as above  Hx of OSA. Plan: -continue CPAP qhs  Hx of GERD. Plan: -protonix while in hospital  Hx of gout. Plan: -continue allopurinol  I have d/w [facility].  PCCM will admit pt to telemetry.  [facility] will assume primary care from 2/20 and cardiology will consult.  Chesley Mires, MD [facility] 04/01/2013, 4:33 PM Pager:  [phone] After 3pm call: [phone]

## 2013-04-01 NOTE — Progress Notes (Signed)
Name: [name] MRN: [id number] DOB: 11-25-1934    ADMISSION DATE:  04/01/2013  PRIMARY CARE: [facility] PRIMARY CARDIOLOGIST: Darlin Coco  CHIEF COMPLAINT:  Short of breath  BRIEF PATIENT DESCRIPTION:  79 yo female followed in pulmonary office for dyspnea developed fatigue and dyspnea x 1 day after power failure at her home and unable to use CPAP.  She was found to have new onset A fib with RVR.  TESTS: CT chest 05/24/12 >> b/l PE  Echo 05/25/12 >> EF 55 to 60%  V/Q scan 11/24/12 >> negative for PE  CPAP 11/01/12 to 11/30/12 >> Used on 30 of 30 nights with average 9 hrs 32 min. Average AHI 15.8 with CPAP 12 cm H2O with significant air leak noted.  ONO with CPAP and RA 11/25/12 >> Test time 6 hrs 33 min. Mean SpO2 90%, low SpO2 80%. Spent 56 min with SpO2 < 88%.  PFT 03/16/13 >> FEV1 1.21 (71%), FEV1% 81, TLC 4.45 (73%), DLCO 82%, +BD from FEF 25-75%  SUBJECTIVE:  Still fatigued  VITAL SIGNS: Temp:  [97.3 F (36.3 C)] 97.3 F (36.3 C) (02/19 2021) Pulse Rate:  [116-130] 116 (02/19 2021) Resp:  [18] 18 (02/19 2021) BP: (124)/(82) 124/82 mmHg (02/19 1427) SpO2:  [96 %] 96 % (02/19 2021) Weight:  [108.41 kg (239 lb)-108.863 kg (240 lb)] 108.863 kg (240 lb) (02/19 2021)  PHYSICAL EXAMINATION: General: Fatigued, but no distress Neuro:  Alert, normal strength, CN intact HEENT:  No sinus tenderness, no oral exudate, no LAN Cardiovascular:  Irregular, tachycardic Lungs:  Decreased breath sounds, no accessory muscle use  Abdomen:  Obese, soft, non tender Musculoskeletal:  No edema Skin:  No rashes  CBC Recent Labs     04/01/13  1640  WBC  7.4  HGB  13.2  HCT  41.8  PLT  159    Coag's Recent Labs     04/01/13  1640  INR  2.71*    BMET Recent Labs     04/01/13  1640  NA  143  K  3.5*  CL  103  CO2  22  BUN  20  CREATININE  0.97  GLUCOSE  152*    Electrolytes Recent Labs     04/01/13  1640  CALCIUM  9.2  MG  1.5  PHOS  3.2    Sepsis  Markers No results found for this basename: LACTICACIDVEN, PROCALCITON, O2SATVEN,  in the last 72 hours  ABG No results found for this basename: PHART, PCO2ART, PO2ART,  in the last 72 hours  Liver Enzymes Recent Labs     04/01/13  1640  AST  24  ALT  22  ALKPHOS  75  BILITOT  0.2*  ALBUMIN  3.6    Cardiac Enzymes Recent Labs     04/01/13  1640  TROPONINI  <0.30  PROBNP  1911.0*    Glucose No results found for this basename: GLUCAP,  in the last 72 hours  Imaging Dg Chest Port 1 View  04/01/2013   CLINICAL DATA:  79 year old female with shortness of Breath. Initial encounter.  EXAM: PORTABLE CHEST - 1 VIEW  COMPARISON:  11/24/2012 and earlier.  FINDINGS: Portable AP semi upright view at is 1746 hr. Stable cardiomegaly and mediastinal contours. Chronic lung base hypo ventilation. Increased obscuration of the left hemidiaphragm. No pneumothorax. No large effusion. Pulmonary vascularity appears stable. Chronic changes at both shoulders.  IMPRESSION: Chronic cardiomegaly and lung base hypo ventilation, with increased opacity in  the left lower lobe. Differential includes pneumonia and worsening atelectasis.   Electronically Signed   By: Lars Pinks M.D.   On: 04/01/2013 18:32      ASSESSMENT / PLAN:  A fib with RVR--> HR still in 130-140s, nursing staff adjusting CCB gtt. BB dose adjusted. Agree certainly possible demand of nocturnal hypoxia was triggering factor. Appreciate cardiology input.  Hx of HTN with diastolic heart failure. Hx of Rt carotid artery occlusion. Hx of high triglyceride levels. Hx of PE. Plan: -pharmacy to dose coumadin -continue lopressors, titrate CCB gtt.  -f/u TSH   Dyspnea >> related to obesity, LLL atelectasis, deconditioning, diastolic heart failure, and now A fib with RVR.  Plan: -as above  Hx of OSA. Plan: -continue CPAP qhs  Mild hypokalemia Plan Cards replaced  Hx of GERD. Plan: -protonix while in hospital  Hx of  gout. Plan: -continue allopurinol  Marni Griffon ACNP-BC [place] Pager # [phone] OR # [phone] if no answer

## 2013-04-02 ENCOUNTER — Other Ambulatory Visit: Payer: Self-pay

## 2013-04-02 DIAGNOSIS — E785 Hyperlipidemia, unspecified: Secondary | ICD-10-CM

## 2013-04-02 DIAGNOSIS — R799 Abnormal finding of blood chemistry, unspecified: Secondary | ICD-10-CM

## 2013-04-02 DIAGNOSIS — I5032 Chronic diastolic (congestive) heart failure: Secondary | ICD-10-CM | POA: Diagnosis not present

## 2013-04-02 DIAGNOSIS — I4891 Unspecified atrial fibrillation: Secondary | ICD-10-CM | POA: Diagnosis not present

## 2013-04-02 DIAGNOSIS — N390 Urinary tract infection, site not specified: Secondary | ICD-10-CM | POA: Diagnosis not present

## 2013-04-02 DIAGNOSIS — I5033 Acute on chronic diastolic (congestive) heart failure: Secondary | ICD-10-CM

## 2013-04-02 DIAGNOSIS — I509 Heart failure, unspecified: Secondary | ICD-10-CM

## 2013-04-02 LAB — URINALYSIS, ROUTINE W REFLEX MICROSCOPIC
BILIRUBIN URINE: NEGATIVE
GLUCOSE, UA: NEGATIVE mg/dL
Ketones, ur: NEGATIVE mg/dL
Nitrite: NEGATIVE
PH: 6 (ref 5.0–8.0)
Protein, ur: NEGATIVE mg/dL
SPECIFIC GRAVITY, URINE: 1.014 (ref 1.005–1.030)
Urobilinogen, UA: 0.2 mg/dL (ref 0.0–1.0)

## 2013-04-02 LAB — TSH: TSH: 2.222 u[IU]/mL (ref 0.350–4.500)

## 2013-04-02 LAB — URINE MICROSCOPIC-ADD ON

## 2013-04-02 LAB — TROPONIN I
Troponin I: <0.3 ng/mL (ref <0.30)
Troponin I: <0.3 ng/mL (ref <0.30)

## 2013-04-02 LAB — PROTIME-INR
INR: 2.5 — ABNORMAL HIGH (ref 0.00–1.49)
Prothrombin Time: 26.2 seconds — ABNORMAL HIGH (ref 11.6–15.2)

## 2013-04-02 MED ORDER — DILTIAZEM HCL 60 MG PO TABS: 60.0000 mg | ORAL_TABLET | Freq: Three times a day (TID) | ORAL | Status: DC

## 2013-04-02 MED ORDER — DEXTROSE 5 % IV SOLN
1.0000 g | INTRAVENOUS | Status: DC
  Administered 2013-04-02: 1 g via INTRAVENOUS
  Filled 2013-04-02: qty 10

## 2013-04-02 MED ORDER — CEFUROXIME AXETIL 500 MG PO TABS: 500.0000 mg | ORAL_TABLET | Freq: Two times a day (BID) | ORAL | Status: DC

## 2013-04-02 MED ORDER — DILTIAZEM HCL 60 MG PO TABS
60.0000 mg | ORAL_TABLET | Freq: Three times a day (TID) | ORAL | Status: DC
  Administered 2013-04-02: 60 mg via ORAL
  Filled 2013-04-02 (×3): qty 1

## 2013-04-02 MED ORDER — HYDROCODONE-ACETAMINOPHEN 5-325 MG PO TABS
1.0000 | ORAL_TABLET | Freq: Four times a day (QID) | ORAL | Status: DC | PRN
  Administered 2013-04-02 (×2): 1 via ORAL
  Filled 2013-04-02 (×2): qty 1

## 2013-04-02 MED ORDER — HYDROCODONE-ACETAMINOPHEN 5-325 MG PO TABS: 1.0000 | ORAL_TABLET | Freq: Four times a day (QID) | ORAL | Status: DC

## 2013-04-02 NOTE — Progress Notes (Signed)
Patient Name: [name] Date of Encounter: 04/02/2013  Principal Problem:   Atrial fibrillation with RVR Active Problems:   Fibromyalgia   Occlusion and stenosis of carotid artery without mention of cerebral infarction   H/o recurrent pulmonary embolism, on Coumadin   Chronic diastolic CHF (congestive heart failure)   Morbid obesity   OSA on CPAP   Length of Stay: 1  SUBJECTIVE  Feels back to normal. AF with well controlled rate  CURRENT MEDS . allopurinol  300 mg Oral Daily  . cefTRIAXone (ROCEPHIN)  IV  1 g Intravenous Q24H  . diltiazem  60 mg Oral 3 times per day  . fenofibrate  160 mg Oral Daily  . metoprolol tartrate  25 mg Oral BID  . off the beat book   Does not apply Once  . pantoprazole  40 mg Oral Q1200  . Warfarin - Pharmacist Dosing Inpatient   Does not apply q1800    OBJECTIVE  No intake or output data in the 24 hours ending 04/02/13 1120 Filed Weights   04/01/13 2021  Weight: 108.863 kg (240 lb)    PHYSICAL EXAM Filed Vitals:   04/01/13 2021 04/02/13 0014 04/02/13 0458  BP:  125/88 141/71  Pulse: 116 109 72  Temp: 97.3 F (36.3 C)  97.7 F (36.5 C)  TempSrc: Oral  Oral  Resp: 18 16 16   Height: 5' 2"  (1.575 m)    Weight: 108.863 kg (240 lb)    SpO2: 96% 98% 94%   General: Alert, oriented x3, no distress Head: no evidence of trauma, PERRL, EOMI, no exophtalmos or lid lag, no myxedema, no xanthelasma; normal ears, nose and oropharynx Neck: normal jugular venous pulsations and no hepatojugular reflux; brisk carotid pulses without delay and no carotid bruits Chest: clear to auscultation, no signs of consolidation by percussion or palpation, normal fremitus, symmetrical and full respiratory excursions Cardiovascular: normal position and quality of the apical impulse, irregular rhythm, normal first and second heart sounds, no rubs or gallops, no murmur Abdomen: no tenderness or distention, no masses by palpation, no abnormal pulsatility or  arterial bruits, normal bowel sounds, no hepatosplenomegaly Extremities: no clubbing, cyanosis or edema; 2+ radial, ulnar and brachial pulses bilaterally; 2+ right femoral, posterior tibial and dorsalis pedis pulses; 2+ left femoral, posterior tibial and dorsalis pedis pulses; no subclavian or femoral bruits Neurological: grossly nonfocal  LABS  CBC  Recent Labs  04/01/13 1640  WBC 7.4  NEUTROABS 4.8  HGB 13.2  HCT 41.8  MCV 82.8  PLT 025   Basic Metabolic Panel  Recent Labs  04/01/13 1640  NA 143  K 3.5*  CL 103  CO2 22  GLUCOSE 152*  BUN 20  CREATININE 0.97  CALCIUM 9.2  MG 1.5  PHOS 3.2   Liver Function Tests  Recent Labs  04/01/13 1640  AST 24  ALT 22  ALKPHOS 75  BILITOT 0.2*  PROT 6.9  ALBUMIN 3.6   No results found for this basename: LIPASE, AMYLASE,  in the last 72 hours Cardiac Enzymes  Recent Labs  04/01/13 1640 04/01/13 2315 04/02/13 0400  TROPONINI <0.30 <0.30 <0.30   BNP No components found with this basename: POCBNP,  D-Dimer  Recent Labs  04/01/13 2315  DDIMER 0.40     TELE AF ventricular rate 70-80 bpm   ASSESSMENT AND PLAN Switch to PO diltiazem (immediate release for now, until we titrate to a desired heart rate effect). Stop amlodipine. If rate control remains satisfactory, DC home later  this afternoon, whether or not she remains in AF. She is well anticoagulated. It is possible that she has paroxysmal AF off and on and early cardioversion may not serve a purpose.  Schedule an early follow up with Dr. Mare Ferrari (next week). If still in AF, may choose to schedule an elective outpatient electrical cardioversion.   Sanda Klein, MD, [facility] [phone] office [phone] pager 04/02/2013 11:20 AM

## 2013-04-02 NOTE — Discharge Summary (Signed)
Physician Discharge Summary  [name] CBJ:[id number] DOB: 08/06/1934 DOA: 04/01/2013  PCP: Horatio Pel, MD  Admit date: 04/01/2013 Discharge date: 04/02/2013  Time spent: 40 minutes  Recommendations for Outpatient Follow-up:  1. Followup with Dr. Mare Ferrari on 04/09/2013.  Discharge Diagnoses:  Principal Problem:   Atrial fibrillation with RVR Active Problems:   Fibromyalgia   Occlusion and stenosis of carotid artery without mention of cerebral infarction   H/o recurrent pulmonary embolism, on Coumadin   Chronic diastolic CHF (congestive heart failure)   Morbid obesity   OSA on CPAP   Discharge Condition: Stable  Diet recommendation: Heart healthy diet  Filed Weights   04/01/13 2021  Weight: 108.863 kg (240 lb)    History of present illness:  79 yo female followed in pulmonary office for dyspnea developed fatigue and dyspnea x 1 day after power failure at her home and unable to use CPAP. She was found to have new onset A fib with RVR.  Hospital Course:   1. Atrial fibrillation with RVR: Patient presented to her primary pulmonologist office yesterday with shortness of breath, found to be on atrial fibrillation with a rapid ventricular response. Patient sent to the hospital for further evaluation. Patient seen by cardiology started on Cardizem drip, heart rate is controlled and patient became asymptomatic. Cardiology recommended 60 mg of instant release diltiazem. She was on amlodipine that was discontinued, metoprolol continued. Patient to followup with primary cardiologist within one week.  2. UTI: Patient felt UTI symptoms, urinalysis showed too numerous to count wbc's, patient given one dose of IV Rocephin in the hospital and switched to oral Ceftin on discharge to complete 5 days of antibiotics. Culture pending.  3. Dyspnea: Related to obesity, deconditioning, diastolic CHF and currently atrial fibrillation with RVR. This is resolved after the heart rate  controlled.  4. OSA: Continue CPAP at night.  Procedures:  None  Consultations:  [facility]  Discharge Exam: Filed Vitals:   04/02/13 0458  BP: 141/71  Pulse: 72  Temp: 97.7 F (36.5 C)  Resp: 16   General: Alert and awake, oriented x3, not in any acute distress. HEENT: anicteric sclera, pupils reactive to light and accommodation, EOMI CVS: S1-S2 clear, no murmur rubs or gallops Chest: clear to auscultation bilaterally, no wheezing, rales or rhonchi Abdomen: soft nontender, nondistended, normal bowel sounds, no organomegaly Extremities: no cyanosis, clubbing or edema noted bilaterally Neuro: Cranial nerves II-XII intact, no focal neurological deficits  Discharge Instructions   Future Appointments Provider Department Dept Phone   04/09/2013 11:15 AM Darlin Coco, MD [place] [phone]   06/17/2013 1:45 PM Darlin Coco, MD [place] [phone]   09/15/2013 10:00 AM [facility] [place] [facility] [address] [phone]   09/15/2013 11:00 AM Sharmon Leyden Nickel, NP [facility] [phone]       Medication List    STOP taking these medications       amLODipine 5 MG tablet  Commonly known as:  NORVASC      TAKE these medications       acetaminophen 500 MG tablet  Commonly known as:  TYLENOL  Take 500 mg by mouth daily as needed for headache.     allopurinol 300 MG tablet  Commonly known as:  ZYLOPRIM  Take 300 mg by mouth Daily.     calcium citrate-vitamin D 315-200 MG-UNIT per tablet  Commonly known as:  CITRACAL+D  Take 1 tablet by mouth daily.  cefUROXime 500 MG tablet  Commonly known as:  CEFTIN  Take 1 tablet (500 mg total) by mouth 2 (two) times daily with a meal.     CELEBREX 200 MG capsule  Generic drug:  celecoxib  Take 200 mg by mouth Daily.     cetirizine 10 MG tablet  Commonly known as:  ZYRTEC  Take 10 mg by mouth daily.      cholecalciferol 1000 UNITS tablet  Commonly known as:  VITAMIN D  Take 3,000 Units by mouth daily.     COMBIGAN OP  Place 1 drop into the right eye 2 (two) times daily. RIGHT EYE     diltiazem 60 MG tablet  Commonly known as:  CARDIZEM  Take 1 tablet (60 mg total) by mouth every 8 (eight) hours.     diphenhydrAMINE 25 mg capsule  Commonly known as:  BENADRYL  Take 25 mg by mouth at bedtime as needed for sleep. For sleep     fenofibrate 160 MG tablet  Take 160 mg by mouth every evening.     furosemide 40 MG tablet  Commonly known as:  LASIX  Take 20 mg by mouth daily as needed (for fluid retention).     HYDROcodone-acetaminophen 5-325 MG per tablet  Commonly known as:  NORCO/VICODIN  Take 1 tablet by mouth every 6 (six) hours.     indapamide 1.25 MG tablet  Commonly known as:  LOZOL  Take 1.25 mg by mouth every morning.     ketoconazole 2 % cream  Commonly known as:  NIZORAL  Apply 1 application topically 2 (two) times daily as needed for irritation.     Magnesium 400 MG Caps  Take 400 mg by mouth daily.     metoprolol succinate 25 MG 24 hr tablet  Commonly known as:  TOPROL-XL  Take 12.5 mg by mouth at bedtime.     multivitamin with minerals tablet  Take 1 tablet by mouth daily.     omeprazole 40 MG capsule  Commonly known as:  PRILOSEC  Take 40 mg by mouth daily with supper.     warfarin 2.5 MG tablet  Commonly known as:  COUMADIN  Take 2.5 mg by mouth daily with supper.       Allergies  Allergen Reactions  . Ciprofloxacin     RASH  . Codeine     NAUSEA  . Cymbalta [Duloxetine Hcl]     Nausea VOMITING AND ABDOMINAL PAIN, HEADACHE, JUST ABOUT EVERY SIDE EFFECT THE DRUG HAS  . Nortriptyline     Nightmares  . Penicillins     RASH  & ITCHING   --PT STATES SHE CAN TAKE KEFLEX PO AND IV CEPHALOSPORINS  . Statins     Leg cramps  . Tetanus Toxoids     SWELLING, REDNESS  WHOLE ARM  . Lyrica [Pregabalin] Diarrhea, Nausea And Vomiting and Rash        Follow-up Information   Follow up with Darlin Coco, MD On 04/09/2013. (11:15)    Specialty:  Cardiology   Contact information:   [place] [address] [phone]        The results of significant diagnostics from this hospitalization (including imaging, microbiology, ancillary and laboratory) are listed below for reference.    Significant Diagnostic Studies: Dg Chest Port 1 View  04/01/2013   CLINICAL DATA:  79 year old female with shortness of Breath. Initial encounter.  EXAM: PORTABLE CHEST - 1 VIEW  COMPARISON:  11/24/2012 and  earlier.  FINDINGS: Portable AP semi upright view at is 1746 hr. Stable cardiomegaly and mediastinal contours. Chronic lung base hypo ventilation. Increased obscuration of the left hemidiaphragm. No pneumothorax. No large effusion. Pulmonary vascularity appears stable. Chronic changes at both shoulders.  IMPRESSION: Chronic cardiomegaly and lung base hypo ventilation, with increased opacity in the left lower lobe. Differential includes pneumonia and worsening atelectasis.   Electronically Signed   By: Lars Pinks M.D.   On: 04/01/2013 18:32    Microbiology: No results found for this or any previous visit (from the past 240 hour(s)).   Labs: Basic Metabolic Panel:  Recent Labs Lab 04/01/13 1640  NA 143  K 3.5*  CL 103  CO2 22  GLUCOSE 152*  BUN 20  CREATININE 0.97  CALCIUM 9.2  MG 1.5  PHOS 3.2   Liver Function Tests:  Recent Labs Lab 04/01/13 1640  AST 24  ALT 22  ALKPHOS 75  BILITOT 0.2*  PROT 6.9  ALBUMIN 3.6   No results found for this basename: LIPASE, AMYLASE,  in the last 168 hours No results found for this basename: AMMONIA,  in the last 168 hours CBC:  Recent Labs Lab 04/01/13 1640  WBC 7.4  NEUTROABS 4.8  HGB 13.2  HCT 41.8  MCV 82.8  PLT 159   Cardiac Enzymes:  Recent Labs Lab 04/01/13 1640 04/01/13 2315 04/02/13 0400  TROPONINI <0.30 <0.30 <0.30   BNP: BNP (last 3  results)  Recent Labs  05/24/12 1457 04/01/13 1640  PROBNP 1433.0* 1911.0*   CBG: No results found for this basename: GLUCAP,  in the last 168 hours     Signed:  Delma Villalva A  [facility] 04/02/2013, 12:29 PM

## 2013-04-02 NOTE — Progress Notes (Signed)
Pt. States she feels as though she has a UTI.  Since pt has a history of recurrent UTIs, this RN ordered a UA.  Will send once urine sample is obtained.

## 2013-04-02 NOTE — Care Management Note (Unsigned)
    Page 1 of 1   04/02/2013     11:36:53 AM   CARE MANAGEMENT NOTE 04/02/2013  Patient:  [name], [name]   Account Number:  [id number]  Date Initiated:  04/02/2013  Documentation initiated by:  GRAVES-BIGELOW,Duward Allbritton  Subjective/Objective Assessment:   Pt admitted for SOB new onset afib RVR.     Action/Plan:   CM will continue to monitor for disposition needs. Pt may benefit from [facility] for CHF management. Pt has used [place] in the past.   Anticipated DC Date:  04/04/2013   Anticipated DC Plan:  [place]  CM consult      Choice offered to / List presented to:             Status of service:  In process, will continue to follow Medicare Important Message given?   (If response is "NO", the following Medicare IM given date fields will be blank) Date Medicare IM given:   Date Additional Medicare IM given:    Discharge Disposition:    Per UR Regulation:  Reviewed for med. necessity/level of care/duration of stay  If discussed at [place] of Stay Meetings, dates discussed:    Comments:

## 2013-04-02 NOTE — Progress Notes (Signed)
UR Completed Ileigh Mettler Graves-Bigelow, RN,BSN [phone]  

## 2013-04-02 NOTE — Progress Notes (Signed)
Pt set up on cpap full face mask 11 CM H20. Tolerating well

## 2013-04-04 LAB — URINE CULTURE: Colony Count: >=100000

## 2013-04-09 ENCOUNTER — Encounter: Payer: Self-pay | Admitting: Cardiology

## 2013-04-09 ENCOUNTER — Encounter (INDEPENDENT_AMBULATORY_CARE_PROVIDER_SITE_OTHER): Payer: Self-pay

## 2013-04-09 ENCOUNTER — Ambulatory Visit (INDEPENDENT_AMBULATORY_CARE_PROVIDER_SITE_OTHER): Payer: Medicare Other | Admitting: Cardiology

## 2013-04-09 ENCOUNTER — Ambulatory Visit (INDEPENDENT_AMBULATORY_CARE_PROVIDER_SITE_OTHER): Payer: Medicare Other | Admitting: Pharmacist

## 2013-04-09 ENCOUNTER — Encounter: Payer: Self-pay | Admitting: *Deleted

## 2013-04-09 VITALS — BP 170/66 | HR 66 | Ht 62.0 in | Wt 242.0 lb

## 2013-04-09 DIAGNOSIS — I4891 Unspecified atrial fibrillation: Secondary | ICD-10-CM | POA: Diagnosis not present

## 2013-04-09 DIAGNOSIS — I5032 Chronic diastolic (congestive) heart failure: Secondary | ICD-10-CM

## 2013-04-09 DIAGNOSIS — I2699 Other pulmonary embolism without acute cor pulmonale: Secondary | ICD-10-CM

## 2013-04-09 DIAGNOSIS — E876 Hypokalemia: Secondary | ICD-10-CM | POA: Diagnosis not present

## 2013-04-09 DIAGNOSIS — I6529 Occlusion and stenosis of unspecified carotid artery: Secondary | ICD-10-CM | POA: Diagnosis not present

## 2013-04-09 DIAGNOSIS — I509 Heart failure, unspecified: Secondary | ICD-10-CM

## 2013-04-09 LAB — BASIC METABOLIC PANEL
BUN: 17 mg/dL (ref 6–23)
CO2: 27 meq/L (ref 19–32)
Calcium: 9.5 mg/dL (ref 8.4–10.5)
Chloride: 104 mEq/L (ref 96–112)
Creatinine, Ser: 0.9 mg/dL (ref 0.4–1.2)
GFR: 65.42 mL/min (ref >60.00)
GLUCOSE: 123 mg/dL — AB (ref 70–99)
POTASSIUM: 4.3 meq/L (ref 3.5–5.1)
SODIUM: 140 meq/L (ref 135–145)

## 2013-04-09 LAB — POCT INR: INR: 3.3

## 2013-04-09 MED ORDER — DILTIAZEM HCL ER COATED BEADS 180 MG PO CP24: 180.0000 mg | ORAL_CAPSULE | Freq: Every day | ORAL | Status: DC

## 2013-04-09 NOTE — Progress Notes (Signed)
[name] Date of Birth:  Feb 28, 1934 [address] [place], [place]  [address] [phone]         Fax   [phone]  History of Present Illness: This pleasant 79 year old woman is seen for a post hospital office visit.  The patient was admitted last week as an emergency because of new onset atrial fibrillation.  She was already on long-term Coumadin.  Diltiazem for rate control was added to her regimen in the hospital.  The patient has a past history of pulmonary emboli and is on long-term Coumadin. Her INR has been stable. She has a history of essential hypertension and a history of right carotid artery occlusion. She has a past history of pulmonary emboli in January 2011 after right hip surgery and at that time venous Dopplers of the legs were negative for a source of embolization and she has remained on long-term Coumadin. 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 Donnetta Hutching. She does not have any significant coronary disease and she had cardiac catheterization in 1994 showing only mild plaque and she had a nuclear stress test in December 2011 showing no ischemia and her ejection fraction was 78%.  Earlier this year she had left total shoulder replacement surgery by Dr. Onnie Graham.  In October she saw pulmonary because of exertional dyspnea.  She had a chest x-ray on 11/24/12 showing cardiomegaly.  She had a VQ scan which was negative for new pulmonary emboli.  The patient had an echocardiogram on 05/25/12 which showed an ejection fraction of 55-60% and no significant valve abnormalities.  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  This occurred over the Thanksgiving holiday.  Her surgery had to be delayed a week in order to allow her supratherapeutic INR to correct itself.  Her vision is still not back to baseline in the left eye.  She anticipates that  she will need to have cataract surgery later this spring.  Current Outpatient Prescriptions  Medication Sig Dispense Refill  . acetaminophen (TYLENOL) 500 MG tablet Take 500 mg by mouth daily as needed for headache.      . allopurinol (ZYLOPRIM) 300 MG tablet Take 300 mg by mouth Daily.       . Brimonidine Tartrate-Timolol (COMBIGAN OP) Place 1 drop into the right eye 2 (two) times daily. RIGHT EYE      . calcium citrate-vitamin D (CITRACAL+D) 315-200 MG-UNIT per tablet Take 1 tablet by mouth daily.      . CELEBREX 200 MG capsule Take 200 mg by mouth Daily.      . cetirizine (ZYRTEC) 10 MG tablet Take 10 mg by mouth daily.      . cholecalciferol (VITAMIN D) 1000 UNITS tablet Take 3,000 Units by mouth daily.      [name] Kitchen diltiazem (CARDIZEM) 60 MG tablet Take 1 tablet (60 mg total) by mouth every 8 (eight) hours.  90 tablet  0  . diphenhydrAMINE (BENADRYL) 25 mg capsule Take 25 mg by mouth at bedtime as needed for sleep. For sleep      . fenofibrate 160 MG tablet Take 160 mg by mouth every evening.       . furosemide (LASIX) 40 MG tablet Take 20 mg by mouth daily as needed (for fluid retention).      [name] Kitchen HYDROcodone-acetaminophen (NORCO/VICODIN) 5-325 MG per tablet  Take 1 tablet by mouth every 6 (six) hours.       . indapamide (LOZOL) 1.25 MG tablet Take 1.25 mg by mouth every morning.      [name] Kitchen ketoconazole (NIZORAL) 2 % cream Apply 1 application topically 2 (two) times daily as needed for irritation.       . Magnesium 400 MG CAPS Take 400 mg by mouth daily.       . metoprolol succinate (TOPROL-XL) 25 MG 24 hr tablet Take 12.5 mg by mouth at bedtime.      . Multiple Vitamins-Minerals (MULTIVITAMIN WITH MINERALS) tablet Take 1 tablet by mouth daily.      [name] Kitchen omeprazole (PRILOSEC) 40 MG capsule Take 40 mg by mouth daily with supper.       . warfarin (COUMADIN) 2.5 MG tablet Take 2.5 mg by mouth daily with supper.      . diltiazem (CARDIZEM CD) 180 MG 24 hr capsule Take 1 capsule (180 mg total) by mouth  daily.  90 capsule  3  . [DISCONTINUED] amitriptyline (ELAVIL) 10 MG tablet Take 10 mg by mouth at bedtime.        No current facility-administered medications for this visit.    Allergies  Allergen Reactions  . Ciprofloxacin     RASH  . Codeine     NAUSEA  . Cymbalta [Duloxetine Hcl]     Nausea VOMITING AND ABDOMINAL PAIN, HEADACHE, JUST ABOUT EVERY SIDE EFFECT THE DRUG HAS  . Nortriptyline     Nightmares  . Penicillins     RASH  & ITCHING   --PT STATES SHE CAN TAKE KEFLEX PO AND IV CEPHALOSPORINS  . Statins     Leg cramps  . Tetanus Toxoids     SWELLING, REDNESS  WHOLE ARM  . Lyrica [Pregabalin] Diarrhea, Nausea And Vomiting and Rash    Patient Active Problem List   Diagnosis Date Noted  . Atrial fibrillation with RVR 04/01/2013  . Morbid obesity 04/01/2013  . OSA on CPAP 04/01/2013  . Chronic diastolic CHF (congestive heart failure)   . H/o recurrent pulmonary embolism, on Coumadin 05/24/2012  . Acute on chronic diastolic congestive heart failure 05/24/2012  . Normocytic anemia 05/24/2012  . Thrombocytopenia 05/24/2012  . Inadequate anticoagulation 05/24/2012  . Elevated brain natriuretic peptide (BNP) level 05/24/2012  . Shoulder arthritis 05/15/2012  . Occlusion and stenosis of carotid artery without mention of cerebral infarction 09/10/2011  . OA (osteoarthritis) of hip 06/17/2011  . Osteoarthritis 01/08/2011  . Fibromyalgia 08/06/2010  . Dyslipidemia 08/06/2010  . Gout 08/06/2010  . Right carotid artery occlusion 08/06/2010    History  Smoking status  . Former Smoker -- 0.50 packs/day for 15 years  . Types: Cigarettes  . Quit date: 02/11/1986  Smokeless tobacco  . Never Used    History  Alcohol Use  . Yes    Comment: 05/14/2012 "have 2-3 drinks/yr" maybe    Family History  Problem Relation Age of Onset  . Heart disease Father     Heart Disease before age 62  . Heart attack Father 14  . Hypertension Father   . Peripheral vascular disease Father      Right  leg amputation  . Heart disease Mother     AAA  . Stroke Mother   . Aneurysm Mother   . Hypertension Mother   . Hypertension Brother   . Heart disease Brother     Heart Disease before age 50  . Hyperlipidemia Brother   . Hypertension Brother   .  Diabetes Paternal Grandfather   . Diabetes Son     Review of Systems: Constitutional: no fever chills diaphoresis or fatigue or change in weight.  Head and neck: no hearing loss, no epistaxis, no photophobia or visual disturbance. Respiratory: No cough, shortness of breath or wheezing. Cardiovascular: No chest pain peripheral edema, palpitations. Gastrointestinal: No abdominal distention, no abdominal pain, no change in bowel habits hematochezia or melena. Genitourinary: No dysuria, no frequency, no urgency, no nocturia. Musculoskeletal:No arthralgias, no back pain, no gait disturbance or myalgias. Neurological: No dizziness, no headaches, no numbness, no seizures, no syncope, no weakness, no tremors. Hematologic: No lymphadenopathy, no easy bruising. Psychiatric: No confusion, no hallucinations, no sleep disturbance.    Physical Exam: Filed Vitals:   04/09/13 1101  BP: 170/66  Pulse: 66   the general appearance reveals a large middle-aged woman in no distress.The head and neck exam reveals pupils equal and reactive.  Extraocular movements are full.  There is no scleral icterus.  The mouth and pharynx are normal.  The neck is supple.  The carotids reveal no bruits.  The jugular venous pressure is normal.  The  thyroid is not enlarged.  There is no lymphadenopathy.  The chest is clear to percussion and auscultation.  There are no rales or rhonchi.  Expansion of the chest is symmetrical.  The precordium is quiet.  The first heart sound is normal.  The second heart sound is physiologically split.  There is no murmur gallop rub or click.  There is no abnormal lift or heave.  The abdomen is soft and nontender.  The bowel sounds are  normal.  The liver and spleen are not enlarged.  There are no abdominal masses.  There are no abdominal bruits.  Extremities reveal good pedal pulses.  There is no phlebitis or edema.  There is no cyanosis or clubbing.  Strength is normal and symmetrical in all extremities.  There is no lateralizing weakness.  There are no sensory deficits.  The skin is warm and dry.  There is no rash.  EKG today shows normal sinus rhythm at 62 per minute.   Assessment / Plan: Continue same medication except switch to diltiazem CD 180 one daily.  Use short-acting diltiazem 60 mg when necessary for breakthrough atrial fibrillation. Check a basal metabolic panel today.  Update her two-dimensional echocardiogram. Recheck in 3 months for office visit and EKG on May 7

## 2013-04-09 NOTE — Patient Instructions (Signed)
Your physician recommends that you schedule a follow-up appointment in: [place] physician recommends that you HAVE LAB [place]  Your physician has requested that you have an echocardiogram. Echocardiography is a painless test that uses sound waves to create images of your heart. It provides your doctor with information about the size and shape of your heart and how well your heart's chambers and valves are working. This procedure takes approximately one hour. There are no restrictions for this procedure.   CHANGE TO DILTIAZEM CD 180 MG ONCE DAILY  USE THE DILTIAZEM 60 MG EVERY EIGHT HOURS AS NEEDED FOR BREAK THROUGH ATRIAL FIB

## 2013-04-09 NOTE — Assessment & Plan Note (Signed)
The patient was in atrial fibrillation when she went home over the weekend.  The day after she was discharged she felt her heart going back to normal rhythm and she has remained in normal sinus rhythm clinically since then.  She has been taking diltiazem 60 mg every 8 hours.  We will now switch her to diltiazem CD 180 mg once a day.  She may still use the short acting 60 mg tablets if necessary for breakthrough atrial fib.  Her last echocardiogram was in April 2014.  We will update her echo.

## 2013-04-09 NOTE — Assessment & Plan Note (Signed)
The patient has not had any exacerbation of her chronic diastolic heart failure symptoms. Her potassium on admission to the hospital was borderline low at 3.5 which may have played a role in her atrial fibrillation.  She was given supplement in the hospital but not on discharge.  We will recheck her potassium today.  She may need to be on outpatient potassium.

## 2013-04-09 NOTE — Assessment & Plan Note (Signed)
The patient will remain on long-term Coumadin.  She is now on it for multiple indications including her atrial fibrillation and her history of recurrent pulmonary embolism and her vascular disease.  INR today is pending

## 2013-04-10 NOTE — Progress Notes (Signed)
Quick Note:  Please report to patient. The recent labs are stable. Continue same medication and careful diet. Potassium okay 4.3 now. ______

## 2013-04-12 ENCOUNTER — Telehealth: Payer: Self-pay | Admitting: Cardiology

## 2013-04-12 ENCOUNTER — Encounter: Payer: Self-pay | Admitting: Podiatry

## 2013-04-12 ENCOUNTER — Ambulatory Visit (INDEPENDENT_AMBULATORY_CARE_PROVIDER_SITE_OTHER): Payer: Medicare Other | Admitting: Podiatry

## 2013-04-12 VITALS — BP 158/80 | HR 106 | Resp 12

## 2013-04-12 DIAGNOSIS — L84 Corns and callosities: Secondary | ICD-10-CM | POA: Diagnosis not present

## 2013-04-12 DIAGNOSIS — H2510 Age-related nuclear cataract, unspecified eye: Secondary | ICD-10-CM | POA: Diagnosis not present

## 2013-04-12 DIAGNOSIS — H43819 Vitreous degeneration, unspecified eye: Secondary | ICD-10-CM | POA: Diagnosis not present

## 2013-04-12 DIAGNOSIS — H33059 Total retinal detachment, unspecified eye: Secondary | ICD-10-CM | POA: Diagnosis not present

## 2013-04-12 NOTE — Patient Instructions (Signed)
Wear a surgical shoe on left foot. Apply Vaseline and a foam pad around the bleeding callus on the bottom the left great toe area daily until the bleeding callus is resolved.

## 2013-04-12 NOTE — Telephone Encounter (Signed)
Left message to call back  

## 2013-04-12 NOTE — Telephone Encounter (Signed)
Message copied by Earvin Hansen on Mon Apr 12, 2013  3:09 PM ------      Message from: Darlin Coco      Created: Sat Apr 10, 2013  9:31 PM       Please report to patient.  The recent labs are stable. Continue same medication and careful diet. Potassium okay 4.3 now. ------

## 2013-04-12 NOTE — Telephone Encounter (Signed)
Patient wanted to let  Dr. Mare Ferrari know that she has had two episodes of Afib with heart rate in 130's. Took her diltiazem 60 mg and converted after about 2 hours. Both times it happened between 5-6 pm. Also with this new medication her blood pressure has been running 140-180/70-74. Offered to discuss with covering MD or NP but she preferred I discuss with  Dr. Mare Ferrari when he is back Friday. Will forward for review.   Advised patient of lab results

## 2013-04-12 NOTE — Telephone Encounter (Signed)
Increase Toprol to a whole 25 mg daily at HS

## 2013-04-12 NOTE — Telephone Encounter (Signed)
New message  ° ° °Patient calling for test results.   °

## 2013-04-13 ENCOUNTER — Ambulatory Visit (HOSPITAL_COMMUNITY): Payer: Medicare Other | Attending: Cardiology | Admitting: Cardiology

## 2013-04-13 ENCOUNTER — Encounter: Payer: Self-pay | Admitting: Cardiology

## 2013-04-13 DIAGNOSIS — I4891 Unspecified atrial fibrillation: Secondary | ICD-10-CM | POA: Diagnosis not present

## 2013-04-13 DIAGNOSIS — I5033 Acute on chronic diastolic (congestive) heart failure: Secondary | ICD-10-CM

## 2013-04-13 DIAGNOSIS — I5032 Chronic diastolic (congestive) heart failure: Secondary | ICD-10-CM

## 2013-04-13 NOTE — Telephone Encounter (Signed)
Follow up    Patient returning call back to nurse

## 2013-04-13 NOTE — Telephone Encounter (Signed)
Follow up    Returned Melinda's call

## 2013-04-13 NOTE — Telephone Encounter (Signed)
Left message to call back  

## 2013-04-13 NOTE — Progress Notes (Signed)
Patient ID: [name], female   DOB: 04/24/1934, 79 y.o.   MRN: [id number]  Subjective: This patient presents with her husband and requesting evaluation on the plantar left first MPJ area. She is a history of ulceration in the area. She was last seen for the similar problem and discharged at that time on 03/09/2013  Objective: Hemorrhagic callus plantar left first MPJ noted  Assessment: Hyperkeratotic tissue that is pre-ulcerative  Plan: The keratoses was debrided. Patient advised to apply Vaseline and gauze dressing and wear her existing surgical shoe on the left foot until the hemorrhagic callus is resolved.  Reappoint here at patient's request.

## 2013-04-13 NOTE — Progress Notes (Signed)
Echo performed. 

## 2013-04-13 NOTE — Telephone Encounter (Signed)
Advised patient

## 2013-04-14 ENCOUNTER — Telehealth: Payer: Self-pay | Admitting: Cardiology

## 2013-04-14 DIAGNOSIS — I4891 Unspecified atrial fibrillation: Secondary | ICD-10-CM

## 2013-04-14 NOTE — Telephone Encounter (Signed)
Patient is having to take her Diltiazem 60 mg everyday for her heart rate. Is concerned that her blood pressure is going up into the 827'M systolic at times. Wants to make sure she doesn't need to make any medication adjustments or be seen sooner than May.   Advised patient oc echo results.

## 2013-04-14 NOTE — Telephone Encounter (Signed)
Message copied by Earvin Hansen on Wed Apr 14, 2013 12:04 PM ------      Message from: Darlin Coco      Created: Wed Apr 14, 2013 12:10 AM       Please report.  The echo was satisfactory.  LA was mildly enlarged. Valves working okay. ------

## 2013-04-14 NOTE — Telephone Encounter (Signed)
Will forward to  Dr. Mare Ferrari for review

## 2013-04-14 NOTE — Telephone Encounter (Signed)
New message     Returned a nurses call

## 2013-04-15 NOTE — Telephone Encounter (Signed)
I would suggest increase the diltiazem to 180 CD BID.  This should help the BP as well as the paroxysmal AF.

## 2013-04-16 NOTE — Addendum Note (Signed)
Addended by: Alvina Filbert B on: 04/16/2013 05:02 PM   Modules accepted: Orders

## 2013-04-16 NOTE — Telephone Encounter (Signed)
Left message to call back  

## 2013-04-16 NOTE — Telephone Encounter (Signed)
Advised patient

## 2013-04-19 ENCOUNTER — Telehealth: Payer: Self-pay | Admitting: *Deleted

## 2013-04-19 DIAGNOSIS — E876 Hypokalemia: Secondary | ICD-10-CM | POA: Diagnosis not present

## 2013-04-19 DIAGNOSIS — R5381 Other malaise: Secondary | ICD-10-CM | POA: Diagnosis not present

## 2013-04-19 DIAGNOSIS — I4891 Unspecified atrial fibrillation: Secondary | ICD-10-CM | POA: Diagnosis not present

## 2013-04-19 NOTE — Telephone Encounter (Signed)
Patient called this am and since changing/increasing Diltiazem CD blood pressure been going up (180/82 HR 112 this am). She states that she has had increased fatigue, headache, dry mouth, and just not feeling well. discussed with  Dr. Mare Ferrari and will have patient d/c Diltiazem CD and resume Diltiazem 60 mg twice a day and increase Metoprolol Succ. 25 mg to twice a day. Advised patient, verbalized understanding. Patient will call back with update.

## 2013-04-20 ENCOUNTER — Telehealth: Payer: Self-pay | Admitting: Cardiology

## 2013-04-20 NOTE — Telephone Encounter (Signed)
New message  Patient called in to speak with Rip Harbour, due to a new medication. And she was real SOB. I sent call to Triage and Adelfa Koh spoke with patient.

## 2013-04-20 NOTE — Telephone Encounter (Signed)
Pt spoke with Rip Harbour  yesterday and the  Diltiazem CD was D/C and she resumed the  Diltiazem 60 mg twice a day and Metoprolol Succinate was increased from 12.5 mg once a day to 25 mg twice a day. Pt 's BP then was running 180/82 HR 112 yesterday AM. Today pt is SOB and feel unsteady on her feet, and she has no energy. Pt thinks that the increased Metoprolol dose is causing these symptoms, because she tolerates the Diltiazem 60 mg well before. Pt's BP today has been in the AM 168/90 saturation 96 % at 8:45 AM 148/86 HR 90 sat 95 %, at 10:45 BP 144/65 HR 82 sat 92 % at 3)) Pm 164/85 HR 102 sat 92 %.

## 2013-04-21 NOTE — Telephone Encounter (Signed)
Follow up    Patient calling back in response to message that sent on yesterday. Aware that [name] was not in the office on yesterday.

## 2013-04-21 NOTE — Telephone Encounter (Signed)
Decrease metoprolol back down to 12.5 mg twice a day and increase diltiazem 60 mg to 3 times a day

## 2013-04-21 NOTE — Telephone Encounter (Signed)
Advised patient, verbalized understanding.  Patient will call back Friday with update

## 2013-04-23 ENCOUNTER — Telehealth: Payer: Self-pay | Admitting: *Deleted

## 2013-04-23 NOTE — Telephone Encounter (Signed)
Patient phoned c/o still having some shortness of breath and legs feel heavy, concerned coming from Metoprolol. Discussed with  Dr. Mare Ferrari and will have patient decrease her Metoprolol to just once a day and ok to take Diltiazem 60 mg three to four times a day. If continues, follow up with PCP. Advised patient and she will call back next week with update.

## 2013-04-28 ENCOUNTER — Telehealth: Payer: Self-pay | Admitting: Cardiology

## 2013-04-28 NOTE — Telephone Encounter (Signed)
Agree with advice given regarding contacting PCP with these symptoms

## 2013-04-28 NOTE — Telephone Encounter (Signed)
Spoke with patient and she will continue current medications and contact her PCP regarding her legs "feeling like they are in cement"

## 2013-04-28 NOTE — Telephone Encounter (Signed)
New message  Follow up:  Since Friday her BP is running 130/62 pulse 86 OT stats 95-97 She is still having headaches and SOB The thing that bothers her the most is the pain in her legs. She feels that she is beyond going. Is this something that will go away? Or something she will have to deal with the rest of her life?  Please call and advise.

## 2013-04-30 ENCOUNTER — Ambulatory Visit (INDEPENDENT_AMBULATORY_CARE_PROVIDER_SITE_OTHER): Payer: Medicare Other | Admitting: Pharmacist

## 2013-04-30 DIAGNOSIS — I2699 Other pulmonary embolism without acute cor pulmonale: Secondary | ICD-10-CM

## 2013-04-30 LAB — POCT INR: INR: 2.2

## 2013-05-03 DIAGNOSIS — N3020 Other chronic cystitis without hematuria: Secondary | ICD-10-CM | POA: Diagnosis not present

## 2013-05-03 DIAGNOSIS — I10 Essential (primary) hypertension: Secondary | ICD-10-CM | POA: Diagnosis not present

## 2013-05-03 DIAGNOSIS — R5381 Other malaise: Secondary | ICD-10-CM | POA: Diagnosis not present

## 2013-05-03 DIAGNOSIS — R5383 Other fatigue: Secondary | ICD-10-CM | POA: Diagnosis not present

## 2013-05-05 ENCOUNTER — Other Ambulatory Visit: Payer: Self-pay | Admitting: *Deleted

## 2013-05-05 MED ORDER — DILTIAZEM HCL 60 MG PO TABS
60.0000 mg | ORAL_TABLET | Freq: Three times a day (TID) | ORAL | Status: DC
Start: 2013-05-05 — End: 2013-06-17

## 2013-05-11 DIAGNOSIS — M1990 Unspecified osteoarthritis, unspecified site: Secondary | ICD-10-CM | POA: Diagnosis not present

## 2013-05-11 DIAGNOSIS — K219 Gastro-esophageal reflux disease without esophagitis: Secondary | ICD-10-CM | POA: Diagnosis not present

## 2013-05-11 DIAGNOSIS — IMO0001 Reserved for inherently not codable concepts without codable children: Secondary | ICD-10-CM | POA: Diagnosis not present

## 2013-05-11 DIAGNOSIS — E785 Hyperlipidemia, unspecified: Secondary | ICD-10-CM | POA: Diagnosis not present

## 2013-05-17 DIAGNOSIS — I10 Essential (primary) hypertension: Secondary | ICD-10-CM | POA: Diagnosis not present

## 2013-05-17 DIAGNOSIS — R5381 Other malaise: Secondary | ICD-10-CM | POA: Diagnosis not present

## 2013-05-17 DIAGNOSIS — R5383 Other fatigue: Secondary | ICD-10-CM | POA: Diagnosis not present

## 2013-05-18 DIAGNOSIS — M25569 Pain in unspecified knee: Secondary | ICD-10-CM | POA: Diagnosis not present

## 2013-05-18 DIAGNOSIS — M25559 Pain in unspecified hip: Secondary | ICD-10-CM | POA: Diagnosis not present

## 2013-05-21 DIAGNOSIS — Z961 Presence of intraocular lens: Secondary | ICD-10-CM | POA: Diagnosis not present

## 2013-05-21 DIAGNOSIS — H40059 Ocular hypertension, unspecified eye: Secondary | ICD-10-CM | POA: Diagnosis not present

## 2013-05-21 DIAGNOSIS — H2510 Age-related nuclear cataract, unspecified eye: Secondary | ICD-10-CM | POA: Diagnosis not present

## 2013-05-21 DIAGNOSIS — H33059 Total retinal detachment, unspecified eye: Secondary | ICD-10-CM | POA: Diagnosis not present

## 2013-05-21 DIAGNOSIS — H4011X0 Primary open-angle glaucoma, stage unspecified: Secondary | ICD-10-CM | POA: Diagnosis not present

## 2013-05-28 ENCOUNTER — Ambulatory Visit (INDEPENDENT_AMBULATORY_CARE_PROVIDER_SITE_OTHER): Payer: Medicare Other | Admitting: *Deleted

## 2013-05-28 DIAGNOSIS — I2699 Other pulmonary embolism without acute cor pulmonale: Secondary | ICD-10-CM | POA: Diagnosis not present

## 2013-05-28 LAB — POCT INR: INR: 2.3

## 2013-06-17 ENCOUNTER — Encounter: Payer: Self-pay | Admitting: Cardiology

## 2013-06-17 ENCOUNTER — Ambulatory Visit (INDEPENDENT_AMBULATORY_CARE_PROVIDER_SITE_OTHER): Payer: Medicare Other | Admitting: *Deleted

## 2013-06-17 ENCOUNTER — Ambulatory Visit (INDEPENDENT_AMBULATORY_CARE_PROVIDER_SITE_OTHER): Payer: Medicare Other | Admitting: Cardiology

## 2013-06-17 VITALS — BP 136/60 | HR 100 | Ht 62.0 in | Wt 243.0 lb

## 2013-06-17 DIAGNOSIS — I10 Essential (primary) hypertension: Secondary | ICD-10-CM | POA: Insufficient documentation

## 2013-06-17 DIAGNOSIS — I6529 Occlusion and stenosis of unspecified carotid artery: Secondary | ICD-10-CM

## 2013-06-17 DIAGNOSIS — I509 Heart failure, unspecified: Secondary | ICD-10-CM

## 2013-06-17 DIAGNOSIS — I2699 Other pulmonary embolism without acute cor pulmonale: Secondary | ICD-10-CM | POA: Diagnosis not present

## 2013-06-17 DIAGNOSIS — I4891 Unspecified atrial fibrillation: Secondary | ICD-10-CM | POA: Diagnosis not present

## 2013-06-17 DIAGNOSIS — I119 Hypertensive heart disease without heart failure: Secondary | ICD-10-CM | POA: Diagnosis not present

## 2013-06-17 DIAGNOSIS — I480 Paroxysmal atrial fibrillation: Secondary | ICD-10-CM

## 2013-06-17 DIAGNOSIS — I5032 Chronic diastolic (congestive) heart failure: Secondary | ICD-10-CM | POA: Diagnosis not present

## 2013-06-17 DIAGNOSIS — I5033 Acute on chronic diastolic (congestive) heart failure: Secondary | ICD-10-CM

## 2013-06-17 LAB — POCT INR: INR: 2.9

## 2013-06-17 NOTE — Assessment & Plan Note (Signed)
The patient continues to have exertional dyspnea.  She is not having any wheezing at this time however.  She has only trace edema

## 2013-06-17 NOTE — Assessment & Plan Note (Signed)
Today the patient is in atrial fibrillation.  This was confirmed by EKG.  The patient herself did not realize she was in atrial fibrillation.  He was unaware of her irregular pulse.  Her pulse is rapid at 125.  We'll increase her Bystolic dose to 5 mg daily c

## 2013-06-17 NOTE — Patient Instructions (Signed)
INCREASE BYSTOLIC TO 5 MG DAILY  RESTART AMLODIPINE 5 MG DAILY  USE DILTIAZEM 60 MG EVERY 6 HOURS AS NEEDED FOR SYMPTOMATIC AFIB  Your physician recommends that you schedule a follow-up appointment in: [place]

## 2013-06-17 NOTE — Progress Notes (Signed)
[name] Date of Birth:  1935-01-04 [facility] [address] [place], [place]  [address] [phone]        Fax   [phone]   History of Present Illness: This pleasant 79 year old woman is seen for followup office visit.  She was admitted to the hospital in February 2015 because of new onset atrial fibrillation. She was already on long-term Coumadin. Diltiazem for rate control was added to her regimen in the hospital. The patient has a past history of pulmonary emboli and is on long-term Coumadin. Her INR has been stable. She has a history of essential hypertension and a history of right carotid artery occlusion. She has a past history of pulmonary emboli in January 2011 after right hip surgery and at that time venous Dopplers of the legs were negative for a source of embolization and she has remained on long-term Coumadin. 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 Donnetta Hutching. She does not have any significant coronary disease and she had cardiac catheterization in 1994 showing only mild plaque and she had a nuclear stress test in December 2011 showing no ischemia and her ejection fraction was 78%. Earlier this year she had left total shoulder replacement surgery by Dr. Onnie Graham. In October she saw pulmonary because of exertional dyspnea. She had a chest x-ray on 11/24/12 showing cardiomegaly. She had a VQ scan which was negative for new pulmonary emboli. The patient had an echocardiogram on 05/25/12 which showed an ejection fraction of 55-60% and no significant valve abnormalities. 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 This occurred over the Thanksgiving holiday. Her surgery had to be delayed a week in order to allow her supratherapeutic INR to correct itself. Her vision is still not back to baseline in the left eye. She anticipates  that she will need to have cataract surgery later this spring. Since we last saw her she has had ongoing problems with fluctuating blood pressure elevation as well as severe weakness and lack of energy and feeling that her legs were encased in cement when she tried to walk.  She had a buccal smear done at her PCP office for DNA.  She states that the DNA results showed that she did not metabolize metoprolol well.  Her metoprolol was stopped and she was switched to nebivolol and she has felt much better.   Current Outpatient Prescriptions  Medication Sig Dispense Refill  . acetaminophen (TYLENOL) 500 MG tablet Take 500 mg by mouth daily as needed for headache.      . allopurinol (ZYLOPRIM) 300 MG tablet Take 300 mg by mouth Daily.       [name] Kitchen amLODipine (NORVASC) 5 MG tablet Take 5 mg by mouth daily.      . calcium citrate-vitamin D (CITRACAL+D) 315-200 MG-UNIT per tablet Take 1 tablet by mouth daily.      . CELEBREX 200 MG capsule Take 200 mg by mouth Daily.      . cetirizine (ZYRTEC) 10 MG tablet Take 10 mg by mouth daily.      . cholecalciferol (VITAMIN D) 1000 UNITS tablet Take 3,000 Units by mouth daily.      [name] Kitchen diltiazem (CARDIZEM) 60 MG tablet Take 60 mg by mouth every 6 (six) hours as needed. For symptomatic afib      . diphenhydrAMINE (  BENADRYL) 25 mg capsule Take 25 mg by mouth at bedtime as needed for sleep. For sleep      . fenofibrate 160 MG tablet Take 160 mg by mouth every evening.       . furosemide (LASIX) 40 MG tablet Take 20 mg by mouth daily as needed (for fluid retention).      [name] Kitchen HYDROcodone-acetaminophen (NORCO/VICODIN) 5-325 MG per tablet Take 1 tablet by mouth every 6 (six) hours.       . indapamide (LOZOL) 1.25 MG tablet Take 1.25 mg by mouth every morning.      [name] Kitchen ketoconazole (NIZORAL) 2 % cream Apply 1 application topically 2 (two) times daily as needed for irritation.       [name] Kitchen latanoprost (XALATAN) 0.005 % ophthalmic solution Place 1 drop into both eyes at bedtime.      .  Magnesium 400 MG CAPS Take 400 mg by mouth daily.       . Multiple Vitamins-Minerals (MULTIVITAMIN WITH MINERALS) tablet Take 1 tablet by mouth daily.      . nebivolol (BYSTOLIC) 5 MG tablet Take 5 mg by mouth daily.       [name] Kitchen omeprazole (PRILOSEC) 40 MG capsule Take 40 mg by mouth daily with supper.       . warfarin (COUMADIN) 2.5 MG tablet Take 2.5 mg by mouth daily with supper.      . [DISCONTINUED] amitriptyline (ELAVIL) 10 MG tablet Take 10 mg by mouth at bedtime.        No current facility-administered medications for this visit.    Allergies  Allergen Reactions  . Ciprofloxacin     RASH  . Codeine     NAUSEA  . Cymbalta [Duloxetine Hcl]     Nausea VOMITING AND ABDOMINAL PAIN, HEADACHE, JUST ABOUT EVERY SIDE EFFECT THE DRUG HAS  . Metoprolol     Legs like cement  . Nortriptyline     Nightmares  . Penicillins     RASH  & ITCHING   --PT STATES SHE CAN TAKE KEFLEX PO AND IV CEPHALOSPORINS  . Statins     Leg cramps  . Tetanus Toxoids     SWELLING, REDNESS  WHOLE ARM  . Lyrica [Pregabalin] Diarrhea, Nausea And Vomiting and Rash    Patient Active Problem List   Diagnosis Date Noted  . Atrial fibrillation with RVR 04/01/2013  . Morbid obesity 04/01/2013  . OSA on CPAP 04/01/2013  . Chronic diastolic CHF (congestive heart failure)   . H/o recurrent pulmonary embolism, on Coumadin 05/24/2012  . Acute on chronic diastolic congestive heart failure 05/24/2012  . Normocytic anemia 05/24/2012  . Thrombocytopenia 05/24/2012  . Inadequate anticoagulation 05/24/2012  . Elevated brain natriuretic peptide (BNP) level 05/24/2012  . Shoulder arthritis 05/15/2012  . Occlusion and stenosis of carotid artery without mention of cerebral infarction 09/10/2011  . OA (osteoarthritis) of hip 06/17/2011  . Osteoarthritis 01/08/2011  . Fibromyalgia 08/06/2010  . Dyslipidemia 08/06/2010  . Gout 08/06/2010  . Right carotid artery occlusion 08/06/2010    History  Smoking status  . Former  Smoker -- 0.50 packs/day for 15 years  . Types: Cigarettes  . Quit date: 02/11/1986  Smokeless tobacco  . Never Used    History  Alcohol Use  . Yes    Comment: 05/14/2012 "have 2-3 drinks/yr" maybe    Family History  Problem Relation Age of Onset  . Heart disease Father     Heart Disease before age 61  . Heart attack Father 63  .  Hypertension Father   . Peripheral vascular disease Father     Right  leg amputation  . Heart disease Mother     AAA  . Stroke Mother   . Aneurysm Mother   . Hypertension Mother   . Hypertension Brother   . Heart disease Brother     Heart Disease before age 61  . Hyperlipidemia Brother   . Hypertension Brother   . Diabetes Paternal Grandfather   . Diabetes Son     Review of Systems: Constitutional: no fever chills diaphoresis or fatigue or change in weight.  Head and neck: no hearing loss, no epistaxis, no photophobia or visual disturbance. Respiratory: No cough, shortness of breath or wheezing. Cardiovascular: No chest pain peripheral edema, palpitations. Gastrointestinal: No abdominal distention, no abdominal pain, no change in bowel habits hematochezia or melena. Genitourinary: No dysuria, no frequency, no urgency, no nocturia. Musculoskeletal:No arthralgias, no back pain, no gait disturbance or myalgias. Neurological: No dizziness, no headaches, no numbness, no seizures, no syncope, no weakness, no tremors. Hematologic: No lymphadenopathy, no easy bruising. Psychiatric: No confusion, no hallucinations, no sleep disturbance.    Physical Exam: Filed Vitals:   06/17/13 1328  BP: 136/60  Pulse: 100   the general appearance reveals an overweight middle-aged woman in no acute distress.The head and neck exam reveals pupils equal and reactive.  Extraocular movements are full.  There is no scleral icterus.  The mouth and pharynx are normal.  The neck is supple.  The carotids reveal no bruits.  The jugular venous pressure is normal.  The   thyroid is not enlarged.  There is no lymphadenopathy.  The chest is clear to percussion and auscultation.  There are no rales or rhonchi.  Expansion of the chest is symmetrical.  The pulse is irregularly irregular. The precordium is quiet.  The first heart sound is normal.  The second heart sound is physiologically split.  There is no murmur gallop rub or click.  There is no abnormal lift or heave.  The abdomen is soft and nontender.  The bowel sounds are normal.  The liver and spleen are not enlarged.  There are no abdominal masses.  There are no abdominal bruits.  Extremities reveal good pedal pulses.  There is no phlebitis and there is trace ankle edema.  There is no cyanosis or clubbing.  Strength is normal and symmetrical in all extremities.  There is no lateralizing weakness.  There are no sensory deficits.  The skin is warm and dry.  There is no rash.   EKG shows atrial fibrillation with rapid ventricular response  Assessment / Plan: 1. paroxysmal atrial fibrillation on long-term anticoagulation 2. prior pulmonary emboli 3. essential hypertension with intolerance to metoprolol. 4. Chronic diastolic CHF  Plan: Increased nebivolol to 5 mg daily.  Restart  amlodipine for blood pressure control dose of 5 mg daily. She will utilize diltiazem only for symptomatic tachycardia. Recheck in 3 months for followup office visit and EKG

## 2013-06-17 NOTE — Assessment & Plan Note (Signed)
The patient has been taking diltiazem 3 times a day.  She feels that she does not tolerate diltiazem on a regular basis and it also makes her feel weak and bad.  Previously she had been taking amlodipine and tolerating it well.  We will have her stop her scheduled diltiazem and switched to amlodipine 5 mg daily.  She will only use diltiazem for control of symptomatic tachycardia atrial fibrillation

## 2013-06-21 ENCOUNTER — Encounter: Payer: Self-pay | Admitting: Podiatry

## 2013-06-21 ENCOUNTER — Ambulatory Visit (INDEPENDENT_AMBULATORY_CARE_PROVIDER_SITE_OTHER): Payer: Medicare Other | Admitting: Podiatry

## 2013-06-21 VITALS — BP 130/90 | HR 130 | Resp 18

## 2013-06-21 DIAGNOSIS — L97509 Non-pressure chronic ulcer of other part of unspecified foot with unspecified severity: Secondary | ICD-10-CM | POA: Diagnosis not present

## 2013-06-21 NOTE — Progress Notes (Signed)
Patient ID: [name], female   DOB: Oct 28, 1934, 79 y.o.   MRN: [id number]  Subjective: This 79 year old white female orientated x3 space presents with her husband. Approximately 10 days ago a previous  ulcerated site on the left foot has recurred. She is currently applying Silvadene to the area and wrapping with gauze.  Objective: Hemorrhagic hyperkeratoses with 3 mm superficial ulcer on the plantar left first MPJ. There is no erythema, edema, warmth noted.  Assessment: Recurrence of traumatic skin ulceration plantar left first MPJ, without clinical sign of infection.  Plan: The ulcer and hyperkeratotic area are debrided and dressed with Silvadene cream. Patient will continue daily wound care applying Silvadene to the ulcer site. Resume wearing surgical shoe on left foot  Patient has existing orthotic and requested patient to bring the existing orthotics for evaluation on the next visit in 7 days

## 2013-06-21 NOTE — Patient Instructions (Signed)
Apply Silvadene cream daily to the skin ulcer on the left foot and wrapped with gauze. Wear a surgical shoe on the left foot  Please bring his existing orthotic on your next visit.

## 2013-06-25 DIAGNOSIS — H40059 Ocular hypertension, unspecified eye: Secondary | ICD-10-CM | POA: Diagnosis not present

## 2013-06-25 DIAGNOSIS — Z961 Presence of intraocular lens: Secondary | ICD-10-CM | POA: Diagnosis not present

## 2013-06-25 DIAGNOSIS — H4011X0 Primary open-angle glaucoma, stage unspecified: Secondary | ICD-10-CM | POA: Diagnosis not present

## 2013-06-25 DIAGNOSIS — H2589 Other age-related cataract: Secondary | ICD-10-CM | POA: Diagnosis not present

## 2013-06-28 ENCOUNTER — Ambulatory Visit: Payer: Medicare Other | Admitting: Podiatry

## 2013-07-01 DIAGNOSIS — R0989 Other specified symptoms and signs involving the circulatory and respiratory systems: Secondary | ICD-10-CM | POA: Diagnosis not present

## 2013-07-01 DIAGNOSIS — N390 Urinary tract infection, site not specified: Secondary | ICD-10-CM | POA: Diagnosis not present

## 2013-07-01 DIAGNOSIS — I4891 Unspecified atrial fibrillation: Secondary | ICD-10-CM | POA: Diagnosis not present

## 2013-07-01 DIAGNOSIS — R05 Cough: Secondary | ICD-10-CM | POA: Diagnosis not present

## 2013-07-01 DIAGNOSIS — R252 Cramp and spasm: Secondary | ICD-10-CM | POA: Diagnosis not present

## 2013-07-01 DIAGNOSIS — R059 Cough, unspecified: Secondary | ICD-10-CM | POA: Diagnosis not present

## 2013-07-01 DIAGNOSIS — R3989 Other symptoms and signs involving the genitourinary system: Secondary | ICD-10-CM | POA: Diagnosis not present

## 2013-07-01 DIAGNOSIS — R0609 Other forms of dyspnea: Secondary | ICD-10-CM | POA: Diagnosis not present

## 2013-07-02 ENCOUNTER — Encounter: Payer: Self-pay | Admitting: Podiatry

## 2013-07-02 ENCOUNTER — Ambulatory Visit (INDEPENDENT_AMBULATORY_CARE_PROVIDER_SITE_OTHER): Payer: Medicare Other | Admitting: Podiatry

## 2013-07-02 VITALS — BP 144/75 | HR 86 | Resp 18

## 2013-07-02 DIAGNOSIS — L97509 Non-pressure chronic ulcer of other part of unspecified foot with unspecified severity: Secondary | ICD-10-CM | POA: Diagnosis not present

## 2013-07-02 NOTE — Patient Instructions (Signed)
Wear a surgical shoe and left foot x7 days Wear the purple modified shoe insole in a standard shoe after 7 days  Apply Vaseline to the pre-ulcerative area on the ball of left foot when wearing the surgical shoe

## 2013-07-03 NOTE — Progress Notes (Signed)
Patient ID: [name], female   DOB: May 27, 1934, 79 y.o.   MRN: [id number]  Subjective: Orientated x3 white female presents with her husband for ongoing evaluation and treatment for traumatic skin ulcer on the plantar aspect the left foot.  Objective: Hemorrhagic hyperkeratotic lesion that remains closed after debridement on the plantar aspect of the left first MPJ There is no surrounding erythema, edema or warmth around the plantar left first MPJ Patient has accommodative orthotic made by [facility] orthopedic and has a pocket accommodation for the left first MPJ, purple in color  Assessment: Pre-ulcerative hyperkeratotic lesion plantar first MPJ  Plan: The pre-ulcerative hyperkeratotic lesion was debrided and as fragile closure.  Patient is advised to wear the existing modified surgical shoe with a Plastizote insole additional 7 days Apply Vaseline to the plantar first MPJ and wear a cotton sock.  After 7 days patient will place the custom orthotic in her athletic style shoes to offload the left first MPJ.  Reappoint x one month or sooner if patient has concern

## 2013-07-07 DIAGNOSIS — R05 Cough: Secondary | ICD-10-CM | POA: Diagnosis not present

## 2013-07-07 DIAGNOSIS — R0989 Other specified symptoms and signs involving the circulatory and respiratory systems: Secondary | ICD-10-CM | POA: Diagnosis not present

## 2013-07-07 DIAGNOSIS — I509 Heart failure, unspecified: Secondary | ICD-10-CM | POA: Diagnosis not present

## 2013-07-07 DIAGNOSIS — D649 Anemia, unspecified: Secondary | ICD-10-CM | POA: Diagnosis not present

## 2013-07-07 DIAGNOSIS — R059 Cough, unspecified: Secondary | ICD-10-CM | POA: Diagnosis not present

## 2013-07-12 DIAGNOSIS — I10 Essential (primary) hypertension: Secondary | ICD-10-CM | POA: Diagnosis not present

## 2013-07-12 DIAGNOSIS — I4891 Unspecified atrial fibrillation: Secondary | ICD-10-CM | POA: Diagnosis not present

## 2013-07-12 DIAGNOSIS — E876 Hypokalemia: Secondary | ICD-10-CM | POA: Diagnosis not present

## 2013-07-12 DIAGNOSIS — G4733 Obstructive sleep apnea (adult) (pediatric): Secondary | ICD-10-CM | POA: Diagnosis not present

## 2013-07-12 DIAGNOSIS — D509 Iron deficiency anemia, unspecified: Secondary | ICD-10-CM | POA: Diagnosis not present

## 2013-07-15 DIAGNOSIS — Z1212 Encounter for screening for malignant neoplasm of rectum: Secondary | ICD-10-CM | POA: Diagnosis not present

## 2013-07-15 DIAGNOSIS — D509 Iron deficiency anemia, unspecified: Secondary | ICD-10-CM | POA: Diagnosis not present

## 2013-07-19 DIAGNOSIS — D509 Iron deficiency anemia, unspecified: Secondary | ICD-10-CM | POA: Diagnosis not present

## 2013-07-19 DIAGNOSIS — I10 Essential (primary) hypertension: Secondary | ICD-10-CM | POA: Diagnosis not present

## 2013-07-19 DIAGNOSIS — I509 Heart failure, unspecified: Secondary | ICD-10-CM | POA: Diagnosis not present

## 2013-07-21 ENCOUNTER — Ambulatory Visit: Payer: Medicare Other | Admitting: Podiatry

## 2013-07-27 ENCOUNTER — Encounter: Payer: Self-pay | Admitting: Cardiology

## 2013-07-27 ENCOUNTER — Ambulatory Visit (INDEPENDENT_AMBULATORY_CARE_PROVIDER_SITE_OTHER): Payer: Medicare Other | Admitting: Pharmacist

## 2013-07-27 ENCOUNTER — Ambulatory Visit (INDEPENDENT_AMBULATORY_CARE_PROVIDER_SITE_OTHER): Payer: Medicare Other | Admitting: Cardiology

## 2013-07-27 VITALS — BP 126/72 | HR 111 | Ht 62.0 in | Wt 234.0 lb

## 2013-07-27 DIAGNOSIS — I119 Hypertensive heart disease without heart failure: Secondary | ICD-10-CM | POA: Diagnosis not present

## 2013-07-27 DIAGNOSIS — I5032 Chronic diastolic (congestive) heart failure: Secondary | ICD-10-CM | POA: Diagnosis not present

## 2013-07-27 DIAGNOSIS — R0989 Other specified symptoms and signs involving the circulatory and respiratory systems: Secondary | ICD-10-CM

## 2013-07-27 DIAGNOSIS — I4891 Unspecified atrial fibrillation: Secondary | ICD-10-CM

## 2013-07-27 DIAGNOSIS — I2699 Other pulmonary embolism without acute cor pulmonale: Secondary | ICD-10-CM

## 2013-07-27 DIAGNOSIS — I6529 Occlusion and stenosis of unspecified carotid artery: Secondary | ICD-10-CM | POA: Diagnosis not present

## 2013-07-27 DIAGNOSIS — I509 Heart failure, unspecified: Secondary | ICD-10-CM

## 2013-07-27 DIAGNOSIS — R0609 Other forms of dyspnea: Secondary | ICD-10-CM | POA: Diagnosis not present

## 2013-07-27 DIAGNOSIS — I5033 Acute on chronic diastolic (congestive) heart failure: Secondary | ICD-10-CM

## 2013-07-27 LAB — POCT INR: INR: 2.1

## 2013-07-27 MED ORDER — NEBIVOLOL HCL 5 MG PO TABS: 7.5000 mg | ORAL_TABLET | Freq: Every day | ORAL | Status: DC

## 2013-07-27 NOTE — Patient Instructions (Signed)
Your physician has recommended you make the following change in your medication: Increase Bystolic ( 9.7KQ) daily   Your physician recommends that you keep your scheduled  appointment August 11 @ 2:30

## 2013-07-27 NOTE — Assessment & Plan Note (Signed)
She feels better on Bystolic.  Her heart rate is still rapid and we will increase her dose from 5 mg a day up to 7.5 mg a day.  Her blood pressure is lower now so we may have to cut back on some of her other antihypertensives if her blood pressure drops further in order to be able to control her ventricular response.  She does have cardiomegaly by chest x-ray and we want to avoid tachycardia mediated cardiomyopathy

## 2013-07-27 NOTE — Assessment & Plan Note (Signed)
She has not been having any pleuritic chest discomfort.  No evidence of any recurrent pulmonary emboli.  She remains on long-term Coumadin

## 2013-07-27 NOTE — Progress Notes (Signed)
[name] Date of Birth:  [birth year] [facility] [address] [place], [place]  [address] [phone]        Fax   [phone]   History of Present Illness: This pleasant 79 year old woman is seen for followup office visit.  She was admitted to the hospital in February 2015 because of new onset atrial fibrillation. She was already on long-term Coumadin. Diltiazem for rate control was added to her regimen in the hospital. The patient has a past history of pulmonary emboli and is on long-term Coumadin. Her INR has been stable. She has a history of essential hypertension and a history of right carotid artery occlusion. She has a past history of pulmonary emboli in January 2011 after right hip surgery and at that time venous Dopplers of the legs were negative for a source of embolization and she has remained on long-term Coumadin. 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 Donnetta Hutching. She does not have any significant coronary disease and she had cardiac catheterization in 1994 showing only mild plaque and she had a nuclear stress test in December 2011 showing no ischemia and her ejection fraction was 78%. Earlier this year she had left total shoulder replacement surgery by Dr. Onnie Graham. In October she saw pulmonary because of exertional dyspnea. She had a chest x-ray on 11/24/12 showing cardiomegaly. She had a VQ scan which was negative for new pulmonary emboli. The patient had an echocardiogram on 05/25/12 which showed an ejection fraction of 55-60% and no significant valve abnormalities. 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 This occurred over the Thanksgiving holiday. Her surgery had to be delayed a week in order to allow her supratherapeutic INR to correct itself. Her vision is still not back to baseline in the left eye. She anticipates  that she will need to have cataract surgery later this spring. Since we last saw her she has had ongoing problems with fluctuating blood pressure elevation as well as severe weakness and lack of energy and feeling that her legs were encased in cement when she tried to walk.  She had a buccal smear done at her PCP office for DNA.  She states that the DNA results showed that she did not metabolize metoprolol well.  Her metoprolol was stopped and she was switched to nebivolol and she has felt much better.  Since last visit she was switched to Bystolic 5 mg a day and her diltiazem was stopped.  She has felt better although her pulse remains rapid at times.   Current Outpatient Prescriptions  Medication Sig Dispense Refill  . acetaminophen (TYLENOL) 500 MG tablet Take 500 mg by mouth daily as needed for headache.      . allopurinol (ZYLOPRIM) 300 MG tablet Take 300 mg by mouth Daily.       [name] Kitchen amLODipine (NORVASC) 5 MG tablet Take 5 mg by mouth daily.      . calcium citrate-vitamin D (CITRACAL+D) 315-200 MG-UNIT per tablet Take 1 tablet by mouth daily.      . cefUROXime (CEFTIN) 250 MG tablet       . CELEBREX 200 MG capsule Take 200 mg by mouth Daily.      . cetirizine (ZYRTEC) 10 MG tablet Take 10 mg by mouth daily.      . cholecalciferol (VITAMIN  D) 1000 UNITS tablet Take 3,000 Units by mouth daily.      [name] Kitchen diltiazem (CARDIZEM) 60 MG tablet Take 60 mg by mouth every 6 (six) hours as needed. For symptomatic afib      . diphenhydrAMINE (BENADRYL) 25 mg capsule Take 25 mg by mouth at bedtime as needed for sleep. For sleep      . dorzolamide-timolol (COSOPT) 22.3-6.8 MG/ML ophthalmic solution       . fenofibrate 160 MG tablet Take 160 mg by mouth every evening.       . furosemide (LASIX) 40 MG tablet Take 20 mg by mouth daily as needed (for fluid retention).      [name] Kitchen gentamicin (GARAMYCIN) 0.3 % ophthalmic solution       . HYDROcodone-acetaminophen (NORCO/VICODIN) 5-325 MG per tablet Take 1 tablet by mouth  every 6 (six) hours.       . indapamide (LOZOL) 1.25 MG tablet Take 1.25 mg by mouth every morning.      [name] Kitchen ketoconazole (NIZORAL) 2 % cream Apply 1 application topically 2 (two) times daily as needed for irritation.       [name] Kitchen latanoprost (XALATAN) 0.005 % ophthalmic solution Place 1 drop into both eyes at bedtime.      . LOTEMAX 0.5 % GEL       . Magnesium 400 MG CAPS Take 400 mg by mouth daily.       . Multiple Vitamins-Minerals (MULTIVITAMIN WITH MINERALS) tablet Take 1 tablet by mouth daily.      . nebivolol (BYSTOLIC) 5 MG tablet Take 1.5 tablets (7.5 mg total) by mouth daily.  45 tablet  3  . omeprazole (PRILOSEC) 40 MG capsule Take 40 mg by mouth daily with supper.       . vitamin C (ASCORBIC ACID) 500 MG tablet Take 500 mg by mouth daily.      [name] Kitchen warfarin (COUMADIN) 2.5 MG tablet Take 2.5 mg by mouth daily with supper.      . [DISCONTINUED] amitriptyline (ELAVIL) 10 MG tablet Take 10 mg by mouth at bedtime.        No current facility-administered medications for this visit.    Allergies  Allergen Reactions  . Ciprofloxacin     RASH  . Codeine     NAUSEA  . Cymbalta [Duloxetine Hcl]     Nausea VOMITING AND ABDOMINAL PAIN, HEADACHE, JUST ABOUT EVERY SIDE EFFECT THE DRUG HAS  . Metoprolol     Legs like cement  . Nortriptyline     Nightmares  . Penicillins     RASH  & ITCHING   --PT STATES SHE CAN TAKE KEFLEX PO AND IV CEPHALOSPORINS  . Statins     Leg cramps  . Tetanus Toxoids     SWELLING, REDNESS  WHOLE ARM  . Lyrica [Pregabalin] Diarrhea, Nausea And Vomiting and Rash    Patient Active Problem List   Diagnosis Date Noted  . Essential hypertension 06/17/2013  . Atrial fibrillation with RVR 04/01/2013  . Morbid obesity 04/01/2013  . OSA on CPAP 04/01/2013  . Chronic diastolic CHF (congestive heart failure)   . H/o recurrent pulmonary embolism, on Coumadin 05/24/2012  . Acute on chronic diastolic congestive heart failure 05/24/2012  . Normocytic anemia 05/24/2012  .  Thrombocytopenia 05/24/2012  . Inadequate anticoagulation 05/24/2012  . Elevated brain natriuretic peptide (BNP) level 05/24/2012  . Shoulder arthritis 05/15/2012  . Occlusion and stenosis of carotid artery without mention of cerebral infarction 09/10/2011  . OA (osteoarthritis) of hip 06/17/2011  .  Osteoarthritis 01/08/2011  . Fibromyalgia 08/06/2010  . Dyslipidemia 08/06/2010  . Gout 08/06/2010  . Right carotid artery occlusion 08/06/2010    History  Smoking status  . Former Smoker -- 0.50 packs/day for 15 years  . Types: Cigarettes  . Quit date: 02/11/1986  Smokeless tobacco  . Never Used    History  Alcohol Use  . Yes    Comment: 05/14/2012 "have 2-3 drinks/yr" maybe    Family History  Problem Relation Age of Onset  . Heart disease Father     Heart Disease before age 38  . Heart attack Father 97  . Hypertension Father   . Peripheral vascular disease Father     Right  leg amputation  . Heart disease Mother     AAA  . Stroke Mother   . Aneurysm Mother   . Hypertension Mother   . Hypertension Brother   . Heart disease Brother     Heart Disease before age 8  . Hyperlipidemia Brother   . Hypertension Brother   . Diabetes Paternal Grandfather   . Diabetes Son     Review of Systems: Constitutional: no fever chills diaphoresis or fatigue or change in weight.  Head and neck: no hearing loss, no epistaxis, no photophobia or visual disturbance. Respiratory: No cough, shortness of breath or wheezing. Cardiovascular: No chest pain peripheral edema, palpitations. Gastrointestinal: No abdominal distention, no abdominal pain, no change in bowel habits hematochezia or melena. Genitourinary: No dysuria, no frequency, no urgency, no nocturia. Musculoskeletal:No arthralgias, no back pain, no gait disturbance or myalgias. Neurological: No dizziness, no headaches, no numbness, no seizures, no syncope, no weakness, no tremors. Hematologic: No lymphadenopathy, no easy  bruising. Psychiatric: No confusion, no hallucinations, no sleep disturbance.    Physical Exam: Filed Vitals:   07/27/13 1346  BP: 126/72  Pulse: 111   the general appearance reveals an overweight middle-aged woman in no acute distress.The head and neck exam reveals pupils equal and reactive.  Extraocular movements are full.  There is no scleral icterus.  The mouth and pharynx are normal.  The neck is supple.  The carotids reveal no bruits.  The jugular venous pressure is normal.  The  thyroid is not enlarged.  There is no lymphadenopathy.  The chest is clear to percussion and auscultation.  There are no rales or rhonchi.  Expansion of the chest is symmetrical.  The pulse is irregularly irregular. The precordium is quiet.  The first heart sound is normal.  The second heart sound is physiologically split.  There is no murmur gallop rub or click.  There is no abnormal lift or heave.  The abdomen is soft and nontender.  The bowel sounds are normal.  The liver and spleen are not enlarged.  There are no abdominal masses.  There are no abdominal bruits.  Extremities reveal good pedal pulses.  There is no phlebitis and there is trace ankle edema.  There is no cyanosis or clubbing.  Strength is normal and symmetrical in all extremities.  There is no lateralizing weakness.  There are no sensory deficits.  The skin is warm and dry.  There is no rash.   EKG shows atrial fibrillation with rapid ventricular response  Assessment / Plan: 1. paroxysmal atrial fibrillation on long-term anticoagulation 2. prior pulmonary emboli 3. essential hypertension with intolerance to metoprolol. 4. Chronic diastolic CHF  Plan: Increased nebivolol to 7.5 mg daily.  Continue amlodipine for blood pressure control dose of 5 mg daily.  Consider reduction  of amlodipine if blood pressure falls too low as we increase bystolic for rate control. She will utilize diltiazem only for symptomatic tachycardia. Recheck in 2 months for  followup office visit and EKG

## 2013-07-27 NOTE — Assessment & Plan Note (Signed)
Her weight is down 9 pounds since last visit.  She is not experiencing any peripheral edema.

## 2013-07-28 DIAGNOSIS — D509 Iron deficiency anemia, unspecified: Secondary | ICD-10-CM | POA: Diagnosis not present

## 2013-08-02 ENCOUNTER — Ambulatory Visit (INDEPENDENT_AMBULATORY_CARE_PROVIDER_SITE_OTHER): Payer: Medicare Other | Admitting: Podiatry

## 2013-08-02 VITALS — BP 118/76 | HR 113 | Temp 99.0°F | Resp 17 | Ht 62.0 in | Wt 232.0 lb

## 2013-08-02 DIAGNOSIS — L97509 Non-pressure chronic ulcer of other part of unspecified foot with unspecified severity: Secondary | ICD-10-CM

## 2013-08-02 NOTE — Patient Instructions (Signed)
Apply Silvadene cream to ulcer on left foot daily and cover with gauze Wear a surgical shoe on left foot

## 2013-08-03 ENCOUNTER — Other Ambulatory Visit: Payer: Self-pay

## 2013-08-03 DIAGNOSIS — Z1231 Encounter for screening mammogram for malignant neoplasm of breast: Secondary | ICD-10-CM

## 2013-08-03 NOTE — Progress Notes (Signed)
Patient ID: [name], female   DOB: 04/24/1934, 79 y.o.   MRN: [id number]  Subjective: This patient presents today: With her husband concern about a previously ulcerated area on the plantar left first MPJ. This area is a history of recurrent traumatic ulcerations with a history of healing and recurrence. The last visit for this problem was on 07/03/2013.  Objective: Hemorrhagic hyperkeratotic tissue plantar left first MPJ after debridement breakdown into a superficial ulceration measuring 7 x 1 mm with a granular base. There is no erythema, edema, warmth or active drainage noted around this area.  Assessment: Recurrence of traumatic ulceration plantar left first MPJ without clinical sign of infection  Plan: The ulcer site was debrided and a Silvadene dressing applied patient advised to return to flat surgical shoe on left foot and recommend daily application of Silvadene to the ulcer site  Reappoint x2 weeks

## 2013-08-07 ENCOUNTER — Other Ambulatory Visit: Payer: Self-pay | Admitting: Cardiology

## 2013-08-09 ENCOUNTER — Other Ambulatory Visit (HOSPITAL_COMMUNITY): Payer: Self-pay | Admitting: Cardiology

## 2013-08-09 ENCOUNTER — Other Ambulatory Visit: Payer: Self-pay

## 2013-08-09 MED ORDER — AMLODIPINE BESYLATE 5 MG PO TABS: 5.0000 mg | ORAL_TABLET | Freq: Every day | ORAL | Status: DC

## 2013-08-16 ENCOUNTER — Ambulatory Visit: Payer: Medicare Other | Admitting: Podiatry

## 2013-08-17 DIAGNOSIS — H2510 Age-related nuclear cataract, unspecified eye: Secondary | ICD-10-CM | POA: Diagnosis not present

## 2013-08-17 DIAGNOSIS — R197 Diarrhea, unspecified: Secondary | ICD-10-CM | POA: Diagnosis not present

## 2013-08-19 ENCOUNTER — Telehealth: Payer: Self-pay | Admitting: *Deleted

## 2013-08-19 NOTE — Telephone Encounter (Signed)
Talked with pt and she states started having diarrhea on Sunday and called and spoke with [facility]  Donaciano Eva  nurse  yesterday and was given instruction to bring  in stool specimen and was given a prescription for Diflucan 237m tid for 7 days.  The patient  states she is feeling much better today and has not started Diflucan and states is to have cataract surgery on Monday and if she continues to do better with improvement in diarrhea  does not plan to start Diflucan . Instructed to call if she does start Diflucan as she will need to be seen sooner and she states understanding

## 2013-08-23 DIAGNOSIS — I4891 Unspecified atrial fibrillation: Secondary | ICD-10-CM | POA: Diagnosis not present

## 2013-08-23 DIAGNOSIS — H2510 Age-related nuclear cataract, unspecified eye: Secondary | ICD-10-CM | POA: Diagnosis not present

## 2013-08-23 DIAGNOSIS — H25049 Posterior subcapsular polar age-related cataract, unspecified eye: Secondary | ICD-10-CM | POA: Diagnosis not present

## 2013-08-23 HISTORY — PX: EYE SURGERY: SHX253

## 2013-08-25 ENCOUNTER — Ambulatory Visit: Payer: Medicare Other | Admitting: Podiatry

## 2013-08-30 ENCOUNTER — Encounter: Payer: Self-pay | Admitting: Podiatry

## 2013-08-30 ENCOUNTER — Ambulatory Visit: Payer: Medicare Other | Admitting: Podiatry

## 2013-08-30 VITALS — BP 157/94 | HR 97 | Temp 96.9°F | Resp 17 | Ht 62.0 in | Wt 232.0 lb

## 2013-08-30 DIAGNOSIS — K219 Gastro-esophageal reflux disease without esophagitis: Secondary | ICD-10-CM | POA: Diagnosis not present

## 2013-08-30 DIAGNOSIS — L97509 Non-pressure chronic ulcer of other part of unspecified foot with unspecified severity: Secondary | ICD-10-CM | POA: Diagnosis not present

## 2013-08-30 DIAGNOSIS — M949 Disorder of cartilage, unspecified: Secondary | ICD-10-CM | POA: Diagnosis not present

## 2013-08-30 DIAGNOSIS — I509 Heart failure, unspecified: Secondary | ICD-10-CM | POA: Diagnosis not present

## 2013-08-30 DIAGNOSIS — M899 Disorder of bone, unspecified: Secondary | ICD-10-CM | POA: Diagnosis not present

## 2013-08-30 DIAGNOSIS — D509 Iron deficiency anemia, unspecified: Secondary | ICD-10-CM | POA: Diagnosis not present

## 2013-08-30 DIAGNOSIS — E785 Hyperlipidemia, unspecified: Secondary | ICD-10-CM | POA: Diagnosis not present

## 2013-08-30 NOTE — Patient Instructions (Signed)
Continue to wear surgical shoe on left foot Apply cut out felt pad around the skin ulcer area on the left foot Apply Vaseline to ulcer area and cover with gauze

## 2013-08-30 NOTE — Progress Notes (Signed)
Patient ID: [name], female   DOB: [birth year], 79 y.o.   MRN: [id number]  Subjective: This patient presents for ongoing care for traumatic skin ulcer plantar first MPJ. The ulcer has a history of healing with recurrence.  Objective: Orientated x3 white female presents with her husband Hemorrhagic keratoses plantar first MPJ and remains closed after debridement  Assessment: Pre-ulcerative keratoses plantar left first MPJ  Plan: The ulcer site was debrided and a cut out surgical felt pad applied Patient will continue wearing surgical shoe with Plastizote insole Patient will attach cut out surgical felt pad to offload plantar left first MPJ Patient will apply Vaseline to the skin ulcer left foot  If plantar ulcer remains closed we'll transition patient comes surgical shoe into a shoe with an accommodative orthotic on a gradual basis  Reappoint x30 days

## 2013-09-06 DIAGNOSIS — IMO0001 Reserved for inherently not codable concepts without codable children: Secondary | ICD-10-CM | POA: Diagnosis not present

## 2013-09-06 DIAGNOSIS — M1990 Unspecified osteoarthritis, unspecified site: Secondary | ICD-10-CM | POA: Diagnosis not present

## 2013-09-06 DIAGNOSIS — M109 Gout, unspecified: Secondary | ICD-10-CM | POA: Diagnosis not present

## 2013-09-08 DIAGNOSIS — R197 Diarrhea, unspecified: Secondary | ICD-10-CM | POA: Diagnosis not present

## 2013-09-09 ENCOUNTER — Ambulatory Visit: Payer: Medicare Other

## 2013-09-09 ENCOUNTER — Ambulatory Visit (INDEPENDENT_AMBULATORY_CARE_PROVIDER_SITE_OTHER): Payer: Medicare Other | Admitting: Pharmacist

## 2013-09-09 DIAGNOSIS — I2699 Other pulmonary embolism without acute cor pulmonale: Secondary | ICD-10-CM | POA: Diagnosis not present

## 2013-09-09 LAB — POCT INR: INR: 3.7

## 2013-09-14 ENCOUNTER — Encounter: Payer: Self-pay | Admitting: Family

## 2013-09-15 ENCOUNTER — Ambulatory Visit (INDEPENDENT_AMBULATORY_CARE_PROVIDER_SITE_OTHER): Payer: Medicare Other | Admitting: Family

## 2013-09-15 ENCOUNTER — Ambulatory Visit (HOSPITAL_COMMUNITY)
Admission: RE | Admit: 2013-09-15 | Discharge: 2013-09-15 | Disposition: A | Discharge status: Home / Self Care | Payer: Medicare Other | Source: Ambulatory Visit | Attending: Family | Admitting: Family

## 2013-09-15 ENCOUNTER — Encounter: Payer: Self-pay | Admitting: Family

## 2013-09-15 VITALS — BP 142/78 | HR 89 | Ht 62.0 in | Wt 241.0 lb

## 2013-09-15 DIAGNOSIS — Z48812 Encounter for surgical aftercare following surgery on the circulatory system: Secondary | ICD-10-CM | POA: Diagnosis not present

## 2013-09-15 DIAGNOSIS — I6529 Occlusion and stenosis of unspecified carotid artery: Secondary | ICD-10-CM | POA: Insufficient documentation

## 2013-09-15 NOTE — Patient Instructions (Addendum)
Stroke Prevention Some medical conditions and behaviors are associated with an increased chance of having a stroke. You may prevent a stroke by making healthy choices and managing medical conditions. HOW CAN I REDUCE MY RISK OF HAVING A STROKE?   Stay physically active. Get at least 30 minutes of activity on most or all days.  Do not smoke. It may also be helpful to avoid exposure to secondhand smoke.  Limit alcohol use. Moderate alcohol use is considered to be:  No more than 2 drinks per day for men.  No more than 1 drink per day for nonpregnant women.  Eat healthy foods. This involves:  Eating 5 or more servings of fruits and vegetables a day.  Making dietary changes that address high blood pressure (hypertension), high cholesterol, diabetes, or obesity.  Manage your cholesterol levels.  Making food choices that are high in fiber and low in saturated fat, trans fat, and cholesterol may control cholesterol levels.  Take any prescribed medicines to control cholesterol as directed by your health care provider.  Manage your diabetes.  Controlling your carbohydrate and sugar intake is recommended to manage diabetes.  Take any prescribed medicines to control diabetes as directed by your health care provider.  Control your hypertension.  Making food choices that are low in salt (sodium), saturated fat, trans fat, and cholesterol is recommended to manage hypertension.  Take any prescribed medicines to control hypertension as directed by your health care provider.  Maintain a healthy weight.  Reducing calorie intake and making food choices that are low in sodium, saturated fat, trans fat, and cholesterol are recommended to manage weight.  Stop drug abuse.  Avoid taking birth control pills.  Talk to your health care provider about the risks of taking birth control pills if you are over 35 years old, smoke, get migraines, or have ever had a blood clot.  Get evaluated for sleep  disorders (sleep apnea).  Talk to your health care provider about getting a sleep evaluation if you snore a lot or have excessive sleepiness.  Take medicines only as directed by your health care provider.  For some people, aspirin or blood thinners (anticoagulants) are helpful in reducing the risk of forming abnormal blood clots that can lead to stroke. If you have the irregular heart rhythm of atrial fibrillation, you should be on a blood thinner unless there is a good reason you cannot take them.  Understand all your medicine instructions.  Make sure that other conditions (such as anemia or atherosclerosis) are addressed. SEEK IMMEDIATE MEDICAL CARE IF:   You have sudden weakness or numbness of the face, arm, or leg, especially on one side of the body.  Your face or eyelid droops to one side.  You have sudden confusion.  You have trouble speaking (aphasia) or understanding.  You have sudden trouble seeing in one or both eyes.  You have sudden trouble walking.  You have dizziness.  You have a loss of balance or coordination.  You have a sudden, severe headache with no known cause.  You have new chest pain or an irregular heartbeat. Any of these symptoms may represent a serious problem that is an emergency. Do not wait to see if the symptoms will go away. Get medical help at once. Call your local emergency services (911 in [place] Do not drive yourself to the hospital. Document Released: 03/07/2004 Document Revised: 06/14/2013 Document Reviewed: 07/31/2012 [employer] Patient Information 2015 [employer]. This information is not intended to replace advice given   to you by your health care provider. Make sure you discuss any questions you have with your health care provider.  

## 2013-09-15 NOTE — Progress Notes (Signed)
Established Carotid Patient   History of Present Illness  [name] is a 79 y.o. female patient of Dr. Donnetta Hutching seen for left-sided carotid stenosis with a known right ICA occlusion.  She returns today for follow up.  Patient has not had previous carotid artery intervention.  She denies any remote or recent stroke of TIA symptoms.  Denies steal symptoms.  Does not have claudication symptoms.  Tires easily since had the PE.  Has mild neuropathy in both feet for years, attributes to back issues.  She had detatched retina repaired Dec. 2, 2014, had cataract surgery July, 2015, can see much better, no more headaches. She has Crohn's Disease and is under evaluation for any occult GI bleed, is anemic, states her Hg is increasing with oral iron. April, 2014 she had a left total shoulder replaced, then developed PE's, she now takes coumadin.  She has had lumbar spine surgery.  She has severe generalized OA, has affected her feet, has trouble walking.  Is wearing post op shoe left foot for heeling wound from callus trimming from a pedicure, per pt. She also has fibromyalgia. She states her blood pressure usually runs 124-132/ 68-72. She states she has proven white coat syndrome.   Pt Diabetic: No   Pt smoker: former smoker, quit 25 years ago  Pt meds include:  Statin : No: cannot tolerate  Betablocker: Yes  ASA: No  Other anticoagulants/antiplatelets: coumadin   Past Medical History  Diagnosis Date  . Hypertension   . Gout   . Tinnitus   . Acid reflux disease   . Pulmonary embolism 2011    a. Hx of PE in 02/2009 after R hip surgery, venous dopplers negative, long-term Coumadin.  . Vertigo   . Crohn disease   . Long term current use of anticoagulant   . GERD (gastroesophageal reflux disease)   . Recurrent upper respiratory infection (URI)     BRONCHITIS FEB 2013--SLIGHT COUGH NON-PRODUCTIVE NOW  . H/O hiatal hernia   . PVC (premature ventricular contraction)     PT STATES HX OF  PVC'S ON EKG  . Peripheral vascular disease     KNOWN RIGHT INTERNAL CAROTID ARTERY OCCLUSION (NO STROKE)  --40 TO 59% STENOSIS LEFT ICA-FOLLOWED BY DR. EARLY WITH DOPPLER STUDY EVERY 6 MONTHS  . Fibromyalgia   . High triglycerides   . Obstructive sleep apnea on CPAP   . Crohn's disease   . History of stomach ulcers 1950's  . Osteoarthritis     PAIN AND OA LEF T HIP AND BOTH SHOULDERS ARE BONE ON BONE AND PAINFUL  . Recurrent UTI (urinary tract infection)     "on daily medicine" (05/14/2012)  . Carotid artery occlusion   . Detached retina   . Chronic diastolic CHF (congestive heart failure)     Hypertensive heart disease    Social History History  Substance Use Topics  . Smoking status: Former Smoker -- 0.50 packs/day for 15 years    Types: Cigarettes    Quit date: 02/11/1986  . Smokeless tobacco: Never Used  . Alcohol Use: Yes     Comment: 05/14/2012 "have 2-3 drinks/yr" maybe    Family History Family History  Problem Relation Age of Onset  . Heart disease Father     Heart Disease before age 48  . Heart attack Father 31  . Hypertension Father   . Peripheral vascular disease Father     Right  leg amputation  . Heart disease Mother  AAA  . Stroke Mother   . Aneurysm Mother   . Hypertension Mother   . Hypertension Brother   . Heart disease Brother     Heart Disease before age 76  . Hyperlipidemia Brother   . Hypertension Brother   . Diabetes Paternal Grandfather   . Diabetes Son     Surgical History Past Surgical History  Procedure Laterality Date  . Femur fracture surgery Right 02/21/09    Open reduction internal fixation of right periprosthetic  femur fracture utilizing Zimmer cables times fiv  . Hammer toe surgery Left 10/25/08    "toe next to big to" (05/14/2012)  . Back surgery  10/29/06    Central and foraminal decompression L3-L4, L4-L5, and L5-S1 with inspection of L4-L5 and L5-S1 disc on the right  . Knee arthroscopy Right 07/26/05  . Total hip  arthroplasty Right 12/08/02    Osteonics total hip replacement  . Excision morton's neuroma Right 05/20/00    "foot" (05/14/2012)  . Total hip arthroplasty  06/17/2011    Procedure: TOTAL HIP ARTHROPLASTY;  Surgeon: Gearlean Alf, MD;  Location: [facility];  Service: Orthopedics;  Laterality: Left;  . Joint replacement  2009    Right Hip replacement  . Joint replacement  2012    Right knee replacement  . Joint replacement  06/17/11    Left Hip replacement  . Fracture surgery  2011    Right Femur Fx  . Total shoulder arthroplasty Left 05/14/2012  . Tonsillectomy and adenoidectomy  1958?  [name] Kitchen Appendectomy  1950's  . Cholecystectomy  ~ 1988  . Vaginal hysterectomy  1971  . Dilation and curettage of uterus      "several; from miscarriages" (05/14/2012)  . Decompressive lumbar laminectomy level 3  ~ 2002  . Replacement total knee Right 02/19/2010  . Cardiac catheterization  1970's    "maybe 2" (05/14/2012)  . Cardiac catheterization    . Cataract extraction w/ intraocular lens implant Right 2009  . Total shoulder arthroplasty Left 05/14/2012    Procedure: TOTAL SHOULDER ARTHROPLASTY;  Surgeon: Marin Shutter, MD;  Location: [facility];  Service: Orthopedics;  Laterality: Left;  [name] Kitchen Eye surgery Left 2014    Detached retina  . Eye surgery Left 2015    Allergies  Allergen Reactions  . Ciprofloxacin     RASH  . Codeine     NAUSEA  . Cymbalta [Duloxetine Hcl]     Nausea VOMITING AND ABDOMINAL PAIN, HEADACHE, JUST ABOUT EVERY SIDE EFFECT THE DRUG HAS  . Metoprolol     Legs like cement  . Nortriptyline     Nightmares  . Penicillins     RASH  & ITCHING   --PT STATES SHE CAN TAKE KEFLEX PO AND IV CEPHALOSPORINS  . Statins     Leg cramps  . Tetanus Toxoids     SWELLING, REDNESS  WHOLE ARM  . Lyrica [Pregabalin] Diarrhea, Nausea And Vomiting and Rash    Current Outpatient Prescriptions  Medication Sig Dispense Refill  . acetaminophen (TYLENOL) 500 MG tablet Take 500 mg by mouth daily as needed for  headache.      . allopurinol (ZYLOPRIM) 300 MG tablet Take 300 mg by mouth Daily.       [name] Kitchen amLODipine (NORVASC) 5 MG tablet Take 1 tablet (5 mg total) by mouth daily.  30 tablet  6  . calcium citrate-vitamin D (CITRACAL+D) 315-200 MG-UNIT per tablet Take 1 tablet by mouth daily.      . CELEBREX  200 MG capsule Take 200 mg by mouth Daily.      . cetirizine (ZYRTEC) 10 MG tablet Take 10 mg by mouth daily.      . cholecalciferol (VITAMIN D) 1000 UNITS tablet Take 3,000 Units by mouth daily.      [name] Kitchen diltiazem (CARDIZEM) 60 MG tablet Take 60 mg by mouth every 6 (six) hours as needed. For symptomatic afib      . diphenhydrAMINE (BENADRYL) 25 mg capsule Take 25 mg by mouth at bedtime as needed for sleep. For sleep      . dorzolamide-timolol (COSOPT) 22.3-6.8 MG/ML ophthalmic solution       . fenofibrate 160 MG tablet Take 160 mg by mouth every evening.       . furosemide (LASIX) 40 MG tablet Take 0.5 tablets (20 mg total) by mouth daily as needed.  30 tablet  1  . gentamicin (GARAMYCIN) 0.3 % ophthalmic solution       . HYDROcodone-acetaminophen (NORCO/VICODIN) 5-325 MG per tablet Take 1 tablet by mouth every 6 (six) hours.       . indapamide (LOZOL) 1.25 MG tablet Take 1.25 mg by mouth every morning.      [name] Kitchen ketoconazole (NIZORAL) 2 % cream Apply 1 application topically 2 (two) times daily as needed for irritation.       . Magnesium 400 MG CAPS Take 400 mg by mouth daily.       . Multiple Vitamins-Minerals (MULTIVITAMIN WITH MINERALS) tablet Take 1 tablet by mouth daily.      . nebivolol (BYSTOLIC) 5 MG tablet Take 1.5 tablets (7.5 mg total) by mouth daily.  45 tablet  3  . omeprazole (PRILOSEC) 40 MG capsule Take 40 mg by mouth daily with supper.       . vitamin C (ASCORBIC ACID) 500 MG tablet Take 500 mg by mouth daily.      [name] Kitchen warfarin (COUMADIN) 2.5 MG tablet 1 tablet daily or as directed by coumadin clinic  35 tablet  3  . cefUROXime (CEFTIN) 250 MG tablet       . latanoprost (XALATAN) 0.005 %  ophthalmic solution Place 1 drop into both eyes at bedtime.      . LOTEMAX 0.5 % GEL       . [DISCONTINUED] amitriptyline (ELAVIL) 10 MG tablet Take 10 mg by mouth at bedtime.        No current facility-administered medications for this visit.    Review of Systems : See HPI for pertinent positives and negatives.  Physical Examination  Filed Vitals:   09/15/13 1100 09/15/13 1104  BP: 141/88 142/78  Pulse: 102 89  Height:  5' 2"  (1.575 m)  Weight:  241 lb (109.317 kg)  SpO2:  96%   Body mass index is 44.07 kg/(m^2).  General: WDWN morbidly obese female in NAD  GAIT: slow, deliberate, using cane, wearing post op shoe left foot  Eyes: PERRLA  Pulmonary: CTAB, Negative Rales, Negative rhonchi, & Negative wheezing.  Cardiac: regular Rhythm , Negative Murmurs.   VASCULAR EXAM  Carotid Bruits  Left  Right    Positive  Negative    Radial pulses are 2+ palpable and equal.   LE Pulses  LEFT  RIGHT   POPLITEAL  not palpable  not palpable    Gastrointestinal: soft, nontender, BS WNL, no r/g, negative masses.  Musculoskeletal: Negative muscle atrophy/wasting. M/S 4/5 throughout, Extremities without ischemic changes.  Neurologic: A&O X 3; Appropriate Affect ; SENSATION ;normal;  Speech is normal  CN 2-12 intact, Pain and light touch intact in extremities, Motor exam as listed above.   Non-Invasive Vascular Imaging CAROTID DUPLEX 09/15/2013   CEREBROVASCULAR DUPLEX EVALUATION    INDICATION: Carotid disease    PREVIOUS INTERVENTION(S):     DUPLEX EXAM:     RIGHT  LEFT  Peak Systolic Velocities (cm/s) End Diastolic Velocities (cm/s) Plaque LOCATION Peak Systolic Velocities (cm/s) End Diastolic Velocities (cm/s) Plaque  46 0  CCA PROXIMAL 61 12   28 5   CCA MID 74 13   21 4  HM CCA DISTAL 62 15 HT  328 29 HM ECA 202 26 HT  Occluded  HT ICA PROXIMAL 337 76 HT  Occluded  HT ICA MID 102 30   Occluded  HT ICA DISTAL 79 26      ICA / CCA Ratio (PSV) 5.4  Antegrade Vertebral Flow  Antegrade   Brachial Systolic Pressure (mmHg)   Multiphasic (subclavian artery) Brachial Artery Waveforms Multiphasic (subclavian artery)    Plaque Morphology:  HM = Homogeneous, HT = Heterogeneous, CP = Calcific Plaque, SP = Smooth Plaque, IP = Irregular Plaque     ADDITIONAL FINDINGS:   No significant stenosis of the bilateral common carotid arteries.   The bilateral external carotid artery stenoses noted.    IMPRESSION: 1. Known occlusion of the right internal carotid artery. 2. Doppler velocities suggest a 60-79% stenosis of the left proximal internal carotid artery, however velocities may be overestimated due to compensatory flow from the known right internal carotid artery occlusion.    Compared to the previous exam:  No significant change in the left internal carotid artery noted when compared to the previous exam on 03/17/13.       Assessment:   SALISHA BARDSLEY is a 79 y.o. female who presents with asymptomatic nown occlusion of the right internal carotid artery. Doppler velocities suggest a 60-79% stenosis of the left proximal internal carotid artery, however velocities may be overestimated due to compensatory flow from the known right internal carotid artery occlusion. No significant change in the left internal carotid artery noted when compared to the previous exam on 03/17/13   Plan: Follow-up in 6 months with Carotid Duplex scan.   I discussed in depth with the patient the nature of atherosclerosis, and emphasized the importance of maximal medical management including strict control of blood pressure, blood glucose, and lipid levels, obtaining regular exercise, and continued cessation of smoking.  The patient is aware that without maximal medical management the underlying atherosclerotic disease process will progress, limiting the benefit of any interventions. The patient was given information about stroke prevention and what symptoms should prompt the patient to seek immediate  medical care. Thank you for allowing [place] to participate in this patient's care.  Clemon Chambers, RN, MSN, FNP-C [facility] Office: [phone]  Clinic Physician: Scot Dock  09/15/2013 10:58 AM

## 2013-09-15 NOTE — Addendum Note (Signed)
Addended by: Mena Goes on: 09/15/2013 04:33 PM   Modules accepted: Orders

## 2013-09-21 ENCOUNTER — Ambulatory Visit: Payer: Medicare Other | Admitting: Cardiology

## 2013-09-21 ENCOUNTER — Ambulatory Visit (INDEPENDENT_AMBULATORY_CARE_PROVIDER_SITE_OTHER): Payer: Medicare Other | Admitting: Pharmacist Clinician (PhC)/ Clinical Pharmacy Specialist

## 2013-09-21 ENCOUNTER — Ambulatory Visit (INDEPENDENT_AMBULATORY_CARE_PROVIDER_SITE_OTHER): Payer: Medicare Other | Admitting: Cardiology

## 2013-09-21 ENCOUNTER — Encounter: Payer: Self-pay | Admitting: Cardiology

## 2013-09-21 VITALS — BP 126/80 | HR 113 | Ht 62.0 in | Wt 244.0 lb

## 2013-09-21 DIAGNOSIS — I6529 Occlusion and stenosis of unspecified carotid artery: Secondary | ICD-10-CM

## 2013-09-21 DIAGNOSIS — I10 Essential (primary) hypertension: Secondary | ICD-10-CM

## 2013-09-21 DIAGNOSIS — I2699 Other pulmonary embolism without acute cor pulmonale: Secondary | ICD-10-CM | POA: Diagnosis not present

## 2013-09-21 DIAGNOSIS — I5033 Acute on chronic diastolic (congestive) heart failure: Secondary | ICD-10-CM

## 2013-09-21 DIAGNOSIS — I5032 Chronic diastolic (congestive) heart failure: Secondary | ICD-10-CM | POA: Diagnosis not present

## 2013-09-21 DIAGNOSIS — I4891 Unspecified atrial fibrillation: Secondary | ICD-10-CM

## 2013-09-21 DIAGNOSIS — I509 Heart failure, unspecified: Secondary | ICD-10-CM

## 2013-09-21 DIAGNOSIS — I6521 Occlusion and stenosis of right carotid artery: Secondary | ICD-10-CM

## 2013-09-21 LAB — POCT INR: INR: 3.2

## 2013-09-21 MED ORDER — DILTIAZEM HCL ER COATED BEADS 120 MG PO CP24: 120.0000 mg | ORAL_CAPSULE | Freq: Every day | ORAL | Status: DC

## 2013-09-21 MED ORDER — NEBIVOLOL HCL 5 MG PO TABS: 5.0000 mg | ORAL_TABLET | Freq: Every day | ORAL | Status: DC

## 2013-09-21 NOTE — Assessment & Plan Note (Signed)
She has chronic exertional dyspnea which is multifactorial.  Unfortunately her weight remains elevated and she has gained 8 pounds since last visit.  She intends to start working with her trainer to improve her leg strength so that she can walk more.

## 2013-09-21 NOTE — Assessment & Plan Note (Signed)
Blood pressure was remaining stable on current medication

## 2013-09-21 NOTE — Progress Notes (Signed)
[name] Date of Birth:  1934/07/31 [facility] [address] [place], [place]  [address] [phone]        Fax   [phone]   History of Present Illness: This pleasant 79 year old woman is seen for followup office visit.  She was admitted to the hospital in February 2015 because of new onset atrial fibrillation. She was already on long-term Coumadin. Diltiazem for rate control was added to her regimen in the hospital. The patient has a past history of pulmonary emboli and is on long-term Coumadin. Her INR has been stable. She has a history of essential hypertension and a history of right carotid artery occlusion. She has a past history of pulmonary emboli in January 2011 after right hip surgery and at that time venous Dopplers of the legs were negative for a source of embolization and she has remained on long-term Coumadin. 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 Donnetta Hutching. She does not have any significant coronary disease and she had cardiac catheterization in 1994 showing only mild plaque and she had a nuclear stress test in December 2011 showing no ischemia and her ejection fraction was 78%. Earlier this year she had left total shoulder replacement surgery by Dr. Onnie Graham. In October she saw pulmonary because of exertional dyspnea. She had a chest x-ray on 11/24/12 showing cardiomegaly. She had a VQ scan which was negative for new pulmonary emboli. The patient had an echocardiogram on 05/25/12 which showed an ejection fraction of 55-60% and no significant valve abnormalities. 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 This occurred over the Thanksgiving holiday. Her surgery had to be delayed a week in order to allow her supratherapeutic INR to correct itself. Her vision is still not back to baseline in the left eye. She anticipates  that she will need to have cataract surgery later this spring. Since we last saw her she has had ongoing problems with fluctuating blood pressure elevation as well as severe weakness and lack of energy and feeling that her legs were encased in cement when she tried to walk.  She had a buccal smear done at her PCP office for DNA.  She states that the DNA results showed that she did not metabolize metoprolol well.  Her metoprolol was stopped and she was switched to nebivolol and she has felt much better.  She continues to have rapid response to her atrial fibrillation.   Current Outpatient Prescriptions  Medication Sig Dispense Refill  . acetaminophen (TYLENOL) 500 MG tablet Take 500 mg by mouth daily as needed for headache.      . allopurinol (ZYLOPRIM) 300 MG tablet Take 300 mg by mouth Daily.       [name] Kitchen amLODipine (NORVASC) 5 MG tablet Take 1 tablet (5 mg total) by mouth daily.  30 tablet  6  . calcium citrate-vitamin D (CITRACAL+D) 315-200 MG-UNIT per tablet Take 1 tablet by mouth daily.      . cefUROXime (CEFTIN) 250 MG tablet       . CELEBREX 200 MG capsule Take 200 mg by mouth Daily.      . cetirizine (ZYRTEC) 10 MG tablet Take 10 mg by mouth daily.      . cholecalciferol (VITAMIN D) 1000 UNITS tablet Take 4,000 Units by mouth daily.       [name] Kitchen  diltiazem (CARDIZEM CD) 120 MG 24 hr capsule Take 1 capsule (120 mg total) by mouth daily.  30 capsule  6  . diltiazem (CARDIZEM) 60 MG tablet Take 60 mg by mouth every 6 (six) hours as needed. For symptomatic afib      . diphenhydrAMINE (BENADRYL) 25 mg capsule Take 25 mg by mouth at bedtime as needed for sleep. For sleep      . dorzolamide-timolol (COSOPT) 22.3-6.8 MG/ML ophthalmic solution Place 1 drop into both eyes 2 (two) times daily.       . fenofibrate 160 MG tablet Take 160 mg by mouth every evening.       . furosemide (LASIX) 40 MG tablet Take 0.5 tablets (20 mg total) by mouth daily as needed.  30 tablet  1  . HYDROcodone-acetaminophen  (NORCO/VICODIN) 5-325 MG per tablet Take 1 tablet by mouth every 6 (six) hours.       . indapamide (LOZOL) 1.25 MG tablet Take 1.25 mg by mouth every morning.      [name] Kitchen ketoconazole (NIZORAL) 2 % cream Apply 1 application topically 2 (two) times daily as needed for irritation.       . Magnesium 400 MG CAPS Take 400 mg by mouth daily.       . Multiple Vitamins-Minerals (MULTIVITAMIN WITH MINERALS) tablet Take 1 tablet by mouth daily.      . nebivolol (BYSTOLIC) 5 MG tablet Take 1 tablet (5 mg total) by mouth daily.  30 tablet  6  . omeprazole (PRILOSEC) 40 MG capsule Take 40 mg by mouth daily with supper.       . vitamin C (ASCORBIC ACID) 500 MG tablet Take 500 mg by mouth daily.      [name] Kitchen warfarin (COUMADIN) 2.5 MG tablet 1 tablet daily or as directed by coumadin clinic  35 tablet  3  . [DISCONTINUED] amitriptyline (ELAVIL) 10 MG tablet Take 10 mg by mouth at bedtime.        No current facility-administered medications for this visit.    Allergies  Allergen Reactions  . Ciprofloxacin     RASH  . Codeine     NAUSEA  . Cymbalta [Duloxetine Hcl]     Nausea VOMITING AND ABDOMINAL PAIN, HEADACHE, JUST ABOUT EVERY SIDE EFFECT THE DRUG HAS  . Metoprolol     Legs like cement  . Nortriptyline     Nightmares  . Penicillins     RASH  & ITCHING   --PT STATES SHE CAN TAKE KEFLEX PO AND IV CEPHALOSPORINS  . Statins     Leg cramps  . Tetanus Toxoids     SWELLING, REDNESS  WHOLE ARM  . Lyrica [Pregabalin] Diarrhea, Nausea And Vomiting and Rash    Patient Active Problem List   Diagnosis Date Noted  . Aftercare following surgery of the circulatory system, [place] 09/15/2013  . Essential hypertension 06/17/2013  . Atrial fibrillation with RVR 04/01/2013  . Morbid obesity 04/01/2013  . OSA on CPAP 04/01/2013  . Chronic diastolic CHF (congestive heart failure)   . H/o recurrent pulmonary embolism, on Coumadin 05/24/2012  . Acute on chronic diastolic congestive heart failure 05/24/2012  . Normocytic  anemia 05/24/2012  . Thrombocytopenia 05/24/2012  . Inadequate anticoagulation 05/24/2012  . Elevated brain natriuretic peptide (BNP) level 05/24/2012  . Shoulder arthritis 05/15/2012  . Occlusion and stenosis of carotid artery without mention of cerebral infarction 09/10/2011  . OA (osteoarthritis) of hip 06/17/2011  . Osteoarthritis 01/08/2011  . Fibromyalgia 08/06/2010  . Dyslipidemia 08/06/2010  .  Gout 08/06/2010  . Right carotid artery occlusion 08/06/2010    History  Smoking status  . Former Smoker -- 0.50 packs/day for 15 years  . Types: Cigarettes  . Quit date: 02/11/1986  Smokeless tobacco  . Never Used    History  Alcohol Use  . Yes    Comment: 05/14/2012 "have 2-3 drinks/yr" maybe    Family History  Problem Relation Age of Onset  . Heart disease Father     Heart Disease before age 72  . Heart attack Father 88  . Hypertension Father   . Peripheral vascular disease Father     Right  leg amputation  . Heart disease Mother     AAA  . Stroke Mother   . Aneurysm Mother   . Hypertension Mother   . Hypertension Brother   . Heart disease Brother     Heart Disease before age 16  . Hyperlipidemia Brother   . Heart attack Brother   . Hypertension Brother   . Diabetes Paternal Grandfather   . Diabetes Son     Review of Systems: Constitutional: no fever chills diaphoresis or fatigue or change in weight.  Head and neck: no hearing loss, no epistaxis, no photophobia or visual disturbance. Respiratory: No cough, shortness of breath or wheezing. Cardiovascular: No chest pain peripheral edema, palpitations. Gastrointestinal: No abdominal distention, no abdominal pain, no change in bowel habits hematochezia or melena. Genitourinary: No dysuria, no frequency, no urgency, no nocturia. Musculoskeletal:No arthralgias, no back pain, no gait disturbance or myalgias. Neurological: No dizziness, no headaches, no numbness, no seizures, no syncope, no weakness, no  tremors. Hematologic: No lymphadenopathy, no easy bruising. Psychiatric: No confusion, no hallucinations, no sleep disturbance.    Physical Exam: Filed Vitals:   09/21/13 1442  BP: 126/80  Pulse: 113   the general appearance reveals an overweight middle-aged woman in no acute distress.The head and neck exam reveals pupils equal and reactive.  Extraocular movements are full.  There is no scleral icterus.  The mouth and pharynx are normal.  The neck is supple.  The carotids reveal no bruits.  The jugular venous pressure is normal.  The  thyroid is not enlarged.  There is no lymphadenopathy.  The chest is clear to percussion and auscultation.  There are no rales or rhonchi.  Expansion of the chest is symmetrical.  The pulse is irregularly irregular. The precordium is quiet.  The first heart sound is normal.  The second heart sound is physiologically split.  There is no murmur gallop rub or click.  There is no abnormal lift or heave.  The abdomen is soft and nontender.  The bowel sounds are normal.  The liver and spleen are not enlarged.  There are no abdominal masses.  There are no abdominal bruits.  Extremities reveal good pedal pulses.  There is no phlebitis and there is trace ankle edema.  There is no cyanosis or clubbing.  Strength is normal and symmetrical in all extremities.  There is no lateralizing weakness.  There are no sensory deficits.  The skin is warm and dry.  There is no rash.   EKG shows atrial fibrillation with rapid ventricular response.  Heart rate is 113  Assessment / Plan: 1. paroxysmal atrial fibrillation on long-term anticoagulation 2. prior pulmonary emboli 3. essential hypertension with intolerance to metoprolol. 4. Chronic diastolic CHF  Plan: She is having a difficult time paying for Bystolic.  We will reduce the dose to just 5 mg a day and  for additional rate control of her atrial fibrillation we will begin diltiazem CD 120 mg daily.  We may have to go up on the dose  subsequently if her pulse remains high. Return in 3 months for followup office visit and EKG.

## 2013-09-21 NOTE — Assessment & Plan Note (Signed)
No TIA symptoms.

## 2013-09-21 NOTE — Patient Instructions (Signed)
Your physician has recommended you make the following change in your medication:   1. Decrease Bystolic to 5 mg daily.   2. Start Cardizem CD 120 mg 1 capsule daily.  Your physician recommends that you schedule a follow-up appointment in: 3 months with Dr. Mare Ferrari with EKG.

## 2013-09-22 DIAGNOSIS — L97509 Non-pressure chronic ulcer of other part of unspecified foot with unspecified severity: Secondary | ICD-10-CM

## 2013-09-24 ENCOUNTER — Telehealth: Payer: Self-pay | Admitting: Cardiology

## 2013-09-24 MED ORDER — NEBIVOLOL HCL 10 MG PO TABS: 10.0000 mg | ORAL_TABLET | Freq: Every day | ORAL | Status: DC

## 2013-09-24 NOTE — Telephone Encounter (Signed)
Advised patient and new Rx sent to pharmacy

## 2013-09-24 NOTE — Telephone Encounter (Signed)
After decreasing the Bystolic and starting the Diltiazem she has started with the very heavy legs and dry cough. This medication makes her feel tired states just feels awful and just can not function. Blood pressure 120's-130's/60's-74 hr mainly mid 80's-90's but a couple of times above 100. Patient is willing to try increasing the Bystolic as Dr. Mare Ferrari had originally wanted her to do and will just pay for it if that's what he recommends. Will forward to him for review

## 2013-09-24 NOTE — Telephone Encounter (Signed)
Please stop the diltiazem and increase bystolic up to 10 mg daily

## 2013-09-24 NOTE — Telephone Encounter (Signed)
New message      Talk to a nurse regarding a possible side effect on one of her medications

## 2013-09-27 ENCOUNTER — Ambulatory Visit (INDEPENDENT_AMBULATORY_CARE_PROVIDER_SITE_OTHER): Payer: Medicare Other | Admitting: Podiatry

## 2013-09-27 ENCOUNTER — Encounter: Payer: Self-pay | Admitting: Podiatry

## 2013-09-27 VITALS — BP 149/73 | HR 102 | Resp 20

## 2013-09-27 DIAGNOSIS — L97509 Non-pressure chronic ulcer of other part of unspecified foot with unspecified severity: Secondary | ICD-10-CM | POA: Diagnosis not present

## 2013-09-27 DIAGNOSIS — K219 Gastro-esophageal reflux disease without esophagitis: Secondary | ICD-10-CM | POA: Diagnosis not present

## 2013-09-27 DIAGNOSIS — D509 Iron deficiency anemia, unspecified: Secondary | ICD-10-CM | POA: Diagnosis not present

## 2013-09-27 NOTE — Patient Instructions (Signed)
Wear surgical shoe on the left foot x7 days Begin wearing regular shoe and surgical shoe alternating one day at a time

## 2013-09-28 ENCOUNTER — Encounter: Payer: Self-pay | Admitting: Podiatry

## 2013-09-28 NOTE — Progress Notes (Signed)
Patient ID: [name], female   DOB: 21-May-1934, 79 y.o.   MRN: [id number]  Subjective: This patient presents with her husband today for ongoing care for traumatic plantar skin ulcer on the left foot  Objective: Hemorrhagic keratoses plantar first MPJ area remains closed after debridement. There is no erythema, edema, warmth noted in around this area.  Assessment: Traumatic skin ulcer pre-ulcerative, left foot  Plan: The plantar hyperkeratotic hemorrhagic tissue was debrided  Patient will can do you wearing Darco shoe with Plastizote insole an additional 7 days After 7 days patient will alternates surgical shoe and regular shoe with custom insole on a daily basis  Reappoint x30 days

## 2013-10-08 DIAGNOSIS — L538 Other specified erythematous conditions: Secondary | ICD-10-CM | POA: Diagnosis not present

## 2013-10-12 ENCOUNTER — Ambulatory Visit (INDEPENDENT_AMBULATORY_CARE_PROVIDER_SITE_OTHER): Payer: Medicare Other | Admitting: *Deleted

## 2013-10-12 DIAGNOSIS — I2699 Other pulmonary embolism without acute cor pulmonale: Secondary | ICD-10-CM

## 2013-10-12 DIAGNOSIS — Z23 Encounter for immunization: Secondary | ICD-10-CM | POA: Diagnosis not present

## 2013-10-12 LAB — POCT INR: INR: 3.5

## 2013-10-13 ENCOUNTER — Telehealth: Payer: Self-pay | Admitting: Cardiology

## 2013-10-13 NOTE — Telephone Encounter (Signed)
Left message to call back  

## 2013-10-13 NOTE — Telephone Encounter (Signed)
New problem   Pt need to speak to nurse concerning heart rate going up after exercise and need to know if that is normal. Please advise pt.

## 2013-10-13 NOTE — Telephone Encounter (Signed)
Follow up     Returning [name]'s call

## 2013-10-14 NOTE — Telephone Encounter (Signed)
Spoke with patient and she states heart rate goes up and down with exercise. Patient states during exercise she does not do continuous strenuous activity, but does do about 30 minutes altogether. Patient meeting with trainer once a week on Wednesday. She will see how she does next week and call back with update.

## 2013-10-26 ENCOUNTER — Ambulatory Visit (INDEPENDENT_AMBULATORY_CARE_PROVIDER_SITE_OTHER): Payer: Medicare Other | Admitting: Pharmacist Clinician (PhC)/ Clinical Pharmacy Specialist

## 2013-10-26 DIAGNOSIS — I2699 Other pulmonary embolism without acute cor pulmonale: Secondary | ICD-10-CM

## 2013-10-26 LAB — POCT INR: INR: 2.2

## 2013-11-01 ENCOUNTER — Encounter: Payer: Self-pay | Admitting: Podiatry

## 2013-11-01 ENCOUNTER — Ambulatory Visit (INDEPENDENT_AMBULATORY_CARE_PROVIDER_SITE_OTHER): Payer: Medicare Other | Admitting: Podiatry

## 2013-11-01 VITALS — BP 149/81 | HR 97 | Resp 18

## 2013-11-01 DIAGNOSIS — L84 Corns and callosities: Secondary | ICD-10-CM

## 2013-11-01 NOTE — Patient Instructions (Signed)
Continue to wear the surgical shoe on the left foot

## 2013-11-02 NOTE — Progress Notes (Signed)
Patient ID: [name], female   DOB: 1934/09/28, 79 y.o.   MRN: [id number]  Subjective: This patient presents for ongoing pre-ulcerative / ulcerative traumatic skin ulcer on the plantar aspect of the left foot. Her husband is present  Objective: Hemorrhagic callus plantar left first MPJ that remains closed after debridement  Assessment: Pre-ulcerative callus plantar left first MPJ  Plan: Debrided keratoses Maintain surgical shoe Plastizote insole on left foot  Reappoint x1 month

## 2013-11-04 ENCOUNTER — Ambulatory Visit
Admission: RE | Admit: 2013-11-04 | Discharge: 2013-11-04 | Disposition: A | Discharge status: Home / Self Care | Payer: Medicare Other | Source: Ambulatory Visit

## 2013-11-04 DIAGNOSIS — Z1231 Encounter for screening mammogram for malignant neoplasm of breast: Secondary | ICD-10-CM

## 2013-11-08 DIAGNOSIS — N3020 Other chronic cystitis without hematuria: Secondary | ICD-10-CM | POA: Diagnosis not present

## 2013-11-16 DIAGNOSIS — M19072 Primary osteoarthritis, left ankle and foot: Secondary | ICD-10-CM | POA: Diagnosis not present

## 2013-11-23 ENCOUNTER — Ambulatory Visit (INDEPENDENT_AMBULATORY_CARE_PROVIDER_SITE_OTHER): Payer: Medicare Other

## 2013-11-23 DIAGNOSIS — I2699 Other pulmonary embolism without acute cor pulmonale: Secondary | ICD-10-CM | POA: Diagnosis not present

## 2013-11-23 LAB — POCT INR: INR: 2.6

## 2013-11-29 ENCOUNTER — Ambulatory Visit (INDEPENDENT_AMBULATORY_CARE_PROVIDER_SITE_OTHER): Payer: Medicare Other | Admitting: Podiatry

## 2013-11-29 ENCOUNTER — Encounter: Payer: Self-pay | Admitting: Podiatry

## 2013-11-29 VITALS — BP 147/72 | HR 85 | Temp 97.7°F | Resp 16 | Ht 62.0 in | Wt 232.0 lb

## 2013-11-29 DIAGNOSIS — L84 Corns and callosities: Secondary | ICD-10-CM | POA: Diagnosis not present

## 2013-11-29 NOTE — Progress Notes (Signed)
   Subjective:    Patient ID: [name], female    DOB: 08-08-1934, 79 y.o.   MRN: [id number]  HPI Comments: N ulcer L left 1st MPJ plantar D 11/27/2013 - darkened beneath the ulcer     11/28/2013 - blood tunneling to 2nd left toe plantar C change in the callous A unknown T padding off the area  This patient has a history of a traumatic skin ulcer on the left foot. Currently patient is wearing surgical shoe Plastizote insole and is seen at approximately 4 weeks intervals. She does have some custom foot orthotics which is not currently wearing.   Review of Systems  All other systems reviewed and are negative.      Objective:   Physical Exam  Orientated x3 white female presents with her husband  Dermatological: The plantar left first MPJ has a hyperkeratotic tissue with some hemorrhagic debris. Standing from the plantar left first MPJ callus there is a linear area of dried blood that extends to the base of the second sulcus area without any erythema, edema, warmth or drainage       Assessment & Plan:   Assessment: Pre-ulcerative bleeding callus plantar left first MPJ with associated shear causing some dry blood extending from the plantar left first MPJ ulcer. No evidence of infection in the plantar first left MPJ pre-ulcerative callus area.  Plan: Debridement L. plantar keratoses of first MPJ Apply Vaseline to the plantar first MPJ and attach protective foam pad, and secure with Coflex tape Continue wearing Darco shoe with Plastizote insole  Continue home care including daily application of Vaseline, foam pad to the plantar first MPJ area Maintain surgical shoe left  Next visit bring in existing foot orthotics for evaluation  Reappoint x1 month

## 2013-11-29 NOTE — Patient Instructions (Signed)
Apply foam pad around callused area of left foot Use Coflex tape around callused bad Use old candle to wax Coflex tape to reduce friction to the callused area Continue to use surgical shoe on left foot

## 2013-11-30 ENCOUNTER — Encounter: Payer: Self-pay | Admitting: Podiatry

## 2013-12-02 DIAGNOSIS — M19072 Primary osteoarthritis, left ankle and foot: Secondary | ICD-10-CM | POA: Diagnosis not present

## 2013-12-02 DIAGNOSIS — I10 Essential (primary) hypertension: Secondary | ICD-10-CM | POA: Diagnosis not present

## 2013-12-02 DIAGNOSIS — M5136 Other intervertebral disc degeneration, lumbar region: Secondary | ICD-10-CM | POA: Diagnosis not present

## 2013-12-08 DIAGNOSIS — M545 Low back pain: Secondary | ICD-10-CM | POA: Diagnosis not present

## 2013-12-15 DIAGNOSIS — M5416 Radiculopathy, lumbar region: Secondary | ICD-10-CM | POA: Diagnosis not present

## 2013-12-15 DIAGNOSIS — M961 Postlaminectomy syndrome, not elsewhere classified: Secondary | ICD-10-CM | POA: Diagnosis not present

## 2013-12-20 ENCOUNTER — Telehealth: Payer: Self-pay | Admitting: Cardiology

## 2013-12-20 NOTE — Telephone Encounter (Signed)
Patient would like to wait until back Friday

## 2013-12-20 NOTE — Telephone Encounter (Signed)
Patient needing Epidural Having Physical Therapy, starting 12/27/13 Would like  Dr. Mare Ferrari to review and give recommendations

## 2013-12-20 NOTE — Telephone Encounter (Signed)
New Message  Pt called to discuss coming off of her blood thinner and the Bystolic for 5 days prior procedure with Dr. Nelva Bush at [facility] orthopedic. Pt states there was supposed to be an order faxed over. Pt requests a call back to discuss.

## 2013-12-23 ENCOUNTER — Ambulatory Visit: Payer: Medicare Other | Admitting: Cardiology

## 2013-12-23 ENCOUNTER — Ambulatory Visit (INDEPENDENT_AMBULATORY_CARE_PROVIDER_SITE_OTHER): Payer: Medicare Other | Admitting: *Deleted

## 2013-12-23 DIAGNOSIS — I4891 Unspecified atrial fibrillation: Secondary | ICD-10-CM

## 2013-12-23 DIAGNOSIS — I2699 Other pulmonary embolism without acute cor pulmonale: Secondary | ICD-10-CM | POA: Diagnosis not present

## 2013-12-23 LAB — POCT INR: INR: 2

## 2013-12-24 DIAGNOSIS — M19072 Primary osteoarthritis, left ankle and foot: Secondary | ICD-10-CM | POA: Diagnosis not present

## 2013-12-24 DIAGNOSIS — M79672 Pain in left foot: Secondary | ICD-10-CM | POA: Diagnosis not present

## 2013-12-24 NOTE — Telephone Encounter (Signed)
If the physical therapy is not effective, I would recommend proceeding with the epidural.  If she has the epidural, she should have Lovenox bridging through our Coumadin clinic

## 2013-12-24 NOTE — Telephone Encounter (Signed)
Will forward to  Dr. Mare Ferrari for recommendations

## 2013-12-24 NOTE — Telephone Encounter (Signed)
Advised patient

## 2013-12-27 DIAGNOSIS — M5416 Radiculopathy, lumbar region: Secondary | ICD-10-CM | POA: Diagnosis not present

## 2013-12-31 DIAGNOSIS — Z961 Presence of intraocular lens: Secondary | ICD-10-CM | POA: Diagnosis not present

## 2013-12-31 DIAGNOSIS — H40053 Ocular hypertension, bilateral: Secondary | ICD-10-CM | POA: Diagnosis not present

## 2013-12-31 DIAGNOSIS — M5416 Radiculopathy, lumbar region: Secondary | ICD-10-CM | POA: Diagnosis not present

## 2014-01-03 ENCOUNTER — Encounter: Payer: Self-pay | Admitting: Podiatry

## 2014-01-03 ENCOUNTER — Ambulatory Visit (INDEPENDENT_AMBULATORY_CARE_PROVIDER_SITE_OTHER): Payer: Medicare Other | Admitting: Podiatry

## 2014-01-03 VITALS — BP 128/85 | HR 79 | Temp 96.9°F | Resp 16

## 2014-01-03 DIAGNOSIS — D649 Anemia, unspecified: Secondary | ICD-10-CM | POA: Diagnosis not present

## 2014-01-03 DIAGNOSIS — L84 Corns and callosities: Secondary | ICD-10-CM

## 2014-01-03 DIAGNOSIS — Z0000 Encounter for general adult medical examination without abnormal findings: Secondary | ICD-10-CM | POA: Diagnosis not present

## 2014-01-03 DIAGNOSIS — M5416 Radiculopathy, lumbar region: Secondary | ICD-10-CM | POA: Diagnosis not present

## 2014-01-03 DIAGNOSIS — Z79899 Other long term (current) drug therapy: Secondary | ICD-10-CM | POA: Diagnosis not present

## 2014-01-03 NOTE — Patient Instructions (Signed)
Wear surgical shoe with Plastizote insole half the day and athletic shoe with custom insole half the day Observe the pressure callus daily and if any drainage is noted apply Silvadene cream and wear the surgical shoe on a continuous basis

## 2014-01-04 DIAGNOSIS — N390 Urinary tract infection, site not specified: Secondary | ICD-10-CM | POA: Diagnosis not present

## 2014-01-04 NOTE — Progress Notes (Signed)
Patient ID: [name], female   DOB: 04/12/1934, 79 y.o.   MRN: [id number]  Subjective: This patient presents for ongoing care for pre-ulcerative in ulcerative plantar keratoses on the plantar left first MPJ. Currently patient has a history of a reoccurring superficial ulcer in this area for at least a year, currently t is wearing  in a surgical shoe with a thick Plastizote insole and applying a protective pad around the left first MPJ  Objective: Orientated 89 female presents with her husband  The plantar left first MPJ has hemorrhagic callus that remains closed after debridement. There is no erythema, edema, warmth, malodor, drainage noted in this area Patient has a custom insole with a pocket offload the left first MPJ to be worn and a athletic style shoe  Assessment: Pre-ulcerative plantar callus left first MPJ  Plan: Debride plantar keratoses left Add additional felt pad to existing custom insole to further deepen the plantar left first MPJ  Patient will begin transitioning to athletic shoe with custom insole wearing approximately half the time and surgical shoe half the time.  If any drainage is noted from the skin lesion on the left first MPJ DC athletic style shoe and wear surgical shoe with Plastizote insole and apply Silvadene cream to area.  Reappoint 2 weeks to evaluate patient's ability to tolerate non-surgical shoe (no breakdown into a skin ulcer) on a half-time basis.

## 2014-01-05 DIAGNOSIS — M545 Low back pain: Secondary | ICD-10-CM | POA: Diagnosis not present

## 2014-01-05 DIAGNOSIS — F329 Major depressive disorder, single episode, unspecified: Secondary | ICD-10-CM | POA: Diagnosis not present

## 2014-01-08 ENCOUNTER — Other Ambulatory Visit: Payer: Self-pay | Admitting: Cardiology

## 2014-01-10 ENCOUNTER — Other Ambulatory Visit: Payer: Self-pay | Admitting: Surgery

## 2014-01-10 DIAGNOSIS — M5416 Radiculopathy, lumbar region: Secondary | ICD-10-CM | POA: Diagnosis not present

## 2014-01-10 MED ORDER — WARFARIN SODIUM 2.5 MG PO TABS: ORAL_TABLET | ORAL | Status: DC

## 2014-01-13 DIAGNOSIS — M5136 Other intervertebral disc degeneration, lumbar region: Secondary | ICD-10-CM | POA: Diagnosis not present

## 2014-01-13 DIAGNOSIS — M5416 Radiculopathy, lumbar region: Secondary | ICD-10-CM | POA: Diagnosis not present

## 2014-01-17 ENCOUNTER — Ambulatory Visit (INDEPENDENT_AMBULATORY_CARE_PROVIDER_SITE_OTHER): Payer: Medicare Other | Admitting: Podiatry

## 2014-01-17 ENCOUNTER — Encounter: Payer: Self-pay | Admitting: Podiatry

## 2014-01-17 VITALS — BP 146/81 | HR 102 | Temp 97.0°F | Resp 14

## 2014-01-17 DIAGNOSIS — L84 Corns and callosities: Secondary | ICD-10-CM | POA: Diagnosis not present

## 2014-01-17 DIAGNOSIS — M5416 Radiculopathy, lumbar region: Secondary | ICD-10-CM | POA: Diagnosis not present

## 2014-01-17 NOTE — Patient Instructions (Addendum)
Continue to wear surgical shoe and athletic style shoe with custom insole. Okay to increase wearing time of athletic shoe with custom insole

## 2014-01-18 DIAGNOSIS — F419 Anxiety disorder, unspecified: Secondary | ICD-10-CM | POA: Diagnosis not present

## 2014-01-18 NOTE — Progress Notes (Signed)
Patient ID: [name], female   DOB: 1934-07-30, 79 y.o.   MRN: [id number]   Subjective: This patient presents for ongoing care for pre-ulcerative in ulcerative plantar keratoses on the plantar left first MPJ. Currently patient has a history of a reoccurring superficial ulcer in this area for at least a year, currently t is wearing in a surgical shoe with a thick Plastizote insole and applying a protective pad around the left first MPJ. She also wears athletic style shoe with custom insoles on a limited basis.  Objective: Orientated 52 female presents with her husband  The plantar left first MPJ has hemorrhagic callus that remains closed after debridement. There is no erythema, edema, warmth, malodor, drainage noted in this area Patient has a custom insole with a pocket offload the left first MPJ to be worn and a athletic style shoe  Non-dystrophic toenails 6-10  Assessment: Pre-ulcerative plantar callus left first MPJ  Plan: Debrided pre-ulcerative plantar callus left first MPJ  Patient will continue to wear modified surgical shoe with Plastizote insole and athletic shoe with custom insole increasing the wearing time to the athletic style shoe with custom insole.  Debrided toenails 10 without a bleeding  Reappoint 3 weeks

## 2014-01-24 DIAGNOSIS — M5416 Radiculopathy, lumbar region: Secondary | ICD-10-CM | POA: Diagnosis not present

## 2014-01-25 ENCOUNTER — Ambulatory Visit (INDEPENDENT_AMBULATORY_CARE_PROVIDER_SITE_OTHER): Payer: Medicare Other

## 2014-01-25 DIAGNOSIS — I2699 Other pulmonary embolism without acute cor pulmonale: Secondary | ICD-10-CM

## 2014-01-25 DIAGNOSIS — I4891 Unspecified atrial fibrillation: Secondary | ICD-10-CM

## 2014-01-25 LAB — POCT INR: INR: 2

## 2014-01-27 DIAGNOSIS — M5416 Radiculopathy, lumbar region: Secondary | ICD-10-CM | POA: Diagnosis not present

## 2014-01-28 DIAGNOSIS — M159 Polyosteoarthritis, unspecified: Secondary | ICD-10-CM | POA: Diagnosis not present

## 2014-01-28 DIAGNOSIS — M109 Gout, unspecified: Secondary | ICD-10-CM | POA: Diagnosis not present

## 2014-01-28 DIAGNOSIS — M797 Fibromyalgia: Secondary | ICD-10-CM | POA: Diagnosis not present

## 2014-01-31 DIAGNOSIS — M5416 Radiculopathy, lumbar region: Secondary | ICD-10-CM | POA: Diagnosis not present

## 2014-02-02 DIAGNOSIS — M5416 Radiculopathy, lumbar region: Secondary | ICD-10-CM | POA: Diagnosis not present

## 2014-02-07 ENCOUNTER — Encounter: Payer: Self-pay | Admitting: Cardiology

## 2014-02-07 ENCOUNTER — Ambulatory Visit (INDEPENDENT_AMBULATORY_CARE_PROVIDER_SITE_OTHER): Payer: Medicare Other | Admitting: Cardiology

## 2014-02-07 VITALS — BP 124/72 | HR 95 | Ht 62.0 in | Wt 241.0 lb

## 2014-02-07 DIAGNOSIS — I10 Essential (primary) hypertension: Secondary | ICD-10-CM | POA: Diagnosis not present

## 2014-02-07 DIAGNOSIS — I5032 Chronic diastolic (congestive) heart failure: Secondary | ICD-10-CM

## 2014-02-07 DIAGNOSIS — I4891 Unspecified atrial fibrillation: Secondary | ICD-10-CM

## 2014-02-07 DIAGNOSIS — I6529 Occlusion and stenosis of unspecified carotid artery: Secondary | ICD-10-CM | POA: Diagnosis not present

## 2014-02-07 DIAGNOSIS — M5416 Radiculopathy, lumbar region: Secondary | ICD-10-CM | POA: Diagnosis not present

## 2014-02-07 NOTE — Progress Notes (Signed)
[name] Date of Birth:  [birth year] [facility] [address] [place], [place]  [address] [phone]        Fax   [phone]   History of Present Illness: This pleasant 79 year old woman is seen for followup office visit.  She was admitted to the hospital in February 2015 because of new onset atrial fibrillation. She was already on long-term Coumadin. Diltiazem for rate control was added to her regimen in the hospital. The patient has a past history of pulmonary emboli and is on long-term Coumadin. Her INR has been stable. She has a history of essential hypertension and a history of right carotid artery occlusion. She has a past history of pulmonary emboli in January 2011 after right hip surgery and at that time venous Dopplers of the legs were negative for a source of embolization and she has remained on long-term Coumadin. 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 Donnetta Hutching. She does not have any significant coronary disease and she had cardiac catheterization in 1994 showing only mild plaque and she had a nuclear stress test in December 2011 showing no ischemia and her ejection fraction was 78%. Earlier this year she had left total shoulder replacement surgery by Dr. Onnie Graham. In October she saw pulmonary because of exertional dyspnea. She had a chest x-ray on 11/24/12 showing cardiomegaly. She had a VQ scan which was negative for new pulmonary emboli. The patient had an echocardiogram on 05/25/12 which showed an ejection fraction of 55-60% and no significant valve abnormalities. 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 This occurred over the Thanksgiving holiday. Her surgery had to be delayed a week in order to allow her supratherapeutic INR to correct itself. Her vision is still not back to baseline in the left eye. She anticipates  that she will need to have cataract surgery later this spring. Since we last saw her she has had ongoing problems with fluctuating blood pressure elevation as well as severe weakness and lack of energy and feeling that her legs were encased in cement when she tried to walk.  She had a buccal smear done at her PCP office for DNA.  She states that the DNA results showed that she did not metabolize metoprolol well.  Her metoprolol was stopped and she was switched to nebivolol and she has felt much better.  She continues to have rapid response to her atrial fibrillation.  Her heart rate has improved however since we increased the dose of bystolic up to 10 mg daily. Recently Dr. Shelia Media tried her on half of a 25 mg Zoloft.  She did not tolerate it.   Current Outpatient Prescriptions  Medication Sig Dispense Refill  . acetaminophen (TYLENOL) 500 MG tablet Take 500 mg by mouth daily as needed for headache.    . allopurinol (ZYLOPRIM) 300 MG tablet Take 300 mg by mouth Daily.     [name] Kitchen amLODipine (NORVASC) 5 MG tablet Take 1 tablet (5 mg total) by mouth daily. 30 tablet 6  . calcium citrate-vitamin D (CITRACAL+D) 315-200 MG-UNIT per tablet Take 1 tablet by mouth daily.    . cefUROXime (CEFTIN) 250 MG tablet Take 250 mg by mouth daily.     . CELEBREX 200 MG capsule Take 200 mg by mouth Daily.    . cetirizine (ZYRTEC) 10  MG tablet Take 10 mg by mouth daily.    . cholecalciferol (VITAMIN D) 1000 UNITS tablet Take 4,000 Units by mouth daily.     [name] Kitchen diltiazem (CARDIZEM CD) 120 MG 24 hr capsule Take 120 mg by mouth as needed.     . diltiazem (CARDIZEM) 60 MG tablet Take 60 mg by mouth every 6 (six) hours as needed. For symptomatic afib    . diphenhydrAMINE (BENADRYL) 25 mg capsule Take 25 mg by mouth at bedtime as needed for sleep. For sleep    . dorzolamide-timolol (COSOPT) 22.3-6.8 MG/ML ophthalmic solution Place 1 drop into both eyes 2 (two) times daily.     . fenofibrate 160 MG tablet Take 160 mg by mouth every  evening.     . furosemide (LASIX) 40 MG tablet Take 0.5 tablets (20 mg total) by mouth daily as needed. 30 tablet 1  . HYDROcodone-acetaminophen (NORCO) 10-325 MG per tablet Take 1 tablet by mouth every 6 (six) hours as needed.     . hydrocortisone 2.5 % cream     . indapamide (LOZOL) 1.25 MG tablet Take 1.25 mg by mouth every morning.    [name] Kitchen ketoconazole (NIZORAL) 2 % cream Apply 1 application topically 2 (two) times daily as needed for irritation.     . Magnesium 400 MG CAPS Take 400 mg by mouth daily.     . Multiple Vitamins-Minerals (MULTIVITAMIN WITH MINERALS) tablet Take 1 tablet by mouth daily.    . nebivolol (BYSTOLIC) 10 MG tablet Take 1 tablet (10 mg total) by mouth daily. 30 tablet 6  . nitrofurantoin (MACRODANTIN) 50 MG capsule     . omeprazole (PRILOSEC) 40 MG capsule Take 40 mg by mouth daily with supper.     . sertraline (ZOLOFT) 50 MG tablet     . vitamin C (ASCORBIC ACID) 500 MG tablet Take 500 mg by mouth daily.    [name] Kitchen warfarin (COUMADIN) 2.5 MG tablet TAKE (1) TABLET AS DIRECTED. 35 tablet 3  . [DISCONTINUED] amitriptyline (ELAVIL) 10 MG tablet Take 10 mg by mouth at bedtime.      No current facility-administered medications for this visit.    Allergies  Allergen Reactions  . Ciprofloxacin     RASH  . Codeine     NAUSEA  . Cymbalta [Duloxetine Hcl]     Nausea VOMITING AND ABDOMINAL PAIN, HEADACHE, JUST ABOUT EVERY SIDE EFFECT THE DRUG HAS  . Diltiazem Cd [Diltiazem Hcl Er Beads]     Heavy legs, cough  . Metoprolol     Legs like cement  . Nortriptyline     Nightmares  . Penicillins     RASH  & ITCHING   --PT STATES SHE CAN TAKE KEFLEX PO AND IV CEPHALOSPORINS  . Statins     Leg cramps  . Tetanus Toxoids     SWELLING, REDNESS  WHOLE ARM  . Lyrica [Pregabalin] Diarrhea, Nausea And Vomiting and Rash    Patient Active Problem List   Diagnosis Date Noted  . Aftercare following surgery of the circulatory system, [place] 09/15/2013  . Essential hypertension 06/17/2013    . Atrial fibrillation with RVR 04/01/2013  . Morbid obesity 04/01/2013  . OSA on CPAP 04/01/2013  . Chronic diastolic CHF (congestive heart failure)   . H/o recurrent pulmonary embolism, on Coumadin 05/24/2012  . Acute on chronic diastolic congestive heart failure 05/24/2012  . Normocytic anemia 05/24/2012  . Thrombocytopenia 05/24/2012  . Inadequate anticoagulation 05/24/2012  . Elevated brain natriuretic peptide (BNP) level 05/24/2012  .  Shoulder arthritis 05/15/2012  . Occlusion and stenosis of carotid artery without mention of cerebral infarction 09/10/2011  . OA (osteoarthritis) of hip 06/17/2011  . Osteoarthritis 01/08/2011  . Fibromyalgia 08/06/2010  . Dyslipidemia 08/06/2010  . Gout 08/06/2010  . Right carotid artery occlusion 08/06/2010    History  Smoking status  . Former Smoker -- 0.50 packs/day for 15 years  . Types: Cigarettes  . Quit date: 02/11/1986  Smokeless tobacco  . Never Used    History  Alcohol Use  . Yes    Comment: 05/14/2012 "have 2-3 drinks/yr" maybe    Family History  Problem Relation Age of Onset  . Heart disease Father     Heart Disease before age 56  . Heart attack Father 46  . Hypertension Father   . Peripheral vascular disease Father     Right  leg amputation  . Heart disease Mother     AAA  . Stroke Mother   . Aneurysm Mother   . Hypertension Mother   . Hypertension Brother   . Heart disease Brother     Heart Disease before age 85  . Hyperlipidemia Brother   . Heart attack Brother   . Hypertension Brother   . Diabetes Paternal Grandfather   . Diabetes Son     Review of Systems: Constitutional: no fever chills diaphoresis or fatigue or change in weight.  Head and neck: no hearing loss, no epistaxis, no photophobia or visual disturbance. Respiratory: No cough, shortness of breath or wheezing. Cardiovascular: No chest pain peripheral edema, palpitations. Gastrointestinal: No abdominal distention, no abdominal pain, no change  in bowel habits hematochezia or melena. Genitourinary: No dysuria, no frequency, no urgency, no nocturia. Musculoskeletal:No arthralgias, no back pain, no gait disturbance or myalgias. Neurological: No dizziness, no headaches, no numbness, no seizures, no syncope, no weakness, no tremors. Hematologic: No lymphadenopathy, no easy bruising. Psychiatric: No confusion, no hallucinations, no sleep disturbance.    Physical Exam: Filed Vitals:   02/07/14 1339  BP: 124/72  Pulse: 95   the general appearance reveals an overweight middle-aged woman in no acute distress.The head and neck exam reveals pupils equal and reactive.  Extraocular movements are full.  There is no scleral icterus.  The mouth and pharynx are normal.  The neck is supple.  The carotids reveal no bruits.  The jugular venous pressure is normal.  The  thyroid is not enlarged.  There is no lymphadenopathy.  The chest is clear to percussion and auscultation.  There are no rales or rhonchi.  Expansion of the chest is symmetrical.  The pulse is irregularly irregular. The precordium is quiet.  The first heart sound is normal.  The second heart sound is physiologically split.  There is no murmur gallop rub or click.  There is no abnormal lift or heave.  The abdomen is soft and nontender.  The bowel sounds are normal.  The liver and spleen are not enlarged.  There are no abdominal masses.  There are no abdominal bruits.  Extremities reveal good pedal pulses.  There is no phlebitis and there is trace ankle edema.  There is no cyanosis or clubbing.  Strength is normal and symmetrical in all extremities.  There is no lateralizing weakness.  There are no sensory deficits.  The skin is warm and dry.  There is no rash.   EKG shows atrial fibrillation with controlled ventricular response.  Heart rate is 95.  Since last tracing of 09/21/13, the heart rate is slower.  Assessment / Plan: 1. paroxysmal atrial fibrillation on long-term anticoagulation.   Doing well on rate control and long-term anticoagulation. 2. prior pulmonary emboli, on warfarin 3. essential hypertension with intolerance to metoprolol. 4. Chronic diastolic CHF  Plan: Continue current medication.  Recheck in 3 months for office visit.  Encouraged her to continue the physical therapy that she is receiving for her arthritis and her left foot pain.  She states that if she fails to improve with physical therapy, she may need to have fusion of some of the bones of her left foot.

## 2014-02-07 NOTE — Patient Instructions (Signed)
Your physician recommends that you continue on your current medications as directed. Please refer to the Current Medication list given to you today.  Your physician recommends that you schedule a follow-up appointment in: 3 month ov

## 2014-02-09 ENCOUNTER — Encounter: Payer: Self-pay | Admitting: Podiatry

## 2014-02-09 ENCOUNTER — Ambulatory Visit (INDEPENDENT_AMBULATORY_CARE_PROVIDER_SITE_OTHER): Payer: Medicare Other | Admitting: Podiatry

## 2014-02-09 VITALS — BP 153/77 | HR 94 | Resp 12

## 2014-02-09 DIAGNOSIS — M5416 Radiculopathy, lumbar region: Secondary | ICD-10-CM | POA: Diagnosis not present

## 2014-02-09 DIAGNOSIS — L84 Corns and callosities: Secondary | ICD-10-CM | POA: Diagnosis not present

## 2014-02-09 NOTE — Patient Instructions (Signed)
Apply topical antibiotic ointment to blister days of left toe daily, cover with gauze Okay to wear athletic shoe with custom insole on the left foot

## 2014-02-09 NOTE — Progress Notes (Signed)
Patient ID: [name], female   DOB: 04-13-1934, 79 y.o.   MRN: [id number]  Subjective: This patient presents for ongoing pre-ulcerative/ ulcerative plantar keratoses plantar first MPJ. She has a history of approximately 1 year for reoccurring ulcer in the site.  Objective: Hemorrhagic plantar keratoses sub-first left MPJ with adjacent small blister. There is no erythema, edema, warmth surrounding the site.  Assessment: Pre-ulcerative callus with adjacent blood blister  Plan: Debrided pre-ulcerative callus and puncture adjacent blister releasing a small amount of bloody drainage. An antibiotic dressing was applied.  Patient will continue to apply topical antibiotic ointment and cover with gauze. She will wear her custom foot orthotic with a pocket accommodation offload the left first MPJ  Reappoint 3 weeks or sooner if patient has concern

## 2014-02-10 DIAGNOSIS — M19072 Primary osteoarthritis, left ankle and foot: Secondary | ICD-10-CM | POA: Diagnosis not present

## 2014-02-10 DIAGNOSIS — M79672 Pain in left foot: Secondary | ICD-10-CM | POA: Diagnosis not present

## 2014-02-12 ENCOUNTER — Emergency Department (HOSPITAL_COMMUNITY)
Admission: EM | Admit: 2014-02-12 | Discharge: 2014-02-12 | Disposition: A | Discharge status: Home / Self Care | Payer: Medicare Other | Attending: Emergency Medicine | Admitting: Emergency Medicine

## 2014-02-12 ENCOUNTER — Encounter (HOSPITAL_COMMUNITY): Payer: Self-pay | Admitting: Emergency Medicine

## 2014-02-12 ENCOUNTER — Emergency Department (HOSPITAL_COMMUNITY): Payer: Medicare Other

## 2014-02-12 DIAGNOSIS — Z880 Allergy status to penicillin: Secondary | ICD-10-CM | POA: Insufficient documentation

## 2014-02-12 DIAGNOSIS — E876 Hypokalemia: Secondary | ICD-10-CM | POA: Insufficient documentation

## 2014-02-12 DIAGNOSIS — Z9981 Dependence on supplemental oxygen: Secondary | ICD-10-CM | POA: Diagnosis not present

## 2014-02-12 DIAGNOSIS — Z86711 Personal history of pulmonary embolism: Secondary | ICD-10-CM | POA: Diagnosis not present

## 2014-02-12 DIAGNOSIS — M109 Gout, unspecified: Secondary | ICD-10-CM | POA: Diagnosis not present

## 2014-02-12 DIAGNOSIS — Z9889 Other specified postprocedural states: Secondary | ICD-10-CM | POA: Insufficient documentation

## 2014-02-12 DIAGNOSIS — M797 Fibromyalgia: Secondary | ICD-10-CM | POA: Diagnosis not present

## 2014-02-12 DIAGNOSIS — I517 Cardiomegaly: Secondary | ICD-10-CM | POA: Diagnosis not present

## 2014-02-12 DIAGNOSIS — Z792 Long term (current) use of antibiotics: Secondary | ICD-10-CM | POA: Diagnosis not present

## 2014-02-12 DIAGNOSIS — R0602 Shortness of breath: Secondary | ICD-10-CM | POA: Diagnosis not present

## 2014-02-12 DIAGNOSIS — Z79899 Other long term (current) drug therapy: Secondary | ICD-10-CM | POA: Insufficient documentation

## 2014-02-12 DIAGNOSIS — M1612 Unilateral primary osteoarthritis, left hip: Secondary | ICD-10-CM | POA: Insufficient documentation

## 2014-02-12 DIAGNOSIS — K219 Gastro-esophageal reflux disease without esophagitis: Secondary | ICD-10-CM | POA: Insufficient documentation

## 2014-02-12 DIAGNOSIS — M19011 Primary osteoarthritis, right shoulder: Secondary | ICD-10-CM | POA: Insufficient documentation

## 2014-02-12 DIAGNOSIS — G4733 Obstructive sleep apnea (adult) (pediatric): Secondary | ICD-10-CM | POA: Diagnosis not present

## 2014-02-12 DIAGNOSIS — I5032 Chronic diastolic (congestive) heart failure: Secondary | ICD-10-CM | POA: Insufficient documentation

## 2014-02-12 DIAGNOSIS — I482 Chronic atrial fibrillation, unspecified: Secondary | ICD-10-CM

## 2014-02-12 DIAGNOSIS — R069 Unspecified abnormalities of breathing: Secondary | ICD-10-CM | POA: Diagnosis not present

## 2014-02-12 DIAGNOSIS — M19012 Primary osteoarthritis, left shoulder: Secondary | ICD-10-CM | POA: Diagnosis not present

## 2014-02-12 DIAGNOSIS — R0989 Other specified symptoms and signs involving the circulatory and respiratory systems: Secondary | ICD-10-CM | POA: Diagnosis not present

## 2014-02-12 DIAGNOSIS — Z87891 Personal history of nicotine dependence: Secondary | ICD-10-CM | POA: Insufficient documentation

## 2014-02-12 DIAGNOSIS — Z7901 Long term (current) use of anticoagulants: Secondary | ICD-10-CM | POA: Diagnosis not present

## 2014-02-12 DIAGNOSIS — Z791 Long term (current) use of non-steroidal anti-inflammatories (NSAID): Secondary | ICD-10-CM | POA: Insufficient documentation

## 2014-02-12 DIAGNOSIS — Z87440 Personal history of urinary (tract) infections: Secondary | ICD-10-CM | POA: Insufficient documentation

## 2014-02-12 LAB — CBC WITH DIFFERENTIAL/PLATELET
BASOS ABS: 0 10*3/uL (ref 0.0–0.1)
Basophils Relative: 0 % (ref 0–1)
EOS PCT: 2 % (ref 0–5)
Eosinophils Absolute: 0.2 10*3/uL (ref 0.0–0.7)
HEMATOCRIT: 40.1 % (ref 36.0–46.0)
Hemoglobin: 12.9 g/dL (ref 12.0–15.0)
LYMPHS PCT: 16 % (ref 12–46)
Lymphs Abs: 1.2 10*3/uL (ref 0.7–4.0)
MCH: 28 pg (ref 26.0–34.0)
MCHC: 32.2 g/dL (ref 30.0–36.0)
MCV: 87.2 fL (ref 78.0–100.0)
MONO ABS: 0.6 10*3/uL (ref 0.1–1.0)
Monocytes Relative: 8 % (ref 3–12)
Neutro Abs: 5.6 10*3/uL (ref 1.7–7.7)
Neutrophils Relative %: 74 % (ref 43–77)
Platelets: 131 10*3/uL — ABNORMAL LOW (ref 150–400)
RBC: 4.6 MIL/uL (ref 3.87–5.11)
RDW: 17.2 % — ABNORMAL HIGH (ref 11.5–15.5)
WBC: 7.6 10*3/uL (ref 4.0–10.5)

## 2014-02-12 LAB — PROTIME-INR
INR: 1.6 — AB (ref 0.00–1.49)
Prothrombin Time: 19.2 seconds — ABNORMAL HIGH (ref 11.6–15.2)

## 2014-02-12 LAB — I-STAT CHEM 8, ED
BUN: 13 mg/dL (ref 6–23)
CHLORIDE: 98 meq/L (ref 96–112)
Calcium, Ion: 1.07 mmol/L — ABNORMAL LOW (ref 1.13–1.30)
Creatinine, Ser: 0.7 mg/dL (ref 0.50–1.10)
Glucose, Bld: 124 mg/dL — ABNORMAL HIGH (ref 70–99)
HEMATOCRIT: 38 % (ref 36.0–46.0)
HEMOGLOBIN: 12.9 g/dL (ref 12.0–15.0)
Potassium: 2.9 mmol/L — ABNORMAL LOW (ref 3.5–5.1)
Sodium: 143 mmol/L (ref 135–145)
TCO2: 26 mmol/L (ref 0–100)

## 2014-02-12 LAB — I-STAT TROPONIN, ED: Troponin i, poc: 0 ng/mL (ref 0.00–0.08)

## 2014-02-12 MED ORDER — ONDANSETRON HCL 4 MG/2ML IJ SOLN
4.0000 mg | Freq: Once | INTRAMUSCULAR | Status: AC
  Administered 2014-02-12: 4 mg via INTRAVENOUS
  Filled 2014-02-12: qty 2

## 2014-02-12 MED ORDER — HYDROCODONE-ACETAMINOPHEN 5-325 MG PO TABS
2.0000 | ORAL_TABLET | Freq: Once | ORAL | Status: AC
  Administered 2014-02-12: 2 via ORAL
  Filled 2014-02-12: qty 2

## 2014-02-12 NOTE — ED Notes (Signed)
Patient transported to X-ray 

## 2014-02-12 NOTE — ED Notes (Signed)
Pt placed on 2L [place], after O2 sat drops to 85% on RA.

## 2014-02-12 NOTE — ED Notes (Signed)
Pt O2 sat drops to 82% on RA while in bed and goes up to 88% ambulating on RA, pt states she doesn't want to stay on the hospital and that she has a Cipap at home that will help with the O2 sat. MD notified.

## 2014-02-12 NOTE — ED Notes (Signed)
SOB mild at all times; yesterday started with increased SOB with exertion. Cannot walk from one room to another without stopping due to SOB. Took a prn dose of Lasix 20 mg this morning with output every 30 minutes from 0700--1400. SOB has improved, but still more than usual. Reports back pain between shoulder blades, chest pain earlier but none at this time. Denies nausea or diaphoresis. AFib on monitor which is her usual rhythm.

## 2014-02-12 NOTE — ED Provider Notes (Signed)
CSN: [id number]     Arrival date & time 02/12/14  1529 History   First MD Initiated Contact with Patient 02/12/14 1558     Chief Complaint  Patient presents with  . Shortness of Breath      HPI SOB mild at all times; yesterday started with increased SOB with exertion. Cannot walk from one room to another without stopping due to SOB. Took a prn dose of Lasix 20 mg this morning with output every 30 minutes from 0700--1400. SOB has improved, but still more than usual. Reports back pain between shoulder blades, chest pain earlier but none at this time. Denies nausea or diaphoresis. AFib on monitor which is her usual rhythm. Past Medical History  Diagnosis Date  . Hypertension   . Gout   . Tinnitus   . Acid reflux disease   . Pulmonary embolism 2011    a. Hx of PE in 02/2009 after R hip surgery, venous dopplers negative, long-term Coumadin.  . Vertigo   . Crohn disease   . Long term current use of anticoagulant   . GERD (gastroesophageal reflux disease)   . Recurrent upper respiratory infection (URI)     BRONCHITIS FEB 2013--SLIGHT COUGH NON-PRODUCTIVE NOW  . H/O hiatal hernia   . PVC (premature ventricular contraction)     PT STATES HX OF PVC'S ON EKG  . Peripheral vascular disease     KNOWN RIGHT INTERNAL CAROTID ARTERY OCCLUSION (NO STROKE)  --40 TO 59% STENOSIS LEFT ICA-FOLLOWED BY DR. EARLY WITH DOPPLER STUDY EVERY 6 MONTHS  . Fibromyalgia   . High triglycerides   . Obstructive sleep apnea on CPAP   . Crohn's disease   . History of stomach ulcers 1950's  . Osteoarthritis     PAIN AND OA LEF T HIP AND BOTH SHOULDERS ARE BONE ON BONE AND PAINFUL  . Recurrent UTI (urinary tract infection)     "on daily medicine" (05/14/2012)  . Carotid artery occlusion   . Detached retina   . Chronic diastolic CHF (congestive heart failure)     Hypertensive heart disease  . Atrial fibrillation    Past Surgical History  Procedure Laterality Date  . Femur fracture surgery Right 02/21/09    Open  reduction internal fixation of right periprosthetic  femur fracture utilizing Zimmer cables times fiv  . Hammer toe surgery Left 10/25/08    "toe next to big to" (05/14/2012)  . Back surgery  10/29/06    Central and foraminal decompression L3-L4, L4-L5, and L5-S1 with inspection of L4-L5 and L5-S1 disc on the right  . Knee arthroscopy Right 07/26/05  . Total hip arthroplasty Right 12/08/02    Osteonics total hip replacement  . Excision morton's neuroma Right 05/20/00    "foot" (05/14/2012)  . Total hip arthroplasty  06/17/2011    Procedure: TOTAL HIP ARTHROPLASTY;  Surgeon: Gearlean Alf, MD;  Location: [facility];  Service: Orthopedics;  Laterality: Left;  . Joint replacement  2009    Right Hip replacement  . Joint replacement  2012    Right knee replacement  . Joint replacement  06/17/11    Left Hip replacement  . Fracture surgery  2011    Right Femur Fx  . Total shoulder arthroplasty Left 05/14/2012  . Tonsillectomy and adenoidectomy  1958?  [name] Kitchen Appendectomy  1950's  . Cholecystectomy  ~ 1988  . Vaginal hysterectomy  1971  . Dilation and curettage of uterus      "several; from miscarriages" (05/14/2012)  . Decompressive  lumbar laminectomy level 3  ~ 2002  . Replacement total knee Right 02/19/2010  . Cardiac catheterization  1970's    "maybe 2" (05/14/2012)  . Cardiac catheterization    . Cataract extraction w/ intraocular lens implant Right 2009  . Total shoulder arthroplasty Left 05/14/2012    Procedure: TOTAL SHOULDER ARTHROPLASTY;  Surgeon: Marin Shutter, MD;  Location: [place];  Service: Orthopedics;  Laterality: Left;  [name] Kitchen Eye surgery Left 2014    Detached retina  . Eye surgery Left August 23, 2013    Cataract  . Spine surgery     Family History  Problem Relation Age of Onset  . Heart disease Father     Heart Disease before age 27  . Heart attack Father 17  . Hypertension Father   . Peripheral vascular disease Father     Right  leg amputation  . Heart disease Mother     AAA  . Stroke  Mother   . Aneurysm Mother   . Hypertension Mother   . Hypertension Brother   . Heart disease Brother     Heart Disease before age 72  . Hyperlipidemia Brother   . Heart attack Brother   . Hypertension Brother   . Diabetes Paternal Grandfather   . Diabetes Son    History  Substance Use Topics  . Smoking status: Former Smoker -- 0.50 packs/day for 15 years    Types: Cigarettes    Quit date: 02/11/1986  . Smokeless tobacco: Never Used  . Alcohol Use: Yes     Comment: 05/14/2012 "have 2-3 drinks/yr" maybe   OB History    No data available     Review of Systems    Allergies  Ciprofloxacin; Codeine; Cymbalta; Diltiazem cd; Metoprolol; Nortriptyline; Penicillins; Statins; Tetanus toxoids; and Lyrica  Home Medications   Prior to Admission medications   Medication Sig Start Date End Date Taking? Authorizing Provider  acetaminophen (TYLENOL) 500 MG tablet Take 500 mg by mouth daily as needed for headache.   Yes Historical Provider, MD  allopurinol (ZYLOPRIM) 300 MG tablet Take 300 mg by mouth Daily.  05/10/10  Yes Historical Provider, MD  amLODipine (NORVASC) 5 MG tablet Take 1 tablet (5 mg total) by mouth daily. 08/09/13  Yes Darlin Coco, MD  calcium citrate-vitamin D (CITRACAL+D) 315-200 MG-UNIT per tablet Take 1 tablet by mouth daily.   Yes Historical Provider, MD  CELEBREX 200 MG capsule Take 200 mg by mouth Daily. 08/24/11  Yes Historical Provider, MD  cetirizine (ZYRTEC) 10 MG tablet Take 10 mg by mouth daily.   Yes Historical Provider, MD  cholecalciferol (VITAMIN D) 1000 UNITS tablet Take 4,000 Units by mouth daily.    Yes Historical Provider, MD  diltiazem (CARDIZEM) 60 MG tablet Take 60 mg by mouth every 6 (six) hours as needed (symptomatic AFIB).  05/05/13  Yes Darlin Coco, MD  dorzolamide-timolol (COSOPT) 22.3-6.8 MG/ML ophthalmic solution Place 1 drop into both eyes 2 (two) times daily.  06/28/13  Yes Historical Provider, MD  fenofibrate 160 MG tablet Take 160 mg  by mouth every evening.  05/10/10  Yes Historical Provider, MD  furosemide (LASIX) 40 MG tablet Take 0.5 tablets (20 mg total) by mouth daily as needed.   Yes Darlin Coco, MD  HYDROcodone-acetaminophen (NORCO) 10-325 MG per tablet Take 1 tablet by mouth every 6 (six) hours as needed (pain).  01/28/14  Yes Historical Provider, MD  indapamide (LOZOL) 1.25 MG tablet Take 1.25 mg by mouth every morning.  Yes Historical Provider, MD  ketoconazole (NIZORAL) 2 % cream Apply 1 application topically 2 (two) times daily as needed for irritation.  09/26/11  Yes Historical Provider, MD  Magnesium 400 MG CAPS Take 400 mg by mouth daily.  02/19/11  Yes Historical Provider, MD  Multiple Vitamins-Minerals (MULTIVITAMIN WITH MINERALS) tablet Take 1 tablet by mouth daily.   Yes Historical Provider, MD  nebivolol (BYSTOLIC) 10 MG tablet Take 1 tablet (10 mg total) by mouth daily. 09/24/13  Yes Darlin Coco, MD  nitrofurantoin (MACRODANTIN) 50 MG capsule Take 50 mg by mouth daily.  02/02/14  Yes Historical Provider, MD  omeprazole (PRILOSEC) 40 MG capsule Take 40 mg by mouth daily with supper.  06/08/10  Yes Historical Provider, MD  vitamin C (ASCORBIC ACID) 500 MG tablet Take 500 mg by mouth daily.   Yes Historical Provider, MD  warfarin (COUMADIN) 2.5 MG tablet Take 1.25-2.5 mg by mouth daily. Tuesday and Saturday take 1/2 tablet all other days take 1 tablet   Yes Historical Provider, MD  diphenhydrAMINE (BENADRYL) 25 mg capsule Take 25 mg by mouth at bedtime as needed for sleep. For sleep    Historical Provider, MD  warfarin (COUMADIN) 2.5 MG tablet TAKE (1) TABLET AS DIRECTED. Patient not taking: Reported on 02/12/2014 01/10/14   Darlin Coco, MD   BP 155/98 mmHg  Pulse 115  Temp(Src) 98 F (36.7 C) (Oral)  Resp 16  SpO2 86% Physical Exam  Constitutional: She is oriented to person, place, and time. She appears well-developed and well-nourished. No distress.  HENT:  Head: Normocephalic and atraumatic.   Eyes: Pupils are equal, round, and reactive to light.  Neck: Normal range of motion.  Cardiovascular: Normal rate and intact distal pulses.   Pulmonary/Chest: No respiratory distress. She has no wheezes. She has no rales.  Abdominal: Normal appearance. She exhibits no distension.  Musculoskeletal: Normal range of motion.  Neurological: She is alert and oriented to person, place, and time. No cranial nerve deficit.  Skin: Skin is warm and dry. No rash noted.  Psychiatric: She has a normal mood and affect. Her behavior is normal.  Nursing note and vitals reviewed.   ED Course  Procedures (including critical care time)  After treatment in the ED the patient feels back to baseline and wants to go home.  Patient states that she feels the best she felt while walking in the last 2 days.  She will return if her symptoms worsen.  At the present time she is talking full sentences her husband says she looks the best she looked in 2 days and they both want to go home.     Labs Review Labs Reviewed  CBC WITH DIFFERENTIAL - Abnormal; Notable for the following:    RDW 17.2 (*)    Platelets 131 (*)    All other components within normal limits  PROTIME-INR - Abnormal; Notable for the following:    Prothrombin Time 19.2 (*)    INR 1.60 (*)    All other components within normal limits  I-STAT CHEM 8, ED - Abnormal; Notable for the following:    Potassium 2.9 (*)    Glucose, Bld 124 (*)    Calcium, Ion 1.07 (*)    All other components within normal limits  BRAIN NATRIURETIC PEPTIDE  I-STAT TROPOININ, ED    Imaging Review Dg Chest 2 View  02/12/2014   CLINICAL DATA:  Progressive chronic shortness of breath.  Wheezing.  EXAM: CHEST  2 VIEW  COMPARISON:  07/07/2013  FINDINGS: There is cardiomegaly with slight distention of the azygos vein. Pulmonary vascularity is slightly prominent. No consolidative infiltrates or effusions. No acute osseous abnormality.  IMPRESSION: Cardiomegaly with slight  pulmonary vascular congestion.   Electronically Signed   By: Rozetta Nunnery M.D.   On: 02/12/2014 16:36     Date: 02/12/2014  Rate: 102  Rhythm: Atrial fibrillation  QRS Axis: Right axis deviation  Intervals: normal  ST/T Wave abnormalities: T wave inversion which appears to be old when compared to previous EKGs  Conduction Disutrbances: none  Narrative Interpretation: Abnormal EKG which is similar to previous tracings except for increase in ventricular rate      MDM     3  Dot Lanes, MD 02/12/14 2152

## 2014-02-12 NOTE — ED Notes (Signed)
Pt insist to be with out O2 [place], pt oriented about her O2 saturation dropping and the need of having enough O2 supplied on her body, pt still don't want to wear her [place], MD notified.

## 2014-02-12 NOTE — ED Notes (Signed)
Pt reported that she felt better than she had ever felt walking. Pt did not feel short of breath felt a little dizzy upon sitting but said that was normal for her. Pt pulse ox dropped to 83 but came back up to 88 while ambulating. Pt heart rate stayed around 115.

## 2014-02-12 NOTE — ED Notes (Signed)
Patient requests Norco for chronic foot pain that she would usually be taking at home at this time.

## 2014-02-12 NOTE — ED Notes (Signed)
Dr Audie Pinto given a copy of Chem 8 results

## 2014-02-12 NOTE — Discharge Instructions (Signed)
Take your potassium tonight and twice daily.  Take Lasix(40 mg tomorrow).      Heart Failure Heart failure is a condition in which the heart has trouble pumping blood. This means your heart does not pump blood efficiently for your body to work well. In some cases of heart failure, fluid may back up into your lungs or you may have swelling (edema) in your lower legs. Heart failure is usually a long-term (chronic) condition. It is important for you to take good care of yourself and follow your health care provider's treatment plan. CAUSES  Some health conditions can cause heart failure. Those health conditions include:  High blood pressure (hypertension). Hypertension causes the heart muscle to work harder than normal. When pressure in the blood vessels is high, the heart needs to pump (contract) with more force in order to circulate blood throughout the body. High blood pressure eventually causes the heart to become stiff and weak.  Coronary artery disease (CAD). CAD is the buildup of cholesterol and fat (plaque) in the arteries of the heart. The blockage in the arteries deprives the heart muscle of oxygen and blood. This can cause chest pain and may lead to a heart attack. High blood pressure can also contribute to CAD.  Heart attack (myocardial infarction). A heart attack occurs when one or more arteries in the heart become blocked. The loss of oxygen damages the muscle tissue of the heart. When this happens, part of the heart muscle dies. The injured tissue does not contract as well and weakens the heart's ability to pump blood.  Abnormal heart valves. When the heart valves do not open and close properly, it can cause heart failure. This makes the heart muscle pump harder to keep the blood flowing.  Heart muscle disease (cardiomyopathy or myocarditis). Heart muscle disease is damage to the heart muscle from a variety of causes. These can include drug or alcohol abuse, infections, or unknown  reasons. These can increase the risk of heart failure.  Lung disease. Lung disease makes the heart work harder because the lungs do not work properly. This can cause a strain on the heart, leading it to fail.  Diabetes. Diabetes increases the risk of heart failure. High blood sugar contributes to high fat (lipid) levels in the blood. Diabetes can also cause slow damage to tiny blood vessels that carry important nutrients to the heart muscle. When the heart does not get enough oxygen and food, it can cause the heart to become weak and stiff. This leads to a heart that does not contract efficiently.  Other conditions can contribute to heart failure. These include abnormal heart rhythms, thyroid problems, and low blood counts (anemia). Certain unhealthy behaviors can increase the risk of heart failure, including:  Being overweight.  Smoking or chewing tobacco.  Eating foods high in fat and cholesterol.  Abusing illicit drugs or alcohol.  Lacking physical activity. SYMPTOMS  Heart failure symptoms may vary and can be hard to detect. Symptoms may include:  Shortness of breath with activity, such as climbing stairs.  Persistent cough.  Swelling of the feet, ankles, legs, or abdomen.  Unexplained weight gain.  Difficulty breathing when lying flat (orthopnea).  Waking from sleep because of the need to sit up and get more air.  Rapid heartbeat.  Fatigue and loss of energy.  Feeling light-headed, dizzy, or close to fainting.  Loss of appetite.  Nausea.  Increased urination during the night (nocturia). DIAGNOSIS  A diagnosis of heart failure is based  on your history, symptoms, physical examination, and diagnostic tests. Diagnostic tests for heart failure may include:  Echocardiography.  Electrocardiography.  Chest X-ray.  Blood tests.  Exercise stress test.  Cardiac angiography.  Radionuclide scans. TREATMENT  Treatment is aimed at managing the symptoms of heart  failure. Medicines, behavioral changes, or surgical intervention may be necessary to treat heart failure.  Medicines to help treat heart failure may include:  Angiotensin-converting enzyme (ACE) inhibitors. This type of medicine blocks the effects of a blood protein called angiotensin-converting enzyme. ACE inhibitors relax (dilate) the blood vessels and help lower blood pressure.  Angiotensin receptor blockers (ARBs). This type of medicine blocks the actions of a blood protein called angiotensin. Angiotensin receptor blockers dilate the blood vessels and help lower blood pressure.  Water pills (diuretics). Diuretics cause the kidneys to remove salt and water from the blood. The extra fluid is removed through urination. This loss of extra fluid lowers the volume of blood the heart pumps.  Beta blockers. These prevent the heart from beating too fast and improve heart muscle strength.  Digitalis. This increases the force of the heartbeat.  Healthy behavior changes include:  Obtaining and maintaining a healthy weight.  Stopping smoking or chewing tobacco.  Eating heart-healthy foods.  Limiting or avoiding alcohol.  Stopping illicit drug use.  Physical activity as directed by your health care provider.  Surgical treatment for heart failure may include:  A procedure to open blocked arteries, repair damaged heart valves, or remove damaged heart muscle tissue.  A pacemaker to improve heart muscle function and control certain abnormal heart rhythms.  An internal cardioverter defibrillator to treat certain serious abnormal heart rhythms.  A left ventricular assist device (LVAD) to assist the pumping ability of the heart. HOME CARE INSTRUCTIONS   Take medicines only as directed by your health care provider. Medicines are important in reducing the workload of your heart, slowing the progression of heart failure, and improving your symptoms.  Do not stop taking your medicine unless  directed by your health care provider.  Do not skip any dose of medicine.  Refill your prescriptions before you run out of medicine. Your medicines are needed every day.  Engage in moderate physical activity if directed by your health care provider. Moderate physical activity can benefit some people. The elderly and people with severe heart failure should consult with a health care provider for physical activity recommendations.  Eat heart-healthy foods. Food choices should be free of trans fat and low in saturated fat, cholesterol, and salt (sodium). Healthy choices include fresh or frozen fruits and vegetables, fish, lean meats, legumes, fat-free or low-fat dairy products, and whole grain or high fiber foods. Talk to a dietitian to learn more about heart-healthy foods.  Limit sodium if directed by your health care provider. Sodium restriction may reduce symptoms of heart failure in some people. Talk to a dietitian to learn more about heart-healthy seasonings.  Use healthy cooking methods. Healthy cooking methods include roasting, grilling, broiling, baking, poaching, steaming, or stir-frying. Talk to a dietitian to learn more about healthy cooking methods.  Limit fluids if directed by your health care provider. Fluid restriction may reduce symptoms of heart failure in some people.  Weigh yourself every day. Daily weights are important in the early recognition of excess fluid. You should weigh yourself every morning after you urinate and before you eat breakfast. Wear the same amount of clothing each time you weigh yourself. Record your daily weight. Provide your health care provider  with your weight record.  Monitor and record your blood pressure if directed by your health care provider.  Check your pulse if directed by your health care provider.  Lose weight if directed by your health care provider. Weight loss may reduce symptoms of heart failure in some people.  Stop smoking or chewing  tobacco. Nicotine makes your heart work harder by causing your blood vessels to constrict. Do not use nicotine gum or patches before talking to your health care provider.  Keep all follow-up visits as directed by your health care provider. This is important.  Limit alcohol intake to no more than 1 drink per day for nonpregnant women and 2 drinks per day for men. One drink equals 12 ounces of beer, 5 ounces of wine, or 1 ounces of hard liquor. Drinking more than that is harmful to your heart. Tell your health care provider if you drink alcohol several times a week. Talk with your health care provider about whether alcohol is safe for you. If your heart has already been damaged by alcohol or you have severe heart failure, drinking alcohol should be stopped completely.  Stop illicit drug use.  Stay up-to-date with immunizations. It is especially important to prevent respiratory infections through current pneumococcal and influenza immunizations.  Manage other health conditions such as hypertension, diabetes, thyroid disease, or abnormal heart rhythms as directed by your health care provider.  Learn to manage stress.  Plan rest periods when fatigued.  Learn strategies to manage high temperatures. If the weather is extremely hot:  Avoid vigorous physical activity.  Use air conditioning or fans or seek a cooler location.  Avoid caffeine and alcohol.  Wear loose-fitting, lightweight, and light-colored clothing.  Learn strategies to manage cold temperatures. If the weather is extremely cold:  Avoid vigorous physical activity.  Layer clothes.  Wear mittens or gloves, a hat, and a scarf when going outside.  Avoid alcohol.  Obtain ongoing education and support as needed.  Participate in or seek rehabilitation as needed to maintain or improve independence and quality of life. SEEK MEDICAL CARE IF:   Your weight increases by 03 lb/1.4 kg in 1 day or 05 lb/2.3 kg in a week.  You have  increasing shortness of breath that is unusual for you.  You are unable to participate in your usual physical activities.  You tire easily.  You cough more than normal, especially with physical activity.  You have any or more swelling in areas such as your hands, feet, ankles, or abdomen.  You are unable to sleep because it is hard to breathe.  You feel like your heart is beating fast (palpitations).  You become dizzy or light-headed upon standing up. SEEK IMMEDIATE MEDICAL CARE IF:   You have difficulty breathing.  There is a change in mental status such as decreased alertness or difficulty with concentration.  You have a pain or discomfort in your chest.  You have an episode of fainting (syncope). MAKE SURE YOU:   Understand these instructions.  Will watch your condition.  Will get help right away if you are not doing well or get worse. Document Released: 01/28/2005 Document Revised: 06/14/2013 Document Reviewed: 02/28/2012 [facility] Patient Information 2015 [place], [place]. This information is not intended to replace advice given to you by your health care provider. Make sure you discuss any questions you have with your health care provider.  Hypokalemia Hypokalemia means that the amount of potassium in the blood is lower than normal.Potassium is a chemical, called  an electrolyte, that helps regulate the amount of fluid in the body. It also stimulates muscle contraction and helps nerves function properly.Most of the body's potassium is inside of cells, and only a very small amount is in the blood. Because the amount in the blood is so small, minor changes can be life-threatening. CAUSES  Antibiotics.  Diarrhea or vomiting.  Using laxatives too much, which can cause diarrhea.  Chronic kidney disease.  Water pills (diuretics).  Eating disorders (bulimia).  Low magnesium level.  Sweating a lot. SIGNS AND  SYMPTOMS  Weakness.  Constipation.  Fatigue.  Muscle cramps.  Mental confusion.  Skipped heartbeats or irregular heartbeat (palpitations).  Tingling or numbness. DIAGNOSIS  Your health care provider can diagnose hypokalemia with blood tests. In addition to checking your potassium level, your health care provider may also check other lab tests. TREATMENT Hypokalemia can be treated with potassium supplements taken by mouth or adjustments in your current medicines. If your potassium level is very low, you may need to get potassium through a vein (IV) and be monitored in the hospital. A diet high in potassium is also helpful. Foods high in potassium are:  Nuts, such as peanuts and pistachios.  Seeds, such as sunflower seeds and pumpkin seeds.  Peas, lentils, and lima beans.  Whole grain and bran cereals and breads.  Fresh fruit and vegetables, such as apricots, avocado, bananas, cantaloupe, kiwi, oranges, tomatoes, asparagus, and potatoes.  Orange and tomato juices.  Red meats.  Fruit yogurt. HOME CARE INSTRUCTIONS  Take all medicines as prescribed by your health care provider.  Maintain a healthy diet by including nutritious food, such as fruits, vegetables, nuts, whole grains, and lean meats.  If you are taking a laxative, be sure to follow the directions on the label. SEEK MEDICAL CARE IF:  Your weakness gets worse.  You feel your heart pounding or racing.  You are vomiting or having diarrhea.  You are diabetic and having trouble keeping your blood glucose in the normal range. SEEK IMMEDIATE MEDICAL CARE IF:  You have chest pain, shortness of breath, or dizziness.  You are vomiting or having diarrhea for more than 2 days.  You faint. MAKE SURE YOU:   Understand these instructions.  Will watch your condition.  Will get help right away if you are not doing well or get worse. Document Released: 01/28/2005 Document Revised: 11/18/2012 Document Reviewed:  07/31/2012 [facility] Patient Information 2015 [place], [place]. This information is not intended to replace advice given to you by your health care provider. Make sure you discuss any questions you have with your health care provider.  Potassium Content of Foods Potassium is a mineral found in many foods and drinks. It helps keep fluids and minerals balanced in your body and affects how steadily your heart beats. Potassium also helps control your blood pressure and keep your muscles and nervous system healthy. Certain health conditions and medicines may change the balance of potassium in your body. When this happens, you can help balance your level of potassium through the foods that you do or do not eat. Your health care provider or dietitian may recommend an amount of potassium that you should have each day. The following lists of foods provide the amount of potassium (in parentheses) per serving in each item. HIGH IN POTASSIUM  The following foods and beverages have 200 mg or more of potassium per serving:  Apricots, 2 raw or 5 dry (200 mg).  Artichoke, 1 medium (345 mg).  Avocado,  raw,  each (245 mg).  Banana, 1 medium (425 mg).  Beans, lima, or baked beans, canned,  cup (280 mg).  Beans, white, canned,  cup (595 mg).  Beef roast, 3 oz (320 mg).  Beef, ground, 3 oz (270 mg).  Beets, raw or cooked,  cup (260 mg).  Bran muffin, 2 oz (300 mg).  Broccoli,  cup (230 mg).  Brussels sprouts,  cup (250 mg).  Cantaloupe,  cup (215 mg).  Cereal, 100% bran,  cup (200-400 mg).  Cheeseburger, single, fast food, 1 each (225-400 mg).  Chicken, 3 oz (220 mg).  Clams, canned, 3 oz (535 mg).  Crab, 3 oz (225 mg).  Dates, 5 each (270 mg).  Dried beans and peas,  cup (300-475 mg).  Figs, dried, 2 each (260 mg).  Fish: halibut, tuna, cod, snapper, 3 oz (480 mg).  Fish: salmon, haddock, swordfish, perch, 3 oz (300 mg).  Fish, tuna, canned 3 oz (200 mg).  [place] fries,  fast food, 3 oz (470 mg).  Granola with fruit and nuts,  cup (200 mg).  Grapefruit juice,  cup (200 mg).  Greens, beet,  cup (655 mg).  Honeydew melon,  cup (200 mg).  Kale, raw, 1 cup (300 mg).  Kiwi, 1 medium (240 mg).  Kohlrabi, rutabaga, parsnips,  cup (280 mg).  Lentils,  cup (365 mg).  Mango, 1 each (325 mg).  Milk, chocolate, 1 cup (420 mg).  Milk: nonfat, low-fat, whole, buttermilk, 1 cup (350-380 mg).  Molasses, 1 Tbsp (295 mg).  Mushrooms,  cup (280) mg.  Nectarine, 1 each (275 mg).  Nuts: almonds, peanuts, hazelnuts, [place], cashew, mixed, 1 oz (200 mg).  Nuts, pistachios, 1 oz (295 mg).  Orange, 1 each (240 mg).  Orange juice,  cup (235 mg).  Papaya, medium,  fruit (390 mg).  Peanut butter, chunky, 2 Tbsp (240 mg).  Peanut butter, smooth, 2 Tbsp (210 mg).  Pear, 1 medium (200 mg).  Pomegranate, 1 whole (400 mg).  Pomegranate juice,  cup (215 mg).  Pork, 3 oz (350 mg).  Potato chips, salted, 1 oz (465 mg).  Potato, baked with skin, 1 medium (925 mg).  Potatoes, boiled,  cup (255 mg).  Potatoes, mashed,  cup (330 mg).  Prune juice,  cup (370 mg).  Prunes, 5 each (305 mg).  Pudding, chocolate,  cup (230 mg).  Pumpkin, canned,  cup (250 mg).  Raisins, seedless,  cup (270 mg).  Seeds, sunflower or pumpkin, 1 oz (240 mg).  Soy milk, 1 cup (300 mg).  Spinach,  cup (420 mg).  Spinach, canned,  cup (370 mg).  Sweet potato, baked with skin, 1 medium (450 mg).  Swiss chard,  cup (480 mg).  Tomato or vegetable juice,  cup (275 mg).  Tomato sauce or puree,  cup (400-550 mg).  Tomato, raw, 1 medium (290 mg).  Tomatoes, canned,  cup (200-300 mg).  [place], 3 oz (250 mg).  Wheat germ, 1 oz (250 mg).  Winter squash,  cup (250 mg).  Yogurt, plain or fruited, 6 oz (260-435 mg).  Zucchini,  cup (220 mg). MODERATE IN POTASSIUM The following foods and beverages have 50-200 mg of potassium per  serving:  Apple, 1 each (150 mg).  Apple juice,  cup (150 mg).  Applesauce,  cup (90 mg).  Apricot nectar,  cup (140 mg).  Asparagus, small spears,  cup or 6 spears (155 mg).  Bagel, cinnamon raisin, 1 each (130 mg).  Bagel, egg or plain, 4 in.,  1 each (70 mg).  Beans, green,  cup (90 mg).  Beans, yellow,  cup (190 mg).  Beer, regular, 12 oz (100 mg).  Beets, canned,  cup (125 mg).  Blackberries,  cup (115 mg).  Blueberries,  cup (60 mg).  Bread, whole wheat, 1 slice (70 mg).  Broccoli, raw,  cup (145 mg).  Cabbage,  cup (150 mg).  Carrots, cooked or raw,  cup (180 mg).  Cauliflower, raw,  cup (150 mg).  Celery, raw,  cup (155 mg).  Cereal, bran flakes, cup (120-150 mg).  Cheese, cottage,  cup (110 mg).  Cherries, 10 each (150 mg).  Chocolate, 1 oz bar (165 mg).  Coffee, brewed 6 oz (90 mg).  Corn,  cup or 1 ear (195 mg).  Cucumbers,  cup (80 mg).  Egg, large, 1 each (60 mg).  Eggplant,  cup (60 mg).  Endive, raw, cup (80 mg).  English muffin, 1 each (65 mg).  Fish, orange roughy, 3 oz (150 mg).  Frankfurter, beef or pork, 1 each (75 mg).  Fruit cocktail,  cup (115 mg).  Grape juice,  cup (170 mg).  Grapefruit,  fruit (175 mg).  Grapes,  cup (155 mg).  Greens: kale, turnip, collard,  cup (110-150 mg).  Ice cream or frozen yogurt, chocolate,  cup (175 mg).  Ice cream or frozen yogurt, vanilla,  cup (120-150 mg).  Lemons, limes, 1 each (80 mg).  Lettuce, all types, 1 cup (100 mg).  Mixed vegetables,  cup (150 mg).  Mushrooms, raw,  cup (110 mg).  Nuts: walnuts, pecans, or macadamia, 1 oz (125 mg).  Oatmeal,  cup (80 mg).  Okra,  cup (110 mg).  Onions, raw,  cup (120 mg).  Peach, 1 each (185 mg).  Peaches, canned,  cup (120 mg).  Pears, canned,  cup (120 mg).  Peas, green, frozen,  cup (90 mg).  Peppers, green,  cup (130 mg).  Peppers, red,  cup (160 mg).  Pineapple juice,   cup (165 mg).  Pineapple, fresh or canned,  cup (100 mg).  Plums, 1 each (105 mg).  Pudding, vanilla,  cup (150 mg).  Raspberries,  cup (90 mg).  Rhubarb,  cup (115 mg).  Rice, wild,  cup (80 mg).  Shrimp, 3 oz (155 mg).  Spinach, raw, 1 cup (170 mg).  Strawberries,  cup (125 mg).  Summer squash  cup (175-200 mg).  Swiss chard, raw, 1 cup (135 mg).  Tangerines, 1 each (140 mg).  Tea, brewed, 6 oz (65 mg).  Turnips,  cup (140 mg).  Watermelon,  cup (85 mg).  Wine, red, table, 5 oz (180 mg).  Wine, white, table, 5 oz (100 mg). LOW IN POTASSIUM The following foods and beverages have less than 50 mg of potassium per serving.  Bread, white, 1 slice (30 mg).  Carbonated beverages, 12 oz (less than 5 mg).  Cheese, 1 oz (20-30 mg).  Cranberries,  cup (45 mg).  Cranberry juice cocktail,  cup (20 mg).  Fats and oils, 1 Tbsp (less than 5 mg).  Hummus, 1 Tbsp (32 mg).  Nectar: papaya, mango, or pear,  cup (35 mg).  Rice, white or brown,  cup (50 mg).  Spaghetti or macaroni,  cup cooked (30 mg).  Tortilla, flour or corn, 1 each (50 mg).  Waffle, 4 in., 1 each (50 mg).  Water chestnuts,  cup (40 mg). Document Released: 09/11/2004 Document Revised: 02/02/2013 Document Reviewed: 12/25/2012 [facility] Patient Information 2015 [place], [place]. This information is  not intended to replace advice given to you by your health care provider. Make sure you discuss any questions you have with your health care provider.

## 2014-02-17 ENCOUNTER — Telehealth: Payer: Self-pay | Admitting: Cardiology

## 2014-02-17 DIAGNOSIS — E876 Hypokalemia: Secondary | ICD-10-CM

## 2014-02-17 NOTE — Telephone Encounter (Signed)
New Msg        Saturday pt went to ER with CHF. Pt would like to speak with nurse about this.    Please call.

## 2014-02-17 NOTE — Telephone Encounter (Signed)
Patient recently went to ED for CHF and has been taking Lasix 40 mg daily since then except today 20 mg. Did increase her K+ to 20 meq twice while on increased Lasix. States she is feeling much better and would like to just start taking Lasix 20 mg every other day if agreeable with  Dr. Mare Ferrari. Patient has follow up with Dr Shelia Media 03/03/14. Will forward to  Dr. Mare Ferrari for review

## 2014-02-17 NOTE — Telephone Encounter (Signed)
Discussed with  Dr. Mare Ferrari and will have patient decrease lasix to 20 mg daily and come for labs tomorrow. If she does ok on 20 mg for 1 week ok to decrease to Lasix 20 mg every other day and observe. Any swelling noted she needs to go back up to daily. Advised patient, verbalized understanding.

## 2014-02-18 ENCOUNTER — Other Ambulatory Visit (INDEPENDENT_AMBULATORY_CARE_PROVIDER_SITE_OTHER): Payer: Medicare Other | Admitting: *Deleted

## 2014-02-18 ENCOUNTER — Ambulatory Visit (INDEPENDENT_AMBULATORY_CARE_PROVIDER_SITE_OTHER): Payer: Medicare Other

## 2014-02-18 DIAGNOSIS — E876 Hypokalemia: Secondary | ICD-10-CM | POA: Diagnosis not present

## 2014-02-18 DIAGNOSIS — I2699 Other pulmonary embolism without acute cor pulmonale: Secondary | ICD-10-CM

## 2014-02-18 DIAGNOSIS — I4891 Unspecified atrial fibrillation: Secondary | ICD-10-CM | POA: Diagnosis not present

## 2014-02-18 LAB — BASIC METABOLIC PANEL
BUN: 25 mg/dL — ABNORMAL HIGH (ref 6–23)
CO2: 28 mEq/L (ref 19–32)
Calcium: 9.4 mg/dL (ref 8.4–10.5)
Chloride: 106 mEq/L (ref 96–112)
Creatinine, Ser: 1 mg/dL (ref 0.4–1.2)
GFR: 57.06 mL/min — AB (ref >60.00)
Glucose, Bld: 118 mg/dL — ABNORMAL HIGH (ref 70–99)
Potassium: 4.1 mEq/L (ref 3.5–5.1)
Sodium: 146 mEq/L — ABNORMAL HIGH (ref 135–145)

## 2014-02-18 LAB — POCT INR: INR: 1.8

## 2014-02-18 NOTE — Progress Notes (Signed)
Quick Note:  Please report to patient. The recent labs are stable. Continue same medication and careful diet. ______ 

## 2014-02-21 DIAGNOSIS — M19072 Primary osteoarthritis, left ankle and foot: Secondary | ICD-10-CM | POA: Diagnosis not present

## 2014-02-23 DIAGNOSIS — M19072 Primary osteoarthritis, left ankle and foot: Secondary | ICD-10-CM | POA: Diagnosis not present

## 2014-02-24 DIAGNOSIS — M5416 Radiculopathy, lumbar region: Secondary | ICD-10-CM | POA: Diagnosis not present

## 2014-02-24 DIAGNOSIS — M961 Postlaminectomy syndrome, not elsewhere classified: Secondary | ICD-10-CM | POA: Diagnosis not present

## 2014-02-24 DIAGNOSIS — M5136 Other intervertebral disc degeneration, lumbar region: Secondary | ICD-10-CM | POA: Diagnosis not present

## 2014-02-25 DIAGNOSIS — M19072 Primary osteoarthritis, left ankle and foot: Secondary | ICD-10-CM | POA: Diagnosis not present

## 2014-03-01 DIAGNOSIS — R1110 Vomiting, unspecified: Secondary | ICD-10-CM | POA: Diagnosis not present

## 2014-03-01 DIAGNOSIS — R110 Nausea: Secondary | ICD-10-CM | POA: Diagnosis not present

## 2014-03-02 ENCOUNTER — Ambulatory Visit: Payer: Medicare Other | Admitting: Podiatry

## 2014-03-03 DIAGNOSIS — R110 Nausea: Secondary | ICD-10-CM | POA: Diagnosis not present

## 2014-03-03 DIAGNOSIS — R1110 Vomiting, unspecified: Secondary | ICD-10-CM | POA: Diagnosis not present

## 2014-03-03 DIAGNOSIS — F411 Generalized anxiety disorder: Secondary | ICD-10-CM | POA: Diagnosis not present

## 2014-03-10 DIAGNOSIS — M79672 Pain in left foot: Secondary | ICD-10-CM | POA: Diagnosis not present

## 2014-03-10 DIAGNOSIS — M19072 Primary osteoarthritis, left ankle and foot: Secondary | ICD-10-CM | POA: Diagnosis not present

## 2014-03-11 ENCOUNTER — Ambulatory Visit (INDEPENDENT_AMBULATORY_CARE_PROVIDER_SITE_OTHER): Payer: Medicare Other | Admitting: Pharmacist

## 2014-03-11 DIAGNOSIS — I4891 Unspecified atrial fibrillation: Secondary | ICD-10-CM

## 2014-03-11 DIAGNOSIS — I2699 Other pulmonary embolism without acute cor pulmonale: Secondary | ICD-10-CM | POA: Diagnosis not present

## 2014-03-11 DIAGNOSIS — M19072 Primary osteoarthritis, left ankle and foot: Secondary | ICD-10-CM | POA: Diagnosis not present

## 2014-03-11 LAB — POCT INR: INR: 2

## 2014-03-14 ENCOUNTER — Encounter: Payer: Self-pay | Admitting: Podiatry

## 2014-03-14 ENCOUNTER — Ambulatory Visit (INDEPENDENT_AMBULATORY_CARE_PROVIDER_SITE_OTHER): Payer: Medicare Other | Admitting: Podiatry

## 2014-03-14 VITALS — BP 133/76 | HR 111 | Temp 98.6°F | Resp 14

## 2014-03-14 DIAGNOSIS — L84 Corns and callosities: Secondary | ICD-10-CM

## 2014-03-14 NOTE — Patient Instructions (Signed)
Apply Vaseline and light gauze dressing to the dry blood blister on the bottom of the left foot daily Okay to wear athletic style shoe with insole

## 2014-03-15 DIAGNOSIS — R112 Nausea with vomiting, unspecified: Secondary | ICD-10-CM | POA: Diagnosis not present

## 2014-03-15 DIAGNOSIS — R197 Diarrhea, unspecified: Secondary | ICD-10-CM | POA: Diagnosis not present

## 2014-03-15 DIAGNOSIS — E876 Hypokalemia: Secondary | ICD-10-CM | POA: Diagnosis not present

## 2014-03-15 NOTE — Progress Notes (Signed)
Patient ID: [name], female   DOB: 1934/05/06, 80 y.o.   MRN: [id number]  Subjective: This patient presents again for ongoing treatment for pre-ulcerative/ulcerative plantar keratoses on the left foot for approximately a year. She also states that she visit her former employer for arthritic left foot pain and is undergoing iontophoresis for this problem with minimal improvement.  Objective: Dry hemorrhagic keratoses plantar left first MPJ with an extension to the plantar left first intermetatarsal space. There is no erythema, edema, warmth or active drainage in this hemorrhagic hyperkeratotic area  Assessment: Pre-ulcerative callus plantar left without clinical sign of infection  Plan: Debrided plantar keratoses left Advised patient apply Vaseline and light gauze dressing to the plantar aspect left foot daily Allow patient return to athletic style shoe with custom soft insole  Reappoint 2 weeks

## 2014-03-18 DIAGNOSIS — R1084 Generalized abdominal pain: Secondary | ICD-10-CM | POA: Diagnosis not present

## 2014-03-18 DIAGNOSIS — R109 Unspecified abdominal pain: Secondary | ICD-10-CM | POA: Diagnosis not present

## 2014-03-24 DIAGNOSIS — M5416 Radiculopathy, lumbar region: Secondary | ICD-10-CM | POA: Diagnosis not present

## 2014-03-24 DIAGNOSIS — M5136 Other intervertebral disc degeneration, lumbar region: Secondary | ICD-10-CM | POA: Diagnosis not present

## 2014-03-24 DIAGNOSIS — M961 Postlaminectomy syndrome, not elsewhere classified: Secondary | ICD-10-CM | POA: Diagnosis not present

## 2014-03-25 ENCOUNTER — Encounter: Payer: Self-pay | Admitting: Family

## 2014-03-25 DIAGNOSIS — M19072 Primary osteoarthritis, left ankle and foot: Secondary | ICD-10-CM | POA: Diagnosis not present

## 2014-03-28 ENCOUNTER — Ambulatory Visit: Payer: Medicare Other | Admitting: Podiatry

## 2014-03-29 ENCOUNTER — Encounter: Payer: Self-pay | Admitting: Family

## 2014-03-29 ENCOUNTER — Ambulatory Visit (INDEPENDENT_AMBULATORY_CARE_PROVIDER_SITE_OTHER): Payer: Medicare Other | Admitting: Family

## 2014-03-29 ENCOUNTER — Ambulatory Visit (HOSPITAL_COMMUNITY)
Admission: RE | Admit: 2014-03-29 | Discharge: 2014-03-29 | Disposition: A | Discharge status: Home / Self Care | Payer: Medicare Other | Source: Ambulatory Visit | Attending: Family | Admitting: Family

## 2014-03-29 VITALS — BP 145/84 | HR 90 | Resp 16 | Ht 62.0 in | Wt 236.0 lb

## 2014-03-29 DIAGNOSIS — I6523 Occlusion and stenosis of bilateral carotid arteries: Secondary | ICD-10-CM | POA: Diagnosis not present

## 2014-03-29 DIAGNOSIS — I6521 Occlusion and stenosis of right carotid artery: Secondary | ICD-10-CM | POA: Diagnosis not present

## 2014-03-29 DIAGNOSIS — I6522 Occlusion and stenosis of left carotid artery: Secondary | ICD-10-CM | POA: Diagnosis not present

## 2014-03-29 DIAGNOSIS — M19072 Primary osteoarthritis, left ankle and foot: Secondary | ICD-10-CM | POA: Diagnosis not present

## 2014-03-29 DIAGNOSIS — Z48812 Encounter for surgical aftercare following surgery on the circulatory system: Secondary | ICD-10-CM

## 2014-03-29 NOTE — Progress Notes (Signed)
Established Carotid Patient   History of Present Illness  [name] is a 80 y.o. female patient of Dr. Donnetta Hutching seen for left-sided carotid stenosis with a known right ICA occlusion.  She returns today for follow up.  Patient has not had previous carotid artery intervention.  She denies any remote or recent stroke of TIA symptoms.  Denies steal symptoms.  Does not have claudication symptoms.  Tires easily since had the PE.  Has mild neuropathy in both feet for years, attributes to back issues.  She had detatched retina repaired Dec. 2, 2014, had cataract surgery July, 2015, can see much better, no more headaches. She has Crohn's Disease and is under evaluation for any occult GI bleed, is anemic, states her Hg is increasing with oral iron. April, 2014 she had a left total shoulder replaced, then developed PE's, she now takes coumadin.  She has had lumbar spine surgery.  She has severe generalized OA, has affected her feet, has trouble walking. She is getting physical therapy. Is also getting some treatments to her feet (OA pain) that helps with pain and is able to walk more.   She also has fibromyalgia. She states her blood pressure usually runs 124-132/ 68-72. She states she has proven white coat syndrome.  She had an exacerbation of her CHF January 2016, observed in ED, did not need to be admitted.   Pt Diabetic: No   Pt smoker: former smoker, quit 25 years ago  Pt meds include:  Statin : No: cannot tolerate  Betablocker: Yes  ASA: No  Other anticoagulants/antiplatelets: coumadin  Past Medical History  Diagnosis Date  . Hypertension   . Gout   . Tinnitus   . Acid reflux disease   . Pulmonary embolism 2011    a. Hx of PE in 02/2009 after R hip surgery, venous dopplers negative, long-term Coumadin.  . Vertigo   . Crohn disease   . Long term current use of anticoagulant   . GERD (gastroesophageal reflux disease)   . Recurrent upper respiratory infection  (URI)     BRONCHITIS FEB 2013--SLIGHT COUGH NON-PRODUCTIVE NOW  . H/O hiatal hernia   . PVC (premature ventricular contraction)     PT STATES HX OF PVC'S ON EKG  . Peripheral vascular disease     KNOWN RIGHT INTERNAL CAROTID ARTERY OCCLUSION (NO STROKE)  --40 TO 59% STENOSIS LEFT ICA-FOLLOWED BY DR. EARLY WITH DOPPLER STUDY EVERY 6 MONTHS  . Fibromyalgia   . High triglycerides   . Obstructive sleep apnea on CPAP   . Crohn's disease   . History of stomach ulcers 1950's  . Osteoarthritis     PAIN AND OA LEF T HIP AND BOTH SHOULDERS ARE BONE ON BONE AND PAINFUL  . Recurrent UTI (urinary tract infection)     "on daily medicine" (05/14/2012)  . Carotid artery occlusion   . Detached retina   . Atrial fibrillation   . Fibromyalgia   . Chronic diastolic CHF (congestive heart failure)     Hypertensive heart disease 02-12-14    Social History History  Substance Use Topics  . Smoking status: Former Smoker -- 0.50 packs/day for 15 years    Types: Cigarettes    Quit date: 02/11/1986  . Smokeless tobacco: Never Used  . Alcohol Use: Yes     Comment: 05/14/2012 "have 2-3 drinks/yr" maybe    Family History Family History  Problem Relation Age of Onset  . Heart disease Father     Heart Disease before  age 72  . Heart attack Father 90  . Hypertension Father   . Peripheral vascular disease Father     Right  leg amputation  . Heart disease Mother     AAA  . Stroke Mother   . Aneurysm Mother   . Hypertension Mother   . Hypertension Brother   . Heart disease Brother     Heart Disease before age 25  . Hyperlipidemia Brother   . Heart attack Brother   . Hypertension Brother   . Diabetes Paternal Grandfather   . Diabetes Son     Surgical History Past Surgical History  Procedure Laterality Date  . Femur fracture surgery Right 02/21/09    Open reduction internal fixation of right periprosthetic  femur fracture utilizing Zimmer cables times fiv  . Hammer toe surgery Left 10/25/08    "toe  next to big to" (05/14/2012)  . Back surgery  10/29/06    Central and foraminal decompression L3-L4, L4-L5, and L5-S1 with inspection of L4-L5 and L5-S1 disc on the right  . Knee arthroscopy Right 07/26/05  . Total hip arthroplasty Right 12/08/02    Osteonics total hip replacement  . Excision morton's neuroma Right 05/20/00    "foot" (05/14/2012)  . Total hip arthroplasty  06/17/2011    Procedure: TOTAL HIP ARTHROPLASTY;  Surgeon: Gearlean Alf, MD;  Location: [facility];  Service: Orthopedics;  Laterality: Left;  . Joint replacement  2009    Right Hip replacement  . Joint replacement  2012    Right knee replacement  . Joint replacement  06/17/11    Left Hip replacement  . Fracture surgery  2011    Right Femur Fx  . Total shoulder arthroplasty Left 05/14/2012  . Tonsillectomy and adenoidectomy  1958?  [name] Kitchen Appendectomy  1950's  . Cholecystectomy  ~ 1988  . Vaginal hysterectomy  1971  . Dilation and curettage of uterus      "several; from miscarriages" (05/14/2012)  . Decompressive lumbar laminectomy level 3  ~ 2002  . Replacement total knee Right 02/19/2010  . Cardiac catheterization  1970's    "maybe 2" (05/14/2012)  . Cardiac catheterization    . Cataract extraction w/ intraocular lens implant Right 2009  . Total shoulder arthroplasty Left 05/14/2012    Procedure: TOTAL SHOULDER ARTHROPLASTY;  Surgeon: Marin Shutter, MD;  Location: [facility];  Service: Orthopedics;  Laterality: Left;  [name] Kitchen Eye surgery Left 2014    Detached retina  . Eye surgery Left August 23, 2013    Cataract  . Spine surgery      Allergies  Allergen Reactions  . Ciprofloxacin     RASH  . Codeine     NAUSEA  . Cymbalta [Duloxetine Hcl]     Nausea VOMITING AND ABDOMINAL PAIN, HEADACHE, JUST ABOUT EVERY SIDE EFFECT THE DRUG HAS  . Diltiazem Cd [Diltiazem Hcl Er Beads]     Heavy legs, cough  . Metoprolol     Legs like cement  . Nortriptyline     Nightmares  . Penicillins     RASH  & ITCHING   --PT STATES SHE CAN TAKE KEFLEX PO  AND IV CEPHALOSPORINS  . Statins     Leg cramps  . Tetanus Toxoids     SWELLING, REDNESS  WHOLE ARM  . Lyrica [Pregabalin] Diarrhea, Nausea And Vomiting and Rash    Current Outpatient Prescriptions  Medication Sig Dispense Refill  . acetaminophen (TYLENOL) 500 MG tablet Take 500 mg by mouth daily as  needed for headache.    . allopurinol (ZYLOPRIM) 300 MG tablet Take 300 mg by mouth Daily.     [name] Kitchen amLODipine (NORVASC) 5 MG tablet Take 1 tablet (5 mg total) by mouth daily. 30 tablet 6  . calcium citrate-vitamin D (CITRACAL+D) 315-200 MG-UNIT per tablet Take 1 tablet by mouth daily.    . CELEBREX 200 MG capsule Take 200 mg by mouth Daily.    . cetirizine (ZYRTEC) 10 MG tablet Take 10 mg by mouth daily.    . cholecalciferol (VITAMIN D) 1000 UNITS tablet Take 4,000 Units by mouth daily.     [name] Kitchen diltiazem (CARDIZEM) 60 MG tablet Take 60 mg by mouth every 6 (six) hours as needed (symptomatic AFIB).     [name] Kitchen diphenhydrAMINE (BENADRYL) 25 mg capsule Take 25 mg by mouth at bedtime as needed for sleep. For sleep    . dorzolamide-timolol (COSOPT) 22.3-6.8 MG/ML ophthalmic solution Place 1 drop into both eyes 2 (two) times daily.     . fenofibrate 160 MG tablet Take 160 mg by mouth every evening.     . furosemide (LASIX) 40 MG tablet Take 0.5 tablets (20 mg total) by mouth daily as needed. 30 tablet 1  . HYDROcodone-acetaminophen (NORCO) 10-325 MG per tablet Take 1 tablet by mouth every 6 (six) hours as needed (pain).     . indapamide (LOZOL) 1.25 MG tablet Take 1.25 mg by mouth every morning.    [name] Kitchen ketoconazole (NIZORAL) 2 % cream Apply 1 application topically 2 (two) times daily as needed for irritation.     . Magnesium 400 MG CAPS Take 400 mg by mouth daily.     . Multiple Vitamins-Minerals (MULTIVITAMIN WITH MINERALS) tablet Take 1 tablet by mouth daily.    . nebivolol (BYSTOLIC) 10 MG tablet Take 1 tablet (10 mg total) by mouth daily. 30 tablet 6  . nitrofurantoin (MACRODANTIN) 50 MG capsule Take 50 mg  by mouth daily.     [name] Kitchen omeprazole (PRILOSEC) 40 MG capsule Take 40 mg by mouth daily with supper.     . vitamin C (ASCORBIC ACID) 500 MG tablet Take 500 mg by mouth daily.    [name] Kitchen warfarin (COUMADIN) 2.5 MG tablet TAKE (1) TABLET AS DIRECTED. 35 tablet 3  . warfarin (COUMADIN) 2.5 MG tablet Take 1.25-2.5 mg by mouth daily. Tuesday and Saturday take 1/2 tablet all other days take 1 tablet    . [DISCONTINUED] amitriptyline (ELAVIL) 10 MG tablet Take 10 mg by mouth at bedtime.      No current facility-administered medications for this visit.    Review of Systems : See HPI for pertinent positives and negatives.  Physical Examination  Filed Vitals:   03/29/14 1121 03/29/14 1127  BP: 135/65 145/84  Pulse: 90 90  Resp:  16  Height:  5' 2"  (1.575 m)  Weight:  236 lb (107.049 kg)  SpO2:  94%   Body mass index is 43.15 kg/(m^2).   General: WDWN morbidly obese female in NAD  GAIT: slow, deliberate, using cane Eyes: PERRLA  Pulmonary: CTAB, Negative Rales, Negative rhonchi, & Negative wheezing.  Cardiac: regular Rhythm, no detected murmur  VASCULAR EXAM  Carotid Bruits  Left  Right    Positive  Negative    Radial pulses are 2+ palpable and equal.   LE Pulses  LEFT  RIGHT   POPLITEAL  not palpable  not palpable    Gastrointestinal: soft, nontender, BS WNL, no r/g, no palpable masses.  Musculoskeletal: Negative muscle atrophy/wasting. M/S 4/5  throughout, Extremities without ischemic changes.  Neurologic: A&O X 3; Appropriate Affect, Speech is normal  CN 2-12 intact, Pain and light touch intact in extremities, Motor exam as listed above.         Non-Invasive Vascular Imaging CAROTID DUPLEX 03/29/2014   CEREBROVASCULAR DUPLEX EVALUATION    INDICATION: Carotid artery disease     PREVIOUS INTERVENTION(S):     DUPLEX EXAM:     RIGHT  LEFT  Peak Systolic Velocities (cm/s) End Diastolic Velocities (cm/s) Plaque LOCATION Peak Systolic Velocities (cm/s) End  Diastolic Velocities (cm/s) Plaque  31 0  CCA PROXIMAL 74 19   20 0  CCA MID 79 16   20 0 HM CCA DISTAL 58 19 HT  440 14 HM ECA 185 20 HT  Occluded  HT ICA PROXIMAL 312 69 CP  Occluded  HT ICA MID 102 28   Occluded   HT ICA DISTAL 84 27      ICA / CCA Ratio (PSV) 3.94  Antegrade  Vertebral Flow Antegrade    Brachial Systolic Pressure (mmHg)    Brachial Artery Waveforms     Plaque Morphology:  HM = Homogeneous, HT = Heterogeneous, CP = Calcific Plaque, SP = Smooth Plaque, IP = Irregular Plaque     ADDITIONAL FINDINGS:     IMPRESSION: Known occlusion of the right internal carotid artery. Left internal carotid artery velocities suggest a 60-79% stenosis; however, velocities may be overestimated due to compensatory flow from the known right internal carotid artery occlusion.    Compared to the previous exam:  No significant change in comparison to the last exam on 09/15/2013.      Assessment: SENECA GADBOIS is a 80 y.o. female who is seen for left-sided carotid stenosis with a known right ICA occlusion. Today's carotid Duplex reveals known occlusion of the right internal carotid artery. Left internal carotid artery velocities suggest a 60-79% stenosis; however, velocities may be overestimated due to compensatory flow from the known right internal carotid artery occlusion. No significant change in comparison to the last exam on 09/15/2013.  Plan: Follow-up in 6 months with Carotid Duplex.   I discussed in depth with the patient the nature of atherosclerosis, and emphasized the importance of maximal medical management including strict control of blood pressure, blood glucose, and lipid levels, obtaining regular exercise, and continued cessation of smoking.  The patient is aware that without maximal medical management the underlying atherosclerotic disease process will progress, limiting the benefit of any interventions. The patient was given information about stroke prevention and what  symptoms should prompt the patient to seek immediate medical care. Thank you for allowing [place] to participate in this patient's care.  Clemon Chambers, RN, MSN, FNP-C [facility] Office: [phone]  Clinic Physician: Early  03/29/2014 11:25 AM

## 2014-03-29 NOTE — Patient Instructions (Signed)
Stroke Prevention Some medical conditions and behaviors are associated with an increased chance of having a stroke. You may prevent a stroke by making healthy choices and managing medical conditions. HOW CAN I REDUCE MY RISK OF HAVING A STROKE?   Stay physically active. Get at least 30 minutes of activity on most or all days.  Do not smoke. It may also be helpful to avoid exposure to secondhand smoke.  Limit alcohol use. Moderate alcohol use is considered to be:  No more than 2 drinks per day for men.  No more than 1 drink per day for nonpregnant women.  Eat healthy foods. This involves:  Eating 5 or more servings of fruits and vegetables a day.  Making dietary changes that address high blood pressure (hypertension), high cholesterol, diabetes, or obesity.  Manage your cholesterol levels.  Making food choices that are high in fiber and low in saturated fat, trans fat, and cholesterol may control cholesterol levels.  Take any prescribed medicines to control cholesterol as directed by your health care provider.  Manage your diabetes.  Controlling your carbohydrate and sugar intake is recommended to manage diabetes.  Take any prescribed medicines to control diabetes as directed by your health care provider.  Control your hypertension.  Making food choices that are low in salt (sodium), saturated fat, trans fat, and cholesterol is recommended to manage hypertension.  Take any prescribed medicines to control hypertension as directed by your health care provider.  Maintain a healthy weight.  Reducing calorie intake and making food choices that are low in sodium, saturated fat, trans fat, and cholesterol are recommended to manage weight.  Stop drug abuse.  Avoid taking birth control pills.  Talk to your health care provider about the risks of taking birth control pills if you are over 35 years old, smoke, get migraines, or have ever had a blood clot.  Get evaluated for sleep  disorders (sleep apnea).  Talk to your health care provider about getting a sleep evaluation if you snore a lot or have excessive sleepiness.  Take medicines only as directed by your health care provider.  For some people, aspirin or blood thinners (anticoagulants) are helpful in reducing the risk of forming abnormal blood clots that can lead to stroke. If you have the irregular heart rhythm of atrial fibrillation, you should be on a blood thinner unless there is a good reason you cannot take them.  Understand all your medicine instructions.  Make sure that other conditions (such as anemia or atherosclerosis) are addressed. SEEK IMMEDIATE MEDICAL CARE IF:   You have sudden weakness or numbness of the face, arm, or leg, especially on one side of the body.  Your face or eyelid droops to one side.  You have sudden confusion.  You have trouble speaking (aphasia) or understanding.  You have sudden trouble seeing in one or both eyes.  You have sudden trouble walking.  You have dizziness.  You have a loss of balance or coordination.  You have a sudden, severe headache with no known cause.  You have new chest pain or an irregular heartbeat. Any of these symptoms may represent a serious problem that is an emergency. Do not wait to see if the symptoms will go away. Get medical help at once. Call your local emergency services (911 in [place] Do not drive yourself to the hospital. Document Released: 03/07/2004 Document Revised: 06/14/2013 Document Reviewed: 07/31/2012 [employer] Patient Information 2015 [employer]. This information is not intended to replace advice given   to you by your health care provider. Make sure you discuss any questions you have with your health care provider.  

## 2014-03-30 NOTE — Addendum Note (Signed)
Addended by: Dorthula Rue L on: 03/30/2014 12:03 PM   Modules accepted: Orders

## 2014-04-01 ENCOUNTER — Ambulatory Visit (INDEPENDENT_AMBULATORY_CARE_PROVIDER_SITE_OTHER): Payer: Medicare Other

## 2014-04-01 ENCOUNTER — Ambulatory Visit: Payer: Medicare Other | Admitting: Pharmacist

## 2014-04-01 DIAGNOSIS — I2699 Other pulmonary embolism without acute cor pulmonale: Secondary | ICD-10-CM

## 2014-04-01 DIAGNOSIS — I4891 Unspecified atrial fibrillation: Secondary | ICD-10-CM | POA: Diagnosis not present

## 2014-04-01 LAB — POCT INR: INR: 2.3

## 2014-04-05 DIAGNOSIS — M19072 Primary osteoarthritis, left ankle and foot: Secondary | ICD-10-CM | POA: Diagnosis not present

## 2014-04-06 DIAGNOSIS — E785 Hyperlipidemia, unspecified: Secondary | ICD-10-CM | POA: Diagnosis not present

## 2014-04-06 DIAGNOSIS — Z0000 Encounter for general adult medical examination without abnormal findings: Secondary | ICD-10-CM | POA: Diagnosis not present

## 2014-04-06 DIAGNOSIS — I10 Essential (primary) hypertension: Secondary | ICD-10-CM | POA: Diagnosis not present

## 2014-04-07 DIAGNOSIS — M19072 Primary osteoarthritis, left ankle and foot: Secondary | ICD-10-CM | POA: Diagnosis not present

## 2014-04-11 ENCOUNTER — Ambulatory Visit (INDEPENDENT_AMBULATORY_CARE_PROVIDER_SITE_OTHER): Payer: Medicare Other | Admitting: Podiatry

## 2014-04-11 ENCOUNTER — Encounter: Payer: Self-pay | Admitting: Podiatry

## 2014-04-11 VITALS — BP 161/99 | HR 107 | Temp 98.2°F | Resp 14

## 2014-04-11 DIAGNOSIS — L84 Corns and callosities: Secondary | ICD-10-CM

## 2014-04-11 NOTE — Progress Notes (Signed)
Patient ID: [name], female   DOB: 12/14/1934, 80 y.o.   MRN: [id number] Subjective: This patient presents for ongoing pre-ulcerative in ulcerative plantar keratoses on the left foot. The intervals between debridement are 2-3 weeks.  Objective: Hemorrhagic plantar keratoses sub-left first MPJ that remains closed after debridement. There is no erythema, edema, drainage surrounding the site  Assessment: Pre-ulcerative plantar keratoses plantar left first MPJ without clinical sign of infection  Plan: Debride plantar keratoses Patient will continue to wear soft shoe insole with additional felt pad to further offload the left first MPJ  Reappoint 3 weeks

## 2014-04-11 NOTE — Patient Instructions (Signed)
Continue to wear soft shoe insert with additional pad attached inside an athletic style shoe

## 2014-04-12 DIAGNOSIS — M19072 Primary osteoarthritis, left ankle and foot: Secondary | ICD-10-CM | POA: Diagnosis not present

## 2014-04-12 DIAGNOSIS — Z23 Encounter for immunization: Secondary | ICD-10-CM | POA: Diagnosis not present

## 2014-04-12 DIAGNOSIS — I10 Essential (primary) hypertension: Secondary | ICD-10-CM | POA: Diagnosis not present

## 2014-04-12 DIAGNOSIS — E780 Pure hypercholesterolemia: Secondary | ICD-10-CM | POA: Diagnosis not present

## 2014-04-15 DIAGNOSIS — R197 Diarrhea, unspecified: Secondary | ICD-10-CM | POA: Diagnosis not present

## 2014-04-15 DIAGNOSIS — M19072 Primary osteoarthritis, left ankle and foot: Secondary | ICD-10-CM | POA: Diagnosis not present

## 2014-04-18 DIAGNOSIS — H33059 Total retinal detachment, unspecified eye: Secondary | ICD-10-CM | POA: Diagnosis not present

## 2014-04-18 DIAGNOSIS — H33012 Retinal detachment with single break, left eye: Secondary | ICD-10-CM | POA: Diagnosis not present

## 2014-04-18 DIAGNOSIS — H43811 Vitreous degeneration, right eye: Secondary | ICD-10-CM | POA: Diagnosis not present

## 2014-04-20 DIAGNOSIS — M19072 Primary osteoarthritis, left ankle and foot: Secondary | ICD-10-CM | POA: Diagnosis not present

## 2014-04-20 DIAGNOSIS — M79672 Pain in left foot: Secondary | ICD-10-CM | POA: Diagnosis not present

## 2014-04-26 DIAGNOSIS — M19072 Primary osteoarthritis, left ankle and foot: Secondary | ICD-10-CM | POA: Diagnosis not present

## 2014-04-27 ENCOUNTER — Ambulatory Visit (INDEPENDENT_AMBULATORY_CARE_PROVIDER_SITE_OTHER): Payer: Medicare Other | Admitting: *Deleted

## 2014-04-27 DIAGNOSIS — I2699 Other pulmonary embolism without acute cor pulmonale: Secondary | ICD-10-CM | POA: Diagnosis not present

## 2014-04-27 DIAGNOSIS — I4891 Unspecified atrial fibrillation: Secondary | ICD-10-CM | POA: Diagnosis not present

## 2014-04-27 LAB — POCT INR: INR: 1.9

## 2014-05-02 ENCOUNTER — Ambulatory Visit (INDEPENDENT_AMBULATORY_CARE_PROVIDER_SITE_OTHER): Payer: Medicare Other | Admitting: Podiatry

## 2014-05-02 ENCOUNTER — Encounter: Payer: Self-pay | Admitting: Podiatry

## 2014-05-02 VITALS — BP 124/79 | HR 100 | Resp 12

## 2014-05-02 DIAGNOSIS — L84 Corns and callosities: Secondary | ICD-10-CM | POA: Diagnosis not present

## 2014-05-02 DIAGNOSIS — M797 Fibromyalgia: Secondary | ICD-10-CM | POA: Diagnosis not present

## 2014-05-02 DIAGNOSIS — M159 Polyosteoarthritis, unspecified: Secondary | ICD-10-CM | POA: Diagnosis not present

## 2014-05-02 DIAGNOSIS — M109 Gout, unspecified: Secondary | ICD-10-CM | POA: Diagnosis not present

## 2014-05-02 NOTE — Patient Instructions (Signed)
Wear the custom shoe insoles on a daily basis

## 2014-05-03 NOTE — Progress Notes (Signed)
Patient ID: [name], female   DOB: 04-02-1934, 80 y.o.   MRN: [id number]  Subjective: This patient presents for ongoing care for pre-ulcerative plantar keratoses on the left foot with intervals between debridement at approximately 3 weeks.  Objective: Well-organized plantar hemorrhagic callus plantar aspect of the left first MPJ that remains closed after debridement. There is no surrounding erythema, edema, drainage noted  Assessment: Pre-ulcerative plantar callus left first MPJ without clinical sign of infection  Plan: Debridement of plantar callus Patient will continue to wear shoes with custom insoles with pocket accommodation to offload the plantar first MPJ with an additional felt pad attached to the insole to further deepen the first MPJ area  Reappoint 3 months

## 2014-05-09 DIAGNOSIS — N3020 Other chronic cystitis without hematuria: Secondary | ICD-10-CM | POA: Diagnosis not present

## 2014-05-18 ENCOUNTER — Ambulatory Visit (INDEPENDENT_AMBULATORY_CARE_PROVIDER_SITE_OTHER): Payer: Medicare Other | Admitting: Surgery

## 2014-05-18 DIAGNOSIS — I4891 Unspecified atrial fibrillation: Secondary | ICD-10-CM | POA: Diagnosis not present

## 2014-05-18 DIAGNOSIS — I2699 Other pulmonary embolism without acute cor pulmonale: Secondary | ICD-10-CM | POA: Diagnosis not present

## 2014-05-18 LAB — POCT INR: INR: 2.2

## 2014-05-23 ENCOUNTER — Encounter: Payer: Self-pay | Admitting: Podiatry

## 2014-05-23 ENCOUNTER — Ambulatory Visit (INDEPENDENT_AMBULATORY_CARE_PROVIDER_SITE_OTHER): Payer: Medicare Other | Admitting: Podiatry

## 2014-05-23 VITALS — BP 122/71 | HR 93 | Resp 12

## 2014-05-23 DIAGNOSIS — L84 Corns and callosities: Secondary | ICD-10-CM

## 2014-05-24 DIAGNOSIS — I10 Essential (primary) hypertension: Secondary | ICD-10-CM | POA: Diagnosis not present

## 2014-05-24 DIAGNOSIS — R600 Localized edema: Secondary | ICD-10-CM | POA: Diagnosis not present

## 2014-05-24 NOTE — Progress Notes (Signed)
Patient ID: [name], female   DOB: Oct 27, 1934, 80 y.o.   MRN: [id number]  Subjective: This patient presents for ongoing care for pre-ulcerative plantar callus on the left foot. She wears a custom foot orthotic with a metatarsal raise with an additional felt pad to further offload the plantar left first MPJ  Objective: Husband present in room Plantar left first MPJ has bleeding callus that remains closed after debridement. There is no surrounding erythema, edema or drainage noted  Assessment: Pre-ulcerative plantar callus sub-left first MPJ without any clinical sign of infection  Plan: Debridement of pre-ulcerative callus without a bleeding Continue wearing custom foot orthotics in athletic style shoe  Reappoint 4 weeks

## 2014-05-25 DIAGNOSIS — M19072 Primary osteoarthritis, left ankle and foot: Secondary | ICD-10-CM | POA: Diagnosis not present

## 2014-06-07 ENCOUNTER — Ambulatory Visit (INDEPENDENT_AMBULATORY_CARE_PROVIDER_SITE_OTHER): Payer: Medicare Other | Admitting: Cardiology

## 2014-06-07 ENCOUNTER — Encounter: Payer: Self-pay | Admitting: Cardiology

## 2014-06-07 VITALS — BP 130/82 | HR 89 | Ht 62.0 in | Wt 232.0 lb

## 2014-06-07 DIAGNOSIS — I6523 Occlusion and stenosis of bilateral carotid arteries: Secondary | ICD-10-CM

## 2014-06-07 DIAGNOSIS — I4891 Unspecified atrial fibrillation: Secondary | ICD-10-CM

## 2014-06-07 DIAGNOSIS — I119 Hypertensive heart disease without heart failure: Secondary | ICD-10-CM | POA: Diagnosis not present

## 2014-06-07 DIAGNOSIS — I5033 Acute on chronic diastolic (congestive) heart failure: Secondary | ICD-10-CM | POA: Diagnosis not present

## 2014-06-07 MED ORDER — FUROSEMIDE 20 MG PO TABS: ORAL_TABLET | ORAL | Status: DC

## 2014-06-07 NOTE — Progress Notes (Signed)
Cardiology Office Note   Date:  06/07/2014   ID:  [name], DOB [birth year], MRN [id number]  PCP:  Horatio Pel, MD  Cardiologist:   Darlin Coco, MD   No chief complaint on file.     History of Present Illness: [name] is a 80 y.o. female who presents for 3 month follow-up office visit  This pleasant 80 year old woman is seen for followup office visit. She was admitted to the hospital in February 2015 because of new onset atrial fibrillation. She was already on long-term Coumadin. Diltiazem for rate control was added to her regimen in the hospital. The patient has a past history of pulmonary emboli and is on long-term Coumadin. Her INR has been stable. She has a history of essential hypertension and a history of right carotid artery occlusion. She has a past history of pulmonary emboli in January 2011 after right hip surgery and at that time venous Dopplers of the legs were negative for a source of embolization and she has remained on long-term Coumadin. 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 Donnetta Hutching. She does not have any significant coronary disease and she had cardiac catheterization in 1994 showing only mild plaque and she had a nuclear stress test in December 2011 showing no ischemia and her ejection fraction was 78%. Earlier this year she had left total shoulder replacement surgery by Dr. Onnie Graham. In October she saw pulmonary because of exertional dyspnea. She had a chest x-ray on 11/24/12 showing cardiomegaly. She had a VQ scan which was negative for new pulmonary emboli. The patient had an echocardiogram on 05/25/12 which showed an ejection fraction of 55-60% and no significant valve abnormalities.  Since we last saw her she has had ongoing problems with fluctuating blood pressure elevation as well as severe weakness and lack of energy and feeling that her legs were encased in cement  when she tried to walk. She had a buccal smear done at her PCP office for DNA. She states that the DNA results showed that she did not metabolize metoprolol well. Her metoprolol was stopped and she was switched to nebivolol and she has felt much better. She continues to have rapid response to her atrial fibrillation. Her heart rate has improved however since we increased the dose of bystolic up to 10 mg daily. Recently Dr. Shelia Media tried her on half of a 25 mg Zoloft. She did not tolerate it. The patient states that when she takes half of a 40 mg Lasix it leaves her feeling very lightheaded and weak over the next 12-24 hours.  We agreed that we should try a smaller dose.  We will: The 20 mg tablets and she will take half of a 20 each day.  She is also on Lozol every day. She has been having problems with foot pain but she saw her orthopedist who felt that she should not undergo major surgery on her feet because of all of her other medical problems.  It would have been required that she would have to have about 6 months of no activity.  Past Medical History  Diagnosis Date  . Hypertension   . Gout   . Tinnitus   . Acid reflux disease   . Pulmonary embolism 2011    a. Hx of PE in 02/2009 after R hip surgery, venous dopplers negative, long-term Coumadin.  . Vertigo   . Crohn  disease   . Long term current use of anticoagulant   . GERD (gastroesophageal reflux disease)   . Recurrent upper respiratory infection (URI)     BRONCHITIS FEB 2013--SLIGHT COUGH NON-PRODUCTIVE NOW  . H/O hiatal hernia   . PVC (premature ventricular contraction)     PT STATES HX OF PVC'S ON EKG  . Peripheral vascular disease     KNOWN RIGHT INTERNAL CAROTID ARTERY OCCLUSION (NO STROKE)  --40 TO 59% STENOSIS LEFT ICA-FOLLOWED BY DR. EARLY WITH DOPPLER STUDY EVERY 6 MONTHS  . Fibromyalgia   . High triglycerides   . Obstructive sleep apnea on CPAP   . Crohn's disease   . History of stomach ulcers 1950's  .  Osteoarthritis     PAIN AND OA LEF T HIP AND BOTH SHOULDERS ARE BONE ON BONE AND PAINFUL  . Recurrent UTI (urinary tract infection)     "on daily medicine" (05/14/2012)  . Carotid artery occlusion   . Detached retina   . Atrial fibrillation   . Fibromyalgia   . Chronic diastolic CHF (congestive heart failure)     Hypertensive heart disease 02-12-14    Past Surgical History  Procedure Laterality Date  . Femur fracture surgery Right 02/21/09    Open reduction internal fixation of right periprosthetic  femur fracture utilizing Zimmer cables times fiv  . Hammer toe surgery Left 10/25/08    "toe next to big to" (05/14/2012)  . Back surgery  10/29/06    Central and foraminal decompression L3-L4, L4-L5, and L5-S1 with inspection of L4-L5 and L5-S1 disc on the right  . Knee arthroscopy Right 07/26/05  . Total hip arthroplasty Right 12/08/02    Osteonics total hip replacement  . Excision morton's neuroma Right 05/20/00    "foot" (05/14/2012)  . Total hip arthroplasty  06/17/2011    Procedure: TOTAL HIP ARTHROPLASTY;  Surgeon: Gearlean Alf, MD;  Location: [facility];  Service: Orthopedics;  Laterality: Left;  . Joint replacement  2009    Right Hip replacement  . Joint replacement  2012    Right knee replacement  . Joint replacement  06/17/11    Left Hip replacement  . Fracture surgery  2011    Right Femur Fx  . Total shoulder arthroplasty Left 05/14/2012  . Tonsillectomy and adenoidectomy  1958?  [name] Kitchen Appendectomy  1950's  . Cholecystectomy  ~ 1988  . Vaginal hysterectomy  1971  . Dilation and curettage of uterus      "several; from miscarriages" (05/14/2012)  . Decompressive lumbar laminectomy level 3  ~ 2002  . Replacement total knee Right 02/19/2010  . Cardiac catheterization  1970's    "maybe 2" (05/14/2012)  . Cardiac catheterization    . Cataract extraction w/ intraocular lens implant Right 2009  . Total shoulder arthroplasty Left 05/14/2012    Procedure: TOTAL SHOULDER ARTHROPLASTY;  Surgeon: Marin Shutter, MD;  Location: [facility];  Service: Orthopedics;  Laterality: Left;  [name] Kitchen Eye surgery Left 2014    Detached retina  . Eye surgery Left August 23, 2013    Cataract  . Spine surgery       Current Outpatient Prescriptions  Medication Sig Dispense Refill  . acetaminophen (TYLENOL) 500 MG tablet Take 500 mg by mouth daily as needed for headache.    . allopurinol (ZYLOPRIM) 300 MG tablet Take 300 mg by mouth Daily.     [name] Kitchen amLODipine (NORVASC) 5 MG tablet Take 1 tablet (5 mg total) by mouth daily. 30 tablet 6  .  calcium citrate-vitamin D (CITRACAL+D) 315-200 MG-UNIT per tablet Take 1 tablet by mouth daily.    . CELEBREX 200 MG capsule Take 200 mg by mouth Daily.    . cetirizine (ZYRTEC) 10 MG tablet Take 10 mg by mouth daily.    . cholecalciferol (VITAMIN D) 1000 UNITS tablet Take 4,000 Units by mouth daily.     [name] Kitchen diltiazem (CARDIZEM) 60 MG tablet Take 60 mg by mouth every 6 (six) hours as needed (symptomatic AFIB).     [name] Kitchen diphenhydrAMINE (BENADRYL) 25 mg capsule Take 25 mg by mouth at bedtime as needed for sleep. For sleep    . dorzolamide-timolol (COSOPT) 22.3-6.8 MG/ML ophthalmic solution Place 1 drop into both eyes 2 (two) times daily.     . fenofibrate 160 MG tablet Take 160 mg by mouth every evening.     . furosemide (LASIX) 20 MG tablet 1/2 TABLET BY MOUTH DAILY 45 tablet 3  . HYDROcodone-acetaminophen (NORCO) 10-325 MG per tablet Take 1 tablet by mouth every 6 (six) hours as needed (pain).     . indapamide (LOZOL) 1.25 MG tablet Take 1.25 mg by mouth every morning.    [name] Kitchen ketoconazole (NIZORAL) 2 % cream Apply 1 application topically 2 (two) times daily as needed for irritation.     . Magnesium 400 MG CAPS Take 400 mg by mouth daily.     . Multiple Vitamins-Minerals (MULTIVITAMIN WITH MINERALS) tablet Take 1 tablet by mouth daily.    . nebivolol (BYSTOLIC) 10 MG tablet Take 1 tablet (10 mg total) by mouth daily. 30 tablet 6  . nitrofurantoin (MACRODANTIN) 50 MG capsule Take 50 mg by mouth  daily.     [name] Kitchen omeprazole (PRILOSEC) 40 MG capsule Take 40 mg by mouth daily with supper.     . vitamin C (ASCORBIC ACID) 500 MG tablet Take 500 mg by mouth daily.    [name] Kitchen warfarin (COUMADIN) 2.5 MG tablet Take 2.5 mg by mouth daily. Take 1 tablet by mouth every day except Tuesday. Take 1/2 tablet on Tuesday    . [DISCONTINUED] amitriptyline (ELAVIL) 10 MG tablet Take 10 mg by mouth at bedtime.      No current facility-administered medications for this visit.    Allergies:   Ciprofloxacin; Codeine; Cymbalta; Diltiazem cd; Metoprolol; Nortriptyline; Penicillins; Statins; Tetanus toxoids; and Lyrica    Social History:  The patient  reports that she quit smoking about 28 years ago. Her smoking use included Cigarettes. She has a 7.5 pack-year smoking history. She has never used smokeless tobacco. She reports that she drinks alcohol. She reports that she does not use illicit drugs.   Family History:  The patient's family history includes Aneurysm in her mother; Diabetes in her paternal grandfather and son; Heart attack in her brother; Heart attack (age of onset: 25) in her father; Heart disease in her brother, father, and mother; Hyperlipidemia in her brother; Hypertension in her brother, brother, father, and mother; Peripheral vascular disease in her father; Stroke in her mother.    ROS:  Please see the history of present illness.   Otherwise, review of systems are positive for none.   All other systems are reviewed and negative.    PHYSICAL EXAM: VS:  BP 130/82 mmHg  Pulse 89  Ht 5' 2"  (1.575 m)  Wt 232 lb (105.235 kg)  BMI 42.42 kg/m2  SpO2 92% , BMI Body mass index is 42.42 kg/(m^2). GEN: Well nourished, well developed, in no acute distress HEENT: normal Neck: no JVD, carotid bruits,  or masses Cardiac: RRR; no murmurs, rubs, or gallops,no edema  Respiratory:  clear to auscultation bilaterally, normal work of breathing GI: soft, nontender, nondistended, + BS MS: no deformity or  atrophy Skin: warm and dry, no rash Neuro:  Strength and sensation are intact Psych: euthymic mood, full affect   EKG:  EKG is not ordered today.    Recent Labs: 02/12/2014: Hemoglobin 12.9; Platelets 131* 02/18/2014: BUN 25*; Creatinine 1.0; Potassium 4.1; Sodium 146*    Lipid Panel No results found for: CHOL, TRIG, HDL, CHOLHDL, VLDL, LDLCALC, LDLDIRECT    Wt Readings from Last 3 Encounters:  06/07/14 232 lb (105.235 kg)  03/29/14 236 lb (107.049 kg)  02/07/14 241 lb (109.317 kg)      ASSESSMENT AND PLAN:  1. paroxysmal atrial fibrillation on long-term anticoagulation. Doing well on rate control and long-term anticoagulation. 2. prior pulmonary emboli, on warfarin 3. essential hypertension with intolerance to metoprolol. 4. Chronic diastolic CHF  Plan: Continue current medication.Reduce furosemide to just 10 mg a day.   Current medicines are reviewed at length with the patient today.  The patient does not have concerns regarding medicines.  The following changes have been made:  no change  Labs/ tests ordered today include:  No orders of the defined types were placed in this encounter.    Disposition: Recheck in 3 months for office visit.  He was furosemide to half of a 20 mg tablet daily.  Signed, Darlin Coco, MD  06/07/2014 5:49 PM    [facility] [address], [place], [place]  [address] Phone: [phone]; Fax: [phone]

## 2014-06-07 NOTE — Patient Instructions (Signed)
Medication Instructions:  DECREASE YOUR LASIX (FUROSEMIDE) TO 20 MG 1/2 TABLET DAILY   Labwork: NONE  Testing/Procedures: NONE  Follow-Up: Your physician recommends that you schedule a follow-up appointment in: 3 MONTH OV

## 2014-06-08 ENCOUNTER — Other Ambulatory Visit: Payer: Self-pay | Admitting: Cardiology

## 2014-06-15 ENCOUNTER — Ambulatory Visit (INDEPENDENT_AMBULATORY_CARE_PROVIDER_SITE_OTHER): Payer: Medicare Other | Admitting: *Deleted

## 2014-06-15 DIAGNOSIS — I2699 Other pulmonary embolism without acute cor pulmonale: Secondary | ICD-10-CM | POA: Diagnosis not present

## 2014-06-15 DIAGNOSIS — I4891 Unspecified atrial fibrillation: Secondary | ICD-10-CM

## 2014-06-15 LAB — POCT INR: INR: 1.7

## 2014-06-20 ENCOUNTER — Ambulatory Visit (INDEPENDENT_AMBULATORY_CARE_PROVIDER_SITE_OTHER): Payer: Medicare Other | Admitting: Podiatry

## 2014-06-20 ENCOUNTER — Encounter: Payer: Self-pay | Admitting: Podiatry

## 2014-06-20 VITALS — BP 145/72 | HR 86 | Resp 12

## 2014-06-20 DIAGNOSIS — L84 Corns and callosities: Secondary | ICD-10-CM

## 2014-06-20 NOTE — Patient Instructions (Signed)
Use an additional foam pad placed around the pre-ulcerative callus on the bottom of the left foot Continue to wear custom foot orthotics in athletic style shoes

## 2014-06-21 NOTE — Progress Notes (Signed)
Patient ID: [name], female   DOB: Jun 20, 1934, 80 y.o.   MRN: [id number]  Subjective: This patient presents again for ongoing care for pre-ulcerative plantar callus left foot. She wears a custom foot orthotic with additional padding attached to a to offload the plantar left first MPJ  Objective: Patient's husband present to treatment room Bleeding callus plantar left first MPJ that remains closed after debridement. There is no surrounding erythema, edema, drainage or warmth noted  Assessment: Pre-ulcerative plantar callus sub-left first MPJ without any clinical sign of infection  Plan: Debridement of pre-ulcerative callus 1 without any bleeding  Continue wearing custom foot orthotics in athletic style shoes  Reappoint 4 weeks

## 2014-06-30 DIAGNOSIS — R358 Other polyuria: Secondary | ICD-10-CM | POA: Diagnosis not present

## 2014-07-06 ENCOUNTER — Ambulatory Visit (INDEPENDENT_AMBULATORY_CARE_PROVIDER_SITE_OTHER): Payer: Medicare Other | Admitting: *Deleted

## 2014-07-06 DIAGNOSIS — I2699 Other pulmonary embolism without acute cor pulmonale: Secondary | ICD-10-CM | POA: Diagnosis not present

## 2014-07-06 DIAGNOSIS — I4891 Unspecified atrial fibrillation: Secondary | ICD-10-CM

## 2014-07-06 LAB — POCT INR: INR: 2

## 2014-07-13 DIAGNOSIS — Z961 Presence of intraocular lens: Secondary | ICD-10-CM | POA: Diagnosis not present

## 2014-07-13 DIAGNOSIS — H40053 Ocular hypertension, bilateral: Secondary | ICD-10-CM | POA: Diagnosis not present

## 2014-07-18 ENCOUNTER — Ambulatory Visit: Payer: Medicare Other | Admitting: Podiatry

## 2014-07-18 DIAGNOSIS — L300 Nummular dermatitis: Secondary | ICD-10-CM | POA: Diagnosis not present

## 2014-07-27 ENCOUNTER — Ambulatory Visit (INDEPENDENT_AMBULATORY_CARE_PROVIDER_SITE_OTHER): Payer: Medicare Other | Admitting: Podiatry

## 2014-07-27 ENCOUNTER — Encounter: Payer: Self-pay | Admitting: Podiatry

## 2014-07-27 VITALS — BP 143/77 | HR 87 | Temp 98.0°F | Resp 16

## 2014-07-27 DIAGNOSIS — L84 Corns and callosities: Secondary | ICD-10-CM | POA: Diagnosis not present

## 2014-07-27 NOTE — Patient Instructions (Signed)
Wear the custom insoles with the additional felt pad attached to the insole on an ongoing continuous daily basis.

## 2014-07-27 NOTE — Progress Notes (Signed)
Patient ID: [name], female   DOB: May 07, 1934, 80 y.o.   MRN: [id number]  Subjective: This patient presents again for follow-up care for pre-ulcerative plantar callus on the left first MPJ  Objective: Bleeding callus plantar left first MPJ that remains closed after debridement. There is no surrounding erythema, edema, warmth, drainage  Assessment: Pre-ulcerative plantar callus sub-first MPJ right without a clinical sign of infection  Plan: Debridement of pre-ulcerative callus Maintain continue wearing custom foot orthotics with additional felt pad to offload the plantar left first MPJ Patient encouraged to wear protective padding at all times except when sleeping and showering  Reappoint 4 weeks

## 2014-08-03 ENCOUNTER — Ambulatory Visit (INDEPENDENT_AMBULATORY_CARE_PROVIDER_SITE_OTHER): Payer: Medicare Other | Admitting: *Deleted

## 2014-08-03 DIAGNOSIS — I2699 Other pulmonary embolism without acute cor pulmonale: Secondary | ICD-10-CM | POA: Diagnosis not present

## 2014-08-03 DIAGNOSIS — G4733 Obstructive sleep apnea (adult) (pediatric): Secondary | ICD-10-CM | POA: Diagnosis not present

## 2014-08-03 DIAGNOSIS — I4891 Unspecified atrial fibrillation: Secondary | ICD-10-CM | POA: Diagnosis not present

## 2014-08-03 LAB — POCT INR: INR: 1.7

## 2014-08-11 DIAGNOSIS — M19072 Primary osteoarthritis, left ankle and foot: Secondary | ICD-10-CM | POA: Diagnosis not present

## 2014-08-11 DIAGNOSIS — M792 Neuralgia and neuritis, unspecified: Secondary | ICD-10-CM | POA: Diagnosis not present

## 2014-08-24 ENCOUNTER — Ambulatory Visit (INDEPENDENT_AMBULATORY_CARE_PROVIDER_SITE_OTHER): Payer: Medicare Other | Admitting: Pharmacist

## 2014-08-24 ENCOUNTER — Encounter: Payer: Self-pay | Admitting: Podiatry

## 2014-08-24 ENCOUNTER — Ambulatory Visit (INDEPENDENT_AMBULATORY_CARE_PROVIDER_SITE_OTHER): Payer: Medicare Other | Admitting: Podiatry

## 2014-08-24 VITALS — BP 144/76 | HR 86 | Temp 97.1°F | Resp 12

## 2014-08-24 DIAGNOSIS — L84 Corns and callosities: Secondary | ICD-10-CM | POA: Diagnosis not present

## 2014-08-24 DIAGNOSIS — I2699 Other pulmonary embolism without acute cor pulmonale: Secondary | ICD-10-CM | POA: Diagnosis not present

## 2014-08-24 DIAGNOSIS — I4891 Unspecified atrial fibrillation: Secondary | ICD-10-CM | POA: Diagnosis not present

## 2014-08-24 LAB — POCT INR: INR: 1.6

## 2014-08-24 NOTE — Progress Notes (Signed)
Patient ID: [name], female   DOB: [birth year], 80 y.o.   MRN: [id number]  Subjective: This patient presents for ongoing management of pre-ulcerative in ulcerative skin lesion on the plantar aspect of the left foot  Objective: Hemorrhagic plantar callus first MPJ left without any surrounding erythema, edema, warmth, drainage  Assessment: Pre-ulcerative plantar keratoses left first MPJ.  Plan: Debrided plantar callus Patient will continue wearing accommodative foot orthotic with extra padding to offload the first MPJ  Reappoint 4 weeks

## 2014-08-31 ENCOUNTER — Ambulatory Visit: Payer: Medicare Other | Admitting: Podiatry

## 2014-09-07 ENCOUNTER — Encounter: Payer: Self-pay | Admitting: Cardiology

## 2014-09-07 ENCOUNTER — Ambulatory Visit (INDEPENDENT_AMBULATORY_CARE_PROVIDER_SITE_OTHER): Payer: Medicare Other | Admitting: Pharmacist

## 2014-09-07 ENCOUNTER — Ambulatory Visit (INDEPENDENT_AMBULATORY_CARE_PROVIDER_SITE_OTHER): Payer: Medicare Other | Admitting: Cardiology

## 2014-09-07 VITALS — BP 160/90 | HR 90 | Ht 62.0 in | Wt 234.4 lb

## 2014-09-07 DIAGNOSIS — I6523 Occlusion and stenosis of bilateral carotid arteries: Secondary | ICD-10-CM

## 2014-09-07 DIAGNOSIS — I4891 Unspecified atrial fibrillation: Secondary | ICD-10-CM

## 2014-09-07 DIAGNOSIS — I5033 Acute on chronic diastolic (congestive) heart failure: Secondary | ICD-10-CM | POA: Diagnosis not present

## 2014-09-07 DIAGNOSIS — I2699 Other pulmonary embolism without acute cor pulmonale: Secondary | ICD-10-CM | POA: Diagnosis not present

## 2014-09-07 DIAGNOSIS — R0609 Other forms of dyspnea: Secondary | ICD-10-CM | POA: Diagnosis not present

## 2014-09-07 DIAGNOSIS — I119 Hypertensive heart disease without heart failure: Secondary | ICD-10-CM | POA: Diagnosis not present

## 2014-09-07 LAB — POCT INR: INR: 1.5

## 2014-09-07 NOTE — Patient Instructions (Signed)
Medication Instructions:  Your physician recommends that you continue on your current medications as directed. Please refer to the Current Medication list given to you today.  Labwork: NONE  Testing/Procedures: NONE  Follow-Up: Your physician recommends that you schedule a follow-up appointment in: [place]

## 2014-09-07 NOTE — Progress Notes (Signed)
Cardiology Office Note   Date:  09/07/2014   ID:  [name], DOB [birth year], MRN [id number]  PCP:  Horatio Pel, MD  Cardiologist: Darlin Coco MD  No chief complaint on file.     History of Present Illness: [name] is a 80 y.o. female who presents for four-month follow-up visit This pleasant 80 year old woman is seen for followup office visit. She was admitted to the hospital in February 2015 because of new onset atrial fibrillation. She was already on long-term Coumadin. Diltiazem for rate control was added to her regimen in the hospital. The patient has a past history of pulmonary emboli and is on long-term Coumadin. Her INR has been stable. She has a history of essential hypertension and a history of right carotid artery occlusion. She has a past history of pulmonary emboli in January 2011 after right hip surgery and at that time venous Dopplers of the legs were negative for a source of embolization and she has remained on long-term Coumadin. 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 Donnetta Hutching. She does not have any significant coronary disease and she had cardiac catheterization in 1994 showing only mild plaque and she had a nuclear stress test in December 2011 showing no ischemia and her ejection fraction was 78%. Earlier this year she had left total shoulder replacement surgery by Dr. Onnie Graham. In October she saw pulmonary because of exertional dyspnea. She had a chest x-ray on 11/24/12 showing cardiomegaly. She had a VQ scan which was negative for new pulmonary emboli. The patient had an echocardiogram on 05/25/12 which showed an ejection fraction of 55-60% and no significant valve abnormalities.  She is very sensitive to diuretic.  Currently she takes Lasix 10 mg every third day.. She is also on Lozol every day. She has been having problems with foot pain but she saw her orthopedist who  felt that she should not undergo major surgery on her feet because of all of her other medical problems. It would have been required that she would have to have about 6 months of no activity.   Past Medical History  Diagnosis Date  . Hypertension   . Gout   . Tinnitus   . Acid reflux disease   . Pulmonary embolism 2011    a. Hx of PE in 02/2009 after R hip surgery, venous dopplers negative, long-term Coumadin.  . Vertigo   . Crohn disease   . Long term current use of anticoagulant   . GERD (gastroesophageal reflux disease)   . Recurrent upper respiratory infection (URI)     BRONCHITIS FEB 2013--SLIGHT COUGH NON-PRODUCTIVE NOW  . H/O hiatal hernia   . PVC (premature ventricular contraction)     PT STATES HX OF PVC'S ON EKG  . Peripheral vascular disease     KNOWN RIGHT INTERNAL CAROTID ARTERY OCCLUSION (NO STROKE)  --40 TO 59% STENOSIS LEFT ICA-FOLLOWED BY DR. EARLY WITH DOPPLER STUDY EVERY 6 MONTHS  . Fibromyalgia   . High triglycerides   . Obstructive sleep apnea on CPAP   . Crohn's disease   . History of stomach ulcers 1950's  . Osteoarthritis     PAIN AND OA LEF T HIP AND BOTH SHOULDERS ARE BONE ON BONE AND PAINFUL  . Recurrent UTI (urinary tract infection)     "on daily medicine" (05/14/2012)  . Carotid artery occlusion   . Detached retina   .  Atrial fibrillation   . Fibromyalgia   . Chronic diastolic CHF (congestive heart failure)     Hypertensive heart disease 02-12-14    Past Surgical History  Procedure Laterality Date  . Femur fracture surgery Right 02/21/09    Open reduction internal fixation of right periprosthetic  femur fracture utilizing Zimmer cables times fiv  . Hammer toe surgery Left 10/25/08    "toe next to big to" (05/14/2012)  . Back surgery  10/29/06    Central and foraminal decompression L3-L4, L4-L5, and L5-S1 with inspection of L4-L5 and L5-S1 disc on the right  . Knee arthroscopy Right 07/26/05  . Total hip arthroplasty Right 12/08/02    Osteonics total  hip replacement  . Excision morton's neuroma Right 05/20/00    "foot" (05/14/2012)  . Total hip arthroplasty  06/17/2011    Procedure: TOTAL HIP ARTHROPLASTY;  Surgeon: Gearlean Alf, MD;  Location: [facility];  Service: Orthopedics;  Laterality: Left;  . Joint replacement  2009    Right Hip replacement  . Joint replacement  2012    Right knee replacement  . Joint replacement  06/17/11    Left Hip replacement  . Fracture surgery  2011    Right Femur Fx  . Total shoulder arthroplasty Left 05/14/2012  . Tonsillectomy and adenoidectomy  1958?  [name] Kitchen Appendectomy  1950's  . Cholecystectomy  ~ 1988  . Vaginal hysterectomy  1971  . Dilation and curettage of uterus      "several; from miscarriages" (05/14/2012)  . Decompressive lumbar laminectomy level 3  ~ 2002  . Replacement total knee Right 02/19/2010  . Cardiac catheterization  1970's    "maybe 2" (05/14/2012)  . Cardiac catheterization    . Cataract extraction w/ intraocular lens implant Right 2009  . Total shoulder arthroplasty Left 05/14/2012    Procedure: TOTAL SHOULDER ARTHROPLASTY;  Surgeon: Marin Shutter, MD;  Location: [place];  Service: Orthopedics;  Laterality: Left;  [name] Kitchen Eye surgery Left 2014    Detached retina  . Eye surgery Left August 23, 2013    Cataract  . Spine surgery       Current Outpatient Prescriptions  Medication Sig Dispense Refill  . acetaminophen (TYLENOL) 500 MG tablet Take 500 mg by mouth daily as needed for headache.    . allopurinol (ZYLOPRIM) 300 MG tablet Take 300 mg by mouth Daily.     [name] Kitchen amLODipine (NORVASC) 5 MG tablet Take 1 tablet (5 mg total) by mouth daily. 30 tablet 6  . calcium citrate-vitamin D (CITRACAL+D) 315-200 MG-UNIT per tablet Take 1 tablet by mouth daily.    . CELEBREX 200 MG capsule Take 200 mg by mouth Daily.    . cetirizine (ZYRTEC) 10 MG tablet Take 10 mg by mouth daily.    . cholecalciferol (VITAMIN D) 1000 UNITS tablet Take 4,000 Units by mouth daily.     [name] Kitchen diltiazem (CARDIZEM) 60 MG tablet Take 60  mg by mouth every 6 (six) hours as needed (symptomatic AFIB).     [name] Kitchen diphenhydrAMINE (BENADRYL) 25 mg capsule Take 25 mg by mouth at bedtime as needed for sleep. For sleep    . dorzolamide-timolol (COSOPT) 22.3-6.8 MG/ML ophthalmic solution Place 1 drop into both eyes 2 (two) times daily.     . fenofibrate 160 MG tablet Take 160 mg by mouth every evening.     . furosemide (LASIX) 20 MG tablet 1/2 TABLET BY MOUTH DAILY 45 tablet 3  . HYDROcodone-acetaminophen (NORCO) 10-325 MG per tablet Take  1 tablet by mouth every 6 (six) hours as needed (pain).     . indapamide (LOZOL) 1.25 MG tablet Take 1.25 mg by mouth every morning.    [name] Kitchen ketoconazole (NIZORAL) 2 % cream Apply 1 application topically 2 (two) times daily as needed for irritation.     . Magnesium 400 MG CAPS Take 400 mg by mouth daily.     . Multiple Vitamins-Minerals (MULTIVITAMIN WITH MINERALS) tablet Take 1 tablet by mouth daily.    . nebivolol (BYSTOLIC) 10 MG tablet Take 1 tablet (10 mg total) by mouth daily. 30 tablet 6  . nitrofurantoin (MACRODANTIN) 50 MG capsule Take 50 mg by mouth daily.     [name] Kitchen omeprazole (PRILOSEC) 40 MG capsule Take 40 mg by mouth daily with supper.     . vitamin C (ASCORBIC ACID) 500 MG tablet Take 500 mg by mouth daily.    [name] Kitchen warfarin (COUMADIN) 2.5 MG tablet TAKE (1) TABLET AS DIRECTED. 35 tablet 3  . [DISCONTINUED] amitriptyline (ELAVIL) 10 MG tablet Take 10 mg by mouth at bedtime.      No current facility-administered medications for this visit.    Allergies:   Ciprofloxacin; Codeine; Cymbalta; Diltiazem cd; Metoprolol; Nortriptyline; Penicillins; Statins; Tetanus toxoids; and Lyrica    Social History:  The patient  reports that she quit smoking about 28 years ago. Her smoking use included Cigarettes. She has a 7.5 pack-year smoking history. She has never used smokeless tobacco. She reports that she drinks alcohol. She reports that she does not use illicit drugs.   Family History:  The patient's family  history includes Aneurysm in her mother; Diabetes in her paternal grandfather and son; Heart attack in her brother; Heart attack (age of onset: 50) in her father; Heart disease in her brother, father, and mother; Hyperlipidemia in her brother; Hypertension in her brother, brother, father, and mother; Peripheral vascular disease in her father; Stroke in her mother.    ROS:  Please see the history of present illness.   Otherwise, review of systems are positive for none.   All other systems are reviewed and negative.    PHYSICAL EXAM: VS:  BP 160/90 mmHg  Pulse 90  Ht 5' 2"  (1.575 m)  Wt 234 lb 6.4 oz (106.323 kg)  BMI 42.86 kg/m2 , BMI Body mass index is 42.86 kg/(m^2). GEN: Well nourished, well developed, in no acute distress HEENT: normal Neck: no JVD, carotid bruits, or masses Cardiac: Irregularly irregular no murmurs, rubs, or gallops,no edema  Respiratory:  clear to auscultation bilaterally, normal work of breathing GI: soft, nontender, nondistended, + BS MS: no deformity or atrophy Skin: warm and dry, no rash Neuro:  Strength and sensation are intact Psych: euthymic mood, full affect   EKG:  EKG is not ordered today.    Recent Labs: 02/12/2014: Hemoglobin 12.9; Platelets 131* 02/18/2014: BUN 25*; Creatinine, Ser 1.0; Potassium 4.1; Sodium 146*    Lipid Panel No results found for: CHOL, TRIG, HDL, CHOLHDL, VLDL, LDLCALC, LDLDIRECT    Wt Readings from Last 3 Encounters:  09/07/14 234 lb 6.4 oz (106.323 kg)  06/07/14 232 lb (105.235 kg)  03/29/14 236 lb (107.049 kg)         ASSESSMENT AND PLAN:  1. paroxysmal atrial fibrillation on long-term anticoagulation. Doing well on rate control and long-term anticoagulation. 2. prior pulmonary emboli, on warfarin 3. essential hypertension with intolerance to metoprolol. 4. Chronic diastolic CHF   Current medicines are reviewed at length with the patient today.  The  patient does not have concerns regarding medicines.  The  following changes have been made:  no change  Labs/ tests ordered today include:  No orders of the defined types were placed in this encounter.    Continue current medication.  Recheck in 4 months for office visit and EKG Signed, Darlin Coco MD 09/07/2014 6:22 PM    [address] [place], [place], [place]  [address] Phone: [phone]; Fax: [phone]

## 2014-09-08 DIAGNOSIS — M19072 Primary osteoarthritis, left ankle and foot: Secondary | ICD-10-CM | POA: Diagnosis not present

## 2014-09-08 DIAGNOSIS — M79672 Pain in left foot: Secondary | ICD-10-CM | POA: Diagnosis not present

## 2014-09-08 DIAGNOSIS — M2550 Pain in unspecified joint: Secondary | ICD-10-CM | POA: Diagnosis not present

## 2014-09-21 ENCOUNTER — Ambulatory Visit (INDEPENDENT_AMBULATORY_CARE_PROVIDER_SITE_OTHER): Payer: Medicare Other | Admitting: *Deleted

## 2014-09-21 ENCOUNTER — Ambulatory Visit (INDEPENDENT_AMBULATORY_CARE_PROVIDER_SITE_OTHER): Payer: Medicare Other | Admitting: Podiatry

## 2014-09-21 DIAGNOSIS — I2699 Other pulmonary embolism without acute cor pulmonale: Secondary | ICD-10-CM

## 2014-09-21 DIAGNOSIS — L84 Corns and callosities: Secondary | ICD-10-CM

## 2014-09-21 DIAGNOSIS — I4891 Unspecified atrial fibrillation: Secondary | ICD-10-CM

## 2014-09-21 LAB — POCT INR: INR: 2.1

## 2014-09-21 NOTE — Progress Notes (Signed)
   Subjective:    Patient ID: [name], female    DOB: 07/19/1934, 80 y.o.   MRN: [id number]  HPI This patient presents for ongoing management of pre-ulcerative in ulcerative plantar skin lesion sub-left first MPJ. She has periods where the this pre-ulcerative calluses become a superficial ulcer, however, is currently being managed by repetitive debridement at approximately 4 week intervals. In addition patient is wearing custom foot orthotic with an additional felt pad to further offload the plantar first MPJ area   Review of Systems  All other systems reviewed and are negative.      Objective:   Physical Exam  Orientated 3 patient presents with her husband treatment room  The plantar left first MPJ has large circular plantar callus with occasional punctate dried blood within the callus. After debridement the callus remains closed. There is no surrounding erythema, edema, warmth, drainage      Assessment & Plan:   Assessment: Pre-ulcerative plantar callus sub-left first MPJ  Plan: The plantar pre-ulcerative callus was debrided Patient will maintain custom foot orthotics  Reappoint 5 weeks

## 2014-09-26 DIAGNOSIS — I10 Essential (primary) hypertension: Secondary | ICD-10-CM | POA: Diagnosis not present

## 2014-09-26 DIAGNOSIS — M159 Polyosteoarthritis, unspecified: Secondary | ICD-10-CM | POA: Diagnosis not present

## 2014-10-03 DIAGNOSIS — L300 Nummular dermatitis: Secondary | ICD-10-CM | POA: Diagnosis not present

## 2014-10-03 DIAGNOSIS — L308 Other specified dermatitis: Secondary | ICD-10-CM | POA: Diagnosis not present

## 2014-10-04 ENCOUNTER — Ambulatory Visit: Payer: Medicare Other | Admitting: Family

## 2014-10-04 ENCOUNTER — Other Ambulatory Visit (HOSPITAL_COMMUNITY): Payer: Medicare Other

## 2014-10-07 ENCOUNTER — Other Ambulatory Visit: Payer: Self-pay

## 2014-10-07 DIAGNOSIS — Z1231 Encounter for screening mammogram for malignant neoplasm of breast: Secondary | ICD-10-CM

## 2014-10-10 ENCOUNTER — Encounter: Payer: Self-pay | Admitting: Family

## 2014-10-11 ENCOUNTER — Encounter: Payer: Self-pay | Admitting: Family

## 2014-10-11 ENCOUNTER — Ambulatory Visit (HOSPITAL_COMMUNITY)
Admission: RE | Admit: 2014-10-11 | Discharge: 2014-10-11 | Disposition: A | Discharge status: Home / Self Care | Payer: Medicare Other | Source: Ambulatory Visit | Attending: Family | Admitting: Family

## 2014-10-11 ENCOUNTER — Ambulatory Visit (INDEPENDENT_AMBULATORY_CARE_PROVIDER_SITE_OTHER): Payer: Medicare Other | Admitting: Family

## 2014-10-11 VITALS — BP 138/78 | HR 80 | Temp 97.8°F | Resp 14 | Ht 62.0 in | Wt 233.0 lb

## 2014-10-11 DIAGNOSIS — I6521 Occlusion and stenosis of right carotid artery: Secondary | ICD-10-CM

## 2014-10-11 DIAGNOSIS — I6522 Occlusion and stenosis of left carotid artery: Secondary | ICD-10-CM

## 2014-10-11 DIAGNOSIS — I6523 Occlusion and stenosis of bilateral carotid arteries: Secondary | ICD-10-CM | POA: Insufficient documentation

## 2014-10-11 DIAGNOSIS — I10 Essential (primary) hypertension: Secondary | ICD-10-CM | POA: Insufficient documentation

## 2014-10-11 NOTE — Patient Instructions (Signed)
Stroke Prevention Some medical conditions and behaviors are associated with an increased chance of having a stroke. You may prevent a stroke by making healthy choices and managing medical conditions. HOW CAN I REDUCE MY RISK OF HAVING A STROKE?   Stay physically active. Get at least 30 minutes of activity on most or all days.  Do not smoke. It may also be helpful to avoid exposure to secondhand smoke.  Limit alcohol use. Moderate alcohol use is considered to be:  No more than 2 drinks per day for men.  No more than 1 drink per day for nonpregnant women.  Eat healthy foods. This involves:  Eating 5 or more servings of fruits and vegetables a day.  Making dietary changes that address high blood pressure (hypertension), high cholesterol, diabetes, or obesity.  Manage your cholesterol levels.  Making food choices that are high in fiber and low in saturated fat, trans fat, and cholesterol may control cholesterol levels.  Take any prescribed medicines to control cholesterol as directed by your health care provider.  Manage your diabetes.  Controlling your carbohydrate and sugar intake is recommended to manage diabetes.  Take any prescribed medicines to control diabetes as directed by your health care provider.  Control your hypertension.  Making food choices that are low in salt (sodium), saturated fat, trans fat, and cholesterol is recommended to manage hypertension.  Take any prescribed medicines to control hypertension as directed by your health care provider.  Maintain a healthy weight.  Reducing calorie intake and making food choices that are low in sodium, saturated fat, trans fat, and cholesterol are recommended to manage weight.  Stop drug abuse.  Avoid taking birth control pills.  Talk to your health care provider about the risks of taking birth control pills if you are over 35 years old, smoke, get migraines, or have ever had a blood clot.  Get evaluated for sleep  disorders (sleep apnea).  Talk to your health care provider about getting a sleep evaluation if you snore a lot or have excessive sleepiness.  Take medicines only as directed by your health care provider.  For some people, aspirin or blood thinners (anticoagulants) are helpful in reducing the risk of forming abnormal blood clots that can lead to stroke. If you have the irregular heart rhythm of atrial fibrillation, you should be on a blood thinner unless there is a good reason you cannot take them.  Understand all your medicine instructions.  Make sure that other conditions (such as anemia or atherosclerosis) are addressed. SEEK IMMEDIATE MEDICAL CARE IF:   You have sudden weakness or numbness of the face, arm, or leg, especially on one side of the body.  Your face or eyelid droops to one side.  You have sudden confusion.  You have trouble speaking (aphasia) or understanding.  You have sudden trouble seeing in one or both eyes.  You have sudden trouble walking.  You have dizziness.  You have a loss of balance or coordination.  You have a sudden, severe headache with no known cause.  You have new chest pain or an irregular heartbeat. Any of these symptoms may represent a serious problem that is an emergency. Do not wait to see if the symptoms will go away. Get medical help at once. Call your local emergency services (911 in [place] Do not drive yourself to the hospital. Document Released: 03/07/2004 Document Revised: 06/14/2013 Document Reviewed: 07/31/2012 [employer] Patient Information 2015 [employer]. This information is not intended to replace advice given   to you by your health care provider. Make sure you discuss any questions you have with your health care provider.  

## 2014-10-11 NOTE — Progress Notes (Addendum)
Established Carotid Patient   History of Present Illness  [name] is a 80 y.o. female  patient of Dr. Donnetta Hutching seen for left-sided carotid stenosis with a known right ICA occlusion.  She returns today for follow up.  Patient has not had previous carotid artery intervention.  She denies any remote or recent stroke of TIA symptoms.  Denies steal symptoms.  Does not have claudication symptoms.  Tires easily since she had the PE.  Has mild neuropathy in both feet for years, attributes to back issues.  She had detatched retina repaired Dec. 2, 2014, had cataract surgery July, 2015, can see much better, no more headaches. She has Crohn's Disease and was evaluted for occult GI bleed, is anemic, states her Hg is increasing with oral iron. April, 2014 she had a left total shoulder replaced, then developed PE's, she now takes coumadin.  Her right shoulder has issues.  She has had lumbar spine surgery.  She has severe generalized OA, has affected her feet, has trouble walking. She was receiving physical therapy, is doing some walking. Is also getting some treatments to her feet (OA pain) that helps with pain and is able to walk more.   She also has fibromyalgia which is worse lately. She states her blood pressure usually runs 124-132/ 68-72. She states she has proven white coat syndrome.  She had an exacerbation of her CHF January 2016, observed in ED, did not need to be admitted.   Pt Diabetic: No   Pt smoker: former smoker, quit over 25 years ago  Pt meds include:  Statin : No: cannot tolerate  Betablocker: Yes  ASA: No  Other anticoagulants/antiplatelets: coumadin  Past Medical History  Diagnosis Date  . Hypertension   . Gout   . Tinnitus   . Acid reflux disease   . Pulmonary embolism 2011    a. Hx of PE in 02/2009 after R hip surgery, venous dopplers negative, long-term Coumadin.  . Vertigo   . Crohn disease   . Long term current use of anticoagulant   . GERD  (gastroesophageal reflux disease)   . Recurrent upper respiratory infection (URI)     BRONCHITIS FEB 2013--SLIGHT COUGH NON-PRODUCTIVE NOW  . H/O hiatal hernia   . PVC (premature ventricular contraction)     PT STATES HX OF PVC'S ON EKG  . Peripheral vascular disease     KNOWN RIGHT INTERNAL CAROTID ARTERY OCCLUSION (NO STROKE)  --40 TO 59% STENOSIS LEFT ICA-FOLLOWED BY DR. EARLY WITH DOPPLER STUDY EVERY 6 MONTHS  . Fibromyalgia   . High triglycerides   . Obstructive sleep apnea on CPAP   . Crohn's disease   . History of stomach ulcers 1950's  . Osteoarthritis     PAIN AND OA LEF T HIP AND BOTH SHOULDERS ARE BONE ON BONE AND PAINFUL  . Recurrent UTI (urinary tract infection)     "on daily medicine" (05/14/2012)  . Carotid artery occlusion   . Detached retina   . Atrial fibrillation   . Fibromyalgia   . Chronic diastolic CHF (congestive heart failure)     Hypertensive heart disease 02-12-14    Social History Social History  Substance Use Topics  . Smoking status: Former Smoker -- 0.50 packs/day for 15 years    Types: Cigarettes    Quit date: 02/11/1986  . Smokeless tobacco: Never Used  . Alcohol Use: 0.0 oz/week    0 Standard drinks or equivalent per week     Comment: 05/14/2012 "have 2-3  drinks/yr" maybe    Family History Family History  Problem Relation Age of Onset  . Heart disease Father     Heart Disease before age 9  . Heart attack Father 67  . Hypertension Father   . Peripheral vascular disease Father     Right  leg amputation  . Heart disease Mother     AAA  . Stroke Mother   . Aneurysm Mother   . Hypertension Mother   . Hypertension Brother   . Heart disease Brother     Heart Disease before age 58  . Hyperlipidemia Brother   . Heart attack Brother   . Hypertension Brother   . Diabetes Paternal Grandfather   . Diabetes Son     Surgical History Past Surgical History  Procedure Laterality Date  . Femur fracture surgery Right 02/21/09    Open reduction  internal fixation of right periprosthetic  femur fracture utilizing Zimmer cables times fiv  . Hammer toe surgery Left 10/25/08    "toe next to big to" (05/14/2012)  . Back surgery  10/29/06    Central and foraminal decompression L3-L4, L4-L5, and L5-S1 with inspection of L4-L5 and L5-S1 disc on the right  . Knee arthroscopy Right 07/26/05  . Total hip arthroplasty Right 12/08/02    Osteonics total hip replacement  . Excision morton's neuroma Right 05/20/00    "foot" (05/14/2012)  . Total hip arthroplasty  06/17/2011    Procedure: TOTAL HIP ARTHROPLASTY;  Surgeon: Gearlean Alf, MD;  Location: [facility];  Service: Orthopedics;  Laterality: Left;  . Joint replacement  2009    Right Hip replacement  . Joint replacement  2012    Right knee replacement  . Joint replacement  06/17/11    Left Hip replacement  . Fracture surgery  2011    Right Femur Fx  . Total shoulder arthroplasty Left 05/14/2012  . Tonsillectomy and adenoidectomy  1958?  [name] Kitchen Appendectomy  1950's  . Cholecystectomy  ~ 1988  . Vaginal hysterectomy  1971  . Dilation and curettage of uterus      "several; from miscarriages" (05/14/2012)  . Decompressive lumbar laminectomy level 3  ~ 2002  . Replacement total knee Right 02/19/2010  . Cardiac catheterization  1970's    "maybe 2" (05/14/2012)  . Cardiac catheterization    . Cataract extraction w/ intraocular lens implant Right 2009  . Total shoulder arthroplasty Left 05/14/2012    Procedure: TOTAL SHOULDER ARTHROPLASTY;  Surgeon: Marin Shutter, MD;  Location: [facility];  Service: Orthopedics;  Laterality: Left;  [name] Kitchen Eye surgery Left 2014    Detached retina  . Eye surgery Left August 23, 2013    Cataract  . Spine surgery      Allergies  Allergen Reactions  . Ciprofloxacin     RASH  . Codeine     NAUSEA  . Cymbalta [Duloxetine Hcl]     Nausea VOMITING AND ABDOMINAL PAIN, HEADACHE, JUST ABOUT EVERY SIDE EFFECT THE DRUG HAS  . Diltiazem Cd [Diltiazem Hcl Er Beads]     Heavy legs, cough  .  Metoprolol     Legs like cement  . Nortriptyline     Nightmares  . Penicillins     RASH  & ITCHING   --PT STATES SHE CAN TAKE KEFLEX PO AND IV CEPHALOSPORINS  . Statins     Leg cramps  . Tetanus Toxoids     SWELLING, REDNESS  WHOLE ARM  . Lyrica [Pregabalin] Diarrhea, Nausea And Vomiting  and Rash    Current Outpatient Prescriptions  Medication Sig Dispense Refill  . acetaminophen (TYLENOL) 500 MG tablet Take 500 mg by mouth daily as needed for headache.    . allopurinol (ZYLOPRIM) 300 MG tablet Take 300 mg by mouth Daily.     [name] Kitchen amLODipine (NORVASC) 5 MG tablet Take 1 tablet (5 mg total) by mouth daily. 30 tablet 6  . calcium citrate-vitamin D (CITRACAL+D) 315-200 MG-UNIT per tablet Take 1 tablet by mouth daily.    . CELEBREX 200 MG capsule Take 200 mg by mouth Daily.    . cetirizine (ZYRTEC) 10 MG tablet Take 10 mg by mouth daily.    . cholecalciferol (VITAMIN D) 1000 UNITS tablet Take 4,000 Units by mouth daily.     [name] Kitchen diltiazem (CARDIZEM) 60 MG tablet Take 60 mg by mouth every 6 (six) hours as needed (symptomatic AFIB).     [name] Kitchen diphenhydrAMINE (BENADRYL) 25 mg capsule Take 25 mg by mouth at bedtime as needed for sleep. For sleep    . dorzolamide-timolol (COSOPT) 22.3-6.8 MG/ML ophthalmic solution Place 1 drop into both eyes 2 (two) times daily.     . fenofibrate 160 MG tablet Take 160 mg by mouth every evening.     . furosemide (LASIX) 20 MG tablet 1/2 TABLET BY MOUTH DAILY 45 tablet 3  . HYDROcodone-acetaminophen (NORCO) 10-325 MG per tablet Take 1 tablet by mouth every 6 (six) hours as needed (pain).     . indapamide (LOZOL) 1.25 MG tablet Take 1.25 mg by mouth every morning.    [name] Kitchen ketoconazole (NIZORAL) 2 % cream Apply 1 application topically 2 (two) times daily as needed for irritation.     . Magnesium 400 MG CAPS Take 400 mg by mouth daily.     . Multiple Vitamins-Minerals (MULTIVITAMIN WITH MINERALS) tablet Take 1 tablet by mouth daily.    . nebivolol (BYSTOLIC) 10 MG tablet Take  1 tablet (10 mg total) by mouth daily. 30 tablet 6  . nitrofurantoin (MACRODANTIN) 50 MG capsule Take 50 mg by mouth daily.     [name] Kitchen omeprazole (PRILOSEC) 40 MG capsule Take 40 mg by mouth daily with supper.     . vitamin C (ASCORBIC ACID) 500 MG tablet Take 500 mg by mouth daily.    [name] Kitchen warfarin (COUMADIN) 2.5 MG tablet TAKE (1) TABLET AS DIRECTED. 35 tablet 3  . [DISCONTINUED] amitriptyline (ELAVIL) 10 MG tablet Take 10 mg by mouth at bedtime.      No current facility-administered medications for this visit.    Review of Systems : See HPI for pertinent positives and negatives.  Physical Examination  Filed Vitals:   10/11/14 1320 10/11/14 1334  BP: 130/92 138/78  Pulse: 82 80  Temp: 97.8 F (36.6 C)   Resp: 14   Height: 5' 2"  (1.575 m)   Weight: 233 lb (105.688 kg)   SpO2: 95%    Body mass index is 42.61 kg/(m^2).  General: WDWN morbidly obese female in NAD  GAIT: slow, deliberate, using cane Eyes: PERRLA  Pulmonary: CTAB, Negative Rales, Negative rhonchi, & Negative wheezing.  Cardiac: Irregular Rhythm, no detected murmur  VASCULAR EXAM  Carotid Bruits  Left  Right    Positive  Negative    Radial pulses are 2+ palpable and equal.   LE Pulses  LEFT  RIGHT   POPLITEAL  not palpable  not palpable    Bilateral DP pulses are palpable. Graduated compression hose are in place.  Gastrointestinal: soft, nontender, BS WNL,  no r/g, no palpable masses.  Musculoskeletal: Negative muscle atrophy/wasting. M/S 4/5 throughout, Extremities without ischemic changes. Unable to lift right shoulder as high as left. Neurologic: A&O X 3; Appropriate Affect, Speech is normal  CN 2-12 intact, Pain and light touch intact in extremities, Motor exam as listed above.               Non-Invasive Vascular Imaging CAROTID DUPLEX 10/11/2014   CEREBROVASCULAR DUPLEX EVALUATION    INDICATION: Carotid artery disease    PREVIOUS INTERVENTION(S): None     DUPLEX EXAM: Carotid duplex    RIGHT  LEFT  Peak Systolic Velocities (cm/s) End Diastolic Velocities (cm/s) Plaque LOCATION Peak Systolic Velocities (cm/s) End Diastolic Velocities (cm/s) Plaque  33 .4  CCA PROXIMAL 78 13 -  44 .7  CCA MID 86 14 HM  31 1.0 HT CCA DISTAL 76 15 HT  340 28 HT ECA 294 20 HT  Occluded - HT ICA PROXIMAL 272 53 CP  Occluded - HT ICA MID 115 28 -  Occluded - HT ICA DISTAL 86 25 -    N/A ICA / CCA Ratio (PSV) 3.1  Antegrade Vertebral Flow Antegrade  357 Brachial Systolic Pressure (mmHg) 017  Triphasic Brachial Artery Waveforms Triphasic    Plaque Morphology:  HM = Homogeneous, HT = Heterogeneous, CP = Calcific Plaque, SP = Smooth Plaque, IP = Irregular Plaque     ADDITIONAL FINDINGS:     IMPRESSION: 1. Known occlusion of the right internal carotid artery 2. Right external carotid artery stenosis. 3. 40 - 59% left internal carotid artery stenosis,  upper end of range, velocity may be overestimated due to compensatory flow from the known right internal carotid artery as well as the sharp take off of the left internal carotid artery.    Compared to the previous exam:  No significant change     Assessment: LAYA LETENDRE is a 80 y.o. female who is seen for left internal carotid artery stenosis with a known right ICA occlusion. She has no history of stroke or TIA. Today's carotid Duplex suggests known occlusion of the right internal carotid artery and 40 - 59% left internal carotid artery stenosis, upper end of range, velocity may be overestimated due to compensatory flow from the known right internal carotid artery as well as the sharp take off of the left internal carotid artery.  No significant change from prior Duplex exam six months ago.   Plan: Follow-up in 6 months with Carotid Duplex.   I discussed in depth with the patient the nature of atherosclerosis, and emphasized the importance of maximal medical management including strict control of blood  pressure, blood glucose, and lipid levels, obtaining regular exercise, and continued cessation of smoking.  The patient is aware that without maximal medical management the underlying atherosclerotic disease process will progress, limiting the benefit of any interventions. The patient was given information about stroke prevention and what symptoms should prompt the patient to seek immediate medical care. Thank you for allowing [place] to participate in this patient's care.  Clemon Chambers, RN, MSN, FNP-C [facility] Office: [phone]  Clinic Physician: Early  10/11/2014 1:47 PM

## 2014-10-11 NOTE — Addendum Note (Signed)
Addended by: Dorthula Rue L on: 10/11/2014 03:45 PM   Modules accepted: Orders

## 2014-10-12 ENCOUNTER — Ambulatory Visit (INDEPENDENT_AMBULATORY_CARE_PROVIDER_SITE_OTHER): Payer: Medicare Other | Admitting: *Deleted

## 2014-10-12 DIAGNOSIS — I2699 Other pulmonary embolism without acute cor pulmonale: Secondary | ICD-10-CM | POA: Diagnosis not present

## 2014-10-12 DIAGNOSIS — I4891 Unspecified atrial fibrillation: Secondary | ICD-10-CM

## 2014-10-12 LAB — POCT INR: INR: 1.9

## 2014-10-21 ENCOUNTER — Ambulatory Visit (INDEPENDENT_AMBULATORY_CARE_PROVIDER_SITE_OTHER): Payer: Medicare Other | Admitting: *Deleted

## 2014-10-21 ENCOUNTER — Ambulatory Visit (INDEPENDENT_AMBULATORY_CARE_PROVIDER_SITE_OTHER): Payer: Medicare Other | Admitting: Cardiology

## 2014-10-21 ENCOUNTER — Encounter: Payer: Self-pay | Admitting: Cardiology

## 2014-10-21 VITALS — BP 126/72 | HR 102 | Ht 62.0 in | Wt 231.8 lb

## 2014-10-21 DIAGNOSIS — I6523 Occlusion and stenosis of bilateral carotid arteries: Secondary | ICD-10-CM | POA: Diagnosis not present

## 2014-10-21 DIAGNOSIS — I2699 Other pulmonary embolism without acute cor pulmonale: Secondary | ICD-10-CM

## 2014-10-21 DIAGNOSIS — I4891 Unspecified atrial fibrillation: Secondary | ICD-10-CM

## 2014-10-21 DIAGNOSIS — I119 Hypertensive heart disease without heart failure: Secondary | ICD-10-CM | POA: Diagnosis not present

## 2014-10-21 DIAGNOSIS — I5033 Acute on chronic diastolic (congestive) heart failure: Secondary | ICD-10-CM | POA: Diagnosis not present

## 2014-10-21 LAB — POCT INR: INR: 2.3

## 2014-10-21 MED ORDER — INDAPAMIDE 2.5 MG PO TABS: 2.5000 mg | ORAL_TABLET | ORAL | Status: DC

## 2014-10-21 NOTE — Patient Instructions (Signed)
Medication Instructions:  CHANGE FUROSEMIDE TO AS NEEDED   INCREASE LOZOL TO 2.5 MG DAILY  Labwork: NONE  Testing/Procedures: NONE  Follow-Up: Keep your appointment as scheduled   Any Other Special Instructions Will Be Listed Below (If Applicable).

## 2014-10-21 NOTE — Progress Notes (Signed)
Cardiology Office Note   Date:  10/21/2014   ID:  [name], DOB July 18, 1934, MRN [id number]  PCP:  Horatio Pel, MD  Cardiologist: Darlin Coco MD  No chief complaint on file.     History of Present Illness: [name] is a 80 y.o. female who presents for work in office visit.   This pleasant 80 year old woman is seen for followup office visit. She was admitted to the hospital in February 2015 because of new onset atrial fibrillation. She was already on long-term Coumadin. Diltiazem for rate control was added to her regimen in the hospital. The patient has a past history of pulmonary emboli and is on long-term Coumadin. Her INR has been stable. She has a history of essential hypertension and a history of right carotid artery occlusion. She has a past history of pulmonary emboli in January 2011 after right hip surgery and at that time venous Dopplers of the legs were negative for a source of embolization and she has remained on long-term Coumadin. 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 Donnetta Hutching. She does not have any significant coronary disease and she had cardiac catheterization in 1994 showing only mild plaque and she had a nuclear stress test in December 2011 showing no ischemia and her ejection fraction was 78%. Earlier this year she had left total shoulder replacement surgery by Dr. Onnie Graham. In October she saw pulmonary because of exertional dyspnea. She had a chest x-ray on 11/24/12 showing cardiomegaly. She had a VQ scan which was negative for new pulmonary emboli. The patient had an echocardiogram on 05/25/12 which showed an ejection fraction of 55-60% and no significant valve abnormalities.  The reason that the patient wanted to be seen was because she is miserable taking furosemide.  Even 10 mg seems to cause profound diuresis and at the same time it causes her stools to be loose and to  have some fecal incontinence every time she voids.  She has not been experiencing any recent peripheral edema.  She has been taking Lozol 1.25 mg daily. Past Medical History  Diagnosis Date  . Hypertension   . Gout   . Tinnitus   . Acid reflux disease   . Pulmonary embolism 2011    a. Hx of PE in 02/2009 after R hip surgery, venous dopplers negative, long-term Coumadin.  . Vertigo   . Crohn disease   . Long term current use of anticoagulant   . GERD (gastroesophageal reflux disease)   . Recurrent upper respiratory infection (URI)     BRONCHITIS FEB 2013--SLIGHT COUGH NON-PRODUCTIVE NOW  . H/O hiatal hernia   . PVC (premature ventricular contraction)     PT STATES HX OF PVC'S ON EKG  . Peripheral vascular disease     KNOWN RIGHT INTERNAL CAROTID ARTERY OCCLUSION (NO STROKE)  --40 TO 59% STENOSIS LEFT ICA-FOLLOWED BY DR. EARLY WITH DOPPLER STUDY EVERY 6 MONTHS  . Fibromyalgia   . High triglycerides   . Obstructive sleep apnea on CPAP   . Crohn's disease   . History of stomach ulcers 1950's  . Osteoarthritis     PAIN AND OA LEF T HIP AND BOTH SHOULDERS ARE BONE ON BONE AND PAINFUL  . Recurrent UTI (urinary tract infection)     "on daily medicine" (05/14/2012)  . Carotid artery occlusion   . Detached retina   . Atrial fibrillation   .  Fibromyalgia   . Chronic diastolic CHF (congestive heart failure)     Hypertensive heart disease 02-12-14    Past Surgical History  Procedure Laterality Date  . Femur fracture surgery Right 02/21/09    Open reduction internal fixation of right periprosthetic  femur fracture utilizing Zimmer cables times fiv  . Hammer toe surgery Left 10/25/08    "toe next to big to" (05/14/2012)  . Back surgery  10/29/06    Central and foraminal decompression L3-L4, L4-L5, and L5-S1 with inspection of L4-L5 and L5-S1 disc on the right  . Knee arthroscopy Right 07/26/05  . Total hip arthroplasty Right 12/08/02    Osteonics total hip replacement  . Excision morton's  neuroma Right 05/20/00    "foot" (05/14/2012)  . Total hip arthroplasty  06/17/2011    Procedure: TOTAL HIP ARTHROPLASTY;  Surgeon: Gearlean Alf, MD;  Location: [facility];  Service: Orthopedics;  Laterality: Left;  . Joint replacement  2009    Right Hip replacement  . Joint replacement  2012    Right knee replacement  . Joint replacement  06/17/11    Left Hip replacement  . Fracture surgery  2011    Right Femur Fx  . Total shoulder arthroplasty Left 05/14/2012  . Tonsillectomy and adenoidectomy  1958?  [name] Kitchen Appendectomy  1950's  . Cholecystectomy  ~ 1988  . Vaginal hysterectomy  1971  . Dilation and curettage of uterus      "several; from miscarriages" (05/14/2012)  . Decompressive lumbar laminectomy level 3  ~ 2002  . Replacement total knee Right 02/19/2010  . Cardiac catheterization  1970's    "maybe 2" (05/14/2012)  . Cardiac catheterization    . Cataract extraction w/ intraocular lens implant Right 2009  . Total shoulder arthroplasty Left 05/14/2012    Procedure: TOTAL SHOULDER ARTHROPLASTY;  Surgeon: Marin Shutter, MD;  Location: [facility];  Service: Orthopedics;  Laterality: Left;  [name] Kitchen Eye surgery Left 2014    Detached retina  . Eye surgery Left August 23, 2013    Cataract  . Spine surgery       Current Outpatient Prescriptions  Medication Sig Dispense Refill  . acetaminophen (TYLENOL) 500 MG tablet Take 500 mg by mouth daily as needed for headache.    . allopurinol (ZYLOPRIM) 300 MG tablet Take 300 mg by mouth Daily.     [name] Kitchen amLODipine (NORVASC) 5 MG tablet Take 1 tablet (5 mg total) by mouth daily. 30 tablet 6  . calcium citrate-vitamin D (CITRACAL+D) 315-200 MG-UNIT per tablet Take 1 tablet by mouth daily.    . CELEBREX 200 MG capsule Take 200 mg by mouth Daily.    . cetirizine (ZYRTEC) 10 MG tablet Take 10 mg by mouth daily.    . cholecalciferol (VITAMIN D) 1000 UNITS tablet Take 4,000 Units by mouth daily.     . clobetasol cream (TEMOVATE) 4.09 % Apply 1 application topically 2 (two) times  daily.    [name] Kitchen diltiazem (CARDIZEM) 60 MG tablet Take 60 mg by mouth every 6 (six) hours as needed (symptomatic AFIB).     [name] Kitchen diphenhydrAMINE (BENADRYL) 25 mg capsule Take 25 mg by mouth at bedtime as needed for sleep. For sleep    . dorzolamide-timolol (COSOPT) 22.3-6.8 MG/ML ophthalmic solution Place 1 drop into both eyes 2 (two) times daily.     . fenofibrate 160 MG tablet Take 160 mg by mouth every evening.     . furosemide (LASIX) 20 MG tablet Take 20 mg by mouth  as needed for fluid.    [name] Kitchen HYDROcodone-acetaminophen (NORCO) 10-325 MG per tablet Take 1 tablet by mouth every 6 (six) hours as needed (pain).     . indapamide (LOZOL) 2.5 MG tablet Take 1 tablet (2.5 mg total) by mouth every morning. 90 tablet 3  . ketoconazole (NIZORAL) 2 % cream Apply 1 application topically 2 (two) times daily as needed for irritation.     . Magnesium 400 MG CAPS Take 400 mg by mouth daily.     . Multiple Vitamins-Minerals (MULTIVITAMIN WITH MINERALS) tablet Take 1 tablet by mouth daily.    . nebivolol (BYSTOLIC) 10 MG tablet Take 1 tablet (10 mg total) by mouth daily. 30 tablet 6  . nitrofurantoin (MACRODANTIN) 50 MG capsule Take 50 mg by mouth daily.     [name] Kitchen omeprazole (PRILOSEC) 40 MG capsule Take 40 mg by mouth daily with supper.     . vitamin C (ASCORBIC ACID) 500 MG tablet Take 500 mg by mouth daily.    [name] Kitchen warfarin (COUMADIN) 2.5 MG tablet Take 2.5 mg by mouth daily. Take by mouth as directed by the coumadin clinic    . [DISCONTINUED] amitriptyline (ELAVIL) 10 MG tablet Take 10 mg by mouth at bedtime.      No current facility-administered medications for this visit.    Allergies:   Ciprofloxacin; Codeine; Cymbalta; Diltiazem cd; Metoprolol; Nortriptyline; Penicillins; Statins; Tetanus toxoids; and Lyrica    Social History:  The patient  reports that she quit smoking about 28 years ago. Her smoking use included Cigarettes. She has a 7.5 pack-year smoking history. She has never used smokeless tobacco. She  reports that she drinks alcohol. She reports that she does not use illicit drugs.   Family History:  The patient's family history includes Aneurysm in her mother; Diabetes in her paternal grandfather and son; Heart attack in her brother; Heart attack (age of onset: 34) in her father; Heart disease in her brother, father, and mother; Hyperlipidemia in her brother; Hypertension in her brother, brother, father, and mother; Peripheral vascular disease in her father; Stroke in her mother.    ROS:  Please see the history of present illness.   Otherwise, review of systems are positive for none.   All other systems are reviewed and negative.    PHYSICAL EXAM: VS:  BP 126/72 mmHg  Pulse 102  Ht 5' 2"  (1.575 m)  Wt 231 lb 12.8 oz (105.144 kg)  BMI 42.39 kg/m2 , BMI Body mass index is 42.39 kg/(m^2). GEN: Well nourished, well developed, in no acute distress HEENT: normal Neck: no JVD, carotid bruits, or masses Cardiac: Irregularly irregular; no murmurs, rubs, or gallops,no edema  Respiratory:  clear to auscultation bilaterally, normal work of breathing GI: soft, nontender, nondistended, + BS MS: no deformity or atrophy Skin: warm and dry, no rash Neuro:  Strength and sensation are intact Psych: euthymic mood, full affect   EKG:  EKG is not ordered today.    Recent Labs: 02/12/2014: Hemoglobin 12.9; Platelets 131* 02/18/2014: BUN 25*; Creatinine, Ser 1.0; Potassium 4.1; Sodium 146*    Lipid Panel No results found for: CHOL, TRIG, HDL, CHOLHDL, VLDL, LDLCALC, LDLDIRECT    Wt Readings from Last 3 Encounters:  10/21/14 231 lb 12.8 oz (105.144 kg)  10/11/14 233 lb (105.688 kg)  09/07/14 234 lb 6.4 oz (106.323 kg)         ASSESSMENT AND PLAN:  1. paroxysmal atrial fibrillation on long-term anticoagulation. Doing well on rate control and long-term anticoagulation. 2.  prior pulmonary emboli, on warfarin 3. essential hypertension with intolerance to metoprolol. 4. Chronic diastolic  CHF.  She does not tolerate Lasix on a maintenance schedule.  She would like to be able to use it just when necessary for extreme peripheral edema   Current medicines are reviewed at length with the patient today.  The patient has concerns regarding medicines.  The following changes have been made:  Stop scheduled dose of furosemide.  Uses only when necessary for extreme peripheral edema.  Increase Lozol back to its previous dose of 2.5 mg daily.  Labs/ tests ordered today include:  No orders of the defined types were placed in this encounter.    Disposition: She will keep her previously scheduled visit in October or November.  Berna Spare MD 10/21/2014 5:36 PM    [address], [place], [place]  [address] Phone: [phone]; Fax: [phone]

## 2014-10-26 ENCOUNTER — Encounter: Payer: Self-pay | Admitting: Podiatry

## 2014-10-26 ENCOUNTER — Ambulatory Visit (INDEPENDENT_AMBULATORY_CARE_PROVIDER_SITE_OTHER): Payer: Medicare Other | Admitting: Podiatry

## 2014-10-26 DIAGNOSIS — L84 Corns and callosities: Secondary | ICD-10-CM

## 2014-10-27 NOTE — Progress Notes (Signed)
Patient ID: [name], female   DOB: 10/04/1934, 80 y.o.   MRN: [id number]  Subjective: This patient presents for ongoing debridement of pre-ulcerative in ulcerative plantar skin lesion on the left foot. She has periods where this pre-ulcerative calluses become a superficial ulceration, however, is currently managed by repetitive debridement at approximately 4 week intervals. In addition patient is wearing custom foot orthotic with an additional felt pad to further offload the plantar left first MPJ area  Objective: Orientated 3 patient presents with her husband treatment room The plantar left first MPJ has well-organized circular callus with occasional dried blood with in the area. There is no surrounding erythema, edema or drainage noted  Assessment: Pre-ulcerative plantar callus sub-left first MPJ  Plan: Debridement of plantar callus 1 left Continue wearing custom foot orthotics Extend role for debridement to 5 weeks

## 2014-10-28 ENCOUNTER — Encounter: Payer: Self-pay | Admitting: Cardiology

## 2014-11-02 ENCOUNTER — Ambulatory Visit (INDEPENDENT_AMBULATORY_CARE_PROVIDER_SITE_OTHER): Payer: Medicare Other | Admitting: Podiatry

## 2014-11-02 ENCOUNTER — Encounter: Payer: Self-pay | Admitting: Podiatry

## 2014-11-02 VITALS — BP 139/77 | HR 77 | Temp 97.7°F | Resp 12

## 2014-11-02 DIAGNOSIS — L84 Corns and callosities: Secondary | ICD-10-CM | POA: Diagnosis not present

## 2014-11-02 DIAGNOSIS — Z23 Encounter for immunization: Secondary | ICD-10-CM | POA: Diagnosis not present

## 2014-11-02 NOTE — Patient Instructions (Signed)
Wear year custom shoe insoles with additional padding at all times Place an additional felt pad around the bleeding callus on the bottom the left foot on a daily basis

## 2014-11-03 NOTE — Progress Notes (Signed)
Patient ID: [name], female   DOB: [birth year], 80 y.o.   MRN: [id number]  Subjective: This patient presents today concerned about increasing bleeding she noted in the plantar callus on the plantar aspect the left first MPJ. Patient has a long history of pre-ulcerative callus as well as ulceration in this area. She presents at approximately form week intervals for repetitive debridement. She wears a custom foot orthotic with additional padding in that area to further offload the plantar first MPJ area. She was last seen on the visit of 10/26/2014  Objective: Orientated 3 patient presents with her husband treatment room  The plantar aspect left first MPJ has hemorrhagic callus that after debridement remains closed. There is no surrounding erythema, edema, drainage or warmth  Assessment: Pre-ulcerative plantar callus sub-first left MPJ without clinical sign of infection  Plan: Debride plantar callus and attach felt pad around the area I advised patient and her husband the areas still was closed and was pre-ulcerative. In addition to her custom foot orthotic as described above I recommended that she further attach a felt pad or foam pad directly to the plantar first MPJ to further offload the area.  Reappoint 3 weeks

## 2014-11-04 DIAGNOSIS — L308 Other specified dermatitis: Secondary | ICD-10-CM | POA: Diagnosis not present

## 2014-11-07 ENCOUNTER — Other Ambulatory Visit: Payer: Self-pay | Admitting: Cardiology

## 2014-11-07 DIAGNOSIS — N3020 Other chronic cystitis without hematuria: Secondary | ICD-10-CM | POA: Diagnosis not present

## 2014-11-09 ENCOUNTER — Ambulatory Visit (INDEPENDENT_AMBULATORY_CARE_PROVIDER_SITE_OTHER): Payer: Medicare Other | Admitting: *Deleted

## 2014-11-09 DIAGNOSIS — I2699 Other pulmonary embolism without acute cor pulmonale: Secondary | ICD-10-CM

## 2014-11-09 DIAGNOSIS — I4891 Unspecified atrial fibrillation: Secondary | ICD-10-CM | POA: Diagnosis not present

## 2014-11-09 LAB — POCT INR: INR: 3.5

## 2014-11-16 DIAGNOSIS — L84 Corns and callosities: Secondary | ICD-10-CM

## 2014-11-22 ENCOUNTER — Ambulatory Visit (INDEPENDENT_AMBULATORY_CARE_PROVIDER_SITE_OTHER): Payer: Medicare Other | Admitting: Podiatry

## 2014-11-22 ENCOUNTER — Ambulatory Visit
Admission: RE | Admit: 2014-11-22 | Discharge: 2014-11-22 | Disposition: A | Discharge status: Home / Self Care | Payer: Medicare Other | Source: Ambulatory Visit

## 2014-11-22 DIAGNOSIS — L84 Corns and callosities: Secondary | ICD-10-CM

## 2014-11-22 DIAGNOSIS — Z1231 Encounter for screening mammogram for malignant neoplasm of breast: Secondary | ICD-10-CM | POA: Diagnosis not present

## 2014-11-22 NOTE — Patient Instructions (Signed)
Wear custom foot orthotics in athletic style shoes on a continuous daily basis

## 2014-11-22 NOTE — Progress Notes (Signed)
Patient ID: [name], female   DOB: 07-16-1934, 80 y.o.   MRN: [id number]  Subjective: This patient presents for scheduled visit again for ongoing treatment for pre-ulcerative plantar callus on the plantar aspect left first MPJ. Patient has a long history of pre-ulcerative callus and occasional superficial ulceration in this area. She wears custom foot orthotics with additional padding to further will offload the plantar left first MPJ  Objective: Orientated 3 patient presents with her husband treatment room  Plantar left first MPJ has bleeding callus sub-first MPJ left that remains closed after debridement. There is no surrounding erythema, edema, warmth or drainage  Assessment: Pre-ulcerative plantar callus sub-first left MPJ  Plan: Debridement of plantar callus 1 Patient will continue to wear custom foot orthotics with additional padding to offload the first MPJ  Reappoint 3 weeks

## 2014-11-24 ENCOUNTER — Ambulatory Visit (INDEPENDENT_AMBULATORY_CARE_PROVIDER_SITE_OTHER): Payer: Medicare Other | Admitting: *Deleted

## 2014-11-24 DIAGNOSIS — I4891 Unspecified atrial fibrillation: Secondary | ICD-10-CM | POA: Diagnosis not present

## 2014-11-24 DIAGNOSIS — I2699 Other pulmonary embolism without acute cor pulmonale: Secondary | ICD-10-CM

## 2014-11-24 LAB — POCT INR: INR: 4.1

## 2014-11-30 ENCOUNTER — Ambulatory Visit: Payer: Medicare Other | Admitting: Podiatry

## 2014-12-05 DIAGNOSIS — M79671 Pain in right foot: Secondary | ICD-10-CM | POA: Diagnosis not present

## 2014-12-05 DIAGNOSIS — G8929 Other chronic pain: Secondary | ICD-10-CM | POA: Diagnosis not present

## 2014-12-05 DIAGNOSIS — M19072 Primary osteoarthritis, left ankle and foot: Secondary | ICD-10-CM | POA: Diagnosis not present

## 2014-12-08 ENCOUNTER — Ambulatory Visit (INDEPENDENT_AMBULATORY_CARE_PROVIDER_SITE_OTHER): Payer: Medicare Other | Admitting: *Deleted

## 2014-12-08 DIAGNOSIS — I2699 Other pulmonary embolism without acute cor pulmonale: Secondary | ICD-10-CM

## 2014-12-08 DIAGNOSIS — I4891 Unspecified atrial fibrillation: Secondary | ICD-10-CM | POA: Diagnosis not present

## 2014-12-08 LAB — POCT INR: INR: 2.8

## 2014-12-13 ENCOUNTER — Encounter: Payer: Self-pay | Admitting: Podiatry

## 2014-12-13 ENCOUNTER — Ambulatory Visit (INDEPENDENT_AMBULATORY_CARE_PROVIDER_SITE_OTHER): Payer: Medicare Other | Admitting: Podiatry

## 2014-12-13 VITALS — BP 142/86 | HR 69 | Temp 97.6°F | Resp 12

## 2014-12-13 DIAGNOSIS — L84 Corns and callosities: Secondary | ICD-10-CM

## 2014-12-13 NOTE — Progress Notes (Signed)
Patient ID: [name], female   DOB: 1934-11-11, 80 y.o.   MRN: [id number]  Subjective: This patient presents for scheduled visit for ongoing debridement of pre-ulcerative plantar callus sub-left first MPJ Patient continues to wear flexible foot orthotic with additional padding to offload the first MPJ. Also, patient complaining of diffuse pain and arch area with weightbearing and is asking for shoe advice. She relates a history arthritic changes in the left foot  Objective: No open skin lesions bilaterally Bleeding callus plantar sub-left first MPJ Upon weight-bearing patient has mild past although planus  Assessment: Pre-ulcerative plantar callus sub-left first MPJ Hyperpronation and arthritic changes in left foot  Plan: Debrided pre-ulcerative plantar callus left Maintain athletic style shoes with modified custom insoles Wear existing ankle stabilizer left foot to see if additional leg foot support reduces symptoms symptoms  Reappoint 4 weeks

## 2014-12-13 NOTE — Patient Instructions (Signed)
Try wearing a ankle support on the right foot to see if it reduces any pain in your right foot and arch. Continue wearing athletic style shoes with shoe insert

## 2014-12-28 DIAGNOSIS — Z96612 Presence of left artificial shoulder joint: Secondary | ICD-10-CM | POA: Diagnosis not present

## 2014-12-28 DIAGNOSIS — Z96619 Presence of unspecified artificial shoulder joint: Secondary | ICD-10-CM | POA: Diagnosis not present

## 2014-12-28 DIAGNOSIS — Z471 Aftercare following joint replacement surgery: Secondary | ICD-10-CM | POA: Diagnosis not present

## 2015-01-03 ENCOUNTER — Ambulatory Visit: Payer: Medicare Other | Admitting: Podiatry

## 2015-01-04 ENCOUNTER — Other Ambulatory Visit: Payer: Self-pay | Admitting: Cardiology

## 2015-01-09 ENCOUNTER — Ambulatory Visit: Payer: Medicare Other | Admitting: Cardiology

## 2015-01-09 ENCOUNTER — Ambulatory Visit (INDEPENDENT_AMBULATORY_CARE_PROVIDER_SITE_OTHER): Payer: Medicare Other | Admitting: *Deleted

## 2015-01-09 DIAGNOSIS — I4891 Unspecified atrial fibrillation: Secondary | ICD-10-CM | POA: Diagnosis not present

## 2015-01-09 DIAGNOSIS — I2699 Other pulmonary embolism without acute cor pulmonale: Secondary | ICD-10-CM

## 2015-01-09 LAB — POCT INR: INR: 2.7

## 2015-01-10 ENCOUNTER — Encounter: Payer: Self-pay | Admitting: Podiatry

## 2015-01-10 ENCOUNTER — Ambulatory Visit (INDEPENDENT_AMBULATORY_CARE_PROVIDER_SITE_OTHER): Payer: Medicare Other | Admitting: Podiatry

## 2015-01-10 DIAGNOSIS — L84 Corns and callosities: Secondary | ICD-10-CM | POA: Diagnosis not present

## 2015-01-10 NOTE — Progress Notes (Signed)
Patient ID: [name], female   DOB: 02-04-1935, 80 y.o.   MRN: [id number]  Subjective: This patient presents for a scheduled visit for ongoing debridement of pre-ulcerative plantar callus on the plantar left first MPJ. Describes some tissue sloughing in this area since the last visit of 12/13/2014  Objective: Husband present treatment room No open skin lesions bilaterally Plantar callus sub-left first MPJ without any surrounding erythema, edema, drainage  Assessment: Pre-ulcerative plantar callus sub-left first MPJ Hyperpronation and arthritic change in left foot  Plan: Debrided pre-ulcerative plantar callus left without any bleeding  Maintain athletic style shoes with custom molded insoles with additional padding to offload plantar left first MPJ  Reappoint 4 weeks

## 2015-01-10 NOTE — Patient Instructions (Signed)
After showering and/or bathing use a pumice stone or a callus file and gently rub the callus on the ball area of your left foot and apply all-purpose skin lotion

## 2015-01-11 DIAGNOSIS — G4733 Obstructive sleep apnea (adult) (pediatric): Secondary | ICD-10-CM | POA: Diagnosis not present

## 2015-01-11 DIAGNOSIS — Z6841 Body Mass Index (BMI) 40.0 and over, adult: Secondary | ICD-10-CM | POA: Diagnosis not present

## 2015-01-11 DIAGNOSIS — M797 Fibromyalgia: Secondary | ICD-10-CM | POA: Diagnosis not present

## 2015-01-26 DIAGNOSIS — Z961 Presence of intraocular lens: Secondary | ICD-10-CM | POA: Diagnosis not present

## 2015-01-26 DIAGNOSIS — H26492 Other secondary cataract, left eye: Secondary | ICD-10-CM | POA: Diagnosis not present

## 2015-01-26 DIAGNOSIS — H40053 Ocular hypertension, bilateral: Secondary | ICD-10-CM | POA: Diagnosis not present

## 2015-01-27 ENCOUNTER — Encounter: Payer: Self-pay | Admitting: Cardiology

## 2015-01-27 ENCOUNTER — Ambulatory Visit (INDEPENDENT_AMBULATORY_CARE_PROVIDER_SITE_OTHER): Payer: Medicare Other | Admitting: Cardiology

## 2015-01-27 VITALS — BP 140/70 | HR 100 | Ht 62.0 in | Wt 232.0 lb

## 2015-01-27 DIAGNOSIS — I6523 Occlusion and stenosis of bilateral carotid arteries: Secondary | ICD-10-CM

## 2015-01-27 DIAGNOSIS — I5033 Acute on chronic diastolic (congestive) heart failure: Secondary | ICD-10-CM | POA: Diagnosis not present

## 2015-01-27 DIAGNOSIS — I6522 Occlusion and stenosis of left carotid artery: Secondary | ICD-10-CM

## 2015-01-27 DIAGNOSIS — I4891 Unspecified atrial fibrillation: Secondary | ICD-10-CM | POA: Diagnosis not present

## 2015-01-27 DIAGNOSIS — I6521 Occlusion and stenosis of right carotid artery: Secondary | ICD-10-CM

## 2015-01-27 NOTE — Patient Instructions (Signed)
Medication Instructions:  Your physician recommends that you continue on your current medications as directed. Please refer to the Current Medication list given to you today.  Labwork: none  Testing/Procedures: none  Follow-Up: Your physician wants you to follow-up in: 4 month ov Dr [place]  You will receive a reminder letter in the mail two months in advance. If you don't receive a letter, please call our office to schedule the follow-up appointment.  If you need a refill on your cardiac medications before your next appointment, please call your pharmacy.

## 2015-01-27 NOTE — Progress Notes (Signed)
Cardiology Office Note   Date:  01/27/2015   ID:  [name], DOB 1934/03/13, MRN [id number]  PCP:  Horatio Pel, MD  Cardiologist: Darlin Coco MD  No chief complaint on file.     History of Present Illness: [name] is a 80 y.o. female who presents for a four-month follow-up visit.  This pleasant 80 year old woman is seen for followup office visit. She was admitted to the hospital in February 2015 because of new onset atrial fibrillation. She was already on long-term Coumadin. Diltiazem for rate control was added to her regimen in the hospital. The patient has a past history of pulmonary emboli and is on long-term Coumadin. Her INR has been stable. She has a history of essential hypertension and a history of right carotid artery occlusion. She has a past history of pulmonary emboli in January 2011 after right hip surgery and at that time venous Dopplers of the legs were negative for a source of embolization and she has remained on long-term Coumadin. 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 Donnetta Hutching. She does not have any significant coronary disease and she had cardiac catheterization in 1994 showing only mild plaque and she had a nuclear stress test in December 2011 showing no ischemia and her ejection fraction was 78%.  In 2016 she had left total shoulder replacement surgery by Dr. Onnie Graham.  She has a past history of fluid retention but has not done well with even low dose furosemide.  Therefore she is on Lozol daily and she takes low dose furosemide about once a week if necessary for fluid retention. She remains in atrial fibrillation with controlled ventricular response.  She states that at home her pulse is usually between 90 and 110.  Complaint of easy fatigue.  She thinks some of this may be from her bystolic. She has had a problem with long-term morbid obesity which contributes  significantly to her fatigue and her dyspnea .  She is relatively sedentary at home.  Past Medical History  Diagnosis Date  . Hypertension   . Gout   . Tinnitus   . Acid reflux disease   . Pulmonary embolism ([place]) 2011    a. Hx of PE in 02/2009 after R hip surgery, venous dopplers negative, long-term Coumadin.  . Vertigo   . Crohn disease ([place])   . Long term current use of anticoagulant   . GERD (gastroesophageal reflux disease)   . Recurrent upper respiratory infection (URI)     BRONCHITIS FEB 2013--SLIGHT COUGH NON-PRODUCTIVE NOW  . H/O hiatal hernia   . PVC (premature ventricular contraction)     PT STATES HX OF PVC'S ON EKG  . Peripheral vascular disease (HCC)     KNOWN RIGHT INTERNAL CAROTID ARTERY OCCLUSION (NO STROKE)  --40 TO 59% STENOSIS LEFT ICA-FOLLOWED BY DR. EARLY WITH DOPPLER STUDY EVERY 6 MONTHS  . Fibromyalgia   . High triglycerides   . Obstructive sleep apnea on CPAP   . Crohn's disease ([place])   . History of stomach ulcers 1950's  . Osteoarthritis     PAIN AND OA LEF T HIP AND BOTH SHOULDERS ARE BONE ON BONE AND PAINFUL  . Recurrent UTI (urinary tract infection)     "on daily medicine" (05/14/2012)  . Carotid artery occlusion   . Detached retina   . Atrial fibrillation ([place])   . Fibromyalgia   . Chronic  diastolic CHF (congestive heart failure) ([place])     Hypertensive heart disease 02-12-14    Past Surgical History  Procedure Laterality Date  . Femur fracture surgery Right 02/21/09    Open reduction internal fixation of right periprosthetic  femur fracture utilizing Zimmer cables times fiv  . Hammer toe surgery Left 10/25/08    "toe next to big to" (05/14/2012)  . Back surgery  10/29/06    Central and foraminal decompression L3-L4, L4-L5, and L5-S1 with inspection of L4-L5 and L5-S1 disc on the right  . Knee arthroscopy Right 07/26/05  . Total hip arthroplasty Right 12/08/02    Osteonics total hip replacement  . Excision morton's neuroma Right 05/20/00    "foot"  (05/14/2012)  . Total hip arthroplasty  06/17/2011    Procedure: TOTAL HIP ARTHROPLASTY;  Surgeon: Gearlean Alf, MD;  Location: [facility];  Service: Orthopedics;  Laterality: Left;  . Joint replacement  2009    Right Hip replacement  . Joint replacement  2012    Right knee replacement  . Joint replacement  06/17/11    Left Hip replacement  . Fracture surgery  2011    Right Femur Fx  . Total shoulder arthroplasty Left 05/14/2012  . Tonsillectomy and adenoidectomy  1958?  [name] Kitchen Appendectomy  1950's  . Cholecystectomy  ~ 1988  . Vaginal hysterectomy  1971  . Dilation and curettage of uterus      "several; from miscarriages" (05/14/2012)  . Decompressive lumbar laminectomy level 3  ~ 2002  . Replacement total knee Right 02/19/2010  . Cardiac catheterization  1970's    "maybe 2" (05/14/2012)  . Cardiac catheterization    . Cataract extraction w/ intraocular lens implant Right 2009  . Total shoulder arthroplasty Left 05/14/2012    Procedure: TOTAL SHOULDER ARTHROPLASTY;  Surgeon: Marin Shutter, MD;  Location: [facility];  Service: Orthopedics;  Laterality: Left;  [name] Kitchen Eye surgery Left 2014    Detached retina  . Eye surgery Left August 23, 2013    Cataract  . Spine surgery       Current Outpatient Prescriptions  Medication Sig Dispense Refill  . acetaminophen (TYLENOL) 500 MG tablet Take 500 mg by mouth daily as needed for headache.    . allopurinol (ZYLOPRIM) 300 MG tablet Take 300 mg by mouth Daily.     [name] Kitchen amLODipine (NORVASC) 5 MG tablet Take 1 tablet (5 mg total) by mouth daily. 30 tablet 6  . calcium citrate-vitamin D (CITRACAL+D) 315-200 MG-UNIT per tablet Take 1 tablet by mouth daily.    . CELEBREX 200 MG capsule Take 200 mg by mouth Daily.    . cetirizine (ZYRTEC) 10 MG tablet Take 10 mg by mouth daily.    . cholecalciferol (VITAMIN D) 1000 UNITS tablet Take 4,000 Units by mouth daily.     . clobetasol cream (TEMOVATE) 5.36 % Apply 1 application topically 2 (two) times daily.    [name] Kitchen diltiazem (CARDIZEM)  60 MG tablet Take 60 mg by mouth every 6 (six) hours as needed (symptomatic AFIB).     [name] Kitchen diphenhydrAMINE (BENADRYL) 25 mg capsule Take 25 mg by mouth at bedtime as needed for sleep. For sleep    . dorzolamide-timolol (COSOPT) 22.3-6.8 MG/ML ophthalmic solution Place 1 drop into both eyes 2 (two) times daily.     . fenofibrate 160 MG tablet Take 160 mg by mouth every evening.     . furosemide (LASIX) 20 MG tablet Take 20 mg by mouth as needed for fluid.    [name] Kitchen  HYDROcodone-acetaminophen (NORCO) 10-325 MG per tablet Take 1 tablet by mouth every 6 (six) hours as needed (pain).     . indapamide (LOZOL) 2.5 MG tablet Take 1 tablet (2.5 mg total) by mouth every morning. 90 tablet 3  . ketoconazole (NIZORAL) 2 % cream Apply 1 application topically 2 (two) times daily as needed for irritation.     . Magnesium 400 MG CAPS Take 400 mg by mouth daily.     . Multiple Vitamins-Minerals (MULTIVITAMIN WITH MINERALS) tablet Take 1 tablet by mouth daily.    . nebivolol (BYSTOLIC) 10 MG tablet Take 10 mg by mouth daily.    . nitrofurantoin (MACRODANTIN) 50 MG capsule Take 50 mg by mouth daily.     [name] Kitchen omeprazole (PRILOSEC) 40 MG capsule Take 40 mg by mouth daily with supper.     . vitamin C (ASCORBIC ACID) 500 MG tablet Take 500 mg by mouth daily.    [name] Kitchen warfarin (COUMADIN) 2.5 MG tablet TAKE (1) TABLET AS DIRECTED. 40 tablet 3  . [DISCONTINUED] amitriptyline (ELAVIL) 10 MG tablet Take 10 mg by mouth at bedtime.      No current facility-administered medications for this visit.    Allergies:   Ciprofloxacin; Codeine; Cymbalta; Diltiazem cd; Metoprolol; Nortriptyline; Penicillins; Statins; Tetanus toxoids; and Lyrica    Social History:  The patient  reports that she quit smoking about 28 years ago. Her smoking use included Cigarettes. She has a 7.5 pack-year smoking history. She has never used smokeless tobacco. She reports that she drinks alcohol. She reports that she does not use illicit drugs.   Family History:   The patient's family history includes Aneurysm in her mother; Diabetes in her paternal grandfather and son; Heart attack in her brother; Heart attack (age of onset: 12) in her father; Heart disease in her brother, father, and mother; Hyperlipidemia in her brother; Hypertension in her brother, brother, father, and mother; Peripheral vascular disease in her father; Stroke in her mother.    ROS:  Please see the history of present illness.   Otherwise, review of systems are positive for none.   All other systems are reviewed and negative.    PHYSICAL EXAM: VS:  BP 140/70 mmHg  Pulse 100  Ht 5' 2"  (1.575 m)  Wt 232 lb (105.235 kg)  BMI 42.42 kg/m2 , BMI Body mass index is 42.42 kg/(m^2). GEN: Well nourished, well developed, in no acute distress HEENT: normal Neck: no JVD, carotid bruits, or masses Cardiac: Irregularly irregular; no murmurs, rubs, or gallops,no edema  Respiratory:  clear to auscultation bilaterally, normal work of breathing GI: soft, nontender, nondistended, + BS MS: no deformity or atrophy Skin: warm and dry, no rash Neuro:  Strength and sensation are intact Psych: euthymic mood, full affect   EKG:  EKG is not ordered today.    Recent Labs: 02/12/2014: Hemoglobin 12.9; Platelets 131* 02/18/2014: BUN 25*; Creatinine, Ser 1.0; Potassium 4.1; Sodium 146*    Lipid Panel No results found for: CHOL, TRIG, HDL, CHOLHDL, VLDL, LDLCALC, LDLDIRECT    Wt Readings from Last 3 Encounters:  01/27/15 232 lb (105.235 kg)  10/21/14 231 lb 12.8 oz (105.144 kg)  10/11/14 233 lb (105.688 kg)        ASSESSMENT AND PLAN:  1. paroxysmal atrial fibrillation on long-term anticoagulation. Doing well on rate control and long-term anticoagulation. 2. prior pulmonary emboli, on warfarin 3. essential hypertension with intolerance to metoprolol. 4. Chronic diastolic CHF. She does not tolerate Lasix on a maintenance schedule. She  would  uses it just when necessary for extreme peripheral  edema.   Current medicines are reviewed at length with the patient today.  The patient does not have concerns regarding medicines.  The following changes have been made:  no change  Labs/ tests ordered today include:  No orders of the defined types were placed in this encounter.     Disposition: Continue current medication.  We will have her follow-up after my retirement with Dr. [place]ique at [facility].  Continue to work on trying to lose weight  Signed, Darlin Coco MD 01/27/2015 5:32 PM    [facility] [address], [place], [place]  [address] Phone: [phone]; Fax: [phone]

## 2015-02-08 ENCOUNTER — Ambulatory Visit (INDEPENDENT_AMBULATORY_CARE_PROVIDER_SITE_OTHER): Payer: Medicare Other | Admitting: Pharmacist

## 2015-02-08 DIAGNOSIS — I2699 Other pulmonary embolism without acute cor pulmonale: Secondary | ICD-10-CM | POA: Diagnosis not present

## 2015-02-08 DIAGNOSIS — I4891 Unspecified atrial fibrillation: Secondary | ICD-10-CM | POA: Diagnosis not present

## 2015-02-08 LAB — POCT INR: INR: 3.1

## 2015-02-14 DIAGNOSIS — I509 Heart failure, unspecified: Secondary | ICD-10-CM | POA: Diagnosis not present

## 2015-02-14 DIAGNOSIS — E876 Hypokalemia: Secondary | ICD-10-CM | POA: Diagnosis not present

## 2015-02-14 DIAGNOSIS — R093 Abnormal sputum: Secondary | ICD-10-CM | POA: Diagnosis not present

## 2015-02-15 DIAGNOSIS — I509 Heart failure, unspecified: Secondary | ICD-10-CM | POA: Diagnosis not present

## 2015-02-16 DIAGNOSIS — I509 Heart failure, unspecified: Secondary | ICD-10-CM | POA: Diagnosis not present

## 2015-02-16 DIAGNOSIS — R093 Abnormal sputum: Secondary | ICD-10-CM | POA: Diagnosis not present

## 2015-02-16 DIAGNOSIS — Z6841 Body Mass Index (BMI) 40.0 and over, adult: Secondary | ICD-10-CM | POA: Diagnosis not present

## 2015-02-16 DIAGNOSIS — I10 Essential (primary) hypertension: Secondary | ICD-10-CM | POA: Diagnosis not present

## 2015-02-21 ENCOUNTER — Ambulatory Visit: Payer: Medicare Other | Admitting: Podiatry

## 2015-02-21 DIAGNOSIS — J069 Acute upper respiratory infection, unspecified: Secondary | ICD-10-CM | POA: Diagnosis not present

## 2015-02-21 DIAGNOSIS — I110 Hypertensive heart disease with heart failure: Secondary | ICD-10-CM | POA: Diagnosis not present

## 2015-02-21 DIAGNOSIS — I509 Heart failure, unspecified: Secondary | ICD-10-CM | POA: Diagnosis not present

## 2015-02-21 DIAGNOSIS — M6281 Muscle weakness (generalized): Secondary | ICD-10-CM | POA: Diagnosis not present

## 2015-02-21 DIAGNOSIS — E876 Hypokalemia: Secondary | ICD-10-CM | POA: Diagnosis not present

## 2015-02-21 DIAGNOSIS — R7303 Prediabetes: Secondary | ICD-10-CM | POA: Diagnosis not present

## 2015-02-21 DIAGNOSIS — M797 Fibromyalgia: Secondary | ICD-10-CM | POA: Diagnosis not present

## 2015-02-24 DIAGNOSIS — R7303 Prediabetes: Secondary | ICD-10-CM | POA: Diagnosis not present

## 2015-02-24 DIAGNOSIS — I509 Heart failure, unspecified: Secondary | ICD-10-CM | POA: Diagnosis not present

## 2015-02-24 DIAGNOSIS — M797 Fibromyalgia: Secondary | ICD-10-CM | POA: Diagnosis not present

## 2015-02-24 DIAGNOSIS — I110 Hypertensive heart disease with heart failure: Secondary | ICD-10-CM | POA: Diagnosis not present

## 2015-02-24 DIAGNOSIS — J069 Acute upper respiratory infection, unspecified: Secondary | ICD-10-CM | POA: Diagnosis not present

## 2015-02-24 DIAGNOSIS — M6281 Muscle weakness (generalized): Secondary | ICD-10-CM | POA: Diagnosis not present

## 2015-02-27 DIAGNOSIS — R7303 Prediabetes: Secondary | ICD-10-CM | POA: Diagnosis not present

## 2015-02-27 DIAGNOSIS — M797 Fibromyalgia: Secondary | ICD-10-CM | POA: Diagnosis not present

## 2015-02-27 DIAGNOSIS — M6281 Muscle weakness (generalized): Secondary | ICD-10-CM | POA: Diagnosis not present

## 2015-02-27 DIAGNOSIS — J069 Acute upper respiratory infection, unspecified: Secondary | ICD-10-CM | POA: Diagnosis not present

## 2015-02-27 DIAGNOSIS — I509 Heart failure, unspecified: Secondary | ICD-10-CM | POA: Diagnosis not present

## 2015-02-27 DIAGNOSIS — I110 Hypertensive heart disease with heart failure: Secondary | ICD-10-CM | POA: Diagnosis not present

## 2015-03-02 DIAGNOSIS — M797 Fibromyalgia: Secondary | ICD-10-CM | POA: Diagnosis not present

## 2015-03-02 DIAGNOSIS — I509 Heart failure, unspecified: Secondary | ICD-10-CM | POA: Diagnosis not present

## 2015-03-02 DIAGNOSIS — I110 Hypertensive heart disease with heart failure: Secondary | ICD-10-CM | POA: Diagnosis not present

## 2015-03-02 DIAGNOSIS — R7303 Prediabetes: Secondary | ICD-10-CM | POA: Diagnosis not present

## 2015-03-02 DIAGNOSIS — J069 Acute upper respiratory infection, unspecified: Secondary | ICD-10-CM | POA: Diagnosis not present

## 2015-03-02 DIAGNOSIS — M6281 Muscle weakness (generalized): Secondary | ICD-10-CM | POA: Diagnosis not present

## 2015-03-03 DIAGNOSIS — M6281 Muscle weakness (generalized): Secondary | ICD-10-CM | POA: Diagnosis not present

## 2015-03-03 DIAGNOSIS — R7303 Prediabetes: Secondary | ICD-10-CM | POA: Diagnosis not present

## 2015-03-03 DIAGNOSIS — I10 Essential (primary) hypertension: Secondary | ICD-10-CM | POA: Diagnosis not present

## 2015-03-03 DIAGNOSIS — I509 Heart failure, unspecified: Secondary | ICD-10-CM | POA: Diagnosis not present

## 2015-03-03 DIAGNOSIS — J069 Acute upper respiratory infection, unspecified: Secondary | ICD-10-CM | POA: Diagnosis not present

## 2015-03-03 DIAGNOSIS — I110 Hypertensive heart disease with heart failure: Secondary | ICD-10-CM | POA: Diagnosis not present

## 2015-03-03 DIAGNOSIS — M797 Fibromyalgia: Secondary | ICD-10-CM | POA: Diagnosis not present

## 2015-03-03 DIAGNOSIS — E876 Hypokalemia: Secondary | ICD-10-CM | POA: Diagnosis not present

## 2015-03-03 DIAGNOSIS — R093 Abnormal sputum: Secondary | ICD-10-CM | POA: Diagnosis not present

## 2015-03-06 ENCOUNTER — Telehealth: Payer: Self-pay | Admitting: *Deleted

## 2015-03-06 DIAGNOSIS — M6281 Muscle weakness (generalized): Secondary | ICD-10-CM | POA: Diagnosis not present

## 2015-03-06 DIAGNOSIS — M797 Fibromyalgia: Secondary | ICD-10-CM | POA: Diagnosis not present

## 2015-03-06 DIAGNOSIS — J069 Acute upper respiratory infection, unspecified: Secondary | ICD-10-CM | POA: Diagnosis not present

## 2015-03-06 DIAGNOSIS — I110 Hypertensive heart disease with heart failure: Secondary | ICD-10-CM | POA: Diagnosis not present

## 2015-03-06 DIAGNOSIS — R7303 Prediabetes: Secondary | ICD-10-CM | POA: Diagnosis not present

## 2015-03-06 DIAGNOSIS — I509 Heart failure, unspecified: Secondary | ICD-10-CM | POA: Diagnosis not present

## 2015-03-06 NOTE — Telephone Encounter (Signed)
Debbie, RN with [facility] called to state the pt was receiving Home Health. She wanted to know if she could check the pt's INR since she is receiving Home Health & call into the office-orders given for POC-INR & to call to [facility] with results.

## 2015-03-07 ENCOUNTER — Ambulatory Visit (INDEPENDENT_AMBULATORY_CARE_PROVIDER_SITE_OTHER): Payer: Medicare Other | Admitting: Pharmacist

## 2015-03-07 DIAGNOSIS — I509 Heart failure, unspecified: Secondary | ICD-10-CM | POA: Diagnosis not present

## 2015-03-07 DIAGNOSIS — I4891 Unspecified atrial fibrillation: Secondary | ICD-10-CM

## 2015-03-07 DIAGNOSIS — R7303 Prediabetes: Secondary | ICD-10-CM | POA: Diagnosis not present

## 2015-03-07 DIAGNOSIS — M797 Fibromyalgia: Secondary | ICD-10-CM | POA: Diagnosis not present

## 2015-03-07 DIAGNOSIS — M6281 Muscle weakness (generalized): Secondary | ICD-10-CM | POA: Diagnosis not present

## 2015-03-07 DIAGNOSIS — J069 Acute upper respiratory infection, unspecified: Secondary | ICD-10-CM | POA: Diagnosis not present

## 2015-03-07 DIAGNOSIS — I110 Hypertensive heart disease with heart failure: Secondary | ICD-10-CM | POA: Diagnosis not present

## 2015-03-07 LAB — POCT INR: INR: 2.8

## 2015-03-08 ENCOUNTER — Ambulatory Visit (INDEPENDENT_AMBULATORY_CARE_PROVIDER_SITE_OTHER): Payer: Medicare Other | Admitting: Podiatry

## 2015-03-08 ENCOUNTER — Encounter: Payer: Self-pay | Admitting: Podiatry

## 2015-03-08 DIAGNOSIS — L84 Corns and callosities: Secondary | ICD-10-CM | POA: Diagnosis not present

## 2015-03-09 DIAGNOSIS — R7303 Prediabetes: Secondary | ICD-10-CM | POA: Diagnosis not present

## 2015-03-09 DIAGNOSIS — I110 Hypertensive heart disease with heart failure: Secondary | ICD-10-CM | POA: Diagnosis not present

## 2015-03-09 DIAGNOSIS — M6281 Muscle weakness (generalized): Secondary | ICD-10-CM | POA: Diagnosis not present

## 2015-03-09 DIAGNOSIS — J069 Acute upper respiratory infection, unspecified: Secondary | ICD-10-CM | POA: Diagnosis not present

## 2015-03-09 DIAGNOSIS — M797 Fibromyalgia: Secondary | ICD-10-CM | POA: Diagnosis not present

## 2015-03-09 DIAGNOSIS — I509 Heart failure, unspecified: Secondary | ICD-10-CM | POA: Diagnosis not present

## 2015-03-09 NOTE — Progress Notes (Signed)
Patient ID: [name], female   DOB: October 09, 1934, 81 y.o.   MRN: [id number]  Subjective: This patient presents again for schedule visit for debridement of pre-ulcerative plantar callus on the plantar aspect left first MPJ. This area has episodes of superficial ulceration. Patient continues to wear athletic style shoes with accommodative shoe inserts with pocket accommodations to offload the plantar left first MPJ as well as additional padding fifth deepen the pocket  Objective: Orientated 5 female patient presents with her husband and treatment room  Plantar aspect left first MPJ has reactive callus without any surrounding erythema, edema, drainage or bleeding within the callus  Assessment: Pre-ulcerative plantar callus left first MPJ  Plan: Debride plantar callus left first MPJ without any bleeding As reactive plantar callus is no bleeding within callus will extend next visit 8 weeks  Reappoint 8 weeks

## 2015-03-10 DIAGNOSIS — I110 Hypertensive heart disease with heart failure: Secondary | ICD-10-CM | POA: Diagnosis not present

## 2015-03-10 DIAGNOSIS — I509 Heart failure, unspecified: Secondary | ICD-10-CM | POA: Diagnosis not present

## 2015-03-10 DIAGNOSIS — M6281 Muscle weakness (generalized): Secondary | ICD-10-CM | POA: Diagnosis not present

## 2015-03-10 DIAGNOSIS — J069 Acute upper respiratory infection, unspecified: Secondary | ICD-10-CM | POA: Diagnosis not present

## 2015-03-10 DIAGNOSIS — R7303 Prediabetes: Secondary | ICD-10-CM | POA: Diagnosis not present

## 2015-03-10 DIAGNOSIS — M797 Fibromyalgia: Secondary | ICD-10-CM | POA: Diagnosis not present

## 2015-03-13 DIAGNOSIS — R358 Other polyuria: Secondary | ICD-10-CM | POA: Diagnosis not present

## 2015-03-13 DIAGNOSIS — N390 Urinary tract infection, site not specified: Secondary | ICD-10-CM | POA: Diagnosis not present

## 2015-03-13 DIAGNOSIS — I509 Heart failure, unspecified: Secondary | ICD-10-CM | POA: Diagnosis not present

## 2015-03-13 DIAGNOSIS — M6281 Muscle weakness (generalized): Secondary | ICD-10-CM | POA: Diagnosis not present

## 2015-03-13 DIAGNOSIS — I110 Hypertensive heart disease with heart failure: Secondary | ICD-10-CM | POA: Diagnosis not present

## 2015-03-13 DIAGNOSIS — J069 Acute upper respiratory infection, unspecified: Secondary | ICD-10-CM | POA: Diagnosis not present

## 2015-03-13 DIAGNOSIS — R3915 Urgency of urination: Secondary | ICD-10-CM | POA: Diagnosis not present

## 2015-03-13 DIAGNOSIS — R7303 Prediabetes: Secondary | ICD-10-CM | POA: Diagnosis not present

## 2015-03-13 DIAGNOSIS — M797 Fibromyalgia: Secondary | ICD-10-CM | POA: Diagnosis not present

## 2015-03-14 DIAGNOSIS — I509 Heart failure, unspecified: Secondary | ICD-10-CM | POA: Diagnosis not present

## 2015-03-14 DIAGNOSIS — I110 Hypertensive heart disease with heart failure: Secondary | ICD-10-CM | POA: Diagnosis not present

## 2015-03-14 DIAGNOSIS — J069 Acute upper respiratory infection, unspecified: Secondary | ICD-10-CM | POA: Diagnosis not present

## 2015-03-14 DIAGNOSIS — R7303 Prediabetes: Secondary | ICD-10-CM | POA: Diagnosis not present

## 2015-03-14 DIAGNOSIS — M6281 Muscle weakness (generalized): Secondary | ICD-10-CM | POA: Diagnosis not present

## 2015-03-14 DIAGNOSIS — M797 Fibromyalgia: Secondary | ICD-10-CM | POA: Diagnosis not present

## 2015-03-15 ENCOUNTER — Encounter (HOSPITAL_COMMUNITY): Payer: Self-pay | Admitting: Internal Medicine

## 2015-03-15 ENCOUNTER — Ambulatory Visit (HOSPITAL_COMMUNITY)
Admission: RE | Admit: 2015-03-15 | Discharge: 2015-03-15 | Disposition: A | Discharge status: Home / Self Care | Payer: Medicare Other | Source: Ambulatory Visit | Attending: Internal Medicine | Admitting: Internal Medicine

## 2015-03-15 VITALS — BP 132/82 | HR 89 | Wt 233.8 lb

## 2015-03-15 DIAGNOSIS — Z7901 Long term (current) use of anticoagulants: Secondary | ICD-10-CM | POA: Diagnosis not present

## 2015-03-15 DIAGNOSIS — I110 Hypertensive heart disease with heart failure: Secondary | ICD-10-CM | POA: Insufficient documentation

## 2015-03-15 DIAGNOSIS — Z86711 Personal history of pulmonary embolism: Secondary | ICD-10-CM | POA: Diagnosis not present

## 2015-03-15 DIAGNOSIS — I482 Chronic atrial fibrillation: Secondary | ICD-10-CM | POA: Diagnosis not present

## 2015-03-15 DIAGNOSIS — I10 Essential (primary) hypertension: Secondary | ICD-10-CM

## 2015-03-15 DIAGNOSIS — I4891 Unspecified atrial fibrillation: Secondary | ICD-10-CM | POA: Diagnosis not present

## 2015-03-15 DIAGNOSIS — I5032 Chronic diastolic (congestive) heart failure: Secondary | ICD-10-CM | POA: Diagnosis not present

## 2015-03-15 MED ORDER — NEBIVOLOL HCL 10 MG PO TABS: 10.0000 mg | ORAL_TABLET | Freq: Every day | ORAL | Status: DC

## 2015-03-15 NOTE — Progress Notes (Signed)
Patient ID: [name], female   DOB: 04-29-1934, 81 y.o.   MRN: [id number]     Cardiology Office Note   Date:  03/15/2015   ID:  [name], DOB 11/21/1934, MRN [id number]  PCP:  Horatio Pel, MD  Cardiologist: Darlin Coco MD Referring: Dr. 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 She has been followed by Dr. Mare Ferrari and Dr. Shelia Media. Dr. Shelia Media has referred her to [facility] Clinic for evaluation of her HF regimen.   She was admitted to the hospital in February 2015 because of new onset atrial fibrillation. She was already on long-term Coumadin. Diltiazem for rate control was added to her regimen in the hospital. The patient has a past history of pulmonary emboli and is on long-term Coumadin. Her INR has been stable. She has a history of essential hypertension and a history of right carotid artery occlusion. She has a past history of pulmonary emboli in January 2011 after right hip surgery and at that time venous Dopplers of the legs were negative for a source of embolization and she has remained on long-term Coumadin. 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 Donnetta Hutching. She does not have any significant coronary disease and she had cardiac catheterization in 1994 showing only mild plaque and she had a nuclear stress test in December 2011 showing no ischemia and her ejection fraction was 78%.   Says overall feels ok in the mornings. Can do her ADLs and do ok. Then takes Bystolic and feels legs get heavy and lasts several hours. More SOB. Says she does not drink much water or eat ice. Takes indapamide 2.5 daily and lasix 49m 3x/week which keeps fluid off. Mild ankle edema at night. No orthopnea or PND. Sleeps well with CPAP. Has HHPT for balance and strengthening. Has not dieted to try to  get some of the weight.    Echo 2015: - Left ventricle: The cavity size was normal. There was moderate concentric hypertrophy. Systolic function was normal. The estimated ejection fraction was in the range of 50% to 55%. Wall motion was normal; there were no regional wall motion abnormalities. The study was not technically sufficient to allow evaluation of LV diastolic dysfunction due to atrial fibrillation. - Aortic valve: Trileaflet; normal thickness leaflets. Transvalvular velocity was within the normal range. There was no stenosis. No regurgitation. - Aortic root: The aortic root was normal in size. - Mitral valve: Structurally normal valve. Trivial regurgitation. - Left atrium: The atrium was mildly dilated. - Right ventricle: Systolic function was normal. - Right atrium: The atrium was normal in size. - Tricuspid valve: No regurgitation. - Pulmonic valve: Trivial regurgitation. - Pulmonary arteries: Systolic pressure was within the normal range. - Inferior vena cava: Not visualized. - Pericardium, extracardiac: There was no pericardial effusion.  Past Medical History  Diagnosis Date  . Hypertension   . Gout   . Tinnitus   . Acid reflux disease   . [place]    a. Hx of PE in 02/2009 after R hip surgery, venous dopplers negative, long-term Coumadin.  . Vertigo   . [place]   . Long term current use of anticoagulant   . GERD (gastroesophageal reflux disease)   . Recurrent upper respiratory infection (URI)  BRONCHITIS FEB 2013--SLIGHT COUGH NON-PRODUCTIVE NOW  . H/O hiatal hernia   . PVC (premature ventricular contraction)     PT STATES HX OF PVC'S ON EKG  . Peripheral vascular disease (HCC)     KNOWN RIGHT INTERNAL CAROTID ARTERY OCCLUSION (NO STROKE)  --40 TO 59% STENOSIS LEFT ICA-FOLLOWED BY DR. EARLY WITH DOPPLER STUDY EVERY 6 MONTHS  . Fibromyalgia   . High triglycerides   . Obstructive sleep apnea on CPAP    . Crohn's disease ([employer])   . History of stomach ulcers 1950's  . Osteoarthritis     PAIN AND OA LEF T HIP AND BOTH SHOULDERS ARE BONE ON BONE AND PAINFUL  . Recurrent UTI (urinary tract infection)     "on daily medicine" (05/14/2012)  . Carotid artery occlusion   . Detached retina   . Atrial fibrillation ([place])   . Fibromyalgia   . Chronic diastolic CHF (congestive heart failure) (HCC)     Hypertensive heart disease 02-12-14    Past Surgical History  Procedure Laterality Date  . Femur fracture surgery Right 02/21/09    Open reduction internal fixation of right periprosthetic  femur fracture utilizing Zimmer cables times fiv  . Hammer toe surgery Left 10/25/08    "toe next to big to" (05/14/2012)  . Back surgery  10/29/06    Central and foraminal decompression L3-L4, L4-L5, and L5-S1 with inspection of L4-L5 and L5-S1 disc on the right  . Knee arthroscopy Right 07/26/05  . Total hip arthroplasty Right 12/08/02    Osteonics total hip replacement  . Excision morton's neuroma Right 05/20/00    "foot" (05/14/2012)  . Total hip arthroplasty  06/17/2011    Procedure: TOTAL HIP ARTHROPLASTY;  Surgeon: Gearlean Alf, MD;  [facility]: [facility];  Service: Orthopedics;  Laterality: Left;  . Joint replacement  2009    Right Hip replacement  . Joint replacement  2012    Right knee replacement  . Joint replacement  06/17/11    Left Hip replacement  . Fracture surgery  2011    Right Femur Fx  . Total shoulder arthroplasty Left 05/14/2012  . Tonsillectomy and adenoidectomy  1958?  [name] Kitchen Appendectomy  1950's  . Cholecystectomy  ~ 1988  . Vaginal hysterectomy  1971  . Dilation and curettage of uterus      "several; from miscarriages" (05/14/2012)  . Decompressive lumbar laminectomy level 3  ~ 2002  . Replacement total knee Right 02/19/2010  . Cardiac catheterization  1970's    "maybe 2" (05/14/2012)  . Cardiac catheterization    . Cataract extraction w/ intraocular lens implant Right 2009  . Total shoulder  arthroplasty Left 05/14/2012    Procedure: TOTAL SHOULDER ARTHROPLASTY;  Surgeon: Marin Shutter, MD;  Location: [facility];  Service: Orthopedics;  Laterality: Left;  [name] Kitchen Eye surgery Left 2014    Detached retina  . Eye surgery Left August 23, 2013    Cataract  . Spine surgery       Current Outpatient Prescriptions  Medication Sig Dispense Refill  . acetaminophen (TYLENOL) 500 MG tablet Take 500 mg by mouth daily as needed for headache.    . allopurinol (ZYLOPRIM) 300 MG tablet Take 300 mg by mouth Daily.     [name] Kitchen amLODipine (NORVASC) 5 MG tablet Take 1 tablet (5 mg total) by mouth daily. 30 tablet 6  . calcium citrate-vitamin D (CITRACAL+D) 315-200 MG-UNIT per tablet Take 1 tablet by mouth daily.    . CELEBREX 200 MG capsule Take  200 mg by mouth Daily.    . cetirizine (ZYRTEC) 10 MG tablet Take 10 mg by mouth daily.    . clobetasol cream (TEMOVATE) 9.37 % Apply 1 application topically 2 (two) times daily.    [name] Kitchen diltiazem (CARDIZEM) 60 MG tablet Take 60 mg by mouth every 6 (six) hours as needed (symptomatic AFIB).     [name] Kitchen dorzolamide-timolol (COSOPT) 22.3-6.8 MG/ML ophthalmic solution Place 1 drop into both eyes 2 (two) times daily.     . fenofibrate 160 MG tablet Take 160 mg by mouth every evening.     . furosemide (LASIX) 20 MG tablet Take 20 mg by mouth. Take 1/2 tablet Monday, Wednesday, and Friday    . HYDROcodone-acetaminophen (NORCO) 10-325 MG per tablet Take 1 tablet by mouth every 6 (six) hours as needed (pain).     . indapamide (LOZOL) 2.5 MG tablet Take 1 tablet (2.5 mg total) by mouth every morning. 90 tablet 3  . ketoconazole (NIZORAL) 2 % cream Apply 1 application topically 2 (two) times daily as needed for irritation.     . Magnesium 400 MG CAPS Take 400 mg by mouth daily.     . Multiple Vitamins-Minerals (MULTIVITAMIN WITH MINERALS) tablet Take 1 tablet by mouth daily.    . nebivolol (BYSTOLIC) 10 MG tablet Take 10 mg by mouth daily.    . nitrofurantoin (MACRODANTIN) 50 MG capsule Take 50  mg by mouth daily.     [name] Kitchen omeprazole (PRILOSEC) 40 MG capsule Take 40 mg by mouth daily with supper.     . potassium chloride (K-DUR) 10 MEQ tablet Take 10 mEq by mouth daily.    . vitamin C (ASCORBIC ACID) 500 MG tablet Take 500 mg by mouth daily.    [name] Kitchen warfarin (COUMADIN) 2.5 MG tablet TAKE (1) TABLET AS DIRECTED. 40 tablet 3  . diphenhydrAMINE (BENADRYL) 25 mg capsule Take 25 mg by mouth at bedtime as needed for sleep. Reported on 03/15/2015    . [DISCONTINUED] amitriptyline (ELAVIL) 10 MG tablet Take 10 mg by mouth at bedtime.      No current facility-administered medications for this encounter.    Allergies:   Ciprofloxacin; Codeine; Cymbalta; Diltiazem cd; Metoprolol; Nortriptyline; Penicillins; Statins; Tetanus toxoids; and Lyrica    Social History:  The patient  reports that she quit smoking about 29 years ago. Her smoking use included Cigarettes. She has a 7.5 pack-year smoking history. She has never used smokeless tobacco. She reports that she drinks alcohol. She reports that she does not use illicit drugs.   Family History:  The patient's family history includes Aneurysm in her mother; Diabetes in her paternal grandfather and son; Heart attack in her brother; Heart attack (age of onset: 56) in her father; Heart disease in her brother, father, and mother; Hyperlipidemia in her brother; Hypertension in her brother, brother, father, and mother; Peripheral vascular disease in her father; Stroke in her mother.    ROS:  Please see the history of present illness.   Otherwise, review of systems are positive for none.   All other systems are reviewed and negative.    PHYSICAL EXAM: VS:  BP 132/82 mmHg  Pulse 89  Wt 233 lb 12 oz (106.028 kg)  SpO2 98% , BMI Body mass index is 42.74 kg/(m^2). GEN: Well nourished, well developed, in no acute distress HEENT: normal Neck: no JVD, carotid bruits, or masses Cardiac: Irregularly irregular; no murmurs, rubs, or gallops, trace edema  Respiratory:   clear to auscultation bilaterally, normal work  of breathing GI: marked central obesity. soft, nontender, nondistended, + BS MS: no deformity or atrophy Skin: warm and dry, no rash Neuro:  Strength and sensation are intact Psych: euthymic mood, full affect    Recent Labs: No results found for requested labs within last 365 days.    Lipid Panel No results found for: CHOL, TRIG, HDL, CHOLHDL, VLDL, LDLCALC, LDLDIRECT    Wt Readings from Last 3 Encounters:  03/15/15 233 lb 12 oz (106.028 kg)  01/27/15 232 lb (105.235 kg)  10/21/14 231 lb 12.8 oz (105.144 kg)        ASSESSMENT AND PLAN:  1. Chronic diastolic HF -Volume status looks pretty good despite her only wanting to use low dose lasix.  -Reinforced need for daily weights and reviewed use of sliding scale diuretics. 2. Chronic atrial fibrillation on long-term anticoagulation.  - Doing well on rate control and warfarin - Gets very fatigued with Bystolic. Have suggested moving it to qhs. If still struggling wit it can cut in half. 3. H/o prior pulmonary emboli - Continue lifelong warfarin 4. Essential hypertension  -Controlled 5. Morbid obesity -I think this is really the major limiting factor for her (she was 92 pounds when she got married) and now has been ~230 for many years. Long talk about need for aggressive dietary changes to lose weight - told her losing 20 pound would be great start. Have suggested [employer].   Total time spent 45 minutes. Over half that time spent discussing above.   Bufford Helms,MD 4:37 PM

## 2015-03-15 NOTE — Patient Instructions (Signed)
Take Bystolic at night  [place] physician recommends that you schedule a follow-up appointment in: 8 weeks

## 2015-03-16 DIAGNOSIS — J069 Acute upper respiratory infection, unspecified: Secondary | ICD-10-CM | POA: Diagnosis not present

## 2015-03-16 DIAGNOSIS — R7303 Prediabetes: Secondary | ICD-10-CM | POA: Diagnosis not present

## 2015-03-16 DIAGNOSIS — M6281 Muscle weakness (generalized): Secondary | ICD-10-CM | POA: Diagnosis not present

## 2015-03-16 DIAGNOSIS — I509 Heart failure, unspecified: Secondary | ICD-10-CM | POA: Diagnosis not present

## 2015-03-16 DIAGNOSIS — I110 Hypertensive heart disease with heart failure: Secondary | ICD-10-CM | POA: Diagnosis not present

## 2015-03-16 DIAGNOSIS — M797 Fibromyalgia: Secondary | ICD-10-CM | POA: Diagnosis not present

## 2015-03-17 DIAGNOSIS — M797 Fibromyalgia: Secondary | ICD-10-CM | POA: Diagnosis not present

## 2015-03-17 DIAGNOSIS — I509 Heart failure, unspecified: Secondary | ICD-10-CM | POA: Diagnosis not present

## 2015-03-17 DIAGNOSIS — J069 Acute upper respiratory infection, unspecified: Secondary | ICD-10-CM | POA: Diagnosis not present

## 2015-03-17 DIAGNOSIS — R7303 Prediabetes: Secondary | ICD-10-CM | POA: Diagnosis not present

## 2015-03-17 DIAGNOSIS — M6281 Muscle weakness (generalized): Secondary | ICD-10-CM | POA: Diagnosis not present

## 2015-03-17 DIAGNOSIS — I110 Hypertensive heart disease with heart failure: Secondary | ICD-10-CM | POA: Diagnosis not present

## 2015-03-21 DIAGNOSIS — R7303 Prediabetes: Secondary | ICD-10-CM | POA: Diagnosis not present

## 2015-03-21 DIAGNOSIS — M6281 Muscle weakness (generalized): Secondary | ICD-10-CM | POA: Diagnosis not present

## 2015-03-21 DIAGNOSIS — J069 Acute upper respiratory infection, unspecified: Secondary | ICD-10-CM | POA: Diagnosis not present

## 2015-03-21 DIAGNOSIS — M797 Fibromyalgia: Secondary | ICD-10-CM | POA: Diagnosis not present

## 2015-03-21 DIAGNOSIS — I509 Heart failure, unspecified: Secondary | ICD-10-CM | POA: Diagnosis not present

## 2015-03-21 DIAGNOSIS — I110 Hypertensive heart disease with heart failure: Secondary | ICD-10-CM | POA: Diagnosis not present

## 2015-03-23 DIAGNOSIS — R7303 Prediabetes: Secondary | ICD-10-CM | POA: Diagnosis not present

## 2015-03-23 DIAGNOSIS — E876 Hypokalemia: Secondary | ICD-10-CM | POA: Diagnosis not present

## 2015-03-23 DIAGNOSIS — I110 Hypertensive heart disease with heart failure: Secondary | ICD-10-CM | POA: Diagnosis not present

## 2015-03-23 DIAGNOSIS — I509 Heart failure, unspecified: Secondary | ICD-10-CM | POA: Diagnosis not present

## 2015-03-23 DIAGNOSIS — I10 Essential (primary) hypertension: Secondary | ICD-10-CM | POA: Diagnosis not present

## 2015-03-23 DIAGNOSIS — M6281 Muscle weakness (generalized): Secondary | ICD-10-CM | POA: Diagnosis not present

## 2015-03-23 DIAGNOSIS — M797 Fibromyalgia: Secondary | ICD-10-CM | POA: Diagnosis not present

## 2015-03-23 DIAGNOSIS — R300 Dysuria: Secondary | ICD-10-CM | POA: Diagnosis not present

## 2015-03-23 DIAGNOSIS — J069 Acute upper respiratory infection, unspecified: Secondary | ICD-10-CM | POA: Diagnosis not present

## 2015-03-24 DIAGNOSIS — R7303 Prediabetes: Secondary | ICD-10-CM | POA: Diagnosis not present

## 2015-03-24 DIAGNOSIS — I509 Heart failure, unspecified: Secondary | ICD-10-CM | POA: Diagnosis not present

## 2015-03-24 DIAGNOSIS — M797 Fibromyalgia: Secondary | ICD-10-CM | POA: Diagnosis not present

## 2015-03-24 DIAGNOSIS — J069 Acute upper respiratory infection, unspecified: Secondary | ICD-10-CM | POA: Diagnosis not present

## 2015-03-24 DIAGNOSIS — I110 Hypertensive heart disease with heart failure: Secondary | ICD-10-CM | POA: Diagnosis not present

## 2015-03-24 DIAGNOSIS — M6281 Muscle weakness (generalized): Secondary | ICD-10-CM | POA: Diagnosis not present

## 2015-03-28 DIAGNOSIS — E876 Hypokalemia: Secondary | ICD-10-CM | POA: Diagnosis not present

## 2015-03-28 DIAGNOSIS — J4521 Mild intermittent asthma with (acute) exacerbation: Secondary | ICD-10-CM | POA: Diagnosis not present

## 2015-03-28 DIAGNOSIS — I10 Essential (primary) hypertension: Secondary | ICD-10-CM | POA: Diagnosis not present

## 2015-03-28 DIAGNOSIS — I509 Heart failure, unspecified: Secondary | ICD-10-CM | POA: Diagnosis not present

## 2015-03-31 DIAGNOSIS — I110 Hypertensive heart disease with heart failure: Secondary | ICD-10-CM | POA: Diagnosis not present

## 2015-03-31 DIAGNOSIS — M797 Fibromyalgia: Secondary | ICD-10-CM | POA: Diagnosis not present

## 2015-03-31 DIAGNOSIS — J069 Acute upper respiratory infection, unspecified: Secondary | ICD-10-CM | POA: Diagnosis not present

## 2015-03-31 DIAGNOSIS — R7303 Prediabetes: Secondary | ICD-10-CM | POA: Diagnosis not present

## 2015-03-31 DIAGNOSIS — M6281 Muscle weakness (generalized): Secondary | ICD-10-CM | POA: Diagnosis not present

## 2015-03-31 DIAGNOSIS — I509 Heart failure, unspecified: Secondary | ICD-10-CM | POA: Diagnosis not present

## 2015-04-04 ENCOUNTER — Ambulatory Visit (INDEPENDENT_AMBULATORY_CARE_PROVIDER_SITE_OTHER): Payer: Medicare Other | Admitting: Interventional Cardiology

## 2015-04-04 DIAGNOSIS — I110 Hypertensive heart disease with heart failure: Secondary | ICD-10-CM | POA: Diagnosis not present

## 2015-04-04 DIAGNOSIS — I4891 Unspecified atrial fibrillation: Secondary | ICD-10-CM

## 2015-04-04 DIAGNOSIS — R7303 Prediabetes: Secondary | ICD-10-CM | POA: Diagnosis not present

## 2015-04-04 DIAGNOSIS — M6281 Muscle weakness (generalized): Secondary | ICD-10-CM | POA: Diagnosis not present

## 2015-04-04 DIAGNOSIS — E876 Hypokalemia: Secondary | ICD-10-CM | POA: Diagnosis not present

## 2015-04-04 DIAGNOSIS — M797 Fibromyalgia: Secondary | ICD-10-CM | POA: Diagnosis not present

## 2015-04-04 DIAGNOSIS — I509 Heart failure, unspecified: Secondary | ICD-10-CM | POA: Diagnosis not present

## 2015-04-04 DIAGNOSIS — J069 Acute upper respiratory infection, unspecified: Secondary | ICD-10-CM | POA: Diagnosis not present

## 2015-04-04 LAB — POCT INR: INR: 3.6

## 2015-04-05 DIAGNOSIS — R7303 Prediabetes: Secondary | ICD-10-CM | POA: Diagnosis not present

## 2015-04-05 DIAGNOSIS — I110 Hypertensive heart disease with heart failure: Secondary | ICD-10-CM | POA: Diagnosis not present

## 2015-04-05 DIAGNOSIS — I509 Heart failure, unspecified: Secondary | ICD-10-CM | POA: Diagnosis not present

## 2015-04-05 DIAGNOSIS — M6281 Muscle weakness (generalized): Secondary | ICD-10-CM | POA: Diagnosis not present

## 2015-04-05 DIAGNOSIS — J069 Acute upper respiratory infection, unspecified: Secondary | ICD-10-CM | POA: Diagnosis not present

## 2015-04-05 DIAGNOSIS — M797 Fibromyalgia: Secondary | ICD-10-CM | POA: Diagnosis not present

## 2015-04-06 DIAGNOSIS — M6281 Muscle weakness (generalized): Secondary | ICD-10-CM | POA: Diagnosis not present

## 2015-04-06 DIAGNOSIS — I509 Heart failure, unspecified: Secondary | ICD-10-CM | POA: Diagnosis not present

## 2015-04-06 DIAGNOSIS — I110 Hypertensive heart disease with heart failure: Secondary | ICD-10-CM | POA: Diagnosis not present

## 2015-04-06 DIAGNOSIS — R7303 Prediabetes: Secondary | ICD-10-CM | POA: Diagnosis not present

## 2015-04-06 DIAGNOSIS — J069 Acute upper respiratory infection, unspecified: Secondary | ICD-10-CM | POA: Diagnosis not present

## 2015-04-06 DIAGNOSIS — M797 Fibromyalgia: Secondary | ICD-10-CM | POA: Diagnosis not present

## 2015-04-07 DIAGNOSIS — J069 Acute upper respiratory infection, unspecified: Secondary | ICD-10-CM | POA: Diagnosis not present

## 2015-04-07 DIAGNOSIS — R7303 Prediabetes: Secondary | ICD-10-CM | POA: Diagnosis not present

## 2015-04-07 DIAGNOSIS — I110 Hypertensive heart disease with heart failure: Secondary | ICD-10-CM | POA: Diagnosis not present

## 2015-04-07 DIAGNOSIS — M6281 Muscle weakness (generalized): Secondary | ICD-10-CM | POA: Diagnosis not present

## 2015-04-07 DIAGNOSIS — M797 Fibromyalgia: Secondary | ICD-10-CM | POA: Diagnosis not present

## 2015-04-07 DIAGNOSIS — I509 Heart failure, unspecified: Secondary | ICD-10-CM | POA: Diagnosis not present

## 2015-04-10 DIAGNOSIS — J069 Acute upper respiratory infection, unspecified: Secondary | ICD-10-CM | POA: Diagnosis not present

## 2015-04-10 DIAGNOSIS — M6281 Muscle weakness (generalized): Secondary | ICD-10-CM | POA: Diagnosis not present

## 2015-04-10 DIAGNOSIS — M797 Fibromyalgia: Secondary | ICD-10-CM | POA: Diagnosis not present

## 2015-04-10 DIAGNOSIS — I509 Heart failure, unspecified: Secondary | ICD-10-CM | POA: Diagnosis not present

## 2015-04-10 DIAGNOSIS — I110 Hypertensive heart disease with heart failure: Secondary | ICD-10-CM | POA: Diagnosis not present

## 2015-04-10 DIAGNOSIS — R7303 Prediabetes: Secondary | ICD-10-CM | POA: Diagnosis not present

## 2015-04-11 DIAGNOSIS — I509 Heart failure, unspecified: Secondary | ICD-10-CM | POA: Diagnosis not present

## 2015-04-11 DIAGNOSIS — G4733 Obstructive sleep apnea (adult) (pediatric): Secondary | ICD-10-CM | POA: Diagnosis not present

## 2015-04-11 DIAGNOSIS — I10 Essential (primary) hypertension: Secondary | ICD-10-CM | POA: Diagnosis not present

## 2015-04-12 DIAGNOSIS — I110 Hypertensive heart disease with heart failure: Secondary | ICD-10-CM | POA: Diagnosis not present

## 2015-04-12 DIAGNOSIS — I509 Heart failure, unspecified: Secondary | ICD-10-CM | POA: Diagnosis not present

## 2015-04-12 DIAGNOSIS — M6281 Muscle weakness (generalized): Secondary | ICD-10-CM | POA: Diagnosis not present

## 2015-04-12 DIAGNOSIS — J069 Acute upper respiratory infection, unspecified: Secondary | ICD-10-CM | POA: Diagnosis not present

## 2015-04-12 DIAGNOSIS — R7303 Prediabetes: Secondary | ICD-10-CM | POA: Diagnosis not present

## 2015-04-12 DIAGNOSIS — M797 Fibromyalgia: Secondary | ICD-10-CM | POA: Diagnosis not present

## 2015-04-13 DIAGNOSIS — J069 Acute upper respiratory infection, unspecified: Secondary | ICD-10-CM | POA: Diagnosis not present

## 2015-04-13 DIAGNOSIS — I509 Heart failure, unspecified: Secondary | ICD-10-CM | POA: Diagnosis not present

## 2015-04-13 DIAGNOSIS — M6281 Muscle weakness (generalized): Secondary | ICD-10-CM | POA: Diagnosis not present

## 2015-04-13 DIAGNOSIS — M797 Fibromyalgia: Secondary | ICD-10-CM | POA: Diagnosis not present

## 2015-04-13 DIAGNOSIS — I110 Hypertensive heart disease with heart failure: Secondary | ICD-10-CM | POA: Diagnosis not present

## 2015-04-13 DIAGNOSIS — R7303 Prediabetes: Secondary | ICD-10-CM | POA: Diagnosis not present

## 2015-04-14 DIAGNOSIS — M797 Fibromyalgia: Secondary | ICD-10-CM | POA: Diagnosis not present

## 2015-04-14 DIAGNOSIS — I110 Hypertensive heart disease with heart failure: Secondary | ICD-10-CM | POA: Diagnosis not present

## 2015-04-14 DIAGNOSIS — M6281 Muscle weakness (generalized): Secondary | ICD-10-CM | POA: Diagnosis not present

## 2015-04-14 DIAGNOSIS — J069 Acute upper respiratory infection, unspecified: Secondary | ICD-10-CM | POA: Diagnosis not present

## 2015-04-14 DIAGNOSIS — R7303 Prediabetes: Secondary | ICD-10-CM | POA: Diagnosis not present

## 2015-04-14 DIAGNOSIS — I509 Heart failure, unspecified: Secondary | ICD-10-CM | POA: Diagnosis not present

## 2015-04-18 ENCOUNTER — Ambulatory Visit (INDEPENDENT_AMBULATORY_CARE_PROVIDER_SITE_OTHER): Payer: Medicare Other | Admitting: Interventional Cardiology

## 2015-04-18 DIAGNOSIS — R7303 Prediabetes: Secondary | ICD-10-CM | POA: Diagnosis not present

## 2015-04-18 DIAGNOSIS — J069 Acute upper respiratory infection, unspecified: Secondary | ICD-10-CM | POA: Diagnosis not present

## 2015-04-18 DIAGNOSIS — I110 Hypertensive heart disease with heart failure: Secondary | ICD-10-CM | POA: Diagnosis not present

## 2015-04-18 DIAGNOSIS — H33012 Retinal detachment with single break, left eye: Secondary | ICD-10-CM | POA: Diagnosis not present

## 2015-04-18 DIAGNOSIS — M6281 Muscle weakness (generalized): Secondary | ICD-10-CM | POA: Diagnosis not present

## 2015-04-18 DIAGNOSIS — M797 Fibromyalgia: Secondary | ICD-10-CM | POA: Diagnosis not present

## 2015-04-18 DIAGNOSIS — I4891 Unspecified atrial fibrillation: Secondary | ICD-10-CM

## 2015-04-18 DIAGNOSIS — H43812 Vitreous degeneration, left eye: Secondary | ICD-10-CM | POA: Diagnosis not present

## 2015-04-18 DIAGNOSIS — I509 Heart failure, unspecified: Secondary | ICD-10-CM | POA: Diagnosis not present

## 2015-04-18 DIAGNOSIS — H43811 Vitreous degeneration, right eye: Secondary | ICD-10-CM | POA: Diagnosis not present

## 2015-04-18 DIAGNOSIS — H33059 Total retinal detachment, unspecified eye: Secondary | ICD-10-CM | POA: Diagnosis not present

## 2015-04-18 LAB — POCT INR: INR: 3.1

## 2015-04-20 DIAGNOSIS — I110 Hypertensive heart disease with heart failure: Secondary | ICD-10-CM | POA: Diagnosis not present

## 2015-04-20 DIAGNOSIS — I509 Heart failure, unspecified: Secondary | ICD-10-CM | POA: Diagnosis not present

## 2015-04-20 DIAGNOSIS — J069 Acute upper respiratory infection, unspecified: Secondary | ICD-10-CM | POA: Diagnosis not present

## 2015-04-20 DIAGNOSIS — M797 Fibromyalgia: Secondary | ICD-10-CM | POA: Diagnosis not present

## 2015-04-20 DIAGNOSIS — R7303 Prediabetes: Secondary | ICD-10-CM | POA: Diagnosis not present

## 2015-04-20 DIAGNOSIS — M6281 Muscle weakness (generalized): Secondary | ICD-10-CM | POA: Diagnosis not present

## 2015-04-21 ENCOUNTER — Encounter: Payer: Self-pay | Admitting: Family

## 2015-04-21 DIAGNOSIS — I509 Heart failure, unspecified: Secondary | ICD-10-CM | POA: Diagnosis not present

## 2015-04-21 DIAGNOSIS — M797 Fibromyalgia: Secondary | ICD-10-CM | POA: Diagnosis not present

## 2015-04-21 DIAGNOSIS — J069 Acute upper respiratory infection, unspecified: Secondary | ICD-10-CM | POA: Diagnosis not present

## 2015-04-21 DIAGNOSIS — R7303 Prediabetes: Secondary | ICD-10-CM | POA: Diagnosis not present

## 2015-04-21 DIAGNOSIS — M6281 Muscle weakness (generalized): Secondary | ICD-10-CM | POA: Diagnosis not present

## 2015-04-21 DIAGNOSIS — I110 Hypertensive heart disease with heart failure: Secondary | ICD-10-CM | POA: Diagnosis not present

## 2015-04-22 DIAGNOSIS — M545 Low back pain: Secondary | ICD-10-CM | POA: Diagnosis not present

## 2015-04-22 DIAGNOSIS — M159 Polyosteoarthritis, unspecified: Secondary | ICD-10-CM | POA: Diagnosis not present

## 2015-04-22 DIAGNOSIS — I110 Hypertensive heart disease with heart failure: Secondary | ICD-10-CM | POA: Diagnosis not present

## 2015-04-22 DIAGNOSIS — J069 Acute upper respiratory infection, unspecified: Secondary | ICD-10-CM | POA: Diagnosis not present

## 2015-04-22 DIAGNOSIS — M79672 Pain in left foot: Secondary | ICD-10-CM | POA: Diagnosis not present

## 2015-04-22 DIAGNOSIS — M797 Fibromyalgia: Secondary | ICD-10-CM | POA: Diagnosis not present

## 2015-04-24 DIAGNOSIS — M545 Low back pain: Secondary | ICD-10-CM | POA: Diagnosis not present

## 2015-04-24 DIAGNOSIS — M79672 Pain in left foot: Secondary | ICD-10-CM | POA: Diagnosis not present

## 2015-04-24 DIAGNOSIS — M159 Polyosteoarthritis, unspecified: Secondary | ICD-10-CM | POA: Diagnosis not present

## 2015-04-24 DIAGNOSIS — M797 Fibromyalgia: Secondary | ICD-10-CM | POA: Diagnosis not present

## 2015-04-24 DIAGNOSIS — J069 Acute upper respiratory infection, unspecified: Secondary | ICD-10-CM | POA: Diagnosis not present

## 2015-04-24 DIAGNOSIS — I110 Hypertensive heart disease with heart failure: Secondary | ICD-10-CM | POA: Diagnosis not present

## 2015-04-24 DIAGNOSIS — M19072 Primary osteoarthritis, left ankle and foot: Secondary | ICD-10-CM | POA: Diagnosis not present

## 2015-04-25 ENCOUNTER — Ambulatory Visit (INDEPENDENT_AMBULATORY_CARE_PROVIDER_SITE_OTHER): Payer: Medicare Other | Admitting: Family

## 2015-04-25 ENCOUNTER — Encounter: Payer: Self-pay | Admitting: Family

## 2015-04-25 ENCOUNTER — Ambulatory Visit (HOSPITAL_COMMUNITY)
Admission: RE | Admit: 2015-04-25 | Discharge: 2015-04-25 | Disposition: A | Discharge status: Home / Self Care | Payer: Medicare Other | Source: Ambulatory Visit | Attending: Family | Admitting: Family

## 2015-04-25 VITALS — BP 154/89 | HR 86 | Temp 98.3°F | Ht 62.0 in | Wt 223.0 lb

## 2015-04-25 DIAGNOSIS — I6522 Occlusion and stenosis of left carotid artery: Secondary | ICD-10-CM

## 2015-04-25 DIAGNOSIS — I5032 Chronic diastolic (congestive) heart failure: Secondary | ICD-10-CM | POA: Insufficient documentation

## 2015-04-25 DIAGNOSIS — I6521 Occlusion and stenosis of right carotid artery: Secondary | ICD-10-CM

## 2015-04-25 DIAGNOSIS — I6523 Occlusion and stenosis of bilateral carotid arteries: Secondary | ICD-10-CM | POA: Diagnosis not present

## 2015-04-25 DIAGNOSIS — G4733 Obstructive sleep apnea (adult) (pediatric): Secondary | ICD-10-CM | POA: Insufficient documentation

## 2015-04-25 DIAGNOSIS — I110 Hypertensive heart disease with heart failure: Secondary | ICD-10-CM | POA: Diagnosis not present

## 2015-04-25 DIAGNOSIS — Z6841 Body Mass Index (BMI) 40.0 and over, adult: Secondary | ICD-10-CM | POA: Diagnosis not present

## 2015-04-25 NOTE — Patient Instructions (Signed)
Stroke Prevention Some medical conditions and behaviors are associated with an increased chance of having a stroke. You may prevent a stroke by making healthy choices and managing medical conditions. HOW CAN I REDUCE MY RISK OF HAVING A STROKE?   Stay physically active. Get at least 30 minutes of activity on most or all days.  Do not smoke. It may also be helpful to avoid exposure to secondhand smoke.  Limit alcohol use. Moderate alcohol use is considered to be:  No more than 2 drinks per day for men.  No more than 1 drink per day for nonpregnant women.  Eat healthy foods. This involves:  Eating 5 or more servings of fruits and vegetables a day.  Making dietary changes that address high blood pressure (hypertension), high cholesterol, diabetes, or obesity.  Manage your cholesterol levels.  Making food choices that are high in fiber and low in saturated fat, trans fat, and cholesterol may control cholesterol levels.  Take any prescribed medicines to control cholesterol as directed by your health care provider.  Manage your diabetes.  Controlling your carbohydrate and sugar intake is recommended to manage diabetes.  Take any prescribed medicines to control diabetes as directed by your health care provider.  Control your hypertension.  Making food choices that are low in salt (sodium), saturated fat, trans fat, and cholesterol is recommended to manage hypertension.  Ask your health care provider if you need treatment to lower your blood pressure. Take any prescribed medicines to control hypertension as directed by your health care provider.  If you are 18-39 years of age, have your blood pressure checked every 3-5 years. If you are 40 years of age or older, have your blood pressure checked every year.  Maintain a healthy weight.  Reducing calorie intake and making food choices that are low in sodium, saturated fat, trans fat, and cholesterol are recommended to manage  weight.  Stop drug abuse.  Avoid taking birth control pills.  Talk to your health care provider about the risks of taking birth control pills if you are over 35 years old, smoke, get migraines, or have ever had a blood clot.  Get evaluated for sleep disorders (sleep apnea).  Talk to your health care provider about getting a sleep evaluation if you snore a lot or have excessive sleepiness.  Take medicines only as directed by your health care provider.  For some people, aspirin or blood thinners (anticoagulants) are helpful in reducing the risk of forming abnormal blood clots that can lead to stroke. If you have the irregular heart rhythm of atrial fibrillation, you should be on a blood thinner unless there is a good reason you cannot take them.  Understand all your medicine instructions.  Make sure that other conditions (such as anemia or atherosclerosis) are addressed. SEEK IMMEDIATE MEDICAL CARE IF:   You have sudden weakness or numbness of the face, arm, or leg, especially on one side of the body.  Your face or eyelid droops to one side.  You have sudden confusion.  You have trouble speaking (aphasia) or understanding.  You have sudden trouble seeing in one or both eyes.  You have sudden trouble walking.  You have dizziness.  You have a loss of balance or coordination.  You have a sudden, severe headache with no known cause.  You have new chest pain or an irregular heartbeat. Any of these symptoms may represent a serious problem that is an emergency. Do not wait to see if the symptoms will   go away. Get medical help at once. Call your local emergency services (911 in [place] Do not drive yourself to the hospital.   This information is not intended to replace advice given to you by your health care provider. Make sure you discuss any questions you have with your health care provider.   Document Released: 03/07/2004 Document Revised: 02/18/2014 Document Reviewed:  07/31/2012 Elsevier Interactive Patient Education 2016 [employer]  

## 2015-04-25 NOTE — Progress Notes (Signed)
Chief Complaint: Extracranial Carotid Artery Stenosis   History of Present Illness  [name] is a 81 y.o. female patient of Dr. Donnetta Hutching seen for left-sided carotid stenosis with a known right ICA occlusion.  She returns today for follow up.  Patient has not had previous carotid artery intervention.  She denies any remote or recent stroke of TIA symptoms.  Denies steal symptoms.  Does not have claudication symptoms.  Tires easily since she had the PE.  Has mild neuropathy in both feet for years, attributes to back issues.  She had detatched retina repaired Dec. 2, 2014, had cataract surgery July, 2015, can see much better, no more headaches. She has Crohn's Disease and was evaluted for occult GI bleed, is anemic, states her Hg is increasing with oral iron. April, 2014 she had a left total shoulder replaced, then developed PE's, she now takes coumadin.  Her right shoulder has issues.  She has had lumbar spine surgery.  She has severe generalized OA, has affected her feet, has trouble walking. She was receiving physical therapy, is doing some walking. Is also getting some treatments to her feet (OA pain) that helps with pain and is able to walk more.   She also has fibromyalgia which is worse lately. She states her blood pressure usually runs 124-132/ 68-72. She states she has proven white coat syndrome.  She had an exacerbation of her CHF January 2016, observed in ED, did not need to be admitted.  She had her left foot injected 04/24/15 with a nerve block for pain relief. She developed a severe URI early in 2017 with a relapse, had PT to help with weakness. She saw Dr. Missy Sabins in February 2017 for borderline CHF.  HH is seeing her for CHF.  Pt Diabetic: No  Pt smoker: former smoker, quit over 25 years ago   Pt meds include:  Statin : No: cannot tolerate  Betablocker: Yes  ASA: No  Other anticoagulants/antiplatelets: coumadin    Past Medical History   Diagnosis Date  . Hypertension   . Gout   . Tinnitus   . Acid reflux disease   . Pulmonary embolism ([place]) 2011    a. Hx of PE in 02/2009 after R hip surgery, venous dopplers negative, long-term Coumadin.  . Vertigo   . Crohn disease ([place])   . Long term current use of anticoagulant   . GERD (gastroesophageal reflux disease)   . Recurrent upper respiratory infection (URI)     BRONCHITIS FEB 2013--SLIGHT COUGH NON-PRODUCTIVE NOW  . H/O hiatal hernia   . PVC (premature ventricular contraction)     PT STATES HX OF PVC'S ON EKG  . Peripheral vascular disease (HCC)     KNOWN RIGHT INTERNAL CAROTID ARTERY OCCLUSION (NO STROKE)  --40 TO 59% STENOSIS LEFT ICA-FOLLOWED BY DR. EARLY WITH DOPPLER STUDY EVERY 6 MONTHS  . Fibromyalgia   . High triglycerides   . Obstructive sleep apnea on CPAP   . Crohn's disease ([place])   . History of stomach ulcers 1950's  . Osteoarthritis     PAIN AND OA LEF T HIP AND BOTH SHOULDERS ARE BONE ON BONE AND PAINFUL  . Recurrent UTI (urinary tract infection)     "on daily medicine" (05/14/2012)  . Carotid artery occlusion   . Detached retina   . Atrial fibrillation ([place])   . Fibromyalgia   . Chronic diastolic CHF (congestive heart failure) (HCC)     Hypertensive heart disease 02-12-14    Social History  Social History  Substance Use Topics  . Smoking status: Former Smoker -- 0.50 packs/day for 15 years    Types: Cigarettes    Quit date: 02/11/1986  . Smokeless tobacco: Never Used  . Alcohol Use: 0.0 oz/week    0 Standard drinks or equivalent per week     Comment: 05/14/2012 "have 2-3 drinks/yr" maybe    Family History Family History  Problem Relation Age of Onset  . Heart disease Father     Heart Disease before age 64  . Heart attack Father 26  . Hypertension Father   . Peripheral vascular disease Father     Right  leg amputation  . Heart disease Mother     AAA  . Stroke Mother   . Aneurysm Mother   . Hypertension Mother   . Hypertension Brother    . Heart disease Brother     Heart Disease before age 64  . Hyperlipidemia Brother   . Heart attack Brother   . Hypertension Brother   . Diabetes Paternal Grandfather   . Diabetes Son     Surgical History Past Surgical History  Procedure Laterality Date  . Femur fracture surgery Right 02/21/09    Open reduction internal fixation of right periprosthetic  femur fracture utilizing Zimmer cables times fiv  . Hammer toe surgery Left 10/25/08    "toe next to big to" (05/14/2012)  . Back surgery  10/29/06    Central and foraminal decompression L3-L4, L4-L5, and L5-S1 with inspection of L4-L5 and L5-S1 disc on the right  . Knee arthroscopy Right 07/26/05  . Total hip arthroplasty Right 12/08/02    Osteonics total hip replacement  . Excision morton's neuroma Right 05/20/00    "foot" (05/14/2012)  . Total hip arthroplasty  06/17/2011    Procedure: TOTAL HIP ARTHROPLASTY;  Surgeon: Gearlean Alf, MD;  Location: [facility];  Service: Orthopedics;  Laterality: Left;  . Joint replacement  2009    Right Hip replacement  . Joint replacement  2012    Right knee replacement  . Joint replacement  06/17/11    Left Hip replacement  . Fracture surgery  2011    Right Femur Fx  . Total shoulder arthroplasty Left 05/14/2012  . Tonsillectomy and adenoidectomy  1958?  [name] Kitchen Appendectomy  1950's  . Cholecystectomy  ~ 1988  . Vaginal hysterectomy  1971  . Dilation and curettage of uterus      "several; from miscarriages" (05/14/2012)  . Decompressive lumbar laminectomy level 3  ~ 2002  . Replacement total knee Right 02/19/2010  . Cardiac catheterization  1970's    "maybe 2" (05/14/2012)  . Cardiac catheterization    . Cataract extraction w/ intraocular lens implant Right 2009  . Total shoulder arthroplasty Left 05/14/2012    Procedure: TOTAL SHOULDER ARTHROPLASTY;  Surgeon: Marin Shutter, MD;  Location: [facility];  Service: Orthopedics;  Laterality: Left;  [name] Kitchen Eye surgery Left 2014    Detached retina  . Eye surgery Left August 23, 2013    Cataract  . Spine surgery      Allergies  Allergen Reactions  . Ciprofloxacin     RASH  . Codeine     NAUSEA  . Cymbalta [Duloxetine Hcl]     Nausea VOMITING AND ABDOMINAL PAIN, HEADACHE, JUST ABOUT EVERY SIDE EFFECT THE DRUG HAS  . Diltiazem Cd [Diltiazem Hcl Er Beads]     Heavy legs, cough  . Metoprolol     Legs like cement  .  Nortriptyline     Nightmares  . Penicillins     RASH  & ITCHING   --PT STATES SHE CAN TAKE KEFLEX PO AND IV CEPHALOSPORINS  . Statins     Leg cramps  . Tetanus Toxoids     SWELLING, REDNESS  WHOLE ARM  . Lyrica [Pregabalin] Diarrhea, Nausea And Vomiting and Rash    Current Outpatient Prescriptions  Medication Sig Dispense Refill  . acetaminophen (TYLENOL) 500 MG tablet Take 500 mg by mouth daily as needed for headache.    . allopurinol (ZYLOPRIM) 300 MG tablet Take 300 mg by mouth Daily.     [name] Kitchen amLODipine (NORVASC) 5 MG tablet Take 1 tablet (5 mg total) by mouth daily. 30 tablet 6  . calcium citrate-vitamin D (CITRACAL+D) 315-200 MG-UNIT per tablet Take 1 tablet by mouth daily.    . CELEBREX 200 MG capsule Take 200 mg by mouth Daily.    . cetirizine (ZYRTEC) 10 MG tablet Take 10 mg by mouth daily.    . clobetasol cream (TEMOVATE) 4.28 % Apply 1 application topically 2 (two) times daily.    [name] Kitchen diltiazem (CARDIZEM) 60 MG tablet Take 60 mg by mouth every 6 (six) hours as needed (symptomatic AFIB).     [name] Kitchen diphenhydrAMINE (BENADRYL) 25 mg capsule Take 25 mg by mouth at bedtime as needed for sleep. Reported on 03/15/2015    . dorzolamide-timolol (COSOPT) 22.3-6.8 MG/ML ophthalmic solution Place 1 drop into both eyes 2 (two) times daily.     . fenofibrate 160 MG tablet Take 160 mg by mouth every evening.     . furosemide (LASIX) 20 MG tablet Take 20 mg by mouth. Take 1/2 tablet Monday, Wednesday, and Friday    . HYDROcodone-acetaminophen (NORCO) 10-325 MG per tablet Take 1 tablet by mouth every 6 (six) hours as needed (pain).     . indapamide (LOZOL)  2.5 MG tablet Take 1 tablet (2.5 mg total) by mouth every morning. 90 tablet 3  . ketoconazole (NIZORAL) 2 % cream Apply 1 application topically 2 (two) times daily as needed for irritation.     . Magnesium 400 MG CAPS Take 400 mg by mouth daily.     . Multiple Vitamins-Minerals (MULTIVITAMIN WITH MINERALS) tablet Take 1 tablet by mouth daily.    . nebivolol (BYSTOLIC) 10 MG tablet Take 1 tablet (10 mg total) by mouth at bedtime.    . nitrofurantoin (MACRODANTIN) 50 MG capsule Take 50 mg by mouth daily.     [name] Kitchen omeprazole (PRILOSEC) 40 MG capsule Take 40 mg by mouth daily with supper.     . potassium chloride (K-DUR) 10 MEQ tablet Take 10 mEq by mouth daily.    . vitamin C (ASCORBIC ACID) 500 MG tablet Take 500 mg by mouth daily.    [name] Kitchen warfarin (COUMADIN) 2.5 MG tablet TAKE (1) TABLET AS DIRECTED. 40 tablet 3  . [DISCONTINUED] amitriptyline (ELAVIL) 10 MG tablet Take 10 mg by mouth at bedtime.      No current facility-administered medications for this visit.    Review of Systems : See HPI for pertinent positives and negatives.  Physical Examination  Filed Vitals:   04/25/15 1332 04/25/15 1335 04/25/15 1337  BP: 154/91 141/81 154/89  Pulse: 95 86 86  Temp: 98.3 F (36.8 C)    Height: 5' 2"  (1.575 m)    Weight: 223 lb (101.152 kg)    SpO2: 94%     Body mass index is 40.78 kg/(m^2).   General: WDWN morbidly obese  female in NAD  GAIT: slow, deliberate, using cane Eyes: PERRLA  Pulmonary: CTAB, respirations are non labored Cardiac: Irregular Rhythm, no detected murmur  VASCULAR EXAM  Carotid Bruits  Left  Right    Positive  Negative    Radial pulses are 2+ palpable and equal.   LE Pulses  LEFT  RIGHT   POPLITEAL  not palpable  not palpable   DP palpable palpable   Bilateral DP pulses are palpable. Graduated compression hose are in place.   Gastrointestinal: soft, nontender, BS WNL, no r/g, no palpable masses.  Musculoskeletal:  No muscle atrophy/wasting. M/S 4/5 throughout, Extremities without ischemic changes. Unable to lift right shoulder as high as left. Neurologic: A&O X 3; Appropriate Affect, Speech is normal  CN 2-12 intact, Pain and light touch intact in extremities, Motor exam as listed above.                      Non-Invasive Vascular Imaging CAROTID DUPLEX 04/25/2015   Right ICA: confirmed occlusion. Left ICA: 40 - 59 % stenosis. No significant change compared to prior exam    Assessment: SURABHI GADEA is a 81 y.o. female who is seen for left internal carotid artery stenosis with a known right ICA occlusion. She has no history of stroke or TIA. Today's carotid Duplex suggests known occlusion of the right internal carotid artery and 40 - 59 % left ICA stenosis.   Plan: Follow-up in 1 year with Carotid Duplex scan.   I discussed in depth with the patient the nature of atherosclerosis, and emphasized the importance of maximal medical management including strict control of blood pressure, blood glucose, and lipid levels, obtaining regular exercise, and continued cessation of smoking.  The patient is aware that without maximal medical management the underlying atherosclerotic disease process will progress, limiting the benefit of any interventions. The patient was given information about stroke prevention and what symptoms should prompt the patient to seek immediate medical care. Thank you for allowing [place] to participate in this patient's care.  Clemon Chambers, RN, MSN, FNP-C [facility] Office: [phone]  Clinic Physician: Early  04/25/2015 1:28 PM

## 2015-04-27 DIAGNOSIS — M545 Low back pain: Secondary | ICD-10-CM | POA: Diagnosis not present

## 2015-04-27 DIAGNOSIS — M79672 Pain in left foot: Secondary | ICD-10-CM | POA: Diagnosis not present

## 2015-04-27 DIAGNOSIS — I110 Hypertensive heart disease with heart failure: Secondary | ICD-10-CM | POA: Diagnosis not present

## 2015-04-27 DIAGNOSIS — M797 Fibromyalgia: Secondary | ICD-10-CM | POA: Diagnosis not present

## 2015-04-27 DIAGNOSIS — J069 Acute upper respiratory infection, unspecified: Secondary | ICD-10-CM | POA: Diagnosis not present

## 2015-04-27 DIAGNOSIS — M159 Polyosteoarthritis, unspecified: Secondary | ICD-10-CM | POA: Diagnosis not present

## 2015-05-01 DIAGNOSIS — M797 Fibromyalgia: Secondary | ICD-10-CM | POA: Diagnosis not present

## 2015-05-01 DIAGNOSIS — E876 Hypokalemia: Secondary | ICD-10-CM | POA: Diagnosis not present

## 2015-05-01 DIAGNOSIS — N390 Urinary tract infection, site not specified: Secondary | ICD-10-CM | POA: Diagnosis not present

## 2015-05-01 DIAGNOSIS — M79672 Pain in left foot: Secondary | ICD-10-CM | POA: Diagnosis not present

## 2015-05-01 DIAGNOSIS — I10 Essential (primary) hypertension: Secondary | ICD-10-CM | POA: Diagnosis not present

## 2015-05-01 DIAGNOSIS — M159 Polyosteoarthritis, unspecified: Secondary | ICD-10-CM | POA: Diagnosis not present

## 2015-05-01 DIAGNOSIS — I110 Hypertensive heart disease with heart failure: Secondary | ICD-10-CM | POA: Diagnosis not present

## 2015-05-01 DIAGNOSIS — J069 Acute upper respiratory infection, unspecified: Secondary | ICD-10-CM | POA: Diagnosis not present

## 2015-05-01 DIAGNOSIS — M545 Low back pain: Secondary | ICD-10-CM | POA: Diagnosis not present

## 2015-05-02 ENCOUNTER — Ambulatory Visit (INDEPENDENT_AMBULATORY_CARE_PROVIDER_SITE_OTHER): Payer: Medicare Other | Admitting: Pharmacist

## 2015-05-02 DIAGNOSIS — I2699 Other pulmonary embolism without acute cor pulmonale: Secondary | ICD-10-CM | POA: Diagnosis not present

## 2015-05-02 DIAGNOSIS — I4891 Unspecified atrial fibrillation: Secondary | ICD-10-CM

## 2015-05-02 LAB — POCT INR: INR: 1.9

## 2015-05-03 ENCOUNTER — Ambulatory Visit: Payer: Medicare Other | Admitting: Podiatry

## 2015-05-04 DIAGNOSIS — E785 Hyperlipidemia, unspecified: Secondary | ICD-10-CM | POA: Diagnosis not present

## 2015-05-04 DIAGNOSIS — R600 Localized edema: Secondary | ICD-10-CM | POA: Diagnosis not present

## 2015-05-04 DIAGNOSIS — I509 Heart failure, unspecified: Secondary | ICD-10-CM | POA: Diagnosis not present

## 2015-05-04 DIAGNOSIS — I10 Essential (primary) hypertension: Secondary | ICD-10-CM | POA: Diagnosis not present

## 2015-05-08 DIAGNOSIS — N3020 Other chronic cystitis without hematuria: Secondary | ICD-10-CM | POA: Diagnosis not present

## 2015-05-09 DIAGNOSIS — M545 Low back pain: Secondary | ICD-10-CM | POA: Diagnosis not present

## 2015-05-09 DIAGNOSIS — J069 Acute upper respiratory infection, unspecified: Secondary | ICD-10-CM | POA: Diagnosis not present

## 2015-05-09 DIAGNOSIS — M159 Polyosteoarthritis, unspecified: Secondary | ICD-10-CM | POA: Diagnosis not present

## 2015-05-09 DIAGNOSIS — M79672 Pain in left foot: Secondary | ICD-10-CM | POA: Diagnosis not present

## 2015-05-09 DIAGNOSIS — I110 Hypertensive heart disease with heart failure: Secondary | ICD-10-CM | POA: Diagnosis not present

## 2015-05-09 DIAGNOSIS — M797 Fibromyalgia: Secondary | ICD-10-CM | POA: Diagnosis not present

## 2015-05-11 DIAGNOSIS — M79672 Pain in left foot: Secondary | ICD-10-CM | POA: Diagnosis not present

## 2015-05-11 DIAGNOSIS — I110 Hypertensive heart disease with heart failure: Secondary | ICD-10-CM | POA: Diagnosis not present

## 2015-05-11 DIAGNOSIS — M159 Polyosteoarthritis, unspecified: Secondary | ICD-10-CM | POA: Diagnosis not present

## 2015-05-11 DIAGNOSIS — J069 Acute upper respiratory infection, unspecified: Secondary | ICD-10-CM | POA: Diagnosis not present

## 2015-05-11 DIAGNOSIS — M545 Low back pain: Secondary | ICD-10-CM | POA: Diagnosis not present

## 2015-05-11 DIAGNOSIS — M797 Fibromyalgia: Secondary | ICD-10-CM | POA: Diagnosis not present

## 2015-05-15 ENCOUNTER — Ambulatory Visit (HOSPITAL_COMMUNITY)
Admission: RE | Admit: 2015-05-15 | Discharge: 2015-05-15 | Disposition: A | Discharge status: Home / Self Care | Payer: Medicare Other | Source: Ambulatory Visit | Attending: Internal Medicine | Admitting: Internal Medicine

## 2015-05-15 ENCOUNTER — Ambulatory Visit (INDEPENDENT_AMBULATORY_CARE_PROVIDER_SITE_OTHER): Payer: Medicare Other

## 2015-05-15 ENCOUNTER — Encounter (HOSPITAL_COMMUNITY): Payer: Self-pay | Admitting: Internal Medicine

## 2015-05-15 VITALS — BP 124/66 | Wt 227.0 lb

## 2015-05-15 DIAGNOSIS — Z8249 Family history of ischemic heart disease and other diseases of the circulatory system: Secondary | ICD-10-CM | POA: Insufficient documentation

## 2015-05-15 DIAGNOSIS — I5032 Chronic diastolic (congestive) heart failure: Secondary | ICD-10-CM | POA: Diagnosis not present

## 2015-05-15 DIAGNOSIS — Z79899 Other long term (current) drug therapy: Secondary | ICD-10-CM | POA: Diagnosis not present

## 2015-05-15 DIAGNOSIS — Z86711 Personal history of pulmonary embolism: Secondary | ICD-10-CM | POA: Insufficient documentation

## 2015-05-15 DIAGNOSIS — Z885 Allergy status to narcotic agent status: Secondary | ICD-10-CM | POA: Insufficient documentation

## 2015-05-15 DIAGNOSIS — I2699 Other pulmonary embolism without acute cor pulmonale: Secondary | ICD-10-CM

## 2015-05-15 DIAGNOSIS — I482 Chronic atrial fibrillation, unspecified: Secondary | ICD-10-CM

## 2015-05-15 DIAGNOSIS — M797 Fibromyalgia: Secondary | ICD-10-CM | POA: Insufficient documentation

## 2015-05-15 DIAGNOSIS — I110 Hypertensive heart disease with heart failure: Secondary | ICD-10-CM | POA: Insufficient documentation

## 2015-05-15 DIAGNOSIS — Z880 Allergy status to penicillin: Secondary | ICD-10-CM | POA: Insufficient documentation

## 2015-05-15 DIAGNOSIS — Z87891 Personal history of nicotine dependence: Secondary | ICD-10-CM | POA: Insufficient documentation

## 2015-05-15 DIAGNOSIS — I6523 Occlusion and stenosis of bilateral carotid arteries: Secondary | ICD-10-CM | POA: Insufficient documentation

## 2015-05-15 DIAGNOSIS — K219 Gastro-esophageal reflux disease without esophagitis: Secondary | ICD-10-CM | POA: Diagnosis not present

## 2015-05-15 DIAGNOSIS — G4733 Obstructive sleep apnea (adult) (pediatric): Secondary | ICD-10-CM | POA: Insufficient documentation

## 2015-05-15 DIAGNOSIS — Z7901 Long term (current) use of anticoagulants: Secondary | ICD-10-CM | POA: Insufficient documentation

## 2015-05-15 DIAGNOSIS — Z823 Family history of stroke: Secondary | ICD-10-CM | POA: Insufficient documentation

## 2015-05-15 DIAGNOSIS — K5090 Crohn's disease, unspecified, without complications: Secondary | ICD-10-CM | POA: Insufficient documentation

## 2015-05-15 DIAGNOSIS — I4891 Unspecified atrial fibrillation: Secondary | ICD-10-CM | POA: Diagnosis not present

## 2015-05-15 DIAGNOSIS — Z833 Family history of diabetes mellitus: Secondary | ICD-10-CM | POA: Diagnosis not present

## 2015-05-15 DIAGNOSIS — M109 Gout, unspecified: Secondary | ICD-10-CM | POA: Diagnosis not present

## 2015-05-15 DIAGNOSIS — Z888 Allergy status to other drugs, medicaments and biological substances status: Secondary | ICD-10-CM | POA: Diagnosis not present

## 2015-05-15 LAB — POCT INR: INR: 2

## 2015-05-15 NOTE — Progress Notes (Signed)
Patient ID: [name], female   DOB: 06/06/1934, 81 y.o.   MRN: [id number]    Advanced Heart Failure Clinic Note    Cardiology Office Note   Date:  05/15/2015   ID:  [name], DOB 04/23/1934, MRN [id number]  PCP:  Horatio Pel, MD  Cardiologist: Darlin Coco MD Referring: Dr. 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 She has been followed by Dr. Mare Ferrari and Dr. Shelia Media. Dr. Shelia Media has referred her to HF Clinic for evaluation of her HF regimen.   She was admitted to the hospital in February 2015 because of new onset atrial fibrillation. She was already on long-term Coumadin. Diltiazem for rate control was added to her regimen in the hospital. The patient has a past history of pulmonary emboli and is on long-term Coumadin. Her INR has been stable. She has a history of essential hypertension and a history of right carotid artery occlusion. She has a past history of pulmonary emboli in January 2011 after right hip surgery and at that time venous Dopplers of the legs were negative for a source of embolization and she has remained on long-term Coumadin. 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 Donnetta Hutching. She does not have any significant coronary disease and she had cardiac catheterization in 1994 showing only mild plaque and she had a nuclear stress test in December 2011 showing no ischemia and her ejection fraction was 78%.   She presents today for regular follow up. Down 6 lbs from her last visit by our scales, down 10 lbs by hers.  Has been doing the [place].  No SOB with ADLs, changing clothes, or showering. Watches her salt and fluid. Taking her lasix 10 mg M/W/F and doesn't seem to need any more than that.  Edema gets worse throughout the day, uses compression stockings  daily. No orthopnea or PND. Uses CPAP nightly. Still using HHPT, finished this week. Plans to continue doing those exercises and mainly walking.  No melena or hematochezia on warfarin   Echo 2015: - Left ventricle: The cavity size was normal. There was moderate concentric hypertrophy. Systolic function was normal. The estimated ejection fraction was in the range of 50% to 55%. Wall motion was normal; there were no regional wall motion abnormalities. The study was not technically sufficient to allow evaluation of LV diastolic dysfunction due to atrial fibrillation. - Aortic valve: Trileaflet; normal thickness leaflets. Transvalvular velocity was within the normal range. There was no stenosis. No regurgitation. - Aortic root: The aortic root was normal in size. - Mitral valve: Structurally normal valve. Trivial regurgitation. - Left atrium: The atrium was mildly dilated. - Right ventricle: Systolic function was normal. - Right atrium: The atrium was normal in size. - Tricuspid valve: No regurgitation. - Pulmonic valve: Trivial regurgitation. - Pulmonary arteries: Systolic pressure was within the normal range. - Inferior vena cava: Not visualized. - Pericardium, extracardiac: There was no pericardial effusion.  Past Medical History  Diagnosis Date  . Hypertension   . Gout   . Tinnitus   . Acid reflux disease   . Pulmonary embolism ([place]) 2011    a. Hx of PE in 02/2009 after R hip surgery, venous dopplers negative, long-term Coumadin.  . Vertigo   . Crohn disease (  [place])   . Long term current use of anticoagulant   . GERD (gastroesophageal reflux disease)   . Recurrent upper respiratory infection (URI)     BRONCHITIS FEB 2013--SLIGHT COUGH NON-PRODUCTIVE NOW  . H/O hiatal hernia   . PVC (premature ventricular contraction)     PT STATES HX OF PVC'S ON EKG  . Peripheral vascular disease (HCC)     KNOWN RIGHT INTERNAL CAROTID ARTERY OCCLUSION (NO STROKE)  --40  TO 59% STENOSIS LEFT ICA-FOLLOWED BY DR. EARLY WITH DOPPLER STUDY EVERY 6 MONTHS  . Fibromyalgia   . High triglycerides   . Obstructive sleep apnea on CPAP   . Crohn's disease ([place])   . History of stomach ulcers 1950's  . Osteoarthritis     PAIN AND OA LEF T HIP AND BOTH SHOULDERS ARE BONE ON BONE AND PAINFUL  . Recurrent UTI (urinary tract infection)     "on daily medicine" (05/14/2012)  . Carotid artery occlusion   . Detached retina   . Atrial fibrillation ([place])   . Fibromyalgia   . Chronic diastolic CHF (congestive heart failure) (HCC)     Hypertensive heart disease 02-12-14    Past Surgical History  Procedure Laterality Date  . Femur fracture surgery Right 02/21/09    Open reduction internal fixation of right periprosthetic  femur fracture utilizing Zimmer cables times fiv  . Hammer toe surgery Left 10/25/08    "toe next to big to" (05/14/2012)  . Back surgery  10/29/06    Central and foraminal decompression L3-L4, L4-L5, and L5-S1 with inspection of L4-L5 and L5-S1 disc on the right  . Knee arthroscopy Right 07/26/05  . Total hip arthroplasty Right 12/08/02    Osteonics total hip replacement  . Excision morton's neuroma Right 05/20/00    "foot" (05/14/2012)  . Total hip arthroplasty  06/17/2011    Procedure: TOTAL HIP ARTHROPLASTY;  Surgeon: Gearlean Alf, MD;  Location: [facility];  Service: Orthopedics;  Laterality: Left;  . Joint replacement  2009    Right Hip replacement  . Joint replacement  2012    Right knee replacement  . Joint replacement  06/17/11    Left Hip replacement  . Fracture surgery  2011    Right Femur Fx  . Total shoulder arthroplasty Left 05/14/2012  . Tonsillectomy and adenoidectomy  1958?  [name] Kitchen Appendectomy  1950's  . Cholecystectomy  ~ 1988  . Vaginal hysterectomy  1971  . Dilation and curettage of uterus      "several; from miscarriages" (05/14/2012)  . Decompressive lumbar laminectomy level 3  ~ 2002  . Replacement total knee Right 02/19/2010  . Cardiac  catheterization  1970's    "maybe 2" (05/14/2012)  . Cardiac catheterization    . Cataract extraction w/ intraocular lens implant Right 2009  . Total shoulder arthroplasty Left 05/14/2012    Procedure: TOTAL SHOULDER ARTHROPLASTY;  Surgeon: Marin Shutter, MD;  Location: [facility];  Service: Orthopedics;  Laterality: Left;  [name] Kitchen Eye surgery Left 2014    Detached retina  . Eye surgery Left August 23, 2013    Cataract  . Spine surgery       Current Outpatient Prescriptions  Medication Sig Dispense Refill  . acetaminophen (TYLENOL) 500 MG tablet Take 500 mg by mouth daily as needed for headache.    . allopurinol (ZYLOPRIM) 300 MG tablet Take 300 mg by mouth Daily.     [name] Kitchen amLODipine (NORVASC) 5 MG tablet Take 1 tablet (5 mg total) by mouth  daily. 30 tablet 6  . calcium citrate-vitamin D (CITRACAL+D) 315-200 MG-UNIT per tablet Take 1 tablet by mouth daily.    . CELEBREX 200 MG capsule Take 200 mg by mouth Daily.    . cetirizine (ZYRTEC) 10 MG tablet Take 10 mg by mouth daily.    . clobetasol cream (TEMOVATE) 7.37 % Apply 1 application topically 2 (two) times daily.    [name] Kitchen diltiazem (CARDIZEM) 60 MG tablet Take 60 mg by mouth every 6 (six) hours as needed (symptomatic AFIB).     [name] Kitchen diphenhydrAMINE (BENADRYL) 25 mg capsule Take 25 mg by mouth at bedtime as needed for sleep. Reported on 03/15/2015    . dorzolamide-timolol (COSOPT) 22.3-6.8 MG/ML ophthalmic solution Place 1 drop into both eyes 2 (two) times daily.     . fenofibrate 160 MG tablet Take 160 mg by mouth every evening.     . furosemide (LASIX) 20 MG tablet Take 10 mg by mouth as directed. Mondays, Wednesdays, Fridays    . HYDROcodone-acetaminophen (NORCO) 10-325 MG per tablet Take 1 tablet by mouth every 6 (six) hours as needed (pain).     . indapamide (LOZOL) 2.5 MG tablet Take 1 tablet (2.5 mg total) by mouth every morning. 90 tablet 3  . ketoconazole (NIZORAL) 2 % cream Apply 1 application topically 2 (two) times daily as needed for irritation.       . Magnesium 400 MG CAPS Take 400 mg by mouth daily.     . Multiple Vitamins-Minerals (MULTIVITAMIN WITH MINERALS) tablet Take 1 tablet by mouth daily.    . nebivolol (BYSTOLIC) 10 MG tablet Take 1 tablet (10 mg total) by mouth at bedtime.    [name] Kitchen omeprazole (PRILOSEC) 40 MG capsule Take 40 mg by mouth daily with supper.     . potassium chloride (K-DUR,KLOR-CON) 10 MEQ tablet Take 10 mEq by mouth 2 (two) times daily.    . vitamin C (ASCORBIC ACID) 500 MG tablet Take 500 mg by mouth daily.    [name] Kitchen warfarin (COUMADIN) 2.5 MG tablet TAKE (1) TABLET AS DIRECTED. 40 tablet 3  . [DISCONTINUED] amitriptyline (ELAVIL) 10 MG tablet Take 10 mg by mouth at bedtime.      No current facility-administered medications for this encounter.    Allergies:   Ciprofloxacin; Codeine; Cymbalta; Diltiazem cd; Metoprolol; Nortriptyline; Penicillins; Statins; Tetanus toxoids; and Lyrica    Social History:  The patient  reports that she quit smoking about 29 years ago. Her smoking use included Cigarettes. She has a 7.5 pack-year smoking history. She has never used smokeless tobacco. She reports that she drinks alcohol. She reports that she does not use illicit drugs.   Family History:  The patient's family history includes Aneurysm in her mother; Diabetes in her paternal grandfather and son; Heart attack in her brother; Heart attack (age of onset: 25) in her father; Heart disease in her brother, father, and mother; Hyperlipidemia in her brother; Hypertension in her brother, brother, father, and mother; Peripheral vascular disease in her father; Stroke in her mother.    ROS:  Please see the history of present illness.   Otherwise, review of systems are positive for none.   All other systems are reviewed and negative.    PHYSICAL EXAM:  Filed Vitals:   05/15/15 1448  BP: 124/66  Weight: 227 lb (102.967 kg)   GEN: Well nourished, well developed, in no acute distress HEENT: normal Neck: no JVD, carotid bruits, or  masses Cardiac: Irregularly irregular; no murmurs, rubs, or gallops, trace  edema  Respiratory:  clear to auscultation bilaterally, normal work of breathing GI: marked central obesity. soft, nontender, nondistended, + BS MS: no deformity or atrophy Skin: warm and dry, no rash Neuro:  Strength and sensation are intact Psych: euthymic mood, full affect  Lipid Panel No results found for: CHOL, TRIG, HDL, CHOLHDL, VLDL, LDLCALC, LDLDIRECT    Wt Readings from Last 3 Encounters:  05/15/15 227 lb (102.967 kg)  04/25/15 223 lb (101.152 kg)  03/15/15 233 lb 12 oz (106.028 kg)    ASSESSMENT AND PLAN:  1. Chronic diastolic HF -Volume status looks pretty good despite her only wanting to use low dose lasix.  -Reinforced need for daily weights and reviewed use of sliding scale diuretics. 2. Chronic atrial fibrillation on long-term anticoagulation.  - Doing well on rate control and warfarin - Continue Bystolic.  3. H/o prior pulmonary emboli - Continue lifelong warfarin 4. Essential hypertension  -Controlled 5. Morbid obesity - Continue weight loss.  - [place].   Shirley Friar, PA-C 2:52 PM  Patient seen and examined with Oda Kilts, PA-C. We discussed all aspects of the encounter. I agree with the assessment and plan as stated above.   Volume status improved. Main issue remains need for weight loss. She has started the [place]. We discussed this and  Challenged her to lose 25 pounds in 6 months (1 pound per week). We will see her back then. Reinforced need for daily weights and reviewed use of sliding scale diuretics. Continue coumadin for chronic AF and h/o PE.   Bensimhon, Daniel,MD 10:08 PM

## 2015-05-15 NOTE — Patient Instructions (Signed)
We will contact you in 6 months to schedule your next appointment.  

## 2015-05-17 DIAGNOSIS — M545 Low back pain: Secondary | ICD-10-CM | POA: Diagnosis not present

## 2015-05-17 DIAGNOSIS — M797 Fibromyalgia: Secondary | ICD-10-CM | POA: Diagnosis not present

## 2015-05-17 DIAGNOSIS — I110 Hypertensive heart disease with heart failure: Secondary | ICD-10-CM | POA: Diagnosis not present

## 2015-05-17 DIAGNOSIS — M159 Polyosteoarthritis, unspecified: Secondary | ICD-10-CM | POA: Diagnosis not present

## 2015-05-17 DIAGNOSIS — J069 Acute upper respiratory infection, unspecified: Secondary | ICD-10-CM | POA: Diagnosis not present

## 2015-05-17 DIAGNOSIS — M79672 Pain in left foot: Secondary | ICD-10-CM | POA: Diagnosis not present

## 2015-05-18 DIAGNOSIS — M79672 Pain in left foot: Secondary | ICD-10-CM | POA: Diagnosis not present

## 2015-05-18 DIAGNOSIS — M797 Fibromyalgia: Secondary | ICD-10-CM | POA: Diagnosis not present

## 2015-05-18 DIAGNOSIS — M159 Polyosteoarthritis, unspecified: Secondary | ICD-10-CM | POA: Diagnosis not present

## 2015-05-18 DIAGNOSIS — M545 Low back pain: Secondary | ICD-10-CM | POA: Diagnosis not present

## 2015-05-24 ENCOUNTER — Encounter: Payer: Self-pay | Admitting: Podiatry

## 2015-05-24 ENCOUNTER — Ambulatory Visit (INDEPENDENT_AMBULATORY_CARE_PROVIDER_SITE_OTHER): Payer: Medicare Other | Admitting: Podiatry

## 2015-05-24 VITALS — BP 140/70 | HR 94 | Resp 18

## 2015-05-24 DIAGNOSIS — L84 Corns and callosities: Secondary | ICD-10-CM

## 2015-05-24 NOTE — Patient Instructions (Signed)
Continue to wear your custom molded shoe inserts in athletic style shoes and ongoing daily basis

## 2015-05-25 NOTE — Progress Notes (Signed)
Patient ID: LOWEN MANSOURI, female   DOB: 24-Dec-1934, 81 y.o.   MRN: [id number]  Subjective: This patient presents again for schedule visit for debridement of pre-ulcerative plantar callus on the plantar aspect left first MPJ. This area has episodes of superficial ulceration. Patient continues to wear athletic style shoes with accommodative shoe inserts with pocket accommodations to offload the plantar left first MPJ as well as additional padding fifth deepen the pocket  Objective: Orientated 5 female patient presents with her husband and treatment room  Plantar aspect left first MPJ has reactive callus without any surrounding erythema, edema, drainage with bleeding within the callus  Assessment: Pre-ulcerative plantar callus left first MPJ  Plan: Debride plantar callus left first MPJ without any bleeding   Reappoint 8 weeks

## 2015-06-01 DIAGNOSIS — J069 Acute upper respiratory infection, unspecified: Secondary | ICD-10-CM | POA: Diagnosis not present

## 2015-06-06 ENCOUNTER — Ambulatory Visit (INDEPENDENT_AMBULATORY_CARE_PROVIDER_SITE_OTHER): Payer: Medicare Other | Admitting: *Deleted

## 2015-06-06 DIAGNOSIS — I2699 Other pulmonary embolism without acute cor pulmonale: Secondary | ICD-10-CM | POA: Diagnosis not present

## 2015-06-06 DIAGNOSIS — I4891 Unspecified atrial fibrillation: Secondary | ICD-10-CM | POA: Diagnosis not present

## 2015-06-06 LAB — POCT INR: INR: 2.7

## 2015-06-08 ENCOUNTER — Other Ambulatory Visit: Payer: Self-pay

## 2015-06-08 DIAGNOSIS — J069 Acute upper respiratory infection, unspecified: Secondary | ICD-10-CM | POA: Diagnosis not present

## 2015-06-08 MED ORDER — WARFARIN SODIUM 2.5 MG PO TABS: ORAL_TABLET | ORAL | Status: DC

## 2015-06-15 DIAGNOSIS — Z96643 Presence of artificial hip joint, bilateral: Secondary | ICD-10-CM | POA: Diagnosis not present

## 2015-06-15 DIAGNOSIS — Z471 Aftercare following joint replacement surgery: Secondary | ICD-10-CM | POA: Diagnosis not present

## 2015-06-15 DIAGNOSIS — Z96651 Presence of right artificial knee joint: Secondary | ICD-10-CM | POA: Diagnosis not present

## 2015-07-03 DIAGNOSIS — M19072 Primary osteoarthritis, left ankle and foot: Secondary | ICD-10-CM | POA: Diagnosis not present

## 2015-07-04 ENCOUNTER — Ambulatory Visit (INDEPENDENT_AMBULATORY_CARE_PROVIDER_SITE_OTHER): Payer: Medicare Other | Admitting: Pharmacist

## 2015-07-04 DIAGNOSIS — I4891 Unspecified atrial fibrillation: Secondary | ICD-10-CM | POA: Diagnosis not present

## 2015-07-04 DIAGNOSIS — I2699 Other pulmonary embolism without acute cor pulmonale: Secondary | ICD-10-CM

## 2015-07-04 LAB — POCT INR: INR: 2.8

## 2015-07-05 ENCOUNTER — Telehealth: Payer: Self-pay | Admitting: Cardiovascular Disease

## 2015-07-05 ENCOUNTER — Telehealth: Payer: Self-pay | Admitting: *Deleted

## 2015-07-05 ENCOUNTER — Telehealth: Payer: Self-pay | Admitting: Cardiology

## 2015-07-05 NOTE — Telephone Encounter (Signed)
Spoke with Dr. Philipp Ovens ([facility]) & he stated the pt was in the office & just had a tooth extraction & she has been bleeding since extraction (still in office). He stated he has tried many interventions & wanted [facility] to give the pt a Vitamin K shot. Instructed Dr. Philipp Ovens that we do not give Vitamin K shots but we can monitor INR's & dose Coumadin doses & if he thinks pt needs a Vit K shot he should send pt to ER.  Also, advised that the pt should report to ER with post op extraction bleeding for management.  Advised that we can check INR if needed & he stated he would try another intervention such as a tea bag to help cease the bleeding.

## 2015-07-05 NOTE — Telephone Encounter (Signed)
Pt states that she spoke with someone at the Coumadin Clinic at [facility] office & she was given instructions to follow.  Pt states that the bleeding has slowed down & better than initially reported while in the Dentist(dr. Blackwell's office).  Also, the pt states she has made an appt to follow-up at [facility] office on Friday, May 26th as instructed.

## 2015-07-05 NOTE — Telephone Encounter (Signed)
Discussed ongoing bleeding at site of dental root extraction with her dentist Dr. Philipp Ovens. Despite local pressure and even a suture the bleeding continues. INR yesterday was 2.8 and warfarin dose is reduced in half. Recommended complete cessation of warfarin with follow-up in the Coumadin clinic this Friday May 26.

## 2015-07-07 ENCOUNTER — Ambulatory Visit (INDEPENDENT_AMBULATORY_CARE_PROVIDER_SITE_OTHER): Payer: Medicare Other | Admitting: Pharmacist Clinician (PhC)/ Clinical Pharmacy Specialist

## 2015-07-07 DIAGNOSIS — I2699 Other pulmonary embolism without acute cor pulmonale: Secondary | ICD-10-CM

## 2015-07-07 DIAGNOSIS — I4891 Unspecified atrial fibrillation: Secondary | ICD-10-CM

## 2015-07-07 LAB — POCT INR: INR: 1.8

## 2015-07-12 NOTE — Telephone Encounter (Signed)
Done

## 2015-07-12 NOTE — Telephone Encounter (Signed)
Discussed ongoing gingival bleeding after tooth extraction with her dental surgeon. Recommended against vitamin K. Try local pressure,  ice tea-bags and amicar swish and spit if available

## 2015-07-20 ENCOUNTER — Ambulatory Visit (INDEPENDENT_AMBULATORY_CARE_PROVIDER_SITE_OTHER): Payer: Medicare Other | Admitting: Pharmacist

## 2015-07-20 DIAGNOSIS — I2699 Other pulmonary embolism without acute cor pulmonale: Secondary | ICD-10-CM | POA: Diagnosis not present

## 2015-07-20 DIAGNOSIS — I4891 Unspecified atrial fibrillation: Secondary | ICD-10-CM | POA: Diagnosis not present

## 2015-07-20 LAB — POCT INR: INR: 1.9

## 2015-07-26 ENCOUNTER — Encounter: Payer: Self-pay | Admitting: Podiatry

## 2015-07-26 ENCOUNTER — Ambulatory Visit (INDEPENDENT_AMBULATORY_CARE_PROVIDER_SITE_OTHER): Payer: Medicare Other | Admitting: Podiatry

## 2015-07-26 VITALS — BP 133/75 | HR 86 | Resp 16

## 2015-07-26 DIAGNOSIS — L84 Corns and callosities: Secondary | ICD-10-CM | POA: Diagnosis not present

## 2015-07-26 NOTE — Progress Notes (Signed)
   Subjective:    Patient ID: [name], female    DOB: Jan 06, 1935, 81 y.o.   MRN: [id number]  HPI  Subjective: This patient presents again for schedule visit for debridement of pre-ulcerative plantar callus on the plantar aspect left first MPJ. This area has episodes of superficial ulceration. Patient continues to wear athletic style shoes with accommodative shoe inserts with pocket accommodations to offload the plantar left first MPJ as well as additional padding fifth deepen the pocket  Review of Systems  All other systems reviewed and are negative.      Objective:   Physical Exam  Objective: Orientated 5 female patient presents with her husband and treatment room  Plantar aspect left first MPJ has reactive callus without any surrounding erythema, edema, drainage with bleeding within the callus        Assessment & Plan:   Assessment: Pre-ulcerative plantar callus left first MPJ  Plan: Debride plantar callus left first MPJ without any bleeding Patient will continue wearing custom foot orthotics offloading the plantar left first MPJ with additional padding attach to this custom foot orthotic  Reappoint 4 weeks

## 2015-07-26 NOTE — Patient Instructions (Signed)
Where your custom insoles in your athletic shoes and ongoing daily basis Add additional padding as needed for comfort to offload the callus area in the left foot

## 2015-08-11 ENCOUNTER — Ambulatory Visit (INDEPENDENT_AMBULATORY_CARE_PROVIDER_SITE_OTHER): Payer: Medicare Other | Admitting: Pharmacist

## 2015-08-11 ENCOUNTER — Encounter: Payer: Self-pay | Admitting: Cardiology

## 2015-08-11 ENCOUNTER — Ambulatory Visit (INDEPENDENT_AMBULATORY_CARE_PROVIDER_SITE_OTHER): Payer: Medicare Other | Admitting: Cardiology

## 2015-08-11 VITALS — BP 166/83 | HR 112 | Ht 62.0 in | Wt 232.4 lb

## 2015-08-11 DIAGNOSIS — I482 Chronic atrial fibrillation, unspecified: Secondary | ICD-10-CM

## 2015-08-11 DIAGNOSIS — I2699 Other pulmonary embolism without acute cor pulmonale: Secondary | ICD-10-CM

## 2015-08-11 DIAGNOSIS — I10 Essential (primary) hypertension: Secondary | ICD-10-CM

## 2015-08-11 DIAGNOSIS — I5032 Chronic diastolic (congestive) heart failure: Secondary | ICD-10-CM

## 2015-08-11 DIAGNOSIS — I4891 Unspecified atrial fibrillation: Secondary | ICD-10-CM

## 2015-08-11 DIAGNOSIS — I6521 Occlusion and stenosis of right carotid artery: Secondary | ICD-10-CM

## 2015-08-11 LAB — POCT INR: INR: 2.4

## 2015-08-11 NOTE — Progress Notes (Signed)
Cardiology Office Note   Date:  08/11/2015   ID:  [name], DOB 1934/09/30, MRN [id number]  PCP:  Horatio Pel, MD  Cardiologist: Chistine Dematteo Martinique MD  No chief complaint on file.     History of Present Illness: [name] is a 81 y.o. female who presents for follow up CHF and atrial fibrillation. She is a former patient of Dr. Mare Ferrari.   She was admitted to the hospital in February 2015 because of new onset atrial fibrillation. She was already on long-term Coumadin. Diltiazem for rate control was added to her regimen in the hospital. The patient has a past history of pulmonary emboli and is on long-term Coumadin. She has a history of essential hypertension and a history of right carotid artery occlusion. 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 Donnetta Hutching. She does not have any significant coronary disease and she had cardiac catheterization in 1994 showing only mild plaque and she had a nuclear stress test in December 2011 showing no ischemia and her ejection fraction was 78%. She is actively followed in the CHF clinic.  She is on Lozol daily and she takes low dose furosemide about every other day. She states lasix really makes her feel wiped out. Her weight is quite variable but responds readily to lasix. She states that at home her pulse is usually between 88-92. Sats this am 94%. BP 134/76 this am at home. She does have chronic fatigue and is fairly sedentary. She does use CPAP therapy for OSA.    Past Medical History  Diagnosis Date  . Hypertension   . Gout   . Tinnitus   . Acid reflux disease   . Pulmonary embolism ([place]) 2011    a. Hx of PE in 02/2009 after R hip surgery, venous dopplers negative, long-term Coumadin.  . Vertigo   . Crohn disease ([place])   . Long term current use of anticoagulant   . GERD (gastroesophageal reflux disease)   . Recurrent upper respiratory infection (URI)       BRONCHITIS FEB 2013--SLIGHT COUGH NON-PRODUCTIVE NOW  . H/O hiatal hernia   . PVC (premature ventricular contraction)     PT STATES HX OF PVC'S ON EKG  . Peripheral vascular disease (HCC)     KNOWN RIGHT INTERNAL CAROTID ARTERY OCCLUSION (NO STROKE)  --40 TO 59% STENOSIS LEFT ICA-FOLLOWED BY DR. EARLY WITH DOPPLER STUDY EVERY 6 MONTHS  . Fibromyalgia   . High triglycerides   . Obstructive sleep apnea on CPAP   . Crohn's disease ([place])   . History of stomach ulcers 1950's  . Osteoarthritis     PAIN AND OA LEF T HIP AND BOTH SHOULDERS ARE BONE ON BONE AND PAINFUL  . Recurrent UTI (urinary tract infection)     "on daily medicine" (05/14/2012)  . Carotid artery occlusion   . Detached retina   . Atrial fibrillation ([place])   . Fibromyalgia   . Chronic diastolic CHF (congestive heart failure) (HCC)     Hypertensive heart disease 02-12-14    Past Surgical History  Procedure Laterality Date  . Femur fracture surgery Right 02/21/09    Open reduction internal fixation of right periprosthetic  femur fracture utilizing Zimmer cables times fiv  . Hammer toe surgery Left 10/25/08    "toe next to big to" (05/14/2012)  . Back surgery  10/29/06    Central and foraminal decompression  L3-L4, L4-L5, and L5-S1 with inspection of L4-L5 and L5-S1 disc on the right  . Knee arthroscopy Right 07/26/05  . Total hip arthroplasty Right 12/08/02    Osteonics total hip replacement  . Excision morton's neuroma Right 05/20/00    "foot" (05/14/2012)  . Total hip arthroplasty  06/17/2011    Procedure: TOTAL HIP ARTHROPLASTY;  Surgeon: Gearlean Alf, MD;  Location: [facility];  Service: Orthopedics;  Laterality: Left;  . Joint replacement  2009    Right Hip replacement  . Joint replacement  2012    Right knee replacement  . Joint replacement  06/17/11    Left Hip replacement  . Fracture surgery  2011    Right Femur Fx  . Total shoulder arthroplasty Left 05/14/2012  . Tonsillectomy and adenoidectomy  1958?  [name] Kitchen Appendectomy   1950's  . Cholecystectomy  ~ 1988  . Vaginal hysterectomy  1971  . Dilation and curettage of uterus      "several; from miscarriages" (05/14/2012)  . Decompressive lumbar laminectomy level 3  ~ 2002  . Replacement total knee Right 02/19/2010  . Cardiac catheterization  1970's    "maybe 2" (05/14/2012)  . Cardiac catheterization    . Cataract extraction w/ intraocular lens implant Right 2009  . Total shoulder arthroplasty Left 05/14/2012    Procedure: TOTAL SHOULDER ARTHROPLASTY;  Surgeon: Marin Shutter, MD;  Location: [facility];  Service: Orthopedics;  Laterality: Left;  [name] Kitchen Eye surgery Left 2014    Detached retina  . Eye surgery Left August 23, 2013    Cataract  . Spine surgery       Current Outpatient Prescriptions  Medication Sig Dispense Refill  . acetaminophen (TYLENOL) 500 MG tablet Take 500 mg by mouth daily as needed for headache.    . allopurinol (ZYLOPRIM) 300 MG tablet Take 300 mg by mouth Daily.     [name] Kitchen amLODipine (NORVASC) 5 MG tablet Take 1 tablet (5 mg total) by mouth daily. 30 tablet 6  . calcium citrate-vitamin D (CITRACAL+D) 315-200 MG-UNIT per tablet Take 1 tablet by mouth daily.    . CELEBREX 200 MG capsule Take 200 mg by mouth Daily.    . cetirizine (ZYRTEC) 10 MG tablet Take 10 mg by mouth daily.    . clobetasol cream (TEMOVATE) 4.13 % Apply 1 application topically 2 (two) times daily.    [name] Kitchen diltiazem (CARDIZEM) 60 MG tablet Take 60 mg by mouth every 6 (six) hours as needed (symptomatic AFIB).     [name] Kitchen diphenhydrAMINE (BENADRYL) 25 mg capsule Take 25 mg by mouth at bedtime as needed for sleep. Reported on 03/15/2015    . dorzolamide-timolol (COSOPT) 22.3-6.8 MG/ML ophthalmic solution Place 1 drop into both eyes 2 (two) times daily.     . fenofibrate 160 MG tablet Take 160 mg by mouth every evening.     . furosemide (LASIX) 20 MG tablet Take 10 mg by mouth as directed. Mondays, Wednesdays, Fridays    . HYDROcodone-acetaminophen (NORCO) 10-325 MG per tablet Take 1 tablet by mouth  every 6 (six) hours as needed (pain).     . indapamide (LOZOL) 2.5 MG tablet Take 1 tablet (2.5 mg total) by mouth every morning. 90 tablet 3  . ketoconazole (NIZORAL) 2 % cream Apply 1 application topically 2 (two) times daily as needed for irritation.     . Magnesium 400 MG CAPS Take 400 mg by mouth daily.     . Multiple Vitamins-Minerals (MULTIVITAMIN WITH MINERALS) tablet Take 1 tablet  by mouth daily.    . nebivolol (BYSTOLIC) 10 MG tablet Take 1 tablet (10 mg total) by mouth at bedtime.    [name] Kitchen omeprazole (PRILOSEC) 40 MG capsule Take 40 mg by mouth daily with supper.     . potassium chloride (K-DUR,KLOR-CON) 10 MEQ tablet Take 10 mEq by mouth 2 (two) times daily.    . vitamin C (ASCORBIC ACID) 500 MG tablet Take 500 mg by mouth daily.    [name] Kitchen warfarin (COUMADIN) 2.5 MG tablet TAKE (1) TABLET AS DIRECTED OR AS DIRECTED BY COUMADIN CLINIC. 40 tablet 3  . [DISCONTINUED] amitriptyline (ELAVIL) 10 MG tablet Take 10 mg by mouth at bedtime.      No current facility-administered medications for this visit.    Allergies:   Ciprofloxacin; Codeine; Cymbalta; Diltiazem cd; Metoprolol; Nortriptyline; Penicillins; Statins; Tetanus toxoids; and Lyrica    Social History:  The patient  reports that she quit smoking about 29 years ago. Her smoking use included Cigarettes. She has a 7.5 pack-year smoking history. She has never used smokeless tobacco. She reports that she drinks alcohol. She reports that she does not use illicit drugs.   Family History:  The patient's family history includes Aneurysm in her mother; Diabetes in her paternal grandfather and son; Heart attack in her brother; Heart attack (age of onset: 91) in her father; Heart disease in her brother, father, and mother; Hyperlipidemia in her brother; Hypertension in her brother, brother, father, and mother; Peripheral vascular disease in her father; Stroke in her mother.    ROS:  Please see the history of present illness.   Otherwise, review of  systems are positive for none.   All other systems are reviewed and negative.    PHYSICAL EXAM: VS:  There were no vitals taken for this visit. , BMI There is no weight on file to calculate BMI. GEN: Well nourished, morbidly obese, in no acute distress HEENT: normal Neck: no JVD, carotid bruits, or masses Cardiac: Irregularly irregular; no murmurs, rubs, or gallops,no edema  Respiratory:  clear to auscultation bilaterally, normal work of breathing GI: soft, nontender, nondistended, + BS MS: no deformity or atrophy Skin: warm and dry, no rash Neuro:  Strength and sensation are intact Psych: euthymic mood, full affect   EKG:  EKG is ordered today. Atrial fibrillation with rate 112. RAD. ST-T changes consistent with inferolateral ischemia. Low voltage. I have personally reviewed and interpreted this study.     Recent Labs: No results found for requested labs within last 365 days.    Lipid Panel No results found for: CHOL, TRIG, HDL, CHOLHDL, VLDL, LDLCALC, LDLDIRECT    Wt Readings from Last 3 Encounters:  05/15/15 227 lb (102.967 kg)  04/25/15 223 lb (101.152 kg)  03/15/15 233 lb 12 oz (106.028 kg)        ASSESSMENT AND PLAN:  1. Permanent atrial fibrillation on long-term anticoagulation. Doing well on rate control and long-term anticoagulation. Initial rate elevated this am. Repeat by me 100. Continue current therapy 2. History of pulmonary emboli, on warfarin 3. Essential hypertension with intolerance to metoprolol. Currently well controlled.  4. Chronic diastolic CHF. She does not tolerate Lasix on a maintenance schedule. Will continue lozol daily. Lasix 3 x/wk with extra as needed for weight gain or increased swelling. Sodium restriction.  5. Morbid obesity. Encourage weight loss with carb restriction.    Current medicines are reviewed at length with the patient today.  The patient does not have concerns regarding medicines.  The following  changes have been made:   no change  Labs/ tests ordered today include:  No orders of the defined types were placed in this encounter.     Disposition: Continue current medication.    Continue to work on trying to lose weight. Follow up in 4 months.  Signed, Zubin Pontillo Martinique MD, [facility]    08/11/2015 7:41 AM     Phone: [phone]; Fax: [phone]

## 2015-08-11 NOTE — Patient Instructions (Addendum)
Continue your current therapy  Take extra lasix as needed for weight gain or increased swelling  I will see you in 4 months

## 2015-08-14 DIAGNOSIS — J069 Acute upper respiratory infection, unspecified: Secondary | ICD-10-CM | POA: Diagnosis not present

## 2015-08-14 DIAGNOSIS — R05 Cough: Secondary | ICD-10-CM | POA: Diagnosis not present

## 2015-08-16 ENCOUNTER — Telehealth: Payer: Self-pay | Admitting: Pharmacist

## 2015-08-16 DIAGNOSIS — I509 Heart failure, unspecified: Secondary | ICD-10-CM | POA: Diagnosis not present

## 2015-08-16 DIAGNOSIS — J40 Bronchitis, not specified as acute or chronic: Secondary | ICD-10-CM | POA: Diagnosis not present

## 2015-08-16 DIAGNOSIS — R05 Cough: Secondary | ICD-10-CM | POA: Diagnosis not present

## 2015-08-16 DIAGNOSIS — D649 Anemia, unspecified: Secondary | ICD-10-CM | POA: Diagnosis not present

## 2015-08-16 NOTE — Telephone Encounter (Signed)
Pt called because starting Zpak and steroid for bronchitis today. Will move appt to check sooner. Appt moved to Monday 7/10.

## 2015-08-18 DIAGNOSIS — D649 Anemia, unspecified: Secondary | ICD-10-CM | POA: Diagnosis not present

## 2015-08-18 DIAGNOSIS — J40 Bronchitis, not specified as acute or chronic: Secondary | ICD-10-CM | POA: Diagnosis not present

## 2015-08-18 DIAGNOSIS — R05 Cough: Secondary | ICD-10-CM | POA: Diagnosis not present

## 2015-08-21 ENCOUNTER — Ambulatory Visit (INDEPENDENT_AMBULATORY_CARE_PROVIDER_SITE_OTHER): Payer: Medicare Other | Admitting: Pharmacist

## 2015-08-21 DIAGNOSIS — I2699 Other pulmonary embolism without acute cor pulmonale: Secondary | ICD-10-CM

## 2015-08-21 DIAGNOSIS — I4891 Unspecified atrial fibrillation: Secondary | ICD-10-CM

## 2015-08-21 LAB — POCT INR: INR: 1.7

## 2015-08-23 ENCOUNTER — Ambulatory Visit (INDEPENDENT_AMBULATORY_CARE_PROVIDER_SITE_OTHER): Payer: Medicare Other | Admitting: Podiatry

## 2015-08-23 ENCOUNTER — Encounter: Payer: Self-pay | Admitting: Podiatry

## 2015-08-23 VITALS — BP 142/88 | HR 69 | Resp 12

## 2015-08-23 DIAGNOSIS — Z1212 Encounter for screening for malignant neoplasm of rectum: Secondary | ICD-10-CM | POA: Diagnosis not present

## 2015-08-23 DIAGNOSIS — L84 Corns and callosities: Secondary | ICD-10-CM

## 2015-08-23 NOTE — Progress Notes (Signed)
Patient ID: [name], female   DOB: 13-Jan-1935, 81 y.o.   MRN: [id number]   Subjective: This patient presents again for schedule visit for debridement of pre-ulcerative plantar callus on the plantar aspect left first MPJ. This area has episodes of superficial ulceration. Patient continues to wear athletic style shoes with accommodative shoe inserts with pocket accommodations to offload the plantar left first MPJ as well as additional padding fifth deepen the pocket  Review of Systems  All other systems reviewed and are negative.      Objective:   Physical Exam  Objective: Orientated 3 female patient presents with her husband and treatment room  Plantar aspect left first MPJ has reactive callus without any surrounding erythema, edema, drainage with bleeding within the callus        Assessment & Plan:   Assessment: Pre-ulcerative plantar callus left first MPJ  Plan: Debride plantar callus left first MPJ without any bleeding Patient will continue wearing custom foot orthotics offloading the plantar left first MPJ with additional padding attach to this custom foot orthotic  Reappoint 4 weeks

## 2015-08-23 NOTE — Patient Instructions (Signed)
Wear year custom foot orthotics in athletic shoes on an ongoing continuous basis

## 2015-09-04 DIAGNOSIS — E876 Hypokalemia: Secondary | ICD-10-CM | POA: Diagnosis not present

## 2015-09-04 DIAGNOSIS — I509 Heart failure, unspecified: Secondary | ICD-10-CM | POA: Diagnosis not present

## 2015-09-04 DIAGNOSIS — D649 Anemia, unspecified: Secondary | ICD-10-CM | POA: Diagnosis not present

## 2015-09-05 ENCOUNTER — Ambulatory Visit (INDEPENDENT_AMBULATORY_CARE_PROVIDER_SITE_OTHER): Payer: Medicare Other | Admitting: Pharmacist

## 2015-09-05 DIAGNOSIS — I4891 Unspecified atrial fibrillation: Secondary | ICD-10-CM | POA: Diagnosis not present

## 2015-09-05 DIAGNOSIS — I2699 Other pulmonary embolism without acute cor pulmonale: Secondary | ICD-10-CM | POA: Diagnosis not present

## 2015-09-05 LAB — POCT INR: INR: 2

## 2015-09-19 DIAGNOSIS — M79672 Pain in left foot: Secondary | ICD-10-CM | POA: Diagnosis not present

## 2015-09-19 DIAGNOSIS — M25572 Pain in left ankle and joints of left foot: Secondary | ICD-10-CM | POA: Diagnosis not present

## 2015-09-19 DIAGNOSIS — M19072 Primary osteoarthritis, left ankle and foot: Secondary | ICD-10-CM | POA: Diagnosis not present

## 2015-09-20 ENCOUNTER — Encounter: Payer: Self-pay | Admitting: Podiatry

## 2015-09-20 ENCOUNTER — Ambulatory Visit (INDEPENDENT_AMBULATORY_CARE_PROVIDER_SITE_OTHER): Payer: Medicare Other | Admitting: Podiatry

## 2015-09-20 DIAGNOSIS — L84 Corns and callosities: Secondary | ICD-10-CM

## 2015-09-21 NOTE — Progress Notes (Signed)
Patient ID: BRITAIN Stanley, female   DOB: 1935-02-08, 81 y.o.   MRN: [id number]    Subjective: This patient presents again for schedule visit for debridement of pre-ulcerative plantar callus on the plantar aspect left first MPJ. This area has episodes of superficial ulceration. Patient continues to wear athletic style shoes with accommodative shoe inserts with pocket accommodations to offload the plantar left first MPJ as well as additional padding fifth deepen the pocket Patient's husband present and treatment room today  Objective: Orientated 3  Plantar aspect left first MPJ has reactive callus without any surrounding erythema, edema, drainage with bleeding within the callus  Assessment: Pre-ulcerative plantar callus left first MPJ  Plan: Debride plantar callus left first MPJ without any bleeding   Reappoint 4 weeks

## 2015-09-26 ENCOUNTER — Ambulatory Visit (INDEPENDENT_AMBULATORY_CARE_PROVIDER_SITE_OTHER): Payer: Medicare Other | Admitting: Pharmacist

## 2015-09-26 DIAGNOSIS — I4891 Unspecified atrial fibrillation: Secondary | ICD-10-CM | POA: Diagnosis not present

## 2015-09-26 DIAGNOSIS — I2699 Other pulmonary embolism without acute cor pulmonale: Secondary | ICD-10-CM

## 2015-09-26 LAB — POCT INR: INR: 1.8

## 2015-10-11 ENCOUNTER — Ambulatory Visit (INDEPENDENT_AMBULATORY_CARE_PROVIDER_SITE_OTHER): Payer: Medicare Other | Admitting: Pharmacist

## 2015-10-11 DIAGNOSIS — I4891 Unspecified atrial fibrillation: Secondary | ICD-10-CM

## 2015-10-11 DIAGNOSIS — I2699 Other pulmonary embolism without acute cor pulmonale: Secondary | ICD-10-CM

## 2015-10-11 LAB — POCT INR: INR: 2.1

## 2015-10-18 ENCOUNTER — Encounter: Payer: Self-pay | Admitting: Podiatry

## 2015-10-18 ENCOUNTER — Ambulatory Visit (INDEPENDENT_AMBULATORY_CARE_PROVIDER_SITE_OTHER): Payer: Medicare Other | Admitting: Podiatry

## 2015-10-18 DIAGNOSIS — Z23 Encounter for immunization: Secondary | ICD-10-CM | POA: Diagnosis not present

## 2015-10-18 DIAGNOSIS — L84 Corns and callosities: Secondary | ICD-10-CM | POA: Diagnosis not present

## 2015-10-18 NOTE — Progress Notes (Signed)
Patient ID: [name], female   DOB: Dec 23, 1934, 81 y.o.   MRN: [id number]    Subjective: This patient presents again for schedule visit for debridement of pre-ulcerative plantar callus on the plantar aspect left first MPJ. This area has episodes of superficial ulceration. Patient continues to wear athletic style shoes with accommodative shoe inserts with pocket accommodations to offload the plantar left first MPJ as well as additional padding fifth deepen the pocket Patient's husband present and treatment room today  Objective: Orientated 3  Plantar aspect left first MPJ has reactive callus without any surrounding erythema, edema, drainage with bleeding within the callus  Assessment: Pre-ulcerative plantar callus left first MPJ  Plan: Debride plantar callus left first MPJ without any bleeding  Reappoint 4 weeks

## 2015-10-23 ENCOUNTER — Other Ambulatory Visit: Payer: Self-pay | Admitting: Internal Medicine

## 2015-10-23 DIAGNOSIS — Z1231 Encounter for screening mammogram for malignant neoplasm of breast: Secondary | ICD-10-CM

## 2015-11-01 ENCOUNTER — Ambulatory Visit (INDEPENDENT_AMBULATORY_CARE_PROVIDER_SITE_OTHER): Payer: Medicare Other | Admitting: Pharmacist Clinician (PhC)/ Clinical Pharmacy Specialist

## 2015-11-01 DIAGNOSIS — I2699 Other pulmonary embolism without acute cor pulmonale: Secondary | ICD-10-CM

## 2015-11-01 DIAGNOSIS — I4891 Unspecified atrial fibrillation: Secondary | ICD-10-CM

## 2015-11-01 LAB — POCT INR: INR: 2

## 2015-11-06 DIAGNOSIS — N3020 Other chronic cystitis without hematuria: Secondary | ICD-10-CM | POA: Diagnosis not present

## 2015-11-10 ENCOUNTER — Telehealth: Payer: Self-pay | Admitting: *Deleted

## 2015-11-10 ENCOUNTER — Other Ambulatory Visit: Payer: Self-pay | Admitting: Cardiology

## 2015-11-10 MED ORDER — INDAPAMIDE 2.5 MG PO TABS: 2.5000 mg | ORAL_TABLET | ORAL | 3 refills | Status: DC

## 2015-11-10 NOTE — Telephone Encounter (Signed)
Spoke to patient 90 day refill for Lozol sent to pharmacy.

## 2015-11-15 ENCOUNTER — Ambulatory Visit (INDEPENDENT_AMBULATORY_CARE_PROVIDER_SITE_OTHER): Payer: Medicare Other | Admitting: Podiatry

## 2015-11-15 ENCOUNTER — Encounter: Payer: Self-pay | Admitting: Podiatry

## 2015-11-15 VITALS — BP 142/80 | HR 94 | Resp 18

## 2015-11-15 DIAGNOSIS — L84 Corns and callosities: Secondary | ICD-10-CM

## 2015-11-16 NOTE — Progress Notes (Signed)
Patient ID: [name], female   DOB: [birth year], 81 y.o.   MRN: [id number]    Subjective: This patient presents again for schedule visit for debridement of pre-ulcerative plantar callus on the plantar aspect left first MPJ. This area has episodes of superficial ulceration. Patient continues to wear athletic style shoes with accommodative shoe inserts with pocket accommodations to offload the plantar left first MPJ as well as additional padding fifth deepen the pocket Patient's husband present and treatment room today  Objective: Orientated 3  Plantar aspect left first MPJ has reactive callus without any surrounding erythema, edema, drainage with bleeding within the callus  Assessment: Pre-ulcerative plantar callus left first MPJ  Plan: Debride plantar callus left first MPJ without any bleeding Continue to wear custom insoles with additional felt padding to offload the plantar left first MPJ  Reappoint 4 week

## 2015-11-19 NOTE — Progress Notes (Signed)
Cardiology Office Note   Date:  11/20/2015   ID:  [name], DOB [birth year], MRN [id number]  PCP:  Horatio Pel, MD  Cardiologist: Seth Higginbotham [place] MD  Chief Complaint  Patient presents with  . Congestive Heart Failure  . Atrial Fibrillation      History of Present Illness: [name] is a 81 y.o. female who presents for follow up CHF and atrial fibrillation. She is a former patient of Dr. Mare Ferrari.   She was admitted to the hospital in February 2015 because of new onset atrial fibrillation. She was already on long-term Coumadin. Diltiazem for rate control was added to her regimen in the hospital. The patient has a past history of pulmonary emboli and is on long-term Coumadin. She has a history of essential hypertension and a history of right carotid artery occlusion. 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 Donnetta Hutching. She does not have any significant coronary disease and she had cardiac catheterization in 1994 showing only mild plaque and she had a nuclear stress test in December 2011 showing no ischemia and her ejection fraction was 78%. She is actively followed in the CHF clinic.  She is on Lozol daily and she takes low dose furosemide about every other day. She states lasix really makes her feel wiped out. Her weight is quite variable but responds readily to lasix. She complains of marked fatigue and no energy. Tried switching Bystolic to pm without improvement. Tapered off Celebrex but notes arthritic pain. Muscle are sore. Has tingling in arms and hands.    Past Medical History:  Diagnosis Date  . Acid reflux disease   . Atrial fibrillation ([place])   . Carotid artery occlusion   . Chronic diastolic CHF (congestive heart failure) (HCC)    Hypertensive heart disease 02-12-14  . Crohn disease ([place])   . Crohn's disease ([place])   . Detached retina   . Fibromyalgia   . Fibromyalgia   . GERD  (gastroesophageal reflux disease)   . Gout   . H/O hiatal hernia   . High triglycerides   . History of stomach ulcers 1950's  . Hypertension   . Long term current use of anticoagulant   . Obstructive sleep apnea on CPAP   . Osteoarthritis    PAIN AND OA LEF T HIP AND BOTH SHOULDERS ARE BONE ON BONE AND PAINFUL  . Peripheral vascular disease (HCC)    KNOWN RIGHT INTERNAL CAROTID ARTERY OCCLUSION (NO STROKE)  --40 TO 59% STENOSIS LEFT ICA-FOLLOWED BY DR. EARLY WITH DOPPLER STUDY EVERY 6 MONTHS  . Pulmonary embolism ([place]) 2011   a. Hx of PE in 02/2009 after R hip surgery, venous dopplers negative, long-term Coumadin.  [name] Kitchen PVC (premature ventricular contraction)    PT STATES HX OF PVC'S ON EKG  . Recurrent upper respiratory infection (URI)    BRONCHITIS FEB 2013--SLIGHT COUGH NON-PRODUCTIVE NOW  . Recurrent UTI (urinary tract infection)    "on daily medicine" (05/14/2012)  . Tinnitus   . Vertigo     Past Surgical History:  Procedure Laterality Date  . APPENDECTOMY  1950's  . BACK SURGERY  10/29/06   Central and foraminal decompression L3-L4, L4-L5, and L5-S1 with inspection of L4-L5 and L5-S1 disc on the right  . CARDIAC CATHETERIZATION  1970's   "maybe 2" (05/14/2012)  . CARDIAC CATHETERIZATION    . CATARACT EXTRACTION W/ INTRAOCULAR LENS IMPLANT Right  2009  . CHOLECYSTECTOMY  ~ 1988  . DECOMPRESSIVE LUMBAR LAMINECTOMY LEVEL 3  ~ 2002  . DILATION AND CURETTAGE OF UTERUS     "several; from miscarriages" (05/14/2012)  . EXCISION MORTON'S NEUROMA Right 05/20/00   "foot" (05/14/2012)  . EYE SURGERY Left 2014   Detached retina  . EYE SURGERY Left August 23, 2013   Cataract  . FEMUR FRACTURE SURGERY Right 02/21/09   Open reduction internal fixation of right periprosthetic  femur fracture utilizing Zimmer cables times fiv  . FRACTURE SURGERY  2011   Right Femur Fx  . HAMMER TOE SURGERY Left 10/25/08   "toe next to big to" (05/14/2012)  . JOINT REPLACEMENT  2009   Right Hip replacement  . JOINT  REPLACEMENT  2012   Right knee replacement  . JOINT REPLACEMENT  06/17/11   Left Hip replacement  . KNEE ARTHROSCOPY Right 07/26/05  . REPLACEMENT TOTAL KNEE Right 02/19/2010  . SPINE SURGERY    . [place]?  . TOTAL HIP ARTHROPLASTY Right 12/08/02   Osteonics total hip replacement  . TOTAL HIP ARTHROPLASTY  06/17/2011   Procedure: TOTAL HIP ARTHROPLASTY;  Surgeon: Gearlean Alf, MD;  Location: [facility];  Service: Orthopedics;  Laterality: Left;  . TOTAL SHOULDER ARTHROPLASTY Left 05/14/2012  . TOTAL SHOULDER ARTHROPLASTY Left 05/14/2012   Procedure: TOTAL SHOULDER ARTHROPLASTY;  Surgeon: Marin Shutter, MD;  Location: [facility];  Service: Orthopedics;  Laterality: Left;  [name] Kitchen VAGINAL HYSTERECTOMY  1971     Current Outpatient Prescriptions  Medication Sig Dispense Refill  . acetaminophen (TYLENOL) 500 MG tablet Take 500 mg by mouth daily as needed for headache.    . allopurinol (ZYLOPRIM) 300 MG tablet Take 300 mg by mouth Daily.     [name] Kitchen amLODipine (NORVASC) 5 MG tablet Take 1 tablet (5 mg total) by mouth daily. 30 tablet 6  . calcium citrate-vitamin D (CITRACAL+D) 315-200 MG-UNIT per tablet Take 1 tablet by mouth daily.    . cetirizine (ZYRTEC) 10 MG tablet Take 10 mg by mouth daily.    . clobetasol cream (TEMOVATE) 8.29 % Apply 1 application topically 2 (two) times daily.    . diphenhydrAMINE (BENADRYL) 25 mg capsule Take 25 mg by mouth at bedtime as needed for sleep. Reported on 03/15/2015    . dorzolamide-timolol (COSOPT) 22.3-6.8 MG/ML ophthalmic solution Place 1 drop into both eyes 2 (two) times daily.     . fenofibrate 160 MG tablet Take 160 mg by mouth every evening.     . furosemide (LASIX) 20 MG tablet Take 10 mg by mouth as directed. Mondays, Wednesdays, Fridays    . HYDROcodone-acetaminophen (NORCO) 10-325 MG per tablet Take 1 tablet by mouth every 6 (six) hours as needed (pain).     [name] Kitchen ketoconazole (NIZORAL) 2 % cream Apply 1 application topically 2 (two) times daily  as needed for irritation.     . Magnesium 400 MG CAPS Take 400 mg by mouth daily.     . Multiple Vitamins-Minerals (MULTIVITAMIN WITH MINERALS) tablet Take 1 tablet by mouth daily.    . nebivolol (BYSTOLIC) 10 MG tablet Take 1 tablet (10 mg total) by mouth at bedtime.    [name] Kitchen omeprazole (PRILOSEC) 40 MG capsule Take 40 mg by mouth daily with supper.     . potassium chloride (K-DUR,KLOR-CON) 10 MEQ tablet Take 10 mEq by mouth 2 (two) times daily.    . vitamin C (ASCORBIC ACID) 500 MG tablet Take 500 mg by mouth  daily.    . warfarin (COUMADIN) 2.5 MG tablet Take 1-1.5 tablets by mouth daily as directed by coumadin clinic 40 tablet 3   No current facility-administered medications for this visit.     Allergies:   Codeine; Cymbalta [duloxetine hcl]; Diltiazem cd [diltiazem hcl er beads]; Metoprolol; Nortriptyline; Penicillins; Statins; Tetanus toxoid adsorbed; Tetanus toxoids; Ciprofloxacin; Lyrica [pregabalin]; and Penicillin g    Social History:  The patient  reports that she quit smoking about 29 years ago. Her smoking use included Cigarettes. She has a 7.50 pack-year smoking history. She has never used smokeless tobacco. She reports that she drinks alcohol. She reports that she does not use drugs.   Family History:  The patient's family history includes Aneurysm in her mother; Diabetes in her paternal grandfather and son; Heart attack in her brother; Heart attack (age of onset: 45) in her father; Heart disease in her brother, father, and mother; Hyperlipidemia in her brother; Hypertension in her brother, brother, father, and mother; Peripheral vascular disease in her father; Stroke in her mother.    ROS:  Please see the history of present illness.   Otherwise, review of systems are positive for none.   All other systems are reviewed and negative.    PHYSICAL EXAM: VS:  BP 110/70   Pulse 88   Ht 5' 2"  (1.575 m)   Wt 222 lb 9.6 oz (101 kg)   BMI 40.71 kg/m  , BMI Body mass index is 40.71  kg/m. GEN: Well nourished, morbidly obese, in no acute distress  HEENT: normal  Neck: no JVD, carotid bruits, or masses Cardiac: Irregularly irregular; no murmurs, rubs, or gallops,no edema  Respiratory:  clear to auscultation bilaterally, normal work of breathing GI: soft, nontender, nondistended, + BS MS: no deformity or atrophy  Skin: warm and dry, no rash Neuro:  Strength and sensation are intact Psych: euthymic mood, full affect   EKG:  EKG is not ordered today.    Recent Labs: Labs dated 05/01/15: cholesterol 153, triglycerides 384, HDL 34, LDL 42. Glucose 150. CMET and CBC otherwise normal.     Wt Readings from Last 3 Encounters:  11/20/15 222 lb 9.6 oz (101 kg)  08/11/15 232 lb 6.4 oz (105.4 kg)  05/15/15 227 lb (103 kg)        ASSESSMENT AND PLAN:  1. Permanent atrial fibrillation on long-term anticoagulation. Doing well on rate control and long-term anticoagulation.Continue current therapy 2. History of pulmonary emboli, on warfarin 3. Essential hypertension with intolerance to metoprolol. Currently well controlled. We discussed trying to minimize medication side effects so we will discontinue lozol. Continue Bystolic, amlodipine, and prn lasix.  4. Chronic diastolic CHF. She does not tolerate Lasix on a maintenance schedule. Will continue Lasix 3 x/wk prn. Sodium restriction. Weight is actually down 10 lbs today. 5. Morbid obesity. Encourage weight loss with carb restriction.    Current medicines are reviewed at length with the patient today.  The patient does not have concerns regarding medicines.  The following changes have been made:  no change  Labs/ tests ordered today include:  No orders of the defined types were placed in this encounter.    Disposition: as noted above.   Follow up in 6 months.  Signed, Eldora Napp [place]ique MD, [facility]    11/20/2015 3:23 PM     Phone: [phone]; Fax: [phone]

## 2015-11-20 ENCOUNTER — Encounter: Payer: Self-pay | Admitting: Cardiology

## 2015-11-20 ENCOUNTER — Ambulatory Visit (INDEPENDENT_AMBULATORY_CARE_PROVIDER_SITE_OTHER): Payer: Medicare Other | Admitting: Cardiology

## 2015-11-20 VITALS — BP 110/70 | HR 88 | Ht 62.0 in | Wt 222.6 lb

## 2015-11-20 DIAGNOSIS — I10 Essential (primary) hypertension: Secondary | ICD-10-CM | POA: Diagnosis not present

## 2015-11-20 DIAGNOSIS — I4891 Unspecified atrial fibrillation: Secondary | ICD-10-CM

## 2015-11-20 DIAGNOSIS — I482 Chronic atrial fibrillation, unspecified: Secondary | ICD-10-CM

## 2015-11-20 DIAGNOSIS — I6521 Occlusion and stenosis of right carotid artery: Secondary | ICD-10-CM

## 2015-11-20 DIAGNOSIS — I5032 Chronic diastolic (congestive) heart failure: Secondary | ICD-10-CM | POA: Diagnosis not present

## 2015-11-20 NOTE — Patient Instructions (Addendum)
Stop taking Lozol. Continue to monitor your blood pressure.  Continue your other therapy  I will see you in 6 months.

## 2015-11-21 DIAGNOSIS — M797 Fibromyalgia: Secondary | ICD-10-CM | POA: Diagnosis not present

## 2015-11-21 DIAGNOSIS — E785 Hyperlipidemia, unspecified: Secondary | ICD-10-CM | POA: Diagnosis not present

## 2015-11-28 ENCOUNTER — Ambulatory Visit: Payer: Medicare Other

## 2015-11-29 ENCOUNTER — Ambulatory Visit (INDEPENDENT_AMBULATORY_CARE_PROVIDER_SITE_OTHER): Payer: Medicare Other | Admitting: Pharmacist Clinician (PhC)/ Clinical Pharmacy Specialist

## 2015-11-29 DIAGNOSIS — I4891 Unspecified atrial fibrillation: Secondary | ICD-10-CM | POA: Diagnosis not present

## 2015-11-29 DIAGNOSIS — I2699 Other pulmonary embolism without acute cor pulmonale: Secondary | ICD-10-CM | POA: Diagnosis not present

## 2015-11-29 LAB — POCT INR: INR: 1.9

## 2015-12-12 ENCOUNTER — Ambulatory Visit (INDEPENDENT_AMBULATORY_CARE_PROVIDER_SITE_OTHER): Payer: Medicare Other | Admitting: Podiatry

## 2015-12-12 ENCOUNTER — Encounter: Payer: Self-pay | Admitting: Podiatry

## 2015-12-12 VITALS — BP 135/74 | HR 94 | Resp 14

## 2015-12-12 DIAGNOSIS — L98491 Non-pressure chronic ulcer of skin of other sites limited to breakdown of skin: Secondary | ICD-10-CM | POA: Diagnosis not present

## 2015-12-12 MED ORDER — SILVER SULFADIAZINE 1 % EX CREA: 1.0000 "application " | TOPICAL_CREAM | Freq: Every day | CUTANEOUS | 0 refills | Status: DC

## 2015-12-12 NOTE — Patient Instructions (Signed)
Removed bandage on left foot in 48 hours. Apply Silvadene cream and gauze dressing and a pad around the skin ulcer on a daily basis. Wear the surgical shoe on the left foot. If you notice any sudden increase in pain, swelling, redness, femur, warmth present to the emergency department

## 2015-12-12 NOTE — Progress Notes (Signed)
Patient ID: [name], female   DOB: 07/07/1934, 81 y.o.   MRN: [id number]    Subjective: This patient presents again for schedule visit for debridement of pre-ulcerative plantar callus on the plantar aspect left first MPJ. This area has episodes of superficial ulceration. Patient continues to wear athletic style shoes with accommodative shoe inserts with pocket accommodations to offload the plantar left first MPJ as well as additional padding fifth deepen the pocket. She has noticed some bloody drainage from the pre-ulcerative callus on the left foot since the visit of 01/15/2016 and is applying Vaseline and a protective pad to the area Patient's husband present and treatment room today  Objective: Orientated 3 The plantar first MPJ has hemorrhagic callus with evidence of some bleeding noticed on the bandage. After debridement of this area the first MPJ and the left foot breaks down into a superficial ulcer with a macerated, granular base with active bleeding. The wound is 10 millimeters in diameter. There is no surrounding erythema, edema, warmth or malodor    Assessment: Superficial noninfected skin ulcer plantar aspect of the left first MPJ  Plan: Debride the plantar ulcer and apply Silvadene compression dressing with a protective foam pad around the ulcer. Instructed patient to leave bandages on up to 48 hours to allow coagulation from leading associated with Coumadin therapy. Continue to apply Silvadene cream daily after removal the bandage with a protective gauze dressing Wear surgical shoe on the left foot Patient instructed if she knows any sudden increase in pain, swelling, redness, warmth present to the emergency department Dispensed Darco surgical shoe to wear and left foot with instructions to place the existing diabetic insole inside the Darco shoe and wear if the insole does not slide within the shoe  Reappoint 7 days

## 2015-12-18 ENCOUNTER — Ambulatory Visit (INDEPENDENT_AMBULATORY_CARE_PROVIDER_SITE_OTHER): Payer: Medicare Other | Admitting: Pharmacist Clinician (PhC)/ Clinical Pharmacy Specialist

## 2015-12-18 DIAGNOSIS — I4891 Unspecified atrial fibrillation: Secondary | ICD-10-CM

## 2015-12-18 LAB — POCT INR: INR: 2.1

## 2015-12-20 ENCOUNTER — Ambulatory Visit (INDEPENDENT_AMBULATORY_CARE_PROVIDER_SITE_OTHER): Payer: Medicare Other | Admitting: Podiatry

## 2015-12-20 ENCOUNTER — Encounter: Payer: Self-pay | Admitting: Podiatry

## 2015-12-20 VITALS — BP 130/66 | HR 97 | Resp 18

## 2015-12-20 DIAGNOSIS — L84 Corns and callosities: Secondary | ICD-10-CM

## 2015-12-20 DIAGNOSIS — G609 Hereditary and idiopathic neuropathy, unspecified: Secondary | ICD-10-CM

## 2015-12-20 NOTE — Patient Instructions (Signed)
Discontinue surgical shoe left Discontinue Silvadene cream Apply antibiotic ointment and Band-Aid to pre-ulcerative callus on the ball of left foot Resume wearing athletic shoe with custom insole

## 2015-12-20 NOTE — Progress Notes (Signed)
Patient ID: [name], female   DOB: [birth year], 81 y.o.   MRN: [id number]   Subjective: Patient presents for ongoing care for pre-ulcerative in ulcerative plantar lesion on the plantar first MPJ. On the visit of 12/12/2015 the plantar first MPJ developed a superficial ulcer. Patient currently wearing surgical shoe and apply Silvadene cream to the area and attaching protective pad around these ulcer site Patient's husband present in the treatment room today  Objective: Orientated 3 DP and PT pulses 2/4 bilaterally Capillary reflex immediate bilaterally Sensation to 10 g monofilament wire intact 0/5 bilaterally Vibratory sensation nonreactive bilaterally Ankle reflex equal and reactive bilaterally Hemorrhagic callus plantar left first MPJ that remains closed after debridement Manual motor testing dorsi flexion,.plantar flexion, inversion, eversion 5/5 bilaterally  Assessment: Idiopathic peripheral neuropathy Pre-ulcerative plantar callus left first MPJ  Plan: Debride plantar pre-ulcerative callus DC Silvadene cream and surgical shoe Patient will resume wearing athletic shoe with custom insole with additional felt pad to offload the plantar first MPJ Patient will apply topical Vaseline and Band-Aid to the pre-ulcerative plantar callus left  Reappoint 4 weeks

## 2016-01-08 ENCOUNTER — Other Ambulatory Visit: Payer: Self-pay | Admitting: *Deleted

## 2016-01-08 DIAGNOSIS — Z780 Asymptomatic menopausal state: Secondary | ICD-10-CM | POA: Diagnosis not present

## 2016-01-08 DIAGNOSIS — I10 Essential (primary) hypertension: Secondary | ICD-10-CM | POA: Diagnosis not present

## 2016-01-08 DIAGNOSIS — M810 Age-related osteoporosis without current pathological fracture: Secondary | ICD-10-CM | POA: Diagnosis not present

## 2016-01-08 DIAGNOSIS — M545 Low back pain: Secondary | ICD-10-CM | POA: Diagnosis not present

## 2016-01-08 MED ORDER — NEBIVOLOL HCL 10 MG PO TABS: 10.0000 mg | ORAL_TABLET | Freq: Every day | ORAL | 3 refills | Status: DC

## 2016-01-15 ENCOUNTER — Ambulatory Visit (INDEPENDENT_AMBULATORY_CARE_PROVIDER_SITE_OTHER): Payer: Medicare Other | Admitting: Pharmacist Clinician (PhC)/ Clinical Pharmacy Specialist

## 2016-01-15 DIAGNOSIS — I4891 Unspecified atrial fibrillation: Secondary | ICD-10-CM | POA: Diagnosis not present

## 2016-01-15 DIAGNOSIS — I6521 Occlusion and stenosis of right carotid artery: Secondary | ICD-10-CM | POA: Diagnosis not present

## 2016-01-15 LAB — POCT INR: INR: 2.3

## 2016-01-17 ENCOUNTER — Encounter: Payer: Self-pay | Admitting: Podiatry

## 2016-01-17 ENCOUNTER — Ambulatory Visit (INDEPENDENT_AMBULATORY_CARE_PROVIDER_SITE_OTHER): Payer: Medicare Other | Admitting: Podiatry

## 2016-01-17 VITALS — BP 158/92 | HR 101 | Resp 18

## 2016-01-17 DIAGNOSIS — G609 Hereditary and idiopathic neuropathy, unspecified: Secondary | ICD-10-CM | POA: Diagnosis not present

## 2016-01-17 DIAGNOSIS — L84 Corns and callosities: Secondary | ICD-10-CM

## 2016-01-17 NOTE — Progress Notes (Signed)
Patient ID: [name], female   DOB: 1934-06-12, 81 y.o.   MRN: [id number]    Subjective: Patient presents for ongoing care for pre-ulcerative in ulcerative plantar lesion on the plantar first MPJ. On the visit of 12/12/2015 the plantar first MPJ developed a superficial ulcer. The ulcer resolved on the visit of 12/20/2015  Objective: Orientated 3 DP and PT pulses 2/4 bilaterally Capillary reflex immediate bilaterally Sensation to 10 g monofilament wire intact 0/5 bilaterally Vibratory sensation nonreactive bilaterally Ankle reflex equal and reactive bilaterally Hemorrhagic callus plantar left first MPJ that remains closed after debridement Manual motor testing dorsi flexion,.plantar flexion, inversion, eversion 5/5 bilaterally  Assessment: Idiopathic peripheral neuropathy Pre-ulcerative plantar callus left first MPJ  Plan: Debride plantar pre-ulcerative callus Patient will resume wearing athletic shoe with custom insole with additional felt pad to offload the plantar first MPJ Patient will apply topical Vaseline and Band-Aid to the pre-ulcerative plantar callus left  Reappoint 4 weeks

## 2016-01-23 ENCOUNTER — Ambulatory Visit: Payer: Medicare Other

## 2016-01-23 DIAGNOSIS — M25572 Pain in left ankle and joints of left foot: Secondary | ICD-10-CM | POA: Diagnosis not present

## 2016-01-23 DIAGNOSIS — G8929 Other chronic pain: Secondary | ICD-10-CM | POA: Diagnosis not present

## 2016-02-13 ENCOUNTER — Ambulatory Visit (INDEPENDENT_AMBULATORY_CARE_PROVIDER_SITE_OTHER): Payer: Medicare Other | Admitting: Podiatry

## 2016-02-13 ENCOUNTER — Encounter: Payer: Self-pay | Admitting: Podiatry

## 2016-02-13 VITALS — BP 147/84 | HR 99 | Resp 16

## 2016-02-13 DIAGNOSIS — G609 Hereditary and idiopathic neuropathy, unspecified: Secondary | ICD-10-CM | POA: Diagnosis not present

## 2016-02-13 DIAGNOSIS — L84 Corns and callosities: Secondary | ICD-10-CM | POA: Diagnosis not present

## 2016-02-13 NOTE — Progress Notes (Signed)
Patient ID: [name], female   DOB: 06-Jan-1935, 82 y.o.   MRN: [id number]    Subjective: Patient presents for ongoing care for pre-ulcerative in ulcerative plantar lesion on the plantar first MPJ. The patient's husband is present in the treatment room today    Objective: Orientated 3 DP and PT pulses 2/4 bilaterally Capillary reflex immediate bilaterally Sensation to 10 g monofilament wire intact 0/5 bilaterally Vibratory sensation nonreactive bilaterally Ankle reflex equal and reactive bilaterally Hemorrhagic callus plantar left first MPJ that remains closed after debridement Manual motor testing dorsi flexion,.plantar flexion, inversion, eversion 5/5 bilaterally  Assessment: Idiopathic peripheral neuropathy Pre-ulcerative plantar callus left first MPJ  Plan: Debride plantar pre-ulcerative callus Patient will resume wearing athletic shoe with custom insole with additional felt pad to offload the plantar first MPJ Patient will apply topical Vaseline and Band-Aid to the pre-ulcerative plantar callus left  Reappoint 5 weeks

## 2016-02-13 NOTE — Patient Instructions (Signed)
Apply Vaseline and a Band-Aid to the pre-ulcerative callus daily on left foot

## 2016-02-14 ENCOUNTER — Other Ambulatory Visit: Payer: Self-pay

## 2016-02-14 MED ORDER — FUROSEMIDE 20 MG PO TABS: 10.0000 mg | ORAL_TABLET | ORAL | 6 refills | Status: DC

## 2016-02-19 ENCOUNTER — Ambulatory Visit (INDEPENDENT_AMBULATORY_CARE_PROVIDER_SITE_OTHER): Payer: Medicare Other | Admitting: Pharmacist Clinician (PhC)/ Clinical Pharmacy Specialist

## 2016-02-19 DIAGNOSIS — I4891 Unspecified atrial fibrillation: Secondary | ICD-10-CM | POA: Diagnosis not present

## 2016-02-19 LAB — POCT INR: INR: 2

## 2016-02-21 DIAGNOSIS — M545 Low back pain: Secondary | ICD-10-CM | POA: Diagnosis not present

## 2016-02-21 DIAGNOSIS — R358 Other polyuria: Secondary | ICD-10-CM | POA: Diagnosis not present

## 2016-02-27 ENCOUNTER — Ambulatory Visit: Payer: Medicare Other

## 2016-03-12 ENCOUNTER — Other Ambulatory Visit: Payer: Self-pay | Admitting: Cardiology

## 2016-03-20 ENCOUNTER — Ambulatory Visit (INDEPENDENT_AMBULATORY_CARE_PROVIDER_SITE_OTHER): Payer: Medicare Other | Admitting: Podiatry

## 2016-03-20 DIAGNOSIS — L84 Corns and callosities: Secondary | ICD-10-CM | POA: Diagnosis not present

## 2016-03-20 DIAGNOSIS — G609 Hereditary and idiopathic neuropathy, unspecified: Secondary | ICD-10-CM | POA: Diagnosis not present

## 2016-03-20 NOTE — Progress Notes (Signed)
Patient ID: [name], female   DOB: March 12, 1934, 82 y.o.   MRN: [id number]    Subjective: Patient presents for ongoing care for pre-ulcerative in ulcerative plantar lesion on the plantar first MPJ. The patient's husband is present in the treatment room today    Objective: Orientated 3 DP and PT pulses 2/4 bilaterally Capillary reflex immediate bilaterally Sensation to 10 g monofilament wire intact 0/5 bilaterally Vibratory sensation nonreactive bilaterally Ankle reflex equal and reactive bilaterally Hemorrhagic callus plantar left first MPJ that remains closed after debridement Manual motor testing dorsi flexion,.plantar flexion, inversion, eversion 5/5 bilaterally  Assessment: Idiopathic peripheral neuropathy Pre-ulcerative plantar callus left first MPJ  Plan: Debride plantar pre-ulcerative callus Patient will resume wearing athletic shoe with custom insole with additional felt pad to offload the plantar first MPJ Patient will apply topical Vaseline and Band-Aid to the pre-ulcerative plantar callus left  Reappoint 4 weeks

## 2016-04-01 ENCOUNTER — Ambulatory Visit (INDEPENDENT_AMBULATORY_CARE_PROVIDER_SITE_OTHER): Payer: Medicare Other | Admitting: Pharmacist Clinician (PhC)/ Clinical Pharmacy Specialist

## 2016-04-01 DIAGNOSIS — I4891 Unspecified atrial fibrillation: Secondary | ICD-10-CM | POA: Diagnosis not present

## 2016-04-01 LAB — POCT INR: INR: 2.8

## 2016-04-12 DIAGNOSIS — Z5181 Encounter for therapeutic drug level monitoring: Secondary | ICD-10-CM | POA: Diagnosis not present

## 2016-04-17 ENCOUNTER — Ambulatory Visit (INDEPENDENT_AMBULATORY_CARE_PROVIDER_SITE_OTHER): Payer: Medicare Other | Admitting: Podiatry

## 2016-04-17 ENCOUNTER — Encounter: Payer: Self-pay | Admitting: Podiatry

## 2016-04-17 VITALS — BP 145/75 | HR 102 | Resp 18

## 2016-04-17 DIAGNOSIS — G609 Hereditary and idiopathic neuropathy, unspecified: Secondary | ICD-10-CM

## 2016-04-17 DIAGNOSIS — Q828 Other specified congenital malformations of skin: Secondary | ICD-10-CM

## 2016-04-17 DIAGNOSIS — L84 Corns and callosities: Secondary | ICD-10-CM | POA: Diagnosis not present

## 2016-04-17 NOTE — Progress Notes (Signed)
Patient ID: RENNIE HACK, female   DOB: [birth year], 82 y.o.   MRN: [id number]    Subjective: Patient presents for ongoing care for pre-ulcerative in ulcerative plantar lesion on the plantar first MPJ.The patient's husband is present in the treatment room today  Objective: Orientated 3 DP and PT pulses 2/4 bilaterally Capillary reflex immediate bilaterally Sensation to 10 g monofilament wire intact 0/5 bilaterally Vibratory sensation nonreactive bilaterally Ankle reflex equal and reactive bilaterally Hemorrhagic callus plantar left first MPJ that remains closed after debridement Manual motor testing dorsi flexion,.plantar flexion, inversion, eversion 5/5 bilaterally  Assessment: Idiopathic peripheral neuropathy Pre-ulcerative plantar callus left first MPJ  Plan: Debride plantar pre-ulcerative callus Patient will resume wearing athletic shoe with custom insole with additional felt pad to offload the plantar first MPJ Patient will apply topical Vaseline and Band-Aid to the pre-ulcerative plantar callus left  Reappoint 4 weeks

## 2016-04-17 NOTE — Patient Instructions (Signed)
Apply Vaseline to the pre-ulcerative callus on the right foot daily Continue to wear athletic shoe with modified custom insole

## 2016-04-19 ENCOUNTER — Encounter: Payer: Self-pay | Admitting: Family

## 2016-04-30 ENCOUNTER — Ambulatory Visit (HOSPITAL_COMMUNITY)
Admission: RE | Admit: 2016-04-30 | Discharge: 2016-04-30 | Disposition: A | Discharge status: Home / Self Care | Payer: Medicare Other | Source: Ambulatory Visit | Attending: Family | Admitting: Family

## 2016-04-30 ENCOUNTER — Ambulatory Visit (INDEPENDENT_AMBULATORY_CARE_PROVIDER_SITE_OTHER): Payer: Medicare Other | Admitting: Family

## 2016-04-30 ENCOUNTER — Encounter: Payer: Self-pay | Admitting: Family

## 2016-04-30 VITALS — BP 159/89 | HR 94 | Temp 97.1°F | Resp 18 | Ht 62.0 in | Wt 229.5 lb

## 2016-04-30 DIAGNOSIS — I6521 Occlusion and stenosis of right carotid artery: Secondary | ICD-10-CM | POA: Diagnosis not present

## 2016-04-30 DIAGNOSIS — I6522 Occlusion and stenosis of left carotid artery: Secondary | ICD-10-CM

## 2016-04-30 DIAGNOSIS — I6523 Occlusion and stenosis of bilateral carotid arteries: Secondary | ICD-10-CM | POA: Diagnosis not present

## 2016-04-30 LAB — VAS US CAROTID
LCCAPDIAS: 16 cm/s
LCCAPSYS: 55 cm/s
LEFT ECA DIAS: -26 cm/s
LICADDIAS: -21 cm/s
Left CCA dist dias: -18 cm/s
Left CCA dist sys: -65 cm/s
Left ICA dist sys: -65 cm/s
Left ICA prox dias: 55 cm/s
Left ICA prox sys: 310 cm/s
RCCAPSYS: 36 cm/s
RIGHT ECA DIAS: -56 cm/s

## 2016-04-30 NOTE — Progress Notes (Signed)
Chief Complaint: Follow up Extracranial Carotid Artery Stenosis   History of Present Illness  [name] is a 82 y.o. female patient of Dr. Donnetta Hutching seen for left-sided carotid stenosis with a known right ICA occlusion.  She returns today for follow up.  Patient has not had previous carotid artery intervention.  She denies any remote or recent stroke of TIA symptoms.  Denies steal symptoms.  Does not have claudication symptoms.  Tires easily since she had the PE.  Has mild neuropathy in both feet for years, attributes to back issues.  She had detatched retina repaired Dec. 2, 2014, had cataract surgery July, 2015, can see much better, no more headaches. She has Crohn's Disease and was evaluted for occult GI bleed, is anemic, states her Hg is increasing with oral iron. April, 2014 she had a left total shoulder replaced, then developed PE's, she now takes coumadin.  Her right shoulder has issues.  She has had lumbar spine surgery.  She has severe generalized OA, has affected her feet, has trouble walking. She was receiving physical therapy, is doing some walking. Is also getting some treatments to her feet (OA pain) that helps with pain and is able to walk more.   She also has fibromyalgia which is worse lately. She states her blood pressure usually runs 124-132/ 68-72. She states she has proven white coat syndrome.  She had an exacerbation of her CHF January 2016, observed in ED, did not need to be admitted.  She had her left foot injected 04/24/15 with a nerve block for pain relief. She developed a severe URI early in 2017 with a relapse, had PT to help with weakness. She saw Dr. Missy Sabins in February 2017 for borderline CHF.   Pt Diabetic: No  Pt smoker: former smoker, quit over 25 years ago   Pt meds include:  Statin : No: cannot tolerate  Betablocker: Yes  ASA: No  Other anticoagulants/antiplatelets: coumadin for atrial fib and hx of PE post shoulder  surgery     Past Medical History:  Diagnosis Date  . Acid reflux disease   . Atrial fibrillation ([place])   . Carotid artery occlusion   . Chronic diastolic CHF (congestive heart failure) (HCC)    Hypertensive heart disease 02-12-14  . Crohn disease ([place])   . Crohn's disease ([place])   . Detached retina   . Fibromyalgia   . Fibromyalgia   . GERD (gastroesophageal reflux disease)   . Gout   . H/O hiatal hernia   . High triglycerides   . History of stomach ulcers 1950's  . Hypertension   . Long term current use of anticoagulant   . Obstructive sleep apnea on CPAP   . Osteoarthritis    PAIN AND OA LEF T HIP AND BOTH SHOULDERS ARE BONE ON BONE AND PAINFUL  . Peripheral vascular disease (HCC)    KNOWN RIGHT INTERNAL CAROTID ARTERY OCCLUSION (NO STROKE)  --40 TO 59% STENOSIS LEFT ICA-FOLLOWED BY DR. EARLY WITH DOPPLER STUDY EVERY 6 MONTHS  . Pulmonary embolism ([place]) 2011   a. Hx of PE in 02/2009 after R hip surgery, venous dopplers negative, long-term Coumadin.  [name] Kitchen PVC (premature ventricular contraction)    PT STATES HX OF PVC'S ON EKG  . Recurrent upper respiratory infection (URI)    BRONCHITIS FEB 2013--SLIGHT COUGH NON-PRODUCTIVE NOW  . Recurrent UTI (urinary tract infection)    "on daily medicine" (05/14/2012)  . Tinnitus   . Vertigo     Social History  Social History  Substance Use Topics  . Smoking status: Former Smoker    Packs/day: 0.50    Years: 15.00    Types: Cigarettes    Quit date: 02/11/1986  . Smokeless tobacco: Never Used  . Alcohol use 0.0 oz/week     Comment: 05/14/2012 "have 2-3 drinks/yr" maybe    Family History Family History  Problem Relation Age of Onset  . Heart disease Father     Heart Disease before age 59  . Heart attack Father 74  . Hypertension Father   . Peripheral vascular disease Father     Right  leg amputation  . Heart disease Mother     AAA  . Stroke Mother   . Aneurysm Mother   . Hypertension Mother   . AAA (abdominal aortic aneurysm)  Mother   . Diabetes Paternal Grandfather   . Hypertension Brother   . Heart disease Brother     Heart Disease before age 28  . Hyperlipidemia Brother   . Heart attack Brother   . Hypertension Brother   . Diabetes Son     Surgical History Past Surgical History:  Procedure Laterality Date  . APPENDECTOMY  1950's  . BACK SURGERY  10/29/06   Central and foraminal decompression L3-L4, L4-L5, and L5-S1 with inspection of L4-L5 and L5-S1 disc on the right  . CARDIAC CATHETERIZATION  1970's   "maybe 2" (05/14/2012)  . CARDIAC CATHETERIZATION    . CATARACT EXTRACTION W/ INTRAOCULAR LENS IMPLANT Right 2009  . CHOLECYSTECTOMY  ~ 1988  . DECOMPRESSIVE LUMBAR LAMINECTOMY LEVEL 3  ~ 2002  . DILATION AND CURETTAGE OF UTERUS     "several; from miscarriages" (05/14/2012)  . EXCISION MORTON'S NEUROMA Right 05/20/00   "foot" (05/14/2012)  . EYE SURGERY Left 2014   Detached retina  . EYE SURGERY Left August 23, 2013   Cataract  . FEMUR FRACTURE SURGERY Right 02/21/09   Open reduction internal fixation of right periprosthetic  femur fracture utilizing Zimmer cables times fiv  . FRACTURE SURGERY  2011   Right Femur Fx  . HAMMER TOE SURGERY Left 10/25/08   "toe next to big to" (05/14/2012)  . JOINT REPLACEMENT  2009   Right Hip replacement  . JOINT REPLACEMENT  2012   Right knee replacement  . JOINT REPLACEMENT  06/17/11   Left Hip replacement  . KNEE ARTHROSCOPY Right 07/26/05  . REPLACEMENT TOTAL KNEE Right 02/19/2010  . SPINE SURGERY    . [place]?  . TOTAL HIP ARTHROPLASTY Right 12/08/02   Osteonics total hip replacement  . TOTAL HIP ARTHROPLASTY  06/17/2011   Procedure: TOTAL HIP ARTHROPLASTY;  Surgeon: Gearlean Alf, MD;  Location: [facility];  Service: Orthopedics;  Laterality: Left;  . TOTAL SHOULDER ARTHROPLASTY Left 05/14/2012  . TOTAL SHOULDER ARTHROPLASTY Left 05/14/2012   Procedure: TOTAL SHOULDER ARTHROPLASTY;  Surgeon: Marin Shutter, MD;  Location: [facility];  Service:  Orthopedics;  Laterality: Left;  [name] Kitchen VAGINAL HYSTERECTOMY  1971    Allergies  Allergen Reactions  . Codeine     NAUSEA  . Cymbalta [Duloxetine Hcl]     Nausea VOMITING AND ABDOMINAL PAIN, HEADACHE, JUST ABOUT EVERY SIDE EFFECT THE DRUG HAS  . Diltiazem Cd [Diltiazem Hcl Er Beads]     Heavy legs, cough  . Metoprolol     Legs like cement  . Nortriptyline     Nightmares  . Penicillins     RASH  & ITCHING   --PT STATES  SHE CAN TAKE KEFLEX PO AND IV CEPHALOSPORINS  . Statins     Leg cramps  . Tetanus Toxoid Adsorbed Swelling  . Tetanus Toxoids     SWELLING, REDNESS  WHOLE ARM  . Ciprofloxacin Rash    RASH  . Lyrica [Pregabalin] Diarrhea, Nausea And Vomiting and Rash  . Penicillin G Rash    Current Outpatient Prescriptions  Medication Sig Dispense Refill  . Biotin 2.5 MG CAPS Take 1 capsule by mouth daily.    . cetirizine (ZYRTEC) 10 MG tablet Take 10 mg by mouth daily.    . clobetasol cream (TEMOVATE) 9.02 % Apply 1 application topically 2 (two) times daily.    [name] Kitchen CRANBERRY CONCENTRATE PO Take 1 tablet by mouth daily.    . dorzolamide-timolol (COSOPT) 22.3-6.8 MG/ML ophthalmic solution Place 1 drop into both eyes 2 (two) times daily.     . furosemide (LASIX) 20 MG tablet Take 0.5 tablets (10 mg total) by mouth as directed. Mondays, Wednesdays, Fridays 30 tablet 6  . HYDROcodone-acetaminophen (NORCO) 10-325 MG per tablet Take 1 tablet by mouth every 6 (six) hours as needed (pain).     . indapamide (LOZOL) 2.5 MG tablet Take 2.5 mg by mouth daily.    [name] Kitchen ketoconazole (NIZORAL) 2 % cream Apply 1 application topically 2 (two) times daily as needed for irritation.     . nebivolol (BYSTOLIC) 10 MG tablet Take 1 tablet (10 mg total) by mouth at bedtime. 90 tablet 3  . omeprazole (PRILOSEC) 40 MG capsule Take 40 mg by mouth daily.    . potassium chloride (K-DUR,KLOR-CON) 10 MEQ tablet Take 10 mEq by mouth 2 (two) times daily.    . Probiotic Product (ALIGN PO) Take 1 capsule by mouth daily as  needed.    . silver sulfADIAZINE (SILVADENE) 1 % cream Apply 1 application topically daily. 50 g 0  . warfarin (COUMADIN) 2.5 MG tablet TAKE 1-1&1/2 TABLETS DAILY AS DIRECTED BY COUMADIN CLINIC. 40 tablet 2   No current facility-administered medications for this visit.     Review of Systems : See HPI for pertinent positives and negatives.  Physical Examination  Vitals:   04/30/16 1345 04/30/16 1348  BP: 126/67 (!) 159/89  Pulse: 94   Resp: 18   Temp: 97.1 F (36.2 C)   TempSrc: Oral   SpO2: 91%   Weight: 229 lb 8 oz (104.1 kg)   Height: 5' 2"  (1.575 m)    Body mass index is 41.98 kg/m.  General: WDWN morbidly obese female in NAD  GAIT: slow, deliberate, using cane Eyes: PERRLA  Pulmonary: CTAB, respirations are non labored Cardiac: Irregular rhythm, no detected murmur  VASCULAR EXAM  Carotid Bruits  Left  Right    Positive  Negative    Radial pulses are 2+ palpable and equal.   LE Pulses  LEFT  RIGHT   POPLITEAL  not palpable  not palpable   DP palpable palpable   Bilateral DP pulses are palpable. Graduated compression hose are in place.   Gastrointestinal: soft, nontender, BS WNL, no r/g, no palpable masses.  Musculoskeletal: No muscle atrophy/wasting. M/S 4/5 throughout, Extremities without ischemic changes.  Neurologic: A&O X 3; Appropriate Affect, Speech is normal  CN 2-12 intact, Pain and light touch intact in extremities, Motor exam as listed above    Assessment: CYANNA NEACE is a 82 y.o. female who is seen for left internal carotid artery stenosis with a known right ICA occlusion. She has no history of stroke or  TIA. She takes coumadin for hx of PE and atrial fib. She is statin intolerant, is morbidly obese.   DATA Today's carotid Duplex suggests confirmed occlusion of the right internal carotid artery and 40 - 59 % left ICA stenosis. Significant stenosis of the bilateral ECA. Bilateral vertebral  artery flow is antegrade.  Bilateral subclavian artery waveforms are normal.  No significant change compared to the prior exam on 04-25-15.    Plan: Follow-up in 1 year with Carotid Duplex scan.    I discussed in depth with the patient the nature of atherosclerosis, and emphasized the importance of maximal medical management including strict control of blood pressure, blood glucose, and lipid levels, obtaining regular exercise, and continued cessation of smoking.  The patient is aware that without maximal medical management the underlying atherosclerotic disease process will progress, limiting the benefit of any interventions. The patient was given information about stroke prevention and what symptoms should prompt the patient to seek immediate medical care. Thank you for allowing [place] to participate in this patient's care.  Clemon Chambers, RN, MSN, FNP-C [facility] Office: [phone]  Clinic Physician: Early  04/30/16 1:54 PM

## 2016-04-30 NOTE — Patient Instructions (Signed)
Stroke Prevention Some medical conditions and behaviors are associated with an increased chance of having a stroke. You may prevent a stroke by making healthy choices and managing medical conditions. How can I reduce my risk of having a stroke?  Stay physically active. Get at least 30 minutes of activity on most or all days.  Do not smoke. It may also be helpful to avoid exposure to secondhand smoke.  Limit alcohol use. Moderate alcohol use is considered to be:  No more than 2 drinks per day for men.  No more than 1 drink per day for nonpregnant women.  Eat healthy foods. This involves:  Eating 5 or more servings of fruits and vegetables a day.  Making dietary changes that address high blood pressure (hypertension), high cholesterol, diabetes, or obesity.  Manage your cholesterol levels.  Making food choices that are high in fiber and low in saturated fat, trans fat, and cholesterol may control cholesterol levels.  Take any prescribed medicines to control cholesterol as directed by your health care provider.  Manage your diabetes.  Controlling your carbohydrate and sugar intake is recommended to manage diabetes.  Take any prescribed medicines to control diabetes as directed by your health care provider.  Control your hypertension.  Making food choices that are low in salt (sodium), saturated fat, trans fat, and cholesterol is recommended to manage hypertension.  Ask your health care provider if you need treatment to lower your blood pressure. Take any prescribed medicines to control hypertension as directed by your health care provider.  If you are 18-39 years of age, have your blood pressure checked every 3-5 years. If you are 40 years of age or older, have your blood pressure checked every year.  Maintain a healthy weight.  Reducing calorie intake and making food choices that are low in sodium, saturated fat, trans fat, and cholesterol are recommended to manage  weight.  Stop drug abuse.  Avoid taking birth control pills.  Talk to your health care provider about the risks of taking birth control pills if you are over 35 years old, smoke, get migraines, or have ever had a blood clot.  Get evaluated for sleep disorders (sleep apnea).  Talk to your health care provider about getting a sleep evaluation if you snore a lot or have excessive sleepiness.  Take medicines only as directed by your health care provider.  For some people, aspirin or blood thinners (anticoagulants) are helpful in reducing the risk of forming abnormal blood clots that can lead to stroke. If you have the irregular heart rhythm of atrial fibrillation, you should be on a blood thinner unless there is a good reason you cannot take them.  Understand all your medicine instructions.  Make sure that other conditions (such as anemia or atherosclerosis) are addressed. Get help right away if:  You have sudden weakness or numbness of the face, arm, or leg, especially on one side of the body.  Your face or eyelid droops to one side.  You have sudden confusion.  You have trouble speaking (aphasia) or understanding.  You have sudden trouble seeing in one or both eyes.  You have sudden trouble walking.  You have dizziness.  You have a loss of balance or coordination.  You have a sudden, severe headache with no known cause.  You have new chest pain or an irregular heartbeat. Any of these symptoms may represent a serious problem that is an emergency. Do not wait to see if the symptoms will go away.   Get medical help at once. Call your local emergency services (911 in [place] Do not drive yourself to the hospital. This information is not intended to replace advice given to you by your health care provider. Make sure you discuss any questions you have with your health care provider. Document Released: 03/07/2004 Document Revised: 07/06/2015 Document Reviewed: 07/31/2012 Elsevier  Interactive Patient Education  2017 Elsevier Inc.      Preventing Cerebrovascular Disease Arteries are blood vessels that carry blood that contains oxygen from the heart to all parts of the body. Cerebrovascular disease affects arteries that supply the brain. Any condition that blocks or disrupts blood flow to the brain can cause cerebrovascular disease. Brain cells that lose blood supply start to die within minutes (stroke). Stroke is the main danger of cerebrovascular disease. Atherosclerosis and high blood pressure are common causes of cerebrovascular disease. Atherosclerosis is narrowing and hardening of an artery that results when fat, cholesterol, calcium, or other substances (plaque) build up inside an artery. Plaque reduces blood flow through the artery. High blood pressure increases the risk of bleeding inside the brain. Making diet and lifestyle changes to prevent atherosclerosis and high blood pressure lowers your risk of cerebrovascular disease. What nutrition changes can be made?  Eat more fruits, vegetables, and whole grains.  Reduce how much saturated fat you eat. To do this, eat less red meat and fewer full-fat dairy products.  Eat healthy proteins instead of red meat. Healthy proteins include:  Fish. Eat fish that contains heart-healthy omega-3 fatty acids, twice a week. Examples include salmon, albacore tuna, mackerel, and herring.  Chicken.  Nuts.  Low-fat or nonfat yogurt.  Avoid processed meats, like bacon and lunchmeat.  Avoid foods that contain:  A lot of sugar, such as sweets and drinks with added sugar.  A lot of salt (sodium). Avoid adding extra salt to your food, as told by your health care provider.  Trans fats, such as margarine and baked goods. Trans fats may be listed as "partially hydrogenated oils" on food labels.  Check food labels to see how much sodium, sugar, and trans fats are in foods.  Use vegetable oils that contain low amounts of  saturated fat, such as olive oil or canola oil. What lifestyle changes can be made?  Drink alcohol in moderation. This means no more than 1 drink a day for nonpregnant women and 2 drinks a day for men. One drink equals 12 oz of beer, 5 oz of wine, or 1 oz of hard liquor.  If you are overweight, ask your health care provider to recommend a weight-loss plan for you. Losing 5-10 lb (2.2-4.5 kg) can reduce your risk of diabetes, atherosclerosis, and high blood pressure.  Exercise for 30?60 minutes on most days, or as much as told by your health care provider.  Do moderate-intensity exercise, such as brisk walking, bicycling, and water aerobics. Ask your health care provider which activities are safe for you.  Do not use any products that contain nicotine or tobacco, such as cigarettes and e-cigarettes. If you need help quitting, ask your health care provider. Why are these changes important? Making these changes lowers your risk of many diseases that can cause cerebrovascular disease and stroke. Stroke is a leading cause of death and disability. Making these changes also improves your overall health and quality of life. What can I do to lower my risk? The following factors make you more likely to develop cerebrovascular disease:  Being overweight.  Smoking.  Being physically   inactive.  Eating a high-fat diet.  Having certain health conditions, such as:  Diabetes.  High blood pressure.  Heart disease.  Atherosclerosis.  High cholesterol.  Sickle cell disease. Talk with your health care provider about your risk for cerebrovascular disease. Work with your health care provider to control diseases that you have that may contribute to cerebrovascular disease. Your health care provider may prescribe medicines to help prevent major causes of cerebrovascular disease. Where to find more information: Learn more about preventing cerebrovascular disease from:  National Heart, Lung, and  Blood Institute: www.nhlbi.nih.gov/health/health-topics/topics/stroke  Centers for Disease Control and Prevention: cdc.gov/stroke/about.htm Summary  Cerebrovascular disease can lead to a stroke.  Atherosclerosis and high blood pressure are major causes of cerebrovascular disease.  Making diet and lifestyle changes can reduce your risk of cerebrovascular disease.  Work with your health care provider to get your risk factors under control to reduce your risk of cerebrovascular disease. This information is not intended to replace advice given to you by your health care provider. Make sure you discuss any questions you have with your health care provider. Document Released: 02/12/2015 Document Revised: 08/18/2015 Document Reviewed: 02/12/2015 Elsevier Interactive Patient Education  2017 Elsevier Inc.  

## 2016-05-01 DIAGNOSIS — R358 Other polyuria: Secondary | ICD-10-CM | POA: Diagnosis not present

## 2016-05-01 DIAGNOSIS — N390 Urinary tract infection, site not specified: Secondary | ICD-10-CM | POA: Diagnosis not present

## 2016-05-01 NOTE — Progress Notes (Signed)
No worries, thank you for always sending your stuff to [place]! MCr

## 2016-05-01 NOTE — Progress Notes (Signed)
Thanks, [name]. I forwarded your note to Dr. Peter Martinique, who is her usual Cardiologist. [facility]

## 2016-05-06 DIAGNOSIS — H33059 Total retinal detachment, unspecified eye: Secondary | ICD-10-CM | POA: Diagnosis not present

## 2016-05-06 DIAGNOSIS — H43812 Vitreous degeneration, left eye: Secondary | ICD-10-CM | POA: Diagnosis not present

## 2016-05-06 DIAGNOSIS — H33012 Retinal detachment with single break, left eye: Secondary | ICD-10-CM | POA: Diagnosis not present

## 2016-05-06 DIAGNOSIS — H35352 Cystoid macular degeneration, left eye: Secondary | ICD-10-CM | POA: Diagnosis not present

## 2016-05-08 NOTE — Addendum Note (Signed)
Addended by: Lianne Cure A on: 05/08/2016 09:52 AM   Modules accepted: Orders

## 2016-05-13 ENCOUNTER — Encounter: Payer: Self-pay | Admitting: Cardiology

## 2016-05-13 ENCOUNTER — Ambulatory Visit (INDEPENDENT_AMBULATORY_CARE_PROVIDER_SITE_OTHER): Payer: Medicare Other | Admitting: Pharmacist Clinician (PhC)/ Clinical Pharmacy Specialist

## 2016-05-13 DIAGNOSIS — I4891 Unspecified atrial fibrillation: Secondary | ICD-10-CM

## 2016-05-13 DIAGNOSIS — I6521 Occlusion and stenosis of right carotid artery: Secondary | ICD-10-CM

## 2016-05-13 LAB — POCT INR: INR: 3.1

## 2016-05-15 DIAGNOSIS — E785 Hyperlipidemia, unspecified: Secondary | ICD-10-CM | POA: Diagnosis not present

## 2016-05-15 DIAGNOSIS — I10 Essential (primary) hypertension: Secondary | ICD-10-CM | POA: Diagnosis not present

## 2016-05-15 DIAGNOSIS — Z0000 Encounter for general adult medical examination without abnormal findings: Secondary | ICD-10-CM | POA: Diagnosis not present

## 2016-05-15 DIAGNOSIS — D649 Anemia, unspecified: Secondary | ICD-10-CM | POA: Diagnosis not present

## 2016-05-15 DIAGNOSIS — N390 Urinary tract infection, site not specified: Secondary | ICD-10-CM | POA: Diagnosis not present

## 2016-05-20 DIAGNOSIS — G4733 Obstructive sleep apnea (adult) (pediatric): Secondary | ICD-10-CM | POA: Diagnosis not present

## 2016-05-20 DIAGNOSIS — R600 Localized edema: Secondary | ICD-10-CM | POA: Diagnosis not present

## 2016-05-20 DIAGNOSIS — I509 Heart failure, unspecified: Secondary | ICD-10-CM | POA: Diagnosis not present

## 2016-05-27 ENCOUNTER — Ambulatory Visit (INDEPENDENT_AMBULATORY_CARE_PROVIDER_SITE_OTHER): Payer: Medicare Other | Admitting: Physician Assistant

## 2016-05-27 ENCOUNTER — Ambulatory Visit: Payer: Medicare Other | Admitting: Cardiology

## 2016-05-27 ENCOUNTER — Encounter: Payer: Self-pay | Admitting: Physician Assistant

## 2016-05-27 VITALS — BP 175/93 | HR 81 | Ht 62.0 in | Wt 227.6 lb

## 2016-05-27 DIAGNOSIS — I10 Essential (primary) hypertension: Secondary | ICD-10-CM | POA: Diagnosis not present

## 2016-05-27 DIAGNOSIS — I4821 Permanent atrial fibrillation: Secondary | ICD-10-CM

## 2016-05-27 DIAGNOSIS — I5032 Chronic diastolic (congestive) heart failure: Secondary | ICD-10-CM

## 2016-05-27 DIAGNOSIS — I482 Chronic atrial fibrillation: Secondary | ICD-10-CM | POA: Diagnosis not present

## 2016-05-27 DIAGNOSIS — E785 Hyperlipidemia, unspecified: Secondary | ICD-10-CM

## 2016-05-27 DIAGNOSIS — N3020 Other chronic cystitis without hematuria: Secondary | ICD-10-CM | POA: Diagnosis not present

## 2016-05-27 DIAGNOSIS — I6521 Occlusion and stenosis of right carotid artery: Secondary | ICD-10-CM

## 2016-05-27 DIAGNOSIS — I779 Disorder of arteries and arterioles, unspecified: Secondary | ICD-10-CM | POA: Diagnosis not present

## 2016-05-27 DIAGNOSIS — I739 Peripheral vascular disease, unspecified: Secondary | ICD-10-CM

## 2016-05-27 NOTE — Patient Instructions (Signed)
Your physician recommends that you schedule a follow-up appointment in: 6 months with Dr. Martinique

## 2016-05-27 NOTE — Progress Notes (Signed)
Cardiology Office Note    Date:  05/28/2016   ID:  [name], DOB 27-Dec-1934, MRN [id number]  PCP:  Horatio Pel, MD  Cardiologist:  Dr. Martinique (previously Dr. Mare Ferrari)  Chief Complaint  Patient presents with  . Follow-up    6 months; seen for Dr. Martinique  . Leg Pain    cramping in legs at night.     History of Present Illness:  [name] is a 82 y.o. female with PMH of carotid artery disease, Crohn's disease, fibromyalgia, history of post hip surgery PE, hypertension, hypertriglyceridemia, chronic diastolic heart failure and permanent atrial fibrillation. She was admitted to the hospital in February 2015 due to new onset of atrial fibrillation. She was already on long-term Coumadin at the time for history of PE. Diltiazem for rate control was added to her medical regimen. She also had a history of right carotid artery occlusion and 50% left carotid artery stenosis followed by Dr. Donnetta Hutching. She does not have significant coronary artery disease, cardiac catheterization performed in 1994 showed only mild plaque. She had a nuclear stress test in December 2011 which showed no ischemia, her ejection fraction was 78% the time. She is only on Lasix every other day as daily dose of Lasix make her feel wiped out. Her weight was variable, however respond readily to Lasix. She does have markedly fatigue, Dr. Martinique has tried to switched her by diltiazem, this did not have significant improvement. It was recommended for her to stop taking Lozol (Indapamide).  She has been very much compliant with Coumadin, INR largely therapeutic for the past year. It seems her primary care physician is adjusting her medication. Her blood pressure is elevated today, even on manual recheck by myself, it continued to be elevated. She says her blood pressure was normal at her primary care physician's office a few days ago. I will hold off on adjusting her blood pressure since family physicians adjusting it. Her  diuretic has also been adjusted. She is euvolemic on physical exam.   Past Medical History:  Diagnosis Date  . Acid reflux disease   . Atrial fibrillation ([place])   . Carotid artery occlusion   . Chronic diastolic CHF (congestive heart failure) (HCC)    Hypertensive heart disease 02-12-14  . Crohn disease ([place] [place])   . Crohn's disease ([place])   . Detached retina   . Fibromyalgia   . Fibromyalgia   . GERD (gastroesophageal reflux disease)   . Gout   . H/O hiatal hernia   . High triglycerides   . History of stomach ulcers 1950's  . Hypertension   . Long term current use of anticoagulant   . Obstructive sleep apnea on CPAP   . Osteoarthritis    PAIN AND OA LEF T HIP AND BOTH SHOULDERS ARE BONE ON BONE AND PAINFUL  . Peripheral vascular disease (HCC)    KNOWN RIGHT INTERNAL CAROTID ARTERY OCCLUSION (NO STROKE)  --40 TO 59% STENOSIS LEFT ICA-FOLLOWED BY DR. EARLY WITH DOPPLER STUDY EVERY 6 MONTHS  . Pulmonary embolism ([place]) 2011   a. Hx of PE in 02/2009 after R hip surgery, venous dopplers negative, long-term Coumadin.  [name] Kitchen PVC (premature ventricular contraction)    PT STATES HX OF PVC'S ON EKG  . Recurrent upper respiratory infection (URI)    BRONCHITIS FEB 2013--SLIGHT COUGH NON-PRODUCTIVE NOW  . Recurrent UTI (urinary tract infection)    "on daily medicine" (05/14/2012)  . Tinnitus   . Vertigo     Past  Surgical History:  Procedure Laterality Date  . APPENDECTOMY  1950's  . BACK SURGERY  10/29/06   Central and foraminal decompression L3-L4, L4-L5, and L5-S1 with inspection of L4-L5 and L5-S1 disc on the right  . CARDIAC CATHETERIZATION  1970's   "maybe 2" (05/14/2012)  . CARDIAC CATHETERIZATION    . CATARACT EXTRACTION W/ INTRAOCULAR LENS IMPLANT Right 2009  . CHOLECYSTECTOMY  ~ 1988  . DECOMPRESSIVE LUMBAR LAMINECTOMY LEVEL 3  ~ 2002  . DILATION AND CURETTAGE OF UTERUS     "several; from miscarriages" (05/14/2012)  . EXCISION MORTON'S NEUROMA Right 05/20/00   "foot" (05/14/2012)  . EYE  SURGERY Left 2014   Detached retina  . EYE SURGERY Left August 23, 2013   Cataract  . FEMUR FRACTURE SURGERY Right 02/21/09   Open reduction internal fixation of right periprosthetic  femur fracture utilizing Zimmer cables times fiv  . FRACTURE SURGERY  2011   Right Femur Fx  . HAMMER TOE SURGERY Left 10/25/08   "toe next to big to" (05/14/2012)  . JOINT REPLACEMENT  2009   Right Hip replacement  . JOINT REPLACEMENT  2012   Right knee replacement  . JOINT REPLACEMENT  06/17/11   Left Hip replacement  . KNEE ARTHROSCOPY Right 07/26/05  . REPLACEMENT TOTAL KNEE Right 02/19/2010  . SPINE SURGERY    . [place]?  . TOTAL HIP ARTHROPLASTY Right 12/08/02   Osteonics total hip replacement  . TOTAL HIP ARTHROPLASTY  06/17/2011   Procedure: TOTAL HIP ARTHROPLASTY;  Surgeon: Gearlean Alf, MD;  Location: [facility];  Service: Orthopedics;  Laterality: Left;  . TOTAL SHOULDER ARTHROPLASTY Left 05/14/2012  . TOTAL SHOULDER ARTHROPLASTY Left 05/14/2012   Procedure: TOTAL SHOULDER ARTHROPLASTY;  Surgeon: Marin Shutter, MD;  Location: [facility];  Service: Orthopedics;  Laterality: Left;  [name] Kitchen VAGINAL HYSTERECTOMY  1971    Current Medications: Outpatient Medications Prior to Visit  Medication Sig Dispense Refill  . Biotin 2.5 MG CAPS Take 1 capsule by mouth daily.    . cetirizine (ZYRTEC) 10 MG tablet Take 10 mg by mouth daily.    . clobetasol cream (TEMOVATE) 5.63 % Apply 1 application topically 2 (two) times daily.    [name] Kitchen CRANBERRY CONCENTRATE PO Take 1 tablet by mouth daily.    . dorzolamide-timolol (COSOPT) 22.3-6.8 MG/ML ophthalmic solution Place 1 drop into both eyes 2 (two) times daily.     . furosemide (LASIX) 20 MG tablet Take 0.5 tablets (10 mg total) by mouth as directed. Mondays, Wednesdays, Fridays 30 tablet 6  . HYDROcodone-acetaminophen (NORCO) 10-325 MG per tablet Take 1 tablet by mouth every 6 (six) hours as needed (pain).     . indapamide (LOZOL) 2.5 MG tablet Take 2.5 mg  by mouth daily.    [name] Kitchen ketoconazole (NIZORAL) 2 % cream Apply 1 application topically 2 (two) times daily as needed for irritation.     . nebivolol (BYSTOLIC) 10 MG tablet Take 1 tablet (10 mg total) by mouth at bedtime. 90 tablet 3  . omeprazole (PRILOSEC) 40 MG capsule Take 40 mg by mouth daily.    . potassium chloride (K-DUR,KLOR-CON) 10 MEQ tablet Take 10 mEq by mouth 2 (two) times daily.    . Probiotic Product (ALIGN PO) Take 1 capsule by mouth daily as needed.    . silver sulfADIAZINE (SILVADENE) 1 % cream Apply 1 application topically daily. 50 g 0  . warfarin (COUMADIN) 2.5 MG tablet TAKE 1-1&1/2 TABLETS DAILY AS DIRECTED BY  COUMADIN CLINIC. 40 tablet 2   No facility-administered medications prior to visit.      Allergies:   Codeine; Cymbalta [duloxetine hcl]; Diltiazem cd [diltiazem hcl er beads]; Metoprolol; Nortriptyline; Penicillins; Statins; Tetanus toxoid adsorbed; Tetanus toxoids; Ciprofloxacin; Lyrica [pregabalin]; and Penicillin g   Social History   Social History  . Marital status: Married    Spouse name: N/A  . Number of children: N/A  . Years of education: N/A   Social History Main Topics  . Smoking status: Former Smoker    Packs/day: 0.50    Years: 15.00    Types: Cigarettes    Quit date: 02/11/1986  . Smokeless tobacco: Never Used  . Alcohol use 0.0 oz/week     Comment: 05/14/2012 "have 2-3 drinks/yr" maybe  . Drug use: No  . Sexual activity: No   Other Topics Concern  . None   Social History Narrative   [facility] arranged CP[facility].  Lives in a one level house with two steps two enter house with her husband.  Uses a cane.  Has had OT and an RN for her shoulder service.  [employer].       Family History:  The patient's family history includes AAA (abdominal aortic aneurysm) in her mother; Aneurysm in her mother; Diabetes in her paternal grandfather and son; Heart attack in her brother; Heart attack (age of onset: 52) in her father; Heart disease in her  brother, father, and mother; Hyperlipidemia in her brother; Hypertension in her brother, brother, father, and mother; Peripheral vascular disease in her father; Stroke in her mother.   ROS:   Please see the history of present illness.    ROS All other systems reviewed and are negative.   PHYSICAL EXAM:   VS:  BP (!) 175/93   Pulse 81   Ht 5' 2"  (1.575 m)   Wt 227 lb 9.6 oz (103.2 kg)   BMI 41.63 kg/m    GEN: Well nourished, well developed, in no acute distress  HEENT: normal  Neck: no JVD, carotid bruits, or masses Cardiac: irregularly irregular; no murmurs, rubs, or gallops,no edema  Respiratory:  clear to auscultation bilaterally, normal work of breathing GI: soft, nontender, nondistended, + BS MS: no deformity or atrophy  Skin: warm and dry, no rash Neuro:  Alert and Oriented x 3, Strength and sensation are intact Psych: euthymic mood, full affect  Wt Readings from Last 3 Encounters:  05/27/16 227 lb 9.6 oz (103.2 kg)  04/30/16 229 lb 8 oz (104.1 kg)  11/20/15 222 lb 9.6 oz (101 kg)      Studies/Labs Reviewed:   EKG:  EKG is ordered today.  The ekg ordered today demonstrates Atrial fibrillation with diffuse T-wave inversion in inferolateral leads.  Recent Labs: No results found for requested labs within last 8760 hours.   Lipid Panel No results found for: CHOL, TRIG, HDL, CHOLHDL, VLDL, LDLCALC, LDLDIRECT  Additional studies/ records that were reviewed today include:   Carotid doppler 04/30/2016    ASSESSMENT:    1. Permanent atrial fibrillation ([place])   2. Bilateral carotid artery disease ([place])   3. Essential hypertension   4. Hyperlipidemia, unspecified hyperlipidemia type   5. Chronic diastolic heart failure (HCC)      PLAN:  In order of problems listed above:  1. Permanent atrial fibrillation on Coumadin: CHA2DS2-Vasc score 5 (female, age, HTN, PAD). Rate controlled on physical exam. Her heart rate is mainly in the 80s to 90s, ideally would up titrate  the  beta blocker, however she has history of severe fatigue on increased dose.  2. Carotid artery disease: known occluded right ICA, and moderate left ICA disease. Last carotid ultrasound on 04/30/2016  3. Hypertension: Blood pressure elevated today on initial presentation, manual recheck by myself also shows elevated blood pressure in the 170s as well. Interestingly, according to the patient, her blood pressure was in the 120s to 130s at her primary care physician's office a few days ago. Also her primary care physician is adjusting her low pressure medication as well. I will hold off on adjusting her medication.  4. Hyperlipidemia: Intolerant to statins.  5. Chronic diastolic heart failure: Her diuretic dosing has changed since last year and is currently being adjusted by her primary care physician. She is euvolemic on physical exam.    Medication Adjustments/Labs and Tests Ordered: Current medicines are reviewed at length with the patient today.  Concerns regarding medicines are outlined above.  Medication changes, Labs and Tests ordered today are listed in the Patient Instructions below. Patient Instructions  Your physician recommends that you schedule a follow-up appointment in: 6 months with Dr. Martinique    Signed, [place], [place]  05/28/2016 7:24 AM    [address], [place], [place]  [address] Phone: [phone]; Fax: [phone]

## 2016-05-28 ENCOUNTER — Encounter: Payer: Self-pay | Admitting: Physician Assistant

## 2016-05-28 DIAGNOSIS — E119 Type 2 diabetes mellitus without complications: Secondary | ICD-10-CM | POA: Diagnosis not present

## 2016-05-28 DIAGNOSIS — H401113 Primary open-angle glaucoma, right eye, severe stage: Secondary | ICD-10-CM | POA: Diagnosis not present

## 2016-05-28 DIAGNOSIS — Z961 Presence of intraocular lens: Secondary | ICD-10-CM | POA: Diagnosis not present

## 2016-05-28 DIAGNOSIS — H40052 Ocular hypertension, left eye: Secondary | ICD-10-CM | POA: Diagnosis not present

## 2016-05-29 ENCOUNTER — Ambulatory Visit (INDEPENDENT_AMBULATORY_CARE_PROVIDER_SITE_OTHER): Payer: Medicare Other | Admitting: Podiatry

## 2016-05-29 DIAGNOSIS — L84 Corns and callosities: Secondary | ICD-10-CM

## 2016-05-29 DIAGNOSIS — G609 Hereditary and idiopathic neuropathy, unspecified: Secondary | ICD-10-CM

## 2016-05-29 NOTE — Progress Notes (Signed)
Patient ID: [name], female   DOB: [birth year], 82 y.o.   MRN: [id number]   Subjective: Patient presents for ongoing care for pre-ulcerative in ulcerative plantar lesion on the plantar first MPJ.The patient's husband is present in the treatment room today  Objective: Orientated 3 DP and PT pulses 2/4 bilaterally Capillary reflex immediate bilaterally Sensation to 10 g monofilament wire intact 0/5 bilaterally Vibratory sensation nonreactive bilaterally Ankle reflex equal and reactive bilaterally Hemorrhagic callus plantar left first MPJ that is slight bleeding drainage There is no surrounding erythema, edema, warmth Manual motor testing dorsi flexion,.plantar flexion, inversion, eversion 5/5 bilaterally  Assessment: Idiopathic peripheral neuropathy Pre-ulcerative plantar callus left first MPJ  Plan: Debride plantar pre-ulcerative callus, and apply Silvadene dressing. Patient instructed to apply Silvadene dressing daily until drainage subsides Patient will resume wearing athletic shoe with custom insole with additional felt pad to offload the plantar first MPJ   Reappoint 4 weeks

## 2016-05-29 NOTE — Patient Instructions (Signed)
Apply Silvadene cream daily until drainage stops

## 2016-05-30 DIAGNOSIS — R600 Localized edema: Secondary | ICD-10-CM | POA: Diagnosis not present

## 2016-05-30 DIAGNOSIS — I509 Heart failure, unspecified: Secondary | ICD-10-CM | POA: Diagnosis not present

## 2016-05-30 DIAGNOSIS — D649 Anemia, unspecified: Secondary | ICD-10-CM | POA: Diagnosis not present

## 2016-06-04 DIAGNOSIS — I509 Heart failure, unspecified: Secondary | ICD-10-CM | POA: Diagnosis not present

## 2016-06-09 ENCOUNTER — Other Ambulatory Visit: Payer: Self-pay | Admitting: Cardiology

## 2016-06-11 DIAGNOSIS — Z961 Presence of intraocular lens: Secondary | ICD-10-CM | POA: Diagnosis not present

## 2016-06-11 DIAGNOSIS — H401113 Primary open-angle glaucoma, right eye, severe stage: Secondary | ICD-10-CM | POA: Diagnosis not present

## 2016-06-11 DIAGNOSIS — H40052 Ocular hypertension, left eye: Secondary | ICD-10-CM | POA: Diagnosis not present

## 2016-06-18 DIAGNOSIS — I509 Heart failure, unspecified: Secondary | ICD-10-CM | POA: Diagnosis not present

## 2016-06-18 DIAGNOSIS — R600 Localized edema: Secondary | ICD-10-CM | POA: Diagnosis not present

## 2016-06-18 DIAGNOSIS — J301 Allergic rhinitis due to pollen: Secondary | ICD-10-CM | POA: Diagnosis not present

## 2016-06-24 ENCOUNTER — Ambulatory Visit (INDEPENDENT_AMBULATORY_CARE_PROVIDER_SITE_OTHER): Payer: Medicare Other | Admitting: Pharmacist Clinician (PhC)/ Clinical Pharmacy Specialist

## 2016-06-24 DIAGNOSIS — I4891 Unspecified atrial fibrillation: Secondary | ICD-10-CM | POA: Diagnosis not present

## 2016-06-24 DIAGNOSIS — I6521 Occlusion and stenosis of right carotid artery: Secondary | ICD-10-CM

## 2016-06-24 LAB — POCT INR: INR: 3.3

## 2016-06-25 DIAGNOSIS — I509 Heart failure, unspecified: Secondary | ICD-10-CM | POA: Diagnosis not present

## 2016-06-26 ENCOUNTER — Ambulatory Visit: Payer: Medicare Other | Admitting: Podiatry

## 2016-07-12 DIAGNOSIS — R600 Localized edema: Secondary | ICD-10-CM | POA: Diagnosis not present

## 2016-07-12 DIAGNOSIS — I10 Essential (primary) hypertension: Secondary | ICD-10-CM | POA: Diagnosis not present

## 2016-07-12 DIAGNOSIS — I509 Heart failure, unspecified: Secondary | ICD-10-CM | POA: Diagnosis not present

## 2016-07-15 ENCOUNTER — Ambulatory Visit (INDEPENDENT_AMBULATORY_CARE_PROVIDER_SITE_OTHER): Payer: Medicare Other | Admitting: Pharmacist Clinician (PhC)/ Clinical Pharmacy Specialist

## 2016-07-15 DIAGNOSIS — I4891 Unspecified atrial fibrillation: Secondary | ICD-10-CM | POA: Diagnosis not present

## 2016-07-15 DIAGNOSIS — I6521 Occlusion and stenosis of right carotid artery: Secondary | ICD-10-CM

## 2016-07-15 LAB — POCT INR: INR: 2.4

## 2016-07-18 DIAGNOSIS — I10 Essential (primary) hypertension: Secondary | ICD-10-CM | POA: Diagnosis not present

## 2016-07-21 ENCOUNTER — Other Ambulatory Visit: Payer: Self-pay | Admitting: Cardiology

## 2016-07-23 DIAGNOSIS — I509 Heart failure, unspecified: Secondary | ICD-10-CM | POA: Diagnosis not present

## 2016-07-23 DIAGNOSIS — E876 Hypokalemia: Secondary | ICD-10-CM | POA: Diagnosis not present

## 2016-07-24 ENCOUNTER — Ambulatory Visit (INDEPENDENT_AMBULATORY_CARE_PROVIDER_SITE_OTHER): Payer: Medicare Other | Admitting: Podiatry

## 2016-07-24 ENCOUNTER — Encounter: Payer: Self-pay | Admitting: Podiatry

## 2016-07-24 VITALS — Temp 97.0°F

## 2016-07-24 DIAGNOSIS — L98491 Non-pressure chronic ulcer of skin of other sites limited to breakdown of skin: Secondary | ICD-10-CM

## 2016-07-24 NOTE — Patient Instructions (Signed)
Apply Silvadene cream to skin ulcer on the bilateral left foot daily, attached foam pad and a small amount of gauze and secure with Coflex tape. Wear the surgical shoe on the left foot and if the orthotic fits in the shoe and does not slide wear the orthotic inside surgical shoe  Diabetes and Foot Care Diabetes may cause you to have problems because of poor blood supply (circulation) to your feet and legs. This may cause the skin on your feet to become thinner, break easier, and heal more slowly. Your skin may become dry, and the skin may peel and crack. You may also have nerve damage in your legs and feet causing decreased feeling in them. You may not notice minor injuries to your feet that could lead to infections or more serious problems. Taking care of your feet is one of the most important things you can do for yourself. Follow these instructions at home:  Wear shoes at all times, even in the house. Do not go barefoot. Bare feet are easily injured.  Check your feet daily for blisters, cuts, and redness. If you cannot see the bottom of your feet, use a mirror or ask someone for help.  Wash your feet with warm water (do not use hot water) and mild soap. Then pat your feet and the areas between your toes until they are completely dry. Do not soak your feet as this can dry your skin.  Apply a moisturizing lotion or petroleum jelly (that does not contain alcohol and is unscented) to the skin on your feet and to dry, brittle toenails. Do not apply lotion between your toes.  Trim your toenails straight across. Do not dig under them or around the cuticle. File the edges of your nails with an emery board or nail file.  Do not cut corns or calluses or try to remove them with medicine.  Wear clean socks or stockings every day. Make sure they are not too tight. Do not wear knee-high stockings since they may decrease blood flow to your legs.  Wear shoes that fit properly and have enough cushioning. To  break in new shoes, wear them for just a few hours a day. This prevents you from injuring your feet. Always look in your shoes before you put them on to be sure there are no objects inside.  Do not cross your legs. This may decrease the blood flow to your feet.  If you find a minor scrape, cut, or break in the skin on your feet, keep it and the skin around it clean and dry. These areas may be cleansed with mild soap and water. Do not cleanse the area with peroxide, alcohol, or iodine.  When you remove an adhesive bandage, be sure not to damage the skin around it.  If you have a wound, look at it several times a day to make sure it is healing.  Do not use heating pads or hot water bottles. They may burn your skin. If you have lost feeling in your feet or legs, you may not know it is happening until it is too late.  Make sure your health care provider performs a complete foot exam at least annually or more often if you have foot problems. Report any cuts, sores, or bruises to your health care provider immediately. Contact a health care provider if:  You have an injury that is not healing.  You have cuts or breaks in the skin.  You have an ingrown nail.  You notice redness on your legs or feet.  You feel burning or tingling in your legs or feet.  You have pain or cramps in your legs and feet.  Your legs or feet are numb.  Your feet always feel cold. Get help right away if:  There is increasing redness, swelling, or pain in or around a wound.  There is a red line that goes up your leg.  Pus is coming from a wound.  You develop a fever or as directed by your health care provider.  You notice a bad smell coming from an ulcer or wound. This information is not intended to replace advice given to you by your health care provider. Make sure you discuss any questions you have with your health care provider. Document Released: 01/26/2000 Document Revised: 07/06/2015 Document Reviewed:  07/07/2012 Elsevier Interactive Patient Education  2017 [employer].

## 2016-07-25 NOTE — Progress Notes (Signed)
Patient ID: [name], female   DOB: Jan 27, 1935, 82 y.o.   MRN: [id number]   Subjective: Patient presents for ongoing care for pre-ulcerative in ulcerative plantar lesion on the plantar first MPJ.The patient's husband is present in the treatment room today  Objective: Orientated 3 DP and PT pulses 2/4 bilaterally Capillary reflex immediate bilaterally Sensation to 10 g monofilament wire intact 0/5 bilaterally Vibratory sensation nonreactive bilaterally Ankle reflex equal and reactive bilaterally Hemorrhagic callus plantar left first MPJ after debridement resulting in a 5 x 3 mm superficial ulcer. The base is granular. There is no surrounding erythema, edema, warmth Manual motor testing dorsi flexion,.plantar flexion, inversion, eversion 5/5 bilaterally  Assessment: Peripheral neuropathy Superficial skin ulcer plantar left first MPJ without clinical sign of infection  Plan: Debride superficial ulcer plantar first MPJ and apply Silvadene cream dressing Patient instructed wear existing surgical shoe on the left foot and place existing accommodative foot orthotic in surgical shoe Limit standing walking  Reappoint 2 weeks

## 2016-08-06 ENCOUNTER — Ambulatory Visit (INDEPENDENT_AMBULATORY_CARE_PROVIDER_SITE_OTHER): Payer: Medicare Other | Admitting: Pharmacist Clinician (PhC)/ Clinical Pharmacy Specialist

## 2016-08-06 DIAGNOSIS — I6521 Occlusion and stenosis of right carotid artery: Secondary | ICD-10-CM

## 2016-08-06 DIAGNOSIS — I4891 Unspecified atrial fibrillation: Secondary | ICD-10-CM | POA: Diagnosis not present

## 2016-08-06 DIAGNOSIS — E876 Hypokalemia: Secondary | ICD-10-CM | POA: Diagnosis not present

## 2016-08-06 LAB — POCT INR: INR: 2.5

## 2016-08-07 ENCOUNTER — Encounter: Payer: Self-pay | Admitting: Podiatry

## 2016-08-07 ENCOUNTER — Ambulatory Visit (INDEPENDENT_AMBULATORY_CARE_PROVIDER_SITE_OTHER): Payer: Medicare Other | Admitting: Podiatry

## 2016-08-07 DIAGNOSIS — L84 Corns and callosities: Secondary | ICD-10-CM | POA: Diagnosis not present

## 2016-08-07 DIAGNOSIS — G609 Hereditary and idiopathic neuropathy, unspecified: Secondary | ICD-10-CM | POA: Diagnosis not present

## 2016-08-07 NOTE — Patient Instructions (Signed)
Apply Vaseline to the pre-ulcerative callus on the lateral left foot daily Attached foam pad around callused area Continue wearing surgical shoe with Plastizote insole If back pain increases alternate between surgical shoe and athletic shoe with custom insert

## 2016-08-07 NOTE — Progress Notes (Signed)
Patient ID: [name], female   DOB: 20-Jan-1935, 82 y.o.   MRN: [id number]    Subjective: Patient presents for ongoing care for pre-ulcerative in ulcerative plantar lesion on the plantar first MPJ.The patient's husband is present in the treatment room today  Objective: Orientated 3 DP and PT pulses 2/4 bilaterally Capillary reflex immediate bilaterally Sensation to 10 g monofilament wire intact 0/5 bilaterally Vibratory sensation nonreactive bilaterally Ankle reflex equal and reactive bilaterally Hemorrhagic callus plantar left first MPJ after debridement remains closed There is no surrounding erythema, edema, warmth Manual motor testing dorsi flexion,.plantar flexion, inversion, eversion 5/5 bilaterally  Assessment: Peripheral neuropathy Pre-ulcerative plantar callus first MPJ left  Plan: Debrided pre-ulcerative plantar callus left Apply protective pad around plantar callus Continue wearing surgical shoe with Plastizote insole Limit standing walking  Reappoint 2 weeks

## 2016-08-20 DIAGNOSIS — G8929 Other chronic pain: Secondary | ICD-10-CM | POA: Diagnosis not present

## 2016-08-20 DIAGNOSIS — M25572 Pain in left ankle and joints of left foot: Secondary | ICD-10-CM | POA: Diagnosis not present

## 2016-08-28 ENCOUNTER — Encounter: Payer: Self-pay | Admitting: Podiatry

## 2016-08-28 ENCOUNTER — Ambulatory Visit (INDEPENDENT_AMBULATORY_CARE_PROVIDER_SITE_OTHER): Payer: Medicare Other | Admitting: Podiatry

## 2016-08-28 VITALS — BP 148/70 | HR 91 | Temp 98.3°F | Resp 18

## 2016-08-28 DIAGNOSIS — G609 Hereditary and idiopathic neuropathy, unspecified: Secondary | ICD-10-CM | POA: Diagnosis not present

## 2016-08-28 DIAGNOSIS — L84 Corns and callosities: Secondary | ICD-10-CM | POA: Diagnosis not present

## 2016-08-28 NOTE — Progress Notes (Signed)
Patient ID: [name], female   DOB: 15-Jul-1934, 82 y.o.   MRN: [id number]    Subjective: Patient presents for ongoing care for pre-ulcerative in ulcerative plantar lesion on the plantar first MPJ.The patient's husband is present in the treatment room today  Objective: Orientated 3 DP and PT pulses 2/4 bilaterally Capillary reflex immediate bilaterally Sensation to 10 g monofilament wire intact 0/5 bilaterally Vibratory sensation nonreactive bilaterally Ankle reflex equal and reactive bilaterally Hemorrhagic callus plantar left first MPJ after debridement remains closed There is no surrounding erythema, edema, warmth Manual motor testing dorsi flexion,.plantar flexion, inversion, eversion 5/5 bilaterally  Assessment: Peripheral neuropathy Pre-ulcerative plantar callus first MPJ left  Plan: Debrided pre-ulcerative plantar callus left Apply protective pad around plantar callus Wear prescription shoe orthotic in athletic style shoe Limit standing walking  Reappoint 4 weeks

## 2016-09-06 ENCOUNTER — Ambulatory Visit (INDEPENDENT_AMBULATORY_CARE_PROVIDER_SITE_OTHER): Payer: Medicare Other | Admitting: Pharmacist

## 2016-09-06 DIAGNOSIS — I4891 Unspecified atrial fibrillation: Secondary | ICD-10-CM | POA: Diagnosis not present

## 2016-09-06 DIAGNOSIS — I6521 Occlusion and stenosis of right carotid artery: Secondary | ICD-10-CM

## 2016-09-06 LAB — POCT INR: INR: 2.2

## 2016-09-11 ENCOUNTER — Encounter (HOSPITAL_COMMUNITY): Payer: Self-pay | Admitting: Emergency Medicine

## 2016-09-11 ENCOUNTER — Emergency Department (HOSPITAL_COMMUNITY)
Admission: EM | Admit: 2016-09-11 | Discharge: 2016-09-12 | Disposition: A | Discharge status: Home / Self Care | Payer: Medicare Other | Attending: Emergency Medicine | Admitting: Emergency Medicine

## 2016-09-11 DIAGNOSIS — R112 Nausea with vomiting, unspecified: Secondary | ICD-10-CM | POA: Diagnosis not present

## 2016-09-11 DIAGNOSIS — I10 Essential (primary) hypertension: Secondary | ICD-10-CM | POA: Insufficient documentation

## 2016-09-11 DIAGNOSIS — R1110 Vomiting, unspecified: Secondary | ICD-10-CM | POA: Diagnosis not present

## 2016-09-11 DIAGNOSIS — E860 Dehydration: Secondary | ICD-10-CM | POA: Insufficient documentation

## 2016-09-11 DIAGNOSIS — K5730 Diverticulosis of large intestine without perforation or abscess without bleeding: Secondary | ICD-10-CM | POA: Diagnosis not present

## 2016-09-11 DIAGNOSIS — D3501 Benign neoplasm of right adrenal gland: Secondary | ICD-10-CM | POA: Diagnosis not present

## 2016-09-11 DIAGNOSIS — Z7901 Long term (current) use of anticoagulants: Secondary | ICD-10-CM | POA: Diagnosis not present

## 2016-09-11 DIAGNOSIS — I509 Heart failure, unspecified: Secondary | ICD-10-CM | POA: Insufficient documentation

## 2016-09-11 DIAGNOSIS — I4891 Unspecified atrial fibrillation: Secondary | ICD-10-CM

## 2016-09-11 DIAGNOSIS — R197 Diarrhea, unspecified: Secondary | ICD-10-CM

## 2016-09-11 DIAGNOSIS — Z87891 Personal history of nicotine dependence: Secondary | ICD-10-CM | POA: Diagnosis not present

## 2016-09-11 DIAGNOSIS — I517 Cardiomegaly: Secondary | ICD-10-CM | POA: Diagnosis not present

## 2016-09-11 DIAGNOSIS — K2970 Gastritis, unspecified, without bleeding: Secondary | ICD-10-CM | POA: Diagnosis not present

## 2016-09-11 DIAGNOSIS — Z79899 Other long term (current) drug therapy: Secondary | ICD-10-CM | POA: Diagnosis not present

## 2016-09-11 LAB — COMPREHENSIVE METABOLIC PANEL
ALT: 16 U/L (ref 14–54)
ANION GAP: 13 (ref 5–15)
AST: 25 U/L (ref 15–41)
Albumin: 3.5 g/dL (ref 3.5–5.0)
Alkaline Phosphatase: 84 U/L (ref 38–126)
BUN: 15 mg/dL (ref 6–20)
CHLORIDE: 101 mmol/L (ref 101–111)
CO2: 26 mmol/L (ref 22–32)
Calcium: 9.3 mg/dL (ref 8.9–10.3)
Creatinine, Ser: 0.77 mg/dL (ref 0.44–1.00)
GFR calc non Af Amer: >60 mL/min (ref >60)
Glucose, Bld: 154 mg/dL — ABNORMAL HIGH (ref 65–99)
Potassium: 3.7 mmol/L (ref 3.5–5.1)
SODIUM: 140 mmol/L (ref 135–145)
Total Bilirubin: 1 mg/dL (ref 0.3–1.2)
Total Protein: 6.8 g/dL (ref 6.5–8.1)

## 2016-09-11 LAB — CBC WITH DIFFERENTIAL/PLATELET
BASOS PCT: 0 %
Basophils Absolute: 0 10*3/uL (ref 0.0–0.1)
Eosinophils Absolute: 0.1 10*3/uL (ref 0.0–0.7)
Eosinophils Relative: 1 %
HEMATOCRIT: 40.6 % (ref 36.0–46.0)
HEMOGLOBIN: 13.3 g/dL (ref 12.0–15.0)
LYMPHS ABS: 0.9 10*3/uL (ref 0.7–4.0)
Lymphocytes Relative: 9 %
MCH: 27.6 pg (ref 26.0–34.0)
MCHC: 32.8 g/dL (ref 30.0–36.0)
MCV: 84.2 fL (ref 78.0–100.0)
MONOS PCT: 6 %
Monocytes Absolute: 0.5 10*3/uL (ref 0.1–1.0)
NEUTROS ABS: 7.8 10*3/uL — AB (ref 1.7–7.7)
NEUTROS PCT: 84 %
Platelets: 149 10*3/uL — ABNORMAL LOW (ref 150–400)
RBC: 4.82 MIL/uL (ref 3.87–5.11)
RDW: 16.7 % — ABNORMAL HIGH (ref 11.5–15.5)
WBC: 9.3 10*3/uL (ref 4.0–10.5)

## 2016-09-11 LAB — MAGNESIUM: MAGNESIUM: 1 mg/dL — AB (ref 1.7–2.4)

## 2016-09-11 LAB — PROTIME-INR
INR: 1.82
PROTHROMBIN TIME: 21.3 s — AB (ref 11.4–15.2)

## 2016-09-11 LAB — TROPONIN I: Troponin I: <0.03 ng/mL (ref <0.03)

## 2016-09-11 MED ORDER — MAGNESIUM SULFATE 2 GM/50ML IV SOLN
2.0000 g | Freq: Once | INTRAVENOUS | Status: AC
  Administered 2016-09-12: 2 g via INTRAVENOUS
  Filled 2016-09-11: qty 50

## 2016-09-11 MED ORDER — ONDANSETRON HCL 4 MG/2ML IJ SOLN
4.0000 mg | Freq: Once | INTRAMUSCULAR | Status: AC
  Administered 2016-09-11: 4 mg via INTRAVENOUS
  Filled 2016-09-11: qty 2

## 2016-09-11 MED ORDER — MORPHINE SULFATE (PF) 2 MG/ML IV SOLN
4.0000 mg | Freq: Once | INTRAVENOUS | Status: AC
  Administered 2016-09-11: 4 mg via INTRAVENOUS
  Filled 2016-09-11: qty 2

## 2016-09-11 MED ORDER — SODIUM CHLORIDE 0.9 % IV BOLUS (SEPSIS)
1000.0000 mL | Freq: Once | INTRAVENOUS | Status: AC
  Administered 2016-09-11: 1000 mL via INTRAVENOUS

## 2016-09-11 MED ORDER — MORPHINE SULFATE (PF) 2 MG/ML IV SOLN
4.0000 mg | Freq: Once | INTRAVENOUS | Status: AC
  Administered 2016-09-12: 4 mg via INTRAVENOUS
  Filled 2016-09-11: qty 2

## 2016-09-11 MED ORDER — SODIUM CHLORIDE 0.9 % IV BOLUS (SEPSIS)
1000.0000 mL | Freq: Once | INTRAVENOUS | Status: AC
  Administered 2016-09-12: 1000 mL via INTRAVENOUS

## 2016-09-11 MED ORDER — NEBIVOLOL HCL 10 MG PO TABS
10.0000 mg | ORAL_TABLET | Freq: Every day | ORAL | Status: DC
  Administered 2016-09-12: 10 mg via ORAL
  Filled 2016-09-11: qty 1

## 2016-09-11 NOTE — ED Notes (Signed)
Bed: OC69 Expected date:  Expected time:  Means of arrival:  Comments: EMS 82 yo female N/V/D

## 2016-09-11 NOTE — ED Triage Notes (Signed)
Pt from home via EMS.  Reports N/V and diarrhea starting at 10 am and worsening throughout the day.  Reports SOB when lying flat but has hx CHF.  States she hasn't been able to tolerate food/fluids since this am.  A&Ox4.  Ambulatory.  Afebrile. VS: 160/90, 100 bpm (hx afib- irregular rhythm), 18 RR, 152 CBG, 93% RA

## 2016-09-11 NOTE — ED Provider Notes (Signed)
[facility] DEPT Provider Note   CSN: [id number] Arrival date & time: 09/11/16  2129     History   Chief Complaint Chief Complaint  Patient presents with  . Emesis  . Diarrhea    HPI [name] is a 82 y.o. female.  Pt presents to the ED today with n/v/d.  The pt said that sx started today around 1000.  The pt said that she's had sx all day.  She did take Imodium which did not help.  The pt denies any abd pain.  She has not been on any recent abx.  No recent travel.  She denies any f/c.  She does have a hx of a.fib  And is on coumadin.  CHA2DS2/VAS Stroke Risk Points      5 >= 2 Points: High Risk  1 - 1.99 Points: Medium Risk  0 Points: Low Risk    The patient's score has not changed in the past year.:  No Change         Details    Note: External data might be a factor in metrics not marked with    Points Metrics   This score determines the patient's risk of having a stroke if the  patient has atrial fibrillation.       1 Has Congestive Heart Failure:  Yes   0 Has Vascular Disease:  No   1 Has Hypertension:  Yes   2 Age:  26   0 Has Diabetes:  No   0 Had Stroke:  No Had TIA:  No Had thromboembolism:  No   1 Female:  Yes             Past Medical History:  Diagnosis Date  . Acid reflux disease   . Atrial fibrillation ([place])   . Carotid artery occlusion   . Chronic diastolic CHF (congestive heart failure) (HCC)    Hypertensive heart disease 02-12-14  . Crohn disease ([place])   . Crohn's disease ([place])   . Detached retina   . Fibromyalgia   . Fibromyalgia   . GERD (gastroesophageal reflux disease)   . Gout   . H/O hiatal hernia   . High triglycerides   . History of stomach ulcers 1950's  . Hypertension   . Long term current use of anticoagulant   . Obstructive sleep apnea on CPAP   . Osteoarthritis    PAIN AND OA LEF T HIP AND BOTH SHOULDERS ARE BONE ON BONE AND PAINFUL  . Peripheral vascular disease (HCC)    KNOWN RIGHT INTERNAL CAROTID ARTERY  OCCLUSION (NO STROKE)  --40 TO 59% STENOSIS LEFT ICA-FOLLOWED BY DR. EARLY WITH DOPPLER STUDY EVERY 6 MONTHS  . Pulmonary embolism ([place]) 2011   a. Hx of PE in 02/2009 after R hip surgery, venous dopplers negative, long-term Coumadin.  [name] Kitchen PVC (premature ventricular contraction)    PT STATES HX OF PVC'S ON EKG  . Recurrent upper respiratory infection (URI)    BRONCHITIS FEB 2013--SLIGHT COUGH NON-PRODUCTIVE NOW  . Recurrent UTI (urinary tract infection)    "on daily medicine" (05/14/2012)  . Tinnitus   . Vertigo     Patient Active Problem List   Diagnosis Date Noted  . Chronic atrial fibrillation ([place]) 05/15/2015  . Aftercare following surgery of the circulatory system, [place] 09/15/2013  . Essential hypertension 06/17/2013  . Atrial fibrillation with RVR ([place]) 04/01/2013  . Morbid obesity ([place]) 04/01/2013  . OSA on CPAP 04/01/2013  . Chronic diastolic CHF (congestive heart failure) (  [place])   . H/o recurrent pulmonary embolism, on Coumadin 05/24/2012  . Acute on chronic diastolic congestive heart failure ([place]) 05/24/2012  . Normocytic anemia 05/24/2012  . Thrombocytopenia ([place]) 05/24/2012  . Inadequate anticoagulation 05/24/2012  . Elevated brain natriuretic peptide (BNP) level 05/24/2012  . Shoulder arthritis 05/15/2012  . Occlusion and stenosis of carotid artery without mention of cerebral infarction 09/10/2011  . OA (osteoarthritis) of hip 06/17/2011  . Osteoarthritis 01/08/2011  . Fibromyalgia 08/06/2010  . Dyslipidemia 08/06/2010  . Gout 08/06/2010  . Right carotid artery occlusion 08/06/2010    Past Surgical History:  Procedure Laterality Date  . APPENDECTOMY  1950's  . BACK SURGERY  10/29/06   Central and foraminal decompression L3-L4, L4-L5, and L5-S1 with inspection of L4-L5 and L5-S1 disc on the right  . CARDIAC CATHETERIZATION  1970's   "maybe 2" (05/14/2012)  . CARDIAC CATHETERIZATION    . CATARACT EXTRACTION W/ INTRAOCULAR LENS IMPLANT Right 2009  . CHOLECYSTECTOMY  ~  1988  . DECOMPRESSIVE LUMBAR LAMINECTOMY LEVEL 3  ~ 2002  . DILATION AND CURETTAGE OF UTERUS     "several; from miscarriages" (05/14/2012)  . EXCISION MORTON'S NEUROMA Right 05/20/00   "foot" (05/14/2012)  . EYE SURGERY Left 2014   Detached retina  . EYE SURGERY Left August 23, 2013   Cataract  . FEMUR FRACTURE SURGERY Right 02/21/09   Open reduction internal fixation of right periprosthetic  femur fracture utilizing Zimmer cables times fiv  . FRACTURE SURGERY  2011   Right Femur Fx  . HAMMER TOE SURGERY Left 10/25/08   "toe next to big to" (05/14/2012)  . JOINT REPLACEMENT  2009   Right Hip replacement  . JOINT REPLACEMENT  2012   Right knee replacement  . JOINT REPLACEMENT  06/17/11   Left Hip replacement  . KNEE ARTHROSCOPY Right 07/26/05  . REPLACEMENT TOTAL KNEE Right 02/19/2010  . SPINE SURGERY    . [place]?  . TOTAL HIP ARTHROPLASTY Right 12/08/02   Osteonics total hip replacement  . TOTAL HIP ARTHROPLASTY  06/17/2011   Procedure: TOTAL HIP ARTHROPLASTY;  Surgeon: Gearlean Alf, MD;  Location: [facility];  Service: Orthopedics;  Laterality: Left;  . TOTAL SHOULDER ARTHROPLASTY Left 05/14/2012  . TOTAL SHOULDER ARTHROPLASTY Left 05/14/2012   Procedure: TOTAL SHOULDER ARTHROPLASTY;  Surgeon: Marin Shutter, MD;  Location: [facility];  Service: Orthopedics;  Laterality: Left;  [name] Kitchen VAGINAL HYSTERECTOMY  1971    OB History    No data available       Home Medications    Prior to Admission medications   Medication Sig Start Date End Date Taking? Authorizing Provider  allopurinol (ZYLOPRIM) 300 MG tablet Take 300 mg by mouth daily. 06/09/16  Yes [provider]  amLODipine (NORVASC) 2.5 MG tablet Take 2.5 mg by mouth daily.   Yes [provider]  cetirizine (ZYRTEC) 10 MG tablet Take 10 mg by mouth daily.   Yes [provider]  clobetasol cream (TEMOVATE) 1.00 % Apply 1 application topically 2 (two) times daily. 10/03/14  Yes [provider]  CRANBERRY CONCENTRATE PO Take 1 tablet by mouth daily.   Yes [provider]  dorzolamide-timolol (COSOPT) 22.3-6.8 MG/ML ophthalmic solution Place 1 drop into both eyes 2 (two) times daily.  06/28/13  Yes [provider]  furosemide (LASIX) 20 MG tablet Take 0.5 tablets (10 mg total) by mouth as directed. Mondays, Wednesdays, Fridays 02/14/16  Yes [place], Peter M, MD  HYDROcodone-acetaminophen (  NORCO) 10-325 MG per tablet Take 1 tablet by mouth every 6 (six) hours as needed (pain).  01/28/14  Yes [provider]  indapamide (LOZOL) 2.5 MG tablet Take 2.5 mg by mouth daily.   Yes [provider]  ketoconazole (NIZORAL) 2 % cream Apply 1 application topically 2 (two) times daily as needed for irritation.  09/26/11  Yes [provider]  LUMIGAN 0.01 % SOLN Place 1 drop into both eyes at bedtime. 09/04/16  Yes [provider]  nebivolol (BYSTOLIC) 10 MG tablet Take 1 tablet (10 mg total) by mouth at bedtime. 01/08/16  Yes [place], Peter M, MD  omeprazole (PRILOSEC) 40 MG capsule Take 40 mg by mouth daily.   Yes [provider]  potassium chloride (K-DUR,KLOR-CON) 10 MEQ tablet Take 10 mEq by mouth 2 (two) times daily.   Yes [provider]  silver sulfADIAZINE (SILVADENE) 1 % cream Apply 1 application topically daily. 12/12/15  Yes Tuchman, Richard C, DPM  traZODone (DESYREL) 50 MG tablet Take 50 mg by mouth at bedtime. 08/09/16  Yes [provider]  triamcinolone (NASACORT ALLERGY 24HR) 55 MCG/ACT AERO nasal inhaler Place 2 sprays into the nose daily as needed.   Yes [provider]  warfarin (COUMADIN) 2.5 MG tablet TAKE 1 TO 1&1/2 TABLETS DAILY AS DIRECTED BY COUMADIN CLINIC. 07/22/16  Yes [place], Peter M, MD    Family History Family History  Problem Relation Age of Onset  . Heart disease Father        Heart Disease before age 13  . Heart attack Father 58  . Hypertension Father   . Peripheral  vascular disease Father        Right  leg amputation  . Heart disease Mother        AAA  . Stroke Mother   . Aneurysm Mother   . Hypertension Mother   . AAA (abdominal aortic aneurysm) Mother   . Diabetes Paternal Grandfather   . Hypertension Brother   . Heart disease Brother        Heart Disease before age 60  . Hyperlipidemia Brother   . Heart attack Brother   . Hypertension Brother   . Diabetes Son     Social History Social History  Substance Use Topics  . Smoking status: Former Smoker    Packs/day: 0.50    Years: 15.00    Types: Cigarettes    Quit date: 02/11/1986  . Smokeless tobacco: Never Used  . Alcohol use 0.0 oz/week     Comment: 05/14/2012 "have 2-3 drinks/yr" maybe     Allergies   Codeine; Cymbalta [duloxetine hcl]; Diltiazem cd [diltiazem hcl er beads]; Metoprolol; Nortriptyline; Penicillins; Statins; Tetanus toxoid adsorbed; Tetanus toxoids; Ciprofloxacin; Lyrica [pregabalin]; and Penicillin g   Review of Systems Review of Systems  Gastrointestinal: Positive for diarrhea, nausea and vomiting.  All other systems reviewed and are negative.    Physical Exam Updated Vital Signs BP 133/83   Pulse (!) 101   Temp 98.5 F (36.9 C) (Oral)   Resp 18   SpO2 96%   Physical Exam  Constitutional: She is oriented to person, place, and time. She appears well-developed and well-nourished.  HENT:  Head: Normocephalic and atraumatic.  Right Ear: External ear normal.  Left Ear: External ear normal.  Nose: Nose normal.  Eyes: Pupils are equal, round, and reactive to light. Conjunctivae and EOM are normal.  Neck: Normal range of motion. Neck supple.  Cardiovascular: An irregularly irregular rhythm present. Tachycardia  present.   Pulmonary/Chest: Effort normal and breath sounds normal.  Abdominal: Soft. Bowel sounds are normal.  Musculoskeletal: Normal range of motion.  Neurological: She is alert and oriented to person, place, and time.  Skin: Skin is warm.    Psychiatric: She has a normal mood and affect. Her behavior is normal. Judgment and thought content normal.  Nursing note and vitals reviewed.    ED Treatments / Results  Labs (all labs ordered are listed, but only abnormal results are displayed) Labs Reviewed  COMPREHENSIVE METABOLIC PANEL - Abnormal; Notable for the following:       Result Value   Glucose, Bld 154 (*)    All other components within normal limits  CBC WITH DIFFERENTIAL/PLATELET - Abnormal; Notable for the following:    RDW 16.7 (*)    Platelets 149 (*)    Neutro Abs 7.8 (*)    All other components within normal limits  PROTIME-INR - Abnormal; Notable for the following:    Prothrombin Time 21.3 (*)    All other components within normal limits  MAGNESIUM - Abnormal; Notable for the following:    Magnesium 1.0 (*)    All other components within normal limits  GASTROINTESTINAL PANEL BY PCR, STOOL (REPLACES STOOL CULTURE)  C DIFFICILE QUICK SCREEN W PCR REFLEX  TROPONIN I  URINALYSIS, ROUTINE W REFLEX MICROSCOPIC    EKG  EKG Interpretation  Date/Time:  Wednesday September 11 2016 21:57:11 EDT Ventricular Rate:  128 PR Interval:    QRS Duration: 85 QT Interval:  299 QTC Calculation: 437 R Axis:   100 Text Interpretation:  Atrial fibrillation Right axis deviation Low voltage, precordial leads Repol abnrm suggests ischemia, diffuse leads No significant change since last tracing Confirmed by Isla Pence [phone]) on 09/11/2016 10:14:36 PM       Radiology No results found.  Procedures Procedures (including critical care time)  Medications Ordered in ED Medications  sodium chloride 0.9 % bolus 1,000 mL (not administered)  morphine 2 MG/ML injection 4 mg (not administered)  nebivolol (BYSTOLIC) tablet 10 mg (not administered)  magnesium sulfate IVPB 2 g 50 mL (not administered)  sodium chloride 0.9 % bolus 1,000 mL (1,000 mLs Intravenous New Bag/Given 09/11/16 2206)  ondansetron (ZOFRAN) injection 4 mg  (4 mg Intravenous Given 09/11/16 2206)  morphine 2 MG/ML injection 4 mg (4 mg Intravenous Given 09/11/16 2206)     Initial Impression / Assessment and Plan / ED Course  I have reviewed the triage vital signs and the nursing notes.  Pertinent labs & imaging results that were available during my care of the patient were reviewed by me and considered in my medical decision making (see chart for details).    Pt is feeling better.  HR is still fast, but pt did not take her evening bystolic.  As her vomiting has improved, she was given her bystolic here.  Pt will be signed out to Dr. Dina Rich pending UA and Ct abd/pelvis results.  Pt was given magnesium IV for her low Mg level.  Final Clinical Impressions(s) / ED Diagnoses   Final diagnoses:  Dehydration  Atrial fibrillation with rapid ventricular response (HCC)  Nausea vomiting and diarrhea  Hypomagnesemia    New Prescriptions New Prescriptions   No medications on file     Isla Pence, MD 09/11/16 2349

## 2016-09-12 ENCOUNTER — Telehealth: Payer: Self-pay | Admitting: Cardiology

## 2016-09-12 ENCOUNTER — Emergency Department (HOSPITAL_COMMUNITY): Payer: Medicare Other

## 2016-09-12 DIAGNOSIS — I517 Cardiomegaly: Secondary | ICD-10-CM | POA: Diagnosis not present

## 2016-09-12 DIAGNOSIS — R197 Diarrhea, unspecified: Secondary | ICD-10-CM | POA: Diagnosis not present

## 2016-09-12 DIAGNOSIS — K5730 Diverticulosis of large intestine without perforation or abscess without bleeding: Secondary | ICD-10-CM | POA: Diagnosis not present

## 2016-09-12 DIAGNOSIS — E860 Dehydration: Secondary | ICD-10-CM | POA: Diagnosis not present

## 2016-09-12 DIAGNOSIS — R1110 Vomiting, unspecified: Secondary | ICD-10-CM | POA: Diagnosis not present

## 2016-09-12 LAB — URINALYSIS, ROUTINE W REFLEX MICROSCOPIC
Bacteria, UA: NONE SEEN
Bilirubin Urine: NEGATIVE
Glucose, UA: NEGATIVE mg/dL
Hgb urine dipstick: NEGATIVE
Ketones, ur: NEGATIVE mg/dL
Leukocytes, UA: NEGATIVE
Nitrite: NEGATIVE
PH: 5 (ref 5.0–8.0)
Protein, ur: 100 mg/dL — AB
SPECIFIC GRAVITY, URINE: 1.025 (ref 1.005–1.030)

## 2016-09-12 MED ORDER — IOPAMIDOL (ISOVUE-300) INJECTION 61%
INTRAVENOUS | Status: DC
Start: 2016-09-12 — End: 2016-09-12
  Filled 2016-09-12: qty 100

## 2016-09-12 MED ORDER — ONDANSETRON 4 MG PO TBDP: 4.0000 mg | ORAL_TABLET | Freq: Three times a day (TID) | ORAL | 0 refills | Status: DC | PRN

## 2016-09-12 MED ORDER — IOPAMIDOL (ISOVUE-300) INJECTION 61%
100.0000 mL | Freq: Once | INTRAVENOUS | Status: AC | PRN
  Administered 2016-09-12: 100 mL via INTRAVENOUS

## 2016-09-12 NOTE — Telephone Encounter (Signed)
New Message  Pt call requesting to speak with Coumadin. Pt states she was in the ER and did not take coumadin. Pt would like to know if today does she need to take her coumadin regular or does it need to be adjusted. Please call back to discuss

## 2016-09-12 NOTE — Telephone Encounter (Signed)
Patient had severe diarrhea and vomiting recently that caused severe dehydration.  INR done @ ED last night was 1.82.  She is tolerating fluids so far and expect to advance diet in 24-48 hours.  Instructed to continue warfarin without changes for now and call back if unable to advance diet.

## 2016-09-12 NOTE — ED Notes (Signed)
Pt states that she is unable to pee or have a bowel movement at this time.  States she will let us know.

## 2016-09-12 NOTE — ED Provider Notes (Signed)
Patient signed out pending CT scan.  CT scan shows "diarrheal state."  Question small transition point; however, patient is not having significant obstructive symptoms. She states that she feels much better. She also incidentally noted to have a right adrenal nodule. On recheck, patient states she feels much better. She is laying flat and O2 sats are 87% on room air. Patient reports that she does require C Pap at night and sometimes has low oxygen with laying flat.  I discussed with her the results of her CT scan. She wishes to try to go home if possible. Her fluids are continuing to run. Pulmonary exam is limited secondary to body habitus and: However, no significant crackles noted on exam. Will add a chest x-ray to ensure no pulmonary edema following fluids. Last EF in 2015 was 50-55%. She has not had any diarrhea to provide a stool sample while here in the ER.  4:13 AM Patient reports continued improvement of symptoms. She is nontoxic-appearing. Heart rate now in the 80s. Her chest x-ray does not show overt volume overload. She reports when she lays flat she is known to have some hypoxia.  She has yet to urinate. Bladder scan was greater than 280 in the bladder. Patient opted for in and out cath which revealed 450 in the bladder. We'll send urine but will not hold patient and she wishes to be discharged. Patient was advised of her adrenal adenoma and will need follow-up imaging. She and her husband were also advised of the narrowing of her intestine that could for her risk for obstruction. She does not have evidence of obstruction at this time.  After history, exam, and medical workup I feel the patient has been appropriately medically screened and is safe for discharge home. Pertinent diagnoses were discussed with the patient. Patient was given return precautions.    Merryl Hacker, MD 09/12/16 [phone]

## 2016-09-12 NOTE — Discharge Instructions (Signed)
You were seen today for vomiting and diarrhea. Your workup is largely reassuring. You did have low magnesium. Your CT scan mostly concerning for diarrhea. You have a small area on her CT scan that shows a narrowing of your colon. This may make your risk for an obstruction. If you develop recurrent vomiting and are unable to tolerate fluids, you need to be reevaluated immediately. You also were noted to have an adrenal adenoma. You need to have follow-up imaging for this evaluation. Contact her primary physician to order outpatient imaging.

## 2016-09-18 DIAGNOSIS — J90 Pleural effusion, not elsewhere classified: Secondary | ICD-10-CM | POA: Diagnosis not present

## 2016-09-18 DIAGNOSIS — I4891 Unspecified atrial fibrillation: Secondary | ICD-10-CM | POA: Diagnosis not present

## 2016-09-18 DIAGNOSIS — E279 Disorder of adrenal gland, unspecified: Secondary | ICD-10-CM | POA: Diagnosis not present

## 2016-09-18 DIAGNOSIS — K869 Disease of pancreas, unspecified: Secondary | ICD-10-CM | POA: Diagnosis not present

## 2016-09-19 ENCOUNTER — Other Ambulatory Visit: Payer: Self-pay | Admitting: Internal Medicine

## 2016-09-19 DIAGNOSIS — E278 Other specified disorders of adrenal gland: Secondary | ICD-10-CM

## 2016-09-19 DIAGNOSIS — J90 Pleural effusion, not elsewhere classified: Secondary | ICD-10-CM

## 2016-09-19 DIAGNOSIS — K8689 Other specified diseases of pancreas: Secondary | ICD-10-CM

## 2016-09-19 DIAGNOSIS — E279 Disorder of adrenal gland, unspecified: Principal | ICD-10-CM

## 2016-09-25 ENCOUNTER — Encounter: Payer: Self-pay | Admitting: Podiatry

## 2016-09-25 ENCOUNTER — Ambulatory Visit (INDEPENDENT_AMBULATORY_CARE_PROVIDER_SITE_OTHER): Payer: Medicare Other | Admitting: Podiatry

## 2016-09-25 DIAGNOSIS — G609 Hereditary and idiopathic neuropathy, unspecified: Secondary | ICD-10-CM | POA: Diagnosis not present

## 2016-09-25 DIAGNOSIS — L84 Corns and callosities: Secondary | ICD-10-CM

## 2016-09-25 DIAGNOSIS — Q828 Other specified congenital malformations of skin: Secondary | ICD-10-CM

## 2016-09-25 NOTE — Progress Notes (Signed)
Patient ID: [name], female   DOB: May 08, 1934, 82 y.o.   MRN: [id number]    Subjective: Patient presents for ongoing care for pre-ulcerative in ulcerative plantar lesion on the plantar first MPJ.The patient's husband is present in the treatment room today  Objective: Orientated 3 DP and PT pulses 2/4 bilaterally Capillary reflex immediate bilaterally Sensation to 10 g monofilament wire intact 0/5 bilaterally Vibratory sensation nonreactive bilaterally Ankle reflex equal and reactive bilaterally Hemorrhagic callus plantar left first MPJ after debridement remains closed There is no surrounding erythema, edema, warmth Manual motor testing dorsi flexion,.plantar flexion, inversion, eversion 5/5 bilaterally  Assessment: Peripheral neuropathy Pre-ulcerative plantar callus first MPJ left  Plan: Debrided pre-ulcerative plantar callus left Apply protective pad around plantar callus Wear prescription shoe orthotic in athletic style shoe Limit standing walking  Reappoint 5 weeks

## 2016-10-03 ENCOUNTER — Other Ambulatory Visit: Payer: Medicare Other

## 2016-10-07 ENCOUNTER — Other Ambulatory Visit: Payer: Self-pay | Admitting: Cardiology

## 2016-10-08 ENCOUNTER — Other Ambulatory Visit: Payer: Self-pay | Admitting: Pharmacist Clinician (PhC)/ Clinical Pharmacy Specialist

## 2016-10-08 ENCOUNTER — Ambulatory Visit
Admission: RE | Admit: 2016-10-08 | Discharge: 2016-10-08 | Disposition: A | Discharge status: Home / Self Care | Payer: Medicare Other | Source: Ambulatory Visit | Attending: Internal Medicine | Admitting: Internal Medicine

## 2016-10-08 ENCOUNTER — Ambulatory Visit
Admission: RE | Admit: 2016-10-08 | Discharge: 2016-10-08 | Disposition: A | Discharge status: Home / Self Care | Payer: PRIVATE HEALTH INSURANCE | Source: Ambulatory Visit | Attending: Internal Medicine | Admitting: Internal Medicine

## 2016-10-08 ENCOUNTER — Other Ambulatory Visit: Payer: Medicare Other

## 2016-10-08 DIAGNOSIS — K8689 Other specified diseases of pancreas: Secondary | ICD-10-CM

## 2016-10-08 DIAGNOSIS — K5730 Diverticulosis of large intestine without perforation or abscess without bleeding: Secondary | ICD-10-CM | POA: Diagnosis not present

## 2016-10-08 DIAGNOSIS — E278 Other specified disorders of adrenal gland: Secondary | ICD-10-CM

## 2016-10-08 DIAGNOSIS — J90 Pleural effusion, not elsewhere classified: Secondary | ICD-10-CM

## 2016-10-08 DIAGNOSIS — J810 Acute pulmonary edema: Secondary | ICD-10-CM | POA: Diagnosis not present

## 2016-10-08 DIAGNOSIS — E279 Disorder of adrenal gland, unspecified: Principal | ICD-10-CM

## 2016-10-08 MED ORDER — GADOBENATE DIMEGLUMINE 529 MG/ML IV SOLN
20.0000 mL | Freq: Once | INTRAVENOUS | Status: AC | PRN
  Administered 2016-10-08: 20 mL via INTRAVENOUS

## 2016-10-08 MED ORDER — WARFARIN SODIUM 2.5 MG PO TABS: ORAL_TABLET | ORAL | 0 refills | Status: DC

## 2016-10-09 DIAGNOSIS — R112 Nausea with vomiting, unspecified: Secondary | ICD-10-CM | POA: Diagnosis not present

## 2016-10-10 DIAGNOSIS — R112 Nausea with vomiting, unspecified: Secondary | ICD-10-CM | POA: Diagnosis not present

## 2016-10-10 DIAGNOSIS — R05 Cough: Secondary | ICD-10-CM | POA: Diagnosis not present

## 2016-10-10 DIAGNOSIS — R490 Dysphonia: Secondary | ICD-10-CM | POA: Diagnosis not present

## 2016-10-10 DIAGNOSIS — R1310 Dysphagia, unspecified: Secondary | ICD-10-CM | POA: Diagnosis not present

## 2016-10-11 DIAGNOSIS — H401113 Primary open-angle glaucoma, right eye, severe stage: Secondary | ICD-10-CM | POA: Diagnosis not present

## 2016-10-11 DIAGNOSIS — Z961 Presence of intraocular lens: Secondary | ICD-10-CM | POA: Diagnosis not present

## 2016-10-13 ENCOUNTER — Other Ambulatory Visit: Payer: Medicare Other

## 2016-10-15 ENCOUNTER — Ambulatory Visit (INDEPENDENT_AMBULATORY_CARE_PROVIDER_SITE_OTHER): Payer: Medicare Other | Admitting: Pharmacist Clinician (PhC)/ Clinical Pharmacy Specialist

## 2016-10-15 DIAGNOSIS — K219 Gastro-esophageal reflux disease without esophagitis: Secondary | ICD-10-CM | POA: Diagnosis not present

## 2016-10-15 DIAGNOSIS — I482 Chronic atrial fibrillation, unspecified: Secondary | ICD-10-CM

## 2016-10-15 DIAGNOSIS — R1310 Dysphagia, unspecified: Secondary | ICD-10-CM | POA: Insufficient documentation

## 2016-10-15 DIAGNOSIS — I4891 Unspecified atrial fibrillation: Secondary | ICD-10-CM | POA: Diagnosis not present

## 2016-10-15 DIAGNOSIS — R1319 Other dysphagia: Secondary | ICD-10-CM | POA: Insufficient documentation

## 2016-10-15 DIAGNOSIS — J302 Other seasonal allergic rhinitis: Secondary | ICD-10-CM | POA: Insufficient documentation

## 2016-10-15 LAB — POCT INR: INR: 2.8

## 2016-10-17 DIAGNOSIS — R490 Dysphonia: Secondary | ICD-10-CM | POA: Diagnosis not present

## 2016-10-17 DIAGNOSIS — Z79899 Other long term (current) drug therapy: Secondary | ICD-10-CM | POA: Diagnosis not present

## 2016-10-17 DIAGNOSIS — I10 Essential (primary) hypertension: Secondary | ICD-10-CM | POA: Diagnosis not present

## 2016-10-17 DIAGNOSIS — Z5181 Encounter for therapeutic drug level monitoring: Secondary | ICD-10-CM | POA: Diagnosis not present

## 2016-10-24 ENCOUNTER — Telehealth: Payer: Self-pay | Admitting: Cardiology

## 2016-10-24 NOTE — Telephone Encounter (Signed)
New Message ° ° pt verbalized that she is returning call for rn °

## 2016-10-24 NOTE — Telephone Encounter (Signed)
New Message  Pt call requesting to speak with RN about f/u on dental clearance. Please call back to discuss

## 2016-10-24 NOTE — Telephone Encounter (Signed)
Returned call to patient.[place].

## 2016-10-24 NOTE — Telephone Encounter (Signed)
Returned call to patient she stated she might be having 1 tooth pulled next week she wanted to know if she needed to hold coumadin.Advised she does not have to hold coumadin for 1 tooth extraction.

## 2016-10-28 ENCOUNTER — Ambulatory Visit: Payer: Medicare Other | Admitting: Podiatry

## 2016-11-12 DIAGNOSIS — R198 Other specified symptoms and signs involving the digestive system and abdomen: Secondary | ICD-10-CM | POA: Diagnosis not present

## 2016-11-12 DIAGNOSIS — K219 Gastro-esophageal reflux disease without esophagitis: Secondary | ICD-10-CM | POA: Diagnosis not present

## 2016-11-12 DIAGNOSIS — K5000 Crohn's disease of small intestine without complications: Secondary | ICD-10-CM | POA: Diagnosis not present

## 2016-11-18 ENCOUNTER — Encounter: Payer: Self-pay | Admitting: Cardiology

## 2016-11-22 NOTE — Progress Notes (Signed)
Cardiology Office Note   Date:  11/26/2016   ID:  [name], DOB December 17, 1934, MRN [id number]  PCP:  Deland Pretty, MD  Cardiologist: Peter Martinique MD  Chief Complaint  Patient presents with  . Follow-up    6 months.  . Edema    Some.  [name] Kitchen Shortness of Breath      History of Present Illness: [name] is a 82 y.o. female who presents for follow up CHF and atrial fibrillation. She is a former patient of Dr. Mare Ferrari.   She was admitted to the hospital in February 2015 because of new onset atrial fibrillation. She was already on long-term Coumadin. Diltiazem for rate control was added to her regimen in the hospital. The patient has a past history of pulmonary emboli and is on long-term Coumadin. She has a history of essential hypertension and a history of right carotid artery occlusion. 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 Donnetta Hutching. Last doppler in March 2018 was unchanged. She does not have any significant coronary disease and she had cardiac catheterization in 1994 showing only mild plaque and she had a nuclear stress test in December 2011 showing no ischemia and her ejection fraction was 78%.   On Her last visit with me she complained of marked fatigue so we discontinued her Lozol without much change. She was maintained on Bystolic and prn lasix.   On follow up today she is doing well from a cardiac standpoint. She was without power for 4 days and really missed her CPAP. Couldn't lie down at night and had some increased swelling. Now back to baseline. She is taking lasix and lozol daily now. Wears support hose. Reports weight is stable. She still has a lot of pain in left foot and ankle. Has generalized pain from fibromyalgia. Was seen in ED in August with N/V and diarrhea. Had CT and MRI done. Followed up with Dr. Cristina Gong. Symptoms resolved. She had a recent dental extraction.     Past Medical  History:  Diagnosis Date  . Acid reflux disease   . Atrial fibrillation ([place])   . Carotid artery occlusion   . Chronic diastolic CHF (congestive heart failure) (HCC)    Hypertensive heart disease 02-12-14  . Crohn disease ([place])   . Crohn's disease ([place])   . Detached retina   . Fibromyalgia   . Fibromyalgia   . GERD (gastroesophageal reflux disease)   . Gout   . H/O hiatal hernia   . High triglycerides   . History of stomach ulcers 1950's  . Hypertension   . Long term current use of anticoagulant   . Obstructive sleep apnea on CPAP   . Osteoarthritis    PAIN AND OA LEF T HIP AND BOTH SHOULDERS ARE BONE ON BONE AND PAINFUL  . Peripheral vascular disease (HCC)    KNOWN RIGHT INTERNAL CAROTID ARTERY OCCLUSION (NO STROKE)  --40 TO 59% STENOSIS LEFT ICA-FOLLOWED BY DR. EARLY WITH DOPPLER STUDY EVERY 6 MONTHS  . Pulmonary embolism ([place]) 2011   a. Hx of PE in 02/2009 after R hip surgery, venous dopplers negative, long-term Coumadin.  [name] Kitchen PVC (premature ventricular contraction)    PT STATES HX OF PVC'S ON EKG  . Recurrent upper respiratory infection (URI)    BRONCHITIS FEB 2013--SLIGHT COUGH NON-PRODUCTIVE NOW  . Recurrent UTI (urinary tract infection)    "on daily medicine" (05/14/2012)  .  Tinnitus   . Vertigo     Past Surgical History:  Procedure Laterality Date  . APPENDECTOMY  1950's  . BACK SURGERY  10/29/06   Central and foraminal decompression L3-L4, L4-L5, and L5-S1 with inspection of L4-L5 and L5-S1 disc on the right  . CARDIAC CATHETERIZATION  1970's   "maybe 2" (05/14/2012)  . CARDIAC CATHETERIZATION    . CATARACT EXTRACTION W/ INTRAOCULAR LENS IMPLANT Right 2009  . CHOLECYSTECTOMY  ~ 1988  . DECOMPRESSIVE LUMBAR LAMINECTOMY LEVEL 3  ~ 2002  . DILATION AND CURETTAGE OF UTERUS     "several; from miscarriages" (05/14/2012)  . EXCISION MORTON'S NEUROMA Right 05/20/00   "foot" (05/14/2012)  . EYE SURGERY Left 2014   Detached retina  . EYE SURGERY Left August 23, 2013   Cataract  .  FEMUR FRACTURE SURGERY Right 02/21/09   Open reduction internal fixation of right periprosthetic  femur fracture utilizing Zimmer cables times fiv  . FRACTURE SURGERY  2011   Right Femur Fx  . HAMMER TOE SURGERY Left 10/25/08   "toe next to big to" (05/14/2012)  . JOINT REPLACEMENT  2009   Right Hip replacement  . JOINT REPLACEMENT  2012   Right knee replacement  . JOINT REPLACEMENT  06/17/11   Left Hip replacement  . KNEE ARTHROSCOPY Right 07/26/05  . REPLACEMENT TOTAL KNEE Right 02/19/2010  . SPINE SURGERY    . [place]?  . TOTAL HIP ARTHROPLASTY Right 12/08/02   Osteonics total hip replacement  . TOTAL HIP ARTHROPLASTY  06/17/2011   Procedure: TOTAL HIP ARTHROPLASTY;  Surgeon: Gearlean Alf, MD;  Location: [facility];  Service: Orthopedics;  Laterality: Left;  . TOTAL SHOULDER ARTHROPLASTY Left 05/14/2012  . TOTAL SHOULDER ARTHROPLASTY Left 05/14/2012   Procedure: TOTAL SHOULDER ARTHROPLASTY;  Surgeon: Marin Shutter, MD;  Location: [facility];  Service: Orthopedics;  Laterality: Left;  [name] Kitchen VAGINAL HYSTERECTOMY  1971     Current Outpatient Prescriptions  Medication Sig Dispense Refill  . allopurinol (ZYLOPRIM) 300 MG tablet Take 300 mg by mouth daily.    [name] Kitchen amLODipine (NORVASC) 2.5 MG tablet Take 2.5 mg by mouth daily.    . cetirizine (ZYRTEC) 10 MG tablet Take 10 mg by mouth daily.    . clobetasol cream (TEMOVATE) 0.27 % Apply 1 application topically 2 (two) times daily.    [name] Kitchen CRANBERRY CONCENTRATE PO Take 1 tablet by mouth daily.    . dorzolamide-timolol (COSOPT) 22.3-6.8 MG/ML ophthalmic solution Place 1 drop into both eyes 2 (two) times daily.     . furosemide (LASIX) 20 MG tablet Take 0.5 tablets (10 mg total) by mouth as directed. Mondays, Wednesdays, Fridays 30 tablet 6  . HYDROcodone-acetaminophen (NORCO) 10-325 MG per tablet Take 1 tablet by mouth every 6 (six) hours as needed (pain).     . indapamide (LOZOL) 2.5 MG tablet Take 2.5 mg by mouth daily.    [name] Kitchen  ketoconazole (NIZORAL) 2 % cream Apply 1 application topically 2 (two) times daily as needed for irritation.     [name] Kitchen LUMIGAN 0.01 % SOLN Place 1 drop into both eyes at bedtime.    . nebivolol (BYSTOLIC) 10 MG tablet Take 1 tablet (10 mg total) by mouth at bedtime. 90 tablet 3  . omeprazole (PRILOSEC) 40 MG capsule Take 40 mg by mouth daily.    . ondansetron (ZOFRAN ODT) 4 MG disintegrating tablet Take 1 tablet (4 mg total) by mouth every 8 (eight) hours as needed for nausea or  vomiting. 20 tablet 0  . potassium chloride (K-DUR,KLOR-CON) 10 MEQ tablet Take 10 mEq by mouth 2 (two) times daily.    . silver sulfADIAZINE (SILVADENE) 1 % cream Apply 1 application topically daily. 50 g 0  . traZODone (DESYREL) 50 MG tablet Take 50 mg by mouth at bedtime.    . triamcinolone (NASACORT ALLERGY 24HR) 55 MCG/ACT AERO nasal inhaler Place 2 sprays into the nose daily as needed.    . warfarin (COUMADIN) 2.5 MG tablet TAKE 1 TO 1&1/2 TABLETS DAILY AS DIRECTED BY COUMADIN CLINIC. 120 tablet 0   No current facility-administered medications for this visit.     Allergies:   Codeine; Cymbalta [duloxetine hcl]; Diltiazem cd [diltiazem hcl er beads]; Metoprolol; Nortriptyline; Penicillins; Statins; Tetanus toxoid adsorbed; Tetanus toxoids; Ciprofloxacin; Lyrica [pregabalin]; and Penicillin g    Social History:  The patient  reports that she quit smoking about 30 years ago. Her smoking use included Cigarettes. She has a 7.50 pack-year smoking history. She has never used smokeless tobacco. She reports that she drinks alcohol. She reports that she does not use drugs.   Family History:  The patient's family history includes AAA (abdominal aortic aneurysm) in her mother; Aneurysm in her mother; Diabetes in her paternal grandfather and son; Heart attack in her brother; Heart attack (age of onset: 42) in her father; Heart disease in her brother, father, and mother; Hyperlipidemia in her brother; Hypertension in her brother,  brother, father, and mother; Peripheral vascular disease in her father; Stroke in her mother.    ROS:  Please see the history of present illness.   Otherwise, review of systems are positive for none.   All other systems are reviewed and negative.    PHYSICAL EXAM: VS:  BP 116/82   Pulse 84   Ht 5' 2"  (1.575 m)   Wt 223 lb (101.2 kg)   BMI 40.79 kg/m  , BMI Body mass index is 40.79 kg/m. GENERAL:  Well appearing, morbidly obese WF in NAD HEENT:  PERRL, EOMI, sclera are clear. Oropharynx is clear. NECK:  No jugular venous distention, carotid upstroke brisk and symmetric, no bruits, no thyromegaly or adenopathy LUNGS:  Clear to auscultation bilaterally CHEST:  Unremarkable HEART:  IRRR,  PMI not displaced or sustained,S1 and S2 within normal limits, no S3, no S4: no clicks, no rubs, no murmurs ABD:  Soft, nontender. BS +, no masses or bruits. No hepatomegaly, no splenomegaly EXT:  2 + pulses throughout, no edema- wearing support hose., no cyanosis no clubbing SKIN:  Warm and dry.  No rashes NEURO:  Alert and oriented x 3. Cranial nerves II through XII intact. PSYCH:  Cognitively intact     EKG:  EKG is not ordered today.    Recent Labs: Labs dated 05/01/15: cholesterol 153, triglycerides 384, HDL 34, LDL 42. Glucose 150. CMET and CBC otherwise normal. Dated 05/15/16: cholesterol 129, triglycerides 270, HDL 33, LDL 42. A1c 5.9%.  Dated 10/17/16: normal CBC. Creatinine 1.1. Other chemistries normal.     Wt Readings from Last 3 Encounters:  11/26/16 223 lb (101.2 kg)  05/27/16 227 lb 9.6 oz (103.2 kg)  04/30/16 229 lb 8 oz (104.1 kg)        ASSESSMENT AND PLAN:  1. Permanent atrial fibrillation on long-term anticoagulation. She is asymptomatic with good rate control. On long term anticoagulation with Coumadin. 2. History of pulmonary emboli, on warfarin 3. Essential hypertension with intolerance to metoprolol. Currently well controlled.  4. Chronic diastolic CHF. She is  taking lasix 10 mg daily now with good volume control.   Sodium restriction.  5. Morbid obesity. Encourage weight loss with carb restriction.    Current medicines are reviewed at length with the patient today.  The patient does not have concerns regarding medicines.  The following changes have been made:  no change  Labs/ tests ordered today include:  No orders of the defined types were placed in this encounter.    Disposition: as noted above.   Follow up in 6 months.  Signed, Peter Martinique MD, [facility]    11/26/2016 10:09 AM     Phone: [phone]; Fax: [phone]

## 2016-11-25 DIAGNOSIS — N3020 Other chronic cystitis without hematuria: Secondary | ICD-10-CM | POA: Diagnosis not present

## 2016-11-25 DIAGNOSIS — N393 Stress incontinence (female) (male): Secondary | ICD-10-CM | POA: Diagnosis not present

## 2016-11-26 ENCOUNTER — Ambulatory Visit (INDEPENDENT_AMBULATORY_CARE_PROVIDER_SITE_OTHER): Payer: PRIVATE HEALTH INSURANCE | Admitting: Pharmacist

## 2016-11-26 ENCOUNTER — Encounter: Payer: Self-pay | Admitting: Cardiology

## 2016-11-26 ENCOUNTER — Ambulatory Visit (INDEPENDENT_AMBULATORY_CARE_PROVIDER_SITE_OTHER): Payer: Medicare Other | Admitting: Cardiology

## 2016-11-26 VITALS — BP 116/82 | HR 84 | Ht 62.0 in | Wt 223.0 lb

## 2016-11-26 DIAGNOSIS — I4891 Unspecified atrial fibrillation: Secondary | ICD-10-CM | POA: Diagnosis not present

## 2016-11-26 DIAGNOSIS — I482 Chronic atrial fibrillation, unspecified: Secondary | ICD-10-CM

## 2016-11-26 DIAGNOSIS — I6523 Occlusion and stenosis of bilateral carotid arteries: Secondary | ICD-10-CM

## 2016-11-26 DIAGNOSIS — I5032 Chronic diastolic (congestive) heart failure: Secondary | ICD-10-CM | POA: Diagnosis not present

## 2016-11-26 LAB — POCT INR: INR: 2.1

## 2016-11-26 NOTE — Patient Instructions (Signed)
Continue your current therapy  I will see you in 6 months.   

## 2016-11-29 DIAGNOSIS — Z5181 Encounter for therapeutic drug level monitoring: Secondary | ICD-10-CM | POA: Diagnosis not present

## 2016-11-29 DIAGNOSIS — I10 Essential (primary) hypertension: Secondary | ICD-10-CM | POA: Diagnosis not present

## 2016-11-29 DIAGNOSIS — E785 Hyperlipidemia, unspecified: Secondary | ICD-10-CM | POA: Diagnosis not present

## 2016-11-29 DIAGNOSIS — Z23 Encounter for immunization: Secondary | ICD-10-CM | POA: Diagnosis not present

## 2016-12-07 ENCOUNTER — Other Ambulatory Visit: Payer: Self-pay | Admitting: Cardiology

## 2016-12-09 NOTE — Telephone Encounter (Signed)
REFILL 

## 2016-12-10 ENCOUNTER — Other Ambulatory Visit: Payer: Self-pay | Admitting: Cardiology

## 2016-12-11 ENCOUNTER — Other Ambulatory Visit: Payer: Self-pay | Admitting: Cardiology

## 2016-12-11 NOTE — Telephone Encounter (Signed)
REFILL 

## 2016-12-11 NOTE — Telephone Encounter (Signed)
Rx has been sent to the pharmacy electronically. ° °

## 2016-12-16 ENCOUNTER — Ambulatory Visit (INDEPENDENT_AMBULATORY_CARE_PROVIDER_SITE_OTHER): Payer: Medicare Other | Admitting: Podiatry

## 2016-12-16 ENCOUNTER — Encounter: Payer: Self-pay | Admitting: Podiatry

## 2016-12-16 VITALS — BP 173/101 | HR 95

## 2016-12-16 DIAGNOSIS — L84 Corns and callosities: Secondary | ICD-10-CM

## 2016-12-16 DIAGNOSIS — I10 Essential (primary) hypertension: Secondary | ICD-10-CM | POA: Diagnosis not present

## 2016-12-16 DIAGNOSIS — G609 Hereditary and idiopathic neuropathy, unspecified: Secondary | ICD-10-CM

## 2016-12-16 MED ORDER — NONFORMULARY OR COMPOUNDED ITEM: 0 refills | Status: DC

## 2016-12-16 NOTE — Patient Instructions (Signed)
Purchase PRE wrap prewrap Purchase 2 inch sports tape Apply the 2 inch tape over prewrap on your left midfoot daily We'll contact the compound pharmacy to apply topical medication for pain and neuropathy Apply this only to the areas that uncomfortable 3-4 times a day

## 2016-12-16 NOTE — Progress Notes (Signed)
Patient ID: [name], female   DOB: 04/07/1934, 82 y.o.   MRN: [id number]    Subjective: Patient presents for ongoing care for pre-ulcerative in ulcerative plantar lesion on the plantar first MPJ. Also, patient is complaining of left mid foot pain increases with standing walking relieved somewhat with rest and elevation. Patient has a history of degenerative midfoot osteoarthritisThe patient's husband is present in the treatment room today  Objective: Orientated 3 DP and PT pulses 2/4 bilaterally Capillary reflex immediate bilaterally Sensation to 10 g monofilament wire intact 0/5 bilaterally Vibratory sensation nonreactive bilaterally Ankle reflex equal and reactive bilaterally Hemorrhagic callus plantar left first MPJ after debridement remains closed There is no surrounding erythema, edema, warmth Manual motor testing dorsi flexion,.plantar flexion, inversion, eversion 5/5 bilaterally Palpable tenderness medial talar navicular cuneiform area left without palpable lesions  Assessment: Peripheral neuropathy Pre-ulcerative plantar callus first MPJ left Midfoot osteoarthropathy left Plan: Debrided pre-ulcerative plantar callus left Wear prescription shoe orthotic in athletic style shoe Suggest 2 inch tape over prewrap to support left midfoot [facility] will [facility] directly to patient with instruction apply to affected area 3-4 times daily Limit standing walking  Reappoint 5 weeks

## 2016-12-16 NOTE — Addendum Note (Signed)
Addended byDeidre Ala, Jamira Barfuss L on: 12/16/2016 05:12 PM   Modules accepted: Orders

## 2016-12-17 ENCOUNTER — Telehealth: Payer: Self-pay | Admitting: *Deleted

## 2016-12-17 NOTE — Telephone Encounter (Signed)
[place] Peripheral Neuropathy Cream with demographics.

## 2016-12-25 DIAGNOSIS — M545 Low back pain: Secondary | ICD-10-CM | POA: Diagnosis not present

## 2016-12-25 DIAGNOSIS — I509 Heart failure, unspecified: Secondary | ICD-10-CM | POA: Diagnosis not present

## 2016-12-25 DIAGNOSIS — I10 Essential (primary) hypertension: Secondary | ICD-10-CM | POA: Diagnosis not present

## 2016-12-30 ENCOUNTER — Encounter: Payer: Self-pay | Admitting: Podiatry

## 2016-12-30 ENCOUNTER — Ambulatory Visit (INDEPENDENT_AMBULATORY_CARE_PROVIDER_SITE_OTHER): Payer: Medicare Other | Admitting: Podiatry

## 2016-12-30 VITALS — BP 149/75 | HR 103

## 2016-12-30 DIAGNOSIS — I6523 Occlusion and stenosis of bilateral carotid arteries: Secondary | ICD-10-CM | POA: Diagnosis not present

## 2016-12-30 DIAGNOSIS — L02619 Cutaneous abscess of unspecified foot: Secondary | ICD-10-CM | POA: Diagnosis not present

## 2016-12-30 DIAGNOSIS — G609 Hereditary and idiopathic neuropathy, unspecified: Secondary | ICD-10-CM | POA: Diagnosis not present

## 2016-12-30 DIAGNOSIS — L03119 Cellulitis of unspecified part of limb: Secondary | ICD-10-CM

## 2016-12-30 DIAGNOSIS — L98491 Non-pressure chronic ulcer of skin of other sites limited to breakdown of skin: Secondary | ICD-10-CM

## 2016-12-30 MED ORDER — CEPHALEXIN 500 MG PO CAPS
500.0000 mg | ORAL_CAPSULE | Freq: Three times a day (TID) | ORAL | 1 refills | Status: DC
Start: 2016-12-30 — End: 2017-05-06

## 2016-12-30 NOTE — Patient Instructions (Signed)
Please wear surgical shoe on the left foot Apply the protective cut out felt pad as we discussed in the office Apply topical antibiotic ointment to the blister and ulcer daily If you notice any sudden increase in pain, swelling, redness, fever presents emergency department

## 2016-12-30 NOTE — Progress Notes (Signed)
   Subjective:    Patient ID: [name], female    DOB: October 18, 1934, 82 y.o.   MRN: [id number]  HPI Patient presents today with husband treatment complaining of a blister patient in or around the medial left first MPJ present for approximately 3 days.She says a blister was initially larger, however, appears to be smaller. Patient has apply Silvadene to the area and presents for evaluation   Review of Systems  All other systems reviewed and are negative.      Objective:   Physical Exam  Pleasant orientated 3  Vascular: No calf edema calf tenderness bilaterally DP pulses 2/4 bilaterally PT pulses 2/4 bilaterally Reflex within normal limits bilaterally  Neurological: Sensation to 10 g monofilament wire intact 0/8 bilaterally Vibratory sensation nonreactive bilaterally Ankle reflexes reactive bilaterally  Dermatological: Blister formation medial first MPJ attached to the plantar left first MPJ previous ulcerated area. There is fluctuance to palpation plantar left first MPJ. There is no active drainage or malodor The medial blisters 10 mm diameter and the plantar ulcer after debridement of hyperkeratotic tissue is 5 x 2 mm  Musculoskeletal: Dorsi flexion, plantar flexion, inversion, eversion 5/5 bilaterally       Assessment & Plan:   Assessment: Blister reaction left first MPJ Superficial ulcer plantar left first MPJ Cellulitis plantar first MPJ area  Plan: Debride blister and plantar superficial ulcer Apply protective felt pad to offload this area with a Silvadene dressing Patient will begin wearing existing surgical shoe Patient has history of tolerating cephalexin even though there is a penicillin allergy she's taking it for dental prophylaxis Rx cephalexin 500 mg dispensed 21 sig 1 by mouth 3 times a day. Patient will maintain local wound care with either topical antibiotic ointment Silvadene and attach felt pad to offload areas Instructed patient if she  noticed any sudden increase in pain, swelling, redness, fever to present to emergency department  Reappoint 7 days

## 2017-01-06 ENCOUNTER — Ambulatory Visit (INDEPENDENT_AMBULATORY_CARE_PROVIDER_SITE_OTHER): Payer: Medicare Other | Admitting: Podiatry

## 2017-01-06 ENCOUNTER — Encounter: Payer: Self-pay | Admitting: Podiatry

## 2017-01-06 DIAGNOSIS — I6523 Occlusion and stenosis of bilateral carotid arteries: Secondary | ICD-10-CM

## 2017-01-06 DIAGNOSIS — L03119 Cellulitis of unspecified part of limb: Secondary | ICD-10-CM

## 2017-01-06 DIAGNOSIS — L02619 Cutaneous abscess of unspecified foot: Secondary | ICD-10-CM | POA: Diagnosis not present

## 2017-01-06 DIAGNOSIS — M810 Age-related osteoporosis without current pathological fracture: Secondary | ICD-10-CM | POA: Diagnosis not present

## 2017-01-06 NOTE — Patient Instructions (Signed)
Okay to put some antibiotic ointment and a previous blister site on the bottom of the left foot and cover with a Band-Aid Okay to wear compression hose Okay to wear diabetic shoe with custom insole Resume antibiotic capsules by mouth 1 capsule 3 times a day 7 days  Diabetes and Foot Care Diabetes may cause you to have problems because of poor blood supply (circulation) to your feet and legs. This may cause the skin on your feet to become thinner, break easier, and heal more slowly. Your skin may become dry, and the skin may peel and crack. You may also have nerve damage in your legs and feet causing decreased feeling in them. You may not notice minor injuries to your feet that could lead to infections or more serious problems. Taking care of your feet is one of the most important things you can do for yourself. Follow these instructions at home:  Wear shoes at all times, even in the house. Do not go barefoot. Bare feet are easily injured.  Check your feet daily for blisters, cuts, and redness. If you cannot see the bottom of your feet, use a mirror or ask someone for help.  Wash your feet with warm water (do not use hot water) and mild soap. Then pat your feet and the areas between your toes until they are completely dry. Do not soak your feet as this can dry your skin.  Apply a moisturizing lotion or petroleum jelly (that does not contain alcohol and is unscented) to the skin on your feet and to dry, brittle toenails. Do not apply lotion between your toes.  Trim your toenails straight across. Do not dig under them or around the cuticle. File the edges of your nails with an emery board or nail file.  Do not cut corns or calluses or try to remove them with medicine.  Wear clean socks or stockings every day. Make sure they are not too tight. Do not wear knee-high stockings since they may decrease blood flow to your legs.  Wear shoes that fit properly and have enough cushioning. To break in new  shoes, wear them for just a few hours a day. This prevents you from injuring your feet. Always look in your shoes before you put them on to be sure there are no objects inside.  Do not cross your legs. This may decrease the blood flow to your feet.  If you find a minor scrape, cut, or break in the skin on your feet, keep it and the skin around it clean and dry. These areas may be cleansed with mild soap and water. Do not cleanse the area with peroxide, alcohol, or iodine.  When you remove an adhesive bandage, be sure not to damage the skin around it.  If you have a wound, look at it several times a day to make sure it is healing.  Do not use heating pads or hot water bottles. They may burn your skin. If you have lost feeling in your feet or legs, you may not know it is happening until it is too late.  Make sure your health care provider performs a complete foot exam at least annually or more often if you have foot problems. Report any cuts, sores, or bruises to your health care provider immediately. Contact a health care provider if:  You have an injury that is not healing.  You have cuts or breaks in the skin.  You have an ingrown nail.  You notice  redness on your legs or feet.  You feel burning or tingling in your legs or feet.  You have pain or cramps in your legs and feet.  Your legs or feet are numb.  Your feet always feel cold. Get help right away if:  There is increasing redness, swelling, or pain in or around a wound.  There is a red line that goes up your leg.  Pus is coming from a wound.  You develop a fever or as directed by your health care provider.  You notice a bad smell coming from an ulcer or wound. This information is not intended to replace advice given to you by your health care provider. Make sure you discuss any questions you have with your health care provider. Document Released: 01/26/2000 Document Revised: 07/06/2015 Document Reviewed:  07/07/2012 Elsevier Interactive Patient Education  2017 [employer].

## 2017-01-07 ENCOUNTER — Encounter: Payer: Self-pay | Admitting: Podiatry

## 2017-01-07 ENCOUNTER — Ambulatory Visit (INDEPENDENT_AMBULATORY_CARE_PROVIDER_SITE_OTHER): Payer: Medicare Other | Admitting: Pharmacist

## 2017-01-07 DIAGNOSIS — Z7901 Long term (current) use of anticoagulants: Secondary | ICD-10-CM | POA: Diagnosis not present

## 2017-01-07 DIAGNOSIS — I4891 Unspecified atrial fibrillation: Secondary | ICD-10-CM | POA: Diagnosis not present

## 2017-01-07 LAB — POCT INR: INR: 2

## 2017-01-07 NOTE — Progress Notes (Signed)
   Subjective:    Patient ID: [name], female    DOB: 08-27-1934, 82 y.o.   MRN: [id number]  HPI This patient presents today for follow-up visit of 12/30/2013. At that time a blister reaction with a superficial ulcer/cellulitis in the plantar left first MPJ was diagnosed. Cephalexin 500 mg by mouth 3 times a day was prescribed as well as local dressings with Silvadene dressings and instructions to wear surgical shoe. Patient has complied with this request. She has completed 5 of the 7 days of the cephalexin without any complaint. Today upon examination of the left foot a local erythema and edema was noted, however, patient is unaware as to the onset of this change.   Review of Systems Other systems reviewed and are negative    Objective:   Physical Exam  Patient's husband present in the treatment room today  Objective: Orientated 3  Vascular: PT pulses 2/4 bilaterally DP pulses 2/4 bilaterally Capillary reflex within normal limits bilaterally No calf edema calf tenderness bilaterally  Neurological: Sensation to 10 g monofilament wire intact 0/8 bilaterally Vibratory sensation nonreactive bilaterally Ankle reflexes reactive bilaterally  Dermatological: Low-grade erythema, edema and warmth dorsal aspect of left foot without open lesion Plantar medial first MPJ left as callused area with some residual dried blood within the area. There is no surrounding erythema, edema, warmth or drainage from this area. There is no extension of erythema, or communicating channels to the dorsal erythema and edema  Musculoskeletal: No restriction or pain upon range of motion ankle, subtalar, midtarsal joints       Assessment & Plan:   Assessment: Sensory neuropathy Resolved blister/cellulitis and ulceration plantar left first MPJ Cellulitis dorsal left foot  Plan: Resume cephalexin 500 mg by mouth 3 times a day 7 days DC surgical shoe Wear diabetic shoes with custom  insoles Resume compression hose Patient instructed if she knows any sudden increase in pain, swelling, redness, fever to present to the emergency department  Reappoint 7 days

## 2017-01-13 ENCOUNTER — Ambulatory Visit (INDEPENDENT_AMBULATORY_CARE_PROVIDER_SITE_OTHER): Payer: Medicare Other | Admitting: Podiatry

## 2017-01-13 ENCOUNTER — Encounter: Payer: Self-pay | Admitting: Podiatry

## 2017-01-13 VITALS — BP 142/81 | HR 102

## 2017-01-13 DIAGNOSIS — L02619 Cutaneous abscess of unspecified foot: Secondary | ICD-10-CM | POA: Diagnosis not present

## 2017-01-13 DIAGNOSIS — I6523 Occlusion and stenosis of bilateral carotid arteries: Secondary | ICD-10-CM

## 2017-01-13 DIAGNOSIS — L03119 Cellulitis of unspecified part of limb: Secondary | ICD-10-CM | POA: Diagnosis not present

## 2017-01-13 NOTE — Progress Notes (Signed)
Patient ID: [name], female   DOB: 05-04-1934, 82 y.o.   MRN: [id number]   This patient presents today for follow-up visit of 12/30/2013. At that time a blister reaction with a superficial ulcer/cellulitis in the plantar left first MPJ was diagnosed. Cephalexin 500 mg by mouth 3 times a day was prescribed as well as local dressings with Silvadene dressings and instructions to wear surgical shoe. Patient has complied with this request. She has completed 5 of the 7 days of the cephalexin without any complaint. Today upon examination of the left foot a local erythema and edema was noted, however, patient is unaware as to the onset of this change, notice on the visit of 01/06/2017. Patient is maintain cephalexin 500 mg by mouth 3 times a day with minimal complaint from medication  Objective: Orientated 3 DP and PT pulses 2/4 bilaterally Capillary reflex within malodor normal limits bilaterally Sensation to 10 g monofilament wire intact 0/8 bilaterally Vibratory sensation nonreactive bilaterally Ankle reflexes reactive bilaterally No open skin lesions bilaterally Dorsal aspect left foot demonstrates scaling skin without erythema, edema, warmth Medial plantar left first MPJ has reactive callus that remains closed  Assessment: Resolved cellulitis dorsal aspect left foot Pre-ulcerative callus plantar left first MPJ  Plan: DC cephalexin Apply Vaseline and Band-Aid to scaling skin of pre-ulcerative callus Wear athletic style shoe with custom insoles  Reappoint 5 weeks

## 2017-01-13 NOTE — Patient Instructions (Signed)
Stop cephalexin Apply Vaseline Band-Aid to scaling area around the left great toe joint Wear athletic shoe with custom insoles

## 2017-01-15 DIAGNOSIS — R300 Dysuria: Secondary | ICD-10-CM | POA: Diagnosis not present

## 2017-01-15 DIAGNOSIS — N390 Urinary tract infection, site not specified: Secondary | ICD-10-CM | POA: Diagnosis not present

## 2017-01-20 ENCOUNTER — Ambulatory Visit: Payer: PRIVATE HEALTH INSURANCE | Admitting: Podiatry

## 2017-01-23 DIAGNOSIS — R300 Dysuria: Secondary | ICD-10-CM | POA: Diagnosis not present

## 2017-01-23 DIAGNOSIS — N390 Urinary tract infection, site not specified: Secondary | ICD-10-CM | POA: Diagnosis not present

## 2017-01-31 ENCOUNTER — Other Ambulatory Visit: Payer: Self-pay | Admitting: Cardiology

## 2017-01-31 DIAGNOSIS — M25572 Pain in left ankle and joints of left foot: Secondary | ICD-10-CM | POA: Diagnosis not present

## 2017-01-31 DIAGNOSIS — G8929 Other chronic pain: Secondary | ICD-10-CM | POA: Diagnosis not present

## 2017-02-17 ENCOUNTER — Ambulatory Visit: Payer: PRIVATE HEALTH INSURANCE | Admitting: Podiatry

## 2017-02-18 DIAGNOSIS — N390 Urinary tract infection, site not specified: Secondary | ICD-10-CM | POA: Diagnosis not present

## 2017-02-18 DIAGNOSIS — R300 Dysuria: Secondary | ICD-10-CM | POA: Diagnosis not present

## 2017-02-19 ENCOUNTER — Ambulatory Visit: Payer: PRIVATE HEALTH INSURANCE | Admitting: Podiatry

## 2017-02-21 ENCOUNTER — Ambulatory Visit (INDEPENDENT_AMBULATORY_CARE_PROVIDER_SITE_OTHER): Payer: Medicare Other | Admitting: Pharmacist

## 2017-02-21 DIAGNOSIS — I4891 Unspecified atrial fibrillation: Secondary | ICD-10-CM

## 2017-02-21 DIAGNOSIS — Z7901 Long term (current) use of anticoagulants: Secondary | ICD-10-CM | POA: Diagnosis not present

## 2017-02-21 LAB — POCT INR: INR: 2.2

## 2017-02-26 DIAGNOSIS — R300 Dysuria: Secondary | ICD-10-CM | POA: Diagnosis not present

## 2017-02-26 DIAGNOSIS — R358 Other polyuria: Secondary | ICD-10-CM | POA: Diagnosis not present

## 2017-02-26 DIAGNOSIS — N390 Urinary tract infection, site not specified: Secondary | ICD-10-CM | POA: Diagnosis not present

## 2017-03-07 ENCOUNTER — Ambulatory Visit: Payer: Medicare Other | Admitting: Podiatry

## 2017-03-07 DIAGNOSIS — N3020 Other chronic cystitis without hematuria: Secondary | ICD-10-CM | POA: Diagnosis not present

## 2017-03-26 ENCOUNTER — Ambulatory Visit (INDEPENDENT_AMBULATORY_CARE_PROVIDER_SITE_OTHER): Payer: Medicare Other | Admitting: Podiatry

## 2017-03-26 ENCOUNTER — Encounter: Payer: Self-pay | Admitting: Podiatry

## 2017-03-26 DIAGNOSIS — L98491 Non-pressure chronic ulcer of skin of other sites limited to breakdown of skin: Secondary | ICD-10-CM

## 2017-03-26 DIAGNOSIS — G609 Hereditary and idiopathic neuropathy, unspecified: Secondary | ICD-10-CM

## 2017-03-26 NOTE — Progress Notes (Signed)
This patient presents to the office for an evaluation of her skin lesion under her big toe joint, left foot.  She has been previously been seeing Dr. Amalia Hailey for a superficial ulcer and cellulitis on her left foot.  She was last seen in December 2020 819 for a cellulitis and pre-ulcerative callus left foot.  She now presents the office 10 weeks following that, December visit.  She says that she had developed another blister under the big toe joint and self treated herself with local dressings and Silvadene, which has allowed this area to improve.  She says there is no drainage or pus coming from her skin lesion, left foot.  She presents the office today for an evaluation and treatment of this skin  lesion, left foot. She does bring with her previously made orthotics which she has worn to prevent this skin lesion from breaking down.  General Appearance  Alert, conversant and in no acute stress.  Vascular  Dorsalis pedis and posterior pulses are palpable  bilaterally.  Capillary return is within normal limits  bilaterally. Temperature is within normal limits  Bilaterally.  Neurologic  Senn-Weinstein monofilament wire test diminished  bilaterally. Muscle power within normal limits bilaterally.  Nails Thickened nails both feet with no evidence of drainage or infection.  Orthopedic  No limitations of motion of motion feet bilaterally.  No crepitus or effusions noted.  No bony pathology or digital deformities noted. Prominent sesamoid apparatus left greater than right.  Skin  pre-ulcerated callus/healed blister under the big toe joint, left foot.  No evidence of any drainage, redness or infection   Pre-ulcerous lesion left foot.   ROV  Debride necrotic tissue.  . No bandage was applied since there was no evidence of any drainage or open skin lesion.  Padding was added to her already padded orthotic.  Discussed this condition with this patient and recommended she return to the office every 5 weeks for  continued evaluation and treatment.   Gardiner Barefoot DPM

## 2017-04-01 ENCOUNTER — Ambulatory Visit (INDEPENDENT_AMBULATORY_CARE_PROVIDER_SITE_OTHER): Payer: Medicare Other | Admitting: Pharmacist

## 2017-04-01 DIAGNOSIS — Z7901 Long term (current) use of anticoagulants: Secondary | ICD-10-CM | POA: Diagnosis not present

## 2017-04-01 DIAGNOSIS — I4891 Unspecified atrial fibrillation: Secondary | ICD-10-CM | POA: Diagnosis not present

## 2017-04-01 LAB — POCT INR: INR: 1.7

## 2017-04-04 DIAGNOSIS — M25572 Pain in left ankle and joints of left foot: Secondary | ICD-10-CM | POA: Diagnosis not present

## 2017-04-15 DIAGNOSIS — Z961 Presence of intraocular lens: Secondary | ICD-10-CM | POA: Diagnosis not present

## 2017-04-15 DIAGNOSIS — E119 Type 2 diabetes mellitus without complications: Secondary | ICD-10-CM | POA: Diagnosis not present

## 2017-04-15 DIAGNOSIS — H40052 Ocular hypertension, left eye: Secondary | ICD-10-CM | POA: Diagnosis not present

## 2017-04-15 DIAGNOSIS — H401113 Primary open-angle glaucoma, right eye, severe stage: Secondary | ICD-10-CM | POA: Diagnosis not present

## 2017-04-15 DIAGNOSIS — L239 Allergic contact dermatitis, unspecified cause: Secondary | ICD-10-CM | POA: Diagnosis not present

## 2017-04-16 ENCOUNTER — Ambulatory Visit: Payer: Medicare Other | Admitting: Podiatry

## 2017-04-21 DIAGNOSIS — M51369 Other intervertebral disc degeneration, lumbar region without mention of lumbar back pain or lower extremity pain: Secondary | ICD-10-CM | POA: Insufficient documentation

## 2017-04-21 DIAGNOSIS — M961 Postlaminectomy syndrome, not elsewhere classified: Secondary | ICD-10-CM | POA: Diagnosis not present

## 2017-04-21 DIAGNOSIS — G894 Chronic pain syndrome: Secondary | ICD-10-CM | POA: Diagnosis not present

## 2017-04-21 DIAGNOSIS — M5136 Other intervertebral disc degeneration, lumbar region: Secondary | ICD-10-CM | POA: Insufficient documentation

## 2017-04-22 ENCOUNTER — Encounter: Payer: Self-pay | Admitting: Podiatry

## 2017-04-22 ENCOUNTER — Ambulatory Visit (INDEPENDENT_AMBULATORY_CARE_PROVIDER_SITE_OTHER): Payer: Medicare Other

## 2017-04-22 ENCOUNTER — Ambulatory Visit (INDEPENDENT_AMBULATORY_CARE_PROVIDER_SITE_OTHER): Payer: Medicare Other | Admitting: Podiatry

## 2017-04-22 VITALS — BP 157/80 | HR 96 | Temp 98.5°F | Resp 18

## 2017-04-22 DIAGNOSIS — L03116 Cellulitis of left lower limb: Secondary | ICD-10-CM | POA: Diagnosis not present

## 2017-04-22 DIAGNOSIS — L97529 Non-pressure chronic ulcer of other part of left foot with unspecified severity: Secondary | ICD-10-CM

## 2017-04-22 MED ORDER — MUPIROCIN 2 % EX OINT: 1.0000 "application " | TOPICAL_OINTMENT | Freq: Two times a day (BID) | CUTANEOUS | 2 refills | Status: DC

## 2017-04-22 MED ORDER — CEPHALEXIN 500 MG PO CAPS: 500.0000 mg | ORAL_CAPSULE | Freq: Three times a day (TID) | ORAL | 0 refills | Status: DC

## 2017-04-22 NOTE — Patient Instructions (Signed)
Instructions for Wound Care  The most important step to healing a foot wound is to reduce the pressure on your foot - it is extremely important to stay off your foot as much as possible and wear the shoe/boot as instructed.  Cleanse your foot with saline. Apply prescribed medication to your wound and cover with gauze and a bandage.  May hold bandage in place with Coban (self sticky wrap), Ace bandage or tape.  You may find dressing supplies at your local [employer], [employer], drug store or medical supply store.  Monitor for any signs/symptoms of infection. If there is any increase in redness, red streaks, increase in drainage, warmth to your foot please give [place] a call. Also, if you start to run a fever or have "flu-like" symptoms that can also be a sign of infection. Call the office immediately if any occur or go directly to the emergency room.   If you have any questions, please feel free to give [place] a call at [phone] or if you are on "MyChart" you can always send me a message if needed.

## 2017-04-23 LAB — COMPLETE METABOLIC PANEL WITH GFR
AG Ratio: 1.4 (calc) (ref 1.0–2.5)
ALBUMIN MSPROF: 3.7 g/dL (ref 3.6–5.1)
ALKALINE PHOSPHATASE (APISO): 82 U/L (ref 33–130)
ALT: 12 U/L (ref 6–29)
AST: 16 U/L (ref 10–35)
BILIRUBIN TOTAL: 0.5 mg/dL (ref 0.2–1.2)
BUN / CREAT RATIO: 23 (calc) — AB (ref 6–22)
BUN: 21 mg/dL (ref 7–25)
CHLORIDE: 105 mmol/L (ref 98–110)
CO2: 29 mmol/L (ref 20–32)
Calcium: 8.7 mg/dL (ref 8.6–10.4)
Creat: 0.9 mg/dL — ABNORMAL HIGH (ref 0.60–0.88)
GFR, EST AFRICAN AMERICAN: 70 mL/min/{1.73_m2} (ref >=60)
GFR, Est Non African American: 60 mL/min/{1.73_m2} (ref >=60)
GLUCOSE: 105 mg/dL (ref 65–139)
Globulin: 2.7 g/dL (calc) (ref 1.9–3.7)
Potassium: 3.8 mmol/L (ref 3.5–5.3)
Sodium: 145 mmol/L (ref 135–146)
TOTAL PROTEIN: 6.4 g/dL (ref 6.1–8.1)

## 2017-04-23 LAB — C-REACTIVE PROTEIN: CRP: 19.2 mg/L — ABNORMAL HIGH (ref <8.0)

## 2017-04-23 LAB — CBC WITH DIFFERENTIAL/PLATELET
BASOS PCT: 0.7 %
Basophils Absolute: 43 cells/uL (ref 0–200)
EOS PCT: 6.5 %
Eosinophils Absolute: 403 cells/uL (ref 15–500)
HCT: 35.6 % (ref 35.0–45.0)
HEMOGLOBIN: 11.9 g/dL (ref 11.7–15.5)
Lymphs Abs: 1159 cells/uL (ref 850–3900)
MCH: 28.5 pg (ref 27.0–33.0)
MCHC: 33.4 g/dL (ref 32.0–36.0)
MCV: 85.4 fL (ref 80.0–100.0)
MONOS PCT: 7.3 %
MPV: 12.6 fL — AB (ref 7.5–12.5)
NEUTROS ABS: 4142 {cells}/uL (ref 1500–7800)
Neutrophils Relative %: 66.8 %
Platelets: 147 10*3/uL (ref 140–400)
RBC: 4.17 10*6/uL (ref 3.80–5.10)
RDW: 16 % — ABNORMAL HIGH (ref 11.0–15.0)
Total Lymphocyte: 18.7 %
WBC mixed population: 453 cells/uL (ref 200–950)
WBC: 6.2 10*3/uL (ref 3.8–10.8)

## 2017-04-23 LAB — SEDIMENTATION RATE: Sed Rate: 43 mm/h — ABNORMAL HIGH (ref 0–30)

## 2017-04-24 ENCOUNTER — Telehealth: Payer: Self-pay | Admitting: *Deleted

## 2017-04-24 NOTE — Telephone Encounter (Signed)
-----  Message from Trula Slade, DPM sent at 04/23/2017 12:47 PM EDT ----- Please let her know that the WBC is elevated but the CRP and ESR (which measures inflammation) is elevated.

## 2017-04-24 NOTE — Telephone Encounter (Signed)
I informed pt of Dr. Leigh Aurora review of results and to continue the antibiotic and keep the 05/01/2017 9:15am appt, call with concerns. Pt states understanding.

## 2017-04-27 NOTE — Progress Notes (Signed)
Subjective: [name] presents the office today for concerns of an ulceration and infection to the left foot.  This over the weekend and she had some Keflex at home that was given to her previously and she did start taking this.  She did have some clear to bloody drainage coming from the area.  She states the redness has gotten somewhat better since she started the antibiotics.  She said no other treatment.  She is not sure pelvis wound started. Denies any systemic complaints such as fevers, chills, nausea, vomiting. No acute changes since last appointment, and no other complaints at this time.   Objective: AAO x3, NAD DP/PT pulses palpable bilaterally, CRT less than 3 seconds Sensation decreased with Simms Weinstein monofilament On the left foot submetatarsal one is a thick hyperkeratotic lesion with central ulceration.  After debridement the wound measures 1.2 x 1.2 cm.  Superficial with a granular wound base.  There is no probing, undermining or tunneling.  There is no exposed bone or tendon.  There is surrounding erythema extending to the first MTPJ medially.  Mild increase in swelling to the left foot mild increase in warmth to the foot overall compared to the contralateral extremity.  There is no other open lesions or pre-ulcerative lesion identified today. No pain with calf compression, swelling, warmth, erythema        Assessment: Ulceration, cellulitis left foot  Plan: -All treatment options discussed with the patient including all alternatives, risks, complications.  -X-rays were obtained and reviewed.  No definitive evidence of cortical destruction suggestive of osteomyelitis.  No soft tissue edema. -After verbal consent was obtained the area was cleaned with alcohol and using #312 blade scalpel to sharply debride the wound to healthy, granular tissue.  The wound was cleaned.  We will continue with antibiotic ointment dressing changes daily.  We will also continue Keflex.  She has a  surgical shoe at home as well as the pad to help offload this area and on her to wear this.  Ordered blood work including CBC, CMP, ESR, CRP -Monitor for any clinical signs or symptoms of infection and directed to call the office immediately should any occur or go to the ER. -RTC 1 week or sooner if needed.  Trula Slade DPM

## 2017-04-28 DIAGNOSIS — H401113 Primary open-angle glaucoma, right eye, severe stage: Secondary | ICD-10-CM | POA: Diagnosis not present

## 2017-04-29 ENCOUNTER — Ambulatory Visit (INDEPENDENT_AMBULATORY_CARE_PROVIDER_SITE_OTHER): Payer: Medicare Other | Admitting: Pharmacist

## 2017-04-29 DIAGNOSIS — I4891 Unspecified atrial fibrillation: Secondary | ICD-10-CM | POA: Diagnosis not present

## 2017-04-29 DIAGNOSIS — Z7901 Long term (current) use of anticoagulants: Secondary | ICD-10-CM | POA: Diagnosis not present

## 2017-04-29 LAB — POCT INR: INR: 1.6

## 2017-04-30 ENCOUNTER — Ambulatory Visit: Payer: Medicare Other | Admitting: Podiatry

## 2017-05-01 ENCOUNTER — Other Ambulatory Visit: Payer: Medicare Other | Admitting: Orthotics

## 2017-05-01 ENCOUNTER — Ambulatory Visit (INDEPENDENT_AMBULATORY_CARE_PROVIDER_SITE_OTHER): Payer: Medicare Other | Admitting: Podiatry

## 2017-05-01 ENCOUNTER — Encounter: Payer: Self-pay | Admitting: Podiatry

## 2017-05-01 ENCOUNTER — Ambulatory Visit: Payer: Medicare Other | Admitting: Orthotics

## 2017-05-01 DIAGNOSIS — L03116 Cellulitis of left lower limb: Secondary | ICD-10-CM

## 2017-05-01 DIAGNOSIS — L97529 Non-pressure chronic ulcer of other part of left foot with unspecified severity: Secondary | ICD-10-CM

## 2017-05-01 NOTE — Progress Notes (Signed)
Patient will make decision on diabetic footwear after ulcer has healed a little bit more.

## 2017-05-05 NOTE — Progress Notes (Signed)
Subjective: [name] presents to the office follow-up evaluation of a wound on the left foot as well as for cellulitis.  She states the redness there is doing much better.  She is on the antibiotics.  She has been changing the bandage daily and she has not noticed any drainage or pus coming from the area.  She is been given a small amount of antibiotic ointment on the wound.  She denies any red streaks.  No pain. Denies any systemic complaints such as fevers, chills, nausea, vomiting. No acute changes since last appointment, and no other complaints at this time.   Objective: AAO x3, NAD DP/PT pulses palpable bilaterally, CRT less than 3 seconds Ulceration present on the left foot submetatarsal 1.  The wound measures 0.9 x 0.9 x 0.2 cm today after debridement.  There is no probing to bone, undermining or tunneling.  There is significantly improved erythema along the area but there is no ascending cellulitis.  There is no fluctuation or crepitation.  There is no malodor.  Plantar flexion of the first ray present.  No other ulcerations or pre-ulcerative lesions. No pain with calf compression, swelling, warmth, erythema  Assessment: Ulceration left foot with improved cellulitis  Plan: -All treatment options discussed with the patient including all alternatives, risks, complications.  -Sharpy debrided x2 without any complications to healthy, bleeding tissue.  Continue antibiotic ointment dressing changes daily.  Continue offloading at all times.  Finished course of antibiotics.  She also was evaluated by Liliane Channel today for possibly inserts for her shoes to help offload the area.  Much free signs or symptoms of infection to the ER should any occur.  I will see her back as scheduled or sooner if needed.  She has no further questions or concerns today. -Patient encouraged to call the office with any questions, concerns, change in symptoms.   Trula Slade DPM

## 2017-05-06 ENCOUNTER — Ambulatory Visit (INDEPENDENT_AMBULATORY_CARE_PROVIDER_SITE_OTHER): Payer: Medicare Other | Admitting: Family

## 2017-05-06 ENCOUNTER — Other Ambulatory Visit: Payer: Self-pay

## 2017-05-06 ENCOUNTER — Ambulatory Visit (HOSPITAL_COMMUNITY)
Admission: RE | Admit: 2017-05-06 | Discharge: 2017-05-06 | Disposition: A | Discharge status: Home / Self Care | Payer: Medicare Other | Source: Ambulatory Visit | Attending: Family | Admitting: Family

## 2017-05-06 ENCOUNTER — Encounter: Payer: Self-pay | Admitting: Family

## 2017-05-06 VITALS — BP 192/84 | HR 93 | Temp 97.1°F | Resp 16 | Ht 62.0 in | Wt 222.0 lb

## 2017-05-06 DIAGNOSIS — I6523 Occlusion and stenosis of bilateral carotid arteries: Secondary | ICD-10-CM | POA: Insufficient documentation

## 2017-05-06 DIAGNOSIS — Z471 Aftercare following joint replacement surgery: Secondary | ICD-10-CM | POA: Diagnosis not present

## 2017-05-06 DIAGNOSIS — I6521 Occlusion and stenosis of right carotid artery: Secondary | ICD-10-CM | POA: Diagnosis not present

## 2017-05-06 DIAGNOSIS — Z87891 Personal history of nicotine dependence: Secondary | ICD-10-CM | POA: Insufficient documentation

## 2017-05-06 DIAGNOSIS — I10 Essential (primary) hypertension: Secondary | ICD-10-CM | POA: Insufficient documentation

## 2017-05-06 DIAGNOSIS — Z96641 Presence of right artificial hip joint: Secondary | ICD-10-CM | POA: Diagnosis not present

## 2017-05-06 DIAGNOSIS — M1711 Unilateral primary osteoarthritis, right knee: Secondary | ICD-10-CM | POA: Diagnosis not present

## 2017-05-06 DIAGNOSIS — I6522 Occlusion and stenosis of left carotid artery: Secondary | ICD-10-CM | POA: Diagnosis not present

## 2017-05-06 DIAGNOSIS — M1611 Unilateral primary osteoarthritis, right hip: Secondary | ICD-10-CM | POA: Diagnosis not present

## 2017-05-06 DIAGNOSIS — Z96651 Presence of right artificial knee joint: Secondary | ICD-10-CM | POA: Diagnosis not present

## 2017-05-06 NOTE — Patient Instructions (Signed)

## 2017-05-06 NOTE — Progress Notes (Signed)
Vitals:   05/06/17 1344 05/06/17 1351  BP: (!) 179/96 (!) 185/103  Pulse: 96 93  Resp: 16   Temp: (!) 97.1 F (36.2 C)   TempSrc: Oral   SpO2: 94%   Weight: 222 lb (100.7 kg)   Height: 5' 2"  (1.575 m)

## 2017-05-06 NOTE — Progress Notes (Signed)
Chief Complaint: Follow up Extracranial Carotid Artery Stenosis   History of Present Illness  [name] is a 83 y.o. female whom Dr. Donnetta Hutching has monitored for left-sided carotid stenosis with a known right ICA occlusion.  Patient has not had previous carotid artery intervention.  She denies any remote or recent stroke of TIA symptoms.  Denies steal symptoms.  Does not have claudication symptoms.  Tires easily since she had the PE.   Has mild neuropathy in both feet for years, attributes to back issues.  She had detatched retina repaired Dec. 2, 2014, had cataract surgery July, 2015, can see much better, no more headaches. She has Crohn's Disease and was evaluted for occult GI bleed, is anemic, states her Hg is increasing with oral iron. April, 2014 she had a left total shoulder replaced, then developed PE's, she now takes coumadin.  Her right shoulder has issues.  She has had lumbar spine surgery.  She has severe generalized OA, has affected her feet, has trouble walking. She was receiving physical therapy, is doing some walking. Is also getting some treatments to her feet (OA pain) that helps with pain and is able to walk more.   She also has fibromyalgia.  She states she has proven white coat syndrome.  She had an exacerbation of her CHF January 2016, observed in ED, did not need to be admitted.  She had her left foot injected 04/24/15 with a nerve block for pain relief. She developed a severe URI early in 2017 with a relapse, had PT to help with weakness. She saw Dr. Missy Sabins in February 2017 for borderline CHF.   Pt Diabetic: No Pt smoker: former smoker, quit in 1988, smoked x 15 years, 1/2 ppd  Pt meds include:  Statin : No: cannot tolerate  Betablocker: Yes  ASA: No  Other anticoagulants/antiplatelets: coumadin for atrial fib and hx of PE post shoulder surgery    Past Medical History:  Diagnosis Date  . Acid reflux disease   . Atrial  fibrillation ([place])   . Carotid artery occlusion   . Chronic diastolic CHF (congestive heart failure) (HCC)    Hypertensive heart disease 02-12-14  . Crohn disease ([place])   . Crohn's disease ([place])   . Detached retina   . Fibromyalgia   . Fibromyalgia   . GERD (gastroesophageal reflux disease)   . Gout   . H/O hiatal hernia   . High triglycerides   . History of stomach ulcers 1950's  . Hypertension   . Long term current use of anticoagulant   . Obstructive sleep apnea on CPAP   . Osteoarthritis    PAIN AND OA LEF T HIP AND BOTH SHOULDERS ARE BONE ON BONE AND PAINFUL  . Peripheral vascular disease (HCC)    KNOWN RIGHT INTERNAL CAROTID ARTERY OCCLUSION (NO STROKE)  --40 TO 59% STENOSIS LEFT ICA-FOLLOWED BY DR. EARLY WITH DOPPLER STUDY EVERY 6 MONTHS  . Pulmonary embolism ([place]) 2011   a. Hx of PE in 02/2009 after R hip surgery, venous dopplers negative, long-term Coumadin.  [name] Kitchen PVC (premature ventricular contraction)    PT STATES HX OF PVC'S ON EKG  . Recurrent upper respiratory infection (URI)    BRONCHITIS FEB 2013--SLIGHT COUGH NON-PRODUCTIVE NOW  . Recurrent UTI (urinary tract infection)    "on daily medicine" (05/14/2012)  . Tinnitus   . Vertigo     Social History Social History   Tobacco Use  . Smoking status: Former Smoker    Packs/day: 0.50  Years: 15.00    Pack years: 7.50    Types: Cigarettes    Last attempt to quit: 02/11/1986    Years since quitting: 31.2  . Smokeless tobacco: Never Used  Substance Use Topics  . Alcohol use: Yes    Alcohol/week: 0.0 oz    Comment: 05/14/2012 "have 2-3 drinks/yr" maybe  . Drug use: No    Family History Family History  Problem Relation Age of Onset  . Heart disease Father        Heart Disease before age 54  . Heart attack Father 38  . Hypertension Father   . Peripheral vascular disease Father        Right  leg amputation  . Heart disease Mother        AAA  . Stroke Mother   . Aneurysm Mother   . Hypertension Mother   . AAA  (abdominal aortic aneurysm) Mother   . Diabetes Paternal Grandfather   . Hypertension Brother   . Heart disease Brother        Heart Disease before age 41  . Hyperlipidemia Brother   . Heart attack Brother   . Hypertension Brother   . Diabetes Son     Surgical History Past Surgical History:  Procedure Laterality Date  . APPENDECTOMY  1950's  . BACK SURGERY  10/29/06   Central and foraminal decompression L3-L4, L4-L5, and L5-S1 with inspection of L4-L5 and L5-S1 disc on the right  . CARDIAC CATHETERIZATION  1970's   "maybe 2" (05/14/2012)  . CARDIAC CATHETERIZATION    . CATARACT EXTRACTION W/ INTRAOCULAR LENS IMPLANT Right 2009  . CHOLECYSTECTOMY  ~ 1988  . DECOMPRESSIVE LUMBAR LAMINECTOMY LEVEL 3  ~ 2002  . DILATION AND CURETTAGE OF UTERUS     "several; from miscarriages" (05/14/2012)  . EXCISION MORTON'S NEUROMA Right 05/20/00   "foot" (05/14/2012)  . EYE SURGERY Left 2014   Detached retina  . EYE SURGERY Left August 23, 2013   Cataract  . FEMUR FRACTURE SURGERY Right 02/21/09   Open reduction internal fixation of right periprosthetic  femur fracture utilizing Zimmer cables times fiv  . FRACTURE SURGERY  2011   Right Femur Fx  . HAMMER TOE SURGERY Left 10/25/08   "toe next to big to" (05/14/2012)  . JOINT REPLACEMENT  2009   Right Hip replacement  . JOINT REPLACEMENT  2012   Right knee replacement  . JOINT REPLACEMENT  06/17/11   Left Hip replacement  . KNEE ARTHROSCOPY Right 07/26/05  . REPLACEMENT TOTAL KNEE Right 02/19/2010  . SPINE SURGERY    . [place]?  . TOTAL HIP ARTHROPLASTY Right 12/08/02   Osteonics total hip replacement  . TOTAL HIP ARTHROPLASTY  06/17/2011   Procedure: TOTAL HIP ARTHROPLASTY;  Surgeon: Gearlean Alf, MD;  Location: [facility];  Service: Orthopedics;  Laterality: Left;  . TOTAL SHOULDER ARTHROPLASTY Left 05/14/2012  . TOTAL SHOULDER ARTHROPLASTY Left 05/14/2012   Procedure: TOTAL SHOULDER ARTHROPLASTY;  Surgeon: Marin Shutter, MD;   Location: [facility];  Service: Orthopedics;  Laterality: Left;  [name] Kitchen VAGINAL HYSTERECTOMY  1971    Allergies  Allergen Reactions  . Codeine     NAUSEA  . Cymbalta [Duloxetine Hcl]     Nausea VOMITING AND ABDOMINAL PAIN, HEADACHE, JUST ABOUT EVERY SIDE EFFECT THE DRUG HAS  . Diltiazem Cd [Diltiazem Hcl Er Beads]     Heavy legs, cough  . Metoprolol     Legs like cement  . Nortriptyline  Nightmares  . Penicillins     RASH  & ITCHING   --PT STATES SHE CAN TAKE KEFLEX PO AND IV CEPHALOSPORINS  . Statins     Leg cramps  . Tetanus Toxoid Adsorbed Swelling  . Tetanus Toxoids     SWELLING, REDNESS  WHOLE ARM  . Ciprofloxacin Rash    RASH  . Lyrica [Pregabalin] Diarrhea, Nausea And Vomiting and Rash  . Penicillin G Rash    Current Outpatient Medications  Medication Sig Dispense Refill  . allopurinol (ZYLOPRIM) 300 MG tablet Take 300 mg by mouth daily.    [name] Kitchen amLODipine (NORVASC) 2.5 MG tablet Take 2.5 mg by mouth daily.    . cetirizine (ZYRTEC) 10 MG tablet Take 10 mg by mouth daily.    . clobetasol cream (TEMOVATE) 7.25 % Apply 1 application topically 2 (two) times daily.    [name] Kitchen CRANBERRY CONCENTRATE PO Take 1 tablet by mouth daily.    . dorzolamide-timolol (COSOPT) 22.3-6.8 MG/ML ophthalmic solution Place 1 drop into both eyes 2 (two) times daily.     . furosemide (LASIX) 20 MG tablet Take 0.5 tablets (10 mg total) by mouth daily. 45 tablet 0  . HYDROcodone-acetaminophen (NORCO) 10-325 MG per tablet Take 1 tablet by mouth every 6 (six) hours as needed (pain).     . indapamide (LOZOL) 2.5 MG tablet Take 2.5 mg by mouth daily.    [name] Kitchen ketoconazole (NIZORAL) 2 % cream Apply 1 application topically 2 (two) times daily as needed for irritation.     [name] Kitchen LUMIGAN 0.01 % SOLN Place 1 drop into both eyes at bedtime.    . mupirocin ointment (BACTROBAN) 2 % Apply 1 application topically 2 (two) times daily. 30 g 2  . nebivolol (BYSTOLIC) 10 MG tablet Take 1 tablet (10 mg total) by mouth at bedtime. 90  tablet 3  . NONFORMULARY OR COMPOUNDED ITEM [facility]  Peripheral Neuropathy Cream- Bupivacaine 1%, Doxepin 3%, Gabapentin 6%, Pentoxifylline 3%, Topiramate 1% Apply 1-2 grams to affected area 3-4 times daily Qty. 120 gm 3 refills 1 each 0  . omeprazole (PRILOSEC) 40 MG capsule Take 40 mg by mouth daily.    . ondansetron (ZOFRAN ODT) 4 MG disintegrating tablet Take 1 tablet (4 mg total) by mouth every 8 (eight) hours as needed for nausea or vomiting. 20 tablet 0  . potassium chloride (K-DUR,KLOR-CON) 10 MEQ tablet Take 10 mEq by mouth 2 (two) times daily.    . silver sulfADIAZINE (SILVADENE) 1 % cream Apply 1 application topically daily. 50 g 0  . traZODone (DESYREL) 50 MG tablet Take 50 mg by mouth at bedtime.    . triamcinolone (NASACORT ALLERGY 24HR) 55 MCG/ACT AERO nasal inhaler Place 2 sprays into the nose daily as needed.    . warfarin (COUMADIN) 2.5 MG tablet TAKE 1 TO 1&1/2 TABLETS DAILY AS DIRECTED BY COUMADIN CLINIC. 120 tablet 1  . cephALEXin (KEFLEX) 500 MG capsule Take 1 capsule (500 mg total) by mouth 3 (three) times daily. (Patient not taking: Reported on 05/06/2017) 30 capsule 0   No current facility-administered medications for this visit.     Review of Systems : See HPI for pertinent positives and negatives.  Physical Examination  Vitals:   05/06/17 1344 05/06/17 1351 05/06/17 1352  BP: (!) 179/96 (!) 185/103 (!) 192/84  Pulse: 96 93 93  Resp: 16    Temp: (!) 97.1 F (36.2 C)    TempSrc: Oral    SpO2: 94%    Weight: 222 lb (  100.7 kg)    Height: 5' 2"  (1.575 m)     Body mass index is 40.6 kg/m.  General: WDWN morbidly obese female in NAD  GAIT:slow, deliberate, using cane HENT: no gross abnormalities  Eyes: PERRLA  Pulmonary: CTAB, respirations are non labored Cardiac: Regular rhythm, no detected murmur  VASCULAR EXAM Carotid Bruits Left Right   Positive  Negative    Abdominal aortic pulse is not palpable  Radial  pulses are 2+ palpable and equal.   LE Pulses  LEFT  RIGHT   POPLITEAL  not palpable  not palpable  DP palpable palpable   Bilateral DP pulses are palpable. Graduated compression hose are in place.   Gastrointestinal: soft, nontender, BS WNL, no r/g, no palpable masses.  Musculoskeletal: No muscle atrophy/wasting. M/S 4/5 throughout, Extremities without ischemic changes.  Skin: No rashes, no ulcers, no cellulitis.   Neurologic:  A&O X 3; appropriate affect, sensation is normal; speech is normal, CN 2-12 intact, pain and light touch intact in extremities, motor exam as listed above. Psychiatric: Normal thought content, mood appropriate to clinical situation.     Assessment: TRISTIN GLADMAN is a 83 y.o. female whois seen for left internal carotid artery stenosis with a known right ICA occlusion. She has no history of stroke or TIA.  She takes coumadin for hx of PE and atrial fib. She is statin intolerant, is morbidly obese.  She smoked x 15 years, quit in 1988, does not have a diagnosis of DM   Her blood pressure at home runs 120-130/68-74, is elevated now, she is asymptomatic of this. I advised her to notify her PCP or cardiologist if her blood pressure remains >150/90 at home.   DATA Carotid Duplex (05/06/17): Confirmed occlusion of the right internal carotid artery 60 - 79 % left ICA stenosis. Significant stenosis of the bilateral ECA. Bilateral vertebral artery flow is antegrade.  Bilateral subclavian artery waveforms are normal.  Increased stenosis in the left ICA compared to the exam on 04-30-16.    Plan:  Follow-up in 6 months with Carotid Duplex scan.    I discussed in depth with the patient the nature of atherosclerosis, and emphasized the importance of maximal medical management including strict control of blood pressure, blood glucose, and lipid levels, obtaining regular exercise, and continued cessation of smoking.  The patient is aware  that without maximal medical management the underlying atherosclerotic disease process will progress, limiting the benefit of any interventions. The patient was given information about stroke prevention and what symptoms should prompt the patient to seek immediate medical care. Thank you for allowing [place] to participate in this patient's care.  Clemon Chambers, RN, MSN, FNP-C [facility] Office: [phone]  Clinic Physician: Early  05/06/17 1:54 PM

## 2017-05-09 ENCOUNTER — Other Ambulatory Visit: Payer: Self-pay

## 2017-05-09 DIAGNOSIS — I6521 Occlusion and stenosis of right carotid artery: Secondary | ICD-10-CM

## 2017-05-09 DIAGNOSIS — I6522 Occlusion and stenosis of left carotid artery: Secondary | ICD-10-CM

## 2017-05-13 ENCOUNTER — Ambulatory Visit: Payer: PRIVATE HEALTH INSURANCE | Admitting: Podiatry

## 2017-05-13 ENCOUNTER — Other Ambulatory Visit: Payer: Medicare Other | Admitting: Orthotics

## 2017-05-15 ENCOUNTER — Telehealth: Payer: Self-pay | Admitting: Podiatry

## 2017-05-15 NOTE — Telephone Encounter (Signed)
Pt called and requested to talk to you about her appt next week.  She did not want to discuss with me.

## 2017-05-16 ENCOUNTER — Telehealth: Payer: Self-pay | Admitting: *Deleted

## 2017-05-16 NOTE — Telephone Encounter (Signed)
Patient called yesterday and stated that she had fallen and turned her ankle and was going to go see the orthopedic doctor and would not be in to see Dr Jacqualyn Posey for a while but just wanted to call and let us know what was going on. Lattie Haw

## 2017-05-19 ENCOUNTER — Other Ambulatory Visit: Payer: Medicare Other | Admitting: Orthotics

## 2017-05-19 ENCOUNTER — Ambulatory Visit: Payer: Medicare Other | Admitting: Podiatry

## 2017-05-25 NOTE — Progress Notes (Signed)
Cardiology Office Note   Date:  05/27/2017   ID:  [name], DOB 02/08/1935, MRN [id number]  PCP:  Deland Pretty, MD  Cardiologist: Kendale Rembold Martinique MD  Chief Complaint  Patient presents with  . Atrial Fibrillation      History of Present Illness: [name] is a 83 y.o. female who presents for follow up CHF and atrial fibrillation.    She was admitted to the hospital in February 2015 because of new onset atrial fibrillation. She was already on long-term Coumadin. Diltiazem for rate control was added to her regimen in the hospital. The patient has a past history of pulmonary emboli and is on long-term Coumadin. She has a history of essential hypertension and a history of right carotid artery occlusion. 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 Donnetta Hutching. Last doppler in March 2019 showed some progression on the left to 60-79%. Now followed at 6 month interrval.  She does not have any significant coronary disease and she had cardiac catheterization in 1994 showing only mild plaque and she had a nuclear stress test in December 2011 showing no ischemia and her ejection fraction was 78%.   On a prior visit she complained of marked fatigue so we discontinued her Lozol without much change. She was maintained on Bystolic and prn lasix.   On follow up today she is doing well from a cardiac standpoint. She did suffer a recent mechanical fall and landed on her right side. Suffered a lot of bruising but X rays were OK. Is now seeing Dr. Nelva Bush for management of chronic pain. Is tapering hydrocodone. On Celebrex. Recent [facility] evaluation showed some progression of left ICA disease. Chronic occlusion of RICA.     Past Medical History:  Diagnosis Date  . Acid reflux disease   . Atrial fibrillation ([place])   . Carotid artery occlusion   . Chronic diastolic CHF (congestive heart failure) (HCC)    Hypertensive heart disease  02-12-14  . Crohn disease ([place])   . Crohn's disease ([place])   . Detached retina   . Fibromyalgia   . Fibromyalgia   . GERD (gastroesophageal reflux disease)   . Gout   . H/O hiatal hernia   . High triglycerides   . History of stomach ulcers 1950's  . Hypertension   . Long term current use of anticoagulant   . Obstructive sleep apnea on CPAP   . Osteoarthritis    PAIN AND OA LEF T HIP AND BOTH SHOULDERS ARE BONE ON BONE AND PAINFUL  . Peripheral vascular disease (HCC)    KNOWN RIGHT INTERNAL CAROTID ARTERY OCCLUSION (NO STROKE)  --40 TO 59% STENOSIS LEFT ICA-FOLLOWED BY DR. EARLY WITH DOPPLER STUDY EVERY 6 MONTHS  . Pulmonary embolism ([place]) 2011   a. Hx of PE in 02/2009 after R hip surgery, venous dopplers negative, long-term Coumadin.  [name] Kitchen PVC (premature ventricular contraction)    PT STATES HX OF PVC'S ON EKG  . Recurrent upper respiratory infection (URI)    BRONCHITIS FEB 2013--SLIGHT COUGH NON-PRODUCTIVE NOW  . Recurrent UTI (urinary tract infection)    "on daily medicine" (05/14/2012)  . Tinnitus   . Vertigo     Past Surgical History:  Procedure Laterality Date  . APPENDECTOMY  1950's  . BACK SURGERY  10/29/06   Central and foraminal decompression L3-L4, L4-L5, and L5-S1 with inspection of L4-L5 and L5-S1 disc on  the right  . CARDIAC CATHETERIZATION  1970's   "maybe 2" (05/14/2012)  . CARDIAC CATHETERIZATION    . CATARACT EXTRACTION W/ INTRAOCULAR LENS IMPLANT Right 2009  . CHOLECYSTECTOMY  ~ 1988  . DECOMPRESSIVE LUMBAR LAMINECTOMY LEVEL 3  ~ 2002  . DILATION AND CURETTAGE OF UTERUS     "several; from miscarriages" (05/14/2012)  . EXCISION MORTON'S NEUROMA Right 05/20/00   "foot" (05/14/2012)  . EYE SURGERY Left 2014   Detached retina  . EYE SURGERY Left August 23, 2013   Cataract  . FEMUR FRACTURE SURGERY Right 02/21/09   Open reduction internal fixation of right periprosthetic  femur fracture utilizing Zimmer cables times fiv  . FRACTURE SURGERY  2011   Right Femur Fx  . HAMMER  TOE SURGERY Left 10/25/08   "toe next to big to" (05/14/2012)  . JOINT REPLACEMENT  2009   Right Hip replacement  . JOINT REPLACEMENT  2012   Right knee replacement  . JOINT REPLACEMENT  06/17/11   Left Hip replacement  . KNEE ARTHROSCOPY Right 07/26/05  . REPLACEMENT TOTAL KNEE Right 02/19/2010  . SPINE SURGERY    . [place]?  . TOTAL HIP ARTHROPLASTY Right 12/08/02   Osteonics total hip replacement  . TOTAL HIP ARTHROPLASTY  06/17/2011   Procedure: TOTAL HIP ARTHROPLASTY;  Surgeon: Gearlean Alf, MD;  Location: [facility];  Service: Orthopedics;  Laterality: Left;  . TOTAL SHOULDER ARTHROPLASTY Left 05/14/2012  . TOTAL SHOULDER ARTHROPLASTY Left 05/14/2012   Procedure: TOTAL SHOULDER ARTHROPLASTY;  Surgeon: Marin Shutter, MD;  Location: [facility];  Service: Orthopedics;  Laterality: Left;  [name] Kitchen VAGINAL HYSTERECTOMY  1971     Current Outpatient Medications  Medication Sig Dispense Refill  . allopurinol (ZYLOPRIM) 300 MG tablet Take 300 mg by mouth daily.    [name] Kitchen amLODipine (NORVASC) 2.5 MG tablet Take 2.5 mg by mouth daily.    . cephALEXin (KEFLEX) 500 MG capsule Take 1 capsule (500 mg total) by mouth 3 (three) times daily. 30 capsule 0  . cetirizine (ZYRTEC) 10 MG tablet Take 10 mg by mouth daily.    . clobetasol cream (TEMOVATE) 1.54 % Apply 1 application topically 2 (two) times daily.    [name] Kitchen CRANBERRY CONCENTRATE PO Take 1 tablet by mouth daily.    . dorzolamide-timolol (COSOPT) 22.3-6.8 MG/ML ophthalmic solution Place 1 drop into both eyes 2 (two) times daily.     . furosemide (LASIX) 20 MG tablet Take 0.5 tablets (10 mg total) by mouth daily. 45 tablet 0  . HYDROcodone-acetaminophen (NORCO) 10-325 MG per tablet Take 1 tablet by mouth every 6 (six) hours as needed (pain).     . indapamide (LOZOL) 2.5 MG tablet Take 2.5 mg by mouth daily.    [name] Kitchen ketoconazole (NIZORAL) 2 % cream Apply 1 application topically 2 (two) times daily as needed for irritation.     [name] Kitchen LUMIGAN 0.01 %  SOLN Place 1 drop into both eyes at bedtime.    . mupirocin ointment (BACTROBAN) 2 % Apply 1 application topically 2 (two) times daily. 30 g 2  . nebivolol (BYSTOLIC) 10 MG tablet Take 1 tablet (10 mg total) by mouth at bedtime. 90 tablet 3  . NONFORMULARY OR COMPOUNDED ITEM [facility]  Peripheral Neuropathy Cream- Bupivacaine 1%, Doxepin 3%, Gabapentin 6%, Pentoxifylline 3%, Topiramate 1% Apply 1-2 grams to affected area 3-4 times daily Qty. 120 gm 3 refills 1 each 0  . omeprazole (PRILOSEC) 40 MG capsule Take 40 mg by  mouth daily.    . ondansetron (ZOFRAN ODT) 4 MG disintegrating tablet Take 1 tablet (4 mg total) by mouth every 8 (eight) hours as needed for nausea or vomiting. 20 tablet 0  . potassium chloride (K-DUR,KLOR-CON) 10 MEQ tablet Take 10 mEq by mouth 2 (two) times daily.    . silver sulfADIAZINE (SILVADENE) 1 % cream Apply 1 application topically daily. 50 g 0  . traZODone (DESYREL) 50 MG tablet Take 50 mg by mouth at bedtime.    . triamcinolone (NASACORT ALLERGY 24HR) 55 MCG/ACT AERO nasal inhaler Place 2 sprays into the nose daily as needed.    . warfarin (COUMADIN) 2.5 MG tablet TAKE 1 TO 1&1/2 TABLETS DAILY AS DIRECTED BY COUMADIN CLINIC. 120 tablet 1   No current facility-administered medications for this visit.     Allergies:   Codeine; Cymbalta [duloxetine hcl]; Diltiazem cd [diltiazem hcl er beads]; Metoprolol; Nortriptyline; Penicillins; Statins; Tetanus toxoid adsorbed; Tetanus toxoids; Ciprofloxacin; Lyrica [pregabalin]; and Penicillin g    Social History:  The patient  reports that she quit smoking about 31 years ago. Her smoking use included cigarettes. She has a 7.50 pack-year smoking history. She has never used smokeless tobacco. She reports that she drinks alcohol. She reports that she does not use drugs.   Family History:  The patient's family history includes AAA (abdominal aortic aneurysm) in her mother; Aneurysm in her mother; Diabetes in her paternal  grandfather and son; Heart attack in her brother; Heart attack (age of onset: 51) in her father; Heart disease in her brother, father, and mother; Hyperlipidemia in her brother; Hypertension in her brother, brother, father, and mother; Peripheral vascular disease in her father; Stroke in her mother.    ROS:  Please see the history of present illness.   Otherwise, review of systems are positive for none.   All other systems are reviewed and negative.    PHYSICAL EXAM: VS:  BP 134/70   Pulse 90   Ht 5' 2"  (1.575 m)   Wt 225 lb 6.4 oz (102.2 kg)   BMI 41.23 kg/m  , BMI Body mass index is 41.23 kg/m. GENERAL:  Well appearing, morbidly obese WF in NAD. Walks with a cane. HEENT:  PERRL, EOMI, sclera are clear. Oropharynx is clear. NECK:  No jugular venous distention, carotid upstroke brisk and symmetric, no bruits, no thyromegaly or adenopathy LUNGS:  Clear to auscultation bilaterally CHEST:  Unremarkable HEART:  IRRR,  PMI not displaced or sustained,S1 and S2 within normal limits, no S3, no S4: no clicks, no rubs, no murmurs ABD:  Soft, nontender. BS +, no masses or bruits. No hepatomegaly, no splenomegaly EXT:  2 + pulses throughout, Tr edema- wearing support hose., no cyanosis no clubbing SKIN:  Warm and dry.  No rashes NEURO:  Alert and oriented x 3. Cranial nerves II through XII intact. PSYCH:  Cognitively intact     EKG:  EKG is not ordered today.    Recent Labs: Labs dated 05/01/15: cholesterol 153, triglycerides 384, HDL 34, LDL 42. Glucose 150. CMET and CBC otherwise normal. Dated 05/15/16: cholesterol 129, triglycerides 270, HDL 33, LDL 42. A1c 5.9%.  Dated 10/17/16: normal CBC. Creatinine 1.1. Other chemistries normal.  Dated 04/22/17: Hbg 11.9. Creatinine 0.9. Other chemistries normal.    Wt Readings from Last 3 Encounters:  05/27/17 225 lb 6.4 oz (102.2 kg)  05/06/17 222 lb (100.7 kg)  11/26/16 223 lb (101.2 kg)        ASSESSMENT AND PLAN:  1.  Permanent atrial  fibrillation on long-term anticoagulation. She is asymptomatic with good rate control. On long term anticoagulation with Coumadin. Will check INR today 2. History of pulmonary emboli, on warfarin 3. Essential hypertension with intolerance to metoprolol. Currently well controlled.  4. Chronic diastolic CHF. She is taking lasix 10 mg daily now with good volume control.   Sodium restriction.  5. Morbid obesity.  6. Carotid arterial disease. Occluded RCA. 84-66% LICA. Followed by [facility]. Will now be checking doppler every 6 months.   Current medicines are reviewed at length with the patient today.  The patient does not have concerns regarding medicines.  The following changes have been made:  no change  Labs/ tests ordered today include:  No orders of the defined types were placed in this encounter.    Disposition: as noted above.   Follow up in 6 months.  Signed, Keionna Kinnaird Martinique MD, [facility]    05/27/2017 2:49 PM     Phone: [phone]; Fax: [phone]

## 2017-05-26 DIAGNOSIS — H33012 Retinal detachment with single break, left eye: Secondary | ICD-10-CM | POA: Diagnosis not present

## 2017-05-26 DIAGNOSIS — H35352 Cystoid macular degeneration, left eye: Secondary | ICD-10-CM | POA: Diagnosis not present

## 2017-05-26 DIAGNOSIS — H33059 Total retinal detachment, unspecified eye: Secondary | ICD-10-CM | POA: Diagnosis not present

## 2017-05-26 DIAGNOSIS — H43812 Vitreous degeneration, left eye: Secondary | ICD-10-CM | POA: Diagnosis not present

## 2017-05-27 ENCOUNTER — Encounter: Payer: Self-pay | Admitting: Cardiology

## 2017-05-27 ENCOUNTER — Ambulatory Visit (INDEPENDENT_AMBULATORY_CARE_PROVIDER_SITE_OTHER): Payer: Medicare Other | Admitting: Cardiology

## 2017-05-27 ENCOUNTER — Ambulatory Visit (INDEPENDENT_AMBULATORY_CARE_PROVIDER_SITE_OTHER): Payer: Medicare Other | Admitting: Pharmacist

## 2017-05-27 VITALS — BP 134/70 | HR 90 | Ht 62.0 in | Wt 225.4 lb

## 2017-05-27 DIAGNOSIS — I5032 Chronic diastolic (congestive) heart failure: Secondary | ICD-10-CM | POA: Diagnosis not present

## 2017-05-27 DIAGNOSIS — I6523 Occlusion and stenosis of bilateral carotid arteries: Secondary | ICD-10-CM | POA: Diagnosis not present

## 2017-05-27 DIAGNOSIS — I4891 Unspecified atrial fibrillation: Secondary | ICD-10-CM

## 2017-05-27 DIAGNOSIS — I482 Chronic atrial fibrillation, unspecified: Secondary | ICD-10-CM

## 2017-05-27 DIAGNOSIS — E785 Hyperlipidemia, unspecified: Secondary | ICD-10-CM

## 2017-05-27 LAB — POCT INR: INR: 2.6

## 2017-05-27 NOTE — Patient Instructions (Signed)
Continue your current therapy  I will see you in 6 months.   

## 2017-05-28 DIAGNOSIS — E119 Type 2 diabetes mellitus without complications: Secondary | ICD-10-CM | POA: Diagnosis not present

## 2017-05-28 DIAGNOSIS — H401113 Primary open-angle glaucoma, right eye, severe stage: Secondary | ICD-10-CM | POA: Diagnosis not present

## 2017-05-28 DIAGNOSIS — Z961 Presence of intraocular lens: Secondary | ICD-10-CM | POA: Diagnosis not present

## 2017-05-29 ENCOUNTER — Ambulatory Visit (INDEPENDENT_AMBULATORY_CARE_PROVIDER_SITE_OTHER): Payer: Medicare Other | Admitting: Podiatry

## 2017-05-29 ENCOUNTER — Ambulatory Visit: Payer: Medicare Other | Admitting: Orthotics

## 2017-05-29 DIAGNOSIS — L97529 Non-pressure chronic ulcer of other part of left foot with unspecified severity: Secondary | ICD-10-CM | POA: Diagnosis not present

## 2017-05-29 DIAGNOSIS — G609 Hereditary and idiopathic neuropathy, unspecified: Secondary | ICD-10-CM

## 2017-05-29 NOTE — Progress Notes (Signed)
Order shoe for her Orthofeet [id number]

## 2017-06-03 NOTE — Progress Notes (Signed)
Subjective: 83 year old female presents the office today for follow-up evaluation of ulceration left foot.  Since I last saw her she states that she did fall and twist her ankle and because of this she was seen at [facility].  She states that she has had x-rays taken that were negative for fracture however she is having difficulty with weight to her foot but over the last several days she is able to walk more.  However since she fell in the surgical shoe she has gone back to wearing a regular shoe.  She states that the wound continues but denies any drainage or pus or any increase in redness and some redness and swelling has improved. Denies any systemic complaints such as fevers, chills, nausea, vomiting. No acute changes since last appointment, and no other complaints at this time.   Objective: AAO x3, NAD DP/PT pulses palpable bilaterally, CRT less than 3 seconds On the left foot submetatarsal one is a granular ulceration with surrounding hyperkeratotic periwound.  After debridement the wound measures 0.5 x 0.5 cm and is superficial without any probing to bone, undermining or tunneling.  There is a faint rim of erythema gently along the ulceration but there is no ascending cellulitis or increase in warmth and this appears to be more from inflammation as opposed to infection.  There is no fluctuation or crepitation.  There is no malodor. No pain with calf compression, swelling, warmth, erythema  Assessment: Ulceration left foot  Plan: -All treatment options discussed with the patient including all alternatives, risks, complications.  -Sharply debrided the wound today without any complications or bleeding to healthy, granular tissue.  Continue with daily dressing changes.  Also continue offloading at all times.  I would like for her to be in surgical shoe or cam boot but she does not feel comfortable in the surgical shoe.  I try to put her to a cam boot today but she did not want to wear  this.  We did modify her orthotic to help take pressure off this area.  If there is any increase in the size of the wound she is about the surgical shoe. -Monitor for any clinical signs or symptoms of infection and directed to call the office immediately should any occur or go to the ER. -Patient encouraged to call the office with any questions, concerns, change in symptoms.   Trula Slade DPM

## 2017-06-12 ENCOUNTER — Encounter: Payer: Self-pay | Admitting: Podiatry

## 2017-06-12 ENCOUNTER — Ambulatory Visit (INDEPENDENT_AMBULATORY_CARE_PROVIDER_SITE_OTHER): Payer: Medicare Other | Admitting: Podiatry

## 2017-06-12 DIAGNOSIS — L97529 Non-pressure chronic ulcer of other part of left foot with unspecified severity: Secondary | ICD-10-CM

## 2017-06-12 DIAGNOSIS — M79675 Pain in left toe(s): Secondary | ICD-10-CM | POA: Diagnosis not present

## 2017-06-12 DIAGNOSIS — B351 Tinea unguium: Secondary | ICD-10-CM | POA: Diagnosis not present

## 2017-06-12 DIAGNOSIS — M79674 Pain in right toe(s): Secondary | ICD-10-CM | POA: Diagnosis not present

## 2017-06-16 NOTE — Progress Notes (Signed)
Subjective: 83 year old female presents the office today for follow-up evaluation of ulceration left foot.  She states overall the wound is doing better.  She has been cleaning the area she has been  change the bandage daily.  She denies any drainage or pus or any redness or swelling.  She is been wearing a regular shoe.  She is also asking for the nails be trimmed today as are thick, discolored, elongated.  Denies any redness or drainage of the toenail sites and she has no other concerns today. Denies any systemic complaints such as fevers, chills, nausea, vomiting. No acute changes since last appointment, and no other complaints at this time.   Objective: AAO x3, NAD DP/PT pulses palpable bilaterally, CRT less than 3 seconds On the left foot submetatarsal one is a granular ulceration with surrounding hyperkeratotic periwound.  After debridement the wound measures 0.5 x 0.1 cm and is superficial without any probing to bone, undermining or tunneling.  There is no surrounding erythema, ascending sialitis.  There is no fluctuation or crepitation or malodor.  Overall mood appears to be much improved.  No swelling to the toe.  Nails are very hypertrophic, dystrophic, discolored with yellow-brown discoloration.  No surrounding redness or drainage.  Tenderness nails 1-5 bilaterally.  Assessment: Ulceration left foot, symptomatic onychomycosis  Plan: -All treatment options discussed with the patient including all alternatives, risks, complications.  -Sharply debrided the wound today without any complications or bleeding to healthy, granular tissue.  Continue with daily dressing changes.  Also continue offloading at all times.  Overall the wound is improving.  Continue to monitor any signs or symptoms of infection -Nails debrided x10 without any complications or bleeding -Patient encouraged to call the office with any questions, concerns, change in symptoms.   Trula Slade DPM

## 2017-06-18 DIAGNOSIS — I10 Essential (primary) hypertension: Secondary | ICD-10-CM | POA: Diagnosis not present

## 2017-06-18 DIAGNOSIS — R600 Localized edema: Secondary | ICD-10-CM | POA: Diagnosis not present

## 2017-06-18 DIAGNOSIS — N390 Urinary tract infection, site not specified: Secondary | ICD-10-CM | POA: Diagnosis not present

## 2017-06-23 DIAGNOSIS — I4891 Unspecified atrial fibrillation: Secondary | ICD-10-CM | POA: Diagnosis not present

## 2017-06-23 DIAGNOSIS — I509 Heart failure, unspecified: Secondary | ICD-10-CM | POA: Diagnosis not present

## 2017-06-23 DIAGNOSIS — M8580 Other specified disorders of bone density and structure, unspecified site: Secondary | ICD-10-CM | POA: Diagnosis not present

## 2017-06-23 DIAGNOSIS — K5090 Crohn's disease, unspecified, without complications: Secondary | ICD-10-CM | POA: Diagnosis not present

## 2017-06-23 DIAGNOSIS — M545 Low back pain: Secondary | ICD-10-CM | POA: Diagnosis not present

## 2017-06-23 DIAGNOSIS — I6523 Occlusion and stenosis of bilateral carotid arteries: Secondary | ICD-10-CM | POA: Diagnosis not present

## 2017-06-23 DIAGNOSIS — I10 Essential (primary) hypertension: Secondary | ICD-10-CM | POA: Diagnosis not present

## 2017-06-23 DIAGNOSIS — Z0000 Encounter for general adult medical examination without abnormal findings: Secondary | ICD-10-CM | POA: Diagnosis not present

## 2017-06-23 DIAGNOSIS — E785 Hyperlipidemia, unspecified: Secondary | ICD-10-CM | POA: Diagnosis not present

## 2017-06-23 DIAGNOSIS — K219 Gastro-esophageal reflux disease without esophagitis: Secondary | ICD-10-CM | POA: Diagnosis not present

## 2017-06-23 DIAGNOSIS — F5104 Psychophysiologic insomnia: Secondary | ICD-10-CM | POA: Diagnosis not present

## 2017-06-23 DIAGNOSIS — Z6841 Body Mass Index (BMI) 40.0 and over, adult: Secondary | ICD-10-CM | POA: Diagnosis not present

## 2017-06-24 ENCOUNTER — Ambulatory Visit (INDEPENDENT_AMBULATORY_CARE_PROVIDER_SITE_OTHER): Payer: Medicare Other | Admitting: Pharmacist Clinician (PhC)/ Clinical Pharmacy Specialist

## 2017-06-24 DIAGNOSIS — Z7901 Long term (current) use of anticoagulants: Secondary | ICD-10-CM

## 2017-06-24 DIAGNOSIS — I4891 Unspecified atrial fibrillation: Secondary | ICD-10-CM

## 2017-06-24 LAB — POCT INR: INR: 2.4

## 2017-06-24 NOTE — Patient Instructions (Signed)
Description   Continue with 1 tablet daily except 1.5 tablet each Monday, Wednesday and Friday, repeat INR in 4 weeks

## 2017-06-26 ENCOUNTER — Ambulatory Visit (INDEPENDENT_AMBULATORY_CARE_PROVIDER_SITE_OTHER): Payer: Medicare Other | Admitting: Podiatry

## 2017-06-26 DIAGNOSIS — L97521 Non-pressure chronic ulcer of other part of left foot limited to breakdown of skin: Secondary | ICD-10-CM

## 2017-06-26 NOTE — Progress Notes (Signed)
Subjective: 83 year old female presents the office today for follow-up evaluation of ulceration left foot. She states that she is doing well. She has been continue with daily dressing changes.  She reports only a small amount of blood on the bandage but denies any drainage or pus and denies any swelling or redness to her toe.  Overall she states that she is doing well.  She has no new concerns today.  Denies any fevers, chills, nausea, vomiting, chest pain, shortness of breath or calf pain.   Objective: AAO x3, NAD DP/PT pulses palpable bilaterally, CRT less than 3 seconds On the left foot submetatarsal one is a granular ulceration with surrounding hyperkeratotic periwound.  After debridement the wound measures 0.3 x 0.1 cm and is superficial without any probing to bone, undermining or tunneling.  It appears to be more of a superficial abrasion type lesion today.  There is no surrounding erythema, ascending cellulitis.  There is no fluctuation or crepitation or malodor.  No swelling to the toe.  No other open lesions or pre-ulcerative lesions. No pain with calf compression, swelling, warmth, erythema.  Assessment: Ulceration left foot  Plan: -All treatment options discussed with the patient including all alternatives, risks, complications.  -Today I was able to debride the wound today utilizing a #312 blade scalpel without any complications to healthy, granular tissue.  Continue with the antibiotic ointment daily.  Continue offloading at all times. -Monitor for any clinical signs or symptoms of infection and directed to call the office immediately should any occur or go to the ER. -RTC 2 weeks or sooner if needed.  Trula Slade DPM

## 2017-06-30 DIAGNOSIS — N393 Stress incontinence (female) (male): Secondary | ICD-10-CM | POA: Diagnosis not present

## 2017-06-30 DIAGNOSIS — Z86711 Personal history of pulmonary embolism: Secondary | ICD-10-CM | POA: Diagnosis not present

## 2017-06-30 DIAGNOSIS — N3020 Other chronic cystitis without hematuria: Secondary | ICD-10-CM | POA: Diagnosis not present

## 2017-07-01 DIAGNOSIS — E785 Hyperlipidemia, unspecified: Secondary | ICD-10-CM | POA: Diagnosis not present

## 2017-07-03 ENCOUNTER — Other Ambulatory Visit: Payer: Self-pay | Admitting: Cardiology

## 2017-07-04 NOTE — Telephone Encounter (Signed)
Rx request sent to pharmacy.  

## 2017-07-08 DIAGNOSIS — M4800 Spinal stenosis, site unspecified: Secondary | ICD-10-CM | POA: Diagnosis not present

## 2017-07-08 DIAGNOSIS — M150 Primary generalized (osteo)arthritis: Secondary | ICD-10-CM | POA: Diagnosis not present

## 2017-07-08 DIAGNOSIS — I509 Heart failure, unspecified: Secondary | ICD-10-CM | POA: Diagnosis not present

## 2017-07-08 DIAGNOSIS — I4891 Unspecified atrial fibrillation: Secondary | ICD-10-CM | POA: Diagnosis not present

## 2017-07-08 DIAGNOSIS — I110 Hypertensive heart disease with heart failure: Secondary | ICD-10-CM | POA: Diagnosis not present

## 2017-07-08 DIAGNOSIS — M797 Fibromyalgia: Secondary | ICD-10-CM | POA: Diagnosis not present

## 2017-07-10 ENCOUNTER — Encounter: Payer: Self-pay | Admitting: Podiatry

## 2017-07-10 ENCOUNTER — Ambulatory Visit (INDEPENDENT_AMBULATORY_CARE_PROVIDER_SITE_OTHER): Payer: Medicare Other | Admitting: Podiatry

## 2017-07-10 DIAGNOSIS — L97521 Non-pressure chronic ulcer of other part of left foot limited to breakdown of skin: Secondary | ICD-10-CM

## 2017-07-10 DIAGNOSIS — E785 Hyperlipidemia, unspecified: Secondary | ICD-10-CM | POA: Diagnosis not present

## 2017-07-10 DIAGNOSIS — I6523 Occlusion and stenosis of bilateral carotid arteries: Secondary | ICD-10-CM | POA: Diagnosis not present

## 2017-07-10 DIAGNOSIS — I10 Essential (primary) hypertension: Secondary | ICD-10-CM | POA: Diagnosis not present

## 2017-07-14 NOTE — Progress Notes (Signed)
Subjective: 83 year old female presents the office today for follow-up evaluation of ulceration left foot.  She states that she is doing very well she is only had a very minimal amount of bloody drainage on the bandage.  Denies any pus and denies any redness or drainage or any swelling.  Denies any red streaks.  She has no other concerns today. Denies any fevers, chills, nausea, vomiting, chest pain, shortness of breath or calf pain.   Objective: AAO x3, NAD DP/PT pulses palpable bilaterally, CRT less than 3 seconds On the left foot submetatarsal one is a granular ulceration with surrounding hyperkeratotic periwound.  After debridement the wound measures 0.2 x 0.1 cm and is superficial without any probing to bone, undermining or tunneling.  There is some macerated tissue overlying the area.  It continues to be more of a superficial abrasion type lesion today.  There is no surrounding erythema, ascending cellulitis.  There is no fluctuation or crepitation or malodor.  No swelling to the toe.  No other open lesions or pre-ulcerative lesions. No pain with calf compression, swelling, warmth, erythema.  Assessment: Ulceration left foot  Plan: -All treatment options discussed with the patient including all alternatives, risks, complications.  -Today utilizing #312 blade scalpel I did sharply debride the wound and to remove some of the macerated tissue around the ulcer.  Appears to be almost healed at this time.  Continue with daily dressing changes.  Continue offloading at all times. -Monitor for any clinical signs or symptoms of infection and directed to call the office immediately should any occur or go to the ER. -RTC 2 weeks or sooner if needed.  Trula Slade DPM

## 2017-07-16 DIAGNOSIS — M150 Primary generalized (osteo)arthritis: Secondary | ICD-10-CM | POA: Diagnosis not present

## 2017-07-16 DIAGNOSIS — M4800 Spinal stenosis, site unspecified: Secondary | ICD-10-CM | POA: Diagnosis not present

## 2017-07-16 DIAGNOSIS — I110 Hypertensive heart disease with heart failure: Secondary | ICD-10-CM | POA: Diagnosis not present

## 2017-07-16 DIAGNOSIS — I509 Heart failure, unspecified: Secondary | ICD-10-CM | POA: Diagnosis not present

## 2017-07-16 DIAGNOSIS — I4891 Unspecified atrial fibrillation: Secondary | ICD-10-CM | POA: Diagnosis not present

## 2017-07-16 DIAGNOSIS — M797 Fibromyalgia: Secondary | ICD-10-CM | POA: Diagnosis not present

## 2017-07-17 DIAGNOSIS — I110 Hypertensive heart disease with heart failure: Secondary | ICD-10-CM | POA: Diagnosis not present

## 2017-07-17 DIAGNOSIS — M4800 Spinal stenosis, site unspecified: Secondary | ICD-10-CM | POA: Diagnosis not present

## 2017-07-17 DIAGNOSIS — I4891 Unspecified atrial fibrillation: Secondary | ICD-10-CM | POA: Diagnosis not present

## 2017-07-17 DIAGNOSIS — M797 Fibromyalgia: Secondary | ICD-10-CM | POA: Diagnosis not present

## 2017-07-17 DIAGNOSIS — I509 Heart failure, unspecified: Secondary | ICD-10-CM | POA: Diagnosis not present

## 2017-07-17 DIAGNOSIS — M150 Primary generalized (osteo)arthritis: Secondary | ICD-10-CM | POA: Diagnosis not present

## 2017-07-21 ENCOUNTER — Ambulatory Visit (INDEPENDENT_AMBULATORY_CARE_PROVIDER_SITE_OTHER): Payer: Medicare Other | Admitting: Podiatry

## 2017-07-21 ENCOUNTER — Ambulatory Visit (INDEPENDENT_AMBULATORY_CARE_PROVIDER_SITE_OTHER): Payer: Medicare Other | Admitting: Pharmacist Clinician (PhC)/ Clinical Pharmacy Specialist

## 2017-07-21 ENCOUNTER — Encounter: Payer: Self-pay | Admitting: Podiatry

## 2017-07-21 DIAGNOSIS — L97521 Non-pressure chronic ulcer of other part of left foot limited to breakdown of skin: Secondary | ICD-10-CM

## 2017-07-21 DIAGNOSIS — Z7901 Long term (current) use of anticoagulants: Secondary | ICD-10-CM

## 2017-07-21 DIAGNOSIS — M961 Postlaminectomy syndrome, not elsewhere classified: Secondary | ICD-10-CM | POA: Diagnosis not present

## 2017-07-21 DIAGNOSIS — M545 Low back pain: Secondary | ICD-10-CM | POA: Diagnosis not present

## 2017-07-21 DIAGNOSIS — I4891 Unspecified atrial fibrillation: Secondary | ICD-10-CM

## 2017-07-21 DIAGNOSIS — M5136 Other intervertebral disc degeneration, lumbar region: Secondary | ICD-10-CM | POA: Diagnosis not present

## 2017-07-21 DIAGNOSIS — I6523 Occlusion and stenosis of bilateral carotid arteries: Secondary | ICD-10-CM | POA: Diagnosis not present

## 2017-07-21 DIAGNOSIS — G8929 Other chronic pain: Secondary | ICD-10-CM | POA: Insufficient documentation

## 2017-07-21 DIAGNOSIS — M1711 Unilateral primary osteoarthritis, right knee: Secondary | ICD-10-CM | POA: Diagnosis not present

## 2017-07-21 DIAGNOSIS — G894 Chronic pain syndrome: Secondary | ICD-10-CM | POA: Diagnosis not present

## 2017-07-21 LAB — POCT INR: INR: 2.3 (ref 2.0–3.0)

## 2017-07-21 NOTE — Patient Instructions (Signed)
Description   Continue with 1 tablet daily except 1.5 tablet each Monday, Wednesday and Friday, repeat INR in 4 weeks

## 2017-07-22 DIAGNOSIS — M797 Fibromyalgia: Secondary | ICD-10-CM | POA: Diagnosis not present

## 2017-07-22 DIAGNOSIS — I4891 Unspecified atrial fibrillation: Secondary | ICD-10-CM | POA: Diagnosis not present

## 2017-07-22 DIAGNOSIS — I509 Heart failure, unspecified: Secondary | ICD-10-CM | POA: Diagnosis not present

## 2017-07-22 DIAGNOSIS — I110 Hypertensive heart disease with heart failure: Secondary | ICD-10-CM | POA: Diagnosis not present

## 2017-07-22 DIAGNOSIS — M150 Primary generalized (osteo)arthritis: Secondary | ICD-10-CM | POA: Diagnosis not present

## 2017-07-22 DIAGNOSIS — M4800 Spinal stenosis, site unspecified: Secondary | ICD-10-CM | POA: Diagnosis not present

## 2017-07-22 NOTE — Progress Notes (Signed)
Subjective: 83 year old female presents the office today for follow-up evaluation of ulceration left foot.  She states that she is doing better.  They have noticed a blister or something form around the wound.  Denies any pus.  She occasionally has some bloody drainage from the area but denies any increase in swelling or redness.  She is been keeping Betadine on the area daily.  The area is been drying out.  No other concerns. Denies any fevers, chills, nausea, vomiting, chest pain, shortness of breath or calf pain.   Objective: AAO x3, NAD DP/PT pulses palpable bilaterally, CRT less than 3 seconds On the left foot submetatarsal 1 there is an ulceration which is starting to scab over.  There is no drainage or pus identified today but does appear to be an old blister on the periphery of the area.  Is able to debride this and there is new, pink skin present.  It appears that the wound has started to scab over and dry and there is no definite open sore identified to the area today.  There is no surrounding erythema, ascending sialitis.  There is no fluctuation or crepitation or any malodor. No other open lesions or pre-ulcerative lesions. No pain with calf compression, swelling, warmth, erythema.  Assessment: Ulceration left foot  Plan: -All treatment options discussed with the patient including all alternatives, risks, complications.  -I debrided the hyperkeratotic lesion and to remove the old blister to reveal underlying new, healthy skin present.  Continue with daily dressing changes.  She can continue Betadine and alternate with Silvadene if needed.  Betadine seems to be drying the area and allow the wound to close.  Continue offloading at all times. -Monitor for any clinical signs or symptoms of infection and directed to call the office immediately should any occur or go to the ER.  Return in about 2 weeks (around 08/04/2017).  Trula Slade DPM

## 2017-07-24 DIAGNOSIS — I110 Hypertensive heart disease with heart failure: Secondary | ICD-10-CM | POA: Diagnosis not present

## 2017-07-24 DIAGNOSIS — I4891 Unspecified atrial fibrillation: Secondary | ICD-10-CM | POA: Diagnosis not present

## 2017-07-24 DIAGNOSIS — I509 Heart failure, unspecified: Secondary | ICD-10-CM | POA: Diagnosis not present

## 2017-07-24 DIAGNOSIS — M4800 Spinal stenosis, site unspecified: Secondary | ICD-10-CM | POA: Diagnosis not present

## 2017-07-24 DIAGNOSIS — M797 Fibromyalgia: Secondary | ICD-10-CM | POA: Diagnosis not present

## 2017-07-24 DIAGNOSIS — M150 Primary generalized (osteo)arthritis: Secondary | ICD-10-CM | POA: Diagnosis not present

## 2017-07-28 DIAGNOSIS — I110 Hypertensive heart disease with heart failure: Secondary | ICD-10-CM | POA: Diagnosis not present

## 2017-07-28 DIAGNOSIS — M4800 Spinal stenosis, site unspecified: Secondary | ICD-10-CM | POA: Diagnosis not present

## 2017-07-28 DIAGNOSIS — M797 Fibromyalgia: Secondary | ICD-10-CM | POA: Diagnosis not present

## 2017-07-28 DIAGNOSIS — I509 Heart failure, unspecified: Secondary | ICD-10-CM | POA: Diagnosis not present

## 2017-07-28 DIAGNOSIS — I4891 Unspecified atrial fibrillation: Secondary | ICD-10-CM | POA: Diagnosis not present

## 2017-07-28 DIAGNOSIS — M150 Primary generalized (osteo)arthritis: Secondary | ICD-10-CM | POA: Diagnosis not present

## 2017-07-30 DIAGNOSIS — I110 Hypertensive heart disease with heart failure: Secondary | ICD-10-CM | POA: Diagnosis not present

## 2017-07-30 DIAGNOSIS — M4800 Spinal stenosis, site unspecified: Secondary | ICD-10-CM | POA: Diagnosis not present

## 2017-07-30 DIAGNOSIS — M150 Primary generalized (osteo)arthritis: Secondary | ICD-10-CM | POA: Diagnosis not present

## 2017-07-30 DIAGNOSIS — I509 Heart failure, unspecified: Secondary | ICD-10-CM | POA: Diagnosis not present

## 2017-07-30 DIAGNOSIS — I4891 Unspecified atrial fibrillation: Secondary | ICD-10-CM | POA: Diagnosis not present

## 2017-07-30 DIAGNOSIS — M797 Fibromyalgia: Secondary | ICD-10-CM | POA: Diagnosis not present

## 2017-08-01 DIAGNOSIS — I10 Essential (primary) hypertension: Secondary | ICD-10-CM | POA: Diagnosis not present

## 2017-08-01 DIAGNOSIS — E785 Hyperlipidemia, unspecified: Secondary | ICD-10-CM | POA: Diagnosis not present

## 2017-08-04 DIAGNOSIS — M797 Fibromyalgia: Secondary | ICD-10-CM | POA: Diagnosis not present

## 2017-08-04 DIAGNOSIS — I110 Hypertensive heart disease with heart failure: Secondary | ICD-10-CM | POA: Diagnosis not present

## 2017-08-04 DIAGNOSIS — M150 Primary generalized (osteo)arthritis: Secondary | ICD-10-CM | POA: Diagnosis not present

## 2017-08-04 DIAGNOSIS — I4891 Unspecified atrial fibrillation: Secondary | ICD-10-CM | POA: Diagnosis not present

## 2017-08-04 DIAGNOSIS — I509 Heart failure, unspecified: Secondary | ICD-10-CM | POA: Diagnosis not present

## 2017-08-04 DIAGNOSIS — M4800 Spinal stenosis, site unspecified: Secondary | ICD-10-CM | POA: Diagnosis not present

## 2017-08-05 ENCOUNTER — Ambulatory Visit (INDEPENDENT_AMBULATORY_CARE_PROVIDER_SITE_OTHER): Payer: Medicare Other | Admitting: Podiatry

## 2017-08-05 ENCOUNTER — Telehealth: Payer: Self-pay | Admitting: Cardiology

## 2017-08-05 DIAGNOSIS — L97529 Non-pressure chronic ulcer of other part of left foot with unspecified severity: Secondary | ICD-10-CM | POA: Diagnosis not present

## 2017-08-05 DIAGNOSIS — I6523 Occlusion and stenosis of bilateral carotid arteries: Secondary | ICD-10-CM | POA: Diagnosis not present

## 2017-08-05 DIAGNOSIS — L03116 Cellulitis of left lower limb: Secondary | ICD-10-CM

## 2017-08-05 MED ORDER — CEPHALEXIN 500 MG PO CAPS: 500.0000 mg | ORAL_CAPSULE | Freq: Three times a day (TID) | ORAL | 0 refills | Status: DC

## 2017-08-05 NOTE — Telephone Encounter (Signed)
New Message:       Pt c/o swelling: STAT is pt has developed SOB within 24 hours  1) How much weight have you gained and in what time span? 3 lbs/ 1 week  2) If swelling, where is the swelling located? Leg and top of the foot  3) Are you currently taking a fluid pill? Yes   4) Are you currently SOB? yes  5) Do you have a log of your daily weights (if so, list)? No  6) Have you gained 3 pounds in a day or 5 pounds in a week? 3 lbs  7) Have you traveled recently? No    Pt states her legs are red but not hot.

## 2017-08-05 NOTE — Telephone Encounter (Signed)
Spoke with pt who states since the middle of last week she has been experiencing bilateral leg edema in calf but more so on the right side. She also reports SOB with exertion and states yesterday the swelling increased. She reports she also had a 3 lb weight gain since mon and states she normally take 10 mg of lasix but 20 mg this morning. Pt states she had a PE in the past and concerned that she may have a DVT but denies any redness or warm to touch in the calf area. Routing to Dr. Martinique for recommendation. Also scheduled an appointment with Rosaria Ferries, PA on 08/07/17 at 11 am for pt to be seen.

## 2017-08-05 NOTE — Telephone Encounter (Signed)
Spoke with pt and advised of Dr. Doug Sou recommendation. Pt verbalized understanding.

## 2017-08-05 NOTE — Telephone Encounter (Signed)
Agree with increase lasix for swelling. She is on coumadin and INR has been therapeutic so I think DVT is less likely. Keep follow up with Rhonda  Mabelle Mungin Martinique MD, [facility]

## 2017-08-06 NOTE — Progress Notes (Signed)
Subjective: 83 year old female presents the office today for follow-up evaluation of ulceration left foot. she states that she has been doing well she is having no issues to the area up until today when getting ready to come to the appointment she did notice some blood coming from the area.  She states that she Has been off of her diuretic and she is having swelling.  She denies any fevers, chills, nausea, vomiting.  Denies any calf pain, chest pain, shortness of breath.  She has no other concerns today.  Objective: AAO x3, NAD DP/PT pulses palpable bilaterally, CRT less than 3 seconds On the left foot submetatarsal 1 there is a hyperkeratotic lesion.  There does appear that there has been a blister Thana Farr form around the area.  Upon debridement the wound does measure about 0.  2 x 0.2 cm and almost superficial type of wound but there is no probing, undermining or tunneling.  Upon initial debridement there was some clear to serosanguineous drainage expressed but there is no purulence.  After debridement there is no further drainage identified.  There is mild increase in swelling to the left foot and there is some faint erythema to the dorsal aspect the foot but there is no other open lesions identified there is no fluctuation or crepitation.  No other open lesions or pre-ulcerative lesions are identified today. No pain with calf compression, swelling, warmth, erythema.  Assessment: Ulceration submetatarsal 1 left foot with increasing swelling erythema  Plan: -All treatment options discussed with the patient including all alternatives, risks, complications.  I debrided the hyperkeratotic lesion in the wound to healthy wound base.  I debrided all nonviable tissue as well as what appeared to be the old blister that has dried up.  I cleaned the area with alcohol and utilizing #312 blade scalpel for this.  On her continue small amount of Silvadene to the wound daily followed by dressing.  Continue  offloading at all times.  We are going to start antibiotics I prescribed Keflex for her given the swelling and erythema.  There is any worsening she is to the emergency room.  Return in about 2 weeks (around 08/19/2017).  Trula Slade DPM

## 2017-08-07 ENCOUNTER — Ambulatory Visit (INDEPENDENT_AMBULATORY_CARE_PROVIDER_SITE_OTHER): Payer: Medicare Other | Admitting: Physician Assistant

## 2017-08-07 ENCOUNTER — Encounter: Payer: Self-pay | Admitting: Physician Assistant

## 2017-08-07 ENCOUNTER — Other Ambulatory Visit: Payer: Self-pay | Admitting: Cardiology

## 2017-08-07 VITALS — BP 132/84 | HR 100 | Ht 62.0 in | Wt 221.4 lb

## 2017-08-07 DIAGNOSIS — Z789 Other specified health status: Secondary | ICD-10-CM | POA: Diagnosis not present

## 2017-08-07 DIAGNOSIS — I482 Chronic atrial fibrillation, unspecified: Secondary | ICD-10-CM

## 2017-08-07 DIAGNOSIS — Z7901 Long term (current) use of anticoagulants: Secondary | ICD-10-CM

## 2017-08-07 DIAGNOSIS — I5032 Chronic diastolic (congestive) heart failure: Secondary | ICD-10-CM | POA: Diagnosis not present

## 2017-08-07 DIAGNOSIS — I6523 Occlusion and stenosis of bilateral carotid arteries: Secondary | ICD-10-CM

## 2017-08-07 DIAGNOSIS — E785 Hyperlipidemia, unspecified: Secondary | ICD-10-CM | POA: Diagnosis not present

## 2017-08-07 DIAGNOSIS — I10 Essential (primary) hypertension: Secondary | ICD-10-CM | POA: Diagnosis not present

## 2017-08-07 NOTE — Progress Notes (Signed)
Cardiology Office Note   Date:  08/07/2017   ID:  [name], DOB February 27, 1934, MRN [id number]  PCP:  Deland Pretty, MD  Cardiologist: Dr. Martinique, 05/27/2017 Rosaria Ferries, PA-C    History of Present Illness: [name] is a 83 y.o. female with a history of PE, perm Afib on coumadin, HTN, R-ICA 100%, 60-79% L-ICA, no sig CAD at cath 1994, MV 2011 ok w/ EF 78%, Crohn's dz, OSA on CPAP, GERD, OA, intol metoprolol   4/16 office visit, patient stable, weight 225 pounds, no med changes 6/25 phone notes regarding weight gain and increased lower extremity edema, appt made 6/25 visit to podiatrist, wound treated and patient put on [place] presents for cardiology follow up.  She feels better today.  Usually at home, her weight is 220-222 lbs. Early this week, she was having trouble sleeping, even with the CPAP. Her weight was up 3 lbs. She took 20 mg Lasix the next day and felt better within 24 hours.   Her calves have been swelling, R>L. She normally wears support stockings, but has not been able to wear them because of a wound on the bottom of her L foot. This is being treated by a podiatrist.   Her husband was able to get the support stockings on, the last 2 days.   She is still seeing PT 2 x week. She is working on [employer].  They mostly eat out, or eat prepared foods. Looking up some dishes, they have between 1000 and 2000 mg sodium per serving.    Past Medical History:  Diagnosis Date  . Atrial fibrillation ([place])   . Carotid artery occlusion   . Chronic diastolic CHF (congestive heart failure) (HCC)    Hypertensive heart disease 02-12-14  . Crohn's disease ([place])   . Detached retina   . Fibromyalgia   . GERD (gastroesophageal reflux disease)   . Gout   . H/O hiatal hernia   . High triglycerides   . History of stomach ulcers 1950's  . Hypertension   . Long term current use of anticoagulant   . Obstructive sleep apnea on CPAP   .  Osteoarthritis    PAIN AND OA LEF T HIP AND BOTH SHOULDERS ARE BONE ON BONE AND PAINFUL  . Peripheral vascular disease (HCC)    KNOWN RIGHT INTERNAL CAROTID ARTERY OCCLUSION (NO STROKE)  --40 TO 59% STENOSIS LEFT ICA-FOLLOWED BY DR. EARLY WITH DOPPLER STUDY EVERY 6 MONTHS  . Pulmonary embolism ([place]) 2011   a. Hx of PE in 02/2009 after R hip surgery, venous dopplers negative, long-term Coumadin.  [name] Kitchen PVC (premature ventricular contraction)    PT STATES HX OF PVC'S ON EKG  . Recurrent upper respiratory infection (URI)    BRONCHITIS FEB 2013--SLIGHT COUGH NON-PRODUCTIVE NOW  . Recurrent UTI (urinary tract infection)    "on daily medicine" (05/14/2012)  . Tinnitus   . Vertigo     Past Surgical History:  Procedure Laterality Date  . APPENDECTOMY  1950's  . BACK SURGERY  10/29/06   Central and foraminal decompression L3-L4, L4-L5, and L5-S1 with inspection of L4-L5 and L5-S1 disc on the right  . CARDIAC CATHETERIZATION  1970's   "maybe 2" (05/14/2012)  . CARDIAC CATHETERIZATION    . CATARACT EXTRACTION W/ INTRAOCULAR LENS IMPLANT Right 2009  . CHOLECYSTECTOMY  ~ 1988  . DECOMPRESSIVE LUMBAR LAMINECTOMY LEVEL 3  ~ 2002  . DILATION AND CURETTAGE OF UTERUS     "  several; from miscarriages" (05/14/2012)  . EXCISION MORTON'S NEUROMA Right 05/20/00   "foot" (05/14/2012)  . EYE SURGERY Left 2014   Detached retina  . EYE SURGERY Left August 23, 2013   Cataract  . FEMUR FRACTURE SURGERY Right 02/21/09   Open reduction internal fixation of right periprosthetic  femur fracture utilizing Zimmer cables times fiv  . FRACTURE SURGERY  2011   Right Femur Fx  . HAMMER TOE SURGERY Left 10/25/08   "toe next to big to" (05/14/2012)  . JOINT REPLACEMENT  2009   Right Hip replacement  . JOINT REPLACEMENT  2012   Right knee replacement  . JOINT REPLACEMENT  06/17/11   Left Hip replacement  . KNEE ARTHROSCOPY Right 07/26/05  . REPLACEMENT TOTAL KNEE Right 02/19/2010  . SPINE SURGERY    . [place]?  . TOTAL HIP ARTHROPLASTY Right 12/08/02   Osteonics total hip replacement  . TOTAL HIP ARTHROPLASTY  06/17/2011   Procedure: TOTAL HIP ARTHROPLASTY;  Surgeon: Gearlean Alf, MD;  Location: [facility];  Service: Orthopedics;  Laterality: Left;  . TOTAL SHOULDER ARTHROPLASTY Left 05/14/2012  . TOTAL SHOULDER ARTHROPLASTY Left 05/14/2012   Procedure: TOTAL SHOULDER ARTHROPLASTY;  Surgeon: Marin Shutter, MD;  Location: [place];  Service: Orthopedics;  Laterality: Left;  [name] Kitchen VAGINAL HYSTERECTOMY  1971    Current Outpatient Medications  Medication Sig Dispense Refill  . allopurinol (ZYLOPRIM) 300 MG tablet Take 300 mg by mouth daily.    [name] Kitchen amLODipine (NORVASC) 2.5 MG tablet Take 2.5 mg by mouth daily.    . cephALEXin (KEFLEX) 500 MG capsule Take 1 capsule (500 mg total) by mouth 3 (three) times daily. 30 capsule 0  . cetirizine (ZYRTEC) 10 MG tablet Take 10 mg by mouth daily.    . clobetasol cream (TEMOVATE) 3.15 % Apply 1 application topically 2 (two) times daily.    [name] Kitchen CRANBERRY CONCENTRATE PO Take 1 tablet by mouth daily.    . dorzolamide-timolol (COSOPT) 22.3-6.8 MG/ML ophthalmic solution Place 1 drop into both eyes 2 (two) times daily.     . furosemide (LASIX) 20 MG tablet TAKE (1/2) TABLET DAILY. 45 tablet 0  . HYDROcodone-acetaminophen (NORCO) 10-325 MG per tablet Take 1 tablet by mouth every 6 (six) hours as needed (pain).     . indapamide (LOZOL) 2.5 MG tablet Take 2.5 mg by mouth daily.    [name] Kitchen ketoconazole (NIZORAL) 2 % cream Apply 1 application topically 2 (two) times daily as needed for irritation.     [name] Kitchen LUMIGAN 0.01 % SOLN Place 1 drop into both eyes at bedtime.    . mupirocin ointment (BACTROBAN) 2 % Apply 1 application topically 2 (two) times daily. 30 g 2  . nebivolol (BYSTOLIC) 10 MG tablet Take 1 tablet (10 mg total) by mouth at bedtime. 90 tablet 3  . NONFORMULARY OR COMPOUNDED ITEM [facility]  Peripheral Neuropathy Cream- Bupivacaine 1%, Doxepin 3%, Gabapentin 6%,  Pentoxifylline 3%, Topiramate 1% Apply 1-2 grams to affected area 3-4 times daily Qty. 120 gm 3 refills 1 each 0  . omeprazole (PRILOSEC) 40 MG capsule Take 40 mg by mouth daily.    . ondansetron (ZOFRAN ODT) 4 MG disintegrating tablet Take 1 tablet (4 mg total) by mouth every 8 (eight) hours as needed for nausea or vomiting. 20 tablet 0  . potassium chloride (K-DUR,KLOR-CON) 10 MEQ tablet Take 10 mEq by mouth 2 (two) times daily.    . silver sulfADIAZINE (SILVADENE) 1 % cream Apply  1 application topically daily. 50 g 0  . traZODone (DESYREL) 50 MG tablet Take 50 mg by mouth at bedtime.    . triamcinolone (NASACORT ALLERGY 24HR) 55 MCG/ACT AERO nasal inhaler Place 2 sprays into the nose daily as needed.    . warfarin (COUMADIN) 2.5 MG tablet TAKE 1 TO 1&1/2 TABLETS DAILY AS DIRECTED BY COUMADIN CLINIC. 120 tablet 1   No current facility-administered medications for this visit.     Allergies:   Codeine; Cymbalta [duloxetine hcl]; Diltiazem cd [diltiazem hcl er beads]; Metoprolol; Nortriptyline; Penicillins; Statins; Tetanus toxoid adsorbed; Tetanus toxoids; Ciprofloxacin; Lyrica [pregabalin]; and Penicillin g    Social History:  The patient  reports that she quit smoking about 31 years ago. Her smoking use included cigarettes. She has a 7.50 pack-year smoking history. She has never used smokeless tobacco. She reports that she drinks alcohol. She reports that she does not use drugs.   Family History:  The patient's family history includes AAA (abdominal aortic aneurysm) in her mother; Aneurysm in her mother; Diabetes in her paternal grandfather and son; Heart attack in her brother; Heart attack (age of onset: 68) in her father; Heart disease in her brother, father, and mother; Hyperlipidemia in her brother; Hypertension in her brother, brother, father, and mother; Peripheral vascular disease in her father; Stroke in her mother.    ROS:  Please see the history of present illness. All other  systems are reviewed and negative.    PHYSICAL EXAM: VS:  BP 132/84   Pulse 100   Ht 5' 2"  (1.575 m)   Wt 221 lb 6.4 oz (100.4 kg)   SpO2 96%   BMI 40.49 kg/m  , BMI Body mass index is 40.49 kg/m. GEN: Well nourished, well developed, female in no acute distress  HEENT: normal for age  Neck: Minimal JVD, faint left carotid bruit, no masses Cardiac: Irregular rate and rhythm; no murmur, no rubs, or gallops Respiratory: Decreased breath sounds bases with a few scattered rales bilaterally, normal work of breathing GI: soft, nontender, nondistended, + BS MS: no deformity or atrophy; +pedal edema; distal pulses are 2+ in upper extremities   Skin: warm and dry, no rash Neuro:  Strength and sensation are intact Psych: euthymic mood, full affect   EKG:  EKG is not ordered today.   Recent Labs: 09/11/2016: Magnesium 1.0 04/22/2017: ALT 12; BUN 21; Creat 0.90; Hemoglobin 11.9; Platelets 147; Potassium 3.8; Sodium 145    Lipid Panel No results found for: CHOL, TRIG, HDL, CHOLHDL, VLDL, LDLCALC, LDLDIRECT   Wt Readings from Last 3 Encounters:  08/07/17 221 lb 6.4 oz (100.4 kg)  05/27/17 225 lb 6.4 oz (102.2 kg)  05/06/17 222 lb (100.7 kg)     Other studies Reviewed: Additional studies/ records that were reviewed today include: Office notes, hospital records and testing.  ASSESSMENT AND PLAN:  1.  Chronic diastolic CHF: - Her weight is back to baseline after taking an extra 10 mg of Lasix - I spent time educating she and her husband on the amounts of sodium in the foods that they normally eat.  It turns out to be quite a lot. 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  Chronic atrial fibrillation: She is not having any palpitations, no presyncope or syncope. - Continue current therapy.  3.  Chronic anticoagulation: -Her  Coumadin is  followed by our office and she is compliant with appointments -She has some bruising on her left side, this is not tender and no evidence of fracture but she does not know how she got it. - No falls since the one in March that caused multiple soft tissue injuries   Current medicines are reviewed at length with the patient today.  The patient does not have concerns regarding medicines.  The following changes have been made: Okay to take an extra Lasix tablet occasionally to manage her volume  Labs/ tests ordered today include:  No orders of the defined types were placed in this encounter.    Disposition:   FU with Dr. Martinique  Signed, Rosaria Ferries, PA-C  08/07/2017 1:35 PM    [facility] Phone: [phone]; Fax: [phone]  This note was written with the assistance of speech recognition software. Please excuse any transcriptional errors.

## 2017-08-07 NOTE — Patient Instructions (Signed)
Medication Instructions: Ok to take an extra dose of lasix as needed    If you need a refill on your cardiac medications before your next appointment, please call your pharmacy.      Follow-Up: Your physician wants you to follow-up in Keep scheduled appointment with Dr. Martinique.   Special Instructions: Limit Sodium to 500 mg per meal  Limit liquid intake to 1.5 ml daily  Continue to weigh yourself daily   Thank you for choosing [facility] at [facility]!!

## 2017-08-08 DIAGNOSIS — I4891 Unspecified atrial fibrillation: Secondary | ICD-10-CM | POA: Diagnosis not present

## 2017-08-08 DIAGNOSIS — M4800 Spinal stenosis, site unspecified: Secondary | ICD-10-CM | POA: Diagnosis not present

## 2017-08-08 DIAGNOSIS — M150 Primary generalized (osteo)arthritis: Secondary | ICD-10-CM | POA: Diagnosis not present

## 2017-08-08 DIAGNOSIS — M797 Fibromyalgia: Secondary | ICD-10-CM | POA: Diagnosis not present

## 2017-08-08 DIAGNOSIS — I509 Heart failure, unspecified: Secondary | ICD-10-CM | POA: Diagnosis not present

## 2017-08-08 DIAGNOSIS — I110 Hypertensive heart disease with heart failure: Secondary | ICD-10-CM | POA: Diagnosis not present

## 2017-08-11 DIAGNOSIS — I4891 Unspecified atrial fibrillation: Secondary | ICD-10-CM | POA: Diagnosis not present

## 2017-08-11 DIAGNOSIS — I110 Hypertensive heart disease with heart failure: Secondary | ICD-10-CM | POA: Diagnosis not present

## 2017-08-11 DIAGNOSIS — M150 Primary generalized (osteo)arthritis: Secondary | ICD-10-CM | POA: Diagnosis not present

## 2017-08-11 DIAGNOSIS — I509 Heart failure, unspecified: Secondary | ICD-10-CM | POA: Diagnosis not present

## 2017-08-11 DIAGNOSIS — M797 Fibromyalgia: Secondary | ICD-10-CM | POA: Diagnosis not present

## 2017-08-11 DIAGNOSIS — M4800 Spinal stenosis, site unspecified: Secondary | ICD-10-CM | POA: Diagnosis not present

## 2017-08-15 DIAGNOSIS — M25572 Pain in left ankle and joints of left foot: Secondary | ICD-10-CM | POA: Diagnosis not present

## 2017-08-18 DIAGNOSIS — R197 Diarrhea, unspecified: Secondary | ICD-10-CM | POA: Diagnosis not present

## 2017-08-19 ENCOUNTER — Ambulatory Visit: Payer: Medicare Other | Admitting: Podiatry

## 2017-08-20 DIAGNOSIS — I4891 Unspecified atrial fibrillation: Secondary | ICD-10-CM | POA: Diagnosis not present

## 2017-08-20 DIAGNOSIS — M4800 Spinal stenosis, site unspecified: Secondary | ICD-10-CM | POA: Diagnosis not present

## 2017-08-20 DIAGNOSIS — I110 Hypertensive heart disease with heart failure: Secondary | ICD-10-CM | POA: Diagnosis not present

## 2017-08-20 DIAGNOSIS — I509 Heart failure, unspecified: Secondary | ICD-10-CM | POA: Diagnosis not present

## 2017-08-20 DIAGNOSIS — M150 Primary generalized (osteo)arthritis: Secondary | ICD-10-CM | POA: Diagnosis not present

## 2017-08-20 DIAGNOSIS — M797 Fibromyalgia: Secondary | ICD-10-CM | POA: Diagnosis not present

## 2017-08-21 DIAGNOSIS — M4800 Spinal stenosis, site unspecified: Secondary | ICD-10-CM | POA: Diagnosis not present

## 2017-08-21 DIAGNOSIS — I509 Heart failure, unspecified: Secondary | ICD-10-CM | POA: Diagnosis not present

## 2017-08-21 DIAGNOSIS — I4891 Unspecified atrial fibrillation: Secondary | ICD-10-CM | POA: Diagnosis not present

## 2017-08-21 DIAGNOSIS — M150 Primary generalized (osteo)arthritis: Secondary | ICD-10-CM | POA: Diagnosis not present

## 2017-08-21 DIAGNOSIS — I110 Hypertensive heart disease with heart failure: Secondary | ICD-10-CM | POA: Diagnosis not present

## 2017-08-21 DIAGNOSIS — M797 Fibromyalgia: Secondary | ICD-10-CM | POA: Diagnosis not present

## 2017-08-22 ENCOUNTER — Ambulatory Visit: Payer: Medicare Other | Admitting: Podiatry

## 2017-08-25 DIAGNOSIS — I4891 Unspecified atrial fibrillation: Secondary | ICD-10-CM | POA: Diagnosis not present

## 2017-08-25 DIAGNOSIS — M150 Primary generalized (osteo)arthritis: Secondary | ICD-10-CM | POA: Diagnosis not present

## 2017-08-25 DIAGNOSIS — M797 Fibromyalgia: Secondary | ICD-10-CM | POA: Diagnosis not present

## 2017-08-25 DIAGNOSIS — M4800 Spinal stenosis, site unspecified: Secondary | ICD-10-CM | POA: Diagnosis not present

## 2017-08-25 DIAGNOSIS — I110 Hypertensive heart disease with heart failure: Secondary | ICD-10-CM | POA: Diagnosis not present

## 2017-08-25 DIAGNOSIS — I509 Heart failure, unspecified: Secondary | ICD-10-CM | POA: Diagnosis not present

## 2017-08-26 ENCOUNTER — Encounter: Payer: Self-pay | Admitting: Podiatry

## 2017-08-26 ENCOUNTER — Ambulatory Visit (INDEPENDENT_AMBULATORY_CARE_PROVIDER_SITE_OTHER): Payer: Medicare Other | Admitting: Podiatry

## 2017-08-26 ENCOUNTER — Ambulatory Visit: Payer: Medicare Other | Admitting: Orthotics

## 2017-08-26 DIAGNOSIS — L97529 Non-pressure chronic ulcer of other part of left foot with unspecified severity: Secondary | ICD-10-CM | POA: Diagnosis not present

## 2017-08-26 DIAGNOSIS — I6523 Occlusion and stenosis of bilateral carotid arteries: Secondary | ICD-10-CM | POA: Diagnosis not present

## 2017-08-26 NOTE — Progress Notes (Signed)
Adjusted her OTS insert to address rolling out on left foot...added valgus wedge.

## 2017-08-27 ENCOUNTER — Ambulatory Visit (INDEPENDENT_AMBULATORY_CARE_PROVIDER_SITE_OTHER): Payer: Medicare Other | Admitting: Pharmacist

## 2017-08-27 DIAGNOSIS — I4891 Unspecified atrial fibrillation: Secondary | ICD-10-CM

## 2017-08-27 DIAGNOSIS — M150 Primary generalized (osteo)arthritis: Secondary | ICD-10-CM | POA: Diagnosis not present

## 2017-08-27 DIAGNOSIS — I509 Heart failure, unspecified: Secondary | ICD-10-CM | POA: Diagnosis not present

## 2017-08-27 DIAGNOSIS — M4800 Spinal stenosis, site unspecified: Secondary | ICD-10-CM | POA: Diagnosis not present

## 2017-08-27 DIAGNOSIS — I110 Hypertensive heart disease with heart failure: Secondary | ICD-10-CM | POA: Diagnosis not present

## 2017-08-27 DIAGNOSIS — M797 Fibromyalgia: Secondary | ICD-10-CM | POA: Diagnosis not present

## 2017-08-27 LAB — POCT INR: INR: 2.8 (ref 2.0–3.0)

## 2017-08-27 NOTE — Patient Instructions (Signed)
Description   Continue with 1 tablet daily except 1.5 tablet each Monday, Wednesday and Friday, repeat INR in 4 weeks

## 2017-08-28 NOTE — Progress Notes (Signed)
Subjective: 83 year old female presents the office today for follow-up evaluation of ulceration left foot. She has not been keeping a bandage eon the area as she felt tht it is healed. She denies any drainage, pus, redness, red streaks. She has no new concerns. She denies any fevers, chills, nausea, vomiting.  Denies any calf pain, chest pain, shortness of breath.  She has no other concerns today.  Objective: AAO x3, NAD DP/PT pulses palpable bilaterally, CRT less than 3 seconds On the left foot submetatarsal 1 there is a hyperkeratotic lesion.  Upon debridement there is no underlying ulceration, drainage or any signs of infection there is no surrounding erythema, ascending cellulitis.  There is no fluctuation or crepitation or any malodor.  No other open lesions or pre-ulcerative lesions. No pain with calf compression, swelling, warmth, erythema.  Assessment: Ulceration submetatarsal 1, which appears to be healed today without any signs of infection  Plan: -All treatment options discussed with the patient including all alternatives, risks, complications.  -Hyperkeratotic lesion was sharply debrided without any complications or bleeding to reveal the underlying wound is healed.  Stretching to the area daily.  Continue offloading at all times.  Rick did modify the orthotics for her today.  Trula Slade DPM

## 2017-09-01 DIAGNOSIS — I110 Hypertensive heart disease with heart failure: Secondary | ICD-10-CM | POA: Diagnosis not present

## 2017-09-01 DIAGNOSIS — M150 Primary generalized (osteo)arthritis: Secondary | ICD-10-CM | POA: Diagnosis not present

## 2017-09-01 DIAGNOSIS — I4891 Unspecified atrial fibrillation: Secondary | ICD-10-CM | POA: Diagnosis not present

## 2017-09-01 DIAGNOSIS — I509 Heart failure, unspecified: Secondary | ICD-10-CM | POA: Diagnosis not present

## 2017-09-01 DIAGNOSIS — M4800 Spinal stenosis, site unspecified: Secondary | ICD-10-CM | POA: Diagnosis not present

## 2017-09-01 DIAGNOSIS — M797 Fibromyalgia: Secondary | ICD-10-CM | POA: Diagnosis not present

## 2017-09-04 DIAGNOSIS — I110 Hypertensive heart disease with heart failure: Secondary | ICD-10-CM | POA: Diagnosis not present

## 2017-09-04 DIAGNOSIS — M150 Primary generalized (osteo)arthritis: Secondary | ICD-10-CM | POA: Diagnosis not present

## 2017-09-04 DIAGNOSIS — I4891 Unspecified atrial fibrillation: Secondary | ICD-10-CM | POA: Diagnosis not present

## 2017-09-04 DIAGNOSIS — I509 Heart failure, unspecified: Secondary | ICD-10-CM | POA: Diagnosis not present

## 2017-09-04 DIAGNOSIS — M797 Fibromyalgia: Secondary | ICD-10-CM | POA: Diagnosis not present

## 2017-09-04 DIAGNOSIS — M4800 Spinal stenosis, site unspecified: Secondary | ICD-10-CM | POA: Diagnosis not present

## 2017-09-06 DIAGNOSIS — M4800 Spinal stenosis, site unspecified: Secondary | ICD-10-CM | POA: Diagnosis not present

## 2017-09-06 DIAGNOSIS — M150 Primary generalized (osteo)arthritis: Secondary | ICD-10-CM | POA: Diagnosis not present

## 2017-09-06 DIAGNOSIS — I4891 Unspecified atrial fibrillation: Secondary | ICD-10-CM | POA: Diagnosis not present

## 2017-09-06 DIAGNOSIS — M797 Fibromyalgia: Secondary | ICD-10-CM | POA: Diagnosis not present

## 2017-09-06 DIAGNOSIS — I110 Hypertensive heart disease with heart failure: Secondary | ICD-10-CM | POA: Diagnosis not present

## 2017-09-06 DIAGNOSIS — I509 Heart failure, unspecified: Secondary | ICD-10-CM | POA: Diagnosis not present

## 2017-09-09 DIAGNOSIS — I10 Essential (primary) hypertension: Secondary | ICD-10-CM | POA: Diagnosis not present

## 2017-09-09 DIAGNOSIS — E785 Hyperlipidemia, unspecified: Secondary | ICD-10-CM | POA: Diagnosis not present

## 2017-09-11 DIAGNOSIS — M4800 Spinal stenosis, site unspecified: Secondary | ICD-10-CM | POA: Diagnosis not present

## 2017-09-11 DIAGNOSIS — M150 Primary generalized (osteo)arthritis: Secondary | ICD-10-CM | POA: Diagnosis not present

## 2017-09-11 DIAGNOSIS — I509 Heart failure, unspecified: Secondary | ICD-10-CM | POA: Diagnosis not present

## 2017-09-11 DIAGNOSIS — I110 Hypertensive heart disease with heart failure: Secondary | ICD-10-CM | POA: Diagnosis not present

## 2017-09-11 DIAGNOSIS — M797 Fibromyalgia: Secondary | ICD-10-CM | POA: Diagnosis not present

## 2017-09-11 DIAGNOSIS — I4891 Unspecified atrial fibrillation: Secondary | ICD-10-CM | POA: Diagnosis not present

## 2017-09-16 DIAGNOSIS — K862 Cyst of pancreas: Secondary | ICD-10-CM | POA: Diagnosis not present

## 2017-09-16 DIAGNOSIS — K219 Gastro-esophageal reflux disease without esophagitis: Secondary | ICD-10-CM | POA: Diagnosis not present

## 2017-09-16 DIAGNOSIS — R198 Other specified symptoms and signs involving the digestive system and abdomen: Secondary | ICD-10-CM | POA: Diagnosis not present

## 2017-09-16 DIAGNOSIS — K5000 Crohn's disease of small intestine without complications: Secondary | ICD-10-CM | POA: Diagnosis not present

## 2017-09-17 DIAGNOSIS — I110 Hypertensive heart disease with heart failure: Secondary | ICD-10-CM | POA: Diagnosis not present

## 2017-09-17 DIAGNOSIS — M150 Primary generalized (osteo)arthritis: Secondary | ICD-10-CM | POA: Diagnosis not present

## 2017-09-17 DIAGNOSIS — I509 Heart failure, unspecified: Secondary | ICD-10-CM | POA: Diagnosis not present

## 2017-09-17 DIAGNOSIS — I4891 Unspecified atrial fibrillation: Secondary | ICD-10-CM | POA: Diagnosis not present

## 2017-09-18 DIAGNOSIS — M4800 Spinal stenosis, site unspecified: Secondary | ICD-10-CM | POA: Diagnosis not present

## 2017-09-18 DIAGNOSIS — I4891 Unspecified atrial fibrillation: Secondary | ICD-10-CM | POA: Diagnosis not present

## 2017-09-18 DIAGNOSIS — M797 Fibromyalgia: Secondary | ICD-10-CM | POA: Diagnosis not present

## 2017-09-18 DIAGNOSIS — I110 Hypertensive heart disease with heart failure: Secondary | ICD-10-CM | POA: Diagnosis not present

## 2017-09-18 DIAGNOSIS — M150 Primary generalized (osteo)arthritis: Secondary | ICD-10-CM | POA: Diagnosis not present

## 2017-09-18 DIAGNOSIS — I509 Heart failure, unspecified: Secondary | ICD-10-CM | POA: Diagnosis not present

## 2017-09-19 DIAGNOSIS — M4800 Spinal stenosis, site unspecified: Secondary | ICD-10-CM | POA: Diagnosis not present

## 2017-09-19 DIAGNOSIS — I110 Hypertensive heart disease with heart failure: Secondary | ICD-10-CM | POA: Diagnosis not present

## 2017-09-19 DIAGNOSIS — M797 Fibromyalgia: Secondary | ICD-10-CM | POA: Diagnosis not present

## 2017-09-19 DIAGNOSIS — I509 Heart failure, unspecified: Secondary | ICD-10-CM | POA: Diagnosis not present

## 2017-09-19 DIAGNOSIS — M150 Primary generalized (osteo)arthritis: Secondary | ICD-10-CM | POA: Diagnosis not present

## 2017-09-19 DIAGNOSIS — I4891 Unspecified atrial fibrillation: Secondary | ICD-10-CM | POA: Diagnosis not present

## 2017-09-23 ENCOUNTER — Ambulatory Visit: Payer: PRIVATE HEALTH INSURANCE | Admitting: Podiatry

## 2017-09-23 DIAGNOSIS — M797 Fibromyalgia: Secondary | ICD-10-CM | POA: Diagnosis not present

## 2017-09-23 DIAGNOSIS — M4800 Spinal stenosis, site unspecified: Secondary | ICD-10-CM | POA: Diagnosis not present

## 2017-09-23 DIAGNOSIS — I110 Hypertensive heart disease with heart failure: Secondary | ICD-10-CM | POA: Diagnosis not present

## 2017-09-23 DIAGNOSIS — I4891 Unspecified atrial fibrillation: Secondary | ICD-10-CM | POA: Diagnosis not present

## 2017-09-23 DIAGNOSIS — M150 Primary generalized (osteo)arthritis: Secondary | ICD-10-CM | POA: Diagnosis not present

## 2017-09-23 DIAGNOSIS — I509 Heart failure, unspecified: Secondary | ICD-10-CM | POA: Diagnosis not present

## 2017-09-26 ENCOUNTER — Ambulatory Visit (INDEPENDENT_AMBULATORY_CARE_PROVIDER_SITE_OTHER): Payer: Medicare Other | Admitting: Pharmacist

## 2017-09-26 DIAGNOSIS — M47817 Spondylosis without myelopathy or radiculopathy, lumbosacral region: Secondary | ICD-10-CM | POA: Diagnosis not present

## 2017-09-26 DIAGNOSIS — M545 Low back pain: Secondary | ICD-10-CM | POA: Diagnosis not present

## 2017-09-26 DIAGNOSIS — I4891 Unspecified atrial fibrillation: Secondary | ICD-10-CM | POA: Diagnosis not present

## 2017-09-26 LAB — POCT INR: INR: 2.9 (ref 2.0–3.0)

## 2017-09-26 NOTE — Patient Instructions (Signed)
Description   Take 0.5 tablet today, take 1 tablet on Monday, and continue with 1 tablet daily except 1.5 tablet each Wednesday and Friday, come in on 11/01/17 for INR check after starting your prednisone.

## 2017-09-29 DIAGNOSIS — M961 Postlaminectomy syndrome, not elsewhere classified: Secondary | ICD-10-CM | POA: Diagnosis not present

## 2017-09-29 DIAGNOSIS — M5136 Other intervertebral disc degeneration, lumbar region: Secondary | ICD-10-CM | POA: Diagnosis not present

## 2017-09-29 DIAGNOSIS — G894 Chronic pain syndrome: Secondary | ICD-10-CM | POA: Diagnosis not present

## 2017-09-29 DIAGNOSIS — M545 Low back pain: Secondary | ICD-10-CM | POA: Diagnosis not present

## 2017-09-30 DIAGNOSIS — M150 Primary generalized (osteo)arthritis: Secondary | ICD-10-CM | POA: Diagnosis not present

## 2017-09-30 DIAGNOSIS — M4800 Spinal stenosis, site unspecified: Secondary | ICD-10-CM | POA: Diagnosis not present

## 2017-09-30 DIAGNOSIS — Z961 Presence of intraocular lens: Secondary | ICD-10-CM | POA: Diagnosis not present

## 2017-09-30 DIAGNOSIS — I4891 Unspecified atrial fibrillation: Secondary | ICD-10-CM | POA: Diagnosis not present

## 2017-09-30 DIAGNOSIS — M797 Fibromyalgia: Secondary | ICD-10-CM | POA: Diagnosis not present

## 2017-09-30 DIAGNOSIS — H401113 Primary open-angle glaucoma, right eye, severe stage: Secondary | ICD-10-CM | POA: Diagnosis not present

## 2017-09-30 DIAGNOSIS — H40052 Ocular hypertension, left eye: Secondary | ICD-10-CM | POA: Diagnosis not present

## 2017-09-30 DIAGNOSIS — I110 Hypertensive heart disease with heart failure: Secondary | ICD-10-CM | POA: Diagnosis not present

## 2017-09-30 DIAGNOSIS — I509 Heart failure, unspecified: Secondary | ICD-10-CM | POA: Diagnosis not present

## 2017-10-01 ENCOUNTER — Ambulatory Visit (INDEPENDENT_AMBULATORY_CARE_PROVIDER_SITE_OTHER): Payer: Medicare Other | Admitting: Pharmacist Clinician (PhC)/ Clinical Pharmacy Specialist

## 2017-10-01 DIAGNOSIS — I4891 Unspecified atrial fibrillation: Secondary | ICD-10-CM | POA: Diagnosis not present

## 2017-10-01 DIAGNOSIS — Z7901 Long term (current) use of anticoagulants: Secondary | ICD-10-CM | POA: Diagnosis not present

## 2017-10-01 LAB — POCT INR: INR: 1.9 — AB (ref 2.0–3.0)

## 2017-10-02 DIAGNOSIS — I4891 Unspecified atrial fibrillation: Secondary | ICD-10-CM | POA: Diagnosis not present

## 2017-10-02 DIAGNOSIS — M4800 Spinal stenosis, site unspecified: Secondary | ICD-10-CM | POA: Diagnosis not present

## 2017-10-02 DIAGNOSIS — I110 Hypertensive heart disease with heart failure: Secondary | ICD-10-CM | POA: Diagnosis not present

## 2017-10-02 DIAGNOSIS — M797 Fibromyalgia: Secondary | ICD-10-CM | POA: Diagnosis not present

## 2017-10-02 DIAGNOSIS — I509 Heart failure, unspecified: Secondary | ICD-10-CM | POA: Diagnosis not present

## 2017-10-02 DIAGNOSIS — M150 Primary generalized (osteo)arthritis: Secondary | ICD-10-CM | POA: Diagnosis not present

## 2017-10-03 ENCOUNTER — Encounter: Payer: Self-pay | Admitting: Podiatry

## 2017-10-03 ENCOUNTER — Ambulatory Visit (INDEPENDENT_AMBULATORY_CARE_PROVIDER_SITE_OTHER): Payer: Medicare Other | Admitting: Podiatry

## 2017-10-03 VITALS — Temp 97.3°F

## 2017-10-03 DIAGNOSIS — L97529 Non-pressure chronic ulcer of other part of left foot with unspecified severity: Secondary | ICD-10-CM

## 2017-10-06 NOTE — Progress Notes (Signed)
Subjective: 83 year old female presents the office today for follow-up evaluation of a wound on the left foot.  She states she is doing better but she did recently noticed some bleeding.  Denies any pus.  Denies any swelling or redness.  She is been wearing a regular shoe with offloading pads.  She has no other concerns today. Denies any systemic complaints such as fevers, chills, nausea, vomiting. No acute changes since last appointment, and no other complaints at this time.   Objective: AAO x3, NAD DP/PT pulses palpable bilaterally, CRT less than 3 seconds On the left foot submetatarsal 1 appears to be hyperkeratotic lesion with a old blister on the area.  Upon debridement there was a recurrent ulceration present today although it was small measuring 0.2 x 0.1 cm and is superficial without any probing, undermining or tunneling.  There is no surrounding erythema, ascending cellulitis.  There is no fluctuation or crepitation or any malodor. No open lesions or other pre-ulcerative lesions.  No pain with calf compression, swelling, warmth, erythema  Assessment: Ulceration left foot without signs of infection  Plan: -All treatment options discussed with the patient including all alternatives, risks, complications.  -I sharply debrided the wound to healthy, granular base utilizing #312 blade scalpel.  On her continue with daily dressing changes.  Continue offloading at all times.  Monitoring signs or symptoms of infection.  She is declined surgical shoe. -Patient encouraged to call the office with any questions, concerns, change in symptoms.   Return in about 10 days (around 10/13/2017).  Trula Slade DPM

## 2017-10-08 DIAGNOSIS — M797 Fibromyalgia: Secondary | ICD-10-CM | POA: Diagnosis not present

## 2017-10-08 DIAGNOSIS — I509 Heart failure, unspecified: Secondary | ICD-10-CM | POA: Diagnosis not present

## 2017-10-08 DIAGNOSIS — M150 Primary generalized (osteo)arthritis: Secondary | ICD-10-CM | POA: Diagnosis not present

## 2017-10-08 DIAGNOSIS — I110 Hypertensive heart disease with heart failure: Secondary | ICD-10-CM | POA: Diagnosis not present

## 2017-10-08 DIAGNOSIS — I4891 Unspecified atrial fibrillation: Secondary | ICD-10-CM | POA: Diagnosis not present

## 2017-10-08 DIAGNOSIS — M4800 Spinal stenosis, site unspecified: Secondary | ICD-10-CM | POA: Diagnosis not present

## 2017-10-14 ENCOUNTER — Ambulatory Visit: Payer: Medicare Other | Admitting: Podiatry

## 2017-10-14 DIAGNOSIS — M150 Primary generalized (osteo)arthritis: Secondary | ICD-10-CM | POA: Diagnosis not present

## 2017-10-14 DIAGNOSIS — M4800 Spinal stenosis, site unspecified: Secondary | ICD-10-CM | POA: Diagnosis not present

## 2017-10-14 DIAGNOSIS — M797 Fibromyalgia: Secondary | ICD-10-CM | POA: Diagnosis not present

## 2017-10-14 DIAGNOSIS — I4891 Unspecified atrial fibrillation: Secondary | ICD-10-CM | POA: Diagnosis not present

## 2017-10-14 DIAGNOSIS — I509 Heart failure, unspecified: Secondary | ICD-10-CM | POA: Diagnosis not present

## 2017-10-14 DIAGNOSIS — I110 Hypertensive heart disease with heart failure: Secondary | ICD-10-CM | POA: Diagnosis not present

## 2017-10-15 DIAGNOSIS — M150 Primary generalized (osteo)arthritis: Secondary | ICD-10-CM | POA: Diagnosis not present

## 2017-10-15 DIAGNOSIS — M797 Fibromyalgia: Secondary | ICD-10-CM | POA: Diagnosis not present

## 2017-10-15 DIAGNOSIS — M4800 Spinal stenosis, site unspecified: Secondary | ICD-10-CM | POA: Diagnosis not present

## 2017-10-15 DIAGNOSIS — I110 Hypertensive heart disease with heart failure: Secondary | ICD-10-CM | POA: Diagnosis not present

## 2017-10-15 DIAGNOSIS — I4891 Unspecified atrial fibrillation: Secondary | ICD-10-CM | POA: Diagnosis not present

## 2017-10-15 DIAGNOSIS — I509 Heart failure, unspecified: Secondary | ICD-10-CM | POA: Diagnosis not present

## 2017-10-16 DIAGNOSIS — M961 Postlaminectomy syndrome, not elsewhere classified: Secondary | ICD-10-CM | POA: Diagnosis not present

## 2017-10-16 DIAGNOSIS — M5136 Other intervertebral disc degeneration, lumbar region: Secondary | ICD-10-CM | POA: Diagnosis not present

## 2017-10-20 DIAGNOSIS — M797 Fibromyalgia: Secondary | ICD-10-CM | POA: Diagnosis not present

## 2017-10-20 DIAGNOSIS — M4800 Spinal stenosis, site unspecified: Secondary | ICD-10-CM | POA: Diagnosis not present

## 2017-10-20 DIAGNOSIS — I110 Hypertensive heart disease with heart failure: Secondary | ICD-10-CM | POA: Diagnosis not present

## 2017-10-20 DIAGNOSIS — I509 Heart failure, unspecified: Secondary | ICD-10-CM | POA: Diagnosis not present

## 2017-10-20 DIAGNOSIS — I4891 Unspecified atrial fibrillation: Secondary | ICD-10-CM | POA: Diagnosis not present

## 2017-10-20 DIAGNOSIS — M150 Primary generalized (osteo)arthritis: Secondary | ICD-10-CM | POA: Diagnosis not present

## 2017-10-21 ENCOUNTER — Ambulatory Visit (INDEPENDENT_AMBULATORY_CARE_PROVIDER_SITE_OTHER): Payer: Medicare Other | Admitting: Podiatry

## 2017-10-21 DIAGNOSIS — I6523 Occlusion and stenosis of bilateral carotid arteries: Secondary | ICD-10-CM | POA: Diagnosis not present

## 2017-10-21 DIAGNOSIS — L97529 Non-pressure chronic ulcer of other part of left foot with unspecified severity: Secondary | ICD-10-CM

## 2017-10-22 ENCOUNTER — Ambulatory Visit (INDEPENDENT_AMBULATORY_CARE_PROVIDER_SITE_OTHER): Payer: Medicare Other | Admitting: Pharmacist Clinician (PhC)/ Clinical Pharmacy Specialist

## 2017-10-22 DIAGNOSIS — Z7901 Long term (current) use of anticoagulants: Secondary | ICD-10-CM

## 2017-10-22 DIAGNOSIS — M4800 Spinal stenosis, site unspecified: Secondary | ICD-10-CM | POA: Diagnosis not present

## 2017-10-22 DIAGNOSIS — I4891 Unspecified atrial fibrillation: Secondary | ICD-10-CM | POA: Diagnosis not present

## 2017-10-22 DIAGNOSIS — M797 Fibromyalgia: Secondary | ICD-10-CM | POA: Diagnosis not present

## 2017-10-22 DIAGNOSIS — I509 Heart failure, unspecified: Secondary | ICD-10-CM | POA: Diagnosis not present

## 2017-10-22 DIAGNOSIS — I110 Hypertensive heart disease with heart failure: Secondary | ICD-10-CM | POA: Diagnosis not present

## 2017-10-22 DIAGNOSIS — M150 Primary generalized (osteo)arthritis: Secondary | ICD-10-CM | POA: Diagnosis not present

## 2017-10-22 LAB — POCT INR: INR: 2.6 (ref 2.0–3.0)

## 2017-10-22 NOTE — Progress Notes (Signed)
Subjective: 83 year old female presents the office today with her husband for follow-up evaluation of a wound on the left foot.  She has been applying the Medihoney to the wound daily.  She states the area is getting much better she denies any swelling, drainage, redness or any pain.  She feels that the area is healed.  She has no other concerns. Denies any systemic complaints such as fevers, chills, nausea, vomiting. No acute changes since last appointment, and no other complaints at this time.   Objective: AAO x3, NAD DP/PT pulses palpable bilaterally, CRT less than 3 seconds In the left foot submetatarsal 1 is a hyperkeratotic lesion.  Upon debridement appears that the wound is healed.  There is no edema, erythema, drainage or pus there is no clinical signs of infection present.  There was some dried blood under the callus.  No open lesions or pre-ulcerative lesions.  No pain with calf compression, swelling, warmth, erythema  Assessment: Healed wound left foot  Plan: -All treatment options discussed with the patient including all alternatives, risks, complications.  -Hyperkeratotic lesion was sharply debrided x1 without any complications or bleeding to reveal the underlying wound is healed.  I want her to continue with daily dressing changes, offloading at all times.  Monitoring signs or symptoms of recurrence or infection. -Patient encouraged to call the office with any questions, concerns, change in symptoms.   Return in about 2 weeks (around 11/04/2017).  Trula Slade DPM

## 2017-10-27 DIAGNOSIS — M150 Primary generalized (osteo)arthritis: Secondary | ICD-10-CM | POA: Diagnosis not present

## 2017-10-27 DIAGNOSIS — M4800 Spinal stenosis, site unspecified: Secondary | ICD-10-CM | POA: Diagnosis not present

## 2017-10-27 DIAGNOSIS — I110 Hypertensive heart disease with heart failure: Secondary | ICD-10-CM | POA: Diagnosis not present

## 2017-10-27 DIAGNOSIS — I4891 Unspecified atrial fibrillation: Secondary | ICD-10-CM | POA: Diagnosis not present

## 2017-10-27 DIAGNOSIS — I509 Heart failure, unspecified: Secondary | ICD-10-CM | POA: Diagnosis not present

## 2017-10-27 DIAGNOSIS — M797 Fibromyalgia: Secondary | ICD-10-CM | POA: Diagnosis not present

## 2017-10-30 DIAGNOSIS — I4891 Unspecified atrial fibrillation: Secondary | ICD-10-CM | POA: Diagnosis not present

## 2017-10-30 DIAGNOSIS — I509 Heart failure, unspecified: Secondary | ICD-10-CM | POA: Diagnosis not present

## 2017-10-30 DIAGNOSIS — M4800 Spinal stenosis, site unspecified: Secondary | ICD-10-CM | POA: Diagnosis not present

## 2017-10-30 DIAGNOSIS — I110 Hypertensive heart disease with heart failure: Secondary | ICD-10-CM | POA: Diagnosis not present

## 2017-10-30 DIAGNOSIS — M150 Primary generalized (osteo)arthritis: Secondary | ICD-10-CM | POA: Diagnosis not present

## 2017-10-30 DIAGNOSIS — M797 Fibromyalgia: Secondary | ICD-10-CM | POA: Diagnosis not present

## 2017-11-04 ENCOUNTER — Encounter: Payer: Self-pay | Admitting: Podiatry

## 2017-11-04 ENCOUNTER — Ambulatory Visit (INDEPENDENT_AMBULATORY_CARE_PROVIDER_SITE_OTHER): Payer: Medicare Other | Admitting: Podiatry

## 2017-11-04 DIAGNOSIS — L97529 Non-pressure chronic ulcer of other part of left foot with unspecified severity: Secondary | ICD-10-CM | POA: Diagnosis not present

## 2017-11-04 DIAGNOSIS — I6523 Occlusion and stenosis of bilateral carotid arteries: Secondary | ICD-10-CM

## 2017-11-05 NOTE — Progress Notes (Signed)
Subjective: 83 year old female presents the office today with her husband for follow-up evaluation of a wound on the left foot.  She states the area is been doing well.  She denies any drainage or pus coming from the area she feels the wound is healed.  No swelling to her foot she has no other concerns today. Denies any systemic complaints such as fevers, chills, nausea, vomiting. No acute changes since last appointment, and no other complaints at this time.   Objective: AAO x3, NAD DP/PT pulses palpable bilaterally, CRT less than 3 seconds In the left foot submetatarsal 1 is a hyperkeratotic lesion.  Upon debridement appears that the wound is healed.  There is no edema, erythema, drainage or pus there is no clinical signs of infection present.  There was some dried blood under the callus.  No open lesions or pre-ulcerative lesions.  No pain with calf compression, swelling, warmth, erythema  Assessment: Healed wound left foot   Plan: -All treatment options discussed with the patient including all alternatives, risks, complications.  -Hyperkeratotic lesion was sharply debrided x1 without any complications or bleeding to reveal the underlying wound is healed however it still pre-ulcerative.  I want her to continue offloading at all times I dispensed a gel offloading pad that she can try as well.  Trula Slade DPM

## 2017-11-11 ENCOUNTER — Other Ambulatory Visit: Payer: Self-pay

## 2017-11-11 ENCOUNTER — Encounter: Payer: Self-pay | Admitting: Family

## 2017-11-11 ENCOUNTER — Ambulatory Visit (INDEPENDENT_AMBULATORY_CARE_PROVIDER_SITE_OTHER): Payer: Medicare Other | Admitting: Family

## 2017-11-11 ENCOUNTER — Ambulatory Visit (HOSPITAL_COMMUNITY)
Admission: RE | Admit: 2017-11-11 | Discharge: 2017-11-11 | Disposition: A | Discharge status: Home / Self Care | Payer: Medicare Other | Source: Ambulatory Visit | Attending: Family | Admitting: Family

## 2017-11-11 VITALS — BP 170/92 | HR 96 | Temp 97.3°F | Resp 20 | Ht 62.0 in | Wt 219.0 lb

## 2017-11-11 DIAGNOSIS — I6522 Occlusion and stenosis of left carotid artery: Secondary | ICD-10-CM | POA: Diagnosis not present

## 2017-11-11 DIAGNOSIS — I6523 Occlusion and stenosis of bilateral carotid arteries: Secondary | ICD-10-CM | POA: Diagnosis not present

## 2017-11-11 DIAGNOSIS — I6521 Occlusion and stenosis of right carotid artery: Secondary | ICD-10-CM | POA: Diagnosis not present

## 2017-11-11 DIAGNOSIS — G894 Chronic pain syndrome: Secondary | ICD-10-CM

## 2017-11-11 NOTE — Progress Notes (Signed)
Chief Complaint: Follow up Extracranial Carotid Artery Stenosis   History of Present Illness  [name] is a 83 y.o. female whom Dr. Donnetta Hutching has monitored for left-sided carotid stenosis with a known right ICA occlusion.  Patient has not had previous carotid artery intervention.  She denies any remote or recent stroke of TIA symptoms.  She denies steal type symptoms in her upper extremities.  Does not have claudication type symptoms.  Tires easily since she had the PE.   Has mild neuropathy in both feet for years, attributes to back issues.  She had detatched retina repaired Dec. 2, 2014, had cataract surgery July, 2015, can see much better, no more headaches. She has Crohn's Disease and was evaluted for occult GI bleed, is anemic, states her Hg is increasing with oral iron. April, 2014 she had a left total shoulder replaced, then developed PE's, she now takes coumadin.  Her right shoulder has issues.  She has had lumbar spine surgery.  She has severe generalized OA, has affected her feet, has trouble walking. She was receiving physical therapy, is doing some walking. Is also getting some treatments to her feet (OA pain) that helps with pain and is able to walk more.   She also has fibromyalgia, states she is never without pain: in her left foot since an injury years ago (non operable), and generalized pain from OA and fibromyalgia. The pain is less when she gets warm in bed.   She sees Dr. Nelva Bush for pain management, takes hydrocodone   She states she has proven white coat syndrome.  She had an exacerbation of her CHF January 2016, observed in ED, did not need to be admitted.  She had her left foot injected 04/24/15 with a nerve block to help pain. She developed a severe URI early in 2017 with a relapse, had PT to help with weakness. She saw Dr. Missy Sabins in February 2017 for borderline CHF.   124/68 was her blood pressure at home this morning per pt.  She exercises  daily.   She fell after seeing me on 05-06-17, saw Dr. Ricki Rodriguez. She then had some home PT to help her improve her walking and balance.   Pt Diabetic: No Pt smoker: former smoker, quit in 1988, smoked x 15 years, 1/2 ppd  Pt meds include:  Statin : No: cannot tolerate  Betablocker: Yes  ASA: No  Other anticoagulants/antiplatelets: coumadinfor atrial fib and hx of PE post shoulder surgery   Past Medical History:  Diagnosis Date  . Atrial fibrillation ([place])   . Carotid artery occlusion   . Chronic diastolic CHF (congestive heart failure) (HCC)    Hypertensive heart disease 02-12-14  . Crohn's disease ([place])   . Detached retina   . Fibromyalgia   . GERD (gastroesophageal reflux disease)   . Gout   . H/O hiatal hernia   . High triglycerides   . History of stomach ulcers 1950's  . Hypertension   . Long term current use of anticoagulant   . Obstructive sleep apnea on CPAP   . Osteoarthritis    PAIN AND OA LEF T HIP AND BOTH SHOULDERS ARE BONE ON BONE AND PAINFUL  . Peripheral vascular disease (HCC)    KNOWN RIGHT INTERNAL CAROTID ARTERY OCCLUSION (NO STROKE)  --40 TO 59% STENOSIS LEFT ICA-FOLLOWED BY DR. EARLY WITH DOPPLER STUDY EVERY 6 MONTHS  . Pulmonary embolism ([place]) 2011   a. Hx of PE in 02/2009 after R hip surgery, venous dopplers negative,  long-term Coumadin.  [name] Kitchen PVC (premature ventricular contraction)    PT STATES HX OF PVC'S ON EKG  . Recurrent upper respiratory infection (URI)    BRONCHITIS FEB 2013--SLIGHT COUGH NON-PRODUCTIVE NOW  . Recurrent UTI (urinary tract infection)    "on daily medicine" (05/14/2012)  . Tinnitus   . Vertigo     Social History Social History   Tobacco Use  . Smoking status: Former Smoker    Packs/day: 0.50    Years: 15.00    Pack years: 7.50    Types: Cigarettes    Last attempt to quit: 02/11/1986    Years since quitting: 31.7  . Smokeless tobacco: Never Used  Substance Use Topics  . Alcohol use: Yes    Alcohol/week: 0.0 standard  drinks    Comment: 05/14/2012 "have 2-3 drinks/yr" maybe  . Drug use: No    Family History Family History  Problem Relation Age of Onset  . Heart disease Father        Heart Disease before age 40  . Heart attack Father 73  . Hypertension Father   . Peripheral vascular disease Father        Right  leg amputation  . Heart disease Mother        AAA  . Stroke Mother   . Aneurysm Mother   . Hypertension Mother   . AAA (abdominal aortic aneurysm) Mother   . Diabetes Paternal Grandfather   . Hypertension Brother   . Heart disease Brother        Heart Disease before age 40  . Hyperlipidemia Brother   . Heart attack Brother   . Hypertension Brother   . Diabetes Son     Surgical History Past Surgical History:  Procedure Laterality Date  . APPENDECTOMY  1950's  . BACK SURGERY  10/29/06   Central and foraminal decompression L3-L4, L4-L5, and L5-S1 with inspection of L4-L5 and L5-S1 disc on the right  . CARDIAC CATHETERIZATION  1970's   "maybe 2" (05/14/2012)  . CARDIAC CATHETERIZATION    . CATARACT EXTRACTION W/ INTRAOCULAR LENS IMPLANT Right 2009  . CHOLECYSTECTOMY  ~ 1988  . DECOMPRESSIVE LUMBAR LAMINECTOMY LEVEL 3  ~ 2002  . DILATION AND CURETTAGE OF UTERUS     "several; from miscarriages" (05/14/2012)  . EXCISION MORTON'S NEUROMA Right 05/20/00   "foot" (05/14/2012)  . EYE SURGERY Left 2014   Detached retina  . EYE SURGERY Left August 23, 2013   Cataract  . FEMUR FRACTURE SURGERY Right 02/21/09   Open reduction internal fixation of right periprosthetic  femur fracture utilizing Zimmer cables times fiv  . FRACTURE SURGERY  2011   Right Femur Fx  . HAMMER TOE SURGERY Left 10/25/08   "toe next to big to" (05/14/2012)  . JOINT REPLACEMENT  2009   Right Hip replacement  . JOINT REPLACEMENT  2012   Right knee replacement  . JOINT REPLACEMENT  06/17/11   Left Hip replacement  . KNEE ARTHROSCOPY Right 07/26/05  . REPLACEMENT TOTAL KNEE Right 02/19/2010  . SPINE SURGERY    . [place]?  . TOTAL HIP ARTHROPLASTY Right 12/08/02   Osteonics total hip replacement  . TOTAL HIP ARTHROPLASTY  06/17/2011   Procedure: TOTAL HIP ARTHROPLASTY;  Surgeon: Gearlean Alf, MD;  Location: [facility];  Service: Orthopedics;  Laterality: Left;  . TOTAL SHOULDER ARTHROPLASTY Left 05/14/2012  . TOTAL SHOULDER ARTHROPLASTY Left 05/14/2012   Procedure: TOTAL SHOULDER ARTHROPLASTY;  Surgeon: Marin Shutter, MD;  Location: [place];  Service: Orthopedics;  Laterality: Left;  [name] Kitchen VAGINAL HYSTERECTOMY  1971    Allergies  Allergen Reactions  . Codeine     NAUSEA  . Cymbalta [Duloxetine Hcl]     Nausea VOMITING AND ABDOMINAL PAIN, HEADACHE, JUST ABOUT EVERY SIDE EFFECT THE DRUG HAS  . Diltiazem Cd [Diltiazem Hcl Er Beads]     Heavy legs, cough  . Metoprolol     Legs like cement  . Nortriptyline     Nightmares  . Penicillins     RASH  & ITCHING   --PT STATES SHE CAN TAKE KEFLEX PO AND IV CEPHALOSPORINS  . Statins     Leg cramps  . Tetanus Toxoid Adsorbed Swelling  . Tetanus Toxoids     SWELLING, REDNESS  WHOLE ARM  . Ciprofloxacin Rash    RASH  . Lyrica [Pregabalin] Diarrhea, Nausea And Vomiting and Rash  . Penicillin G Rash    Current Outpatient Medications  Medication Sig Dispense Refill  . allopurinol (ZYLOPRIM) 300 MG tablet Take 300 mg by mouth daily.    [name] Kitchen amLODipine (NORVASC) 2.5 MG tablet Take 2.5 mg by mouth daily.    . cephALEXin (KEFLEX) 500 MG capsule Take 1 capsule (500 mg total) by mouth 3 (three) times daily. 30 capsule 0  . cetirizine (ZYRTEC) 10 MG tablet Take 10 mg by mouth daily.    . clobetasol cream (TEMOVATE) 3.41 % Apply 1 application topically 2 (two) times daily.    [name] Kitchen CRANBERRY CONCENTRATE PO Take 1 tablet by mouth daily.    . dorzolamide-timolol (COSOPT) 22.3-6.8 MG/ML ophthalmic solution Place 1 drop into both eyes 2 (two) times daily.     . furosemide (LASIX) 20 MG tablet TAKE (1/2) TABLET DAILY. 45 tablet 0  . HYDROcodone-acetaminophen  (NORCO) 10-325 MG per tablet Take 1 tablet by mouth every 6 (six) hours as needed (pain).     . indapamide (LOZOL) 2.5 MG tablet Take 2.5 mg by mouth daily.    [name] Kitchen ketoconazole (NIZORAL) 2 % cream Apply 1 application topically 2 (two) times daily as needed for irritation.     [name] Kitchen LUMIGAN 0.01 % SOLN Place 1 drop into both eyes at bedtime.    . mupirocin ointment (BACTROBAN) 2 % Apply 1 application topically 2 (two) times daily. 30 g 2  . nebivolol (BYSTOLIC) 10 MG tablet Take 1 tablet (10 mg total) by mouth at bedtime. 90 tablet 3  . NONFORMULARY OR COMPOUNDED ITEM [facility]  Peripheral Neuropathy Cream- Bupivacaine 1%, Doxepin 3%, Gabapentin 6%, Pentoxifylline 3%, Topiramate 1% Apply 1-2 grams to affected area 3-4 times daily Qty. 120 gm 3 refills 1 each 0  . omeprazole (PRILOSEC) 40 MG capsule Take 40 mg by mouth daily.    . ondansetron (ZOFRAN ODT) 4 MG disintegrating tablet Take 1 tablet (4 mg total) by mouth every 8 (eight) hours as needed for nausea or vomiting. 20 tablet 0  . potassium chloride (K-DUR,KLOR-CON) 10 MEQ tablet Take 10 mEq by mouth 2 (two) times daily.    . silver sulfADIAZINE (SILVADENE) 1 % cream Apply 1 application topically daily. 50 g 0  . traZODone (DESYREL) 50 MG tablet Take 50 mg by mouth at bedtime.    . triamcinolone (NASACORT ALLERGY 24HR) 55 MCG/ACT AERO nasal inhaler Place 2 sprays into the nose daily as needed.    . warfarin (COUMADIN) 2.5 MG tablet TAKE 1 TO 1&1/2 TABLETS DAILY AS DIRECTED BY COUMADIN CLINIC. 120 tablet 1   No current  facility-administered medications for this visit.     Review of Systems : See HPI for pertinent positives and negatives.  Physical Examination  Vitals:   11/11/17 1405 11/11/17 1407  BP: (!) 160/92 (!) 170/92  Pulse: 96   Resp: 20   Temp: (!) 97.3 F (36.3 C)   TempSrc: Oral   SpO2: 94%   Weight: 219 lb (99.3 kg)   Height: 5' 2"  (1.575 m)    Body mass index is 40.06 kg/m.  General: WDWN morbidly obese  female in NAD  GAIT:slow, deliberate, using cane HENT: no gross abnormalities  Eyes: pupils are pinpoint bilaterally (takes hydrocodone for chronic pain) Pulmonary: CTAB, respirations are non labored Cardiac: Irregularrhythm with controled rate, no detected murmur  VASCULAR EXAM Carotid Bruits Left Right   negative Negative    Abdominal aortic pulse is not palpable  Radial pulses are 2+ palpable and equal.   LE Pulses  LEFT  RIGHT   POPLITEAL  not palpable  not palpable  DP palpable palpable   Bilateral DP pulses are palpable. Graduated compression hose are in place.   Gastrointestinal: soft, nontender, BS WNL, no r/g, no palpable masses.  Musculoskeletal: No muscle atrophy/wasting. M/S 4/5 throughout, Extremities without ischemic changes.  Skin: No rashes, no ulcers, no cellulitis.   Neurologic:  A&O X 3; appropriate affect, sensation is normal; speech is normal, CN 2-12 intact, pain and light touch intact in extremities, motor exam as listed above. Psychiatric: Normal thought content, mood appropriate to clinical situation     Assessment: OLGA BOURBEAU is a 83 y.o. female whois seen for left internal carotid artery stenosis with a known right ICA occlusion. She has no history of stroke or TIA.  She takes coumadin for hx of PE and atrial fib. She is statin intolerant, is morbidly obese. She smoked x 15 years, quit in 1988, does not have a diagnosis of DM   Her blood pressure at home this morning was 124/68, is elevated now, she is asymptomatic of this. I advised her to notify her PCP or cardiologist if her blood pressure remains >150/90 at home.   #1) Extracranial carotid artery stenosis:  remains stable with no neurological events.  #2) Chronic pain: managed by Dr. Nelva Bush  #3) Limited mobility: secondary to OA and fibromyalgia pain, which facilitates morbid obesity. Has received physical therapy several  times   DATA Carotid Duplex (11-11-17): Right ICA conformed occlusion. Left ICA: 60-79% stenosis Bilateral vertebral artery flow is antegrade.  Right subclavian artery waveforms are normal, left are turbulent.  No change compared to the exam on 05-06-17.     Plan: Follow-up in 6 months with Carotid Duplex scan.  I discussed in depth with the patient the nature of atherosclerosis, and emphasized the importance of maximal medical management including strict control of blood pressure, blood glucose, and lipid levels, obtaining regular exercise, and continued cessation of smoking.  The patient is aware that without maximal medical management the underlying atherosclerotic disease process will progress, limiting the benefit of any interventions. The patient was given information about stroke prevention and what symptoms should prompt the patient to seek immediate medical care. Thank you for allowing [place] to participate in this patient's care.  Clemon Chambers, RN, MSN, FNP-C [facility] Office: [phone]  Clinic Physician: Early  11/11/17 2:09 PM

## 2017-11-11 NOTE — Patient Instructions (Signed)

## 2017-11-12 DIAGNOSIS — H401113 Primary open-angle glaucoma, right eye, severe stage: Secondary | ICD-10-CM | POA: Diagnosis not present

## 2017-11-12 DIAGNOSIS — H40052 Ocular hypertension, left eye: Secondary | ICD-10-CM | POA: Diagnosis not present

## 2017-11-18 DIAGNOSIS — Z23 Encounter for immunization: Secondary | ICD-10-CM | POA: Diagnosis not present

## 2017-11-19 DIAGNOSIS — I10 Essential (primary) hypertension: Secondary | ICD-10-CM | POA: Diagnosis not present

## 2017-11-19 DIAGNOSIS — I509 Heart failure, unspecified: Secondary | ICD-10-CM | POA: Diagnosis not present

## 2017-11-22 NOTE — Progress Notes (Signed)
Cardiology Office Note   Date:  11/22/2017   ID:  [name], DOB Dec 13, 1934, MRN [id number]  PCP:  Deland Pretty, MD  Cardiologist: Zuriel Roskos [place] MD  No chief complaint on file.     History of Present Illness: [name] is a 83 y.o. female who presents for follow up CHF and atrial fibrillation.    She was admitted to the hospital in February 2015 because of new onset atrial fibrillation. She was already on long-term Coumadin. Diltiazem for rate control was added to her regimen in the hospital. The patient has a past history of pulmonary emboli and is on long-term Coumadin. She has a history of essential hypertension and a history of right carotid artery occlusion. 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 Donnetta Hutching. Last doppler in October 8527 showed LICA stenosis of 78-24%. Now followed at [facility] at 6 month interrval.  She does not have any significant coronary disease and she had cardiac catheterization in 1994 showing only mild plaque and she had a nuclear stress test in December 2011 showing no ischemia and her ejection fraction was 78%.   On a prior visit she complained of marked fatigue so we discontinued her Lozol without much change. She was maintained on Bystolic and prn lasix.   On follow up today she has a number of complaints but not much from a cardiac standpoint. Denies any dyspnea or chest pain. No palpitations or dizziness. She has a lot of weakness in her legs and poor balance. Getting home PT. Had a spinal injection by Dr. Nelva Bush. 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 Was told magnesium was low but supplements caused too much diarrhea.      Past Medical History:  Diagnosis Date  . Atrial fibrillation ([place])   . Carotid artery occlusion   . Chronic diastolic CHF (congestive heart failure) (HCC)    Hypertensive heart disease 02-12-14  . Crohn's disease ([place])   . Detached retina   .  Fibromyalgia   . GERD (gastroesophageal reflux disease)   . Gout   . H/O hiatal hernia   . High triglycerides   . History of stomach ulcers 1950's  . Hypertension   . Long term current use of anticoagulant   . Obstructive sleep apnea on CPAP   . Osteoarthritis    PAIN AND OA LEF T HIP AND BOTH SHOULDERS ARE BONE ON BONE AND PAINFUL  . Peripheral vascular disease (HCC)    KNOWN RIGHT INTERNAL CAROTID ARTERY OCCLUSION (NO STROKE)  --40 TO 59% STENOSIS LEFT ICA-FOLLOWED BY DR. EARLY WITH DOPPLER STUDY EVERY 6 MONTHS  . Pulmonary embolism ([place]) 2011   a. Hx of PE in 02/2009 after R hip surgery, venous dopplers negative, long-term Coumadin.  [name] Kitchen PVC (premature ventricular contraction)    PT STATES HX OF PVC'S ON EKG  . Recurrent upper respiratory infection (URI)    BRONCHITIS FEB 2013--SLIGHT COUGH NON-PRODUCTIVE NOW  . Recurrent UTI (urinary tract infection)    "on daily medicine" (05/14/2012)  . Tinnitus   . Vertigo     Past Surgical History:  Procedure Laterality Date  . APPENDECTOMY  1950's  . BACK SURGERY  10/29/06   Central and foraminal decompression L3-L4, L4-L5, and L5-S1 with inspection of L4-L5 and L5-S1 disc on the right  . CARDIAC CATHETERIZATION  1970's   "maybe 2" (05/14/2012)  . CARDIAC CATHETERIZATION    .  CATARACT EXTRACTION W/ INTRAOCULAR LENS IMPLANT Right 2009  . CHOLECYSTECTOMY  ~ 1988  . DECOMPRESSIVE LUMBAR LAMINECTOMY LEVEL 3  ~ 2002  . DILATION AND CURETTAGE OF UTERUS     "several; from miscarriages" (05/14/2012)  . EXCISION MORTON'S NEUROMA Right 05/20/00   "foot" (05/14/2012)  . EYE SURGERY Left 2014   Detached retina  . EYE SURGERY Left August 23, 2013   Cataract  . FEMUR FRACTURE SURGERY Right 02/21/09   Open reduction internal fixation of right periprosthetic  femur fracture utilizing Zimmer cables times fiv  . FRACTURE SURGERY  2011   Right Femur Fx  . HAMMER TOE SURGERY Left 10/25/08   "toe next to big to" (05/14/2012)  . JOINT REPLACEMENT  2009   Right Hip  replacement  . JOINT REPLACEMENT  2012   Right knee replacement  . JOINT REPLACEMENT  06/17/11   Left Hip replacement  . KNEE ARTHROSCOPY Right 07/26/05  . REPLACEMENT TOTAL KNEE Right 02/19/2010  . SPINE SURGERY    . [place]?  . TOTAL HIP ARTHROPLASTY Right 12/08/02   Osteonics total hip replacement  . TOTAL HIP ARTHROPLASTY  06/17/2011   Procedure: TOTAL HIP ARTHROPLASTY;  Surgeon: Gearlean Alf, MD;  Location: [facility];  Service: Orthopedics;  Laterality: Left;  . TOTAL SHOULDER ARTHROPLASTY Left 05/14/2012  . TOTAL SHOULDER ARTHROPLASTY Left 05/14/2012   Procedure: TOTAL SHOULDER ARTHROPLASTY;  Surgeon: Marin Shutter, MD;  Location: [place];  Service: Orthopedics;  Laterality: Left;  [name] Kitchen VAGINAL HYSTERECTOMY  1971     Current Outpatient Medications  Medication Sig Dispense Refill  . allopurinol (ZYLOPRIM) 300 MG tablet Take 300 mg by mouth daily.    [name] Kitchen amLODipine (NORVASC) 2.5 MG tablet Take 2.5 mg by mouth daily.    . cephALEXin (KEFLEX) 500 MG capsule Take 1 capsule (500 mg total) by mouth 3 (three) times daily. 30 capsule 0  . cetirizine (ZYRTEC) 10 MG tablet Take 10 mg by mouth daily.    . clobetasol cream (TEMOVATE) 9.52 % Apply 1 application topically 2 (two) times daily.    [name] Kitchen CRANBERRY CONCENTRATE PO Take 1 tablet by mouth daily.    . dorzolamide-timolol (COSOPT) 22.3-6.8 MG/ML ophthalmic solution Place 1 drop into both eyes 2 (two) times daily.     . furosemide (LASIX) 20 MG tablet TAKE (1/2) TABLET DAILY. 45 tablet 0  . HYDROcodone-acetaminophen (NORCO) 10-325 MG per tablet Take 1 tablet by mouth every 6 (six) hours as needed (pain).     . indapamide (LOZOL) 2.5 MG tablet Take 2.5 mg by mouth daily.    [name] Kitchen ketoconazole (NIZORAL) 2 % cream Apply 1 application topically 2 (two) times daily as needed for irritation.     [name] Kitchen LUMIGAN 0.01 % SOLN Place 1 drop into both eyes at bedtime.    . mupirocin ointment (BACTROBAN) 2 % Apply 1 application topically 2 (two)  times daily. 30 g 2  . nebivolol (BYSTOLIC) 10 MG tablet Take 1 tablet (10 mg total) by mouth at bedtime. 90 tablet 3  . NONFORMULARY OR COMPOUNDED ITEM [facility]  Peripheral Neuropathy Cream- Bupivacaine 1%, Doxepin 3%, Gabapentin 6%, Pentoxifylline 3%, Topiramate 1% Apply 1-2 grams to affected area 3-4 times daily Qty. 120 gm 3 refills 1 each 0  . omeprazole (PRILOSEC) 40 MG capsule Take 40 mg by mouth daily.    . ondansetron (ZOFRAN ODT) 4 MG disintegrating tablet Take 1 tablet (4 mg total) by mouth every 8 (eight) hours as  needed for nausea or vomiting. 20 tablet 0  . potassium chloride (K-DUR,KLOR-CON) 10 MEQ tablet Take 10 mEq by mouth 2 (two) times daily.    . silver sulfADIAZINE (SILVADENE) 1 % cream Apply 1 application topically daily. 50 g 0  . traZODone (DESYREL) 50 MG tablet Take 50 mg by mouth at bedtime.    . triamcinolone (NASACORT ALLERGY 24HR) 55 MCG/ACT AERO nasal inhaler Place 2 sprays into the nose daily as needed.    . warfarin (COUMADIN) 2.5 MG tablet TAKE 1 TO 1&1/2 TABLETS DAILY AS DIRECTED BY COUMADIN CLINIC. 120 tablet 1   No current facility-administered medications for this visit.     Allergies:   Codeine; Cymbalta [duloxetine hcl]; Diltiazem cd [diltiazem hcl er beads]; Metoprolol; Nortriptyline; Penicillins; Statins; Tetanus toxoid adsorbed; Tetanus toxoids; Ciprofloxacin; Lyrica [pregabalin]; and Penicillin g    Social History:  The patient  reports that she quit smoking about 31 years ago. Her smoking use included cigarettes. She has a 7.50 pack-year smoking history. She has never used smokeless tobacco. She reports that she drinks alcohol. She reports that she does not use drugs.   Family History:  The patient's family history includes AAA (abdominal aortic aneurysm) in her mother; Aneurysm in her mother; Diabetes in her paternal grandfather and son; Heart attack in her brother; Heart attack (age of onset: 23) in her father; Heart disease in her  brother, father, and mother; Hyperlipidemia in her brother; Hypertension in her brother, brother, father, and mother; Peripheral vascular disease in her father; Stroke in her mother.    ROS:  Please see the history of present illness.   Otherwise, review of systems are positive for none.   All other systems are reviewed and negative.    PHYSICAL EXAM: VS:  There were no vitals taken for this visit. , BMI There is no height or weight on file to calculate BMI. GENERAL:  Well appearing morbidly obese WF in NAD HEENT:  PERRL, EOMI, sclera are clear. Oropharynx is clear. NECK:  No jugular venous distention, carotid upstroke brisk and symmetric, no bruits, no thyromegaly or adenopathy LUNGS:  Clear to auscultation bilaterally CHEST:  Unremarkable HEART:  IRRR, pulse 96 PMI not displaced or sustained,S1 and S2 within normal limits, no S3, no S4: no clicks, no rubs, no murmurs ABD:  Soft, nontender. BS +, no masses or bruits. No hepatomegaly, no splenomegaly EXT:  2 + pulses throughout, no edema, no cyanosis no clubbing SKIN:  Warm and dry.  No rashes NEURO:  Alert and oriented x 3. Cranial nerves II through XII intact. PSYCH:  Cognitively intact  EKG:  EKG is  ordered today. Afib with rate 108. Nonspecific ST-T wave abnormality. I have personally reviewed and interpreted this study.    Recent Labs: Labs dated 05/01/15: cholesterol 153, triglycerides 384, HDL 34, LDL 42. Glucose 150. CMET and CBC otherwise normal. Dated 05/15/16: cholesterol 129, triglycerides 270, HDL 33, LDL 42. A1c 5.9%.  Dated 10/17/16: normal CBC. Creatinine 1.1. Other chemistries normal.  Dated 04/22/17: Hbg 11.9. Creatinine 0.9. Other chemistries normal.  Dated 09/09/17: cholesterol 137, triglycerides 176, HDL 38, LDL 64. Chemistry panel normal.   Wt Readings from Last 3 Encounters:  11/11/17 219 lb (99.3 kg)  08/07/17 221 lb 6.4 oz (100.4 kg)  05/27/17 225 lb 6.4 oz (102.2 kg)        ASSESSMENT AND PLAN:  1.  Permanent atrial fibrillation on long-term anticoagulation. She is asymptomatic with good rate control on Bystolic. On long term  anticoagulation with Coumadin. Will check INR today. 2. History of pulmonary emboli, on warfarin 3. Essential hypertension with intolerance to metoprolol. Currently well controlled.  4. Chronic diastolic CHF. She is taking lasix 10 mg daily now with good volume control. Stressed importance of sodium restriction. Unfortunately she is unable to cook many days and either goes out or orders in.  5. Morbid obesity.  6. Carotid arterial disease. Occluded RCA. 89-84% LICA. Followed by [facility]. Will now be checking doppler every 6 months.   Current medicines are reviewed at length with the patient today.  The patient does not have concerns regarding medicines.  The following changes have been made:  no change  Labs/ tests ordered today include:  No orders of the defined types were placed in this encounter.    Disposition: as noted above.   Follow up in 6 months.  Signed, Lavoris Canizales [place] MD, [facility]    11/22/2017 5:20 PM

## 2017-11-24 ENCOUNTER — Ambulatory Visit (INDEPENDENT_AMBULATORY_CARE_PROVIDER_SITE_OTHER): Payer: Medicare Other | Admitting: Cardiology

## 2017-11-24 ENCOUNTER — Encounter: Payer: Self-pay | Admitting: Cardiology

## 2017-11-24 ENCOUNTER — Ambulatory Visit (INDEPENDENT_AMBULATORY_CARE_PROVIDER_SITE_OTHER): Payer: Medicare Other | Admitting: Pharmacist

## 2017-11-24 VITALS — BP 130/76 | HR 108 | Ht 62.0 in | Wt 220.0 lb

## 2017-11-24 DIAGNOSIS — I482 Chronic atrial fibrillation, unspecified: Secondary | ICD-10-CM | POA: Diagnosis not present

## 2017-11-24 DIAGNOSIS — I6521 Occlusion and stenosis of right carotid artery: Secondary | ICD-10-CM

## 2017-11-24 DIAGNOSIS — I6522 Occlusion and stenosis of left carotid artery: Secondary | ICD-10-CM | POA: Diagnosis not present

## 2017-11-24 DIAGNOSIS — I4891 Unspecified atrial fibrillation: Secondary | ICD-10-CM

## 2017-11-24 DIAGNOSIS — I6523 Occlusion and stenosis of bilateral carotid arteries: Secondary | ICD-10-CM | POA: Diagnosis not present

## 2017-11-24 LAB — POCT INR: INR: 2 (ref 2.0–3.0)

## 2017-11-24 NOTE — Patient Instructions (Signed)
Continue your current therapy  I will see you in 6 months.   

## 2017-11-25 ENCOUNTER — Encounter: Payer: Self-pay | Admitting: Podiatry

## 2017-11-25 ENCOUNTER — Ambulatory Visit (INDEPENDENT_AMBULATORY_CARE_PROVIDER_SITE_OTHER): Payer: Medicare Other | Admitting: Podiatry

## 2017-11-25 DIAGNOSIS — L84 Corns and callosities: Secondary | ICD-10-CM | POA: Diagnosis not present

## 2017-11-25 DIAGNOSIS — I6523 Occlusion and stenosis of bilateral carotid arteries: Secondary | ICD-10-CM | POA: Diagnosis not present

## 2017-11-26 NOTE — Progress Notes (Signed)
Subjective: 83 year old female presents the office today with her husband for follow-up evaluation of a wound on the left foot.  She states the area is doing better.  Denies any open sores denies any redness or drainage or any swelling.  Overall she is doing well she has no other concerns. Denies any systemic complaints such as fevers, chills, nausea, vomiting. No acute changes since last appointment, and no other complaints at this time.   Objective: AAO x3, NAD DP/PT pulses palpable bilaterally, CRT less than 3 seconds In the left foot submetatarsal 1 is a hyperkeratotic lesion.  Upon debridement appears that the wound continues to be healed.  There is no edema, erythema, drainage or pus there is no clinical signs of infection present.  There is no dried blood under the callus today that she has had previously. No pain with calf compression, swelling, warmth, erythema  Assessment: Pre-ulcerative callus left foot  Plan: -All treatment options discussed with the patient including all alternatives, risks, complications.  -Sharply debrided the hyperkeratotic lesion without any complications or bleeding.  I evaluated the orthotics today and appears to be offloading the area well.  Continue with this.  Moisturizer daily. -She was previously being seen every 3 weeks for the callus.  She goes longer noticed that the wound seems to recur.  Trula Slade DPM

## 2017-12-16 ENCOUNTER — Encounter: Payer: Self-pay | Admitting: Podiatry

## 2017-12-16 ENCOUNTER — Ambulatory Visit (INDEPENDENT_AMBULATORY_CARE_PROVIDER_SITE_OTHER): Payer: Medicare Other | Admitting: Podiatry

## 2017-12-16 DIAGNOSIS — L84 Corns and callosities: Secondary | ICD-10-CM | POA: Diagnosis not present

## 2017-12-16 DIAGNOSIS — Z7901 Long term (current) use of anticoagulants: Secondary | ICD-10-CM | POA: Diagnosis not present

## 2017-12-16 DIAGNOSIS — G609 Hereditary and idiopathic neuropathy, unspecified: Secondary | ICD-10-CM | POA: Diagnosis not present

## 2017-12-17 NOTE — Progress Notes (Signed)
Subjective: 83 year old female presents the office today with her husband for follow-up evaluation of a wound on the left foot.  They state that the area is doing well did not have any opening denies any drainage or any bleeding. Denies any systemic complaints such as fevers, chills, nausea, vomiting. No acute changes since last appointment, and no other complaints at this time.   She is on coumadin   Objective: AAO x3, NAD DP/PT pulses palpable bilaterally, CRT less than 3 seconds Sensation decreased with SWMF.  In the left foot submetatarsal 1 is a hyperkeratotic lesion.  There is no tripod and upon debridement there is no underlying ulceration, drainage or any signs of infection today.  Problems the metatarsal head plantarly. No pain with calf compression, swelling, warmth, erythema  Assessment: Pre-ulcerative callus left foot  Plan: -All treatment options discussed with the patient including all alternatives, risks, complications.  -Sharply debrided the hyperkeratotic lesion without any complications or bleeding.  I evaluated the orthotics today and appears to be offloading the area well.  Continue with this.  Moisturizer daily. -She was previously being seen every 3 weeks for the callus.  She goes longer noticed that the wound seems to recur.  Trula Slade DPM

## 2017-12-22 ENCOUNTER — Ambulatory Visit (INDEPENDENT_AMBULATORY_CARE_PROVIDER_SITE_OTHER): Payer: Medicare Other | Admitting: Pharmacist Clinician (PhC)/ Clinical Pharmacy Specialist

## 2017-12-22 DIAGNOSIS — I4891 Unspecified atrial fibrillation: Secondary | ICD-10-CM | POA: Diagnosis not present

## 2017-12-22 DIAGNOSIS — Z7901 Long term (current) use of anticoagulants: Secondary | ICD-10-CM | POA: Diagnosis not present

## 2017-12-22 LAB — POCT INR: INR: 2.1 (ref 2.0–3.0)

## 2017-12-22 NOTE — Patient Instructions (Signed)
Description   Continue with 1 tablet daily except 1.5 tablets each Monday, Wednesday and Friday.  Repeat INR in 4 weeks.    Call with questions [phone]

## 2017-12-29 DIAGNOSIS — M5136 Other intervertebral disc degeneration, lumbar region: Secondary | ICD-10-CM | POA: Diagnosis not present

## 2017-12-29 DIAGNOSIS — M961 Postlaminectomy syndrome, not elsewhere classified: Secondary | ICD-10-CM | POA: Diagnosis not present

## 2017-12-29 DIAGNOSIS — M545 Low back pain: Secondary | ICD-10-CM | POA: Diagnosis not present

## 2017-12-29 DIAGNOSIS — G894 Chronic pain syndrome: Secondary | ICD-10-CM | POA: Diagnosis not present

## 2018-01-02 DIAGNOSIS — N393 Stress incontinence (female) (male): Secondary | ICD-10-CM | POA: Diagnosis not present

## 2018-01-02 DIAGNOSIS — Z86711 Personal history of pulmonary embolism: Secondary | ICD-10-CM | POA: Diagnosis not present

## 2018-01-02 DIAGNOSIS — N3020 Other chronic cystitis without hematuria: Secondary | ICD-10-CM | POA: Diagnosis not present

## 2018-01-06 ENCOUNTER — Ambulatory Visit: Payer: Medicare Other | Admitting: Podiatry

## 2018-01-07 ENCOUNTER — Other Ambulatory Visit: Payer: Self-pay | Admitting: Cardiology

## 2018-01-15 DIAGNOSIS — G4701 Insomnia due to medical condition: Secondary | ICD-10-CM | POA: Diagnosis not present

## 2018-01-19 ENCOUNTER — Ambulatory Visit (INDEPENDENT_AMBULATORY_CARE_PROVIDER_SITE_OTHER): Payer: Medicare Other | Admitting: Pharmacist Clinician (PhC)/ Clinical Pharmacy Specialist

## 2018-01-19 DIAGNOSIS — Z7901 Long term (current) use of anticoagulants: Secondary | ICD-10-CM

## 2018-01-19 DIAGNOSIS — I4891 Unspecified atrial fibrillation: Secondary | ICD-10-CM | POA: Diagnosis not present

## 2018-01-19 LAB — POCT INR: INR: 1.9 — AB (ref 2.0–3.0)

## 2018-01-19 NOTE — Patient Instructions (Signed)
Description   Continue with 1 tablet daily except 1.5 tablets each Monday, Wednesday and Friday.  Repeat INR in 4 weeks.    Call with questions [phone]

## 2018-01-20 ENCOUNTER — Ambulatory Visit (INDEPENDENT_AMBULATORY_CARE_PROVIDER_SITE_OTHER): Payer: Medicare Other | Admitting: Podiatry

## 2018-01-20 ENCOUNTER — Encounter: Payer: Self-pay | Admitting: Podiatry

## 2018-01-20 DIAGNOSIS — I6523 Occlusion and stenosis of bilateral carotid arteries: Secondary | ICD-10-CM

## 2018-01-20 DIAGNOSIS — G609 Hereditary and idiopathic neuropathy, unspecified: Secondary | ICD-10-CM | POA: Diagnosis not present

## 2018-01-20 DIAGNOSIS — L84 Corns and callosities: Secondary | ICD-10-CM | POA: Diagnosis not present

## 2018-01-21 NOTE — Progress Notes (Signed)
Subjective: 83 year old female presents the office today with her husband for follow-up evaluation of a wound on the left foot.  States that she is doing well and has not had any opening she denies any redness or drainage or any swelling she has no new concerns.    She is on coumadin   Objective: AAO x3, NAD DP/PT pulses palpable bilaterally, CRT less than 3 seconds Sensation decreased with SWMF.  In the left foot submetatarsal 1 is a hyperkeratotic lesion.  Upon debridement there is no underlying ulceration, drainage or any clinical signs of infection noted today. Prominent submetatarsal 1. No pain with calf compression, swelling, warmth, erythema  Assessment: Pre-ulcerative callus left foot  Plan: -All treatment options discussed with the patient including all alternatives, risks, complications.  -Sharply debrided the hyperkeratotic lesion without any complications or bleeding.  -She was previously being seen every 4 weeks for the callus.  If any issues before to let me know or if there is any worsening.  Trula Slade DPM

## 2018-02-18 ENCOUNTER — Ambulatory Visit (INDEPENDENT_AMBULATORY_CARE_PROVIDER_SITE_OTHER): Payer: Medicare Other | Admitting: Pharmacist

## 2018-02-18 DIAGNOSIS — I4891 Unspecified atrial fibrillation: Secondary | ICD-10-CM

## 2018-02-18 LAB — POCT INR: INR: 2.8 (ref 2.0–3.0)

## 2018-02-23 ENCOUNTER — Ambulatory Visit: Payer: Medicare Other | Admitting: Podiatry

## 2018-02-27 ENCOUNTER — Ambulatory Visit (INDEPENDENT_AMBULATORY_CARE_PROVIDER_SITE_OTHER): Payer: Medicare Other | Admitting: Podiatry

## 2018-02-27 DIAGNOSIS — L84 Corns and callosities: Secondary | ICD-10-CM | POA: Diagnosis not present

## 2018-02-27 DIAGNOSIS — G609 Hereditary and idiopathic neuropathy, unspecified: Secondary | ICD-10-CM

## 2018-02-27 DIAGNOSIS — Z7901 Long term (current) use of anticoagulants: Secondary | ICD-10-CM

## 2018-02-28 NOTE — Progress Notes (Signed)
Subjective: 84 year old female presents the office today with her husband for follow-up evaluation of a callus on the left foot.  States that she is doing well and has not had any opening.  She did have to miss her last appointment because she is sick but overall she is feeling better. She denies any redness or drainage or any swelling she has no new concerns.    She is on coumadin   Objective: AAO x3, NAD DP/PT pulses palpable bilaterally, CRT less than 3 seconds Sensation decreased with SWMF.  In the left foot submetatarsal 1 is a hyperkeratotic lesion.  Upon debridement there is no underlying ulceration, drainage or any clinical signs of infection noted today.  There was some evidence of some old dried blood underneath the area but there is no open sore identified. Prominent submetatarsal 1. No pain with calf compression, swelling, warmth, erythema  Assessment: Pre-ulcerative callus left foot  Plan: -All treatment options discussed with the patient including all alternatives, risks, complications.  -Sharply debrided the hyperkeratotic lesion without any complications or bleeding.  -She was previously being seen every 3 weeks for the callus.  If any issues before to let me know or if there is any worsening.  Trula Slade DPM

## 2018-03-04 DIAGNOSIS — J019 Acute sinusitis, unspecified: Secondary | ICD-10-CM | POA: Diagnosis not present

## 2018-03-04 DIAGNOSIS — I509 Heart failure, unspecified: Secondary | ICD-10-CM | POA: Diagnosis not present

## 2018-03-04 DIAGNOSIS — R05 Cough: Secondary | ICD-10-CM | POA: Diagnosis not present

## 2018-03-04 DIAGNOSIS — I10 Essential (primary) hypertension: Secondary | ICD-10-CM | POA: Diagnosis not present

## 2018-03-06 ENCOUNTER — Other Ambulatory Visit: Payer: Self-pay | Admitting: Cardiology

## 2018-03-17 DIAGNOSIS — Z961 Presence of intraocular lens: Secondary | ICD-10-CM | POA: Diagnosis not present

## 2018-03-17 DIAGNOSIS — S0511XS Contusion of eyeball and orbital tissues, right eye, sequela: Secondary | ICD-10-CM | POA: Diagnosis not present

## 2018-03-17 DIAGNOSIS — H47211 Primary optic atrophy, right eye: Secondary | ICD-10-CM | POA: Diagnosis not present

## 2018-03-17 DIAGNOSIS — H40052 Ocular hypertension, left eye: Secondary | ICD-10-CM | POA: Diagnosis not present

## 2018-03-17 DIAGNOSIS — Z7901 Long term (current) use of anticoagulants: Secondary | ICD-10-CM | POA: Diagnosis not present

## 2018-03-17 DIAGNOSIS — H462 Nutritional optic neuropathy: Secondary | ICD-10-CM | POA: Diagnosis not present

## 2018-03-17 DIAGNOSIS — H33052 Total retinal detachment, left eye: Secondary | ICD-10-CM | POA: Diagnosis not present

## 2018-03-17 DIAGNOSIS — H401112 Primary open-angle glaucoma, right eye, moderate stage: Secondary | ICD-10-CM | POA: Diagnosis not present

## 2018-03-17 DIAGNOSIS — K5090 Crohn's disease, unspecified, without complications: Secondary | ICD-10-CM | POA: Diagnosis not present

## 2018-03-18 ENCOUNTER — Ambulatory Visit (INDEPENDENT_AMBULATORY_CARE_PROVIDER_SITE_OTHER): Payer: Medicare Other | Admitting: Pharmacist Clinician (PhC)/ Clinical Pharmacy Specialist

## 2018-03-18 DIAGNOSIS — I4891 Unspecified atrial fibrillation: Secondary | ICD-10-CM | POA: Diagnosis not present

## 2018-03-18 DIAGNOSIS — Z7901 Long term (current) use of anticoagulants: Secondary | ICD-10-CM | POA: Diagnosis not present

## 2018-03-18 LAB — POCT INR: INR: 2.2 (ref 2.0–3.0)

## 2018-03-23 ENCOUNTER — Encounter: Payer: Self-pay | Admitting: Podiatry

## 2018-03-23 ENCOUNTER — Ambulatory Visit (INDEPENDENT_AMBULATORY_CARE_PROVIDER_SITE_OTHER): Payer: Medicare Other | Admitting: Podiatry

## 2018-03-23 DIAGNOSIS — Z7901 Long term (current) use of anticoagulants: Secondary | ICD-10-CM

## 2018-03-23 DIAGNOSIS — L84 Corns and callosities: Secondary | ICD-10-CM

## 2018-03-24 NOTE — Progress Notes (Signed)
Subjective: 84 year old female presents the office today with her husband for follow-up evaluation of a callus on the left foot.  She said that she is doing well she has not noticed any new issues to her feet.  Denies any swelling or redness or drainage or pus. She denies any redness or drainage or any swelling she has no new concerns.    She is on coumadin   Objective: AAO x3, NAD DP/PT pulses palpable bilaterally, CRT less than 3 seconds Sensation decreased with SWMF.  In the left foot submetatarsal 1 is a hyperkeratotic lesion.  Upon debridement there is no underlying ulceration, drainage or any clinical signs of infection noted today.  There is no evidence of blood underneath the callus today. Prominent submetatarsal 1. No pain with calf compression, swelling, warmth, erythema  Assessment: Pre-ulcerative callus left foot  Plan: -All treatment options discussed with the patient including all alternatives, risks, complications.  -Sharply debrided the hyperkeratotic lesion without any complications or bleeding.  -She was previously being seen every 3 weeks for the callus.  If she does not get this trimmed on a routine basis the area breaks down to the wound.  We have attempted offloading, padding without any significant improvement this is been a long-term issue for her.  If any issues before to let me know or if there is any worsening.  Trula Slade DPM

## 2018-03-26 DIAGNOSIS — R5383 Other fatigue: Secondary | ICD-10-CM | POA: Diagnosis not present

## 2018-03-26 DIAGNOSIS — R600 Localized edema: Secondary | ICD-10-CM | POA: Diagnosis not present

## 2018-04-10 DIAGNOSIS — H401112 Primary open-angle glaucoma, right eye, moderate stage: Secondary | ICD-10-CM | POA: Diagnosis not present

## 2018-04-10 DIAGNOSIS — Z961 Presence of intraocular lens: Secondary | ICD-10-CM | POA: Diagnosis not present

## 2018-04-10 DIAGNOSIS — H01026 Squamous blepharitis left eye, unspecified eyelid: Secondary | ICD-10-CM | POA: Diagnosis not present

## 2018-04-10 DIAGNOSIS — H40052 Ocular hypertension, left eye: Secondary | ICD-10-CM | POA: Diagnosis not present

## 2018-04-10 DIAGNOSIS — H01023 Squamous blepharitis right eye, unspecified eyelid: Secondary | ICD-10-CM | POA: Diagnosis not present

## 2018-04-13 DIAGNOSIS — I5032 Chronic diastolic (congestive) heart failure: Secondary | ICD-10-CM | POA: Diagnosis not present

## 2018-04-13 DIAGNOSIS — R0982 Postnasal drip: Secondary | ICD-10-CM | POA: Diagnosis not present

## 2018-04-15 ENCOUNTER — Ambulatory Visit (INDEPENDENT_AMBULATORY_CARE_PROVIDER_SITE_OTHER): Payer: Medicare Other | Admitting: Pharmacist Clinician (PhC)/ Clinical Pharmacy Specialist

## 2018-04-15 DIAGNOSIS — I4891 Unspecified atrial fibrillation: Secondary | ICD-10-CM | POA: Diagnosis not present

## 2018-04-15 LAB — POCT INR: INR: 2.5 (ref 2.0–3.0)

## 2018-04-20 ENCOUNTER — Ambulatory Visit (INDEPENDENT_AMBULATORY_CARE_PROVIDER_SITE_OTHER): Payer: Medicare Other | Admitting: Podiatry

## 2018-04-20 ENCOUNTER — Encounter: Payer: Self-pay | Admitting: Podiatry

## 2018-04-20 DIAGNOSIS — L84 Corns and callosities: Secondary | ICD-10-CM | POA: Diagnosis not present

## 2018-04-20 DIAGNOSIS — Z7901 Long term (current) use of anticoagulants: Secondary | ICD-10-CM

## 2018-04-20 DIAGNOSIS — I10 Essential (primary) hypertension: Secondary | ICD-10-CM | POA: Diagnosis not present

## 2018-04-22 NOTE — Progress Notes (Signed)
Subjective: 84 year old female presents the office today with her husband for follow-up evaluation of a callus on the left foot.  She denies any redness or drainage or any swelling in the area is been doing well.  She has no other concerns today.  Denies any recent fevers, chills, nausea, vomiting.  No calf pain, chest pain, shortness of breath.  She is on coumadin   Objective: AAO x3, NAD DP/PT pulses palpable bilaterally, CRT less than 3 seconds Sensation decreased with SWMF.  In the left foot submetatarsal 1 is a hyperkeratotic lesion.  Upon debridement there is no underlying ulceration, drainage or any clinical signs of infection noted today.  There is no evidence of blood underneath the callus today. Prominent submetatarsal 1. No pain with calf compression, swelling, warmth, erythema  Assessment: Pre-ulcerative callus left foot  Plan: -All treatment options discussed with the patient including all alternatives, risks, complications.  -Sharply debrided the hyperkeratotic lesion without any complications or bleeding.  -RTC 4 weeks  Trula Slade DPM

## 2018-05-11 DIAGNOSIS — E876 Hypokalemia: Secondary | ICD-10-CM | POA: Diagnosis not present

## 2018-05-12 ENCOUNTER — Ambulatory Visit: Payer: Medicare Other | Admitting: Family

## 2018-05-12 ENCOUNTER — Encounter (HOSPITAL_COMMUNITY): Payer: Medicare Other

## 2018-05-12 ENCOUNTER — Ambulatory Visit: Payer: Medicare Other | Admitting: Vascular Surgery

## 2018-05-19 ENCOUNTER — Ambulatory Visit: Payer: Medicare Other | Admitting: Podiatry

## 2018-05-19 ENCOUNTER — Other Ambulatory Visit: Payer: Self-pay | Admitting: Pharmacist

## 2018-05-19 MED ORDER — NEBIVOLOL HCL 10 MG PO TABS: 10.0000 mg | ORAL_TABLET | Freq: Every day | ORAL | 3 refills | Status: AC

## 2018-05-20 ENCOUNTER — Encounter: Payer: Self-pay | Admitting: *Deleted

## 2018-05-20 ENCOUNTER — Telehealth: Payer: Self-pay | Admitting: *Deleted

## 2018-05-20 NOTE — Telephone Encounter (Signed)
   Cardiac Questionnaire:    Since your last visit or hospitalization:    1. Have you been having new or worsening chest pain? NO   2. Have you been having new or worsening shortness of breath? NO 3. Have you been having new or worsening leg swelling, wt gain, or increase in abdominal girth (pants fitting more tightly)? NO   4. Have you had any passing out spells? NO    *A YES to any of these questions would result in the appointment being kept. *If all the answers to these questions are NO, we should indicate that given the current situation regarding the worldwide coronarvirus pandemic, at the recommendation of the CDC, we are looking to limit gatherings in our waiting area, and thus will reschedule their appointment beyond four weeks from today.   _____________   IWLNL-89 Pre-Screening Questions:  . Do you currently have a fever? NO . Have you recently travelled on a cruise, internationally, or to [place], [place], [place], [place], [place], or [place], [place] [employer])? NO . Have you been in contact with someone that is currently pending confirmation of Covid19 testing or has been confirmed to have the [place] virus?  NO . Are you currently experiencing fatigue or cough? NO    Spoke with patient and she is aware of her new appointment time 1:20 pm with Dr. Martinique on Friday May 22, 2018. We went over patient's medications, pharmacy, and history.

## 2018-05-21 ENCOUNTER — Telehealth: Payer: Self-pay | Admitting: Cardiology

## 2018-05-21 NOTE — Telephone Encounter (Signed)
Smart phone/Declined MyChart/pre reg complete. 05-21-18. ST

## 2018-05-22 ENCOUNTER — Other Ambulatory Visit: Payer: Self-pay

## 2018-05-22 ENCOUNTER — Telehealth (INDEPENDENT_AMBULATORY_CARE_PROVIDER_SITE_OTHER): Payer: Medicare Other | Admitting: Cardiology

## 2018-05-22 VITALS — BP 124/65 | HR 91 | Ht 62.0 in | Wt 221.0 lb

## 2018-05-22 DIAGNOSIS — I5032 Chronic diastolic (congestive) heart failure: Secondary | ICD-10-CM

## 2018-05-22 DIAGNOSIS — I482 Chronic atrial fibrillation, unspecified: Secondary | ICD-10-CM | POA: Diagnosis not present

## 2018-05-22 DIAGNOSIS — I6523 Occlusion and stenosis of bilateral carotid arteries: Secondary | ICD-10-CM

## 2018-05-22 NOTE — Patient Instructions (Signed)
Medication Instructions:  Continue same medications If you need a refill on your cardiac medications before your next appointment, please call your pharmacy.   Lab work: None ordered   Testing/Procedures: None ordered  Follow-Up: At [facility], you and your health needs are our priority.  As part of our continuing mission to provide you with exceptional heart care, we have created designated Provider Care Teams.  These Care Teams include your primary Cardiologist (physician) and Advanced Practice Providers (APPs -  Physician Assistants and Nurse Practitioners) who all work together to provide you with the care you need, when you need it. . Schedule follow up appointment with Dr.Jordan in 6 months    Call 3 months before to schedule.

## 2018-05-22 NOTE — Progress Notes (Signed)
Virtual Visit via Telephone Note   This visit type was conducted due to national recommendations for restrictions regarding the COVID-19 Pandemic (e.g. social distancing) in an effort to limit this patient's exposure and mitigate transmission in our community.  Due to her co-morbid illnesses, this patient is at least at moderate risk for complications without adequate follow up.  This format is felt to be most appropriate for this patient at this time.  The patient did not have access to video technology/had technical difficulties with video requiring transitioning to audio format only (telephone).  All issues noted in this document were discussed and addressed.  No physical exam could be performed with this format.  Please refer to the patient's chart for her  consent to telehealth for [facility].   Evaluation Performed:  Follow-up visit  Date:  05/22/2018   ID:  [name], DOB Jun 27, 1934, MRN [id number]  Patient Location: Home  Provider Location: Office  PCP:  Deland Pretty, MD  Cardiologist:  Veneda Kirksey Martinique MD Electrophysiologist:  None   Chief Complaint:  Swelling   History of Present Illness:    [name] is a 84 y.o. female who presents via audio/video conferencing for a telehealth visit today.  She has a history of CHF and atrial fibrillation.    She was admitted to the hospital in February 2015 because of new onset atrial fibrillation. She was already on long-term Coumadin. Diltiazem for rate control was added to her regimen in the hospital. The patient has a past history of pulmonary emboli. She has a history of essential hypertension. 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 Donnetta Hutching. Last doppler in October 2197 showed LICA stenosis of 58-83%. Now followed at [facility] at 6 month interrval. She does not have any significant coronary disease and she had cardiac catheterization in 1994 showing only  mild plaque and she had a nuclear stress test in December 2011 showing no ischemia and her ejection fraction was 78%.   On our visit today she is doing OK. Was started on Farxiga by Dr. Shelia Media but could not afford it. Now on Invokana and Amiloride in an effort to reduce need for potassium and magnesium. Her swelling has been well controlled until this am. She was without power last night and couldn't use her CPAP so had to sleep in a recliner. No increase in dyspnea. Is on 4 eye drops for glaucoma. Denies palpitations, dizziness or chest pain.  The patient does not have symptoms concerning for COVID-19 infection (fever, chills, cough, or new shortness of breath).    Past Medical History:  Diagnosis Date   Atrial fibrillation (HCC)    Carotid artery occlusion    Chronic diastolic CHF (congestive heart failure) (HCC)    Hypertensive heart disease 02-12-14   Crohn's disease ([place])    Detached retina    Fibromyalgia    GERD (gastroesophageal reflux disease)    Gout    H/O hiatal hernia    High triglycerides    History of stomach ulcers 1950's   Hypertension    Long term current use of anticoagulant    Obstructive sleep apnea on CPAP    Osteoarthritis    PAIN AND OA LEF T HIP AND BOTH SHOULDERS ARE BONE ON BONE AND PAINFUL   Peripheral vascular disease (HCC)    KNOWN RIGHT INTERNAL CAROTID ARTERY OCCLUSION (NO STROKE)  --40 TO 59% STENOSIS  LEFT ICA-FOLLOWED BY DR. EARLY WITH DOPPLER STUDY EVERY 6 MONTHS   Pulmonary embolism ([place]) 2011   a. Hx of PE in 02/2009 after R hip surgery, venous dopplers negative, long-term Coumadin.   PVC (premature ventricular contraction)    PT STATES HX OF PVC'S ON EKG   Recurrent upper respiratory infection (URI)    BRONCHITIS FEB 2013--SLIGHT COUGH NON-PRODUCTIVE NOW   Recurrent UTI (urinary tract infection)    "on daily medicine" (05/14/2012)   Tinnitus    Vertigo    Past Surgical History:  Procedure Laterality Date   APPENDECTOMY   1950's   BACK SURGERY  10/29/06   Central and foraminal decompression L3-L4, L4-L5, and L5-S1 with inspection of L4-L5 and L5-S1 disc on the right   CARDIAC CATHETERIZATION  1970's   "maybe 2" (05/14/2012)   CARDIAC CATHETERIZATION     CATARACT EXTRACTION W/ INTRAOCULAR LENS IMPLANT Right 2009   CHOLECYSTECTOMY  ~ [place] LEVEL 3  ~ 2002   DILATION AND CURETTAGE OF UTERUS     "several; from miscarriages" (05/14/2012)   EXCISION MORTON'S NEUROMA Right 05/20/00   "foot" (05/14/2012)   EYE SURGERY Left 2014   Detached retina   EYE SURGERY Left August 23, 2013   Cataract   FEMUR FRACTURE SURGERY Right 02/21/09   Open reduction internal fixation of right periprosthetic  femur fracture utilizing Zimmer cables times fiv   FRACTURE SURGERY  2011   Right Femur Fx   HAMMER TOE SURGERY Left 10/25/08   "toe next to big to" (05/14/2012)   JOINT REPLACEMENT  2009   Right Hip replacement   JOINT REPLACEMENT  2012   Right knee replacement   JOINT REPLACEMENT  06/17/11   Left Hip replacement   KNEE ARTHROSCOPY Right 07/26/05   REPLACEMENT TOTAL KNEE Right 02/19/2010   [place]?   TOTAL HIP ARTHROPLASTY Right 12/08/02   Osteonics total hip replacement   TOTAL HIP ARTHROPLASTY  06/17/2011   Procedure: TOTAL HIP ARTHROPLASTY;  Surgeon: Gearlean Alf, MD;  Location: [facility];  Service: Orthopedics;  Laterality: Left;   TOTAL SHOULDER ARTHROPLASTY Left 05/14/2012   TOTAL SHOULDER ARTHROPLASTY Left 05/14/2012   Procedure: TOTAL SHOULDER ARTHROPLASTY;  Surgeon: Marin Shutter, MD;  Location: [place];  Service: Orthopedics;  Laterality: Left;   VAGINAL HYSTERECTOMY  1971     Current Meds  Medication Sig   allopurinol (ZYLOPRIM) 300 MG tablet Take 300 mg by mouth daily.   aMILoride (MIDAMOR) 5 MG tablet Take 5 mg by mouth daily before breakfast.   amLODipine (NORVASC) 2.5 MG tablet Take 2.5 mg by mouth daily.    brimonidine (ALPHAGAN) 0.2 % ophthalmic solution 1 drop 2 (two) times daily.    celecoxib (CELEBREX) 100 MG capsule    cetirizine (ZYRTEC) 10 MG tablet Take 10 mg by mouth daily.   clobetasol cream (TEMOVATE) 2.72 % Apply 1 application topically 2 (two) times daily.   CRANBERRY CONCENTRATE PO Take 1 tablet by mouth daily.   dorzolamide-timolol (COSOPT) 22.3-6.8 MG/ML ophthalmic solution Place 1 drop into both eyes 2 (two) times daily.    furosemide (LASIX) 20 MG tablet TAKE (1/2) TABLET DAILY.   HYDROcodone-acetaminophen (NORCO/VICODIN) 5-325 MG tablet Take 1 tablet by mouth every 6 (six) hours as needed for moderate pain.   hydrocortisone 2.5 % ointment    indapamide (LOZOL) 2.5 MG tablet Take 2.5 mg by mouth daily.   INVOKANA 300  MG TABS tablet Place 300 mg into the right eye daily.   ipratropium (ATROVENT) 0.06 % nasal spray Place 1 spray into both nostrils as needed.   ketoconazole (NIZORAL) 2 % cream Apply 1 application topically 2 (two) times daily as needed for irritation.    LUMIGAN 0.01 % SOLN Place 1 drop into both eyes at bedtime.   mupirocin ointment (BACTROBAN) 2 % Apply 1 application topically 2 (two) times daily.   nebivolol (BYSTOLIC) 10 MG tablet Take 1 tablet (10 mg total) by mouth at bedtime.   NONFORMULARY OR COMPOUNDED ITEM [facility]  Peripheral Neuropathy Cream- Bupivacaine 1%, Doxepin 3%, Gabapentin 6%, Pentoxifylline 3%, Topiramate 1% Apply 1-2 grams to affected area 3-4 times daily Qty. 120 gm 3 refills   omeprazole (PRILOSEC) 40 MG capsule Take 40 mg by mouth daily.   ondansetron (ZOFRAN ODT) 4 MG disintegrating tablet Take 1 tablet (4 mg total) by mouth every 8 (eight) hours as needed for nausea or vomiting.   potassium chloride (K-DUR,KLOR-CON) 10 MEQ tablet Take 10 mEq by mouth 2 (two) times daily.   RHOPRESSA 0.02 % SOLN Place 1 drop into the right eye at bedtime.   silver sulfADIAZINE (SILVADENE) 1 % cream Apply 1 application  topically daily.   traZODone (DESYREL) 50 MG tablet Take 50 mg by mouth at bedtime.   triamcinolone (NASACORT ALLERGY 24HR) 55 MCG/ACT AERO nasal inhaler Place 2 sprays into the nose daily as needed.   warfarin (COUMADIN) 2.5 MG tablet TAKE 1 TO 1&1/2 TABLETS DAILY AS DIRECTED BY COUMADIN CLINIC.     Allergies:   Codeine; Cymbalta [duloxetine hcl]; Diltiazem cd [diltiazem hcl er beads]; Duloxetine; Metoprolol; Nortriptyline; Penicillins; Statins; Tetanus toxoid adsorbed; Tetanus toxoids; Ciprofloxacin; Lyrica [pregabalin]; and Penicillin g   Social History   Tobacco Use   Smoking status: Former Smoker    Packs/day: 0.50    Years: 15.00    Pack years: 7.50    Types: Cigarettes    Last attempt to quit: 02/11/1986    Years since quitting: 32.2   Smokeless tobacco: Never Used  Substance Use Topics   Alcohol use: Yes    Alcohol/week: 0.0 standard drinks    Comment: 05/14/2012 "have 2-3 drinks/yr" maybe   Drug use: No     Family Hx: The patient's family history includes AAA (abdominal aortic aneurysm) in her mother; Aneurysm in her mother; Diabetes in her paternal grandfather and son; Heart attack in her brother; Heart attack (age of onset: 40) in her father; Heart disease in her brother, father, and mother; Hyperlipidemia in her brother; Hypertension in her brother, brother, father, and mother; Peripheral vascular disease in her father; Stroke in her mother.  ROS:   Please see the history of present illness.    All other systems reviewed and are negative.   Prior CV studies:   The following studies were reviewed today:  none  Labs/Other Tests and Data Reviewed:    EKG:  No ECG reviewed.  Recent Labs: No results found for requested labs within last 8760 hours.   Dated 09/09/17: cholesterol 137, triglycerides 176, HDL 38, LDL 64. Chemistry panel normal.  Recent Lipid Panel No results found for: CHOL, TRIG, HDL, CHOLHDL, LDLCALC, LDLDIRECT  Wt Readings from Last 3  Encounters:  05/22/18 221 lb (100.2 kg)  11/24/17 220 lb (99.8 kg)  11/11/17 219 lb (99.3 kg)     Objective:    Vital Signs:  BP 124/65    Pulse 91    Ht 5'  2" (1.575 m)    Wt 221 lb (100.2 kg)    SpO2 92%    BMI 40.42 kg/m     ASSESSMENT & PLAN:    1. Permanent atrial fibrillation on long-term anticoagulation. She is asymptomatic with good rate control on Bystolic. On long term anticoagulation with Coumadin. followed in coumadin clinic 2. History of pulmonary emboli, on warfarin 3. Essential hypertension with intolerance to metoprolol. Currently well controlled.  4. Chronic diastolic CHF. She is taking lasix 10 mg daily now. Agree with addition of Invokana and amiloride. Hopefully this will enable continued control of edema with less impact on electrolytes.  5. Morbid obesity.  6. Carotid arterial disease. Occluded RCA. 07-86% LICA. Followed by [facility].   COVID-19 Education: The signs and symptoms of COVID-19 were discussed with the patient and how to seek care for testing (follow up with PCP or arrange E-visit).  The importance of social distancing was discussed today.  Time:   Today, I have spent 10 minutes with the patient with telehealth technology discussing the above problems.     Medication Adjustments/Labs and Tests Ordered: Current medicines are reviewed at length with the patient today.  Concerns regarding medicines are outlined above.  Tests Ordered: No orders of the defined types were placed in this encounter.  Medication Changes: No orders of the defined types were placed in this encounter.   Disposition:  Follow up in 6 month(s)  Signed, Aeliana Spates Martinique, MD  05/22/2018 10:07 AM    [facility]

## 2018-05-28 ENCOUNTER — Telehealth: Payer: Self-pay

## 2018-05-28 NOTE — Telephone Encounter (Signed)
lmom for prescreen  

## 2018-05-29 ENCOUNTER — Ambulatory Visit (INDEPENDENT_AMBULATORY_CARE_PROVIDER_SITE_OTHER): Payer: Medicare Other | Admitting: *Deleted

## 2018-05-29 ENCOUNTER — Other Ambulatory Visit: Payer: Self-pay

## 2018-05-29 DIAGNOSIS — I4891 Unspecified atrial fibrillation: Secondary | ICD-10-CM

## 2018-05-29 DIAGNOSIS — Z5181 Encounter for therapeutic drug level monitoring: Secondary | ICD-10-CM

## 2018-05-29 LAB — POCT INR: INR: 2.5 (ref 2.0–3.0)

## 2018-06-09 ENCOUNTER — Other Ambulatory Visit: Payer: Self-pay

## 2018-06-09 ENCOUNTER — Other Ambulatory Visit: Payer: Self-pay | Admitting: Cardiology

## 2018-06-09 ENCOUNTER — Ambulatory Visit (INDEPENDENT_AMBULATORY_CARE_PROVIDER_SITE_OTHER): Payer: Medicare Other | Admitting: Podiatry

## 2018-06-09 DIAGNOSIS — B351 Tinea unguium: Secondary | ICD-10-CM | POA: Diagnosis not present

## 2018-06-09 DIAGNOSIS — M79675 Pain in left toe(s): Secondary | ICD-10-CM | POA: Diagnosis not present

## 2018-06-09 DIAGNOSIS — Z7901 Long term (current) use of anticoagulants: Secondary | ICD-10-CM | POA: Diagnosis not present

## 2018-06-09 DIAGNOSIS — G609 Hereditary and idiopathic neuropathy, unspecified: Secondary | ICD-10-CM

## 2018-06-09 DIAGNOSIS — L84 Corns and callosities: Secondary | ICD-10-CM

## 2018-06-09 DIAGNOSIS — M79674 Pain in right toe(s): Secondary | ICD-10-CM

## 2018-06-10 NOTE — Progress Notes (Signed)
Subjective: 84 year old female presents the office today with her husband for follow-up evaluation of a callus on the left foot and for thick, elongated toenails that she cannot trim herself.  She denies any redness or drainage or any swelling in the area is been doing well.  She has no other concerns today.  Denies any recent fevers, chills, nausea, vomiting.  No calf pain, chest pain, shortness of breath.  She is on coumadin   Objective: AAO x3, NAD DP/PT pulses palpable bilaterally, CRT less than 3 seconds Sensation decreased with SWMF.  In the left foot submetatarsal 1 is a hyperkeratotic lesion.  Upon debridement there is no underlying ulceration, drainage or any clinical signs of infection noted today.  There is no evidence of blood underneath the callus today. Nails are hypertrophic, dystrophic, brittle, discolored, elongated 10. No surrounding redness or drainage. Tenderness nails 1-5 bilaterally. No open lesions or pre-ulcerative lesions are identified today. Prominent submetatarsal 1. No pain with calf compression, swelling, warmth, erythema  Assessment: Pre-ulcerative callus left foot; symptomatic onychomycosis   Plan: -All treatment options discussed with the patient including all alternatives, risks, complications.  -Sharply debrided the hyperkeratotic lesion without any complications or bleeding.  -Nails debrided x 10 without any complications or bleeding -Daily foot inspection discussed -RTC 4 weeks  Trula Slade DPM

## 2018-06-18 DIAGNOSIS — E785 Hyperlipidemia, unspecified: Secondary | ICD-10-CM | POA: Diagnosis not present

## 2018-06-18 DIAGNOSIS — N390 Urinary tract infection, site not specified: Secondary | ICD-10-CM | POA: Diagnosis not present

## 2018-06-18 DIAGNOSIS — I10 Essential (primary) hypertension: Secondary | ICD-10-CM | POA: Diagnosis not present

## 2018-06-18 DIAGNOSIS — E876 Hypokalemia: Secondary | ICD-10-CM | POA: Diagnosis not present

## 2018-07-09 ENCOUNTER — Telehealth: Payer: Self-pay

## 2018-07-09 NOTE — Telephone Encounter (Signed)
1. COVID-19 Pre-Screening Questions:  . In the past 7 to 10 days have you had a cough,  shortness of breath, headache, congestion, fever (100 or greater) body aches, chills, sore throat, or sudden loss of taste or sense of smell? No . Have you been around anyone with known Covid 19. No . Have you been around anyone who is awaiting Covid 19 test results in the past 7 to 10 days? No . Have you been around anyone who has been exposed to Covid 19, or has mentioned symptoms of Covid 19 within the past 7 to 10 days? No   2. Informed patient that her appointment will be moved to the [address] office. Patient is aware of drive thru for tomorrow

## 2018-07-09 NOTE — Telephone Encounter (Signed)
lmom to move location of appt to [address] but keep the same time instructed the pt to call the chst office back to screen and move locations

## 2018-07-10 ENCOUNTER — Ambulatory Visit (INDEPENDENT_AMBULATORY_CARE_PROVIDER_SITE_OTHER): Payer: Medicare Other

## 2018-07-10 ENCOUNTER — Other Ambulatory Visit: Payer: Self-pay

## 2018-07-10 DIAGNOSIS — I4891 Unspecified atrial fibrillation: Secondary | ICD-10-CM

## 2018-07-10 LAB — POCT INR: INR: 2 (ref 2.0–3.0)

## 2018-07-10 NOTE — Patient Instructions (Signed)
Description   Called spoke with pt, advised to continue on same dosage 1 tablet daily except 1.5 tablets on Mondays, Wednesdays and Fridays.  Repeat INR in 6 weeks.  Call with questions [phone]

## 2018-07-13 ENCOUNTER — Other Ambulatory Visit: Payer: Self-pay

## 2018-07-13 ENCOUNTER — Emergency Department (HOSPITAL_COMMUNITY): Payer: Medicare Other

## 2018-07-13 ENCOUNTER — Inpatient Hospital Stay (HOSPITAL_COMMUNITY)
Admission: EM | Admit: 2018-07-13 | Discharge: 2018-07-16 | DRG: 291 | Disposition: A | Discharge status: Home / Self Care | Payer: Medicare Other | Attending: Internal Medicine | Admitting: Internal Medicine

## 2018-07-13 ENCOUNTER — Encounter (HOSPITAL_COMMUNITY): Payer: Self-pay

## 2018-07-13 DIAGNOSIS — D649 Anemia, unspecified: Secondary | ICD-10-CM | POA: Diagnosis present

## 2018-07-13 DIAGNOSIS — Z20828 Contact with and (suspected) exposure to other viral communicable diseases: Secondary | ICD-10-CM | POA: Diagnosis present

## 2018-07-13 DIAGNOSIS — T502X5A Adverse effect of carbonic-anhydrase inhibitors, benzothiadiazides and other diuretics, initial encounter: Secondary | ICD-10-CM | POA: Diagnosis present

## 2018-07-13 DIAGNOSIS — M1990 Unspecified osteoarthritis, unspecified site: Secondary | ICD-10-CM | POA: Diagnosis present

## 2018-07-13 DIAGNOSIS — I482 Chronic atrial fibrillation, unspecified: Secondary | ICD-10-CM | POA: Diagnosis not present

## 2018-07-13 DIAGNOSIS — I10 Essential (primary) hypertension: Secondary | ICD-10-CM | POA: Diagnosis present

## 2018-07-13 DIAGNOSIS — Z7901 Long term (current) use of anticoagulants: Secondary | ICD-10-CM

## 2018-07-13 DIAGNOSIS — G4733 Obstructive sleep apnea (adult) (pediatric): Secondary | ICD-10-CM | POA: Diagnosis present

## 2018-07-13 DIAGNOSIS — I4891 Unspecified atrial fibrillation: Secondary | ICD-10-CM

## 2018-07-13 DIAGNOSIS — M109 Gout, unspecified: Secondary | ICD-10-CM | POA: Diagnosis present

## 2018-07-13 DIAGNOSIS — I110 Hypertensive heart disease with heart failure: Secondary | ICD-10-CM | POA: Diagnosis not present

## 2018-07-13 DIAGNOSIS — Z8249 Family history of ischemic heart disease and other diseases of the circulatory system: Secondary | ICD-10-CM

## 2018-07-13 DIAGNOSIS — R0902 Hypoxemia: Secondary | ICD-10-CM | POA: Diagnosis not present

## 2018-07-13 DIAGNOSIS — Z833 Family history of diabetes mellitus: Secondary | ICD-10-CM

## 2018-07-13 DIAGNOSIS — Z6837 Body mass index (BMI) 37.0-37.9, adult: Secondary | ICD-10-CM

## 2018-07-13 DIAGNOSIS — Z87440 Personal history of urinary (tract) infections: Secondary | ICD-10-CM

## 2018-07-13 DIAGNOSIS — Z86711 Personal history of pulmonary embolism: Secondary | ICD-10-CM

## 2018-07-13 DIAGNOSIS — J9621 Acute and chronic respiratory failure with hypoxia: Secondary | ICD-10-CM | POA: Diagnosis not present

## 2018-07-13 DIAGNOSIS — E876 Hypokalemia: Secondary | ICD-10-CM | POA: Diagnosis present

## 2018-07-13 DIAGNOSIS — R0689 Other abnormalities of breathing: Secondary | ICD-10-CM | POA: Diagnosis not present

## 2018-07-13 DIAGNOSIS — Z96643 Presence of artificial hip joint, bilateral: Secondary | ICD-10-CM | POA: Diagnosis present

## 2018-07-13 DIAGNOSIS — Z96651 Presence of right artificial knee joint: Secondary | ICD-10-CM | POA: Diagnosis present

## 2018-07-13 DIAGNOSIS — I509 Heart failure, unspecified: Secondary | ICD-10-CM | POA: Diagnosis not present

## 2018-07-13 DIAGNOSIS — E669 Obesity, unspecified: Secondary | ICD-10-CM | POA: Diagnosis present

## 2018-07-13 DIAGNOSIS — Z209 Contact with and (suspected) exposure to unspecified communicable disease: Secondary | ICD-10-CM | POA: Diagnosis not present

## 2018-07-13 DIAGNOSIS — J449 Chronic obstructive pulmonary disease, unspecified: Secondary | ICD-10-CM | POA: Diagnosis present

## 2018-07-13 DIAGNOSIS — E119 Type 2 diabetes mellitus without complications: Secondary | ICD-10-CM | POA: Diagnosis present

## 2018-07-13 DIAGNOSIS — Z823 Family history of stroke: Secondary | ICD-10-CM

## 2018-07-13 DIAGNOSIS — R0602 Shortness of breath: Secondary | ICD-10-CM | POA: Diagnosis not present

## 2018-07-13 DIAGNOSIS — I5033 Acute on chronic diastolic (congestive) heart failure: Secondary | ICD-10-CM | POA: Diagnosis not present

## 2018-07-13 DIAGNOSIS — Z87891 Personal history of nicotine dependence: Secondary | ICD-10-CM

## 2018-07-13 DIAGNOSIS — Z96612 Presence of left artificial shoulder joint: Secondary | ICD-10-CM | POA: Diagnosis present

## 2018-07-13 DIAGNOSIS — K219 Gastro-esophageal reflux disease without esophagitis: Secondary | ICD-10-CM | POA: Diagnosis present

## 2018-07-13 DIAGNOSIS — E785 Hyperlipidemia, unspecified: Secondary | ICD-10-CM | POA: Diagnosis present

## 2018-07-13 DIAGNOSIS — D696 Thrombocytopenia, unspecified: Secondary | ICD-10-CM | POA: Diagnosis present

## 2018-07-13 LAB — CBC WITH DIFFERENTIAL/PLATELET
Abs Immature Granulocytes: 0.09 10*3/uL — ABNORMAL HIGH (ref 0.00–0.07)
Basophils Absolute: 0 10*3/uL (ref 0.0–0.1)
Basophils Relative: 0 %
Eosinophils Absolute: 0.2 10*3/uL (ref 0.0–0.5)
Eosinophils Relative: 2 %
HCT: 39 % (ref 36.0–46.0)
Hemoglobin: 12.5 g/dL (ref 12.0–15.0)
Immature Granulocytes: 1 %
Lymphocytes Relative: 8 %
Lymphs Abs: 0.8 10*3/uL (ref 0.7–4.0)
MCH: 28.8 pg (ref 26.0–34.0)
MCHC: 32.1 g/dL (ref 30.0–36.0)
MCV: 89.9 fL (ref 80.0–100.0)
Monocytes Absolute: 0.7 10*3/uL (ref 0.1–1.0)
Monocytes Relative: 7 %
Neutro Abs: 7.9 10*3/uL — ABNORMAL HIGH (ref 1.7–7.7)
Neutrophils Relative %: 82 %
Platelets: 135 10*3/uL — ABNORMAL LOW (ref 150–400)
RBC: 4.34 MIL/uL (ref 3.87–5.11)
RDW: 17.4 % — ABNORMAL HIGH (ref 11.5–15.5)
WBC: 9.7 10*3/uL (ref 4.0–10.5)
nRBC: 0 % (ref 0.0–0.2)

## 2018-07-13 LAB — TROPONIN I: Troponin I: <0.03 ng/mL (ref <0.03)

## 2018-07-13 LAB — COMPREHENSIVE METABOLIC PANEL
ALT: 16 U/L (ref 0–44)
AST: 23 U/L (ref 15–41)
Albumin: 3.7 g/dL (ref 3.5–5.0)
Alkaline Phosphatase: 96 U/L (ref 38–126)
Anion gap: 12 (ref 5–15)
BUN: 11 mg/dL (ref 8–23)
CO2: 25 mmol/L (ref 22–32)
Calcium: 8.8 mg/dL — ABNORMAL LOW (ref 8.9–10.3)
Chloride: 104 mmol/L (ref 98–111)
Creatinine, Ser: 0.61 mg/dL (ref 0.44–1.00)
GFR calc Af Amer: >60 mL/min (ref >60)
GFR calc non Af Amer: >60 mL/min (ref >60)
Glucose, Bld: 131 mg/dL — ABNORMAL HIGH (ref 70–99)
Potassium: 3.4 mmol/L — ABNORMAL LOW (ref 3.5–5.1)
Sodium: 141 mmol/L (ref 135–145)
Total Bilirubin: 1.3 mg/dL — ABNORMAL HIGH (ref 0.3–1.2)
Total Protein: 7.5 g/dL (ref 6.5–8.1)

## 2018-07-13 LAB — BRAIN NATRIURETIC PEPTIDE: B Natriuretic Peptide: 249.4 pg/mL — ABNORMAL HIGH (ref 0.0–100.0)

## 2018-07-13 LAB — SARS CORONAVIRUS 2 BY RT PCR (HOSPITAL ORDER, PERFORMED IN ~~LOC~~ HOSPITAL LAB): SARS Coronavirus 2: NEGATIVE

## 2018-07-13 LAB — LACTIC ACID, PLASMA: Lactic Acid, Venous: 1.4 mmol/L (ref 0.5–1.9)

## 2018-07-13 LAB — PROTIME-INR
INR: 1.7 — ABNORMAL HIGH (ref 0.8–1.2)
Prothrombin Time: 20 seconds — ABNORMAL HIGH (ref 11.4–15.2)

## 2018-07-13 MED ORDER — AMLODIPINE BESYLATE 5 MG PO TABS
5.0000 mg | ORAL_TABLET | Freq: Once | ORAL | Status: AC
  Administered 2018-07-13: 5 mg via ORAL
  Filled 2018-07-13: qty 1

## 2018-07-13 MED ORDER — WARFARIN - PHARMACIST DOSING INPATIENT: Freq: Every day | Status: DC

## 2018-07-13 MED ORDER — ALLOPURINOL 300 MG PO TABS
300.0000 mg | ORAL_TABLET | Freq: Every day | ORAL | Status: DC
  Administered 2018-07-13 – 2018-07-16 (×4): 300 mg via ORAL
  Filled 2018-07-13 (×4): qty 1

## 2018-07-13 MED ORDER — HYDROCODONE-ACETAMINOPHEN 5-325 MG PO TABS
0.5000 | ORAL_TABLET | Freq: Three times a day (TID) | ORAL | Status: DC | PRN
  Administered 2018-07-14 – 2018-07-16 (×4): 0.5 via ORAL
  Filled 2018-07-13 (×4): qty 1

## 2018-07-13 MED ORDER — DORZOLAMIDE HCL-TIMOLOL MAL 2-0.5 % OP SOLN
1.0000 [drp] | Freq: Two times a day (BID) | OPHTHALMIC | Status: DC
  Administered 2018-07-13 – 2018-07-16 (×6): 1 [drp] via OPHTHALMIC
  Filled 2018-07-13: qty 10

## 2018-07-13 MED ORDER — ONDANSETRON HCL 4 MG/2ML IJ SOLN: 4.0000 mg | Freq: Four times a day (QID) | INTRAMUSCULAR | Status: DC | PRN

## 2018-07-13 MED ORDER — SODIUM CHLORIDE 0.9 % IV SOLN: 250.0000 mL | INTRAVENOUS | Status: DC | PRN

## 2018-07-13 MED ORDER — NEBIVOLOL HCL 10 MG PO TABS
10.0000 mg | ORAL_TABLET | Freq: Every day | ORAL | Status: DC
  Administered 2018-07-13 – 2018-07-15 (×3): 10 mg via ORAL
  Filled 2018-07-13 (×3): qty 1

## 2018-07-13 MED ORDER — BRIMONIDINE TARTRATE 0.2 % OP SOLN
1.0000 [drp] | Freq: Two times a day (BID) | OPHTHALMIC | Status: DC
  Administered 2018-07-13 – 2018-07-16 (×6): 1 [drp] via OPHTHALMIC
  Filled 2018-07-13: qty 5

## 2018-07-13 MED ORDER — SODIUM CHLORIDE (PF) 0.9 % IJ SOLN
INTRAMUSCULAR | Status: AC
  Filled 2018-07-13: qty 50

## 2018-07-13 MED ORDER — SODIUM CHLORIDE 0.9% FLUSH: 3.0000 mL | INTRAVENOUS | Status: DC | PRN

## 2018-07-13 MED ORDER — WARFARIN SODIUM 5 MG PO TABS
5.0000 mg | ORAL_TABLET | ORAL | Status: DC
  Filled 2018-07-13: qty 1

## 2018-07-13 MED ORDER — ACETAMINOPHEN 325 MG PO TABS
650.0000 mg | ORAL_TABLET | Freq: Four times a day (QID) | ORAL | Status: DC | PRN
  Administered 2018-07-14: 650 mg via ORAL
  Filled 2018-07-13: qty 2

## 2018-07-13 MED ORDER — LATANOPROST 0.005 % OP SOLN
1.0000 [drp] | Freq: Every day | OPHTHALMIC | Status: DC
  Administered 2018-07-13 – 2018-07-15 (×3): 1 [drp] via OPHTHALMIC
  Filled 2018-07-13: qty 2.5

## 2018-07-13 MED ORDER — SODIUM CHLORIDE 0.9% FLUSH
3.0000 mL | Freq: Two times a day (BID) | INTRAVENOUS | Status: DC
  Administered 2018-07-13 – 2018-07-16 (×6): 3 mL via INTRAVENOUS

## 2018-07-13 MED ORDER — FAMOTIDINE 20 MG PO TABS
20.0000 mg | ORAL_TABLET | Freq: Every day | ORAL | Status: DC
  Administered 2018-07-13 – 2018-07-16 (×4): 20 mg via ORAL
  Filled 2018-07-13 (×4): qty 1

## 2018-07-13 MED ORDER — ONDANSETRON HCL 4 MG PO TABS: 4.0000 mg | ORAL_TABLET | Freq: Four times a day (QID) | ORAL | Status: DC | PRN

## 2018-07-13 MED ORDER — FUROSEMIDE 10 MG/ML IJ SOLN
40.0000 mg | Freq: Two times a day (BID) | INTRAMUSCULAR | Status: DC
  Administered 2018-07-14 (×2): 40 mg via INTRAVENOUS
  Filled 2018-07-13 (×2): qty 4

## 2018-07-13 MED ORDER — FUROSEMIDE 10 MG/ML IJ SOLN
40.0000 mg | Freq: Once | INTRAMUSCULAR | Status: AC
  Administered 2018-07-13: 40 mg via INTRAVENOUS
  Filled 2018-07-13: qty 4

## 2018-07-13 MED ORDER — ACETAMINOPHEN 650 MG RE SUPP: 650.0000 mg | Freq: Four times a day (QID) | RECTAL | Status: DC | PRN

## 2018-07-13 MED ORDER — TRAZODONE HCL 50 MG PO TABS
25.0000 mg | ORAL_TABLET | Freq: Every day | ORAL | Status: DC
  Administered 2018-07-13 – 2018-07-15 (×3): 25 mg via ORAL
  Filled 2018-07-13 (×3): qty 1

## 2018-07-13 MED ORDER — LORATADINE 10 MG PO TABS
10.0000 mg | ORAL_TABLET | Freq: Every day | ORAL | Status: DC
  Administered 2018-07-13 – 2018-07-16 (×4): 10 mg via ORAL
  Filled 2018-07-13 (×4): qty 1

## 2018-07-13 MED ORDER — AMLODIPINE BESYLATE 5 MG PO TABS
5.0000 mg | ORAL_TABLET | Freq: Every day | ORAL | Status: DC
  Administered 2018-07-14 – 2018-07-16 (×3): 5 mg via ORAL
  Filled 2018-07-13 (×3): qty 1

## 2018-07-13 MED ORDER — IOHEXOL 350 MG/ML SOLN
100.0000 mL | Freq: Once | INTRAVENOUS | Status: AC | PRN
  Administered 2018-07-13: 100 mL via INTRAVENOUS

## 2018-07-13 NOTE — Progress Notes (Signed)
Patient declines the use of nocturnal CPAP tonight. She states that she is allergic to the plastic on masks and has previously seen a dermatologist for her breakouts. She has since been using a special foam mask tailored for her specifically to create a seal without the plastic touching her face. She is not opposed to using the mask, despite her allergy. However, she wishes to try going without it tonight. If all else fails, plan to have her home mask brought to her. RT will continue to follow.

## 2018-07-13 NOTE — ED Provider Notes (Addendum)
[facility] DEPT Provider Note   CSN: [id number] Arrival date & time: 07/13/18  1040    History   Chief Complaint Chief Complaint  Patient presents with   Shortness of Breath   Cough    HPI [name] is a 84 y.o. female with PMH/o Afib, PE (on coumadin), CHF, Fibromyalia, HTN, COPD brought in by EMS for evaluation of shortness of breath, cough, generalized weakness that is been ongoing for about a week.  Patient states that over the last week, she has just felt more fatigued and tired.  He states that she started noticing that she was having some mild shortness of breath.  She states that this would only occur when she would start to exert herself.  She states that when she would rest, shortness of breath would improve.  She denies any chest pain associated with this.  Patient states she also noted she had had some cough she states it is productive of phlegm.  No hemoptysis.  Patient states that about 2 days ago, she started noticing a low-grade fever that she states did not get above 99.9.  Patient states she also started having some chills.  Patient states that today, she was having worsening shortness of breath that was occurring at rest.  She states she checked her O2 sats at home and noted them to be 82% on room air.  She states that she tried to sit down to get some improved but states that they would not go above 85%.  She states she does not have any oxygen requirement at home.  She called her primary care doctor who advised her to come to the emergency department for further evaluation.  He does have a history of CHF and is on Lasix which she states she has been compliant with.  She states that initially, she did do some mild swelling in both her legs but states that has since improved.  She reports that normally, she sleeps on her left side or with a few pillows.  She did have to increase 1 more pillows to help with her symptoms.  Patient also reports  history of PEs and states she is currently on Coumadin.  She has been compliant with her medication.  Patient states that she has been attempting to quarantine at home and states that she has only been out to go to her doctor's appointments.  She denies any known COVID exposure or sick contacts.     The history is provided by the EMS personnel and the patient.    Past Medical History:  Diagnosis Date   Atrial fibrillation ([place])    Carotid artery occlusion    Chronic diastolic CHF (congestive heart failure) (HCC)    Hypertensive heart disease 02-12-14   Crohn's disease ([place])    Detached retina    Fibromyalgia    GERD (gastroesophageal reflux disease)    Gout    H/O hiatal hernia    High triglycerides    History of stomach ulcers 1950's   Hypertension    Long term current use of anticoagulant    Obstructive sleep apnea on CPAP    Osteoarthritis    PAIN AND OA LEF T HIP AND BOTH SHOULDERS ARE BONE ON BONE AND PAINFUL   Peripheral vascular disease (HCC)    KNOWN RIGHT INTERNAL CAROTID ARTERY OCCLUSION (NO STROKE)  --40 TO 59% STENOSIS LEFT ICA-FOLLOWED BY DR. EARLY WITH DOPPLER STUDY EVERY 6 MONTHS   Pulmonary embolism ([place]) 2011  a. Hx of PE in 02/2009 after R hip surgery, venous dopplers negative, long-term Coumadin.   PVC (premature ventricular contraction)    PT STATES HX OF PVC'S ON EKG   Recurrent upper respiratory infection (URI)    BRONCHITIS FEB 2013--SLIGHT COUGH NON-PRODUCTIVE NOW   Recurrent UTI (urinary tract infection)    "on daily medicine" (05/14/2012)   Tinnitus    Vertigo     Patient Active Problem List   Diagnosis Date Noted   Acute on chronic diastolic heart failure ([place]) 07/13/2018   Chronic atrial fibrillation 05/15/2015   Aftercare following surgery of the circulatory system, [place] 09/15/2013   Essential hypertension 06/17/2013   Atrial fibrillation with RVR ([place]) 04/01/2013   Morbid obesity ([place]) 04/01/2013   OSA on CPAP  04/01/2013   Chronic diastolic CHF (congestive heart failure) ([place])    H/o recurrent pulmonary embolism, on Coumadin 05/24/2012   Acute on chronic diastolic congestive heart failure ([place]) 05/24/2012   Normocytic anemia 05/24/2012   Thrombocytopenia ([place]) 05/24/2012   Inadequate anticoagulation 05/24/2012   Elevated brain natriuretic peptide (BNP) level 05/24/2012   Shoulder arthritis 05/15/2012   Occlusion and stenosis of carotid artery without mention of cerebral infarction 09/10/2011   OA (osteoarthritis) of hip 06/17/2011   Osteoarthritis 01/08/2011   Fibromyalgia 08/06/2010   Dyslipidemia 08/06/2010   Gout 08/06/2010   Right carotid artery occlusion 08/06/2010    Past Surgical History:  Procedure Laterality Date   APPENDECTOMY  1950's   BACK SURGERY  10/29/06   Central and foraminal decompression L3-L4, L4-L5, and L5-S1 with inspection of L4-L5 and L5-S1 disc on the right   CARDIAC CATHETERIZATION  1970's   "maybe 2" (05/14/2012)   CARDIAC CATHETERIZATION     CATARACT EXTRACTION W/ INTRAOCULAR LENS IMPLANT Right 2009   CHOLECYSTECTOMY  ~ [place] LEVEL 3  ~ 2002   DILATION AND CURETTAGE OF UTERUS     "several; from miscarriages" (05/14/2012)   EXCISION MORTON'S NEUROMA Right 05/20/00   "foot" (05/14/2012)   EYE SURGERY Left 2014   Detached retina   EYE SURGERY Left August 23, 2013   Cataract   FEMUR FRACTURE SURGERY Right 02/21/09   Open reduction internal fixation of right periprosthetic  femur fracture utilizing Zimmer cables times fiv   FRACTURE SURGERY  2011   Right Femur Fx   HAMMER TOE SURGERY Left 10/25/08   "toe next to big to" (05/14/2012)   JOINT REPLACEMENT  2009   Right Hip replacement   JOINT REPLACEMENT  2012   Right knee replacement   JOINT REPLACEMENT  06/17/11   Left Hip replacement   KNEE ARTHROSCOPY Right 07/26/05   REPLACEMENT TOTAL KNEE Right 02/19/2010   [place]?   TOTAL HIP ARTHROPLASTY Right 12/08/02   Osteonics total hip replacement   TOTAL HIP ARTHROPLASTY  06/17/2011   Procedure: TOTAL HIP ARTHROPLASTY;  Surgeon: Gearlean Alf, MD;  Location: [facility];  Service: Orthopedics;  Laterality: Left;   TOTAL SHOULDER ARTHROPLASTY Left 05/14/2012   TOTAL SHOULDER ARTHROPLASTY Left 05/14/2012   Procedure: TOTAL SHOULDER ARTHROPLASTY;  Surgeon: Marin Shutter, MD;  Location: [facility];  Service: Orthopedics;  Laterality: Left;   VAGINAL HYSTERECTOMY  1971     OB History   No obstetric history on file.      Home Medications    Prior to Admission medications   Medication Sig Start Date End Date Taking? Authorizing Provider  allopurinol (ZYLOPRIM) 300 MG tablet Take 300 mg by mouth daily. 06/09/16  Yes [provider]  aMILoride (MIDAMOR) 5 MG tablet Take 5 mg by mouth daily before breakfast. 05/18/18  Yes [provider]  amLODipine (NORVASC) 5 MG tablet Take 5 mg by mouth daily. 04/24/18  Yes [provider]  brimonidine (ALPHAGAN) 0.2 % ophthalmic solution 1 drop 2 (two) times daily.  03/20/18  Yes [provider]  celecoxib (CELEBREX) 100 MG capsule Take 100 mg by mouth daily.  02/21/18  Yes [provider]  cetirizine (ZYRTEC) 10 MG tablet Take 10 mg by mouth daily.   Yes [provider]  cimetidine (TAGAMET) 200 MG tablet Take 200 mg by mouth 2 (two) times daily.   Yes [provider]  CRANBERRY CONCENTRATE PO Take 1 tablet by mouth daily.   Yes [provider]  dorzolamide-timolol (COSOPT) 22.3-6.8 MG/ML ophthalmic solution Place 1 drop into both eyes 2 (two) times daily.  06/28/13  Yes [provider]  furosemide (LASIX) 20 MG tablet TAKE (1/2) TABLET DAILY. Patient taking differently: Take 10 mg by mouth every other day.  01/12/18  Yes [place], Peter M, MD  HYDROcodone-acetaminophen (NORCO/VICODIN) 5-325 MG tablet Take 0.5-1 tablets by mouth 3 (three) times  daily as needed for pain.  06/10/18  Yes [provider]  LUMIGAN 0.01 % SOLN Place 1 drop into both eyes at bedtime. 09/04/16  Yes [provider]  nebivolol (BYSTOLIC) 10 MG tablet Take 1 tablet (10 mg total) by mouth at bedtime. 05/19/18  Yes [place], Peter M, MD  potassium chloride (K-DUR,KLOR-CON) 10 MEQ tablet Take 10 mEq by mouth 3 (three) times a week. MWF   Yes [provider]  traZODone (DESYREL) 50 MG tablet Take 25 mg by mouth at bedtime.  08/09/16  Yes [provider]  warfarin (COUMADIN) 2.5 MG tablet TAKE 1 TO 1&1/2 TABLETS DAILY AS DIRECTED BY COUMADIN CLINIC. Patient taking differently: Take 2.5-3.75 mg by mouth as directed. TAKE 1.5 tablets (3.75 mg) on (MWF) & Take 1 tablet (2.5 mg) all other days. 06/09/18  Yes [place], Peter M, MD  ipratropium (ATROVENT) 0.06 % nasal spray Place 1 spray into both nostrils daily as needed (allergies).  04/13/18   [provider]  mupirocin ointment (BACTROBAN) 2 % Apply 1 application topically 2 (two) times daily. Patient not taking: Reported on 07/13/2018 04/22/17   Trula Slade, DPM  NONFORMULARY OR COMPOUNDED ITEM [facility]  Peripheral Neuropathy Cream- Bupivacaine 1%, Doxepin 3%, Gabapentin 6%, Pentoxifylline 3%, Topiramate 1% Apply 1-2 grams to affected area 3-4 times daily Qty. 120 gm 3 refills Patient not taking: Reported on 07/13/2018 12/16/16   Gean Birchwood, DPM  ondansetron (ZOFRAN ODT) 4 MG disintegrating tablet Take 1 tablet (4 mg total) by mouth every 8 (eight) hours as needed for nausea or vomiting. Patient not taking: Reported on 07/13/2018 09/12/16   [place], Barbette Hair, MD  silver sulfADIAZINE (SILVADENE) 1 % cream Apply 1 application topically daily. Patient not taking: Reported on 07/13/2018 12/12/15   Gean Birchwood, DPM  triamcinolone (NASACORT ALLERGY 24HR) 55 MCG/ACT AERO nasal inhaler Place 2 sprays into the nose daily as needed (allergies).     [provider]    amitriptyline (ELAVIL) 10 MG tablet Take 10 mg by mouth at bedtime.   05/07/11  [provider]    Family History Family History  Problem Relation Age of Onset   Heart disease Father  Heart Disease before age 83   Heart attack Father 21   Hypertension Father    Peripheral vascular disease Father        Right  leg amputation   Heart disease Mother        AAA   Stroke Mother    Aneurysm Mother    Hypertension Mother    AAA (abdominal aortic aneurysm) Mother    Diabetes Paternal Grandfather    Hypertension Brother    Heart disease Brother        Heart Disease before age 3   Hyperlipidemia Brother    Heart attack Brother    Hypertension Brother    Diabetes Son     Social History Social History   Tobacco Use   Smoking status: Former Smoker    Packs/day: 0.50    Years: 15.00    Pack years: 7.50    Types: Cigarettes    Last attempt to quit: 02/11/1986    Years since quitting: 32.4   Smokeless tobacco: Never Used  Substance Use Topics   Alcohol use: Yes    Alcohol/week: 0.0 standard drinks    Comment: 05/14/2012 "have 2-3 drinks/yr" maybe   Drug use: No     Allergies   Codeine; Cymbalta [duloxetine hcl]; Diltiazem cd [diltiazem hcl er beads]; Duloxetine; Metoprolol; Nortriptyline; Penicillins; Statins; Tetanus toxoid adsorbed; Tetanus toxoids; Ciprofloxacin; Lyrica [pregabalin]; and Penicillin g   Review of Systems Review of Systems  Constitutional: Positive for activity change, appetite change, chills, fatigue and fever.  Respiratory: Positive for cough and shortness of breath.   Cardiovascular: Negative for chest pain.  Gastrointestinal: Negative for abdominal pain, nausea and vomiting.  Genitourinary: Negative for dysuria and hematuria.  Neurological: Positive for weakness (Generalized). Negative for headaches.  All other systems reviewed and are negative.    Physical Exam Updated Vital Signs BP (!) 149/65 (BP Location:  Left Arm)    Pulse 81    Temp 97.8 F (36.6 C)    Resp 20    Ht 5' 2"  (1.575 m)    Wt 93.2 kg    SpO2 95%    BMI 37.58 kg/m   Physical Exam Vitals signs and nursing note reviewed.  Constitutional:      Appearance: Normal appearance. She is well-developed.  HENT:     Head: Normocephalic and atraumatic.  Eyes:     General: Lids are normal.     Conjunctiva/sclera: Conjunctivae normal.     Pupils: Pupils are equal, round, and reactive to light.  Neck:     Musculoskeletal: Full passive range of motion without pain.  Cardiovascular:     Rate and Rhythm: Regular rhythm. Tachycardia present.     Pulses: Normal pulses.          Radial pulses are 2+ on the right side and 2+ on the left side.       Dorsalis pedis pulses are 2+ on the right side and 2+ on the left side.     Heart sounds: Normal heart sounds. No murmur. No friction rub. No gallop.   Pulmonary:     Effort: Pulmonary effort is normal. Tachypnea present.     Breath sounds: Rales present.     Comments: Fine crackles noted at bases. Tachypnea. Speaking in short sentences. No wheezing.  Abdominal:     Palpations: Abdomen is soft. Abdomen is not rigid.     Tenderness: There is no abdominal tenderness. There is no guarding.  Musculoskeletal: Normal range of motion.  Comments: BLE are symmetric in appearance with no overlying warmth, erythema, or edema.   Skin:    General: Skin is warm and dry.     Capillary Refill: Capillary refill takes less than 2 seconds.  Neurological:     Mental Status: She is alert and oriented to person, place, and time.  Psychiatric:        Speech: Speech normal.      ED Treatments / Results  Labs (all labs ordered are listed, but only abnormal results are displayed) Labs Reviewed  COMPREHENSIVE METABOLIC PANEL - Abnormal; Notable for the following components:      Result Value   Potassium 3.4 (*)    Glucose, Bld 131 (*)    Calcium 8.8 (*)    Total Bilirubin 1.3 (*)    All other components  within normal limits  CBC WITH DIFFERENTIAL/PLATELET - Abnormal; Notable for the following components:   RDW 17.4 (*)    Platelets 135 (*)    Neutro Abs 7.9 (*)    Abs Immature Granulocytes 0.09 (*)    All other components within normal limits  PROTIME-INR - Abnormal; Notable for the following components:   Prothrombin Time 20.0 (*)    INR 1.7 (*)    All other components within normal limits  BRAIN NATRIURETIC PEPTIDE - Abnormal; Notable for the following components:   B Natriuretic Peptide 249.4 (*)    All other components within normal limits  PROTIME-INR - Abnormal; Notable for the following components:   Prothrombin Time 19.5 (*)    INR 1.7 (*)    All other components within normal limits  BASIC METABOLIC PANEL - Abnormal; Notable for the following components:   Potassium 2.6 (*)    Glucose, Bld 115 (*)    Calcium 8.4 (*)    All other components within normal limits  CBC - Abnormal; Notable for the following components:   Hemoglobin 11.5 (*)    RDW 17.1 (*)    Platelets 147 (*)    All other components within normal limits  MAGNESIUM - Abnormal; Notable for the following components:   Magnesium 1.5 (*)    All other components within normal limits  URINALYSIS, ROUTINE W REFLEX MICROSCOPIC - Abnormal; Notable for the following components:   Hgb urine dipstick SMALL (*)    Bacteria, UA RARE (*)    All other components within normal limits  SARS CORONAVIRUS 2 (HOSPITAL ORDER, [facility])  URINE CULTURE  LACTIC ACID, PLASMA  TROPONIN I  URINALYSIS, ROUTINE W REFLEX MICROSCOPIC    EKG EKG Interpretation  Date/Time:  Monday July 13 2018 10:54:56 EDT Ventricular Rate:  110 PR Interval:    QRS Duration: 89 QT Interval:  322 QTC Calculation: 436 R Axis:   85 Text Interpretation:  Atrial fibrillation Borderline right axis deviation Low voltage, precordial leads Nonspecific repol abnormality, diffuse leads Since last tracing PVC resolved Confirmed  by Daleen Bo [phone]) on 07/13/2018 11:34:03 AM   Radiology Ct Angio Chest Pe W And/or Wo Contrast  Result Date: 07/13/2018 CLINICAL DATA:  Short of breath.  Cough and fever for 1 week EXAM: CT ANGIOGRAPHY CHEST WITH CONTRAST TECHNIQUE: Multidetector CT imaging of the chest was performed using the standard protocol during bolus administration of intravenous contrast. Multiplanar CT image reconstructions and MIPs were obtained to evaluate the vascular anatomy. CONTRAST:  118m OMNIPAQUE IOHEXOL 350 MG/ML SOLN COMPARISON:  None. FINDINGS: Cardiovascular: No filling defects within the pulmonary arteries to suggest acute pulmonary  embolism. No acute findings of the aorta or great vessels. No pericardial fluid. Mediastinum/Nodes: No axillary supraclavicular adenopathy. No mediastinal hilar adenopathy. There are multiple small mediastinal lymph nodes which are not pathologic by size criteria. These are less than 10 mm in the paratracheal mediastinum and the prevascular space. The largest node approaches 10 mm in these substernal prevascular space. There bilateral small hilar lymph nodes additionally which are similar. Lungs/Pleura: Diffuse ground-glass opacities the upper lobes. Angular nodule in the RIGHT lower lobe measures 6 mm. Small RIGHT effusion LEFT effusion. Upper Abdomen: Limited view of the liver, kidneys, pancreas are unremarkable. Normal adrenal glands. Musculoskeletal: No aggressive osseous lesion. Review of the MIP images confirms the above findings. IMPRESSION: 1. No evidence acute pulmonary embolism. 2. Diffuse ground-glass opacities in the lungs suggest mild pulmonary edema. 3. Numerous subcentimeter mediastinal and hilar lymph nodes are favored reactive. Electronically Signed   By: Suzy Bouchard M.D.   On: 07/13/2018 16:14   Dg Chest Portable 1 View  Result Date: 07/13/2018 CLINICAL DATA:  Shortness of breath and fever. History of congestive heart failure. EXAM: PORTABLE CHEST 1 VIEW  COMPARISON:  10/08/2016 FINDINGS: Artifact overlies the chest. There is cardiomegaly. There is an interstitial pulmonary edema pattern. No sign of focal infiltrate, collapse or effusion. Previous shoulder replacement on the left. Chronic degenerative changes of the right shoulder. IMPRESSION: Cardiomegaly. Interstitial edema. Consistent with congestive heart failure. Electronically Signed   By: Nelson Chimes M.D.   On: 07/13/2018 11:43    Procedures .Critical Care Performed by: Volanda Napoleon, PA-C Authorized by: Volanda Napoleon, PA-C   Critical care provider statement:    Critical care time (minutes):  45   Critical care was necessary to treat or prevent imminent or life-threatening deterioration of the following conditions:  Respiratory failure   Critical care was time spent personally by me on the following activities:  Discussions with consultants, evaluation of patient's response to treatment, examination of patient, ordering and performing treatments and interventions, ordering and review of laboratory studies, ordering and review of radiographic studies, pulse oximetry, re-evaluation of patient's condition, obtaining history from patient or surrogate and review of old charts   (including critical care time)  Medications Ordered in ED Medications  sodium chloride (PF) 0.9 % injection (has no administration in time range)  allopurinol (ZYLOPRIM) tablet 300 mg (300 mg Oral Given 07/14/18 0938)  HYDROcodone-acetaminophen (NORCO/VICODIN) 5-325 MG per tablet 0.5-1 tablet (0.5 tablets Oral Given 07/14/18 1337)  amLODipine (NORVASC) tablet 5 mg (5 mg Oral Given 07/14/18 0938)  nebivolol (BYSTOLIC) tablet 10 mg (10 mg Oral Given 07/13/18 2142)  traZODone (DESYREL) tablet 25 mg (25 mg Oral Given 07/13/18 2143)  famotidine (PEPCID) tablet 20 mg (20 mg Oral Given 07/14/18 0938)  loratadine (CLARITIN) tablet 10 mg (10 mg Oral Given 07/14/18 0938)  brimonidine (ALPHAGAN) 0.2 % ophthalmic solution 1 drop (1  drop Both Eyes Given 07/14/18 0938)  dorzolamide-timolol (COSOPT) 22.3-6.8 MG/ML ophthalmic solution 1 drop (1 drop Both Eyes Given 07/14/18 0938)  latanoprost (XALATAN) 0.005 % ophthalmic solution 1 drop (1 drop Both Eyes Given 07/13/18 2142)  sodium chloride flush (NS) 0.9 % injection 3 mL (3 mLs Intravenous Given 07/14/18 0939)  sodium chloride flush (NS) 0.9 % injection 3 mL (has no administration in time range)  0.9 %  sodium chloride infusion (has no administration in time range)  ondansetron (ZOFRAN) tablet 4 mg (has no administration in time range)    Or  ondansetron (ZOFRAN) injection 4 mg (  has no administration in time range)  acetaminophen (TYLENOL) tablet 650 mg (650 mg Oral Given 07/14/18 0106)    Or  acetaminophen (TYLENOL) suppository 650 mg ( Rectal See Alternative 07/14/18 0106)  furosemide (LASIX) injection 40 mg (40 mg Intravenous Given 07/14/18 0937)  warfarin (COUMADIN) tablet 5 mg (has no administration in time range)  Warfarin - Pharmacist Dosing Inpatient (has no administration in time range)  potassium chloride SA (K-DUR) CR tablet 40 mEq (40 mEq Oral Given 07/14/18 1324)  magnesium sulfate IVPB 4 g 100 mL (4 g Intravenous New Bag/Given 07/14/18 1328)  furosemide (LASIX) injection 40 mg (40 mg Intravenous Given 07/13/18 1352)  amLODipine (NORVASC) tablet 5 mg (5 mg Oral Given 07/13/18 1648)  iohexol (OMNIPAQUE) 350 MG/ML injection 100 mL (100 mLs Intravenous Contrast Given 07/13/18 1551)  loperamide (IMODIUM) capsule 4 mg (4 mg Oral Given 07/14/18 1324)     Initial Impression / Assessment and Plan / ED Course  I have reviewed the triage vital signs and the nursing notes.  Pertinent labs & imaging results that were available during my care of the patient were reviewed by me and considered in my medical decision making (see chart for details).        84 year old female possible history of CHF, A. fib, PE (currently on Coumadin) who presents for evaluation of cough, shortness of breath,  generalized weakness, fever that has been ongoing for last week.  She states symptoms worsened last 2 days.  Today, O2 SATs dropped to 82% on home.  On EMS arrival, patient was fluctuating between 87-88% on room air.  They placed patient on 2 L nasal cannula which bumped her back up to 92.  Patient with no known COVID exposures.  Consider infectious etiology versus CHF exacerbation.  Plan for labs, chest x-ray.  Troponin negative.  CMP shows normal BUN and creatinine.  Potassium of 3.4.  Lactic acid is 1.4.  INR is 1.7.  CBC shows no leukocytosis, anemia.  COVID negative.  Chest x-ray shows cardiomegaly with interstitial edema consistent with CHF.  Given concern for fluid overload, will plan for dose of Lasix here in ED. Patient is on 10 mg qday. She states they have been trying to adjust her Lasix to deal with side effects.  She was recently started on Bystolic.  Attempted to ambulate patient from the bed to bedside commode.  Patient's had drop in O2 sats down to about 86-87% on 2 L O2 and was tacky into the 120s.  Her INR is subtherapeutic.  She does have history of PEs.  Given hypoxia, tachycardia, will plan for CTA for evaluation of PE.  Re-evaluation.  Patient's heart rate has improved in the 100s.  Plan to give her dose of blood pressure medication.  Given shortness of breath with concerns of CHF exacerbation, will plan for admission.  Discussed patient with Dr. Lonny Prude (Hospitalist). Will plan for admission pending CTA of chest.   Portions of this note were generated with Dragon dictation software. Dictation errors may occur despite best attempts at proofreading.  Final Clinical Impressions(s) / ED Diagnoses   Final diagnoses:  Shortness of breath  Acute on chronic congestive heart failure, unspecified heart failure type [facility])    ED Discharge Orders    None       Volanda Napoleon, PA-C 07/14/18 1413    Volanda Napoleon, PA-C 07/14/18 1414    Daleen Bo, MD 07/14/18  1744

## 2018-07-13 NOTE — H&P (Signed)
History and Physical    [name] JHE:[id number] DOB: 12-18-1934 DOA: 07/13/2018  PCP: Deland Pretty, MD Patient coming from: Home  Chief Complaint: Shortness of breath  HPI: [name] is a 84 y.o. female with medical history significant of atrial fibrillation, chronic diastolic heart failure, GERD, OSA. Patient reports a week history of worsening shortness of breath that has not improved with diuretic use; she also reports fever at home with a Tmax of 100 degrees farenheit but she states EMS measured a temperature of 101.5 degrees farenheit. No chest pain. She appears to be below her last weight of 220 lbs from two months ago currently at 210 lbs.   ED Course: Vitals: Afebrile, pulse of 90-100s, RR of 18-20, SpO2 of 98% on 2L via nasal canula Labs: Potassium of 3.4, BNP of 249, platelets of 135 Imaging: Chest x-ray and CT scan suggest CHF. No acute PE Medications/Course: IV lasix  Review of Systems: Review of Systems  Constitutional: Positive for fever and malaise/fatigue.  Respiratory: Positive for shortness of breath.   Cardiovascular: Negative for chest pain, orthopnea and PND.  Gastrointestinal: Positive for diarrhea (chronic issue especially with lasix use). Negative for nausea and vomiting.  All other systems reviewed and are negative.   Past Medical History:  Diagnosis Date  . Atrial fibrillation ([place])   . Carotid artery occlusion   . Chronic diastolic CHF (congestive heart failure) (HCC)    Hypertensive heart disease 02-12-14  . Crohn's disease ([place])   . Detached retina   . Fibromyalgia   . GERD (gastroesophageal reflux disease)   . Gout   . H/O hiatal hernia   . High triglycerides   . History of stomach ulcers 1950's  . Hypertension   . Long term current use of anticoagulant   . Obstructive sleep apnea on CPAP   . Osteoarthritis    PAIN AND OA LEF T HIP AND BOTH SHOULDERS ARE BONE ON BONE AND PAINFUL  . Peripheral vascular disease (HCC)    KNOWN RIGHT  INTERNAL CAROTID ARTERY OCCLUSION (NO STROKE)  --40 TO 59% STENOSIS LEFT ICA-FOLLOWED BY DR. EARLY WITH DOPPLER STUDY EVERY 6 MONTHS  . Pulmonary embolism ([place]) 2011   a. Hx of PE in 02/2009 after R hip surgery, venous dopplers negative, long-term Coumadin.  [name] Kitchen PVC (premature ventricular contraction)    PT STATES HX OF PVC'S ON EKG  . Recurrent upper respiratory infection (URI)    BRONCHITIS FEB 2013--SLIGHT COUGH NON-PRODUCTIVE NOW  . Recurrent UTI (urinary tract infection)    "on daily medicine" (05/14/2012)  . Tinnitus   . Vertigo     Past Surgical History:  Procedure Laterality Date  . APPENDECTOMY  1950's  . BACK SURGERY  10/29/06   Central and foraminal decompression L3-L4, L4-L5, and L5-S1 with inspection of L4-L5 and L5-S1 disc on the right  . CARDIAC CATHETERIZATION  1970's   "maybe 2" (05/14/2012)  . CARDIAC CATHETERIZATION    . CATARACT EXTRACTION W/ INTRAOCULAR LENS IMPLANT Right 2009  . CHOLECYSTECTOMY  ~ 1988  . DECOMPRESSIVE LUMBAR LAMINECTOMY LEVEL 3  ~ 2002  . DILATION AND CURETTAGE OF UTERUS     "several; from miscarriages" (05/14/2012)  . EXCISION MORTON'S NEUROMA Right 05/20/00   "foot" (05/14/2012)  . EYE SURGERY Left 2014   Detached retina  . EYE SURGERY Left August 23, 2013   Cataract  . FEMUR FRACTURE SURGERY Right 02/21/09   Open reduction internal fixation of right periprosthetic  femur fracture utilizing Zimmer cables  times fiv  . FRACTURE SURGERY  2011   Right Femur Fx  . HAMMER TOE SURGERY Left 10/25/08   "toe next to big to" (05/14/2012)  . JOINT REPLACEMENT  2009   Right Hip replacement  . JOINT REPLACEMENT  2012   Right knee replacement  . JOINT REPLACEMENT  06/17/11   Left Hip replacement  . KNEE ARTHROSCOPY Right 07/26/05  . REPLACEMENT TOTAL KNEE Right 02/19/2010  . SPINE SURGERY    . [place]?  . TOTAL HIP ARTHROPLASTY Right 12/08/02   Osteonics total hip replacement  . TOTAL HIP ARTHROPLASTY  06/17/2011   Procedure: TOTAL HIP  ARTHROPLASTY;  Surgeon: Gearlean Alf, MD;  Location: [facility];  Service: Orthopedics;  Laterality: Left;  . TOTAL SHOULDER ARTHROPLASTY Left 05/14/2012  . TOTAL SHOULDER ARTHROPLASTY Left 05/14/2012   Procedure: TOTAL SHOULDER ARTHROPLASTY;  Surgeon: Marin Shutter, MD;  Location: [facility];  Service: Orthopedics;  Laterality: Left;  [name] Kitchen VAGINAL HYSTERECTOMY  1971     reports that she quit smoking about 32 years ago. Her smoking use included cigarettes. She has a 7.50 pack-year smoking history. She has never used smokeless tobacco. She reports current alcohol use. She reports that she does not use drugs.  Allergies  Allergen Reactions  . Codeine     NAUSEA  . Cymbalta [Duloxetine Hcl]     Nausea VOMITING AND ABDOMINAL PAIN, HEADACHE, JUST ABOUT EVERY SIDE EFFECT THE DRUG HAS  . Diltiazem Cd [Diltiazem Hcl Er Beads]     Heavy legs, cough  . Duloxetine     Other reaction(s): Other (See Comments) Nausea VOMITING AND ABDOMINAL PAIN, HEADACHE, JUST ABOUT EVERY SIDE EFFECT THE DRUG HAS  . Metoprolol     Legs like cement  . Nortriptyline     Nightmares  . Penicillins     RASH  & ITCHING   --PT STATES SHE CAN TAKE KEFLEX PO AND IV CEPHALOSPORINS  . Statins     Leg cramps  . Tetanus Toxoid Adsorbed Swelling  . Tetanus Toxoids     SWELLING, REDNESS  WHOLE ARM  . Ciprofloxacin Rash    RASH  . Lyrica [Pregabalin] Diarrhea, Nausea And Vomiting and Rash  . Penicillin G Rash    Family History  Problem Relation Age of Onset  . Heart disease Father        Heart Disease before age 94  . Heart attack Father 35  . Hypertension Father   . Peripheral vascular disease Father        Right  leg amputation  . Heart disease Mother        AAA  . Stroke Mother   . Aneurysm Mother   . Hypertension Mother   . AAA (abdominal aortic aneurysm) Mother   . Diabetes Paternal Grandfather   . Hypertension Brother   . Heart disease Brother        Heart Disease before age 29  . Hyperlipidemia Brother   . Heart  attack Brother   . Hypertension Brother   . Diabetes Son     Prior to Admission medications   Medication Sig Start Date End Date Taking? Authorizing Provider  allopurinol (ZYLOPRIM) 300 MG tablet Take 300 mg by mouth daily. 06/09/16   [provider]  aMILoride (MIDAMOR) 5 MG tablet Take 5 mg by mouth daily before breakfast. 05/18/18   [provider]  amLODipine (NORVASC) 2.5 MG tablet Take 2.5 mg by mouth daily.    [provider]  amLODipine (NORVASC) 5 MG tablet Take 5 mg by mouth daily. 04/24/18   [provider]  brimonidine (ALPHAGAN) 0.2 % ophthalmic solution 1 drop 2 (two) times daily.  03/20/18   [provider]  celecoxib (CELEBREX) 100 MG capsule  02/21/18   [provider]  celecoxib (CELEBREX) 100 MG capsule Take 100 mg by mouth 2 (two) times a day. 02/21/18   [provider]  cetirizine (ZYRTEC) 10 MG tablet Take 10 mg by mouth daily.    [provider]  clobetasol cream (TEMOVATE) 4.62 % Apply 1 application topically 2 (two) times daily. 10/03/14   [provider]  CRANBERRY CONCENTRATE PO Take 1 tablet by mouth daily.    [provider]  dorzolamide-timolol (COSOPT) 22.3-6.8 MG/ML ophthalmic solution Place 1 drop into both eyes 2 (two) times daily.  06/28/13   [provider]  furosemide (LASIX) 20 MG tablet TAKE (1/2) TABLET DAILY. 01/12/18   Martinique, Peter M, MD  HYDROcodone-acetaminophen (NORCO/VICODIN) 5-325 MG tablet Take 1 tablet by mouth every 6 (six) hours as needed for moderate pain.    [provider]  HYDROcodone-acetaminophen (NORCO/VICODIN) 5-325 MG tablet Take 1 tablet by mouth 3 (three) times daily as needed for pain. 06/10/18   [provider]  hydrocortisone 2.5 % ointment  12/03/17   [provider]  indapamide (LOZOL) 2.5 MG tablet Take 2.5 mg by mouth daily.    [provider]  indapamide (LOZOL) 2.5 MG tablet Take 2.5 mg by mouth 2  (two) times a day. 02/07/18   [provider]  INVOKANA 300 MG TABS tablet Place 300 mg into the right eye daily. 05/13/18   [provider]  ipratropium (ATROVENT) 0.06 % nasal spray Place 1 spray into both nostrils as needed. 04/13/18   [provider]  ketoconazole (NIZORAL) 2 % cream Apply 1 application topically 2 (two) times daily as needed for irritation.  09/26/11   [provider]  LUMIGAN 0.01 % SOLN Place 1 drop into both eyes at bedtime. 09/04/16   [provider]  mupirocin ointment (BACTROBAN) 2 % Apply 1 application topically 2 (two) times daily. 04/22/17   Trula Slade, DPM  nebivolol (BYSTOLIC) 10 MG tablet Take 1 tablet (10 mg total) by mouth at bedtime. 05/19/18   Martinique, Peter M, MD  NONFORMULARY OR COMPOUNDED ITEM [facility]  Peripheral Neuropathy Cream- Bupivacaine 1%, Doxepin 3%, Gabapentin 6%, Pentoxifylline 3%, Topiramate 1% Apply 1-2 grams to affected area 3-4 times daily Qty. 120 gm 3 refills 12/16/16   Gean Birchwood, DPM  omeprazole (PRILOSEC) 40 MG capsule Take 40 mg by mouth daily.    [provider]  ondansetron (ZOFRAN ODT) 4 MG disintegrating tablet Take 1 tablet (4 mg total) by mouth every 8 (eight) hours as needed for nausea or vomiting. 09/12/16   Horton, Barbette Hair, MD  potassium chloride (K-DUR,KLOR-CON) 10 MEQ tablet Take 10 mEq by mouth 2 (two) times daily.    [provider]  RHOPRESSA 0.02 % SOLN Place 1 drop into the right eye at bedtime. 04/10/18   [provider]  silver sulfADIAZINE (SILVADENE) 1 % cream Apply 1 application topically daily. 12/12/15   Tuchman, Richard C, DPM  traZODone (DESYREL) 50 MG tablet Take 50 mg by mouth at bedtime. 08/09/16   [provider]  triamcinolone (NASACORT ALLERGY 24HR) 55 MCG/ACT AERO nasal inhaler Place 2 sprays into the nose daily as needed.    [provider]  warfarin (COUMADIN) 2.5 MG tablet TAKE 1 TO 1&1/2 TABLETS  DAILY AS DIRECTED BY COUMADIN CLINIC. 06/09/18   Martinique, Peter M, MD  amitriptyline (ELAVIL) 10 MG tablet Take 10 mg by mouth at bedtime.   05/07/11  [provider]    Physical Exam:  Physical Exam Vitals signs reviewed.  Constitutional:      General: She is not in acute distress.    Appearance: She is well-developed. She is not diaphoretic.  Eyes:     Conjunctiva/sclera: Conjunctivae normal.     Pupils: Pupils are equal, round, and reactive to light.  Neck:     Musculoskeletal: Normal range of motion.  Cardiovascular:     Rate and Rhythm: Normal rate and regular rhythm.     Heart sounds: Normal heart sounds. No murmur.  Pulmonary:     Effort: Pulmonary effort is normal. No respiratory distress.     Breath sounds: Decreased breath sounds present. No wheezing.  Abdominal:     General: Bowel sounds are normal. There is no distension.     Palpations: Abdomen is soft.     Tenderness: There is no abdominal tenderness. There is no guarding or rebound.  Musculoskeletal: Normal range of motion.        General: No tenderness.     Right lower leg: No edema.     Left lower leg: No edema.  Lymphadenopathy:     Cervical: No cervical adenopathy.  Skin:    General: Skin is warm and dry.  Neurological:     Mental Status: She is alert and oriented to person, place, and time.     Labs on Admission: I have personally reviewed following labs and imaging studies  CBC: Recent Labs  Lab 07/13/18 1206  WBC 9.7  NEUTROABS 7.9*  HGB 12.5  HCT 39.0  MCV 89.9  PLT 135*    Basic Metabolic Panel: Recent Labs  Lab 07/13/18 1206  NA 141  K 3.4*  CL 104  CO2 25  GLUCOSE 131*  BUN 11  CREATININE 0.61  CALCIUM 8.8*    GFR: Estimated Creatinine Clearance: 59.4 mL/min (by C-G formula based on SCr of 0.61 mg/dL).  Liver Function Tests: Recent Labs  Lab 07/13/18 1206  AST 23  ALT 16  ALKPHOS 96  BILITOT 1.3*  PROT 7.5  ALBUMIN 3.7   No results for input(s): LIPASE,  AMYLASE in the last 168 hours. No results for input(s): AMMONIA in the last 168 hours.  Coagulation Profile: Recent Labs  Lab 07/10/18 1107 07/13/18 1206  INR 2.0 1.7*    Cardiac Enzymes: Recent Labs  Lab 07/13/18 1206  TROPONINI <0.03    BNP (last 3 results) No results for input(s): PROBNP in the last 8760 hours.  HbA1C: No results for input(s): HGBA1C in the last 72 hours.  CBG: No results for input(s): GLUCAP in the last 168 hours.  Lipid Profile: No results for input(s): CHOL, HDL, LDLCALC, TRIG, CHOLHDL, LDLDIRECT in the last 72 hours.  Thyroid Function Tests: No results for input(s): TSH, T4TOTAL, FREET4, T3FREE, THYROIDAB in the last 72 hours.  Anemia Panel: No results for input(s): VITAMINB12, FOLATE, FERRITIN, TIBC, IRON, RETICCTPCT in the last 72 hours.  Urine analysis:    Component Value Date/Time   COLORURINE AMBER (A) 09/11/2016 0355   APPEARANCEUR CLEAR 09/11/2016 0355   LABSPEC 1.025 09/11/2016 0355   PHURINE 5.0 09/11/2016 [place] 09/11/2016 0355   HGBUR NEGATIVE 09/11/2016 [place] 09/11/2016 0355  KETONESUR NEGATIVE 09/11/2016 0355   PROTEINUR 100 (A) 09/11/2016 0355   UROBILINOGEN 0.2 04/02/2013 0554   NITRITE NEGATIVE 09/11/2016 0355   LEUKOCYTESUR NEGATIVE 09/11/2016 0355     Radiological Exams on Admission: Dg Chest Portable 1 View  Result Date: 07/13/2018 CLINICAL DATA:  Shortness of breath and fever. History of congestive heart failure. EXAM: PORTABLE CHEST 1 VIEW COMPARISON:  10/08/2016 FINDINGS: Artifact overlies the chest. There is cardiomegaly. There is an interstitial pulmonary edema pattern. No sign of focal infiltrate, collapse or effusion. Previous shoulder replacement on the left. Chronic degenerative changes of the right shoulder. IMPRESSION: Cardiomegaly. Interstitial edema. Consistent with congestive heart failure. Electronically Signed   By: Nelson Chimes M.D.   On: 07/13/2018 11:43     EKG: Independently reviewed. Atrial fibrillation with inferolateral t-wave changes which appear to be chronic  Assessment/Plan Active Problems:   Acute on chronic diastolic heart failure (HCC)   Acute on chronic diastolic heart failure Patient has been weaned down on her Lasix and increased on amiloride, for which she has stopped using. -Lasix 40 mg IV BID -Transthoracic Echocardiogram  -Daily BMP, daily weights and strict in/out  Acute respiratory failure with hypoxia Secondary to CHF. Currently on 2L via [place] -Wean to room air with treatment of above  Fever No mention of fever in triage notes. WBC normal. COVID-19 negative. -Monitor  Diabetes mellitus, type 2 Patient is on Invokana as an outpatient. -Continue Invokana and start SSI  Chronic atrial fibrillation Currently on chronic anticoagulation and rate control medication -Continue nebivolol -Continue Coumadin per pharmacy  Essential hypertension -Continue amlodipine, nebivolol  Gout -Continue allopurinol  GERD -Continue PPI (Protonix inpatient)  Thrombocytopenia Chronic and mild. Stable.  DVT prophylaxis: Coumadin Code Status: Full code Family Communication: None Disposition Plan: Telemetry Consults called: None Admission status: Inpatient for IV lasix and diuresis   Cordelia Poche, MD [facility] 07/13/2018, 2:45 PM

## 2018-07-13 NOTE — ED Provider Notes (Signed)
  Face-to-face evaluation   History: She presents for evaluation of chills, with rising temperature over the last week.  She denies nausea, vomiting, weakness or dizziness.  She did not take her medications this morning.  There are no other new modifying factors.  Physical exam: Alert, elderly patient.  Heart regular rate and rhythm without murmur lungs clear anteriorly.  Soft and nontender to palpation.  Extremities without edema.  Medical screening examination/treatment/procedure(s) were conducted as a shared visit with non-physician practitioner(s) and myself.  I personally evaluated the patient during the encounter    Daleen Bo, MD 07/14/18 1745

## 2018-07-13 NOTE — ED Notes (Signed)
ED TO INPATIENT HANDOFF REPORT  ED Nurse Name and Phone #: Tana Coast Name/Age/Gender [name] 84 y.o. female Room/Bed: WA09/WA09  Code Status   Code Status: Prior  Home/SNF/Other Home Patient oriented to: self, place, time and situation Is this baseline? Yes   Triage Complete: Triage complete  Chief Complaint ShOB  Triage Note EMS reports from home, c/o SOB, cough and fever x 1 week. Hx of COPD, CHF.  BP 200/100 HR 118 RR 20 Sp02 91 RA     Allergies Allergies  Allergen Reactions  . Codeine     NAUSEA  . Cymbalta [Duloxetine Hcl]     Nausea VOMITING AND ABDOMINAL PAIN, HEADACHE, JUST ABOUT EVERY SIDE EFFECT THE DRUG HAS  . Diltiazem Cd [Diltiazem Hcl Er Beads]     Heavy legs, cough  . Duloxetine     Other reaction(s): Other (See Comments) Nausea VOMITING AND ABDOMINAL PAIN, HEADACHE, JUST ABOUT EVERY SIDE EFFECT THE DRUG HAS  . Metoprolol     Legs like cement  . Nortriptyline     Nightmares  . Penicillins     RASH  & ITCHING   --PT STATES SHE CAN TAKE KEFLEX PO AND IV CEPHALOSPORINS  . Statins     Leg cramps  . Tetanus Toxoid Adsorbed Swelling  . Tetanus Toxoids     SWELLING, REDNESS  WHOLE ARM  . Ciprofloxacin Rash    RASH  . Lyrica [Pregabalin] Diarrhea, Nausea And Vomiting and Rash  . Penicillin G Rash    Level of Care/Admitting Diagnosis ED Disposition    ED Disposition Condition Comment   Admit  Hospital Area: [facility] [[id number]]  Level of Care: Telemetry [5]  Admit to tele based on following criteria: Acute CHF  Covid Evaluation: Confirmed COVID Negative  Diagnosis: Acute on chronic diastolic heart failure ([place]) [428.33.ICD-9-CM]  Admitting Physician: Mariel Aloe [[id number]]  Attending Physician: Mariel Aloe [phone]  PT Class (Do Not Modify): Observation [104]  PT Acc Code (Do Not Modify): Observation [10022]       B Medical/Surgery History Past Medical History:  Diagnosis Date  . Atrial fibrillation ([place])    . Carotid artery occlusion   . Chronic diastolic CHF (congestive heart failure) (HCC)    Hypertensive heart disease 02-12-14  . Crohn's disease ([place])   . Detached retina   . Fibromyalgia   . GERD (gastroesophageal reflux disease)   . Gout   . H/O hiatal hernia   . High triglycerides   . History of stomach ulcers 1950's  . Hypertension   . Long term current use of anticoagulant   . Obstructive sleep apnea on CPAP   . Osteoarthritis    PAIN AND OA LEF T HIP AND BOTH SHOULDERS ARE BONE ON BONE AND PAINFUL  . Peripheral vascular disease (HCC)    KNOWN RIGHT INTERNAL CAROTID ARTERY OCCLUSION (NO STROKE)  --40 TO 59% STENOSIS LEFT ICA-FOLLOWED BY DR. EARLY WITH DOPPLER STUDY EVERY 6 MONTHS  . Pulmonary embolism ([place]) 2011   a. Hx of PE in 02/2009 after R hip surgery, venous dopplers negative, long-term Coumadin.  [name] Kitchen PVC (premature ventricular contraction)    PT STATES HX OF PVC'S ON EKG  . Recurrent upper respiratory infection (URI)    BRONCHITIS FEB 2013--SLIGHT COUGH NON-PRODUCTIVE NOW  . Recurrent UTI (urinary tract infection)    "on daily medicine" (05/14/2012)  . Tinnitus   . Vertigo    Past Surgical History:  Procedure Laterality Date  .  APPENDECTOMY  1950's  . BACK SURGERY  10/29/06   Central and foraminal decompression L3-L4, L4-L5, and L5-S1 with inspection of L4-L5 and L5-S1 disc on the right  . CARDIAC CATHETERIZATION  1970's   "maybe 2" (05/14/2012)  . CARDIAC CATHETERIZATION    . CATARACT EXTRACTION W/ INTRAOCULAR LENS IMPLANT Right 2009  . CHOLECYSTECTOMY  ~ 1988  . DECOMPRESSIVE LUMBAR LAMINECTOMY LEVEL 3  ~ 2002  . DILATION AND CURETTAGE OF UTERUS     "several; from miscarriages" (05/14/2012)  . EXCISION MORTON'S NEUROMA Right 05/20/00   "foot" (05/14/2012)  . EYE SURGERY Left 2014   Detached retina  . EYE SURGERY Left August 23, 2013   Cataract  . FEMUR FRACTURE SURGERY Right 02/21/09   Open reduction internal fixation of right periprosthetic  femur fracture utilizing  Zimmer cables times fiv  . FRACTURE SURGERY  2011   Right Femur Fx  . HAMMER TOE SURGERY Left 10/25/08   "toe next to big to" (05/14/2012)  . JOINT REPLACEMENT  2009   Right Hip replacement  . JOINT REPLACEMENT  2012   Right knee replacement  . JOINT REPLACEMENT  06/17/11   Left Hip replacement  . KNEE ARTHROSCOPY Right 07/26/05  . REPLACEMENT TOTAL KNEE Right 02/19/2010  . SPINE SURGERY    . [place]?  . TOTAL HIP ARTHROPLASTY Right 12/08/02   Osteonics total hip replacement  . TOTAL HIP ARTHROPLASTY  06/17/2011   Procedure: TOTAL HIP ARTHROPLASTY;  Surgeon: Gearlean Alf, MD;  Location: [facility];  Service: Orthopedics;  Laterality: Left;  . TOTAL SHOULDER ARTHROPLASTY Left 05/14/2012  . TOTAL SHOULDER ARTHROPLASTY Left 05/14/2012   Procedure: TOTAL SHOULDER ARTHROPLASTY;  Surgeon: Marin Shutter, MD;  Location: [place];  Service: Orthopedics;  Laterality: Left;  [name] Kitchen VAGINAL HYSTERECTOMY  1971     A IV Location/Drains/Wounds Patient Lines/Drains/Airways Status   Active Line/Drains/Airways    Name:   Placement date:   Placement time:   Site:   Days:   Peripheral IV 07/13/18 Right Forearm   07/13/18    1209    Forearm   less than 1          Intake/Output Last 24 hours No intake or output data in the 24 hours ending 07/13/18 1838  Labs/Imaging Results for orders placed or performed during the hospital encounter of 07/13/18 (from the past 30 hour(s))  SARS Coronavirus 2 (CEPHEID- Performed in [place] hospital lab), Hosp Order     Status: None   Collection Time: 07/13/18 11:06 AM  Result Value Ref Range   SARS Coronavirus 2 NEGATIVE NEGATIVE    Comment: (NOTE) If result is NEGATIVE SARS-CoV-2 target nucleic acids are NOT DETECTED. The SARS-CoV-2 RNA is generally detectable in upper and lower  respiratory specimens during the acute phase of infection. The lowest  concentration of SARS-CoV-2 viral copies this assay can detect is 250  copies / mL. A negative  result does not preclude SARS-CoV-2 infection  and should not be used as the sole basis for treatment or other  patient management decisions.  A negative result may occur with  improper specimen collection / handling, submission of specimen other  than nasopharyngeal swab, presence of viral mutation(s) within the  areas targeted by this assay, and inadequate number of viral copies  (<250 copies / mL). A negative result must be combined with clinical  observations, patient history, and epidemiological information. If result is POSITIVE SARS-CoV-2 target nucleic acids are DETECTED. The  SARS-CoV-2 RNA is generally detectable in upper and lower  respiratory specimens dur ing the acute phase of infection.  Positive  results are indicative of active infection with SARS-CoV-2.  Clinical  correlation with patient history and other diagnostic information is  necessary to determine patient infection status.  Positive results do  not rule out bacterial infection or co-infection with other viruses. If result is PRESUMPTIVE POSTIVE SARS-CoV-2 nucleic acids MAY BE PRESENT.   A presumptive positive result was obtained on the submitted specimen  and confirmed on repeat testing.  While 2019 novel coronavirus  (SARS-CoV-2) nucleic acids may be present in the submitted sample  additional confirmatory testing may be necessary for epidemiological  and / or clinical management purposes  to differentiate between  SARS-CoV-2 and other Sarbecovirus currently known to infect humans.  If clinically indicated additional testing with an alternate test  methodology [phone]) is advised. The SARS-CoV-2 RNA is generally  detectable in upper and lower respiratory sp ecimens during the acute  phase of infection. The expected result is Negative. Fact Sheet for Patients:  StrictlyIdeas.no Fact Sheet for Healthcare Providers: BankingDealers.co.za This test is not yet  approved or cleared by the [place] FDA and has been authorized for detection and/or diagnosis of SARS-CoV-2 by FDA under an Emergency Use Authorization (EUA).  This EUA will remain in effect (meaning this test can be used) for the duration of the COVID-19 declaration under Section 564(b)(1) of the Act, 21 U.S.C. section 360bbb-3(b)(1), unless the authorization is terminated or revoked sooner. Performed at [facility], [place] [address], [place], [place] [address]   Comprehensive metabolic panel     Status: Abnormal   Collection Time: 07/13/18 12:06 PM  Result Value Ref Range   Sodium 141 135 - 145 mmol/L   Potassium 3.4 (L) 3.5 - 5.1 mmol/L   Chloride 104 98 - 111 mmol/L   CO2 25 22 - 32 mmol/L   Glucose, Bld 131 (H) 70 - 99 mg/dL   BUN 11 8 - 23 mg/dL   Creatinine, Ser 0.61 0.44 - 1.00 mg/dL   Calcium 8.8 (L) 8.9 - 10.3 mg/dL   Total Protein 7.5 6.5 - 8.1 g/dL   Albumin 3.7 3.5 - 5.0 g/dL   AST 23 15 - 41 U/L   ALT 16 0 - 44 U/L   Alkaline Phosphatase 96 38 - 126 U/L   Total Bilirubin 1.3 (H) 0.3 - 1.2 mg/dL   GFR calc non Af Amer >60 >60 mL/min   GFR calc Af Amer >60 >60 mL/min   Anion gap 12 5 - 15    Comment: Performed at [facility], [place] [address], [place], [place] [address]  CBC with Differential     Status: Abnormal   Collection Time: 07/13/18 12:06 PM  Result Value Ref Range   WBC 9.7 4.0 - 10.5 K/uL   RBC 4.34 3.87 - 5.11 MIL/uL   Hemoglobin 12.5 12.0 - 15.0 g/dL   HCT 39.0 36.0 - 46.0 %   MCV 89.9 80.0 - 100.0 fL   MCH 28.8 26.0 - 34.0 pg   MCHC 32.1 30.0 - 36.0 g/dL   RDW 17.4 (H) 11.5 - 15.5 %   Platelets 135 (L) 150 - 400 K/uL   nRBC 0.0 0.0 - 0.2 %   Neutrophils Relative % 82 %   Neutro Abs 7.9 (H) 1.7 - 7.7 K/uL   Lymphocytes Relative 8 %   Lymphs Abs 0.8 0.7 - 4.0 K/uL  Monocytes Relative 7 %   Monocytes Absolute 0.7 0.1 - 1.0 K/uL   Eosinophils Relative 2 %   Eosinophils Absolute 0.2 0.0 - 0.5 K/uL    Basophils Relative 0 %   Basophils Absolute 0.0 0.0 - 0.1 K/uL   Immature Granulocytes 1 %   Abs Immature Granulocytes 0.09 (H) 0.00 - 0.07 K/uL    Comment: Performed at [facility], [place] [address] [place], [place] [address]  Protime-INR     Status: Abnormal   Collection Time: 07/13/18 12:06 PM  Result Value Ref Range   Prothrombin Time 20.0 (H) 11.4 - 15.2 seconds   INR 1.7 (H) 0.8 - 1.2    Comment: (NOTE) INR goal varies based on device and disease states. Performed at [facility], [place] [address] [place], [place] [address]   Lactic acid, plasma     Status: None   Collection Time: 07/13/18 12:06 PM  Result Value Ref Range   Lactic Acid, Venous 1.4 0.5 - 1.9 mmol/L    Comment: Performed at [facility], [place] [address] [place], [place] [address]  Brain natriuretic peptide     Status: Abnormal   Collection Time: 07/13/18 12:06 PM  Result Value Ref Range   B Natriuretic Peptide 249.4 (H) 0.0 - 100.0 pg/mL    Comment: Performed at [facility], [place] [address] [place], [place] [address]  Troponin I - ONCE - STAT     Status: None   Collection Time: 07/13/18 12:06 PM  Result Value Ref Range   Troponin I <0.03 <0.03 ng/mL    Comment: Performed at [facility], [place] [address], [place], [place] [address]   Ct Angio Chest Pe W And/or Wo Contrast  Result Date: 07/13/2018 CLINICAL DATA:  Short of breath.  Cough and fever for 1 week EXAM: CT ANGIOGRAPHY CHEST WITH CONTRAST TECHNIQUE: Multidetector CT imaging of the chest was performed using the standard protocol during bolus administration of intravenous contrast. Multiplanar CT image reconstructions and MIPs were obtained to evaluate the vascular anatomy. CONTRAST:  142m OMNIPAQUE IOHEXOL 350 MG/ML SOLN COMPARISON:  None. FINDINGS: Cardiovascular: No filling defects within the pulmonary arteries to suggest acute pulmonary embolism. No acute  findings of the aorta or great vessels. No pericardial fluid. Mediastinum/Nodes: No axillary supraclavicular adenopathy. No mediastinal hilar adenopathy. There are multiple small mediastinal lymph nodes which are not pathologic by size criteria. These are less than 10 mm in the paratracheal mediastinum and the prevascular space. The largest node approaches 10 mm in these substernal prevascular space. There bilateral small hilar lymph nodes additionally which are similar. Lungs/Pleura: Diffuse ground-glass opacities the upper lobes. Angular nodule in the RIGHT lower lobe measures 6 mm. Small RIGHT effusion LEFT effusion. Upper Abdomen: Limited view of the liver, kidneys, pancreas are unremarkable. Normal adrenal glands. Musculoskeletal: No aggressive osseous lesion. Review of the MIP images confirms the above findings. IMPRESSION: 1. No evidence acute pulmonary embolism. 2. Diffuse ground-glass opacities in the lungs suggest mild pulmonary edema. 3. Numerous subcentimeter mediastinal and hilar lymph nodes are favored reactive. Electronically Signed   By: SSuzy BouchardM.D.   On: 07/13/2018 16:14   Dg Chest Portable 1 View  Result Date: 07/13/2018 CLINICAL DATA:  Shortness of breath and fever. History of congestive heart failure. EXAM: PORTABLE CHEST 1 VIEW COMPARISON:  10/08/2016 FINDINGS: Artifact overlies the chest. There is cardiomegaly. There is an interstitial pulmonary edema pattern. No sign of focal infiltrate, collapse or  effusion. Previous shoulder replacement on the left. Chronic degenerative changes of the right shoulder. IMPRESSION: Cardiomegaly. Interstitial edema. Consistent with congestive heart failure. Electronically Signed   By: Nelson Chimes M.D.   On: 07/13/2018 11:43    Pending Labs Unresulted Labs (From admission, onward)    Start     Ordered   07/14/18 0500  Protime-INR  Daily,   R     07/13/18 1818   07/13/18 1105  Urinalysis, Routine w reflex microscopic  ONCE - STAT,   STAT      07/13/18 1106   Signed and Held  Basic metabolic panel  Daily,   R     Signed and Held   Signed and Held  CBC  Tomorrow morning,   R     Signed and Held          Vitals/Pain Today's Vitals   07/13/18 1417 07/13/18 1623 07/13/18 1649 07/13/18 1818  BP: (!) 168/115 (!) 168/98 (!) 144/98 (!) 167/83  Pulse: (!) 111 (!) 108 (!) 104 94  Resp: 20 20 18 18   Temp:      TempSrc:      SpO2: 96% 96% 95% 98%  Weight:      Height:      PainSc:        Isolation Precautions Droplet and Contact precautions  Medications Medications  sodium chloride (PF) 0.9 % injection (has no administration in time range)  warfarin (COUMADIN) tablet 5 mg (has no administration in time range)  Warfarin - Pharmacist Dosing Inpatient (has no administration in time range)  furosemide (LASIX) injection 40 mg (40 mg Intravenous Given 07/13/18 1352)  amLODipine (NORVASC) tablet 5 mg (5 mg Oral Given 07/13/18 1648)  iohexol (OMNIPAQUE) 350 MG/ML injection 100 mL (100 mLs Intravenous Contrast Given 07/13/18 1551)    Mobility walks with person assist Low fall risk   Focused Assessments SOB, CHF   R Recommendations: See Admitting Provider Note  Report given to:   Additional Notes: .

## 2018-07-13 NOTE — ED Notes (Signed)
Bed: WA09 Expected date:  Expected time:  Means of arrival:  Comments: EMS SOB/fever

## 2018-07-13 NOTE — Progress Notes (Signed)
ANTICOAGULATION CONSULT NOTE - Initial Consult  Pharmacy Consult for Coumadin Indication: atrial fibrillation  Allergies  Allergen Reactions  . Codeine     NAUSEA  . Cymbalta [Duloxetine Hcl]     Nausea VOMITING AND ABDOMINAL PAIN, HEADACHE, JUST ABOUT EVERY SIDE EFFECT THE DRUG HAS  . Diltiazem Cd [Diltiazem Hcl Er Beads]     Heavy legs, cough  . Duloxetine     Other reaction(s): Other (See Comments) Nausea VOMITING AND ABDOMINAL PAIN, HEADACHE, JUST ABOUT EVERY SIDE EFFECT THE DRUG HAS  . Metoprolol     Legs like cement  . Nortriptyline     Nightmares  . Penicillins     RASH  & ITCHING   --PT STATES SHE CAN TAKE KEFLEX PO AND IV CEPHALOSPORINS  . Statins     Leg cramps  . Tetanus Toxoid Adsorbed Swelling  . Tetanus Toxoids     SWELLING, REDNESS  WHOLE ARM  . Ciprofloxacin Rash    RASH  . Lyrica [Pregabalin] Diarrhea, Nausea And Vomiting and Rash  . Penicillin G Rash    Patient Measurements: Height: 5' 2"  (157.5 cm) Weight: 210 lb (95.3 kg) IBW/kg (Calculated) : 50.1  Vital Signs: Temp: 98.3 F (36.8 C) (06/01 1209) Temp Source: Rectal (06/01 1209) BP: 144/98 (06/01 1649) Pulse Rate: 104 (06/01 1649)  Labs: Recent Labs    07/13/18 1206  HGB 12.5  HCT 39.0  PLT 135*  LABPROT 20.0*  INR 1.7*  CREATININE 0.61  TROPONINI <0.03    Estimated Creatinine Clearance: 59.4 mL/min (by C-G formula based on SCr of 0.61 mg/dL).   Medical History: Past Medical History:  Diagnosis Date  . Atrial fibrillation ([place])   . Carotid artery occlusion   . Chronic diastolic CHF (congestive heart failure) (HCC)    Hypertensive heart disease 02-12-14  . Crohn's disease ([place])   . Detached retina   . Fibromyalgia   . GERD (gastroesophageal reflux disease)   . Gout   . H/O hiatal hernia   . High triglycerides   . History of stomach ulcers 1950's  . Hypertension   . Long term current use of anticoagulant   . Obstructive sleep apnea on CPAP   . Osteoarthritis    PAIN AND  OA LEF T HIP AND BOTH SHOULDERS ARE BONE ON BONE AND PAINFUL  . Peripheral vascular disease (HCC)    KNOWN RIGHT INTERNAL CAROTID ARTERY OCCLUSION (NO STROKE)  --40 TO 59% STENOSIS LEFT ICA-FOLLOWED BY DR. EARLY WITH DOPPLER STUDY EVERY 6 MONTHS  . Pulmonary embolism ([place]) 2011   a. Hx of PE in 02/2009 after R hip surgery, venous dopplers negative, long-term Coumadin.  [name] Kitchen PVC (premature ventricular contraction)    PT STATES HX OF PVC'S ON EKG  . Recurrent upper respiratory infection (URI)    BRONCHITIS FEB 2013--SLIGHT COUGH NON-PRODUCTIVE NOW  . Recurrent UTI (urinary tract infection)    "on daily medicine" (05/14/2012)  . Tinnitus   . Vertigo     Medications:  Coumadin 2.72m except 3.7613mon MWF.  Last dose 5/31.  Assessment: 89o F on chronic Coumadin for Afib admitted with HF exacerbation.  INR is subtherapeutic on admission (1.7).  Per outpatient records she has been on current regimen for several months & INR was therapeutic (2.0) at office visit on Friday 5/29. No bleeding noted.  CBC WNL.   Goal of Therapy:  INR 2-3   Plan:  Coumadin 13m58mo x1 today Daily INR  LilBiagio Borg1/2020,6:10 PM

## 2018-07-13 NOTE — ED Notes (Addendum)
Pt. Urinated in bedside commode but had an bowl movement in with it. Nurse aware. Will collect urine when pt. Voids.

## 2018-07-13 NOTE — ED Triage Notes (Signed)
EMS reports from home, c/o SOB, cough and fever x 1 week. Hx of COPD, CHF.  BP 200/100 HR 118 RR 20 Sp02 91 RA

## 2018-07-14 ENCOUNTER — Observation Stay (HOSPITAL_BASED_OUTPATIENT_CLINIC_OR_DEPARTMENT_OTHER): Payer: Medicare Other

## 2018-07-14 ENCOUNTER — Observation Stay (HOSPITAL_COMMUNITY): Payer: Medicare Other

## 2018-07-14 DIAGNOSIS — I482 Chronic atrial fibrillation, unspecified: Secondary | ICD-10-CM | POA: Diagnosis present

## 2018-07-14 DIAGNOSIS — I5033 Acute on chronic diastolic (congestive) heart failure: Secondary | ICD-10-CM | POA: Diagnosis present

## 2018-07-14 DIAGNOSIS — Z96643 Presence of artificial hip joint, bilateral: Secondary | ICD-10-CM | POA: Diagnosis present

## 2018-07-14 DIAGNOSIS — Z96612 Presence of left artificial shoulder joint: Secondary | ICD-10-CM | POA: Diagnosis present

## 2018-07-14 DIAGNOSIS — Z6837 Body mass index (BMI) 37.0-37.9, adult: Secondary | ICD-10-CM | POA: Diagnosis not present

## 2018-07-14 DIAGNOSIS — K219 Gastro-esophageal reflux disease without esophagitis: Secondary | ICD-10-CM | POA: Diagnosis present

## 2018-07-14 DIAGNOSIS — I10 Essential (primary) hypertension: Secondary | ICD-10-CM

## 2018-07-14 DIAGNOSIS — E669 Obesity, unspecified: Secondary | ICD-10-CM | POA: Diagnosis present

## 2018-07-14 DIAGNOSIS — M1990 Unspecified osteoarthritis, unspecified site: Secondary | ICD-10-CM | POA: Diagnosis present

## 2018-07-14 DIAGNOSIS — D649 Anemia, unspecified: Secondary | ICD-10-CM | POA: Diagnosis present

## 2018-07-14 DIAGNOSIS — T502X5A Adverse effect of carbonic-anhydrase inhibitors, benzothiadiazides and other diuretics, initial encounter: Secondary | ICD-10-CM | POA: Diagnosis present

## 2018-07-14 DIAGNOSIS — M109 Gout, unspecified: Secondary | ICD-10-CM | POA: Diagnosis present

## 2018-07-14 DIAGNOSIS — I361 Nonrheumatic tricuspid (valve) insufficiency: Secondary | ICD-10-CM

## 2018-07-14 DIAGNOSIS — G4733 Obstructive sleep apnea (adult) (pediatric): Secondary | ICD-10-CM

## 2018-07-14 DIAGNOSIS — J449 Chronic obstructive pulmonary disease, unspecified: Secondary | ICD-10-CM | POA: Diagnosis present

## 2018-07-14 DIAGNOSIS — E785 Hyperlipidemia, unspecified: Secondary | ICD-10-CM

## 2018-07-14 DIAGNOSIS — Z9989 Dependence on other enabling machines and devices: Secondary | ICD-10-CM | POA: Diagnosis not present

## 2018-07-14 DIAGNOSIS — J9621 Acute and chronic respiratory failure with hypoxia: Secondary | ICD-10-CM | POA: Diagnosis present

## 2018-07-14 DIAGNOSIS — E119 Type 2 diabetes mellitus without complications: Secondary | ICD-10-CM | POA: Diagnosis present

## 2018-07-14 DIAGNOSIS — Z96651 Presence of right artificial knee joint: Secondary | ICD-10-CM | POA: Diagnosis present

## 2018-07-14 DIAGNOSIS — D696 Thrombocytopenia, unspecified: Secondary | ICD-10-CM | POA: Diagnosis present

## 2018-07-14 DIAGNOSIS — Z86711 Personal history of pulmonary embolism: Secondary | ICD-10-CM | POA: Diagnosis not present

## 2018-07-14 DIAGNOSIS — Z20828 Contact with and (suspected) exposure to other viral communicable diseases: Secondary | ICD-10-CM | POA: Diagnosis present

## 2018-07-14 DIAGNOSIS — E876 Hypokalemia: Secondary | ICD-10-CM | POA: Diagnosis present

## 2018-07-14 DIAGNOSIS — I110 Hypertensive heart disease with heart failure: Secondary | ICD-10-CM | POA: Diagnosis present

## 2018-07-14 DIAGNOSIS — Z87440 Personal history of urinary (tract) infections: Secondary | ICD-10-CM | POA: Diagnosis not present

## 2018-07-14 DIAGNOSIS — R0602 Shortness of breath: Secondary | ICD-10-CM | POA: Diagnosis not present

## 2018-07-14 LAB — URINALYSIS, ROUTINE W REFLEX MICROSCOPIC
Bilirubin Urine: NEGATIVE
Glucose, UA: NEGATIVE mg/dL
Ketones, ur: NEGATIVE mg/dL
Leukocytes,Ua: NEGATIVE
Nitrite: NEGATIVE
Protein, ur: NEGATIVE mg/dL
Specific Gravity, Urine: 1.011 (ref 1.005–1.030)
pH: 5 (ref 5.0–8.0)

## 2018-07-14 LAB — BASIC METABOLIC PANEL
Anion gap: 11 (ref 5–15)
BUN: 14 mg/dL (ref 8–23)
CO2: 28 mmol/L (ref 22–32)
Calcium: 8.4 mg/dL — ABNORMAL LOW (ref 8.9–10.3)
Chloride: 102 mmol/L (ref 98–111)
Creatinine, Ser: 0.75 mg/dL (ref 0.44–1.00)
GFR calc Af Amer: >60 mL/min (ref >60)
GFR calc non Af Amer: >60 mL/min (ref >60)
Glucose, Bld: 115 mg/dL — ABNORMAL HIGH (ref 70–99)
Potassium: 2.6 mmol/L — CL (ref 3.5–5.1)
Sodium: 141 mmol/L (ref 135–145)

## 2018-07-14 LAB — CBC
HCT: 38 % (ref 36.0–46.0)
Hemoglobin: 11.5 g/dL — ABNORMAL LOW (ref 12.0–15.0)
MCH: 27.8 pg (ref 26.0–34.0)
MCHC: 30.3 g/dL (ref 30.0–36.0)
MCV: 91.8 fL (ref 80.0–100.0)
Platelets: 147 10*3/uL — ABNORMAL LOW (ref 150–400)
RBC: 4.14 MIL/uL (ref 3.87–5.11)
RDW: 17.1 % — ABNORMAL HIGH (ref 11.5–15.5)
WBC: 6.8 10*3/uL (ref 4.0–10.5)
nRBC: 0 % (ref 0.0–0.2)

## 2018-07-14 LAB — ECHOCARDIOGRAM COMPLETE
Height: 62 in
Weight: 3287.5 oz

## 2018-07-14 LAB — PROTIME-INR
INR: 1.7 — ABNORMAL HIGH (ref 0.8–1.2)
Prothrombin Time: 19.5 seconds — ABNORMAL HIGH (ref 11.4–15.2)

## 2018-07-14 LAB — MAGNESIUM: Magnesium: 1.5 mg/dL — ABNORMAL LOW (ref 1.7–2.4)

## 2018-07-14 MED ORDER — FUROSEMIDE 10 MG/ML IJ SOLN: 40.0000 mg | Freq: Once | INTRAMUSCULAR | Status: AC

## 2018-07-14 MED ORDER — FUROSEMIDE 10 MG/ML IJ SOLN
60.0000 mg | Freq: Two times a day (BID) | INTRAMUSCULAR | Status: DC
  Administered 2018-07-15: 60 mg via INTRAVENOUS
  Filled 2018-07-14: qty 6

## 2018-07-14 MED ORDER — INSULIN ASPART 100 UNIT/ML ~~LOC~~ SOLN
0.0000 [IU] | Freq: Three times a day (TID) | SUBCUTANEOUS | Status: DC
  Administered 2018-07-16: 1 [IU] via SUBCUTANEOUS

## 2018-07-14 MED ORDER — MAGNESIUM SULFATE 4 GM/100ML IV SOLN
4.0000 g | Freq: Once | INTRAVENOUS | Status: AC
  Administered 2018-07-14: 4 g via INTRAVENOUS
  Filled 2018-07-14: qty 100

## 2018-07-14 MED ORDER — LOPERAMIDE HCL 2 MG PO CAPS
4.0000 mg | ORAL_CAPSULE | Freq: Once | ORAL | Status: AC
  Administered 2018-07-14: 4 mg via ORAL
  Filled 2018-07-14: qty 2

## 2018-07-14 MED ORDER — POTASSIUM CHLORIDE CRYS ER 20 MEQ PO TBCR
40.0000 meq | EXTENDED_RELEASE_TABLET | ORAL | Status: AC
  Administered 2018-07-14 (×3): 40 meq via ORAL
  Filled 2018-07-14 (×3): qty 2

## 2018-07-14 MED ORDER — WARFARIN SODIUM 5 MG PO TABS
5.0000 mg | ORAL_TABLET | Freq: Once | ORAL | Status: AC
  Administered 2018-07-14: 5 mg via ORAL
  Filled 2018-07-14: qty 1

## 2018-07-14 NOTE — Progress Notes (Signed)
CRITICAL VALUE ALERT  Critical Value:  Potassium 2.6  Date & Time Notied:  0722  Provider Notified: MD Irine Seal  Orders Received/Actions taken: Awaiting further instructions.

## 2018-07-14 NOTE — Progress Notes (Signed)
  Echocardiogram 2D Echocardiogram has been performed.  [name] L Androw 07/14/2018, 9:08 AM

## 2018-07-14 NOTE — Progress Notes (Signed)
Patient requests to try CPAP. Placed on 11cmH20 per home regimen via nasal mask. She states she can not tolerate the feel of the mask and would like to take it off. Placed back on 2 liters O2 via cannula. VSS and RN made aware.

## 2018-07-14 NOTE — Progress Notes (Signed)
PROGRESS NOTE    [name]  YKD:[id number] DOB: Feb 08, 1935 DOA: 07/13/2018 PCP: Deland Pretty, MD    Brief Narrative:  Per Dr. Synetta Fail [name] is a 84 y.o. female with medical history significant of atrial fibrillation, chronic diastolic heart failure, GERD, OSA. Patient reports a week history of worsening shortness of breath that has not improved with diuretic use; she also reports fever at home with a Tmax of 100 degrees farenheit but she states EMS measured a temperature of 101.5 degrees farenheit. No chest pain. She appears to be below her last weight of 220 lbs from two months ago currently at 210 lbs.   ED Course: Vitals: Afebrile, pulse of 90-100s, RR of 18-20, SpO2 of 98% on 2L via nasal canula Labs: Potassium of 3.4, BNP of 249, platelets of 135 Imaging: Chest x-ray and CT scan suggest CHF. No acute PE Medications/Course: IV lasix  Assessment & Plan:   Principal Problem:   Acute on chronic diastolic (congestive) heart failure (HCC) Active Problems:   Dyslipidemia   Osteoarthritis   Normocytic anemia   OSA on CPAP   Essential hypertension  1 acute on chronic diastolic heart failure Questionable etiology.  Patient presented with shortness of breath worsening over the past week.  Chest x-ray and CT chest which was done consistent with acute CHF exacerbation.  BNP elevated at 249.4.  Urine output of 500 cc over the past 24 hours.  Current weight of 93.2 kg from 95.3 kg on admission.  Repeat chest x-ray this morning.  COVID-19 which was done was negative.  Troponin done on admission was negative.  2D echo with a EF of 55 to 60%, severely dilated left atrium, moderately dilated right atrium.  We will give a extra dose of Lasix 40 mg IV x1 today and increase Lasix to 60 mg IV every 12 hours.  Continue Bystolic.  Strict I's and O's.  Daily weights.  2.  Hypokalemia/hypomagnesemia Secondary to diuresis.  Replete.  3.  OSA CPAP nightly.  4.  Hypertension Continue  Bystolic and Norvasc.  5.  Chronic atrial fibrillation Currently rate controlled on Bystolic.  INR of 1.7.  Coumadin per pharmacy.  6.  Gastroesophageal reflux disease Pepcid.  7.  Fever Patient currently afebrile during the hospitalization.  COVID-19 which was checked was negative.  Repeat chest x-ray.  Check a UA with cultures and sensitivities.  No need for antibiotics at this time.  Follow.  8.  Diabetes mellitus type 2 Check a hemoglobin A1c.  Place on sliding scale insulin.  9.  Gout Continue allopurinol.    DVT prophylaxis: Coumadin Code Status: Full Family Communication: Updated patient.  No family at bedside. Disposition Plan: Likely home when clinically improved, euvolemic with resolution of acute on chronic CHF.   Consultants:   None  Procedures:   Chest x-ray 07/14/2018, 07/13/2018  CT chest 07/13/2018  Antimicrobials:   None   Subjective: Patient states shortness of breath has improved since admission.  Patient with a dry cough.  Patient denies any chest pain.  Patient does state prior to admission did have fevers and a cough and sent to the ED due to concerns for COVID-19.  Patient asking when she can go home.  Objective: Vitals:   07/13/18 1818 07/13/18 1939 07/13/18 2026 07/14/18 0405  BP: (!) 167/83 121/82 139/84 (!) 149/65  Pulse: 94 (!) 111 97 81  Resp: 18 (!) 21 18 20   Temp:   98.1 F (36.7 C) 97.8 F (36.6 C)  TempSrc:   Oral   SpO2: 98% 94% 96% 95%  Weight:    93.2 kg  Height:        Intake/Output Summary (Last 24 hours) at 07/14/2018 2013 Last data filed at 07/14/2018 1616 Gross per 24 hour  Intake 479.49 ml  Output 850 ml  Net -370.51 ml   Filed Weights   07/13/18 1052 07/14/18 0405  Weight: 95.3 kg 93.2 kg    Examination:  General exam: Appears calm and comfortable  Respiratory system: Some bibasilar crackles otherwise clear.  No wheezing, no rhonchi.  Respiratory effort normal. Cardiovascular system: S1 & S2 heard, RRR. No JVD,  murmurs, rubs, gallops or clicks. No pedal edema. Gastrointestinal system: Abdomen is nondistended, soft and nontender. No organomegaly or masses felt. Normal bowel sounds heard. Central nervous system: Alert and oriented. No focal neurological deficits. Extremities: Symmetric 5 x 5 power. Skin: No rashes, lesions or ulcers Psychiatry: Judgement and insight appear normal. Mood & affect appropriate.     Data Reviewed: I have personally reviewed following labs and imaging studies  CBC: Recent Labs  Lab 07/13/18 1206 07/14/18 0543  WBC 9.7 6.8  NEUTROABS 7.9*  --   HGB 12.5 11.5*  HCT 39.0 38.0  MCV 89.9 91.8  PLT 135* 993*   Basic Metabolic Panel: Recent Labs  Lab 07/13/18 1206 07/14/18 0543  NA 141 141  K 3.4* 2.6*  CL 104 102  CO2 25 28  GLUCOSE 131* 115*  BUN 11 14  CREATININE 0.61 0.75  CALCIUM 8.8* 8.4*  MG  --  1.5*   GFR: Estimated Creatinine Clearance: 58.6 mL/min (by C-G formula based on SCr of 0.75 mg/dL). Liver Function Tests: Recent Labs  Lab 07/13/18 1206  AST 23  ALT 16  ALKPHOS 96  BILITOT 1.3*  PROT 7.5  ALBUMIN 3.7   No results for input(s): LIPASE, AMYLASE in the last 168 hours. No results for input(s): AMMONIA in the last 168 hours. Coagulation Profile: Recent Labs  Lab 07/10/18 1107 07/13/18 1206 07/14/18 0543  INR 2.0 1.7* 1.7*   Cardiac Enzymes: Recent Labs  Lab 07/13/18 1206  TROPONINI <0.03   BNP (last 3 results) No results for input(s): PROBNP in the last 8760 hours. HbA1C: No results for input(s): HGBA1C in the last 72 hours. CBG: No results for input(s): GLUCAP in the last 168 hours. Lipid Profile: No results for input(s): CHOL, HDL, LDLCALC, TRIG, CHOLHDL, LDLDIRECT in the last 72 hours. Thyroid Function Tests: No results for input(s): TSH, T4TOTAL, FREET4, T3FREE, THYROIDAB in the last 72 hours. Anemia Panel: No results for input(s): VITAMINB12, FOLATE, FERRITIN, TIBC, IRON, RETICCTPCT in the last 72 hours.  Sepsis Labs: Recent Labs  Lab 07/13/18 1206  LATICACIDVEN 1.4    Recent Results (from the past 240 hour(s))  SARS Coronavirus 2 (CEPHEID- Performed in [facility] hospital lab), Hosp Order     Status: None   Collection Time: 07/13/18 11:06 AM  Result Value Ref Range Status   SARS Coronavirus 2 NEGATIVE NEGATIVE Final    Comment: (NOTE) If result is NEGATIVE SARS-CoV-2 target nucleic acids are NOT DETECTED. The SARS-CoV-2 RNA is generally detectable in upper and lower  respiratory specimens during the acute phase of infection. The lowest  concentration of SARS-CoV-2 viral copies this assay can detect is 250  copies / mL. A negative result does not preclude SARS-CoV-2 infection  and should not be used as the sole basis for treatment or other  patient management decisions.  A negative  result may occur with  improper specimen collection / handling, submission of specimen other  than nasopharyngeal swab, presence of viral mutation(s) within the  areas targeted by this assay, and inadequate number of viral copies  (<250 copies / mL). A negative result must be combined with clinical  observations, patient history, and epidemiological information. If result is POSITIVE SARS-CoV-2 target nucleic acids are DETECTED. The SARS-CoV-2 RNA is generally detectable in upper and lower  respiratory specimens dur ing the acute phase of infection.  Positive  results are indicative of active infection with SARS-CoV-2.  Clinical  correlation with patient history and other diagnostic information is  necessary to determine patient infection status.  Positive results do  not rule out bacterial infection or co-infection with other viruses. If result is PRESUMPTIVE POSTIVE SARS-CoV-2 nucleic acids MAY BE PRESENT.   A presumptive positive result was obtained on the submitted specimen  and confirmed on repeat testing.  While 2019 novel coronavirus  (SARS-CoV-2) nucleic acids may be present in the submitted  sample  additional confirmatory testing may be necessary for epidemiological  and / or clinical management purposes  to differentiate between  SARS-CoV-2 and other Sarbecovirus currently known to infect humans.  If clinically indicated additional testing with an alternate test  methodology [phone]) is advised. The SARS-CoV-2 RNA is generally  detectable in upper and lower respiratory sp ecimens during the acute  phase of infection. The expected result is Negative. Fact Sheet for Patients:  StrictlyIdeas.no Fact Sheet for Healthcare Providers: BankingDealers.co.za This test is not yet approved or cleared by the [place] FDA and has been authorized for detection and/or diagnosis of SARS-CoV-2 by FDA under an Emergency Use Authorization (EUA).  This EUA will remain in effect (meaning this test can be used) for the duration of the COVID-19 declaration under Section 564(b)(1) of the Act, 21 U.S.C. section 360bbb-3(b)(1), unless the authorization is terminated or revoked sooner. Performed at [facility], Rule [address], [place], [place] [address]          Radiology Studies: Dg Chest 2 View  Result Date: 07/14/2018 CLINICAL DATA:  Weakness and shortness of breath. EXAM: CHEST - 2 VIEW COMPARISON:  Single-view of the chest and CT chest 07/13/2018. FINDINGS: Pulmonary edema appears unchanged. There is cardiomegaly. No pneumothorax or pleural effusion. Aortic atherosclerosis is seen. The patient is status post left shoulder replacement. Advanced degenerative disease right shoulder noted. IMPRESSION: No changing congestive heart failure. Atherosclerosis. Electronically Signed   By: Inge Rise M.D.   On: 07/14/2018 15:24   Ct Angio Chest Pe W And/or Wo Contrast  Result Date: 07/13/2018 CLINICAL DATA:  Short of breath.  Cough and fever for 1 week EXAM: CT ANGIOGRAPHY CHEST WITH CONTRAST TECHNIQUE: Multidetector CT  imaging of the chest was performed using the standard protocol during bolus administration of intravenous contrast. Multiplanar CT image reconstructions and MIPs were obtained to evaluate the vascular anatomy. CONTRAST:  15m OMNIPAQUE IOHEXOL 350 MG/ML SOLN COMPARISON:  None. FINDINGS: Cardiovascular: No filling defects within the pulmonary arteries to suggest acute pulmonary embolism. No acute findings of the aorta or great vessels. No pericardial fluid. Mediastinum/Nodes: No axillary supraclavicular adenopathy. No mediastinal hilar adenopathy. There are multiple small mediastinal lymph nodes which are not pathologic by size criteria. These are less than 10 mm in the paratracheal mediastinum and the prevascular space. The largest node approaches 10 mm in these substernal prevascular space. There bilateral small hilar lymph nodes additionally which are similar. Lungs/Pleura: Diffuse ground-glass  opacities the upper lobes. Angular nodule in the RIGHT lower lobe measures 6 mm. Small RIGHT effusion LEFT effusion. Upper Abdomen: Limited view of the liver, kidneys, pancreas are unremarkable. Normal adrenal glands. Musculoskeletal: No aggressive osseous lesion. Review of the MIP images confirms the above findings. IMPRESSION: 1. No evidence acute pulmonary embolism. 2. Diffuse ground-glass opacities in the lungs suggest mild pulmonary edema. 3. Numerous subcentimeter mediastinal and hilar lymph nodes are favored reactive. Electronically Signed   By: Suzy Bouchard M.D.   On: 07/13/2018 16:14   Dg Chest Portable 1 View  Result Date: 07/13/2018 CLINICAL DATA:  Shortness of breath and fever. History of congestive heart failure. EXAM: PORTABLE CHEST 1 VIEW COMPARISON:  10/08/2016 FINDINGS: Artifact overlies the chest. There is cardiomegaly. There is an interstitial pulmonary edema pattern. No sign of focal infiltrate, collapse or effusion. Previous shoulder replacement on the left. Chronic degenerative changes of the  right shoulder. IMPRESSION: Cardiomegaly. Interstitial edema. Consistent with congestive heart failure. Electronically Signed   By: Nelson Chimes M.D.   On: 07/13/2018 11:43        Scheduled Meds: . allopurinol  300 mg Oral Daily  . amLODipine  5 mg Oral Daily  . brimonidine  1 drop Both Eyes BID  . dorzolamide-timolol  1 drop Both Eyes BID  . famotidine  20 mg Oral Daily  . furosemide  40 mg Intravenous BID  . latanoprost  1 drop Both Eyes QHS  . loratadine  10 mg Oral Daily  . nebivolol  10 mg Oral QHS  . sodium chloride flush  3 mL Intravenous Q12H  . traZODone  25 mg Oral QHS  . Warfarin - Pharmacist Dosing Inpatient   Does not apply q1800   Continuous Infusions: . sodium chloride       LOS: 0 days    Time spent: 35 minutes    Irine Seal, MD [facility]  If 7PM-7AM, please contact night-coverage [email or web address] 07/14/2018, 8:13 PM

## 2018-07-14 NOTE — Progress Notes (Signed)
[facility] for Coumadin Indication: atrial fibrillation  Allergies  Allergen Reactions  . Codeine     NAUSEA  . Cymbalta [Duloxetine Hcl]     Nausea VOMITING AND ABDOMINAL PAIN, HEADACHE, JUST ABOUT EVERY SIDE EFFECT THE DRUG HAS  . Diltiazem Cd [Diltiazem Hcl Er Beads]     Heavy legs, cough  . Duloxetine     Other reaction(s): Other (See Comments) Nausea VOMITING AND ABDOMINAL PAIN, HEADACHE, JUST ABOUT EVERY SIDE EFFECT THE DRUG HAS  . Metoprolol     Legs like cement  . Nortriptyline     Nightmares  . Penicillins     RASH  & ITCHING   --PT STATES SHE CAN TAKE KEFLEX PO AND IV CEPHALOSPORINS  . Statins     Leg cramps  . Tetanus Toxoid Adsorbed Swelling  . Tetanus Toxoids     SWELLING, REDNESS  WHOLE ARM  . Ciprofloxacin Rash    RASH  . Lyrica [Pregabalin] Diarrhea, Nausea And Vomiting and Rash  . Penicillin G Rash    Patient Measurements: Height: 5' 2"  (157.5 cm) Weight: 205 lb 7.5 oz (93.2 kg) IBW/kg (Calculated) : 50.1  Vital Signs: Temp: 97.8 F (36.6 C) (06/02 0405) Temp Source: Oral (06/01 2026) BP: 149/65 (06/02 0405) Pulse Rate: 81 (06/02 0405)  Labs: Recent Labs    07/13/18 1206 07/14/18 0543  HGB 12.5 11.5*  HCT 39.0 38.0  PLT 135* 147*  LABPROT 20.0* 19.5*  INR 1.7* 1.7*  CREATININE 0.61 0.75  TROPONINI <0.03  --     Estimated Creatinine Clearance: 58.6 mL/min (by C-G formula based on SCr of 0.75 mg/dL).  Medications:  Coumadin 2.70m except 3.7250mon MWF.  Last dose 5/31.  Assessment: [age]o F on chronic Coumadin for Afib admitted with HF exacerbation.  INR is subtherapeutic on admission (1.7).  Per outpatient records she has been on current regimen for several months & INR was therapeutic (2.0) at office visit on Friday 5/29. No bleeding noted.  Hg 12.5, PLTC 135  07/14/2018  Coumadin 5 mg ordered 6/1 NOT charted in Epic.  INR remains low at 1.7. PLTC remains low at 147(chronic thrombocytopenia). No  bleeding reported.   Goal of Therapy:  INR 2-3   Plan:  Coumadin 50m11mo x1 today Daily INR  MicEudelia [email or web address] 07/14/2018 8:25 AM

## 2018-07-15 LAB — CBC
HCT: 36.7 % (ref 36.0–46.0)
Hemoglobin: 11.2 g/dL — ABNORMAL LOW (ref 12.0–15.0)
MCH: 28.1 pg (ref 26.0–34.0)
MCHC: 30.5 g/dL (ref 30.0–36.0)
MCV: 92.2 fL (ref 80.0–100.0)
Platelets: 148 10*3/uL — ABNORMAL LOW (ref 150–400)
RBC: 3.98 MIL/uL (ref 3.87–5.11)
RDW: 17.2 % — ABNORMAL HIGH (ref 11.5–15.5)
WBC: 6.5 10*3/uL (ref 4.0–10.5)
nRBC: 0 % (ref 0.0–0.2)

## 2018-07-15 LAB — GLUCOSE, CAPILLARY
Glucose-Capillary: 110 mg/dL — ABNORMAL HIGH (ref 70–99)
Glucose-Capillary: 111 mg/dL — ABNORMAL HIGH (ref 70–99)
Glucose-Capillary: 112 mg/dL — ABNORMAL HIGH (ref 70–99)
Glucose-Capillary: 116 mg/dL — ABNORMAL HIGH (ref 70–99)

## 2018-07-15 LAB — BASIC METABOLIC PANEL
Anion gap: 8 (ref 5–15)
BUN: 26 mg/dL — ABNORMAL HIGH (ref 8–23)
CO2: 28 mmol/L (ref 22–32)
Calcium: 8.5 mg/dL — ABNORMAL LOW (ref 8.9–10.3)
Chloride: 105 mmol/L (ref 98–111)
Creatinine, Ser: 0.98 mg/dL (ref 0.44–1.00)
GFR calc Af Amer: >60 mL/min (ref >60)
GFR calc non Af Amer: 54 mL/min — ABNORMAL LOW (ref >60)
Glucose, Bld: 120 mg/dL — ABNORMAL HIGH (ref 70–99)
Potassium: 3.7 mmol/L (ref 3.5–5.1)
Sodium: 141 mmol/L (ref 135–145)

## 2018-07-15 LAB — URINE CULTURE: Culture: NO GROWTH

## 2018-07-15 LAB — PROTIME-INR
INR: 1.5 — ABNORMAL HIGH (ref 0.8–1.2)
Prothrombin Time: 17.9 seconds — ABNORMAL HIGH (ref 11.4–15.2)

## 2018-07-15 LAB — HEMOGLOBIN A1C
Hgb A1c MFr Bld: 5.2 % (ref 4.8–5.6)
Mean Plasma Glucose: 102.54 mg/dL

## 2018-07-15 LAB — MAGNESIUM: Magnesium: 2.5 mg/dL — ABNORMAL HIGH (ref 1.7–2.4)

## 2018-07-15 MED ORDER — FUROSEMIDE 10 MG/ML IJ SOLN
40.0000 mg | Freq: Two times a day (BID) | INTRAMUSCULAR | Status: DC
  Administered 2018-07-16: 40 mg via INTRAVENOUS
  Filled 2018-07-15: qty 4

## 2018-07-15 MED ORDER — WARFARIN SODIUM 5 MG PO TABS
5.0000 mg | ORAL_TABLET | Freq: Once | ORAL | Status: AC
  Administered 2018-07-15: 5 mg via ORAL
  Filled 2018-07-15: qty 1

## 2018-07-15 NOTE — Progress Notes (Signed)
SATURATION QUALIFICATIONS: (This note is used to comply with regulatory documentation for home oxygen)  Patient Saturations on Room Air at Rest = 90%  Patient Saturations on Room Air while Ambulating = 80%  Patient Saturations on -- Liters of oxygen while Ambulating =  To be determined  Please briefly explain why patient needs home oxygen: to maintain appropriate SaO2 levels during ambulation.   Blondell Reveal Kistler PT 07/15/2018  Acute Rehabilitation Services Pager [phone] Office [phone]

## 2018-07-15 NOTE — Progress Notes (Signed)
PROGRESS NOTE    [name]  ASN:[id number] DOB: 1934/09/19 DOA: 07/13/2018 PCP: Deland Pretty, MD    Brief Narrative:  84 year old female presented with dyspnea.  She does have significant past medical history for atrial fibrillation, diastolic heart failure, chronic tach instructive sleep apnea.  She had worsening dyspnea despite diuretic use at home. She had positive fever but no chest pain.  On her initial physical examination heart rate 90, respirate 18, oxygen saturation 98% on 2 L per nasal cannula.  Slight decreased breath sounds bilaterally, heart S1-S2 present rhythmic, abdomen soft nontender, no lower extremity edema.  Chest radiograph with bilateral interstitial markings, small left pleural effusion.  CT chest with bilateral groundglass opacities.  EKG 110 bpm, normal axis, atrial fibrillation rhythm, no significant ST segment changes, T wave inversions in lead II, III, aVF, V5 and V6.   Patient was admitted to the hospital with the working diagnosis of acute decompensated diastolic heart failure.   Assessment & Plan:   Principal Problem:   Acute on chronic diastolic (congestive) heart failure (HCC) Active Problems:   Dyslipidemia   Osteoarthritis   Normocytic anemia   OSA on CPAP   Essential hypertension   1. Acute and chronic diastolic heart failure. Improved volume status but not yet back to baseline, urine output over last 24 H is 1,200 ml. Echocardiogram with preserved LV systolic function EF 55 to 60%. Will continue to diurese with IV furosemide. Continue blood pressure monitoring.  2. T2DM. Will continue glucose cover and monitoring with insulin sliding scale.   3. HTN. Continue blood pressure control with bystolic and amlodipine.   4. Chronic atrial fibrillation. Continue rate control with bystolic and anticoagulation with warfarin.   5, fever. No signs of deep infection, SARS COVID 19 NEGATIVE.   6. Obesity. Calculated BMI is 37,4.   DVT prophylaxis:  enoxaparin   Code Status: full Family Communication: no family at the bedside  Disposition Plan/ discharge barriers: pending clinical improvement.   Body mass index is 37.54 kg/m. Malnutrition Type:      Malnutrition Characteristics:      Nutrition Interventions:     RN Pressure Injury Documentation:     Consultants:     Procedures:     Antimicrobials:       Subjective: Patient continue to have dyspnea, which has improved but not yet back to baseline, no nausea or vomiting, no chest pain.   Objective: Vitals:   07/14/18 2325 07/15/18 0532 07/15/18 1258 07/15/18 1259  BP: (!) 130/98 132/80  138/69  Pulse: (!) 103 94  96  Resp: 20 20  20   Temp: 98.3 F (36.8 C) 98.4 F (36.9 C)  98.7 F (37.1 C)  TempSrc: Oral Axillary  Oral  SpO2: 93% 95% (!) 83% 93%  Weight:  93.1 kg    Height:        Intake/Output Summary (Last 24 hours) at 07/15/2018 1357 Last data filed at 07/15/2018 1306 Gross per 24 hour  Intake 619.49 ml  Output 1900 ml  Net -1280.51 ml   Filed Weights   07/13/18 1052 07/14/18 0405 07/15/18 0532  Weight: 95.3 kg 93.2 kg 93.1 kg    Examination:   General: deconditioned  Neurology: Awake and alert, non focal  E ENT: no pallor, no icterus, oral mucosa moist Cardiovascular: No JVD. S1-S2 present, rhythmic, no gallops, rubs, or murmurs. +  lower extremity edema. Pulmonary: positive breath sounds bilaterally, adequate air movement, no wheezing, rhonchi or rales. Gastrointestinal. Abdomen with no  organomegaly, non tender, no rebound or guarding Skin. No rashes Musculoskeletal: no joint deformities     Data Reviewed: I have personally reviewed following labs and imaging studies  CBC: Recent Labs  Lab 07/13/18 1206 07/14/18 0543 07/15/18 0604  WBC 9.7 6.8 6.5  NEUTROABS 7.9*  --   --   HGB 12.5 11.5* 11.2*  HCT 39.0 38.0 36.7  MCV 89.9 91.8 92.2  PLT 135* 147* 158*   Basic Metabolic Panel: Recent Labs  Lab 07/13/18 1206  07/14/18 0543 07/15/18 0604  NA 141 141 141  K 3.4* 2.6* 3.7  CL 104 102 105  CO2 25 28 28   GLUCOSE 131* 115* 120*  BUN 11 14 26*  CREATININE 0.61 0.75 0.98  CALCIUM 8.8* 8.4* 8.5*  MG  --  1.5* 2.5*   GFR: Estimated Creatinine Clearance: 47.8 mL/min (by C-G formula based on SCr of 0.98 mg/dL). Liver Function Tests: Recent Labs  Lab 07/13/18 1206  AST 23  ALT 16  ALKPHOS 96  BILITOT 1.3*  PROT 7.5  ALBUMIN 3.7   No results for input(s): LIPASE, AMYLASE in the last 168 hours. No results for input(s): AMMONIA in the last 168 hours. Coagulation Profile: Recent Labs  Lab 07/10/18 1107 07/13/18 1206 07/14/18 0543 07/15/18 0604  INR 2.0 1.7* 1.7* 1.5*   Cardiac Enzymes: Recent Labs  Lab 07/13/18 1206  TROPONINI <0.03   BNP (last 3 results) No results for input(s): PROBNP in the last 8760 hours. HbA1C: Recent Labs    07/15/18 0604  HGBA1C 5.2   CBG: Recent Labs  Lab 07/15/18 0752 07/15/18 1146  GLUCAP 110* 111*   Lipid Profile: No results for input(s): CHOL, HDL, LDLCALC, TRIG, CHOLHDL, LDLDIRECT in the last 72 hours. Thyroid Function Tests: No results for input(s): TSH, T4TOTAL, FREET4, T3FREE, THYROIDAB in the last 72 hours. Anemia Panel: No results for input(s): VITAMINB12, FOLATE, FERRITIN, TIBC, IRON, RETICCTPCT in the last 72 hours.    Radiology Studies: I have reviewed all of the imaging during this hospital visit personally     Scheduled Meds: . allopurinol  300 mg Oral Daily  . amLODipine  5 mg Oral Daily  . brimonidine  1 drop Both Eyes BID  . dorzolamide-timolol  1 drop Both Eyes BID  . famotidine  20 mg Oral Daily  . furosemide  60 mg Intravenous BID  . insulin aspart  0-9 Units Subcutaneous TID WC  . latanoprost  1 drop Both Eyes QHS  . loratadine  10 mg Oral Daily  . nebivolol  10 mg Oral QHS  . sodium chloride flush  3 mL Intravenous Q12H  . traZODone  25 mg Oral QHS  . Warfarin - Pharmacist Dosing Inpatient   Does not  apply q1800   Continuous Infusions: . sodium chloride       LOS: 1 day        Calissa Swenor Gerome Apley, MD

## 2018-07-15 NOTE — Progress Notes (Signed)
ANTICOAGULATION CONSULT NOTE  Pharmacy Consult for Coumadin Indication: atrial fibrillation Patient Measurements: Height: 5' 2"  (157.5 cm) Weight: 205 lb 4 oz (93.1 kg) IBW/kg (Calculated) : 50.1  Vital Signs: Temp: 98.7 F (37.1 C) (06/03 1259) Temp Source: Oral (06/03 1259) BP: 138/69 (06/03 1259) Pulse Rate: 96 (06/03 1259)  Labs: Recent Labs    07/13/18 1206 07/14/18 0543 07/15/18 0604  HGB 12.5 11.5* 11.2*  HCT 39.0 38.0 36.7  PLT 135* 147* 148*  LABPROT 20.0* 19.5* 17.9*  INR 1.7* 1.7* 1.5*  CREATININE 0.61 0.75 0.98  TROPONINI <0.03  --   --     Estimated Creatinine Clearance: 47.8 mL/min (by C-G formula based on SCr of 0.98 mg/dL).  Medications:  Coumadin 2.51m except 3.741mon MWF.  Last dose 5/31.  Assessment: [age]o F on chronic Coumadin for Afib admitted with HF exacerbation.  INR is subtherapeutic on admission (1.7).  Per outpatient records she has been on current regimen for several months & INR was therapeutic (2.0) at office visit on Friday 5/29. No bleeding noted.  Hg 12.5, PLTC 135  07/15/2018  Coumadin 5 mg ordered 6/1 NOT charted in Epic.  INR remains low at 1.5. PLTC remains low at 148(chronic thrombocytopenia). No bleeding reported.   Goal of Therapy:  INR 2-3   Plan:  Repeat Coumadin 20m80mo x1 today Daily INR  MicEudelia [email or web address] 07/15/2018 2:21 PM

## 2018-07-15 NOTE — Evaluation (Signed)
Physical Therapy Evaluation Patient Details Name: [name] MRN: [id number] DOB: 1934/08/24 Today's Date: 07/15/2018   History of Present Illness  84 y.o. female with medical history significant of atrial fibrillation, chronic diastolic heart failure, GERD, OSA. Patient reports a week history of worsening shortness of breath that has not improved with diuretic use. Dx of acute on chronic CHF.  Clinical Impression  Pt admitted with above diagnosis. Pt currently with functional limitations due to the deficits listed below (see PT Problem List). Pt ambulated 55' with RW, SaO2 90% on room air at rest, 80-84% on room air ambulating. Distance limited by fatigue.  Pt will benefit from skilled PT to increase their independence and safety with mobility to allow discharge to the venue listed below.       Follow Up Recommendations No PT follow up(pt plans to start HHPT after she sees her primary care Dr.)    Equipment Recommendations  Home O2   Recommendations for Other Services       Precautions / Restrictions Precautions Precautions: Other (comment) Precaution Comments: monitor O2, denies falls in past 1 year Restrictions Weight Bearing Restrictions: No      Mobility  Bed Mobility               General bed mobility comments: up in recliner  Transfers Overall transfer level: Modified independent Equipment used: None             General transfer comment: used arm rests  Ambulation/Gait Ambulation/Gait assistance: Supervision Gait Distance (Feet): 55 Feet Assistive device: Rolling walker (2 wheeled) Gait Pattern/deviations: Step-through pattern;Decreased stride length Gait velocity: decr   General Gait Details: SaO2 80-84% on room air while ambulating, 2/4 dyspnea, 90% on room air at rest; distance limited by fatigue/dyspnea  Stairs            Wheelchair Mobility    Modified Rankin (Stroke Patients Only)       Balance Overall balance assessment:  Modified Independent                                           Pertinent Vitals/Pain Pain Assessment: No/denies pain    Home Living Family/patient expects to be discharged to:: Private residence Living Arrangements: Spouse/significant other Available Help at Discharge: Family;Available 24 hours/day Type of Home: House Home Access: Stairs to enter Entrance Stairs-Rails: Right Entrance Stairs-Number of Steps: 4 Home Layout: One level Home Equipment: Toilet riser;Cane - single point;Walker - 4 wheels;Grab bars - tub/shower;Shower seat      Prior Function Level of Independence: Independent with assistive device(s)         Comments: walks with SPC     Hand Dominance        Extremity/Trunk Assessment   Upper Extremity Assessment Upper Extremity Assessment: Overall WFL for tasks assessed    Lower Extremity Assessment Lower Extremity Assessment: Overall WFL for tasks assessed(decreased sensation to light touch B feet, pt reports h/o peripheral neuropathy)    Cervical / Trunk Assessment Cervical / Trunk Assessment: Normal  Communication   Communication: No difficulties  Cognition Arousal/Alertness: Awake/alert Behavior During Therapy: WFL for tasks assessed/performed Overall Cognitive Status: Within Functional Limits for tasks assessed  General Comments      Exercises     Assessment/Plan    PT Assessment Patient needs continued PT services  PT Problem List Cardiopulmonary status limiting activity;Decreased mobility;Decreased activity tolerance       PT Treatment Interventions DME instruction;Gait training;Therapeutic exercise;Therapeutic activities;Functional mobility training;Patient/family education    PT Goals (Current goals can be found in the Care Plan section)  Acute Rehab PT Goals Patient Stated Goal: to walk farther PT Goal Formulation: With patient Time For Goal Achievement:  07/29/18 Potential to Achieve Goals: Good    Frequency Min 3X/week   Barriers to discharge        Co-evaluation               AM-PAC PT "6 Clicks" Mobility  Outcome Measure Help needed turning from your back to your side while in a flat bed without using bedrails?: A Little Help needed moving from lying on your back to sitting on the side of a flat bed without using bedrails?: A Little Help needed moving to and from a bed to a chair (including a wheelchair)?: A Little Help needed standing up from a chair using your arms (e.g., wheelchair or bedside chair)?: None Help needed to walk in hospital room?: None Help needed climbing 3-5 steps with a railing? : A Little 6 Click Score: 20    End of Session Equipment Utilized During Treatment: Gait belt Activity Tolerance: Patient limited by fatigue Patient left: in chair;with call bell/phone within reach;with chair alarm set Nurse Communication: Mobility status PT Visit Diagnosis: Difficulty in walking, not elsewhere classified (R26.2)    Time: 1545-1610 PT Time Calculation (min) (ACUTE ONLY): 25 min   Charges:   PT Evaluation $PT Eval Low Complexity: 1 Low PT Treatments $Gait Training: 8-22 mins        Blondell Reveal Kistler PT 07/15/2018  Acute Rehabilitation Services Pager [phone] Office [phone]

## 2018-07-16 LAB — BASIC METABOLIC PANEL
Anion gap: 10 (ref 5–15)
BUN: 27 mg/dL — ABNORMAL HIGH (ref 8–23)
CO2: 29 mmol/L (ref 22–32)
Calcium: 9.1 mg/dL (ref 8.9–10.3)
Chloride: 102 mmol/L (ref 98–111)
Creatinine, Ser: 0.85 mg/dL (ref 0.44–1.00)
GFR calc Af Amer: >60 mL/min (ref >60)
GFR calc non Af Amer: >60 mL/min (ref >60)
Glucose, Bld: 123 mg/dL — ABNORMAL HIGH (ref 70–99)
Potassium: 3.4 mmol/L — ABNORMAL LOW (ref 3.5–5.1)
Sodium: 141 mmol/L (ref 135–145)

## 2018-07-16 LAB — PROTIME-INR
INR: 1.6 — ABNORMAL HIGH (ref 0.8–1.2)
Prothrombin Time: 19.2 seconds — ABNORMAL HIGH (ref 11.4–15.2)

## 2018-07-16 LAB — GLUCOSE, CAPILLARY
Glucose-Capillary: 108 mg/dL — ABNORMAL HIGH (ref 70–99)
Glucose-Capillary: 143 mg/dL — ABNORMAL HIGH (ref 70–99)

## 2018-07-16 MED ORDER — FUROSEMIDE 20 MG PO TABS: 20.0000 mg | ORAL_TABLET | Freq: Every day | ORAL | 0 refills | Status: DC

## 2018-07-16 MED ORDER — POTASSIUM CHLORIDE CRYS ER 10 MEQ PO TBCR: 20.0000 meq | EXTENDED_RELEASE_TABLET | Freq: Every day | ORAL | 0 refills | Status: DC

## 2018-07-16 MED ORDER — POTASSIUM CHLORIDE CRYS ER 10 MEQ PO TBCR
40.0000 meq | EXTENDED_RELEASE_TABLET | Freq: Once | ORAL | Status: AC
  Administered 2018-07-16: 40 meq via ORAL
  Filled 2018-07-16: qty 4
  Filled 2018-07-16: qty 2

## 2018-07-16 NOTE — TOC Transition Note (Signed)
Transition of Care [facility]) - CM/SW Discharge Note   Patient Details  Name: [name] MRN: [id number] Date of Birth: [birth year]  Transition of Care [facility]) CM/SW Contact:  Dessa Phi, RN Phone Number: 07/16/2018, 11:20 AM   Clinical Narrative: Qualifies for home 02, 02 order placed. [employer] rep Zach aware of orders, & d/c today-he will deliver home 02 tank to rm prior d/c. No further CM needs.      Final next level of care: Home/Self Care Barriers to Discharge: No Barriers Identified   Patient Goals and CMS Choice   CMS Medicare.gov Compare Post Acute Care list provided to:: Patient Choice offered to / list presented to : Patient  Discharge Placement                       Discharge Plan and Services                DME Arranged: Oxygen DME Agency: [employer] Date DME Agency Contacted: 07/16/18 Time DME Agency Contacted: [id number] Representative spoke with at DME Agency: [place] (Marie) Interventions     Readmission Risk Interventions No flowsheet data found.

## 2018-07-16 NOTE — Progress Notes (Signed)
Physical Therapy Treatment Patient Details Name: [name] MRN: [id number] DOB: 05-18-1934 Today's Date: 07/16/2018    History of Present Illness 84 y.o. female with medical history significant of atrial fibrillation, chronic diastolic heart failure, GERD, OSA. Patient reports a week history of worsening shortness of breath that has not improved with diuretic use. Dx of acute on chronic CHF.    PT Comments    Pt friendly and willing to participate with therapy.  Able to safely stand and pivot to [facility] without AD.  Used RW for distance gait training for stability with no LOB episodes.  Pt limited by decreased O2 saturation to 84% during gait though able to return to 91% upon rest.  Discussed benefits with additional O2 during gait and home use.  EOS pt left in chair with call bell within reach and chair alarm set.    Follow Up Recommendations  No PT follow up     Equipment Recommendations  Other (comment)(home O2)    Recommendations for Other Services       Precautions / Restrictions Precautions Precautions: Other (comment) Precaution Comments: monitor O2, denies falls in past 1 year    Mobility  Bed Mobility Overal bed mobility: Modified Independent                Transfers Overall transfer level: Modified independent Equipment used: None             General transfer comment: used arm rests  Ambulation/Gait Ambulation/Gait assistance: Supervision Gait Distance (Feet): 55 Feet Assistive device: Rolling walker (2 wheeled) Gait Pattern/deviations: Step-through pattern;Decreased stride length Gait velocity: decr   General Gait Details: O2 sat decreased to 84% while ambulating, able to return to 91% within 60-90" at rest; distance limited by fatigue and dyspnea   Stairs             Wheelchair Mobility    Modified Rankin (Stroke Patients Only)       Balance                                            Cognition  Arousal/Alertness: Awake/alert Behavior During Therapy: WFL for tasks assessed/performed Overall Cognitive Status: Within Functional Limits for tasks assessed                                        Exercises General Exercises - Lower Extremity Long Arc Quad: Both;15 reps;Seated Hip Flexion/Marching: Both;10 reps;Seated;Strengthening Toe Raises: Both;20 reps;Strengthening;Seated Heel Raises: Both;20 reps;Strengthening;Seated    General Comments        Pertinent Vitals/Pain Pain Assessment: No/denies pain    Home Living                      Prior Function            PT Goals (current goals can now be found in the care plan section)      Frequency    Min 3X/week      PT Plan Current plan remains appropriate    Co-evaluation              AM-PAC PT "6 Clicks" Mobility   Outcome Measure  Help needed turning from your back to your side while in a flat bed without using bedrails?: A Little Help  needed moving from lying on your back to sitting on the side of a flat bed without using bedrails?: A Little Help needed moving to and from a bed to a chair (including a wheelchair)?: A Little Help needed standing up from a chair using your arms (e.g., wheelchair or bedside chair)?: None Help needed to walk in hospital room?: None Help needed climbing 3-5 steps with a railing? : A Little 6 Click Score: 20    End of Session Equipment Utilized During Treatment: Gait belt Activity Tolerance: Patient limited by fatigue Patient left: in chair;with call bell/phone within reach;with chair alarm set Nurse Communication: Mobility status PT Visit Diagnosis: Difficulty in walking, not elsewhere classified (R26.2)     Time: 1150-1210 PT Time Calculation (min) (ACUTE ONLY): 20 min  Charges:  $Gait Training: 8-22 mins $Therapeutic Exercise: 8-22 mins                     [address], LPTA; [place]  Aldona Lento 07/16/2018, 1:38  PM

## 2018-07-16 NOTE — Progress Notes (Signed)
SATURATION QUALIFICATIONS: (This note is used to comply with regulatory documentation for home oxygen)  Patient Saturations on Room Air at Rest = 91%  Patient Saturations on Room Air while Ambulating = 83%  Patient Saturations on 2 Liters of oxygen while Ambulating = 92%  Please briefly explain why patient needs home oxygen: Patients oxygen decreases while she's ambulating down into the lower 80's%.

## 2018-07-16 NOTE — Discharge Summary (Signed)
Physician Discharge Summary  CJ BEECHER HKV:[id number] DOB: [birth year] DOA: 07/13/2018  PCP: Deland Pretty, MD  Admit date: 07/13/2018 Discharge date: 07/16/2018  Admitted From: home  Disposition:  Home   Recommendations for Outpatient Follow-up and new medication changes:  1. Follow up with Dr. Shelia Media in 7 days.  2. Patient has been started on furosemide 20 mg daily and 20 meq Kcl daily, follow renal panel in 7 days.  3. Patient had oxygen desaturation while ambulating, will order continuous supplemental 02 per [place] at 2 LMP, follow oxymetry as outpatient.   Home Health: no   Equipment/Devices: no    Discharge Condition: stable  CODE STATUS: full  Diet recommendation: heart healthy (low sodium and fluid restriction, 1200 ml per day).   Brief/Interim Summary: 84 year old female presented with dyspnea.  She does have significant past medical history for atrial fibrillation, diastolic heart failure, and obstructive sleep apnea.  She had worsening dyspnea despite diuretic use at home. She had positive fever but no chest pain.  On her initial physical examination heart rate 90, respiratory rate 18, oxygen saturation 98% on 2 L per nasal cannula.  Slight decreased breath sounds bilaterally, heart S1-S2 present rhythmic, abdomen soft nontender, no lower extremity edema.  Sodium 141, potassium 2.6, chloride 102, bicarb 28, glucose 115, BUN 14, creatinine 0.75, white count 6.8, hemoglobin 9.5, hematocrit 38.0, platelets 147.   INR 1.6. SARS COVID-19 negative.  Chest radiograph with bilateral interstitial markings, small left pleural effusion.  CT chest with bilateral groundglass opacities, negative for pulmonary embolism.  EKG 110 bpm, normal axis, atrial fibrillation rhythm, no significant ST segment changes, T wave inversions in lead II, III, aVF, V5 and V6.   Patient was admitted to the hospital with the working diagnosis of acute decompensated diastolic heart failure.  1.  Diastolic heart failure  decompensation, complicated by acute cardiogenic pulmonary edema and acute on chronic hypoxic respiratory failure.  She was admitted to the medical ward, she was placed on remote telemetry monitor, received aggressive diuresis with IV furosemide, negative fluid balance was achieved (2,110 ml), with significant improvement of her symptoms.  Further work-up with echocardiography showed preserved LV systolic function 55 to 56%.  No significant valvular disease.  Her discharge oximetry was 90% at rest but desaturated down to 80% on ambulation.  Patient will be discharged on supplemental 02 per [place], daily furosemide 20 mg daily, continue beta-blockade with nebivolol.   2.  Chronic atrial fibrillation.  Patient remains in atrial fibrillation rhythm, continue anticoagulation with warfarin and rate controlled with nebivolol.  Discharge INR 1.6  3.  Hypokalemia.  Likely due to loop diuretic, patient will continue potassium supplements, 20 mEq daily.  Follow-up renal function next week.  4.  Hypertension.  Continue amlodipine for blood pressure control.  5.  Fever.  Patient remained afebrile during her hospitalization, her SARS COVID-19 was negative.  6.  Obesity.  BMI 37.4  7. Chronic hypoxic respiratory failure. Check oxymetry as outpatient on room air, if persistent low, to consider further work up with pulmonary function testing with DLCO.   Discharge Diagnoses:  Principal Problem:   Acute on chronic diastolic (congestive) heart failure (HCC) Active Problems:   Dyslipidemia   Osteoarthritis   Normocytic anemia   OSA on CPAP   Essential hypertension    Discharge Instructions   Allergies as of 07/16/2018      Reactions   Codeine    NAUSEA   Cymbalta [duloxetine Hcl]    Nausea VOMITING AND  ABDOMINAL PAIN, HEADACHE, JUST ABOUT EVERY SIDE EFFECT THE DRUG HAS   Diltiazem Cd [diltiazem Hcl Er Beads]    Heavy legs, cough   Duloxetine    Other reaction(s): Other (See Comments) Nausea VOMITING  AND ABDOMINAL PAIN, HEADACHE, JUST ABOUT EVERY SIDE EFFECT THE DRUG HAS   Metoprolol    Legs like cement   Nortriptyline    Nightmares   Penicillins    RASH  & ITCHING   --PT STATES SHE CAN TAKE KEFLEX PO AND IV CEPHALOSPORINS   Statins    Leg cramps   Tetanus Toxoid Adsorbed Swelling   Tetanus Toxoids    SWELLING, REDNESS  WHOLE ARM   Ciprofloxacin Rash   RASH   Lyrica [pregabalin] Diarrhea, Nausea And Vomiting, Rash   Penicillin G Rash      Medication List    STOP taking these medications   aMILoride 5 MG tablet Commonly known as:  MIDAMOR   NONFORMULARY OR COMPOUNDED ITEM   ondansetron 4 MG disintegrating tablet Commonly known as:  Zofran ODT     TAKE these medications   allopurinol 300 MG tablet Commonly known as:  ZYLOPRIM Take 300 mg by mouth daily.   amLODipine 5 MG tablet Commonly known as:  NORVASC Take 5 mg by mouth daily.   brimonidine 0.2 % ophthalmic solution Commonly known as:  ALPHAGAN 1 drop 2 (two) times daily.   celecoxib 100 MG capsule Commonly known as:  CELEBREX Take 100 mg by mouth daily.   cetirizine 10 MG tablet Commonly known as:  ZYRTEC Take 10 mg by mouth daily.   cimetidine 200 MG tablet Commonly known as:  TAGAMET Take 200 mg by mouth 2 (two) times daily.   CRANBERRY CONCENTRATE PO Take 1 tablet by mouth daily.   dorzolamide-timolol 22.3-6.8 MG/ML ophthalmic solution Commonly known as:  COSOPT Place 1 drop into both eyes 2 (two) times daily.   furosemide 20 MG tablet Commonly known as:  Lasix Take 1 tablet (20 mg total) by mouth daily for 30 days. What changed:  See the new instructions.   HYDROcodone-acetaminophen 5-325 MG tablet Commonly known as:  NORCO/VICODIN Take 0.5-1 tablets by mouth 3 (three) times daily as needed for pain.   ipratropium 0.06 % nasal spray Commonly known as:  ATROVENT Place 1 spray into both nostrils daily as needed (allergies).   Lumigan 0.01 % Soln Generic drug:  bimatoprost Place 1  drop into both eyes at bedtime.   mupirocin ointment 2 % Commonly known as:  BACTROBAN Apply 1 application topically 2 (two) times daily.   Nasacort Allergy 24HR 55 MCG/ACT Aero nasal inhaler Generic drug:  triamcinolone Place 2 sprays into the nose daily as needed (allergies).   nebivolol 10 MG tablet Commonly known as:  BYSTOLIC Take 1 tablet (10 mg total) by mouth at bedtime.   potassium chloride 10 MEQ tablet Commonly known as:  K-DUR Take 2 tablets (20 mEq total) by mouth daily for 30 days. MWF What changed:    how much to take  when to take this   silver sulfADIAZINE 1 % cream Commonly known as:  Silvadene Apply 1 application topically daily.   traZODone 50 MG tablet Commonly known as:  DESYREL Take 25 mg by mouth at bedtime.   warfarin 2.5 MG tablet Commonly known as:  COUMADIN Take as directed. If you are unsure how to take this medication, talk to your nurse or doctor. Original instructions:  TAKE 1 TO 1&1/2 TABLETS DAILY AS DIRECTED BY  [facility]. What changed:  See the new instructions.       Allergies  Allergen Reactions  . Codeine     NAUSEA  . Cymbalta [Duloxetine Hcl]     Nausea VOMITING AND ABDOMINAL PAIN, HEADACHE, JUST ABOUT EVERY SIDE EFFECT THE DRUG HAS  . Diltiazem Cd [Diltiazem Hcl Er Beads]     Heavy legs, cough  . Duloxetine     Other reaction(s): Other (See Comments) Nausea VOMITING AND ABDOMINAL PAIN, HEADACHE, JUST ABOUT EVERY SIDE EFFECT THE DRUG HAS  . Metoprolol     Legs like cement  . Nortriptyline     Nightmares  . Penicillins     RASH  & ITCHING   --PT STATES SHE CAN TAKE KEFLEX PO AND IV CEPHALOSPORINS  . Statins     Leg cramps  . Tetanus Toxoid Adsorbed Swelling  . Tetanus Toxoids     SWELLING, REDNESS  WHOLE ARM  . Ciprofloxacin Rash    RASH  . Lyrica [Pregabalin] Diarrhea, Nausea And Vomiting and Rash  . Penicillin G Rash    Consultations:     Procedures/Studies: Dg Chest 2 View  Result Date:  07/14/2018 CLINICAL DATA:  Weakness and shortness of breath. EXAM: CHEST - 2 VIEW COMPARISON:  Single-view of the chest and CT chest 07/13/2018. FINDINGS: Pulmonary edema appears unchanged. There is cardiomegaly. No pneumothorax or pleural effusion. Aortic atherosclerosis is seen. The patient is status post left shoulder replacement. Advanced degenerative disease right shoulder noted. IMPRESSION: No changing congestive heart failure. Atherosclerosis. Electronically Signed   By: Inge Rise M.D.   On: 07/14/2018 15:24   Ct Angio Chest Pe W And/or Wo Contrast  Result Date: 07/13/2018 CLINICAL DATA:  Short of breath.  Cough and fever for 1 week EXAM: CT ANGIOGRAPHY CHEST WITH CONTRAST TECHNIQUE: Multidetector CT imaging of the chest was performed using the standard protocol during bolus administration of intravenous contrast. Multiplanar CT image reconstructions and MIPs were obtained to evaluate the vascular anatomy. CONTRAST:  171m OMNIPAQUE IOHEXOL 350 MG/ML SOLN COMPARISON:  None. FINDINGS: Cardiovascular: No filling defects within the pulmonary arteries to suggest acute pulmonary embolism. No acute findings of the aorta or great vessels. No pericardial fluid. Mediastinum/Nodes: No axillary supraclavicular adenopathy. No mediastinal hilar adenopathy. There are multiple small mediastinal lymph nodes which are not pathologic by size criteria. These are less than 10 mm in the paratracheal mediastinum and the prevascular space. The largest node approaches 10 mm in these substernal prevascular space. There bilateral small hilar lymph nodes additionally which are similar. Lungs/Pleura: Diffuse ground-glass opacities the upper lobes. Angular nodule in the RIGHT lower lobe measures 6 mm. Small RIGHT effusion LEFT effusion. Upper Abdomen: Limited view of the liver, kidneys, pancreas are unremarkable. Normal adrenal glands. Musculoskeletal: No aggressive osseous lesion. Review of the MIP images confirms the above  findings. IMPRESSION: 1. No evidence acute pulmonary embolism. 2. Diffuse ground-glass opacities in the lungs suggest mild pulmonary edema. 3. Numerous subcentimeter mediastinal and hilar lymph nodes are favored reactive. Electronically Signed   By: SSuzy BouchardM.D.   On: 07/13/2018 16:14   Dg Chest Portable 1 View  Result Date: 07/13/2018 CLINICAL DATA:  Shortness of breath and fever. History of congestive heart failure. EXAM: PORTABLE CHEST 1 VIEW COMPARISON:  10/08/2016 FINDINGS: Artifact overlies the chest. There is cardiomegaly. There is an interstitial pulmonary edema pattern. No sign of focal infiltrate, collapse or effusion. Previous shoulder replacement on the left. Chronic degenerative changes of the right shoulder. IMPRESSION: Cardiomegaly.  Interstitial edema. Consistent with congestive heart failure. Electronically Signed   By: Nelson Chimes M.D.   On: 07/13/2018 11:43      Procedures:   Subjective: Patient is feeling better, no nausea or vomiting, no chest pain, dyspnea continue to improve. Positive oxygen desaturation on ambulation.   Discharge Exam: Vitals:   07/16/18 0638 07/16/18 0858  BP: 135/66   Pulse: 76   Resp: 18   Temp: 98.6 F (37 C)   SpO2: 95% 91%   Vitals:   07/15/18 1908 07/15/18 2202 07/16/18 0638 07/16/18 0858  BP:  (!) 149/93 135/66   Pulse: 89 80 76   Resp: 18 18 18    Temp:  98.2 F (36.8 C) 98.6 F (37 C)   TempSrc:  Oral Oral   SpO2: 93% 97% 95% 91%  Weight:      Height:        General: not in pain or dyspnea.  Neurology: Awake and alert, non focal  E ENT: no pallor, no icterus, oral mucosa moist Cardiovascular: No JVD. S1-S2 present, rhythmic, no gallops, rubs, or murmurs. No lower extremity edema. Pulmonary: vesicular breath sounds bilaterally, adequate air movement, no wheezing, rhonchi or rales. Gastrointestinal. Abdomen with no organomegaly, non tender, no rebound or guarding Skin. No rashes Musculoskeletal: no joint  deformities   The results of significant diagnostics from this hospitalization (including imaging, microbiology, ancillary and laboratory) are listed below for reference.     Microbiology: Recent Results (from the past 240 hour(s))  SARS Coronavirus 2 (CEPHEID- Performed in [place] hospital lab), Hosp Order     Status: None   Collection Time: 07/13/18 11:06 AM  Result Value Ref Range Status   SARS Coronavirus 2 NEGATIVE NEGATIVE Final    Comment: (NOTE) If result is NEGATIVE SARS-CoV-2 target nucleic acids are NOT DETECTED. The SARS-CoV-2 RNA is generally detectable in upper and lower  respiratory specimens during the acute phase of infection. The lowest  concentration of SARS-CoV-2 viral copies this assay can detect is 250  copies / mL. A negative result does not preclude SARS-CoV-2 infection  and should not be used as the sole basis for treatment or other  patient management decisions.  A negative result may occur with  improper specimen collection / handling, submission of specimen other  than nasopharyngeal swab, presence of viral mutation(s) within the  areas targeted by this assay, and inadequate number of viral copies  (<250 copies / mL). A negative result must be combined with clinical  observations, patient history, and epidemiological information. If result is POSITIVE SARS-CoV-2 target nucleic acids are DETECTED. The SARS-CoV-2 RNA is generally detectable in upper and lower  respiratory specimens dur ing the acute phase of infection.  Positive  results are indicative of active infection with SARS-CoV-2.  Clinical  correlation with patient history and other diagnostic information is  necessary to determine patient infection status.  Positive results do  not rule out bacterial infection or co-infection with other viruses. If result is PRESUMPTIVE POSTIVE SARS-CoV-2 nucleic acids MAY BE PRESENT.   A presumptive positive result was obtained on the submitted specimen   and confirmed on repeat testing.  While 2019 novel coronavirus  (SARS-CoV-2) nucleic acids may be present in the submitted sample  additional confirmatory testing may be necessary for epidemiological  and / or clinical management purposes  to differentiate between  SARS-CoV-2 and other Sarbecovirus currently known to infect humans.  If clinically indicated additional testing with an alternate test  methodology [phone])  is advised. The SARS-CoV-2 RNA is generally  detectable in upper and lower respiratory sp ecimens during the acute  phase of infection. The expected result is Negative. Fact Sheet for Patients:  StrictlyIdeas.no Fact Sheet for Healthcare Providers: BankingDealers.co.za This test is not yet approved or cleared by the [place] FDA and has been authorized for detection and/or diagnosis of SARS-CoV-2 by FDA under an Emergency Use Authorization (EUA).  This EUA will remain in effect (meaning this test can be used) for the duration of the COVID-19 declaration under Section 564(b)(1) of the Act, 21 U.S.C. section 360bbb-3(b)(1), unless the authorization is terminated or revoked sooner. Performed at [facility], [place] [address], [place], [place] [address]   Culture, Urine     Status: None   Collection Time: 07/14/18 10:43 AM  Result Value Ref Range Status   Specimen Description   Final    URINE, RANDOM Performed at [facility] [address], [place], [place] [address]    Special Requests   Final    NONE Performed at [facility], [place] [address], [place], [place] [address]    Culture   Final    NO GROWTH Performed at [facility], [place] [address], [place], [place] [address]    Report Status 07/15/2018 FINAL  Final     Labs: BNP (last 3 results) Recent Labs    07/13/18 1206  BNP 973.5*   Basic Metabolic Panel: Recent Labs  Lab 07/13/18 1206  07/14/18 0543 07/15/18 0604 07/16/18 0806  NA 141 141 141 141  K 3.4* 2.6* 3.7 3.4*  CL 104 102 105 102  CO2 25 28 28 29   GLUCOSE 131* 115* 120* 123*  BUN 11 14 26* 27*  CREATININE 0.61 0.75 0.98 0.85  CALCIUM 8.8* 8.4* 8.5* 9.1  MG  --  1.5* 2.5*  --    Liver Function Tests: Recent Labs  Lab 07/13/18 1206  AST 23  ALT 16  ALKPHOS 96  BILITOT 1.3*  PROT 7.5  ALBUMIN 3.7   No results for input(s): LIPASE, AMYLASE in the last 168 hours. No results for input(s): AMMONIA in the last 168 hours. CBC: Recent Labs  Lab 07/13/18 1206 07/14/18 0543 07/15/18 0604  WBC 9.7 6.8 6.5  NEUTROABS 7.9*  --   --   HGB 12.5 11.5* 11.2*  HCT 39.0 38.0 36.7  MCV 89.9 91.8 92.2  PLT 135* 147* 148*   Cardiac Enzymes: Recent Labs  Lab 07/13/18 1206  TROPONINI <0.03   BNP: Invalid input(s): POCBNP CBG: Recent Labs  Lab 07/15/18 0752 07/15/18 1146 07/15/18 1628 07/15/18 2149 07/16/18 0722  GLUCAP 110* 111* 112* 116* 108*   D-Dimer No results for input(s): DDIMER in the last 72 hours. Hgb A1c Recent Labs    07/15/18 0604  HGBA1C 5.2   Lipid Profile No results for input(s): CHOL, HDL, LDLCALC, TRIG, CHOLHDL, LDLDIRECT in the last 72 hours. Thyroid function studies No results for input(s): TSH, T4TOTAL, T3FREE, THYROIDAB in the last 72 hours.  Invalid input(s): FREET3 Anemia work up No results for input(s): VITAMINB12, FOLATE, FERRITIN, TIBC, IRON, RETICCTPCT in the last 72 hours. Urinalysis    Component Value Date/Time   COLORURINE YELLOW 07/14/2018 1043   APPEARANCEUR CLEAR 07/14/2018 1043   LABSPEC 1.011 07/14/2018 1043   PHURINE 5.0 07/14/2018 1043   GLUCOSEU NEGATIVE 07/14/2018 1043   HGBUR SMALL (A) 07/14/2018 1043   BILIRUBINUR NEGATIVE 07/14/2018 1043   KETONESUR NEGATIVE 07/14/2018 1043  PROTEINUR NEGATIVE 07/14/2018 1043   UROBILINOGEN 0.2 04/02/2013 0554   NITRITE NEGATIVE 07/14/2018 1043   LEUKOCYTESUR NEGATIVE 07/14/2018 1043   Sepsis  Labs Invalid input(s): PROCALCITONIN,  WBC,  LACTICIDVEN Microbiology Recent Results (from the past 240 hour(s))  SARS Coronavirus 2 (CEPHEID- Performed in [place] hospital lab), Hosp Order     Status: None   Collection Time: 07/13/18 11:06 AM  Result Value Ref Range Status   SARS Coronavirus 2 NEGATIVE NEGATIVE Final    Comment: (NOTE) If result is NEGATIVE SARS-CoV-2 target nucleic acids are NOT DETECTED. The SARS-CoV-2 RNA is generally detectable in upper and lower  respiratory specimens during the acute phase of infection. The lowest  concentration of SARS-CoV-2 viral copies this assay can detect is 250  copies / mL. A negative result does not preclude SARS-CoV-2 infection  and should not be used as the sole basis for treatment or other  patient management decisions.  A negative result may occur with  improper specimen collection / handling, submission of specimen other  than nasopharyngeal swab, presence of viral mutation(s) within the  areas targeted by this assay, and inadequate number of viral copies  (<250 copies / mL). A negative result must be combined with clinical  observations, patient history, and epidemiological information. If result is POSITIVE SARS-CoV-2 target nucleic acids are DETECTED. The SARS-CoV-2 RNA is generally detectable in upper and lower  respiratory specimens dur ing the acute phase of infection.  Positive  results are indicative of active infection with SARS-CoV-2.  Clinical  correlation with patient history and other diagnostic information is  necessary to determine patient infection status.  Positive results do  not rule out bacterial infection or co-infection with other viruses. If result is PRESUMPTIVE POSTIVE SARS-CoV-2 nucleic acids MAY BE PRESENT.   A presumptive positive result was obtained on the submitted specimen  and confirmed on repeat testing.  While 2019 novel coronavirus  (SARS-CoV-2) nucleic acids may be present in the submitted  sample  additional confirmatory testing may be necessary for epidemiological  and / or clinical management purposes  to differentiate between  SARS-CoV-2 and other Sarbecovirus currently known to infect humans.  If clinically indicated additional testing with an alternate test  methodology [phone]) is advised. The SARS-CoV-2 RNA is generally  detectable in upper and lower respiratory sp ecimens during the acute  phase of infection. The expected result is Negative. Fact Sheet for Patients:  StrictlyIdeas.no Fact Sheet for Healthcare Providers: BankingDealers.co.za This test is not yet approved or cleared by the [place] FDA and has been authorized for detection and/or diagnosis of SARS-CoV-2 by FDA under an Emergency Use Authorization (EUA).  This EUA will remain in effect (meaning this test can be used) for the duration of the COVID-19 declaration under Section 564(b)(1) of the Act, 21 U.S.C. section 360bbb-3(b)(1), unless the authorization is terminated or revoked sooner. Performed at [facility], [place] [address], [place], [place] [address]   Culture, Urine     Status: None   Collection Time: 07/14/18 10:43 AM  Result Value Ref Range Status   Specimen Description   Final    URINE, RANDOM Performed at [facility] [address], [place], [place] [address]    Special Requests   Final    NONE Performed at [facility], [place] [address], [place], [place] [address]    Culture   Final    NO GROWTH Performed at [facility], [place]  [address], [place], [place] [address]    Report Status 07/15/2018 FINAL  Final     Time coordinating discharge: 45 minutes  SIGNED:   Tawni Millers, MD  [facility] 07/16/2018, 9:56 AM

## 2018-07-21 ENCOUNTER — Other Ambulatory Visit: Payer: Self-pay

## 2018-07-21 ENCOUNTER — Ambulatory Visit (INDEPENDENT_AMBULATORY_CARE_PROVIDER_SITE_OTHER): Payer: Medicare Other | Admitting: Podiatry

## 2018-07-21 ENCOUNTER — Encounter: Payer: Self-pay | Admitting: Podiatry

## 2018-07-21 VITALS — Temp 97.7°F

## 2018-07-21 DIAGNOSIS — L97529 Non-pressure chronic ulcer of other part of left foot with unspecified severity: Secondary | ICD-10-CM | POA: Diagnosis not present

## 2018-07-21 DIAGNOSIS — B351 Tinea unguium: Secondary | ICD-10-CM

## 2018-07-21 DIAGNOSIS — M79674 Pain in right toe(s): Secondary | ICD-10-CM

## 2018-07-21 DIAGNOSIS — I6523 Occlusion and stenosis of bilateral carotid arteries: Secondary | ICD-10-CM | POA: Diagnosis not present

## 2018-07-21 DIAGNOSIS — M79675 Pain in left toe(s): Secondary | ICD-10-CM | POA: Diagnosis not present

## 2018-07-23 DIAGNOSIS — I10 Essential (primary) hypertension: Secondary | ICD-10-CM | POA: Diagnosis not present

## 2018-07-23 DIAGNOSIS — Z7901 Long term (current) use of anticoagulants: Secondary | ICD-10-CM | POA: Diagnosis not present

## 2018-07-23 DIAGNOSIS — I4891 Unspecified atrial fibrillation: Secondary | ICD-10-CM | POA: Diagnosis not present

## 2018-07-23 DIAGNOSIS — I5032 Chronic diastolic (congestive) heart failure: Secondary | ICD-10-CM | POA: Diagnosis not present

## 2018-07-23 LAB — PROTIME-INR: INR: 3.4 — AB (ref <=1.1)

## 2018-07-24 ENCOUNTER — Ambulatory Visit: Payer: Self-pay | Admitting: Pharmacist Clinician (PhC)/ Clinical Pharmacy Specialist

## 2018-07-24 DIAGNOSIS — I4891 Unspecified atrial fibrillation: Secondary | ICD-10-CM

## 2018-07-26 NOTE — Progress Notes (Signed)
Subjective: 84 y.o. returns the office today for painful, elongated, thickened toenails which she cannot trim herself. Denies any redness or drainage around the nails.  She also presents today for follow-up evaluation of a callus on the ball of the left foot.  She is had some drainage come from the area but denies any pus.  No swelling or redness.  The calluses become very thick.  Denies any acute changes since last appointment and no new complaints today. Denies any systemic complaints such as fevers, chills, nausea, vomiting.   PCP: Bonnie Pretty, MD  Objective: AAO 3, NAD DP/PT pulses palpable, CRT less than 3 seconds Protective sensation decreased with Simms Weinstein monofilament Nails hypertrophic, dystrophic, elongated, brittle, discolored 10. There is tenderness overlying the nails 1-5 bilaterally. There is no surrounding erythema or drainage along the nail sites. Keratotic lesion left foot submetatarsal 1.  Upon debridement there is a superficial area of skin breakdown, ulcer present measuring 0.3 x 0.3 cm.  Granular wound base is present.  No probing, undermining or tunneling.  No streaking, ascending cellulitis.  No fluctuation crepitation noted. No open lesions or pre-ulcerative lesions are identified. No other areas of tenderness bilateral lower extremities. No overlying edema, erythema, increased warmth. No pain with calf compression, swelling, warmth, erythema.  Assessment: Patient presents with symptomatic onychomycosis; wound left foot  Plan: -Treatment options including alternatives, risks, complications were discussed -Nails sharply debrided 10 without complication/bleeding. -Debrided the hyperkeratotic tissue today to reveal the underlying ulcer.  Continue antibiotic ointment dressing changes daily offloading.  Monitor for any signs or symptoms of infection. -Discussed daily foot inspection. If there are any changes, to call the office immediately.  -Follow-up as  scheduled or sooner if any problems are to arise. In the meantime, encouraged to call the office with any questions, concerns, changes symptoms.  [name], DPM

## 2018-07-27 ENCOUNTER — Other Ambulatory Visit: Payer: Self-pay

## 2018-07-27 DIAGNOSIS — I6521 Occlusion and stenosis of right carotid artery: Secondary | ICD-10-CM

## 2018-07-27 DIAGNOSIS — I6522 Occlusion and stenosis of left carotid artery: Secondary | ICD-10-CM

## 2018-07-29 ENCOUNTER — Telehealth (HOSPITAL_COMMUNITY): Payer: Self-pay

## 2018-07-29 DIAGNOSIS — M5410 Radiculopathy, site unspecified: Secondary | ICD-10-CM | POA: Diagnosis not present

## 2018-07-29 NOTE — Telephone Encounter (Signed)
The above patient or their representative was contacted and gave the following answers to these questions:         Do you have any of the following symptoms? No  Fever                    Cough                   Shortness of breath  Do  you have any of the following other symptoms? No   muscle pain         vomiting,        diarrhea        rash         weakness        red eye        abdominal pain         bruising          bruising or bleeding              joint pain           severe headache    Have you been in contact with someone who was or has been sick in the past 2 weeks? No  Yes                 Unsure                         Unable to assess   Does the person that you were in contact with have any of the following symptoms?   Cough         shortness of breath           muscle pain         vomiting,            diarrhea            rash            weakness           fever            red eye           abdominal pain           bruising  or  bleeding                joint pain                severe headache               Have you  or someone you have been in contact with traveled internationally in th last month? No        If yes, which countries?   Have you  or someone you have been in contact with traveled outside [place] in th last month? No         If yes, which state and city?   COMMENTS OR ACTION PLAN FOR THIS PATIENT:          

## 2018-07-30 ENCOUNTER — Encounter: Payer: Self-pay | Admitting: Family

## 2018-07-30 ENCOUNTER — Ambulatory Visit (INDEPENDENT_AMBULATORY_CARE_PROVIDER_SITE_OTHER): Payer: Medicare Other | Admitting: Family

## 2018-07-30 ENCOUNTER — Other Ambulatory Visit: Payer: Self-pay

## 2018-07-30 ENCOUNTER — Telehealth: Payer: Self-pay

## 2018-07-30 ENCOUNTER — Ambulatory Visit (HOSPITAL_COMMUNITY)
Admission: RE | Admit: 2018-07-30 | Discharge: 2018-07-30 | Disposition: A | Discharge status: Home / Self Care | Payer: Medicare Other | Source: Ambulatory Visit | Attending: Family | Admitting: Family

## 2018-07-30 VITALS — BP 129/78 | HR 98 | Ht 62.0 in | Wt 205.0 lb

## 2018-07-30 DIAGNOSIS — I6522 Occlusion and stenosis of left carotid artery: Secondary | ICD-10-CM | POA: Diagnosis not present

## 2018-07-30 DIAGNOSIS — I6521 Occlusion and stenosis of right carotid artery: Secondary | ICD-10-CM | POA: Diagnosis not present

## 2018-07-30 DIAGNOSIS — G894 Chronic pain syndrome: Secondary | ICD-10-CM | POA: Diagnosis not present

## 2018-07-30 NOTE — Telephone Encounter (Signed)

## 2018-07-30 NOTE — Progress Notes (Signed)
Virtual Visit via Telephone Note   I connected with [name] on 07/30/2018 using the [employer] by telephone and verified that I was speaking with the correct person using two identifiers. Patient was located at her home and accompanied by herself. I am located at the [facility] office on [facility], [place].   The limitations of evaluation and management by telemedicine and the availability of in person appointments have been previously discussed with the patient and are documented in the patients chart. The patient expressed understanding and consented to proceed.  PCP: Deland Pretty, MD   Chief Complaint: Follow up Extracranial Carotid Artery Stenosis   History of Present Illness: [name] is a 84 y.o. female whomDr. Ronnie Derby monitoredfor left-sided carotid artery stenosis with a known right ICA occlusion.  She has not had previous carotid artery intervention.  She denies any remote or recent stroke of TIA symptoms.  She denies steal type symptoms in her upper extremities.  Does not have claudication type symptoms.  She tires easily since she had the PE, also has CHF.   She is a retired Therapist, sports.   She has mild neuropathy in both feet for years, attributes to back issues.  She had detatched retina repaired Dec. 2, 2014, had cataract surgery July, 2015, can see much better, no more headaches. She has Crohn's Disease and was evaluted for occult GI bleed, is anemic, states her Hg is increasing with oral iron. April, 2014 she had a left total shoulder replaced, then developed PE's, she now takes coumadin.  Her right shoulder has issues.  She has had lumbar spine surgery.  She has severe generalized OA, has affected her feet, has trouble walking. She was receiving physical therapy, is doing some walking. Is also getting some treatments to her feet (OA pain) that helps with pain and is able to walk more.   She also has fibromyalgia, states she is never without pain: in her  left foot since an injury years ago (non operable), and generalized pain from OA and fibromyalgia. The pain is less when she gets warm in bed.   She sees Dr. Nelva Bush for pain management, takes hydrocodone   She states she has proven white coat syndrome.   She had an exacerbation of her CHF on 07-13-18, was admitted to [facility] for 3 days. She is feeling much improved, was negative for COVID 19. Her diuretic and KCL was increased for 3 days.  She will be receiving some portable O2, as she desaturates with walking.  She had her left foot injected 04/24/15 with a nerve block to help pain. She developed a severe URI early in 2017 with a relapse, had PT to help with weakness.  She exercises daily.   She fell after seeing me on 05-06-17, saw Dr. Ricki Rodriguez. She then had some home PT to help her improve her walking and balance.   Diabetic: no Tobacco use: former smoker, quitin 1988, smoked x 15 years, 1/2 ppd   Pt meds include: Statin : no, cannot tolerate  ASA: no Other anticoagulants/antiplatelets: coumadinfor atrial fib and hx of PE post shoulder surgery     Past Medical History:  Diagnosis Date  . Atrial fibrillation ([place])   . Carotid artery occlusion   . Chronic diastolic CHF (congestive heart failure) (HCC)    Hypertensive heart disease 02-12-14  . Crohn's disease ([place])   . Detached retina   . Fibromyalgia   . GERD (gastroesophageal reflux disease)   . Gout   .  H/O hiatal hernia   . High triglycerides   . History of stomach ulcers 1950's  . Hypertension   . Long term current use of anticoagulant   . Obstructive sleep apnea on CPAP   . Osteoarthritis    PAIN AND OA LEF T HIP AND BOTH SHOULDERS ARE BONE ON BONE AND PAINFUL  . Peripheral vascular disease (HCC)    KNOWN RIGHT INTERNAL CAROTID ARTERY OCCLUSION (NO STROKE)  --40 TO 59% STENOSIS LEFT ICA-FOLLOWED BY DR. EARLY WITH DOPPLER STUDY EVERY 6 MONTHS  . Pulmonary embolism ([place]) 2011   a. Hx of PE in 02/2009 after R hip surgery,  venous dopplers negative, long-term Coumadin.  [name] Kitchen PVC (premature ventricular contraction)    PT STATES HX OF PVC'S ON EKG  . Recurrent upper respiratory infection (URI)    BRONCHITIS FEB 2013--SLIGHT COUGH NON-PRODUCTIVE NOW  . Recurrent UTI (urinary tract infection)    "on daily medicine" (05/14/2012)  . Tinnitus   . Vertigo     Past Surgical History:  Procedure Laterality Date  . APPENDECTOMY  1950's  . BACK SURGERY  10/29/06   Central and foraminal decompression L3-L4, L4-L5, and L5-S1 with inspection of L4-L5 and L5-S1 disc on the right  . CARDIAC CATHETERIZATION  1970's   "maybe 2" (05/14/2012)  . CARDIAC CATHETERIZATION    . CATARACT EXTRACTION W/ INTRAOCULAR LENS IMPLANT Right 2009  . CHOLECYSTECTOMY  ~ 1988  . DECOMPRESSIVE LUMBAR LAMINECTOMY LEVEL 3  ~ 2002  . DILATION AND CURETTAGE OF UTERUS     "several; from miscarriages" (05/14/2012)  . EXCISION MORTON'S NEUROMA Right 05/20/00   "foot" (05/14/2012)  . EYE SURGERY Left 2014   Detached retina  . EYE SURGERY Left August 23, 2013   Cataract  . FEMUR FRACTURE SURGERY Right 02/21/09   Open reduction internal fixation of right periprosthetic  femur fracture utilizing Zimmer cables times fiv  . FRACTURE SURGERY  2011   Right Femur Fx  . HAMMER TOE SURGERY Left 10/25/08   "toe next to big to" (05/14/2012)  . JOINT REPLACEMENT  2009   Right Hip replacement  . JOINT REPLACEMENT  2012   Right knee replacement  . JOINT REPLACEMENT  06/17/11   Left Hip replacement  . KNEE ARTHROSCOPY Right 07/26/05  . REPLACEMENT TOTAL KNEE Right 02/19/2010  . SPINE SURGERY    . [place]?  . TOTAL HIP ARTHROPLASTY Right 12/08/02   Osteonics total hip replacement  . TOTAL HIP ARTHROPLASTY  06/17/2011   Procedure: TOTAL HIP ARTHROPLASTY;  Surgeon: Gearlean Alf, MD;  Location: [facility];  Service: Orthopedics;  Laterality: Left;  . TOTAL SHOULDER ARTHROPLASTY Left 05/14/2012  . TOTAL SHOULDER ARTHROPLASTY Left 05/14/2012   Procedure:  TOTAL SHOULDER ARTHROPLASTY;  Surgeon: Marin Shutter, MD;  Location: [facility];  Service: Orthopedics;  Laterality: Left;  [name] Kitchen VAGINAL HYSTERECTOMY  1971    Current Meds  Medication Sig  . allopurinol (ZYLOPRIM) 300 MG tablet Take 300 mg by mouth daily.  [name] Kitchen amLODipine (NORVASC) 5 MG tablet Take 5 mg by mouth daily.  . brimonidine (ALPHAGAN) 0.2 % ophthalmic solution 1 drop 2 (two) times daily.   . celecoxib (CELEBREX) 100 MG capsule Take 100 mg by mouth daily.   . cetirizine (ZYRTEC) 10 MG tablet Take 10 mg by mouth daily.  . cimetidine (TAGAMET) 200 MG tablet Take 200 mg by mouth 2 (two) times daily.  [name] Kitchen CRANBERRY CONCENTRATE PO Take 1 tablet by mouth daily.  . dorzolamide-timolol (  COSOPT) 22.3-6.8 MG/ML ophthalmic solution Place 1 drop into both eyes 2 (two) times daily.   . furosemide (LASIX) 20 MG tablet Take 1 tablet (20 mg total) by mouth daily for 30 days.  [name] Kitchen HYDROcodone-acetaminophen (NORCO/VICODIN) 5-325 MG tablet Take 0.5-1 tablets by mouth 3 (three) times daily as needed for pain.   [name] Kitchen ipratropium (ATROVENT) 0.06 % nasal spray Place 1 spray into both nostrils daily as needed (allergies).   [name] Kitchen LUMIGAN 0.01 % SOLN Place 1 drop into both eyes at bedtime.  . mupirocin ointment (BACTROBAN) 2 % Apply 1 application topically 2 (two) times daily.  . nebivolol (BYSTOLIC) 10 MG tablet Take 1 tablet (10 mg total) by mouth at bedtime.  . potassium chloride (K-DUR) 10 MEQ tablet Take 2 tablets (20 mEq total) by mouth daily for 30 days. MWF  . silver sulfADIAZINE (SILVADENE) 1 % cream Apply 1 application topically daily.  . traZODone (DESYREL) 50 MG tablet Take 25 mg by mouth at bedtime.   . triamcinolone (NASACORT ALLERGY 24HR) 55 MCG/ACT AERO nasal inhaler Place 2 sprays into the nose daily as needed (allergies).   . warfarin (COUMADIN) 2.5 MG tablet TAKE 1 TO 1&1/2 TABLETS DAILY AS DIRECTED BY COUMADIN CLINIC. (Patient taking differently: Take 2.5-3.75 mg by mouth as directed. TAKE 1.5 tablets (3.75  mg) on (MWF) & Take 1 tablet (2.5 mg) all other days.)    12 system ROS was negative unless otherwise noted in HPI   Observations/Objective:  Carotid Duplex (07-30-18): Right Carotid: Evidence consistent with a total occlusion of the right ICA. Left Carotid: Velocities in the left ICA are consistent with a 60-79% stenosis.               Non-hemodynamically significant plaque noted in the CCA. The ECA               appears >50% stenosed. Vertebrals:  Bilateral vertebral arteries demonstrate antegrade flow. Subclavians: Normal flow hemodynamics were seen in bilateral subclavian              arteries. No change compared to the exams on 05-06-17 and 11-11-17.     Vitals:   07/30/18 1502  BP: 129/78  Pulse: 98  SpO2: 93%  Weight: 205 lb (93 kg)  Height: 5' 2"  (1.575 m)   Body mass index is 37.49 kg/m.    Assessment and Plan: [name] is a 84 y.o. female whois followed for left internal carotid artery stenosis with a known right ICA occlusion. She has no history of stroke or TIA.  She takes coumadin for hx of PE and atrial fib. She is statin intolerant, is morbidly obese. She smoked x 15 years, quit in 1988, does not have a diagnosis of DM   #1) Extracranial carotid artery stenosis:  remains stable with no neurological events.  #2) Chronic pain: managed by Dr. Nelva Bush  #3) Limited mobility: secondary to OA and fibromyalgia pain, which facilitates morbid obesity. Has received physical therapy several times #4) CHF, recent exacerbation and hospitalization, feels much improved. She desaturates quickly with walking, will be receiving portable home O2 in order to be more active    Follow Up Instructions:   Follow up 6 months with carotid duplex.    I discussed the assessment and treatment plan with the patient. The patient was provided an opportunity to ask questions and all were answered. The patient agreed with the plan and demonstrated an understanding of the  instructions.   The patient was advised to  call back or seek an in-person evaluation if the symptoms worsen or if the condition fails to improve as anticipated.  I spent 10 minutes with the patient via telephone encounter.   Gabrielle Dare Rufus Cypert [facility] Office: [phone]  07/30/2018, 4:00 PM

## 2018-08-04 ENCOUNTER — Encounter: Payer: Self-pay | Admitting: Podiatry

## 2018-08-04 ENCOUNTER — Ambulatory Visit (INDEPENDENT_AMBULATORY_CARE_PROVIDER_SITE_OTHER): Payer: Medicare Other | Admitting: Podiatry

## 2018-08-04 ENCOUNTER — Other Ambulatory Visit: Payer: Self-pay

## 2018-08-04 VITALS — Temp 97.6°F

## 2018-08-04 DIAGNOSIS — I6523 Occlusion and stenosis of bilateral carotid arteries: Secondary | ICD-10-CM | POA: Diagnosis not present

## 2018-08-04 DIAGNOSIS — L97529 Non-pressure chronic ulcer of other part of left foot with unspecified severity: Secondary | ICD-10-CM | POA: Diagnosis not present

## 2018-08-04 NOTE — Progress Notes (Signed)
Subjective: 84 y.o. returns the office today for follow-up evaluation of a wound to the follow-up of her left foot.  She said that she had very minimal drainage but overall doing better.  Denies any pus.  Denies any surrounding redness or red streaks.  No swelling.  No pain.  She is been keeping medihoney on the wound daily.Denies any systemic complaints such as fevers, chills, nausea, vomiting.   PCP: Deland Pretty, MD  Objective: AAO 3, NAD DP/PT pulses palpable, CRT less than 3 seconds Protective sensation decreased with Derrel Nip monofilament Hyperkeratotic lesion left foot submetatarsal 1.  Upon debridement there is a superficial area of skin breakdown, ulcer present measuring 0.2 x 0.1 cm and appears to be almost healed and is superficial.  No probing, undermining or tunneling.  No streaking, ascending cellulitis.  No fluctuation crepitation noted. No open lesions or pre-ulcerative lesions are identified. No other areas of tenderness bilateral lower extremities. No overlying edema, erythema, increased warmth. No pain with calf compression, swelling, warmth, erythema.  Assessment: Patient presents for follow up of wound left foot, healing  Plan: -Treatment options including alternatives, risks, complications were discussed -Debrided the hyperkeratotic tissue today to reveal the underlying ulcer.  Overall the wound is improving.  Would continue with medihoney dressing changes daily.  Offloading.  -Monitor for any clinical signs or symptoms of infection and directed to call the office immediately should any occur or go to the ER.  Return in about 3 weeks (around 08/25/2018).  Trula Slade DPM

## 2018-08-06 ENCOUNTER — Other Ambulatory Visit: Payer: Self-pay

## 2018-08-06 ENCOUNTER — Ambulatory Visit (INDEPENDENT_AMBULATORY_CARE_PROVIDER_SITE_OTHER): Payer: Medicare Other | Admitting: Pharmacist

## 2018-08-06 DIAGNOSIS — I4891 Unspecified atrial fibrillation: Secondary | ICD-10-CM | POA: Diagnosis not present

## 2018-08-06 LAB — POCT INR: INR: 2.2 (ref 2.0–3.0)

## 2018-08-11 DIAGNOSIS — Z961 Presence of intraocular lens: Secondary | ICD-10-CM | POA: Diagnosis not present

## 2018-08-11 DIAGNOSIS — H33052 Total retinal detachment, left eye: Secondary | ICD-10-CM | POA: Diagnosis not present

## 2018-08-11 DIAGNOSIS — H401112 Primary open-angle glaucoma, right eye, moderate stage: Secondary | ICD-10-CM | POA: Diagnosis not present

## 2018-08-11 DIAGNOSIS — S0511XS Contusion of eyeball and orbital tissues, right eye, sequela: Secondary | ICD-10-CM | POA: Diagnosis not present

## 2018-08-11 DIAGNOSIS — H01023 Squamous blepharitis right eye, unspecified eyelid: Secondary | ICD-10-CM | POA: Diagnosis not present

## 2018-08-11 DIAGNOSIS — H40052 Ocular hypertension, left eye: Secondary | ICD-10-CM | POA: Diagnosis not present

## 2018-08-11 DIAGNOSIS — H01026 Squamous blepharitis left eye, unspecified eyelid: Secondary | ICD-10-CM | POA: Diagnosis not present

## 2018-08-25 ENCOUNTER — Ambulatory Visit: Payer: Medicare Other | Admitting: Podiatry

## 2018-08-31 ENCOUNTER — Ambulatory Visit: Payer: Medicare Other | Admitting: Podiatry

## 2018-08-31 ENCOUNTER — Encounter

## 2018-09-06 ENCOUNTER — Other Ambulatory Visit: Payer: Self-pay | Admitting: Cardiology

## 2018-09-08 ENCOUNTER — Emergency Department (HOSPITAL_BASED_OUTPATIENT_CLINIC_OR_DEPARTMENT_OTHER): Payer: Medicare Other

## 2018-09-08 ENCOUNTER — Encounter: Payer: Self-pay | Admitting: Podiatry

## 2018-09-08 ENCOUNTER — Ambulatory Visit (INDEPENDENT_AMBULATORY_CARE_PROVIDER_SITE_OTHER): Payer: Medicare Other | Admitting: Podiatry

## 2018-09-08 ENCOUNTER — Other Ambulatory Visit: Payer: Self-pay

## 2018-09-08 ENCOUNTER — Emergency Department (HOSPITAL_BASED_OUTPATIENT_CLINIC_OR_DEPARTMENT_OTHER)
Admission: EM | Admit: 2018-09-08 | Discharge: 2018-09-08 | Disposition: A | Discharge status: Home / Self Care | Payer: Medicare Other | Attending: Emergency Medicine | Admitting: Emergency Medicine

## 2018-09-08 ENCOUNTER — Encounter (HOSPITAL_BASED_OUTPATIENT_CLINIC_OR_DEPARTMENT_OTHER): Payer: Self-pay | Admitting: *Deleted

## 2018-09-08 VITALS — Temp 97.9°F

## 2018-09-08 DIAGNOSIS — Z87891 Personal history of nicotine dependence: Secondary | ICD-10-CM | POA: Insufficient documentation

## 2018-09-08 DIAGNOSIS — Z7901 Long term (current) use of anticoagulants: Secondary | ICD-10-CM | POA: Diagnosis not present

## 2018-09-08 DIAGNOSIS — Z96612 Presence of left artificial shoulder joint: Secondary | ICD-10-CM | POA: Diagnosis not present

## 2018-09-08 DIAGNOSIS — E876 Hypokalemia: Secondary | ICD-10-CM | POA: Diagnosis not present

## 2018-09-08 DIAGNOSIS — G609 Hereditary and idiopathic neuropathy, unspecified: Secondary | ICD-10-CM

## 2018-09-08 DIAGNOSIS — Z96651 Presence of right artificial knee joint: Secondary | ICD-10-CM | POA: Diagnosis not present

## 2018-09-08 DIAGNOSIS — I110 Hypertensive heart disease with heart failure: Secondary | ICD-10-CM | POA: Insufficient documentation

## 2018-09-08 DIAGNOSIS — R0602 Shortness of breath: Secondary | ICD-10-CM | POA: Diagnosis not present

## 2018-09-08 DIAGNOSIS — I4891 Unspecified atrial fibrillation: Secondary | ICD-10-CM | POA: Insufficient documentation

## 2018-09-08 DIAGNOSIS — L97529 Non-pressure chronic ulcer of other part of left foot with unspecified severity: Secondary | ICD-10-CM

## 2018-09-08 DIAGNOSIS — R9431 Abnormal electrocardiogram [ECG] [EKG]: Secondary | ICD-10-CM | POA: Diagnosis not present

## 2018-09-08 DIAGNOSIS — Z96643 Presence of artificial hip joint, bilateral: Secondary | ICD-10-CM | POA: Diagnosis not present

## 2018-09-08 DIAGNOSIS — Z79899 Other long term (current) drug therapy: Secondary | ICD-10-CM | POA: Insufficient documentation

## 2018-09-08 DIAGNOSIS — I5032 Chronic diastolic (congestive) heart failure: Secondary | ICD-10-CM | POA: Insufficient documentation

## 2018-09-08 DIAGNOSIS — R7989 Other specified abnormal findings of blood chemistry: Secondary | ICD-10-CM | POA: Diagnosis present

## 2018-09-08 DIAGNOSIS — I10 Essential (primary) hypertension: Secondary | ICD-10-CM | POA: Diagnosis not present

## 2018-09-08 LAB — TROPONIN I (HIGH SENSITIVITY)
Troponin I (High Sensitivity): 6 ng/L (ref <18)
Troponin I (High Sensitivity): 7 ng/L (ref <18)

## 2018-09-08 LAB — URINALYSIS, ROUTINE W REFLEX MICROSCOPIC
Bilirubin Urine: NEGATIVE
Glucose, UA: NEGATIVE mg/dL
Ketones, ur: NEGATIVE mg/dL
Nitrite: NEGATIVE
Protein, ur: 30 mg/dL — AB
Specific Gravity, Urine: 1.01 (ref 1.005–1.030)
pH: 6 (ref 5.0–8.0)

## 2018-09-08 LAB — COMPREHENSIVE METABOLIC PANEL
ALT: 12 U/L (ref 0–44)
AST: 18 U/L (ref 15–41)
Albumin: 3.7 g/dL (ref 3.5–5.0)
Alkaline Phosphatase: 80 U/L (ref 38–126)
Anion gap: 11 (ref 5–15)
BUN: 12 mg/dL (ref 8–23)
CO2: 26 mmol/L (ref 22–32)
Calcium: 7.8 mg/dL — ABNORMAL LOW (ref 8.9–10.3)
Chloride: 104 mmol/L (ref 98–111)
Creatinine, Ser: 0.78 mg/dL (ref 0.44–1.00)
GFR calc Af Amer: >60 mL/min (ref >60)
GFR calc non Af Amer: >60 mL/min (ref >60)
Glucose, Bld: 120 mg/dL — ABNORMAL HIGH (ref 70–99)
Potassium: 2.8 mmol/L — ABNORMAL LOW (ref 3.5–5.1)
Sodium: 141 mmol/L (ref 135–145)
Total Bilirubin: 0.7 mg/dL (ref 0.3–1.2)
Total Protein: 6.9 g/dL (ref 6.5–8.1)

## 2018-09-08 LAB — CBC WITH DIFFERENTIAL/PLATELET
Abs Immature Granulocytes: 0.08 10*3/uL — ABNORMAL HIGH (ref 0.00–0.07)
Basophils Absolute: 0 10*3/uL (ref 0.0–0.1)
Basophils Relative: 1 %
Eosinophils Absolute: 0.2 10*3/uL (ref 0.0–0.5)
Eosinophils Relative: 3 %
HCT: 39.8 % (ref 36.0–46.0)
Hemoglobin: 12.8 g/dL (ref 12.0–15.0)
Immature Granulocytes: 1 %
Lymphocytes Relative: 18 %
Lymphs Abs: 1 10*3/uL (ref 0.7–4.0)
MCH: 28.5 pg (ref 26.0–34.0)
MCHC: 32.2 g/dL (ref 30.0–36.0)
MCV: 88.6 fL (ref 80.0–100.0)
Monocytes Absolute: 0.4 10*3/uL (ref 0.1–1.0)
Monocytes Relative: 8 %
Neutro Abs: 3.9 10*3/uL (ref 1.7–7.7)
Neutrophils Relative %: 69 %
Platelets: 148 10*3/uL — ABNORMAL LOW (ref 150–400)
RBC: 4.49 MIL/uL (ref 3.87–5.11)
RDW: 15.7 % — ABNORMAL HIGH (ref 11.5–15.5)
WBC: 5.7 10*3/uL (ref 4.0–10.5)
nRBC: 0 % (ref 0.0–0.2)

## 2018-09-08 LAB — URINALYSIS, MICROSCOPIC (REFLEX)

## 2018-09-08 LAB — MAGNESIUM: Magnesium: 1.3 mg/dL — ABNORMAL LOW (ref 1.7–2.4)

## 2018-09-08 MED ORDER — POTASSIUM CHLORIDE CRYS ER 20 MEQ PO TBCR
40.0000 meq | EXTENDED_RELEASE_TABLET | Freq: Once | ORAL | Status: AC
  Administered 2018-09-08: 18:00:00 40 meq via ORAL
  Filled 2018-09-08: qty 2

## 2018-09-08 MED ORDER — POTASSIUM CHLORIDE 10 MEQ/100ML IV SOLN
10.0000 meq | Freq: Once | INTRAVENOUS | Status: AC
  Administered 2018-09-08: 10 meq via INTRAVENOUS
  Filled 2018-09-08: qty 100

## 2018-09-08 MED ORDER — MAGNESIUM SULFATE 2 GM/50ML IV SOLN
2.0000 g | Freq: Once | INTRAVENOUS | Status: AC
  Administered 2018-09-08: 18:00:00 2 g via INTRAVENOUS
  Filled 2018-09-08: qty 50

## 2018-09-08 NOTE — ED Notes (Signed)
Patient verbalizes understanding of discharge instructions. Opportunity for questioning and answers were provided. Armband removed by staff, pt discharged from ED.  

## 2018-09-08 NOTE — ED Triage Notes (Signed)
States she was sent here due to abnormal magnesium and potassium.

## 2018-09-08 NOTE — Discharge Instructions (Addendum)
In general rich sources of magnesium are greens, nuts, seeds, dry beans, whole grains and low-fat dairy products. The recommended dietary allowance for magnesium for adult men is 400-433m per day. The dietary allowance for adult women is 310-320 mg per day.  Increase potassium to 20 mEq per day until you are seen by Dr. PShelia Media

## 2018-09-08 NOTE — ED Provider Notes (Signed)
[facility] EMERGENCY DEPARTMENT Provider Note   CSN: [id number] Arrival date & time: 09/08/18  1644    History   Chief Complaint Chief Complaint  Patient presents with  . Abnormal Lab    HPI [name] is a 84 y.o. female.     Pt presents to the ED today with low potassium and low magnesium.  The pt had routine labs done at her pcp's office today.  She was called and told that her potassium and magnesium were dangerously low.  The pt has CHF and is on lasix.  She is supposed to take magnesium, but it causes diarrhea, so she has not taken it since Saturday, July 25th.  She takes 10 meq of kdur daily.  Pt was feeling ok until she was told her labs were off.  Now she has a little sob and cp.       Past Medical History:  Diagnosis Date  . Atrial fibrillation ([place])   . Carotid artery occlusion   . Chronic diastolic CHF (congestive heart failure) (HCC)    Hypertensive heart disease 02-12-14  . Crohn's disease ([place])   . Detached retina   . Fibromyalgia   . GERD (gastroesophageal reflux disease)   . Gout   . H/O hiatal hernia   . High triglycerides   . History of stomach ulcers 1950's  . Hypertension   . Long term current use of anticoagulant   . Obstructive sleep apnea on CPAP   . Osteoarthritis    PAIN AND OA LEF T HIP AND BOTH SHOULDERS ARE BONE ON BONE AND PAINFUL  . Peripheral vascular disease (HCC)    KNOWN RIGHT INTERNAL CAROTID ARTERY OCCLUSION (NO STROKE)  --40 TO 59% STENOSIS LEFT ICA-FOLLOWED BY DR. EARLY WITH DOPPLER STUDY EVERY 6 MONTHS  . Pulmonary embolism ([place]) 2011   a. Hx of PE in 02/2009 after R hip surgery, venous dopplers negative, long-term Coumadin.  [name] Kitchen PVC (premature ventricular contraction)    PT STATES HX OF PVC'S ON EKG  . Recurrent upper respiratory infection (URI)    BRONCHITIS FEB 2013--SLIGHT COUGH NON-PRODUCTIVE NOW  . Recurrent UTI (urinary tract infection)    "on daily medicine" (05/14/2012)  . Tinnitus   . Vertigo      Patient Active Problem List   Diagnosis Date Noted  . Acute on chronic diastolic (congestive) heart failure ([place]) 07/14/2018  . Acute on chronic diastolic heart failure ([place]) 07/13/2018  . Lumbosacral spondylosis without myelopathy 09/26/2017  . Chronic low back pain 07/21/2017  . Degeneration of lumbar intervertebral disc 04/21/2017  . Lumbar post-laminectomy syndrome 04/21/2017  . Esophageal dysphagia 10/15/2016  . Gastroesophageal reflux disease without esophagitis 10/15/2016  . Seasonal allergic rhinitis 10/15/2016  . Chronic atrial fibrillation 05/15/2015  . Aftercare following surgery of the circulatory system, [place] 09/15/2013  . Essential hypertension 06/17/2013  . Atrial fibrillation with RVR ([place]) 04/01/2013  . Morbid obesity ([place]) 04/01/2013  . OSA on CPAP 04/01/2013  . Chronic diastolic CHF (congestive heart failure) ([place])   . H/o recurrent pulmonary embolism, on Coumadin 05/24/2012  . Acute on chronic diastolic congestive heart failure ([place]) 05/24/2012  . Normocytic anemia 05/24/2012  . Thrombocytopenia ([place]) 05/24/2012  . Inadequate anticoagulation 05/24/2012  . Elevated brain natriuretic peptide (BNP) level 05/24/2012  . Shoulder arthritis 05/15/2012  . Stress incontinence 01/06/2012  . Chronic cystitis 01/06/2012  . Occlusion and stenosis of carotid artery without mention of cerebral infarction 09/10/2011  . OA (osteoarthritis) of hip 06/17/2011  .  Osteoarthritis 01/08/2011  . Fibromyalgia 08/06/2010  . Dyslipidemia 08/06/2010  . Gout 08/06/2010  . Right carotid artery occlusion 08/06/2010    Past Surgical History:  Procedure Laterality Date  . APPENDECTOMY  1950's  . BACK SURGERY  10/29/06   Central and foraminal decompression L3-L4, L4-L5, and L5-S1 with inspection of L4-L5 and L5-S1 disc on the right  . CARDIAC CATHETERIZATION  1970's   "maybe 2" (05/14/2012)  . CARDIAC CATHETERIZATION    . CATARACT EXTRACTION W/ INTRAOCULAR LENS IMPLANT Right 2009  .  CHOLECYSTECTOMY  ~ 1988  . DECOMPRESSIVE LUMBAR LAMINECTOMY LEVEL 3  ~ 2002  . DILATION AND CURETTAGE OF UTERUS     "several; from miscarriages" (05/14/2012)  . EXCISION MORTON'S NEUROMA Right 05/20/00   "foot" (05/14/2012)  . EYE SURGERY Left 2014   Detached retina  . EYE SURGERY Left August 23, 2013   Cataract  . FEMUR FRACTURE SURGERY Right 02/21/09   Open reduction internal fixation of right periprosthetic  femur fracture utilizing Zimmer cables times fiv  . FRACTURE SURGERY  2011   Right Femur Fx  . HAMMER TOE SURGERY Left 10/25/08   "toe next to big to" (05/14/2012)  . JOINT REPLACEMENT  2009   Right Hip replacement  . JOINT REPLACEMENT  2012   Right knee replacement  . JOINT REPLACEMENT  06/17/11   Left Hip replacement  . KNEE ARTHROSCOPY Right 07/26/05  . REPLACEMENT TOTAL KNEE Right 02/19/2010  . SPINE SURGERY    . [place]?  . TOTAL HIP ARTHROPLASTY Right 12/08/02   Osteonics total hip replacement  . TOTAL HIP ARTHROPLASTY  06/17/2011   Procedure: TOTAL HIP ARTHROPLASTY;  Surgeon: Gearlean Alf, MD;  Location: [facility];  Service: Orthopedics;  Laterality: Left;  . TOTAL SHOULDER ARTHROPLASTY Left 05/14/2012  . TOTAL SHOULDER ARTHROPLASTY Left 05/14/2012   Procedure: TOTAL SHOULDER ARTHROPLASTY;  Surgeon: Marin Shutter, MD;  Location: [facility];  Service: Orthopedics;  Laterality: Left;  [name] Kitchen VAGINAL HYSTERECTOMY  1971     OB History   No obstetric history on file.      Home Medications    Prior to Admission medications   Medication Sig Start Date End Date Taking? Authorizing Provider  allopurinol (ZYLOPRIM) 300 MG tablet Take 300 mg by mouth daily. 06/09/16   [provider]  amLODipine (NORVASC) 5 MG tablet Take 5 mg by mouth daily. 04/24/18   [provider]  brimonidine (ALPHAGAN) 0.2 % ophthalmic solution 1 drop 2 (two) times daily.  03/20/18   [provider]  celecoxib (CELEBREX) 100 MG capsule Take 100 mg by mouth daily.   02/21/18   [provider]  cetirizine (ZYRTEC) 10 MG tablet Take 10 mg by mouth daily.    [provider]  cimetidine (TAGAMET) 200 MG tablet Take 200 mg by mouth 2 (two) times daily.    [provider]  CRANBERRY CONCENTRATE PO Take 1 tablet by mouth daily.    [provider]  dorzolamide-timolol (COSOPT) 22.3-6.8 MG/ML ophthalmic solution Place 1 drop into both eyes 2 (two) times daily.  06/28/13   [provider]  furosemide (LASIX) 20 MG tablet TAKE (1/2) TABLET DAILY. 09/07/18   [place], Peter M, MD  HYDROcodone-acetaminophen (NORCO/VICODIN) 5-325 MG tablet Take 0.5-1 tablets by mouth 3 (three) times daily as needed for pain.  06/10/18   [provider]  ipratropium (ATROVENT) 0.06 % nasal spray Place 1 spray into both nostrils daily as needed (allergies).  04/13/18   [provider]  LUMIGAN 0.01 % SOLN Place 1 drop into both eyes at bedtime. 09/04/16   [provider]  mupirocin ointment (BACTROBAN) 2 % Apply 1 application topically 2 (two) times daily. 04/22/17   Trula Slade, DPM  nebivolol (BYSTOLIC) 10 MG tablet Take 1 tablet (10 mg total) by mouth at bedtime. 05/19/18   [place], Peter M, MD  potassium chloride (K-DUR) 10 MEQ tablet Take 2 tablets (20 mEq total) by mouth daily for 30 days. MWF 07/16/18 08/15/18  Arrien, Jimmy Picket, MD  silver sulfADIAZINE (SILVADENE) 1 % cream Apply 1 application topically daily. 12/12/15   Tuchman, Richard Loletha Grayer, DPM  traZODone (DESYREL) 50 MG tablet Take 25 mg by mouth at bedtime.  08/09/16   [provider]  triamcinolone (NASACORT ALLERGY 24HR) 55 MCG/ACT AERO nasal inhaler Place 2 sprays into the nose daily as needed (allergies).     [provider]  warfarin (COUMADIN) 2.5 MG tablet TAKE 1 TO 1&1/2 TABLETS DAILY AS DIRECTED BY COUMADIN CLINIC. Patient taking differently: Take 2.5-3.75 mg by mouth as directed. TAKE 1.5 tablets (3.75 mg) on (MWF) & Take 1 tablet (2.5 mg)  all other days. 06/09/18   [place], Peter M, MD  amitriptyline (ELAVIL) 10 MG tablet Take 10 mg by mouth at bedtime.   05/07/11  [provider]    Family History Family History  Problem Relation Age of Onset  . Heart disease Father        Heart Disease before age 15  . Heart attack Father 73  . Hypertension Father   . Peripheral vascular disease Father        Right  leg amputation  . Heart disease Mother        AAA  . Stroke Mother   . Aneurysm Mother   . Hypertension Mother   . AAA (abdominal aortic aneurysm) Mother   . Diabetes Paternal Grandfather   . Hypertension Brother   . Heart disease Brother        Heart Disease before age 82  . Hyperlipidemia Brother   . Heart attack Brother   . Hypertension Brother   . Diabetes Son     Social History Social History   Tobacco Use  . Smoking status: Former Smoker    Packs/day: 0.50    Years: 15.00    Pack years: 7.50    Types: Cigarettes    Quit date: 02/11/1986    Years since quitting: 32.5  . Smokeless tobacco: Never Used  Substance Use Topics  . Alcohol use: Yes    Alcohol/week: 0.0 standard drinks    Comment: 05/14/2012 "have 2-3 drinks/yr" maybe  . Drug use: No     Allergies   Codeine, Cymbalta [duloxetine hcl], Diltiazem cd [diltiazem hcl er beads], Duloxetine, Metoprolol, Nortriptyline, Penicillins, Statins, Tetanus toxoid adsorbed, Tetanus toxoids, Ciprofloxacin, Lyrica [pregabalin], and Penicillin g   Review of Systems Review of Systems  Respiratory: Positive for shortness of breath.   Cardiovascular: Positive for chest pain.  All other systems reviewed and are negative.    Physical Exam Updated Vital Signs BP (!) 192/101 (BP Location: Right Arm)   Pulse 90   Temp 98.2 F (36.8 C) (Oral)   Resp 19   Ht 5' 2"  (1.575 m)   Wt 91.6 kg   SpO2 92%   BMI 36.95 kg/m   Physical Exam Vitals signs and nursing note reviewed.  Constitutional:      Appearance: Normal appearance. She is  obese.  HENT:      Head: Normocephalic and atraumatic.     Right Ear: External ear normal.     Left Ear: External ear normal.     Nose: Nose normal.     Mouth/Throat:     Mouth: Mucous membranes are moist.     Pharynx: Oropharynx is clear.  Eyes:     Extraocular Movements: Extraocular movements intact.     Conjunctiva/sclera: Conjunctivae normal.     Pupils: Pupils are equal, round, and reactive to light.  Neck:     Musculoskeletal: Normal range of motion and neck supple.  Cardiovascular:     Rate and Rhythm: Normal rate and regular rhythm.     Pulses: Normal pulses.     Heart sounds: Normal heart sounds.  Pulmonary:     Effort: Pulmonary effort is normal.     Breath sounds: Normal breath sounds.  Abdominal:     General: Abdomen is flat. Bowel sounds are normal.     Palpations: Abdomen is soft.  Musculoskeletal: Normal range of motion.  Skin:    General: Skin is warm.     Capillary Refill: Capillary refill takes less than 2 seconds.  Neurological:     General: No focal deficit present.     Mental Status: She is alert and oriented to person, place, and time.  Psychiatric:        Mood and Affect: Mood normal.        Behavior: Behavior normal.        Thought Content: Thought content normal.        Judgment: Judgment normal.      ED Treatments / Results  Labs (all labs ordered are listed, but only abnormal results are displayed) Labs Reviewed  COMPREHENSIVE METABOLIC PANEL - Abnormal; Notable for the following components:      Result Value   Potassium 2.8 (*)    Glucose, Bld 120 (*)    Calcium 7.8 (*)    All other components within normal limits  CBC WITH DIFFERENTIAL/PLATELET - Abnormal; Notable for the following components:   RDW 15.7 (*)    Platelets 148 (*)    Abs Immature Granulocytes 0.08 (*)    All other components within normal limits  URINALYSIS, ROUTINE W REFLEX MICROSCOPIC - Abnormal; Notable for the following components:   Hgb urine dipstick SMALL (*)    Protein, ur  30 (*)    Leukocytes,Ua SMALL (*)    All other components within normal limits  MAGNESIUM - Abnormal; Notable for the following components:   Magnesium 1.3 (*)    All other components within normal limits  URINALYSIS, MICROSCOPIC (REFLEX) - Abnormal; Notable for the following components:   Bacteria, UA RARE (*)    All other components within normal limits  TROPONIN I (HIGH SENSITIVITY)  TROPONIN I (HIGH SENSITIVITY)    EKG EKG Interpretation  Date/Time:  Tuesday September 08 2018 16:51:43 EDT Ventricular Rate:  98 PR Interval:    QRS Duration: 88 QT Interval:  340 QTC Calculation: 434 R Axis:   109 Text Interpretation:  Atrial fibrillation Rightward axis Marked ST abnormality, possible inferior subendocardial injury Abnormal ECG No significant change since last tracing Confirmed by Isla Pence [phone]) on 09/08/2018 4:58:31 PM   Radiology Dg Chest 2 View  Result Date: 09/08/2018 CLINICAL DATA:  Shortness of breath EXAM: CHEST - 2 VIEW COMPARISON:  07/14/2018 FINDINGS: Bilateral diffuse mild interstitial thickening. No pleural effusion or pneumothorax. Stable cardiomediastinal silhouette. Thoracic aortic atherosclerosis. Left  shoulder arthroplasty. No acute osseous abnormality. IMPRESSION: Findings most consistent with mild CHF. Electronically Signed   By: Kathreen Devoid   On: 09/08/2018 18:29    Procedures Procedures (including critical care time)  Medications Ordered in ED Medications  magnesium sulfate IVPB 2 g 50 mL (0 g Intravenous Stopped 09/08/18 1903)  potassium chloride SA (K-DUR) CR tablet 40 mEq (40 mEq Oral Given 09/08/18 1742)  potassium chloride 10 mEq in 100 mL IVPB (0 mEq Intravenous Stopped 09/08/18 1923)     Initial Impression / Assessment and Plan / ED Course  I have reviewed the triage vital signs and the nursing notes.  Pertinent labs & imaging results that were available during my care of the patient were reviewed by me and considered in my medical decision  making (see chart for details).       Pt is feeling better after receiving 2 g of Mg and 10 meq of Kcl IV and 40 meq po.  The pt can't tolerate magnesium pills, so she was given a list of foods high in magnesium.  She is told to increase her oral potassium to 20 mg daily.  2 negative troponins and nl ekg.  Her weight is not up and she is not sob now.  I thinks she is stable for d/c.  Return if worse.  Final Clinical Impressions(s) / ED Diagnoses   Final diagnoses:  Hypokalemia  Hypomagnesemia    ED Discharge Orders    None       Isla Pence, MD 09/08/18 2026

## 2018-09-09 ENCOUNTER — Telehealth: Payer: Self-pay | Admitting: Pharmacist

## 2018-09-09 NOTE — Telephone Encounter (Signed)
Diarrhea for 4 days , now resolved. MG and Potassium infusion given.  Has f/u appointment with MD for 2nd Mg infusion. Appt for coumadin changed to 7/31/at 3:15pm

## 2018-09-10 DIAGNOSIS — I10 Essential (primary) hypertension: Secondary | ICD-10-CM | POA: Diagnosis not present

## 2018-09-10 DIAGNOSIS — E876 Hypokalemia: Secondary | ICD-10-CM | POA: Diagnosis not present

## 2018-09-11 ENCOUNTER — Other Ambulatory Visit: Payer: Self-pay

## 2018-09-11 ENCOUNTER — Ambulatory Visit (INDEPENDENT_AMBULATORY_CARE_PROVIDER_SITE_OTHER): Payer: Medicare Other | Admitting: *Deleted

## 2018-09-11 DIAGNOSIS — I4891 Unspecified atrial fibrillation: Secondary | ICD-10-CM | POA: Diagnosis not present

## 2018-09-11 LAB — POCT INR: INR: 4.8 — AB (ref 2.0–3.0)

## 2018-09-11 NOTE — Progress Notes (Signed)
Subjective: 84 y.o. returns the office today for follow-up evaluation of a wound to the follow-up of her left foot.  She denies her last appointment because of GI issues and other illnesses.  She states that she has noticed a callus formed over the area and some blood.  She denies any drainage or pus otherwise.  No injury.  Denies any fevers, chills, nausea, vomiting.  No calf pain, chest pain, shortness of breath. PCP: Deland Pretty, MD  Objective: AAO 3, NAD DP/PT pulses palpable, CRT less than 3 seconds Protective sensation decreased with Derrel Nip monofilament Hyperkeratotic lesion left foot submetatarsal 1.  Upon debridement there is a granular ulceration measuring 0.2 x 0.2 cm.  Has a depth of 0.2 cm.  There is no probing to bone, undermining or tunneling.  No significant edema, erythema, warmth.  No ascending cellulitis.  There is no fluctuation crepitation any malodor. No pain with calf compression, swelling, warmth, erythema.  Assessment: Recurrent ulceration left submetatarsal 1  Plan: -Treatment options including alternatives, risks, complications were discussed -Debrided the hyperkeratotic tissue today utilizing #312 with scalpel to reveal the underlying ulceration.  Recommend antibiotic ointment dressing changes daily.  Offloading at all times.  Monitoring signs or symptoms of infection.   Return in about 2 weeks (around 09/22/2018).  Trula Slade DPM

## 2018-09-17 DIAGNOSIS — E876 Hypokalemia: Secondary | ICD-10-CM | POA: Diagnosis not present

## 2018-09-21 ENCOUNTER — Ambulatory Visit (INDEPENDENT_AMBULATORY_CARE_PROVIDER_SITE_OTHER): Payer: Medicare Other | Admitting: Pharmacist

## 2018-09-21 ENCOUNTER — Other Ambulatory Visit: Payer: Self-pay

## 2018-09-21 DIAGNOSIS — Z7901 Long term (current) use of anticoagulants: Secondary | ICD-10-CM

## 2018-09-21 DIAGNOSIS — I4891 Unspecified atrial fibrillation: Secondary | ICD-10-CM

## 2018-09-21 LAB — POCT INR: INR: 2.2 (ref 2.0–3.0)

## 2018-09-22 ENCOUNTER — Ambulatory Visit (INDEPENDENT_AMBULATORY_CARE_PROVIDER_SITE_OTHER): Payer: Medicare Other | Admitting: Podiatry

## 2018-09-22 DIAGNOSIS — B351 Tinea unguium: Secondary | ICD-10-CM | POA: Diagnosis not present

## 2018-09-22 DIAGNOSIS — Z7901 Long term (current) use of anticoagulants: Secondary | ICD-10-CM

## 2018-09-22 DIAGNOSIS — M79675 Pain in left toe(s): Secondary | ICD-10-CM

## 2018-09-22 DIAGNOSIS — L84 Corns and callosities: Secondary | ICD-10-CM | POA: Diagnosis not present

## 2018-09-22 DIAGNOSIS — M79674 Pain in right toe(s): Secondary | ICD-10-CM

## 2018-09-23 NOTE — Progress Notes (Signed)
Subjective: 84 y.o. returns the office today for follow-up evaluation of a wound to the follow-up of her left foot.  Overall she thinks the area is doing better.  Denies any drainage or pus or any swelling.  She is also asking for her nails be trimmed nails are thickened elongated she cannot trim them herself.  No other concerns today.  PCP: Deland Pretty, MD  Objective: AAO 3, NAD DP/PT pulses palpable, CRT less than 3 seconds Protective sensation decreased with Derrel Nip monofilament Hyperkeratotic lesion left foot submetatarsal 1.  Upon debridement there is no ongoing ulceration, drainage or any signs of infection.  The area is pre-ulcerative.  No signs of infection. Nails are hypertrophic, dystrophic, brittle, discolored, elongated 10. No surrounding redness or drainage. Tenderness nails 1-5 bilaterally. No open lesions or pre-ulcerative lesions are identified today. No pain with calf compression, swelling, warmth, erythema.  Assessment: Recurrent ulceration left submetatarsal 1; symptomatic onychomycosis  Plan: -Treatment options including alternatives, risks, complications were discussed -Debrided the hyperkeratotic tissue today utilizing #312 with scalpel.  Ongoing ulceration is healed.  Monitoring skin breakdown or any reoccurrence.   -Nails repair x10 without any complications or bleeding.    RTC 3 weeks or sooner if needed.   Trula Slade DPM

## 2018-10-13 ENCOUNTER — Ambulatory Visit (INDEPENDENT_AMBULATORY_CARE_PROVIDER_SITE_OTHER): Payer: Medicare Other | Admitting: Podiatry

## 2018-10-13 ENCOUNTER — Other Ambulatory Visit: Payer: Self-pay

## 2018-10-13 DIAGNOSIS — I6523 Occlusion and stenosis of bilateral carotid arteries: Secondary | ICD-10-CM

## 2018-10-13 DIAGNOSIS — L84 Corns and callosities: Secondary | ICD-10-CM | POA: Diagnosis not present

## 2018-10-13 DIAGNOSIS — Z7901 Long term (current) use of anticoagulants: Secondary | ICD-10-CM

## 2018-10-13 NOTE — Progress Notes (Signed)
Subjective: 84 y.o. returns the office today for follow-up evaluation of a wound to the follow-up of her left foot. It has been doing well until the wound is healed.  Denies any drainage or pus or any swelling or redness.  PCP: Deland Pretty, MD  Objective: AAO 3, NAD DP/PT pulses palpable, CRT less than 3 seconds Protective sensation decreased with Derrel Nip monofilament Hyperkeratotic lesion left foot submetatarsal 1.  There is no ongoing ulceration drainage or any signs of infection.  There is a small scab adjacent to this or piece of skin came off during the bandage change.  This has healed. No pain with calf compression, swelling, warmth, erythema.  Assessment: Healed ulceration left foot metatarsal 1 with pre-ulcerative callus  Plan: -Treatment options including alternatives, risks, complications were discussed -Debrided the hyperkeratotic tissue today utilizing #312 with scalpel without any complications or bleeding. -Continue offloading.   RTC 3 weeks or sooner if needed.   Trula Slade DPM

## 2018-10-15 DIAGNOSIS — E876 Hypokalemia: Secondary | ICD-10-CM | POA: Diagnosis not present

## 2018-10-15 DIAGNOSIS — Z23 Encounter for immunization: Secondary | ICD-10-CM | POA: Diagnosis not present

## 2018-10-20 ENCOUNTER — Other Ambulatory Visit: Payer: Self-pay

## 2018-10-20 ENCOUNTER — Ambulatory Visit (INDEPENDENT_AMBULATORY_CARE_PROVIDER_SITE_OTHER): Payer: Medicare Other | Admitting: Pharmacist

## 2018-10-20 DIAGNOSIS — I4891 Unspecified atrial fibrillation: Secondary | ICD-10-CM

## 2018-10-20 LAB — POCT INR: INR: 2.7 (ref 2.0–3.0)

## 2018-11-02 ENCOUNTER — Other Ambulatory Visit: Payer: Self-pay

## 2018-11-02 ENCOUNTER — Ambulatory Visit (INDEPENDENT_AMBULATORY_CARE_PROVIDER_SITE_OTHER): Payer: Medicare Other | Admitting: Podiatry

## 2018-11-02 ENCOUNTER — Encounter: Payer: Self-pay | Admitting: Podiatry

## 2018-11-02 DIAGNOSIS — L84 Corns and callosities: Secondary | ICD-10-CM | POA: Diagnosis not present

## 2018-11-02 DIAGNOSIS — Z7901 Long term (current) use of anticoagulants: Secondary | ICD-10-CM | POA: Diagnosis not present

## 2018-11-03 DIAGNOSIS — H35352 Cystoid macular degeneration, left eye: Secondary | ICD-10-CM | POA: Diagnosis not present

## 2018-11-03 DIAGNOSIS — H43812 Vitreous degeneration, left eye: Secondary | ICD-10-CM | POA: Diagnosis not present

## 2018-11-03 DIAGNOSIS — H33012 Retinal detachment with single break, left eye: Secondary | ICD-10-CM | POA: Diagnosis not present

## 2018-11-03 DIAGNOSIS — H33059 Total retinal detachment, unspecified eye: Secondary | ICD-10-CM | POA: Diagnosis not present

## 2018-11-03 NOTE — Progress Notes (Signed)
Subjective: 84 y.o. returns the office today for follow-up evaluation of a wound to the follow-up of her left foot.  She states that she is been doing well.  Not seeing any drainage or pus no swelling.  Left second toenail to have an appointment should have trimmed.  No redness or drainage or any swelling.    PCP: Deland Pretty, MD  Objective: AAO 3, NAD DP/PT pulses palpable, CRT less than 3 seconds Protective sensation decreased with Derrel Nip monofilament Hyperkeratotic lesion left foot submetatarsal 1.  There is no ongoing ulceration drainage or any signs of infection.  Wound appears to be healed.  Left second toenail is mildly hypertrophic and dystrophic without any pain. No pain with calf compression, swelling, warmth, erythema.  Assessment: Healed ulceration left foot metatarsal 1 with pre-ulcerative callus  Plan: -Treatment options including alternatives, risks, complications were discussed -Debrided the hyperkeratotic tissue today utilizing #312 with scalpel without any complications or bleeding.  Also debrided the toenail on the left second toe as a courtesy without any complications or bleeding. -Continue offloading.   RTC 3 weeks or sooner if needed.   Trula Slade DPM

## 2018-11-18 ENCOUNTER — Other Ambulatory Visit: Payer: Self-pay

## 2018-11-18 ENCOUNTER — Ambulatory Visit (INDEPENDENT_AMBULATORY_CARE_PROVIDER_SITE_OTHER): Payer: Medicare Other | Admitting: Pharmacist Clinician (PhC)/ Clinical Pharmacy Specialist

## 2018-11-18 DIAGNOSIS — I4891 Unspecified atrial fibrillation: Secondary | ICD-10-CM

## 2018-11-18 LAB — POCT INR: INR: 2.2 (ref 2.0–3.0)

## 2018-11-19 ENCOUNTER — Ambulatory Visit (INDEPENDENT_AMBULATORY_CARE_PROVIDER_SITE_OTHER): Payer: Medicare Other | Admitting: Podiatry

## 2018-11-19 DIAGNOSIS — L84 Corns and callosities: Secondary | ICD-10-CM

## 2018-11-19 DIAGNOSIS — E876 Hypokalemia: Secondary | ICD-10-CM | POA: Diagnosis not present

## 2018-11-19 DIAGNOSIS — Z7901 Long term (current) use of anticoagulants: Secondary | ICD-10-CM

## 2018-11-19 DIAGNOSIS — N390 Urinary tract infection, site not specified: Secondary | ICD-10-CM | POA: Diagnosis not present

## 2018-11-19 DIAGNOSIS — E559 Vitamin D deficiency, unspecified: Secondary | ICD-10-CM | POA: Diagnosis not present

## 2018-11-19 DIAGNOSIS — I10 Essential (primary) hypertension: Secondary | ICD-10-CM | POA: Diagnosis not present

## 2018-11-19 DIAGNOSIS — E785 Hyperlipidemia, unspecified: Secondary | ICD-10-CM | POA: Diagnosis not present

## 2018-11-27 NOTE — Progress Notes (Deleted)
Cardiology Office Note   Date:  11/27/2018   ID:  [name], DOB [birth year], MRN [id number]  PCP:  Deland Pretty, MD  Cardiologist: Alverda Nazzaro Martinique MD  No chief complaint on file.     History of Present Illness: [name] is a 84 y.o. female who presents for follow up CHF and atrial fibrillation.    She was admitted to the hospital in February 2015 because of new onset atrial fibrillation. She was already on long-term Coumadin. Diltiazem for rate control was added to her regimen in the hospital. The patient has a past history of pulmonary emboli and is on long-term Coumadin. She has a history of essential hypertension and a history of right carotid artery occlusion. 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 Donnetta Hutching. Last doppler in October 7893 showed LICA stenosis of 81-01%. Now followed at [facility] at 6 month interrval.  She does not have any significant coronary disease and she had cardiac catheterization in 1994 showing only mild plaque and she had a nuclear stress test in December 2011 showing no ischemia and her ejection fraction was 78%.   On a prior visit she complained of marked fatigue so we discontinued her Lozol without much change. She was maintained on Bystolic and prn lasix.   She was admitted in June 2020 with acute diastolic CHF. Bilateral infiltrates noted on CXR and CT. Small effusion. Echo was unchanged. Managed with IV diuresis.   On follow up today she has a number of complaints but not much from a cardiac standpoint. Denies any dyspnea or chest pain. No palpitations or dizziness. She has a lot of weakness in her legs and poor balance. Getting home PT. Had a spinal injection by Dr. Nelva Bush. 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 Was told magnesium was low but supplements caused too much diarrhea.      Past Medical History:  Diagnosis Date  . Atrial fibrillation ([place])   . Carotid artery  occlusion   . Chronic diastolic CHF (congestive heart failure) (HCC)    Hypertensive heart disease 02-12-14  . Crohn's disease ([place])   . Detached retina   . Fibromyalgia   . GERD (gastroesophageal reflux disease)   . Gout   . H/O hiatal hernia   . High triglycerides   . History of stomach ulcers 1950's  . Hypertension   . Long term current use of anticoagulant   . Obstructive sleep apnea on CPAP   . Osteoarthritis    PAIN AND OA LEF T HIP AND BOTH SHOULDERS ARE BONE ON BONE AND PAINFUL  . Peripheral vascular disease (HCC)    KNOWN RIGHT INTERNAL CAROTID ARTERY OCCLUSION (NO STROKE)  --40 TO 59% STENOSIS LEFT ICA-FOLLOWED BY DR. EARLY WITH DOPPLER STUDY EVERY 6 MONTHS  . Pulmonary embolism ([place]) 2011   a. Hx of PE in 02/2009 after R hip surgery, venous dopplers negative, long-term Coumadin.  [name] Kitchen PVC (premature ventricular contraction)    PT STATES HX OF PVC'S ON EKG  . Recurrent upper respiratory infection (URI)    BRONCHITIS FEB 2013--SLIGHT COUGH NON-PRODUCTIVE NOW  . Recurrent UTI (urinary tract infection)    "on daily medicine" (05/14/2012)  . Tinnitus   . Vertigo     Past Surgical History:  Procedure Laterality Date  . APPENDECTOMY  1950's  . BACK SURGERY  10/29/06   Central and foraminal decompression L3-L4, L4-L5, and L5-S1  with inspection of L4-L5 and L5-S1 disc on the right  . CARDIAC CATHETERIZATION  1970's   "maybe 2" (05/14/2012)  . CARDIAC CATHETERIZATION    . CATARACT EXTRACTION W/ INTRAOCULAR LENS IMPLANT Right 2009  . CHOLECYSTECTOMY  ~ 1988  . DECOMPRESSIVE LUMBAR LAMINECTOMY LEVEL 3  ~ 2002  . DILATION AND CURETTAGE OF UTERUS     "several; from miscarriages" (05/14/2012)  . EXCISION MORTON'S NEUROMA Right 05/20/00   "foot" (05/14/2012)  . EYE SURGERY Left 2014   Detached retina  . EYE SURGERY Left August 23, 2013   Cataract  . FEMUR FRACTURE SURGERY Right 02/21/09   Open reduction internal fixation of right periprosthetic  femur fracture utilizing Zimmer cables times  fiv  . FRACTURE SURGERY  2011   Right Femur Fx  . HAMMER TOE SURGERY Left 10/25/08   "toe next to big to" (05/14/2012)  . JOINT REPLACEMENT  2009   Right Hip replacement  . JOINT REPLACEMENT  2012   Right knee replacement  . JOINT REPLACEMENT  06/17/11   Left Hip replacement  . KNEE ARTHROSCOPY Right 07/26/05  . REPLACEMENT TOTAL KNEE Right 02/19/2010  . SPINE SURGERY    . [place]?  . TOTAL HIP ARTHROPLASTY Right 12/08/02   Osteonics total hip replacement  . TOTAL HIP ARTHROPLASTY  06/17/2011   Procedure: TOTAL HIP ARTHROPLASTY;  Surgeon: Gearlean Alf, MD;  Location: [facility];  Service: Orthopedics;  Laterality: Left;  . TOTAL SHOULDER ARTHROPLASTY Left 05/14/2012  . TOTAL SHOULDER ARTHROPLASTY Left 05/14/2012   Procedure: TOTAL SHOULDER ARTHROPLASTY;  Surgeon: Marin Shutter, MD;  Location: [place];  Service: Orthopedics;  Laterality: Left;  [name] Kitchen VAGINAL HYSTERECTOMY  1971     Current Outpatient Medications  Medication Sig Dispense Refill  . allopurinol (ZYLOPRIM) 300 MG tablet Take 300 mg by mouth daily.    [name] Kitchen amLODipine (NORVASC) 5 MG tablet Take 5 mg by mouth daily.    . brimonidine (ALPHAGAN) 0.2 % ophthalmic solution 1 drop 2 (two) times daily.     . celecoxib (CELEBREX) 100 MG capsule Take 100 mg by mouth daily.     . cetirizine (ZYRTEC) 10 MG tablet Take 10 mg by mouth daily.    . cimetidine (TAGAMET) 200 MG tablet Take 200 mg by mouth 2 (two) times daily.    [name] Kitchen CRANBERRY CONCENTRATE PO Take 1 tablet by mouth daily.    . dorzolamide-timolol (COSOPT) 22.3-6.8 MG/ML ophthalmic solution Place 1 drop into both eyes 2 (two) times daily.     [name] Kitchen FLUZONE HIGH-DOSE QUADRIVALENT 0.7 ML SUSY ADM 0.7ML IM UTD    . furosemide (LASIX) 20 MG tablet TAKE (1/2) TABLET DAILY. 45 tablet 0  . HYDROcodone-acetaminophen (NORCO/VICODIN) 5-325 MG tablet Take 0.5-1 tablets by mouth 3 (three) times daily as needed for pain.     [name] Kitchen ipratropium (ATROVENT) 0.06 % nasal spray Place 1 spray  into both nostrils daily as needed (allergies).     [name] Kitchen LUMIGAN 0.01 % SOLN Place 1 drop into both eyes at bedtime.    . mupirocin ointment (BACTROBAN) 2 % Apply 1 application topically 2 (two) times daily. 30 g 2  . nebivolol (BYSTOLIC) 10 MG tablet Take 1 tablet (10 mg total) by mouth at bedtime. 90 tablet 3  . potassium chloride (K-DUR) 10 MEQ tablet Take 2 tablets (20 mEq total) by mouth daily for 30 days. MWF 60 tablet 0  . potassium chloride (KLOR-CON) 10 MEQ tablet     .  silver sulfADIAZINE (SILVADENE) 1 % cream Apply 1 application topically daily. 50 g 0  . traZODone (DESYREL) 50 MG tablet Take 25 mg by mouth at bedtime.     . triamcinolone (NASACORT ALLERGY 24HR) 55 MCG/ACT AERO nasal inhaler Place 2 sprays into the nose daily as needed (allergies).     . warfarin (COUMADIN) 2.5 MG tablet TAKE 1 TO 1&1/2 TABLETS DAILY AS DIRECTED BY COUMADIN CLINIC. (Patient taking differently: Take 2.5-3.75 mg by mouth as directed. TAKE 1.5 tablets (3.75 mg) on (MWF) & Take 1 tablet (2.5 mg) all other days.) 120 tablet 1   No current facility-administered medications for this visit.     Allergies:   Codeine, Cymbalta [duloxetine hcl], Diltiazem cd [diltiazem hcl er beads], Duloxetine, Metoprolol, Nortriptyline, Penicillins, Statins, Tetanus toxoid adsorbed, Tetanus toxoids, Ciprofloxacin, Lyrica [pregabalin], and Penicillin g    Social History:  The patient  reports that she quit smoking about 32 years ago. Her smoking use included cigarettes. She has a 7.50 pack-year smoking history. She has never used smokeless tobacco. She reports current alcohol use. She reports that she does not use drugs.   Family History:  The patient's family history includes AAA (abdominal aortic aneurysm) in her mother; Aneurysm in her mother; Diabetes in her paternal grandfather and son; Heart attack in her brother; Heart attack (age of onset: 45) in her father; Heart disease in her brother, father, and mother; Hyperlipidemia in  her brother; Hypertension in her brother, brother, father, and mother; Peripheral vascular disease in her father; Stroke in her mother.    ROS:  Please see the history of present illness.   Otherwise, review of systems are positive for none.   All other systems are reviewed and negative.    PHYSICAL EXAM: VS:  There were no vitals taken for this visit. , BMI There is no height or weight on file to calculate BMI. GENERAL:  Well appearing morbidly obese WF in NAD HEENT:  PERRL, EOMI, sclera are clear. Oropharynx is clear. NECK:  No jugular venous distention, carotid upstroke brisk and symmetric, no bruits, no thyromegaly or adenopathy LUNGS:  Clear to auscultation bilaterally CHEST:  Unremarkable HEART:  IRRR, pulse 96 PMI not displaced or sustained,S1 and S2 within normal limits, no S3, no S4: no clicks, no rubs, no murmurs ABD:  Soft, nontender. BS +, no masses or bruits. No hepatomegaly, no splenomegaly EXT:  2 + pulses throughout, no edema, no cyanosis no clubbing SKIN:  Warm and dry.  No rashes NEURO:  Alert and oriented x 3. Cranial nerves II through XII intact. PSYCH:  Cognitively intact  EKG:  EKG is  ordered today. Afib with rate 108. Nonspecific ST-T wave abnormality. I have personally reviewed and interpreted this study.    Recent Labs: Labs dated 05/01/15: cholesterol 153, triglycerides 384, HDL 34, LDL 42. Glucose 150. CMET and CBC otherwise normal. Dated 05/15/16: cholesterol 129, triglycerides 270, HDL 33, LDL 42. A1c 5.9%.  Dated 10/17/16: normal CBC. Creatinine 1.1. Other chemistries normal.  Dated 04/22/17: Hbg 11.9. Creatinine 0.9. Other chemistries normal.  Dated 09/09/17: cholesterol 137, triglycerides 176, HDL 38, LDL 64. Chemistry panel normal. Dated 06/18/18 triglycerides 230, HDL 36.  Dated 07/15/18: A1c 5.2%. Hgb 11.2.  Dated 09/17/18: creatinine normal. Magnesium 1.4.    Wt Readings from Last 3 Encounters:  09/08/18 202 lb (91.6 kg)  07/30/18 205 lb (93 kg)  07/15/18  205 lb 4 oz (93.1 kg)    Echo 07/14/18: IMPRESSIONS    1. The  left ventricle has normal systolic function, with an ejection fraction of 55-60%. The cavity size was normal. Left ventricular diastolic function could not be evaluated secondary to atrial fibrillation.  2. The right ventricle has normal systolic function. The cavity was normal. There is no increase in right ventricular wall thickness. Right ventricular systolic pressure is normal with an estimated pressure of 27.9 mmHg.  3. Left atrial size was severely dilated.  4. Right atrial size was moderately dilated.  5. Trivial pericardial effusion is present.  6. The aortic root and ascending aorta are normal in size and structure.  7. The inferior vena cava was dilated in size with >50% respiratory variability.  Carotid dopplers 07/30/18: Summary: Right Carotid: Evidence consistent with a total occlusion of the right ICA.  Left Carotid: Velocities in the left ICA are consistent with a 60-79% stenosis.               Non-hemodynamically significant plaque noted in the CCA. The ECA               appears >50% stenosed.  Vertebrals:  Bilateral vertebral arteries demonstrate antegrade flow. Subclavians: Normal flow hemodynamics were seen in bilateral subclavian              arteries.  ASSESSMENT AND PLAN:  1. Permanent atrial fibrillation on long-term anticoagulation. She is asymptomatic with good rate control on Bystolic. On long term anticoagulation with Coumadin. Will check INR today. 2. History of pulmonary emboli, on warfarin 3. Essential hypertension with intolerance to metoprolol. Currently well controlled.  4. Chronic diastolic CHF. She is taking lasix 10 mg daily now with good volume control. Stressed importance of sodium restriction. Unfortunately she is unable to cook many days and either goes out or orders in.  5. Morbid obesity.  6. Carotid arterial disease. Occluded RCA. 02-33% LICA. Followed by [facility]. Will now be  checking doppler every 6 months.   Current medicines are reviewed at length with the patient today.  The patient does not have concerns regarding medicines.  The following changes have been made:  no change  Labs/ tests ordered today include:  No orders of the defined types were placed in this encounter.    Disposition: as noted above.   Follow up in 6 months.  Signed, Eniya Cannady Martinique MD, [facility]    11/27/2018 2:43 PM

## 2018-12-03 ENCOUNTER — Ambulatory Visit: Payer: Medicare Other | Admitting: Cardiology

## 2018-12-04 DIAGNOSIS — M5416 Radiculopathy, lumbar region: Secondary | ICD-10-CM | POA: Diagnosis not present

## 2018-12-06 NOTE — Progress Notes (Signed)
Subjective: 84 y.o. returns the office today for follow-up evaluation of a wound to the follow-up of her left foot.  States that she is doing well no swelling or redness or any drainage.  She has no other concerns today.   PCP: Deland Pretty, MD  Objective: AAO 3, NAD-presents with husband DP/PT pulses palpable, CRT less than 3 seconds Protective sensation decreased with Derrel Nip monofilament Hyperkeratotic lesion left foot submetatarsal 1.  There is no ongoing ulceration drainage or any signs of infection.  Wound appears to be healed.   No other open lesions or preulcerative lesions. No pain with calf compression, swelling, warmth, erythema.  Assessment: Healed ulceration left foot metatarsal 1 with pre-ulcerative callus  Plan: -Treatment options including alternatives, risks, complications were discussed -Debrided the hyperkeratotic tissue today utilizing #[id number] with scalpel without any complications or bleeding.  -Continue offloading.   RTC 3 weeks or sooner if needed.   Trula Slade DPM

## 2018-12-10 ENCOUNTER — Other Ambulatory Visit: Payer: Self-pay | Admitting: Cardiology

## 2018-12-14 DIAGNOSIS — H01026 Squamous blepharitis left eye, unspecified eyelid: Secondary | ICD-10-CM | POA: Diagnosis not present

## 2018-12-14 DIAGNOSIS — H40052 Ocular hypertension, left eye: Secondary | ICD-10-CM | POA: Diagnosis not present

## 2018-12-14 DIAGNOSIS — H01023 Squamous blepharitis right eye, unspecified eyelid: Secondary | ICD-10-CM | POA: Diagnosis not present

## 2018-12-14 DIAGNOSIS — Z961 Presence of intraocular lens: Secondary | ICD-10-CM | POA: Diagnosis not present

## 2018-12-14 DIAGNOSIS — S0511XS Contusion of eyeball and orbital tissues, right eye, sequela: Secondary | ICD-10-CM | POA: Diagnosis not present

## 2018-12-14 DIAGNOSIS — H401112 Primary open-angle glaucoma, right eye, moderate stage: Secondary | ICD-10-CM | POA: Diagnosis not present

## 2018-12-14 DIAGNOSIS — H33052 Total retinal detachment, left eye: Secondary | ICD-10-CM | POA: Diagnosis not present

## 2018-12-15 ENCOUNTER — Ambulatory Visit (INDEPENDENT_AMBULATORY_CARE_PROVIDER_SITE_OTHER): Payer: Medicare Other | Admitting: Podiatry

## 2018-12-15 ENCOUNTER — Encounter: Payer: Self-pay | Admitting: Podiatry

## 2018-12-15 ENCOUNTER — Other Ambulatory Visit: Payer: Self-pay

## 2018-12-15 DIAGNOSIS — B351 Tinea unguium: Secondary | ICD-10-CM | POA: Diagnosis not present

## 2018-12-15 DIAGNOSIS — M79675 Pain in left toe(s): Secondary | ICD-10-CM | POA: Diagnosis not present

## 2018-12-15 DIAGNOSIS — Z7901 Long term (current) use of anticoagulants: Secondary | ICD-10-CM | POA: Diagnosis not present

## 2018-12-15 DIAGNOSIS — M79674 Pain in right toe(s): Secondary | ICD-10-CM | POA: Diagnosis not present

## 2018-12-15 DIAGNOSIS — L84 Corns and callosities: Secondary | ICD-10-CM

## 2018-12-16 ENCOUNTER — Encounter: Payer: Self-pay | Admitting: Adult Health

## 2018-12-16 ENCOUNTER — Telehealth: Payer: Medicare Other | Admitting: Physician Assistant

## 2018-12-16 ENCOUNTER — Telehealth (INDEPENDENT_AMBULATORY_CARE_PROVIDER_SITE_OTHER): Payer: Medicare Other | Admitting: Adult Health

## 2018-12-16 VITALS — BP 124/66 | HR 88 | Temp 97.8°F | Ht 62.0 in | Wt 203.0 lb

## 2018-12-16 DIAGNOSIS — I5032 Chronic diastolic (congestive) heart failure: Secondary | ICD-10-CM

## 2018-12-16 DIAGNOSIS — I10 Essential (primary) hypertension: Secondary | ICD-10-CM

## 2018-12-16 DIAGNOSIS — M797 Fibromyalgia: Secondary | ICD-10-CM

## 2018-12-16 DIAGNOSIS — G4733 Obstructive sleep apnea (adult) (pediatric): Secondary | ICD-10-CM

## 2018-12-16 DIAGNOSIS — I4821 Permanent atrial fibrillation: Secondary | ICD-10-CM

## 2018-12-16 NOTE — Progress Notes (Signed)
Virtual Visit via Telephone Note   This visit type was conducted due to national recommendations for restrictions regarding the COVID-19 Pandemic (e.g. social distancing) in an effort to limit this patient's exposure and mitigate transmission in our community.  Due to her co-morbid illnesses, this patient is at least at moderate risk for complications without adequate follow up.  This format is felt to be most appropriate for this patient at this time.  The patient did not have access to video technology/had technical difficulties with video requiring transitioning to audio format only (telephone).  All issues noted in this document were discussed and addressed.  No physical exam could be performed with this format.  Please refer to the patient's chart for her  consent to telehealth for [facility]   Date:  12/16/2018   ID:  [name], DOB 1934/12/19, MRN [id number]  Patient Location: Home Provider Location: Home  PCP:  Deland Pretty, MD  Cardiologist:  Dr.Jordan  Electrophysiologist:  None   Evaluation Performed:  Follow-Up Visit  Chief Complaint:  Follow up Atrial fib  History of Present Illness:    [name] is a 84 y.o. female we are following for ongoing assessment and management of permanent atrial fibrillation on Coumadin (past medical history of pulmonary emboli-previously on Coumadin), CAD, most recent stress test in December 2011 revealing no ischemia with an EF of 78% carotid artery disease, (total occlusion of her right carotid artery, and 50% stenosis of left carotid artery (followed by Dr. Donnetta Hutching.  Other history includes hypertension, chronic diastolic heart failure, morbid obesity and OSA on CPAP.  She has been on diltiazem for rate control and is followed in our [place] line office Coumadin clinic for anticoagulation therapy and management.  She was last seen in the office by Dr. Martinique on 05/22/2018 she was asymptomatic heart rate was well controlled and she was  continued on anticoagulation therapy through our Coumadin clinic.  She had been started by Dr. Shelia Media on Praesel but could not afford it, she is now on Invokana and amiloride.  She remains on 10 mg a day of Lasix.  [name] has multiple somatic complaints today.  She feels tired and "weepy" in the morning, continues to be dyspneic on exertion with O2 sats dropping into the 80s unless she wears her oxygen, she has chronic pain in her back hands and shoulders, she notices that her blood pressure is elevated for sitting in the morning but is better when she is up moving around.  She has changed her medications of amlodipine and Bystolic to evening dose to avoid feeling so tired after she takes them during the daytime.  She states that this is helped some.  She denies rapid heart rhythm, dizziness, or chest pressure.  She denies lower extremity edema, excessive bruising or issues with melena or hemoptysis.  The patient does not have symptoms concerning for COVID-19 infection (fever, chills, cough, or new shortness of breath).    Past Medical History:  Diagnosis Date  . Atrial fibrillation ([place])   . Carotid artery occlusion   . Chronic diastolic CHF (congestive heart failure) (HCC)    Hypertensive heart disease 02-12-14  . Crohn's disease ([place])   . Detached retina   . Fibromyalgia   . GERD (gastroesophageal reflux disease)   . Gout   . H/O hiatal hernia   . High triglycerides   . History of stomach ulcers 1950's  . Hypertension   . Long term current use of anticoagulant   .  Obstructive sleep apnea on CPAP   . Osteoarthritis    PAIN AND OA LEF T HIP AND BOTH SHOULDERS ARE BONE ON BONE AND PAINFUL  . Peripheral vascular disease (HCC)    KNOWN RIGHT INTERNAL CAROTID ARTERY OCCLUSION (NO STROKE)  --40 TO 59% STENOSIS LEFT ICA-FOLLOWED BY DR. EARLY WITH DOPPLER STUDY EVERY 6 MONTHS  . Pulmonary embolism ([place]) 2011   a. Hx of PE in 02/2009 after R hip surgery, venous dopplers negative, long-term  Coumadin.  [name] Kitchen PVC (premature ventricular contraction)    PT STATES HX OF PVC'S ON EKG  . Recurrent upper respiratory infection (URI)    BRONCHITIS FEB 2013--SLIGHT COUGH NON-PRODUCTIVE NOW  . Recurrent UTI (urinary tract infection)    "on daily medicine" (05/14/2012)  . Tinnitus   . Vertigo    Past Surgical History:  Procedure Laterality Date  . APPENDECTOMY  1950's  . BACK SURGERY  10/29/06   Central and foraminal decompression L3-L4, L4-L5, and L5-S1 with inspection of L4-L5 and L5-S1 disc on the right  . CARDIAC CATHETERIZATION  1970's   "maybe 2" (05/14/2012)  . CARDIAC CATHETERIZATION    . CATARACT EXTRACTION W/ INTRAOCULAR LENS IMPLANT Right 2009  . CHOLECYSTECTOMY  ~ 1988  . DECOMPRESSIVE LUMBAR LAMINECTOMY LEVEL 3  ~ 2002  . DILATION AND CURETTAGE OF UTERUS     "several; from miscarriages" (05/14/2012)  . EXCISION MORTON'S NEUROMA Right 05/20/00   "foot" (05/14/2012)  . EYE SURGERY Left 2014   Detached retina  . EYE SURGERY Left August 23, 2013   Cataract  . FEMUR FRACTURE SURGERY Right 02/21/09   Open reduction internal fixation of right periprosthetic  femur fracture utilizing Zimmer cables times fiv  . FRACTURE SURGERY  2011   Right Femur Fx  . HAMMER TOE SURGERY Left 10/25/08   "toe next to big to" (05/14/2012)  . JOINT REPLACEMENT  2009   Right Hip replacement  . JOINT REPLACEMENT  2012   Right knee replacement  . JOINT REPLACEMENT  06/17/11   Left Hip replacement  . KNEE ARTHROSCOPY Right 07/26/05  . REPLACEMENT TOTAL KNEE Right 02/19/2010  . SPINE SURGERY    . [place]?  . TOTAL HIP ARTHROPLASTY Right 12/08/02   Osteonics total hip replacement  . TOTAL HIP ARTHROPLASTY  06/17/2011   Procedure: TOTAL HIP ARTHROPLASTY;  Surgeon: Gearlean Alf, MD;  Location: [facility];  Service: Orthopedics;  Laterality: Left;  . TOTAL SHOULDER ARTHROPLASTY Left 05/14/2012  . TOTAL SHOULDER ARTHROPLASTY Left 05/14/2012   Procedure: TOTAL SHOULDER ARTHROPLASTY;  Surgeon:  Marin Shutter, MD;  Location: [facility];  Service: Orthopedics;  Laterality: Left;  [name] Kitchen VAGINAL HYSTERECTOMY  1971     No outpatient medications have been marked as taking for the 12/16/18 encounter (Appointment) with Lendon Colonel, NP.     Allergies:   Codeine, Cymbalta [duloxetine hcl], Diltiazem cd [diltiazem hcl er beads], Duloxetine, Metoprolol, Nortriptyline, Penicillins, Statins, Tetanus toxoid adsorbed, Tetanus toxoids, Ciprofloxacin, Lyrica [pregabalin], and Penicillin g   Social History   Tobacco Use  . Smoking status: Former Smoker    Packs/day: 0.50    Years: 15.00    Pack years: 7.50    Types: Cigarettes    Quit date: 02/11/1986    Years since quitting: 32.8  . Smokeless tobacco: Never Used  Substance Use Topics  . Alcohol use: Yes    Alcohol/week: 0.0 standard drinks    Comment: 05/14/2012 "have 2-3 drinks/yr" maybe  . Drug use:  No     Family Hx: The patient's family history includes AAA (abdominal aortic aneurysm) in her mother; Aneurysm in her mother; Diabetes in her paternal grandfather and son; Heart attack in her brother; Heart attack (age of onset: 60) in her father; Heart disease in her brother, father, and mother; Hyperlipidemia in her brother; Hypertension in her brother, brother, father, and mother; Peripheral vascular disease in her father; Stroke in her mother.  ROS:   Please see the history of present illness.    All other systems reviewed and are negative.   Prior CV studies:   The following studies were reviewed today:  Carotid Artery Doppler Ultrasound 07/30/2018 Right Carotid: Evidence consistent with a total occlusion of the right ICA.  Left Carotid: Velocities in the left ICA are consistent with a 60-79% stenosis. Non-hemodynamically significant plaque noted in the CCA. The ECA appears >50% stenosed.  Vertebrals:  Bilateral vertebral arteries demonstrate antegrade flow. Subclavians: Normal flow hemodynamics were seen in bilateral subclavian  arteries.  Echocardiogram 07/14/2018 1. The left ventricle has normal systolic function, with an ejection fraction of 55-60%. The cavity size was normal. Left ventricular diastolic function could not be evaluated secondary to atrial fibrillation.  2. The right ventricle has normal systolic function. The cavity was normal. There is no increase in right ventricular wall thickness. Right ventricular systolic pressure is normal with an estimated pressure of 27.9 mmHg.  3. Left atrial size was severely dilated.  4. Right atrial size was moderately dilated.  5. Trivial pericardial effusion is present.  6. The aortic root and ascending aorta are normal in size and structure.  7. The inferior vena cava was dilated in size with >50% respiratory variability.  Labs/Other Tests and Data Reviewed:    EKG:  No ECG reviewed.  Recent Labs: 07/13/2018: B Natriuretic Peptide 249.4 09/08/2018: ALT 12; BUN 12; Creatinine, Ser 0.78; Hemoglobin 12.8; Magnesium 1.3; Platelets 148; Potassium 2.8; Sodium 141   Recent Lipid Panel No results found for: CHOL, TRIG, HDL, CHOLHDL, LDLCALC, LDLDIRECT  Wt Readings from Last 3 Encounters:  09/08/18 202 lb (91.6 kg)  07/30/18 205 lb (93 kg)  07/15/18 205 lb 4 oz (93.1 kg)     Objective:    Vital Signs:  There were no vitals taken for this visit.   VITAL SIGNS:  reviewed GEN:  no acute distress NEURO:  alert and oriented x 3, no obvious focal deficit PSYCH:  normal affect  ASSESSMENT & PLAN:    1.  Permanent atrial fibrillation: She is asymptomatic at rest but is dyspneic on exertion.  Uncertain if this is related to deconditioning versus elevated heart rate.  She cannot tell that she is in atrial fibrillation other than feeling her breathing status changed with walking.  Most recent vital signs reveal a normal heart rate but uncertain how this is affected by exertion.  For now we will continue Bystolic.  She has follow-up appointments in our [place] line Coumadin  clinic.  Would consider having a blood pressure completed during that visit.  2.  Hypertension: Today she states her blood pressure was 144/88 when she first got up but has decreased to 124/66 prior to this visit today.  She states she took something for pain for her back and fibromyalgia discomfort.  This relieved the pain and I believe this affected her blood pressure bringing it down to normal range.  Continue Bystolic and amlodipine as directed.  3.  Chronic diastolic heart failure: She has been compliant with daily  weights.  Weights have been fluctuating 3 to 4 pounds, but she has not had to take extra Lasix as her weight always normalizes within a day or 2.  In fact she states that she is losing weight.  She is recently had labs by her PCP which apparently have been faxed to our office.  I do not have access to those labs at this time.  I would be most interested in her potassium and magnesium status.  She states that she was told that her magnesium was 1.9 and that her potassium was in normal limits based upon the labs.  She is to see her PCP tomorrow and will ask them to send the labs again for our review.  No changes in her medications at this time.  4.  Chronic dyspnea on exertion: She does wear CPAP religiously at nighttime for OSA and does have additional oxygen to wear during the day when she is up walking if she becomes short of breath.  She states that the canister is heavy and bothers her shoulders when she carries it.  She has not followed by a pulmonologist.  May consider referral if PCP finds this to be clinically warranted.  5.  Fibromyalgia: Followed by PCP with pain control management.  COVID-19 Education: The signs and symptoms of COVID-19 were discussed with the patient and how to seek care for testing (follow up with PCP or arrange E-visit).  The importance of social distancing was discussed today.  Time:   Today, I have spent 66mnutes with the patient with telehealth  technology discussing the above problems.     Medication Adjustments/Labs and Tests Ordered: Current medicines are reviewed at length with the patient today.  Concerns regarding medicines are outlined above.   Tests Ordered: No orders of the defined types were placed in this encounter.   Medication Changes: No orders of the defined types were placed in this encounter.   Disposition:  Follow up 6 months with Dr. JMartinique Signed, KPhill Myron LWest Pugh ANP, AACC  12/16/2018 8:52 AM    [facility]

## 2018-12-16 NOTE — Patient Instructions (Signed)
Medication Instructions:  Continue current medications  *If you need a refill on your cardiac medications before your next appointment, please call your pharmacy*  Lab Work: None Ordered  Testing/Procedures: None Ordered  Follow-Up: At [facility], you and your health needs are our priority.  As part of our continuing mission to provide you with exceptional heart care, we have created designated Provider Care Teams.  These Care Teams include your primary Cardiologist (physician) and Advanced Practice Providers (APPs -  Physician Assistants and Nurse Practitioners) who all work together to provide you with the care you need, when you need it.  Your next appointment:   6 months  The format for your next appointment:   In Person  Provider:   Peter Martinique, MD

## 2018-12-17 DIAGNOSIS — R790 Abnormal level of blood mineral: Secondary | ICD-10-CM | POA: Diagnosis not present

## 2018-12-17 DIAGNOSIS — I10 Essential (primary) hypertension: Secondary | ICD-10-CM | POA: Diagnosis not present

## 2018-12-17 DIAGNOSIS — G4733 Obstructive sleep apnea (adult) (pediatric): Secondary | ICD-10-CM | POA: Diagnosis not present

## 2018-12-17 DIAGNOSIS — E559 Vitamin D deficiency, unspecified: Secondary | ICD-10-CM | POA: Diagnosis not present

## 2018-12-17 DIAGNOSIS — L304 Erythema intertrigo: Secondary | ICD-10-CM | POA: Diagnosis not present

## 2018-12-17 DIAGNOSIS — Z0000 Encounter for general adult medical examination without abnormal findings: Secondary | ICD-10-CM | POA: Diagnosis not present

## 2018-12-21 NOTE — Progress Notes (Signed)
Subjective: 84 y.o. returns the office today for follow-up evaluation of a wound to the follow-up of her left foot as well as for toenails that are causing discomfort.  States that she is doing well no swelling or redness or any drainage.  She has no other concerns today.   PCP: Deland Pretty, MD  Objective: AAO 3, NAD-presents with husband DP/PT pulses palpable, CRT less than 3 seconds Protective sensation decreased with Derrel Nip monofilament Hyperkeratotic lesion left foot submetatarsal 1.  There is no ongoing ulceration drainage or any signs of infection.  Wound appears to be healed.   Nails are hypertrophic, dystrophic with yellow-brown discoloration.  No edema, erythema, drainage or pus or signs of infection. No other open lesions or preulcerative lesions. No pain with calf compression, swelling, warmth, erythema.  Assessment: Healed ulceration left foot metatarsal 1 with pre-ulcerative callus; symptomatic onychomycosis  Plan: -Treatment options including alternatives, risks, complications were discussed -Debrided the hyperkeratotic tissue today utilizing #312 with scalpel without any complications or bleeding.  -Nails debrided x10 without any complications or bleeding -Continue offloading.   RTC 3 weeks or sooner if needed.   Trula Slade DPM

## 2018-12-27 ENCOUNTER — Inpatient Hospital Stay (HOSPITAL_COMMUNITY): Payer: Medicare Other

## 2018-12-27 ENCOUNTER — Inpatient Hospital Stay (HOSPITAL_COMMUNITY)
Admission: EM | Admit: 2018-12-27 | Discharge: 2018-12-31 | DRG: 291 | Disposition: A | Discharge status: Home Health | Payer: Medicare Other | Attending: Internal Medicine | Admitting: Internal Medicine

## 2018-12-27 ENCOUNTER — Encounter (HOSPITAL_COMMUNITY): Payer: Self-pay

## 2018-12-27 ENCOUNTER — Other Ambulatory Visit: Payer: Self-pay

## 2018-12-27 ENCOUNTER — Emergency Department (HOSPITAL_COMMUNITY): Payer: Medicare Other

## 2018-12-27 DIAGNOSIS — J9621 Acute and chronic respiratory failure with hypoxia: Secondary | ICD-10-CM | POA: Diagnosis present

## 2018-12-27 DIAGNOSIS — Z20828 Contact with and (suspected) exposure to other viral communicable diseases: Secondary | ICD-10-CM | POA: Diagnosis present

## 2018-12-27 DIAGNOSIS — I6529 Occlusion and stenosis of unspecified carotid artery: Secondary | ICD-10-CM | POA: Diagnosis present

## 2018-12-27 DIAGNOSIS — Z79899 Other long term (current) drug therapy: Secondary | ICD-10-CM | POA: Diagnosis not present

## 2018-12-27 DIAGNOSIS — E876 Hypokalemia: Secondary | ICD-10-CM | POA: Diagnosis present

## 2018-12-27 DIAGNOSIS — I482 Chronic atrial fibrillation, unspecified: Secondary | ICD-10-CM | POA: Diagnosis present

## 2018-12-27 DIAGNOSIS — M797 Fibromyalgia: Secondary | ICD-10-CM | POA: Diagnosis present

## 2018-12-27 DIAGNOSIS — K5090 Crohn's disease, unspecified, without complications: Secondary | ICD-10-CM | POA: Diagnosis present

## 2018-12-27 DIAGNOSIS — Z887 Allergy status to serum and vaccine status: Secondary | ICD-10-CM

## 2018-12-27 DIAGNOSIS — T502X5A Adverse effect of carbonic-anhydrase inhibitors, benzothiadiazides and other diuretics, initial encounter: Secondary | ICD-10-CM | POA: Diagnosis present

## 2018-12-27 DIAGNOSIS — G4733 Obstructive sleep apnea (adult) (pediatric): Secondary | ICD-10-CM | POA: Diagnosis present

## 2018-12-27 DIAGNOSIS — M1990 Unspecified osteoarthritis, unspecified site: Secondary | ICD-10-CM | POA: Diagnosis present

## 2018-12-27 DIAGNOSIS — E785 Hyperlipidemia, unspecified: Secondary | ICD-10-CM | POA: Diagnosis present

## 2018-12-27 DIAGNOSIS — I4821 Permanent atrial fibrillation: Secondary | ICD-10-CM | POA: Diagnosis present

## 2018-12-27 DIAGNOSIS — Z7901 Long term (current) use of anticoagulants: Secondary | ICD-10-CM

## 2018-12-27 DIAGNOSIS — I4891 Unspecified atrial fibrillation: Secondary | ICD-10-CM | POA: Diagnosis present

## 2018-12-27 DIAGNOSIS — I110 Hypertensive heart disease with heart failure: Principal | ICD-10-CM | POA: Diagnosis present

## 2018-12-27 DIAGNOSIS — J310 Chronic rhinitis: Secondary | ICD-10-CM | POA: Diagnosis present

## 2018-12-27 DIAGNOSIS — J189 Pneumonia, unspecified organism: Secondary | ICD-10-CM | POA: Diagnosis present

## 2018-12-27 DIAGNOSIS — N179 Acute kidney failure, unspecified: Secondary | ICD-10-CM | POA: Diagnosis present

## 2018-12-27 DIAGNOSIS — I5033 Acute on chronic diastolic (congestive) heart failure: Secondary | ICD-10-CM | POA: Diagnosis present

## 2018-12-27 DIAGNOSIS — E781 Pure hyperglyceridemia: Secondary | ICD-10-CM | POA: Diagnosis present

## 2018-12-27 DIAGNOSIS — Z86711 Personal history of pulmonary embolism: Secondary | ICD-10-CM

## 2018-12-27 DIAGNOSIS — M109 Gout, unspecified: Secondary | ICD-10-CM | POA: Diagnosis present

## 2018-12-27 DIAGNOSIS — K279 Peptic ulcer, site unspecified, unspecified as acute or chronic, without hemorrhage or perforation: Secondary | ICD-10-CM | POA: Diagnosis present

## 2018-12-27 DIAGNOSIS — Z885 Allergy status to narcotic agent status: Secondary | ICD-10-CM

## 2018-12-27 DIAGNOSIS — H409 Unspecified glaucoma: Secondary | ICD-10-CM | POA: Diagnosis present

## 2018-12-27 DIAGNOSIS — Z87891 Personal history of nicotine dependence: Secondary | ICD-10-CM

## 2018-12-27 DIAGNOSIS — Z881 Allergy status to other antibiotic agents status: Secondary | ICD-10-CM

## 2018-12-27 DIAGNOSIS — K219 Gastro-esophageal reflux disease without esophagitis: Secondary | ICD-10-CM | POA: Diagnosis present

## 2018-12-27 DIAGNOSIS — Z8711 Personal history of peptic ulcer disease: Secondary | ICD-10-CM

## 2018-12-27 DIAGNOSIS — R0602 Shortness of breath: Secondary | ICD-10-CM | POA: Diagnosis not present

## 2018-12-27 DIAGNOSIS — R000 Tachycardia, unspecified: Secondary | ICD-10-CM | POA: Diagnosis not present

## 2018-12-27 DIAGNOSIS — I739 Peripheral vascular disease, unspecified: Secondary | ICD-10-CM | POA: Diagnosis present

## 2018-12-27 DIAGNOSIS — I509 Heart failure, unspecified: Secondary | ICD-10-CM | POA: Insufficient documentation

## 2018-12-27 DIAGNOSIS — Z888 Allergy status to other drugs, medicaments and biological substances status: Secondary | ICD-10-CM

## 2018-12-27 DIAGNOSIS — Z87440 Personal history of urinary (tract) infections: Secondary | ICD-10-CM

## 2018-12-27 DIAGNOSIS — Z8249 Family history of ischemic heart disease and other diseases of the circulatory system: Secondary | ICD-10-CM

## 2018-12-27 DIAGNOSIS — Z8349 Family history of other endocrine, nutritional and metabolic diseases: Secondary | ICD-10-CM

## 2018-12-27 DIAGNOSIS — R05 Cough: Secondary | ICD-10-CM | POA: Diagnosis not present

## 2018-12-27 DIAGNOSIS — Z880 Allergy status to penicillin: Secondary | ICD-10-CM

## 2018-12-27 DIAGNOSIS — R0789 Other chest pain: Secondary | ICD-10-CM | POA: Diagnosis not present

## 2018-12-27 HISTORY — DX: Pneumonia, unspecified organism: J18.9

## 2018-12-27 LAB — CBC WITH DIFFERENTIAL/PLATELET
Abs Immature Granulocytes: 0.1 10*3/uL — ABNORMAL HIGH (ref 0.00–0.07)
Basophils Absolute: 0.1 10*3/uL (ref 0.0–0.1)
Basophils Relative: 1 %
Eosinophils Absolute: 0.1 10*3/uL (ref 0.0–0.5)
Eosinophils Relative: 1 %
HCT: 42.4 % (ref 36.0–46.0)
Hemoglobin: 12.7 g/dL (ref 12.0–15.0)
Immature Granulocytes: 1 %
Lymphocytes Relative: 7 %
Lymphs Abs: 0.8 10*3/uL (ref 0.7–4.0)
MCH: 28 pg (ref 26.0–34.0)
MCHC: 30 g/dL (ref 30.0–36.0)
MCV: 93.4 fL (ref 80.0–100.0)
Monocytes Absolute: 0.6 10*3/uL (ref 0.1–1.0)
Monocytes Relative: 6 %
Neutro Abs: 9 10*3/uL — ABNORMAL HIGH (ref 1.7–7.7)
Neutrophils Relative %: 84 %
Platelets: 129 10*3/uL — ABNORMAL LOW (ref 150–400)
RBC: 4.54 MIL/uL (ref 3.87–5.11)
RDW: 16.6 % — ABNORMAL HIGH (ref 11.5–15.5)
WBC: 10.7 10*3/uL — ABNORMAL HIGH (ref 4.0–10.5)
nRBC: 0 % (ref 0.0–0.2)

## 2018-12-27 LAB — BASIC METABOLIC PANEL
Anion gap: 12 (ref 5–15)
BUN: 12 mg/dL (ref 8–23)
CO2: 24 mmol/L (ref 22–32)
Calcium: 8.5 mg/dL — ABNORMAL LOW (ref 8.9–10.3)
Chloride: 106 mmol/L (ref 98–111)
Creatinine, Ser: 0.74 mg/dL (ref 0.44–1.00)
GFR calc Af Amer: >60 mL/min (ref >60)
GFR calc non Af Amer: >60 mL/min (ref >60)
Glucose, Bld: 160 mg/dL — ABNORMAL HIGH (ref 70–99)
Potassium: 4 mmol/L (ref 3.5–5.1)
Sodium: 142 mmol/L (ref 135–145)

## 2018-12-27 LAB — LACTIC ACID, PLASMA: Lactic Acid, Venous: 1.5 mmol/L (ref 0.5–1.9)

## 2018-12-27 LAB — PROTIME-INR
INR: 2.5 — ABNORMAL HIGH (ref 0.8–1.2)
Prothrombin Time: 26.8 seconds — ABNORMAL HIGH (ref 11.4–15.2)

## 2018-12-27 LAB — SARS CORONAVIRUS 2 (TAT 6-24 HRS): SARS Coronavirus 2: NEGATIVE

## 2018-12-27 LAB — STREP PNEUMONIAE URINARY ANTIGEN: Strep Pneumo Urinary Antigen: NEGATIVE

## 2018-12-27 LAB — TROPONIN I (HIGH SENSITIVITY)
Troponin I (High Sensitivity): 8 ng/L (ref <18)
Troponin I (High Sensitivity): 9 ng/L (ref <18)

## 2018-12-27 LAB — BRAIN NATRIURETIC PEPTIDE: B Natriuretic Peptide: 300.3 pg/mL — ABNORMAL HIGH (ref 0.0–100.0)

## 2018-12-27 MED ORDER — LATANOPROST 0.005 % OP SOLN
1.0000 [drp] | Freq: Every day | OPHTHALMIC | Status: DC
  Administered 2018-12-27 – 2018-12-30 (×4): 1 [drp] via OPHTHALMIC
  Filled 2018-12-27 (×2): qty 2.5

## 2018-12-27 MED ORDER — IPRATROPIUM BROMIDE 0.06 % NA SOLN
1.0000 | Freq: Every day | NASAL | Status: DC | PRN
  Filled 2018-12-27: qty 15

## 2018-12-27 MED ORDER — WARFARIN SODIUM 2.5 MG PO TABS
2.5000 mg | ORAL_TABLET | Freq: Once | ORAL | Status: AC
  Administered 2018-12-27: 2.5 mg via ORAL
  Filled 2018-12-27: qty 1

## 2018-12-27 MED ORDER — HYDRALAZINE HCL 20 MG/ML IJ SOLN: 5.0000 mg | Freq: Four times a day (QID) | INTRAMUSCULAR | Status: DC | PRN

## 2018-12-27 MED ORDER — NEBIVOLOL HCL 10 MG PO TABS
10.0000 mg | ORAL_TABLET | Freq: Once | ORAL | Status: DC
  Filled 2018-12-27: qty 1

## 2018-12-27 MED ORDER — LORATADINE 10 MG PO TABS
10.0000 mg | ORAL_TABLET | Freq: Every day | ORAL | Status: DC
  Administered 2018-12-27 – 2018-12-31 (×5): 10 mg via ORAL
  Filled 2018-12-27 (×5): qty 1

## 2018-12-27 MED ORDER — ALLOPURINOL 300 MG PO TABS
300.0000 mg | ORAL_TABLET | Freq: Every day | ORAL | Status: DC
  Administered 2018-12-27 – 2018-12-31 (×5): 300 mg via ORAL
  Filled 2018-12-27 (×5): qty 1

## 2018-12-27 MED ORDER — ACETAMINOPHEN 325 MG PO TABS: 650.0000 mg | ORAL_TABLET | Freq: Four times a day (QID) | ORAL | Status: DC | PRN

## 2018-12-27 MED ORDER — DORZOLAMIDE HCL-TIMOLOL MAL 2-0.5 % OP SOLN
1.0000 [drp] | Freq: Two times a day (BID) | OPHTHALMIC | Status: DC
  Administered 2018-12-27 – 2018-12-31 (×9): 1 [drp] via OPHTHALMIC
  Filled 2018-12-27: qty 10

## 2018-12-27 MED ORDER — TRAZODONE HCL 50 MG PO TABS
25.0000 mg | ORAL_TABLET | Freq: Every day | ORAL | Status: DC
  Filled 2018-12-27 (×2): qty 1

## 2018-12-27 MED ORDER — ENOXAPARIN SODIUM 40 MG/0.4ML ~~LOC~~ SOLN
40.0000 mg | Freq: Every day | SUBCUTANEOUS | Status: DC
  Filled 2018-12-27: qty 0.4

## 2018-12-27 MED ORDER — TRIAMCINOLONE ACETONIDE 55 MCG/ACT NA AERO: 2.0000 | INHALATION_SPRAY | Freq: Every day | NASAL | Status: DC | PRN

## 2018-12-27 MED ORDER — AMLODIPINE BESYLATE 5 MG PO TABS
5.0000 mg | ORAL_TABLET | Freq: Every day | ORAL | Status: DC
  Administered 2018-12-27 – 2018-12-31 (×5): 5 mg via ORAL
  Filled 2018-12-27 (×5): qty 1

## 2018-12-27 MED ORDER — FAMOTIDINE 20 MG PO TABS
10.0000 mg | ORAL_TABLET | Freq: Two times a day (BID) | ORAL | Status: DC
  Administered 2018-12-27 – 2018-12-31 (×9): 10 mg via ORAL
  Filled 2018-12-27 (×9): qty 1

## 2018-12-27 MED ORDER — POTASSIUM CHLORIDE CRYS ER 20 MEQ PO TBCR
20.0000 meq | EXTENDED_RELEASE_TABLET | Freq: Every day | ORAL | Status: DC
  Administered 2018-12-27 – 2018-12-31 (×5): 20 meq via ORAL
  Filled 2018-12-27 (×5): qty 1

## 2018-12-27 MED ORDER — NEBIVOLOL HCL 10 MG PO TABS
10.0000 mg | ORAL_TABLET | Freq: Every day | ORAL | Status: DC
  Administered 2018-12-27 – 2018-12-30 (×4): 10 mg via ORAL
  Filled 2018-12-27 (×6): qty 1

## 2018-12-27 MED ORDER — SODIUM CHLORIDE 0.9 % IV SOLN
500.0000 mg | Freq: Once | INTRAVENOUS | Status: AC
  Administered 2018-12-27: 500 mg via INTRAVENOUS
  Filled 2018-12-27: qty 500

## 2018-12-27 MED ORDER — FUROSEMIDE 10 MG/ML IJ SOLN
40.0000 mg | Freq: Once | INTRAMUSCULAR | Status: AC
  Administered 2018-12-27: 40 mg via INTRAVENOUS
  Filled 2018-12-27: qty 4

## 2018-12-27 MED ORDER — WARFARIN - PHARMACIST DOSING INPATIENT: Freq: Every day | Status: DC

## 2018-12-27 MED ORDER — SODIUM CHLORIDE 0.9 % IV SOLN
1.0000 g | Freq: Once | INTRAVENOUS | Status: AC
  Administered 2018-12-27: 1 g via INTRAVENOUS
  Filled 2018-12-27: qty 10

## 2018-12-27 MED ORDER — SODIUM CHLORIDE 0.9 % IV SOLN
1.0000 g | INTRAVENOUS | Status: DC
  Administered 2018-12-28 – 2018-12-31 (×4): 1 g via INTRAVENOUS
  Filled 2018-12-27: qty 10
  Filled 2018-12-27 (×3): qty 1

## 2018-12-27 MED ORDER — FUROSEMIDE 10 MG/ML IJ SOLN
20.0000 mg | Freq: Every day | INTRAMUSCULAR | Status: DC
  Administered 2018-12-27 – 2018-12-28 (×2): 20 mg via INTRAVENOUS
  Filled 2018-12-27 (×2): qty 2

## 2018-12-27 MED ORDER — SODIUM CHLORIDE 0.9 % IV SOLN
500.0000 mg | INTRAVENOUS | Status: DC
  Filled 2018-12-27: qty 500

## 2018-12-27 MED ORDER — ACETAMINOPHEN 650 MG RE SUPP: 650.0000 mg | Freq: Four times a day (QID) | RECTAL | Status: DC | PRN

## 2018-12-27 MED ORDER — ORAL CARE MOUTH RINSE
15.0000 mL | Freq: Two times a day (BID) | OROMUCOSAL | Status: DC
  Administered 2018-12-27 – 2018-12-31 (×7): 15 mL via OROMUCOSAL

## 2018-12-27 MED ORDER — HYDROCODONE-ACETAMINOPHEN 5-325 MG PO TABS
0.5000 | ORAL_TABLET | Freq: Three times a day (TID) | ORAL | Status: DC | PRN
  Administered 2018-12-27 – 2018-12-31 (×10): 1 via ORAL
  Filled 2018-12-27 (×11): qty 1

## 2018-12-27 MED ORDER — BRIMONIDINE TARTRATE 0.2 % OP SOLN
1.0000 [drp] | Freq: Two times a day (BID) | OPHTHALMIC | Status: DC
  Administered 2018-12-27 – 2018-12-31 (×9): 1 [drp] via OPHTHALMIC
  Filled 2018-12-27: qty 5

## 2018-12-27 NOTE — Progress Notes (Addendum)
PROGRESS NOTE    ARLOA PRAK  WUJ:[id number] DOB: Dec 31, 1934 DOA: 12/27/2018 PCP: Deland Pretty, MD   Brief Narrative:  Annasofia Pohl  is a 84 y.o. female, w Crohn's disease, Gerd, PUD, hypertension, hyperlipidemia, Carotid stenosis, Pafib, CHF (diastolic), OSA on Cpap, h/o PE, presents with c/o fever 100.5, and cough w yellow sputum and sob starting yesterday. Pt thinks gained 1.5 lbs.  Slight swelling of her ankles,  Left > right.  Pt denies cp, palp, orthopnea, pnd.  N/v, abd pain, diarrhea, brbpr.  Pt denies any covid-19 exposure  Assessment & Plan:   Principal Problem:   Acute on chronic diastolic congestive heart failure (HCC) Active Problems:   Atrial fibrillation with RVR (HCC)   Chronic atrial fibrillation (HCC)   CAP (community acquired pneumonia)   Acute hypoxic respiratory failure in the setting of acute on Chronic Diastolic CHF (EF 95%), POA Symptomatically improving daily  Continues to require 4 L nasal cannula even at rest-ambulatory O2 screening in the next 24 hours Baseline is 2 L nasal cannula around-the-clock Continue Bystolic 62ZH po qday Continue Kcl 63mq po qday Lasix 215miv qday -continues to diurese quite well  Afib with RVR, resolved; likely provoked given below Trop within normal limits Recent echo June 2011, no need for repeat at this time given improvement Extra dose of Bystolic 1008MVo x1, since allergic to metoprolol, and cardizem.  Cont Bystolic as above Coumadin pharmacy to dose -INR 2.5  CAP probably the trigger of Afib w RVR/ Acute CHF Chest x-ray personally reviewed, questionable right lower lobe infiltrate  Blood culture x2 preliminary negative Urine legionella antigen Urine strep antigen negative Covid -19 negative Rocephin 1gm iv qday , Zithromax 50056mv qday  Hypertension Cont Bystolic Cont Amlodipine 5mg87m qday  Gout Cont Allopurinol 300mg60mqday  Glaucoma Cont Alphagan Cont Cosopt  Allerghic Rhinitis Cont  Zyrtec-> Claritin Cont Atrovent nasal spray Cont Nasacort   Gerd/ PUD Cont Tagamet  OSA Cpap  DVT Prophylaxis-   Coumadin - SCDs   Code Status:  FULL CODE per patient  Subjective: No acute issues or events overnight, clinically improving, admits to ongoing shortness of breath weakness fatigue and malaise but moderately improved since yesterday.  Otherwise denies overt nausea, vomiting, diarrhea, constipation, headache, fevers, chills.  Objective: Vitals:   12/27/18 0331 12/27/18 0415 12/27/18 0600 12/27/18 0800  BP: (!) 182/89 (!) 162/89 (!) 158/97   Pulse: (!) 106 (!) 114 (!) 103   Resp: (!) 26 (!) 26 (!) 24   Temp: 98.6 F (37 C)     TempSrc: Oral     SpO2: 98% 96% 98%   Weight:    92 kg  Height:    5' 2"  (1.575 m)    Intake/Output Summary (Last 24 hours) at 12/27/2018 0814 [id number] data filed at 12/27/2018 0636 Gross per 24 hour  Intake 100 ml  Output -  Net 100 ml   Filed Weights   12/27/18 0800  Weight: 92 kg    Examination:  General exam: Comfortably, no acute distress, nontoxic in appearance Respiratory system: Right lower lobe rhonchi without overt wheeze or rales Cardiovascular system: S1 & S2 heard, RRR. No JVD, murmurs, rubs, gallops or clicks. No pedal edema. Gastrointestinal system: Abdomen is nondistended, soft and nontender. No organomegaly or masses felt. Normal bowel sounds heard. Central nervous system: Alert and oriented. No focal neurological deficits. Extremities: Symmetric 5 x 5 power. Skin: No rashes, lesions or ulcers  Data Reviewed: I have personally  reviewed following labs and imaging studies  CBC: Recent Labs  Lab 12/27/18 0340  WBC 10.7*  NEUTROABS 9.0*  HGB 12.7  HCT 42.4  MCV 93.4  PLT 030*   Basic Metabolic Panel: Recent Labs  Lab 12/27/18 0340  NA 142  K 4.0  CL 106  CO2 24  GLUCOSE 160*  BUN 12  CREATININE 0.74  CALCIUM 8.5*   GFR: Estimated Creatinine Clearance: 57.3 mL/min (by C-G formula based on SCr  of 0.74 mg/dL).  Coagulation Profile: Recent Labs  Lab 12/27/18 0540  INR 2.5*   Sepsis Labs: Recent Labs  Lab 12/27/18 0342  LATICACIDVEN 1.5    Radiology Studies: Dg Chest Port 1 View  Result Date: 12/27/2018 CLINICAL DATA:  Shortness of breath EXAM: PORTABLE CHEST 1 VIEW COMPARISON:  09/08/2018 FINDINGS: Cardiomegaly with vascular congestion and diffuse interstitial opacities compatible with edema. Small bilateral effusions. Left base atelectasis or infiltrate. IMPRESSION: Cardiomegaly, mild pulmonary edema. Small bilateral effusions. Left lower lobe atelectasis or infiltrate. Electronically Signed   By: Rolm Baptise M.D.   On: 12/27/2018 03:52   Scheduled Meds: . furosemide  20 mg Intravenous Daily  . nebivolol  10 mg Oral Once   Continuous Infusions: . [START ON 12/28/2018] azithromycin    . [START ON 12/28/2018] cefTRIAXone (ROCEPHIN)  IV       LOS: 0 days    Time spent: 4mn    Rohn Fritsch C Jaydeen Darley, DO [facility]  If 7PM-7AM, please contact night-coverage [email or web address] Password [email or web address] 12/27/2018, 8:14 AM

## 2018-12-27 NOTE — ED Provider Notes (Signed)
[facility] DEPT Provider Note   CSN: [id number] Arrival date & time: 12/27/18  4332     History   Chief Complaint No chief complaint on file.   HPI [name] is a 84 y.o. female.     Patient presents to the emergency department for evaluation of shortness of breath and chest heaviness.  Symptoms began earlier today.  She has had a slight cough and occasional mucus production, but not significantly different than her normal cough.  Patient normally uses an oxygen concentrator when she is up and around, does not use oxygen regularly otherwise.  She often does get short of breath with exertion but tonight it is very short of breath at rest.  She thinks she has put on about 1-1/2 pounds today.  She has a history of heart failure and does have increased swelling of her legs.     Past Medical History:  Diagnosis Date  . Atrial fibrillation ([place])   . Carotid artery occlusion   . Chronic diastolic CHF (congestive heart failure) (HCC)    Hypertensive heart disease 02-12-14  . Crohn's disease ([place])   . Detached retina   . Fibromyalgia   . GERD (gastroesophageal reflux disease)   . Gout   . H/O hiatal hernia   . High triglycerides   . History of stomach ulcers 1950's  . Hypertension   . Long term current use of anticoagulant   . Obstructive sleep apnea on CPAP   . Osteoarthritis    PAIN AND OA LEF T HIP AND BOTH SHOULDERS ARE BONE ON BONE AND PAINFUL  . Peripheral vascular disease (HCC)    KNOWN RIGHT INTERNAL CAROTID ARTERY OCCLUSION (NO STROKE)  --40 TO 59% STENOSIS LEFT ICA-FOLLOWED BY DR. EARLY WITH DOPPLER STUDY EVERY 6 MONTHS  . Pulmonary embolism ([place]) 2011   a. Hx of PE in 02/2009 after R hip surgery, venous dopplers negative, long-term Coumadin.  [name] Kitchen PVC (premature ventricular contraction)    PT STATES HX OF PVC'S ON EKG  . Recurrent upper respiratory infection (URI)    BRONCHITIS FEB 2013--SLIGHT COUGH NON-PRODUCTIVE NOW  . Recurrent UTI  (urinary tract infection)    "on daily medicine" (05/14/2012)  . Tinnitus   . Vertigo     Patient Active Problem List   Diagnosis Date Noted  . Acute on chronic diastolic (congestive) heart failure ([place]) 07/14/2018  . Acute on chronic diastolic heart failure ([place]) 07/13/2018  . Lumbosacral spondylosis without myelopathy 09/26/2017  . Chronic low back pain 07/21/2017  . Degeneration of lumbar intervertebral disc 04/21/2017  . Lumbar post-laminectomy syndrome 04/21/2017  . Esophageal dysphagia 10/15/2016  . Gastroesophageal reflux disease without esophagitis 10/15/2016  . Seasonal allergic rhinitis 10/15/2016  . Chronic atrial fibrillation ([place]) 05/15/2015  . Aftercare following surgery of the circulatory system, [place] 09/15/2013  . Essential hypertension 06/17/2013  . Atrial fibrillation with RVR ([place]) 04/01/2013  . Morbid obesity ([place]) 04/01/2013  . OSA on CPAP 04/01/2013  . Chronic diastolic CHF (congestive heart failure) ([place])   . H/o recurrent pulmonary embolism, on Coumadin 05/24/2012  . Acute on chronic diastolic congestive heart failure ([place]) 05/24/2012  . Normocytic anemia 05/24/2012  . Thrombocytopenia ([place]) 05/24/2012  . Inadequate anticoagulation 05/24/2012  . Elevated brain natriuretic peptide (BNP) level 05/24/2012  . Shoulder arthritis 05/15/2012  . Stress incontinence 01/06/2012  . Chronic cystitis 01/06/2012  . Occlusion and stenosis of carotid artery without mention of cerebral infarction 09/10/2011  . OA (osteoarthritis) of hip 06/17/2011  .  Osteoarthritis 01/08/2011  . Fibromyalgia 08/06/2010  . Dyslipidemia 08/06/2010  . Gout 08/06/2010  . Right carotid artery occlusion 08/06/2010    Past Surgical History:  Procedure Laterality Date  . APPENDECTOMY  1950's  . BACK SURGERY  10/29/06   Central and foraminal decompression L3-L4, L4-L5, and L5-S1 with inspection of L4-L5 and L5-S1 disc on the right  . CARDIAC CATHETERIZATION  1970's   "maybe 2" (05/14/2012)  .  CARDIAC CATHETERIZATION    . CATARACT EXTRACTION W/ INTRAOCULAR LENS IMPLANT Right 2009  . CHOLECYSTECTOMY  ~ 1988  . DECOMPRESSIVE LUMBAR LAMINECTOMY LEVEL 3  ~ 2002  . DILATION AND CURETTAGE OF UTERUS     "several; from miscarriages" (05/14/2012)  . EXCISION MORTON'S NEUROMA Right 05/20/00   "foot" (05/14/2012)  . EYE SURGERY Left 2014   Detached retina  . EYE SURGERY Left August 23, 2013   Cataract  . FEMUR FRACTURE SURGERY Right 02/21/09   Open reduction internal fixation of right periprosthetic  femur fracture utilizing Zimmer cables times fiv  . FRACTURE SURGERY  2011   Right Femur Fx  . HAMMER TOE SURGERY Left 10/25/08   "toe next to big to" (05/14/2012)  . JOINT REPLACEMENT  2009   Right Hip replacement  . JOINT REPLACEMENT  2012   Right knee replacement  . JOINT REPLACEMENT  06/17/11   Left Hip replacement  . KNEE ARTHROSCOPY Right 07/26/05  . REPLACEMENT TOTAL KNEE Right 02/19/2010  . SPINE SURGERY    . [place]?  . TOTAL HIP ARTHROPLASTY Right 12/08/02   Osteonics total hip replacement  . TOTAL HIP ARTHROPLASTY  06/17/2011   Procedure: TOTAL HIP ARTHROPLASTY;  Surgeon: Gearlean Alf, MD;  Location: [facility];  Service: Orthopedics;  Laterality: Left;  . TOTAL SHOULDER ARTHROPLASTY Left 05/14/2012  . TOTAL SHOULDER ARTHROPLASTY Left 05/14/2012   Procedure: TOTAL SHOULDER ARTHROPLASTY;  Surgeon: Marin Shutter, MD;  Location: [facility];  Service: Orthopedics;  Laterality: Left;  [name] Kitchen VAGINAL HYSTERECTOMY  1971     OB History   No obstetric history on file.      Home Medications    Prior to Admission medications   Medication Sig Start Date End Date Taking? Authorizing Provider  allopurinol (ZYLOPRIM) 300 MG tablet Take 300 mg by mouth daily. 06/09/16  Yes [provider]  amLODipine (NORVASC) 5 MG tablet Take 5 mg by mouth daily. 04/24/18  Yes [provider]  brimonidine (ALPHAGAN) 0.2 % ophthalmic solution 1 drop 2 (two) times daily.  03/20/18   Yes [provider]  celecoxib (CELEBREX) 100 MG capsule Take 100 mg by mouth daily.  02/21/18  Yes [provider]  cetirizine (ZYRTEC) 10 MG tablet Take 10 mg by mouth daily.   Yes [provider]  CRANBERRY CONCENTRATE PO Take 1 tablet by mouth daily.   Yes [provider]  dorzolamide-timolol (COSOPT) 22.3-6.8 MG/ML ophthalmic solution Place 1 drop into both eyes 2 (two) times daily.  06/28/13  Yes [provider]  furosemide (LASIX) 20 MG tablet TAKE (1/2) TABLET DAILY. 12/10/18  Yes Martinique, Peter M, MD  HYDROcodone-acetaminophen (NORCO/VICODIN) 5-325 MG tablet Take 0.5-1 tablets by mouth 3 (three) times daily as needed for pain.  06/10/18  Yes [provider]  ipratropium (ATROVENT) 0.06 % nasal spray Place 1 spray into both nostrils daily as needed (allergies).  04/13/18  Yes [provider]  LUMIGAN 0.01 % SOLN Place 1 drop into both eyes at bedtime. 09/04/16  Yes [provider]  mupirocin ointment (BACTROBAN) 2 % Apply 1 application topically 2 (two) times daily. 04/22/17  Yes Trula Slade, DPM  nebivolol (BYSTOLIC) 10 MG tablet Take 1 tablet (10 mg total) by mouth at bedtime. 05/19/18  Yes [place], Peter M, MD  traZODone (DESYREL) 50 MG tablet Take 25 mg by mouth at bedtime.  08/09/16  Yes [provider]  warfarin (COUMADIN) 2.5 MG tablet TAKE 1 TO 1&1/2 TABLETS DAILY AS DIRECTED BY COUMADIN CLINIC. Patient taking differently: Take 2.5-3.75 mg by mouth as directed. TAKE 1.5 tablets (3.75 mg) on (MWF) & Take 1 tablet (2.5 mg) all other days. 06/09/18  Yes [place], Peter M, MD  cimetidine (TAGAMET) 200 MG tablet Take 200 mg by mouth 2 (two) times daily.    [provider]  FLUZONE HIGH-DOSE QUADRIVALENT 0.7 ML SUSY ADM 0.7ML IM UTD 10/15/18   [provider]  potassium chloride (K-DUR) 10 MEQ tablet Take 2 tablets (20 mEq total) by mouth daily for 30 days. MWF 07/16/18 08/15/18  Arrien, Jimmy Picket, MD   potassium chloride (KLOR-CON) 10 MEQ tablet  10/08/18   [provider]  silver sulfADIAZINE (SILVADENE) 1 % cream Apply 1 application topically daily. 12/12/15   Tuchman, Richard Loletha Grayer, DPM  triamcinolone (NASACORT ALLERGY 24HR) 55 MCG/ACT AERO nasal inhaler Place 2 sprays into the nose daily as needed (allergies).     [provider]    Family History Family History  Problem Relation Age of Onset  . Heart disease Father        Heart Disease before age 15  . Heart attack Father 55  . Hypertension Father   . Peripheral vascular disease Father        Right  leg amputation  . Heart disease Mother        AAA  . Stroke Mother   . Aneurysm Mother   . Hypertension Mother   . AAA (abdominal aortic aneurysm) Mother   . Diabetes Paternal Grandfather   . Hypertension Brother   . Heart disease Brother        Heart Disease before age 82  . Hyperlipidemia Brother   . Heart attack Brother   . Hypertension Brother   . Diabetes Son     Social History Social History   Tobacco Use  . Smoking status: Former Smoker    Packs/day: 0.50    Years: 15.00    Pack years: 7.50    Types: Cigarettes    Quit date: 02/11/1986    Years since quitting: 32.8  . Smokeless tobacco: Never Used  Substance Use Topics  . Alcohol use: Yes    Alcohol/week: 0.0 standard drinks    Comment: 05/14/2012 "have 2-3 drinks/yr" maybe  . Drug use: No     Allergies   Codeine, Cymbalta [duloxetine hcl], Diltiazem cd [diltiazem hcl er beads], Duloxetine, Metoprolol, Nortriptyline, Penicillins, Statins, Tetanus toxoid adsorbed, Tetanus toxoids, Ciprofloxacin, Lyrica [pregabalin], and Penicillin g   Review of Systems Review of Systems  Respiratory: Positive for chest tightness and shortness of breath.   Cardiovascular: Positive for leg swelling.  All other systems reviewed and are negative.    Physical Exam Updated Vital Signs BP (!) 162/89   Pulse (!) 114   Temp 98.6 F (37 C) (Oral)   Resp (!)  26   SpO2 96%   Physical Exam Vitals signs and nursing note reviewed.  Constitutional:      General: She is not in acute distress.  Appearance: Normal appearance. She is well-developed.  HENT:     Head: Normocephalic and atraumatic.     Right Ear: Hearing normal.     Left Ear: Hearing normal.     Nose: Nose normal.  Eyes:     Conjunctiva/sclera: Conjunctivae normal.     Pupils: Pupils are equal, round, and reactive to light.  Neck:     Musculoskeletal: Normal range of motion and neck supple.  Cardiovascular:     Rate and Rhythm: Regular rhythm.     Heart sounds: S1 normal and S2 normal. No murmur. No friction rub. No gallop.   Pulmonary:     Effort: Pulmonary effort is normal. No respiratory distress.     Breath sounds: Rales present.  Chest:     Chest wall: No tenderness.  Abdominal:     General: Bowel sounds are normal.     Palpations: Abdomen is soft.     Tenderness: There is no abdominal tenderness. There is no guarding or rebound. Negative signs include Murphy's sign and McBurney's sign.     Hernia: No hernia is present.  Musculoskeletal: Normal range of motion.     Right lower leg: Edema present.     Left lower leg: Edema present.  Skin:    General: Skin is warm and dry.     Findings: No rash.  Neurological:     Mental Status: She is alert and oriented to person, place, and time.     GCS: GCS eye subscore is 4. GCS verbal subscore is 5. GCS motor subscore is 6.     Cranial Nerves: No cranial nerve deficit.     Sensory: No sensory deficit.     Coordination: Coordination normal.  Psychiatric:        Speech: Speech normal.        Behavior: Behavior normal.        Thought Content: Thought content normal.      ED Treatments / Results  Labs (all labs ordered are listed, but only abnormal results are displayed) Labs Reviewed  CBC WITH DIFFERENTIAL/PLATELET - Abnormal; Notable for the following components:      Result Value   WBC 10.7 (*)    RDW 16.6 (*)     Platelets 129 (*)    Neutro Abs 9.0 (*)    Abs Immature Granulocytes 0.10 (*)    All other components within normal limits  BASIC METABOLIC PANEL - Abnormal; Notable for the following components:   Glucose, Bld 160 (*)    Calcium 8.5 (*)    All other components within normal limits  BRAIN NATRIURETIC PEPTIDE - Abnormal; Notable for the following components:   B Natriuretic Peptide 300.3 (*)    All other components within normal limits  SARS CORONAVIRUS 2 (TAT 6-24 HRS)  LACTIC ACID, PLASMA  PROTIME-INR  TROPONIN I (HIGH SENSITIVITY)  TROPONIN I (HIGH SENSITIVITY)    EKG EKG Interpretation  Date/Time:  Sunday December 27 2018 03:31:17 EST Ventricular Rate:  110 PR Interval:    QRS Duration: 84 QT Interval:  326 QTC Calculation: 441 R Axis:   106 Text Interpretation: Atrial fibrillation Right axis deviation Nonspecific repol abnormality, diffuse leads No significant change since last tracing Confirmed by Orpah Greek ([id number]) on 12/27/2018 3:39:15 AM   Radiology Dg Chest Port 1 View  Result Date: 12/27/2018 CLINICAL DATA:  Shortness of breath EXAM: PORTABLE CHEST 1 VIEW COMPARISON:  09/08/2018 FINDINGS: Cardiomegaly with vascular congestion and diffuse interstitial opacities compatible with edema. Small  bilateral effusions. Left base atelectasis or infiltrate. IMPRESSION: Cardiomegaly, mild pulmonary edema. Small bilateral effusions. Left lower lobe atelectasis or infiltrate. Electronically Signed   By: Rolm Baptise M.D.   On: 12/27/2018 03:52    Procedures Procedures (including critical care time)  Medications Ordered in ED Medications  cefTRIAXone (ROCEPHIN) 1 g in sodium chloride 0.9 % 100 mL IVPB (has no administration in time range)  azithromycin (ZITHROMAX) 500 mg in sodium chloride 0.9 % 250 mL IVPB (has no administration in time range)  furosemide (LASIX) injection 40 mg (40 mg Intravenous Given 12/27/18 0432)     Initial Impression / Assessment and  Plan / ED Course  I have reviewed the triage vital signs and the nursing notes.  Pertinent labs & imaging results that were available during my care of the patient were reviewed by me and considered in my medical decision making (see chart for details).        Patient presents to the emergency department for evaluation of shortness of breath.  Patient reports that she feels very short of breath tonight.  She has had some mild cough and congestion and thinks she might of had a fever earlier today but was afebrile at arrival to the ER.  Patient reports a history of congestive heart failure, generally only requires oxygen when ambulatory.  She is short of breath at rest.  She appears dyspneic during the interview.  She has had a small weight gain and does have increased edema of her lower extremities compared to her baseline.  Chest x-ray suspicious for edema but also possible infiltrate.  Based on her stated history of cough and possible fever, will treat for pneumonia, Covid pending.  Additionally she does have evidence of volume overload and was given Lasix.  Will require hospitalization for further management.  Final Clinical Impressions(s) / ED Diagnoses   Final diagnoses:  Acute on chronic congestive heart failure, unspecified heart failure type [facility])    ED Discharge Orders    None       Orpah Greek, MD 12/27/18 [id number]

## 2018-12-27 NOTE — Progress Notes (Signed)
[facility] for Warfarin Indication: atrial fibrillation  Allergies  Allergen Reactions  . Codeine     NAUSEA  . Cymbalta [Duloxetine Hcl]     Nausea VOMITING AND ABDOMINAL PAIN, HEADACHE, JUST ABOUT EVERY SIDE EFFECT THE DRUG HAS  . Diltiazem Cd [Diltiazem Hcl Er Beads]     Heavy legs, cough  . Duloxetine     Other reaction(s): Other (See Comments) Nausea VOMITING AND ABDOMINAL PAIN, HEADACHE, JUST ABOUT EVERY SIDE EFFECT THE DRUG HAS  . Metoprolol     Legs like cement  . Nortriptyline     Nightmares  . Penicillins     RASH  & ITCHING   --PT STATES SHE CAN TAKE KEFLEX PO AND IV CEPHALOSPORINS  . Statins     Leg cramps  . Tetanus Toxoid Adsorbed Swelling  . Tetanus Toxoids     SWELLING, REDNESS  WHOLE ARM  . Ciprofloxacin Rash    RASH  . Lyrica [Pregabalin] Diarrhea, Nausea And Vomiting and Rash  . Penicillin G Rash    Patient Measurements: Height: 5' 2"  (157.5 cm) Weight: 202 lb 13.2 oz (92 kg) IBW/kg (Calculated) : 50.1 wt = 92 kg  Vital Signs: Temp: 98.6 F (37 C) (11/15 0331) Temp Source: Oral (11/15 0331) BP: 158/97 (11/15 0600) Pulse Rate: 103 (11/15 0600)  Labs: Recent Labs    12/27/18 0340 12/27/18 0540  HGB 12.7  --   HCT 42.4  --   PLT 129*  --   LABPROT  --  26.8*  INR  --  2.5*  CREATININE 0.74  --   TROPONINIHS 8 9    Estimated Creatinine Clearance: 57.3 mL/min (by C-G formula based on SCr of 0.74 mg/dL).  Medications:  Medications Prior to Admission  Medication Sig Dispense Refill  . allopurinol (ZYLOPRIM) 300 MG tablet Take 300 mg by mouth daily.    [name] Kitchen amLODipine (NORVASC) 5 MG tablet Take 5 mg by mouth daily.    . brimonidine (ALPHAGAN) 0.2 % ophthalmic solution 1 drop 2 (two) times daily.     . celecoxib (CELEBREX) 100 MG capsule Take 100 mg by mouth daily.     . cetirizine (ZYRTEC) 10 MG tablet Take 10 mg by mouth daily.    [name] Kitchen CRANBERRY CONCENTRATE PO Take 1 tablet by mouth daily.    .  dorzolamide-timolol (COSOPT) 22.3-6.8 MG/ML ophthalmic solution Place 1 drop into both eyes 2 (two) times daily.     . furosemide (LASIX) 20 MG tablet TAKE (1/2) TABLET DAILY. 45 tablet 1  . HYDROcodone-acetaminophen (NORCO/VICODIN) 5-325 MG tablet Take 0.5-1 tablets by mouth 3 (three) times daily as needed for pain.     [name] Kitchen ipratropium (ATROVENT) 0.06 % nasal spray Place 1 spray into both nostrils daily as needed (allergies).     [name] Kitchen LUMIGAN 0.01 % SOLN Place 1 drop into both eyes at bedtime.    . mupirocin ointment (BACTROBAN) 2 % Apply 1 application topically 2 (two) times daily. 30 g 2  . nebivolol (BYSTOLIC) 10 MG tablet Take 1 tablet (10 mg total) by mouth at bedtime. 90 tablet 3  . traZODone (DESYREL) 50 MG tablet Take 25 mg by mouth at bedtime.     [name] Kitchen warfarin (COUMADIN) 2.5 MG tablet TAKE 1 TO 1&1/2 TABLETS DAILY AS DIRECTED BY COUMADIN CLINIC. (Patient taking differently: Take 2.5-3.75 mg by mouth as directed. TAKE 1.5 tablets (3.75 mg) on (MWF) & Take 1 tablet (2.5 mg) all other days.) 120 tablet 1  .  cimetidine (TAGAMET) 200 MG tablet Take 200 mg by mouth 2 (two) times daily.    [name] Kitchen FLUZONE HIGH-DOSE QUADRIVALENT 0.7 ML SUSY ADM 0.7ML IM UTD    . potassium chloride (K-DUR) 10 MEQ tablet Take 2 tablets (20 mEq total) by mouth daily for 30 days. MWF 60 tablet 0  . potassium chloride (KLOR-CON) 10 MEQ tablet     . silver sulfADIAZINE (SILVADENE) 1 % cream Apply 1 application topically daily. 50 g 0  . triamcinolone (NASACORT ALLERGY 24HR) 55 MCG/ACT AERO nasal inhaler Place 2 sprays into the nose daily as needed (allergies).       Scheduled:  . allopurinol  300 mg Oral Daily  . amLODipine  5 mg Oral Daily  . brimonidine  1 drop Both Eyes BID  . dorzolamide-timolol  1 drop Both Eyes BID  . famotidine  10 mg Oral BID  . furosemide  20 mg Intravenous Daily  . latanoprost  1 drop Both Eyes QHS  . loratadine  10 mg Oral Daily  . mouth rinse  15 mL Mouth Rinse BID  . nebivolol  10 mg Oral Once   . nebivolol  10 mg Oral QHS  . potassium chloride  20 mEq Oral Daily  . traZODone  25 mg Oral QHS   Infusions:  . [START ON 12/28/2018] azithromycin    . [START ON 12/28/2018] cefTRIAXone (ROCEPHIN)  IV      Assessment: 59 yoF with Crohn's dz, HTN, HLD, carotid stenosis, PAF and Hx PE on warfarin, dCHF, admitted for CAP, AECHF, and AFib w/ RVR; COVID PUI. Pharmacy to dose warfarin while admitted.   Baseline INR therapeutic  Prior anticoagulation: warfarin 2.5 mg daily except 3.75 mg MWF; LD 11/14  Significant events:  Today, 12/27/2018:  CBC: Hgb WNL, Plt slightly low  INR therapeutic  Major drug interactions: broad-spectrum abx  No bleeding issues per nursing  Diet ordered, intake not charted  Goal of Therapy: INR 2-3  Plan:  Warfarin 2.5 mg PO tonight at 18:00; consider maintaining 2.5 mg daily while acutely ill unless INR trend suggests otherwise  Daily INR  CBC at least q72 hr while on warfarin  Monitor for signs of bleeding or thrombosis   Reuel Boom, PharmD, BCPS [phone] 12/27/2018, 12:30 PM

## 2018-12-27 NOTE — ED Notes (Signed)
Provider at bedside

## 2018-12-27 NOTE — H&P (Signed)
TRH H&P    Patient Demographics:    [name], is a 84 y.o. female  MRN: [id number]  DOB - July 24, 1934  Admit Date - 12/27/2018  Referring MD/NP/PA:  Malachy Moan  Outpatient Primary MD for the patient is Deland Pretty, MD  Patient coming from:  home  Chief complaint-  Dyspnea, cough, fever   HPI:    [name]  is a 84 y.o. female, w Crohn's disease, Gerd, PUD, hypertension, hyperlipidemia, Carotid stenosis, Pafib, CHF (diastolic), OSA on Cpap, h/o PE, presents with c/o fever 100.5, and cough w yellow sputum and sob starting yesterday. Pt thinks gained 1.5 lbs.  Slight swelling of her ankles,  Left > right.  Pt denies cp, palp, orthopnea, pnd.  N/v, abd pain, diarrhea, brbpr.  Pt denies any covid-19 exposure   In ED,  T 98.6, P 106, R 26, Bp 182/89  Pox 98% on RA  Wbc 10.7, Hgb 12.7, Plt 129 Na 142, K 4.0, Bun 12, Creatinine 0.74  Trop 8 BNP 300  EKG Afib at 110, RAD, poor R progression, T inversion in 2,3,avf, v5,6    CXR IMPRESSION: Cardiomegaly, mild pulmonary edema.  Small bilateral effusions.  Left lower lobe atelectasis or infiltrate.  Pt will be admitted for CAP and Afib with RVR, acute diastolic CHF   Review of systems:    In addition to the HPI above,    No Headache, No changes with Vision or hearing, No problems swallowing food or Liquids, No Chest pain No Abdominal pain, No Nausea or Vomiting, bowel movements are regular, No Blood in stool or Urine, No dysuria, No new skin rashes or bruises, No new joints pains-aches,  No new weakness, tingling, numbness in any extremity, No recent weight gain or loss, No polyuria, polydypsia or polyphagia, No significant Mental Stressors.  All other systems reviewed and are negative.    Past History of the following :    Past Medical History:  Diagnosis Date   Atrial fibrillation ([place])    Carotid artery occlusion      Chronic diastolic CHF (congestive heart failure) (HCC)    Hypertensive heart disease 02-12-14   Crohn's disease ([place])    Detached retina    Fibromyalgia    GERD (gastroesophageal reflux disease)    Gout    H/O hiatal hernia    High triglycerides    History of stomach ulcers 1950's   Hypertension    Long term current use of anticoagulant    Obstructive sleep apnea on CPAP    Osteoarthritis    PAIN AND OA LEF T HIP AND BOTH SHOULDERS ARE BONE ON BONE AND PAINFUL   Peripheral vascular disease (HCC)    KNOWN RIGHT INTERNAL CAROTID ARTERY OCCLUSION (NO STROKE)  --40 TO 59% STENOSIS LEFT ICA-FOLLOWED BY DR. EARLY WITH DOPPLER STUDY EVERY 6 MONTHS   Pulmonary embolism ([place]) 2011   a. Hx of PE in 02/2009 after R hip surgery, venous dopplers negative, long-term Coumadin.   PVC (premature ventricular contraction)    PT STATES HX OF PVC'S  ON EKG   Recurrent upper respiratory infection (URI)    BRONCHITIS FEB 2013--SLIGHT COUGH NON-PRODUCTIVE NOW   Recurrent UTI (urinary tract infection)    "on daily medicine" (05/14/2012)   Tinnitus    Vertigo       Past Surgical History:  Procedure Laterality Date   APPENDECTOMY  1950's   BACK SURGERY  10/29/06   Central and foraminal decompression L3-L4, L4-L5, and L5-S1 with inspection of L4-L5 and L5-S1 disc on the right   CARDIAC CATHETERIZATION  1970's   "maybe 2" (05/14/2012)   CARDIAC CATHETERIZATION     CATARACT EXTRACTION W/ INTRAOCULAR LENS IMPLANT Right 2009   CHOLECYSTECTOMY  ~ [place] LEVEL 3  ~ 2002   DILATION AND CURETTAGE OF UTERUS     "several; from miscarriages" (05/14/2012)   EXCISION MORTON'S NEUROMA Right 05/20/00   "foot" (05/14/2012)   EYE SURGERY Left 2014   Detached retina   EYE SURGERY Left August 23, 2013   Cataract   FEMUR FRACTURE SURGERY Right 02/21/09   Open reduction internal fixation of right periprosthetic  femur fracture utilizing Zimmer cables times fiv    FRACTURE SURGERY  2011   Right Femur Fx   HAMMER TOE SURGERY Left 10/25/08   "toe next to big to" (05/14/2012)   JOINT REPLACEMENT  2009   Right Hip replacement   JOINT REPLACEMENT  2012   Right knee replacement   JOINT REPLACEMENT  06/17/11   Left Hip replacement   KNEE ARTHROSCOPY Right 07/26/05   REPLACEMENT TOTAL KNEE Right 02/19/2010   [place]?   TOTAL HIP ARTHROPLASTY Right 12/08/02   Osteonics total hip replacement   TOTAL HIP ARTHROPLASTY  06/17/2011   Procedure: TOTAL HIP ARTHROPLASTY;  Surgeon: Gearlean Alf, MD;  Location: [facility];  Service: Orthopedics;  Laterality: Left;   TOTAL SHOULDER ARTHROPLASTY Left 05/14/2012   TOTAL SHOULDER ARTHROPLASTY Left 05/14/2012   Procedure: TOTAL SHOULDER ARTHROPLASTY;  Surgeon: Marin Shutter, MD;  Location: [facility];  Service: Orthopedics;  Laterality: Left;   VAGINAL HYSTERECTOMY  1971      Social History:      Social History   Tobacco Use   Smoking status: Former Smoker    Packs/day: 0.50    Years: 15.00    Pack years: 7.50    Types: Cigarettes    Quit date: 02/11/1986    Years since quitting: 32.8   Smokeless tobacco: Never Used  Substance Use Topics   Alcohol use: Yes    Alcohol/week: 0.0 standard drinks    Comment: 05/14/2012 "have 2-3 drinks/yr" maybe       Family History :     Family History  Problem Relation Age of Onset   Heart disease Father        Heart Disease before age 52   Heart attack Father 35   Hypertension Father    Peripheral vascular disease Father        Right  leg amputation   Heart disease Mother        AAA   Stroke Mother    Aneurysm Mother    Hypertension Mother    AAA (abdominal aortic aneurysm) Mother    Diabetes Paternal Grandfather    Hypertension Brother    Heart disease Brother        Heart Disease before age 72   Hyperlipidemia Brother    Heart attack Brother    Hypertension  Brother    Diabetes Son         Home Medications:   Prior to Admission medications   Medication Sig Start Date End Date Taking? Authorizing Provider  allopurinol (ZYLOPRIM) 300 MG tablet Take 300 mg by mouth daily. 06/09/16  Yes [provider]  amLODipine (NORVASC) 5 MG tablet Take 5 mg by mouth daily. 04/24/18  Yes [provider]  brimonidine (ALPHAGAN) 0.2 % ophthalmic solution 1 drop 2 (two) times daily.  03/20/18  Yes [provider]  celecoxib (CELEBREX) 100 MG capsule Take 100 mg by mouth daily.  02/21/18  Yes [provider]  cetirizine (ZYRTEC) 10 MG tablet Take 10 mg by mouth daily.   Yes [provider]  CRANBERRY CONCENTRATE PO Take 1 tablet by mouth daily.   Yes [provider]  dorzolamide-timolol (COSOPT) 22.3-6.8 MG/ML ophthalmic solution Place 1 drop into both eyes 2 (two) times daily.  06/28/13  Yes [provider]  furosemide (LASIX) 20 MG tablet TAKE (1/2) TABLET DAILY. 12/10/18  Yes [place]ique, Peter M, MD  HYDROcodone-acetaminophen (NORCO/VICODIN) 5-325 MG tablet Take 0.5-1 tablets by mouth 3 (three) times daily as needed for pain.  06/10/18  Yes [provider]  ipratropium (ATROVENT) 0.06 % nasal spray Place 1 spray into both nostrils daily as needed (allergies).  04/13/18  Yes [provider]  LUMIGAN 0.01 % SOLN Place 1 drop into both eyes at bedtime. 09/04/16  Yes [provider]  mupirocin ointment (BACTROBAN) 2 % Apply 1 application topically 2 (two) times daily. 04/22/17  Yes Trula Slade, DPM  nebivolol (BYSTOLIC) 10 MG tablet Take 1 tablet (10 mg total) by mouth at bedtime. 05/19/18  Yes [place], Peter M, MD  traZODone (DESYREL) 50 MG tablet Take 25 mg by mouth at bedtime.  08/09/16  Yes [provider]  warfarin (COUMADIN) 2.5 MG tablet TAKE 1 TO 1&1/2 TABLETS DAILY AS DIRECTED BY COUMADIN CLINIC. Patient taking differently: Take 2.5-3.75 mg by mouth as directed. TAKE 1.5 tablets (3.75 mg) on (MWF) & Take  1 tablet (2.5 mg) all other days. 06/09/18  Yes [place], Peter M, MD  cimetidine (TAGAMET) 200 MG tablet Take 200 mg by mouth 2 (two) times daily.    [provider]  FLUZONE HIGH-DOSE QUADRIVALENT 0.7 ML SUSY ADM 0.7ML IM UTD 10/15/18   [provider]  potassium chloride (K-DUR) 10 MEQ tablet Take 2 tablets (20 mEq total) by mouth daily for 30 days. MWF 07/16/18 08/15/18  Arrien, Jimmy Picket, MD  potassium chloride (KLOR-CON) 10 MEQ tablet  10/08/18   [provider]  silver sulfADIAZINE (SILVADENE) 1 % cream Apply 1 application topically daily. 12/12/15   Tuchman, Richard Loletha Grayer, DPM  triamcinolone (NASACORT ALLERGY 24HR) 55 MCG/ACT AERO nasal inhaler Place 2 sprays into the nose daily as needed (allergies).     [provider]     Allergies:     Allergies  Allergen Reactions   Codeine     NAUSEA   Cymbalta [Duloxetine Hcl]     Nausea VOMITING AND ABDOMINAL PAIN, HEADACHE, JUST ABOUT EVERY SIDE EFFECT THE DRUG HAS   Diltiazem Cd [Diltiazem Hcl Er Beads]     Heavy legs, cough   Duloxetine     Other reaction(s): Other (See Comments) Nausea VOMITING AND ABDOMINAL PAIN, HEADACHE, JUST ABOUT EVERY SIDE EFFECT THE DRUG HAS   Metoprolol     Legs like cement   Nortriptyline     Nightmares   Penicillins  RASH  & ITCHING   --PT STATES SHE CAN TAKE KEFLEX PO AND IV CEPHALOSPORINS   Statins     Leg cramps   Tetanus Toxoid Adsorbed Swelling   Tetanus Toxoids     SWELLING, REDNESS  WHOLE ARM   Ciprofloxacin Rash    RASH   Lyrica [Pregabalin] Diarrhea, Nausea And Vomiting and Rash   Penicillin G Rash     Physical Exam:   Vitals  Blood pressure (!) 162/89, pulse (!) 114, temperature 98.6 F (37 C), temperature source Oral, resp. rate (!) 26, SpO2 96 %.  1.  General: axoxo3  2. Psychiatric: euthymic  3. Neurologic: cn2-12 intact, reflexes 2+ symmetric, diffuse with  No clonus, motor 5/5 in all 4 ext  4. HEENMT:  Anicteric, pupils  1.5m symmetric, direct, consensual intact Neck: + jvd  5. Respiratory : Crackles at the left base, trace crackles at the right lung base,  no wheezing  6. Cardiovascular : rrr s1, s2, no m/g/r  7. Gastrointestinal:  Abd: soft, morbidly obese, nt, nd, +bs  8. Skin:  Ext: no c/c/e, no rash  9.Musculoskeletal:  Good ROM    Data Review:    CBC Recent Labs  Lab 12/27/18 0340  WBC 10.7*  HGB 12.7  HCT 42.4  PLT 129*  MCV 93.4  MCH 28.0  MCHC 30.0  RDW 16.6*  LYMPHSABS 0.8  MONOABS 0.6  EOSABS 0.1  BASOSABS 0.1   ------------------------------------------------------------------------------------------------------------------  Results for orders placed or performed during the hospital encounter of 12/27/18 (from the past 48 hour(s))  CBC with Differential     Status: Abnormal   Collection Time: 12/27/18  3:40 AM  Result Value Ref Range   WBC 10.7 (H) 4.0 - 10.5 K/uL   RBC 4.54 3.87 - 5.11 MIL/uL   Hemoglobin 12.7 12.0 - 15.0 g/dL   HCT 42.4 36.0 - 46.0 %   MCV 93.4 80.0 - 100.0 fL   MCH 28.0 26.0 - 34.0 pg   MCHC 30.0 30.0 - 36.0 g/dL   RDW 16.6 (H) 11.5 - 15.5 %   Platelets 129 (L) 150 - 400 K/uL   nRBC 0.0 0.0 - 0.2 %   Neutrophils Relative % 84 %   Neutro Abs 9.0 (H) 1.7 - 7.7 K/uL   Lymphocytes Relative 7 %   Lymphs Abs 0.8 0.7 - 4.0 K/uL   Monocytes Relative 6 %   Monocytes Absolute 0.6 0.1 - 1.0 K/uL   Eosinophils Relative 1 %   Eosinophils Absolute 0.1 0.0 - 0.5 K/uL   Basophils Relative 1 %   Basophils Absolute 0.1 0.0 - 0.1 K/uL   Immature Granulocytes 1 %   Abs Immature Granulocytes 0.10 (H) 0.00 - 0.07 K/uL    Comment: [facility] [address] [place]  [id number] Basic metabolic panel     Status: Abnormal   Collection Time: 12/27/18  3:40 AM  Result Value Ref Range   Sodium 142 135 - 145 mmol/L   Potassium 4.0 3.5 - 5.1 mmol/L   Chloride 106 98 - 111 mmol/L   CO2 24 22 - 32 mmol/L   Glucose,  Bld 160 (H) 70 - 99 mg/dL   BUN 12 8 - 23 mg/dL   Creatinine, Ser 0.74 0.44 - 1.00 mg/dL   Calcium 8.5 (L) 8.9 - 10.3 mg/dL   GFR calc non Af Amer >60 >60 mL/min   GFR calc Af Amer >60 >60 mL/min   Anion gap 12  5 - 15    Comment: Performed at [facility], [place] [address], [place], [place] [address]  Troponin I (High Sensitivity)     Status: None   Collection Time: 12/27/18  3:40 AM  Result Value Ref Range   Troponin I (High Sensitivity) 8 <18 ng/L    Comment: (NOTE) Elevated high sensitivity troponin I (hsTnI) values and significant  changes across serial measurements may suggest ACS but many other  chronic and acute conditions are known to elevate hsTnI results.  Refer to the "Links" section for chest pain algorithms and additional  guidance. Performed at [facility], [place] [address], [place], [place] [address]   Brain natriuretic peptide     Status: Abnormal   Collection Time: 12/27/18  3:40 AM  Result Value Ref Range   B Natriuretic Peptide 300.3 (H) 0.0 - 100.0 pg/mL    Comment: Performed at [facility], [place] [address], [place], [place] [address]  Lactic acid, plasma     Status: None   Collection Time: 12/27/18  3:42 AM  Result Value Ref Range   Lactic Acid, Venous 1.5 0.5 - 1.9 mmol/L    Comment: Performed at [facility], [place] [address], [place], [place] [address]    Chemistries  Recent Labs  Lab 12/27/18 0340  NA 142  K 4.0  CL 106  CO2 24  GLUCOSE 160*  BUN 12  CREATININE 0.74  CALCIUM 8.5*   ------------------------------------------------------------------------------------------------------------------  ------------------------------------------------------------------------------------------------------------------ GFR: Estimated Creatinine Clearance: 57.3 mL/min (by C-G formula based on SCr of 0.74 mg/dL). Liver Function Tests: No results for input(s): AST, ALT, ALKPHOS,  BILITOT, PROT, ALBUMIN in the last 168 hours. No results for input(s): LIPASE, AMYLASE in the last 168 hours. No results for input(s): AMMONIA in the last 168 hours. Coagulation Profile: No results for input(s): INR, PROTIME in the last 168 hours. Cardiac Enzymes: No results for input(s): CKTOTAL, CKMB, CKMBINDEX, TROPONINI in the last 168 hours. BNP (last 3 results) No results for input(s): PROBNP in the last 8760 hours. HbA1C: No results for input(s): HGBA1C in the last 72 hours. CBG: No results for input(s): GLUCAP in the last 168 hours. Lipid Profile: No results for input(s): CHOL, HDL, LDLCALC, TRIG, CHOLHDL, LDLDIRECT in the last 72 hours. Thyroid Function Tests: No results for input(s): TSH, T4TOTAL, FREET4, T3FREE, THYROIDAB in the last 72 hours. Anemia Panel: No results for input(s): VITAMINB12, FOLATE, FERRITIN, TIBC, IRON, RETICCTPCT in the last 72 hours.  --------------------------------------------------------------------------------------------------------------- Urine analysis:    Component Value Date/Time   COLORURINE YELLOW 09/08/2018 1711   APPEARANCEUR CLEAR 09/08/2018 1711   LABSPEC 1.010 09/08/2018 1711   PHURINE 6.0 09/08/2018 1711   GLUCOSEU NEGATIVE 09/08/2018 1711   HGBUR SMALL (A) 09/08/2018 1711   BILIRUBINUR NEGATIVE 09/08/2018 1711   KETONESUR NEGATIVE 09/08/2018 1711   PROTEINUR 30 (A) 09/08/2018 1711   UROBILINOGEN 0.2 04/02/2013 0554   NITRITE NEGATIVE 09/08/2018 1711   LEUKOCYTESUR SMALL (A) 09/08/2018 1711      Imaging Results:    Dg Chest Port 1 View  Result Date: 12/27/2018 CLINICAL DATA:  Shortness of breath EXAM: PORTABLE CHEST 1 VIEW COMPARISON:  09/08/2018 FINDINGS: Cardiomegaly with vascular congestion and diffuse interstitial opacities compatible with edema. Small bilateral effusions. Left base atelectasis or infiltrate. IMPRESSION: Cardiomegaly, mild pulmonary edema. Small bilateral effusions. Left lower lobe atelectasis or  infiltrate. Electronically Signed   By: Rolm Baptise M.D.   On: 12/27/2018 03:52  Assessment & Plan:    Principal Problem:   Acute on chronic diastolic congestive heart failure (HCC) Active Problems:   Atrial fibrillation with RVR (HCC)   Chronic atrial fibrillation (HCC)   CAP (community acquired pneumonia)  Acute on Chronic Diastolic CHF (EF 37%) Cont Bystolic 04UG po qday Cont Kcl 98mq po qday Lasix 286miv qday  Afib with RVR Trop I Check TSH Check cardiac echo Extra dose of Bystolic 1089VQo x1, since allerghic to metoprolol, and cardizem.  Cont Bystolic as above Coumadin pharmacy to dose  CAP probably the trigger of Afib w RVR/ Acute CHF Blood culture x2 Urine legionella antigen Urine strep antigen Covid -19 pending, pt is PUI, please follow up on result Rocephin 1gm iv qday , Zithromax 50041mv qday  Hypertension Cont Bystolic Cont Amlodipine 5mg33m qday  Gout Cont Allopurinol 300mg5mqday  Glaucoma Cont Alphagan Cont Cosopt  Allerghic Rhinitis Cont Zyrtec-> Claritin Cont Atrovent nasal spray Cont Nasacort   Gerd/ PUD Cont Tagamet  OSA Cpap    DVT Prophylaxis-   Coumadin - SCDs   AM Labs Ordered, also please review Full Orders  Family Communication: Admission, patients condition and plan of care including tests being ordered have been discussed with the patient  who indicate understanding and agree with the plan and Code Status.  Code Status:  FULL CODE per patient  Admission status:  Inpatient: Based on patients clinical presentation and evaluation of above clinical data, I have made determination that patient meets Inpatient criteria at this time.  Pt has high risk of clinical deterioration, pt will require iv abx, for CAP, iv lasix for acute chf,  and possibly iv medication for Afib with RVR. Pt will require > 2 nites stay.   Time spent in minutes : 70 minutes   JamesJani Gravelon 12/27/2018 at 5:54 AM

## 2018-12-27 NOTE — ED Notes (Signed)
Assisted Pt to to stand at bedside at use urinal. Pt became increasing SHOB and 02 decreased to 88% on 4L. Pt able to return to bed and is currently at 94% on 4L

## 2018-12-27 NOTE — ED Notes (Signed)
X-ray at bedside

## 2018-12-27 NOTE — Progress Notes (Signed)
ANTICOAGULATION CONSULT NOTE - Initial Consult  Pharmacy Consult for Warfarin Indication: atrial fibrillation  Allergies  Allergen Reactions  . Codeine     NAUSEA  . Cymbalta [Duloxetine Hcl]     Nausea VOMITING AND ABDOMINAL PAIN, HEADACHE, JUST ABOUT EVERY SIDE EFFECT THE DRUG HAS  . Diltiazem Cd [Diltiazem Hcl Er Beads]     Heavy legs, cough  . Duloxetine     Other reaction(s): Other (See Comments) Nausea VOMITING AND ABDOMINAL PAIN, HEADACHE, JUST ABOUT EVERY SIDE EFFECT THE DRUG HAS  . Metoprolol     Legs like cement  . Nortriptyline     Nightmares  . Penicillins     RASH  & ITCHING   --PT STATES SHE CAN TAKE KEFLEX PO AND IV CEPHALOSPORINS  . Statins     Leg cramps  . Tetanus Toxoid Adsorbed Swelling  . Tetanus Toxoids     SWELLING, REDNESS  WHOLE ARM  . Ciprofloxacin Rash    RASH  . Lyrica [Pregabalin] Diarrhea, Nausea And Vomiting and Rash  . Penicillin G Rash    Patient Measurements:   wt = 92 kg  Vital Signs: Temp: 98.6 F (37 C) (11/15 0331) Temp Source: Oral (11/15 0331) BP: 158/97 (11/15 0600) Pulse Rate: 103 (11/15 0600)  Labs: Recent Labs    12/27/18 0340  HGB 12.7  HCT 42.4  PLT 129*  CREATININE 0.74  TROPONINIHS 8    Estimated Creatinine Clearance: 57.3 mL/min (by C-G formula based on SCr of 0.74 mg/dL).   Medical History: Past Medical History:  Diagnosis Date  . Atrial fibrillation ([place])   . Carotid artery occlusion   . Chronic diastolic CHF (congestive heart failure) (HCC)    Hypertensive heart disease 02-12-14  . Crohn's disease ([place])   . Detached retina   . Fibromyalgia   . GERD (gastroesophageal reflux disease)   . Gout   . H/O hiatal hernia   . High triglycerides   . History of stomach ulcers 1950's  . Hypertension   . Long term current use of anticoagulant   . Obstructive sleep apnea on CPAP   . Osteoarthritis    PAIN AND OA LEF T HIP AND BOTH SHOULDERS ARE BONE ON BONE AND PAINFUL  . Peripheral vascular disease (HCC)     KNOWN RIGHT INTERNAL CAROTID ARTERY OCCLUSION (NO STROKE)  --40 TO 59% STENOSIS LEFT ICA-FOLLOWED BY DR. EARLY WITH DOPPLER STUDY EVERY 6 MONTHS  . Pulmonary embolism ([place]) 2011   a. Hx of PE in 02/2009 after R hip surgery, venous dopplers negative, long-term Coumadin.  [name] Kitchen PVC (premature ventricular contraction)    PT STATES HX OF PVC'S ON EKG  . Recurrent upper respiratory infection (URI)    BRONCHITIS FEB 2013--SLIGHT COUGH NON-PRODUCTIVE NOW  . Recurrent UTI (urinary tract infection)    "on daily medicine" (05/14/2012)  . Tinnitus   . Vertigo     Medications:  Scheduled:  . enoxaparin (LOVENOX) injection  40 mg Subcutaneous Daily  . furosemide  20 mg Intravenous Daily  . nebivolol  10 mg Oral Once   Infusions:  . azithromycin (ZITHROMAX) 500 MG IVPB (Vial-Mate [employer])    . [START ON 12/28/2018] azithromycin    . cefTRIAXone (ROCEPHIN)  IV 1 g (12/27/18 0603)  . [START ON 12/28/2018] cefTRIAXone (ROCEPHIN)  IV      Assessment: 73 yoF with SOB and chest heaviness on chronic warfarin for a-fib.  HD 3.375 mg M/W/F and 2.5 mg other days. LD 11/14 1800.  Goal  of Therapy:  INR 2-3    Plan:  INR pending  Dorrene German 12/27/2018,6:33 AM

## 2018-12-27 NOTE — Progress Notes (Signed)
  Echocardiogram 2D Echocardiogram has been attempted. Echo canceled per Dr. Avon Gully.  Imanuel Pruiett G Nawal Burling 12/27/2018, 11:23 AM

## 2018-12-27 NOTE — ED Triage Notes (Signed)
BIB EMS from home. Pt complaints of increase Shob of breathing, headache and cough that started today. Pt reports she is normally shob when ambulating however it is worse today.Pt reports fever of 100.5 today but is afebrile at 98.6 at this time. Pt is SHOB at rest.   EMS reports Hx of CHF, Afib

## 2018-12-27 NOTE — ED Notes (Signed)
Unsuccessful attempt at obtaining second troponin and INR. Another RN at bedside for attempt at this time.

## 2018-12-28 LAB — COMPREHENSIVE METABOLIC PANEL
ALT: 12 U/L (ref 0–44)
AST: 12 U/L — ABNORMAL LOW (ref 15–41)
Albumin: 2.8 g/dL — ABNORMAL LOW (ref 3.5–5.0)
Alkaline Phosphatase: 71 U/L (ref 38–126)
Anion gap: 9 (ref 5–15)
BUN: 19 mg/dL (ref 8–23)
CO2: 32 mmol/L (ref 22–32)
Calcium: 8.1 mg/dL — ABNORMAL LOW (ref 8.9–10.3)
Chloride: 104 mmol/L (ref 98–111)
Creatinine, Ser: 0.79 mg/dL (ref 0.44–1.00)
GFR calc Af Amer: >60 mL/min (ref >60)
GFR calc non Af Amer: >60 mL/min (ref >60)
Glucose, Bld: 123 mg/dL — ABNORMAL HIGH (ref 70–99)
Potassium: 3 mmol/L — ABNORMAL LOW (ref 3.5–5.1)
Sodium: 145 mmol/L (ref 135–145)
Total Bilirubin: 1.2 mg/dL (ref 0.3–1.2)
Total Protein: 6.1 g/dL — ABNORMAL LOW (ref 6.5–8.1)

## 2018-12-28 LAB — CBC
HCT: 34.4 % — ABNORMAL LOW (ref 36.0–46.0)
Hemoglobin: 10.6 g/dL — ABNORMAL LOW (ref 12.0–15.0)
MCH: 28.6 pg (ref 26.0–34.0)
MCHC: 30.8 g/dL (ref 30.0–36.0)
MCV: 93 fL (ref 80.0–100.0)
Platelets: 118 10*3/uL — ABNORMAL LOW (ref 150–400)
RBC: 3.7 MIL/uL — ABNORMAL LOW (ref 3.87–5.11)
RDW: 16.6 % — ABNORMAL HIGH (ref 11.5–15.5)
WBC: 6.3 10*3/uL (ref 4.0–10.5)
nRBC: 0 % (ref 0.0–0.2)

## 2018-12-28 LAB — PROTIME-INR
INR: 2.6 — ABNORMAL HIGH (ref 0.8–1.2)
Prothrombin Time: 27.8 seconds — ABNORMAL HIGH (ref 11.4–15.2)

## 2018-12-28 MED ORDER — POTASSIUM CHLORIDE CRYS ER 20 MEQ PO TBCR
40.0000 meq | EXTENDED_RELEASE_TABLET | Freq: Once | ORAL | Status: AC
  Administered 2018-12-28: 40 meq via ORAL
  Filled 2018-12-28: qty 2

## 2018-12-28 MED ORDER — AZITHROMYCIN 250 MG PO TABS
500.0000 mg | ORAL_TABLET | Freq: Every day | ORAL | Status: DC
  Administered 2018-12-28 – 2018-12-31 (×4): 500 mg via ORAL
  Filled 2018-12-28 (×4): qty 2

## 2018-12-28 MED ORDER — WARFARIN SODIUM 2.5 MG PO TABS
2.5000 mg | ORAL_TABLET | Freq: Once | ORAL | Status: AC
  Administered 2018-12-28: 2.5 mg via ORAL
  Filled 2018-12-28: qty 1

## 2018-12-28 NOTE — Progress Notes (Signed)
PROGRESS NOTE    [name]  JHE:[id number] DOB: March 09, 1934 DOA: 12/27/2018 PCP: Deland Pretty, MD   Brief Narrative:  [name]  is a 84 y.o. female, w Crohn's disease, Gerd, PUD, hypertension, hyperlipidemia, Carotid stenosis, Pafib, CHF (diastolic), OSA on Cpap, h/o PE, presents with c/o fever 100.5, and cough w yellow sputum and sob starting yesterday. Pt thinks gained 1.5 lbs.  Slight swelling of her ankles,  Left > right.  Pt denies cp, palp, orthopnea, pnd.  N/v, abd pain, diarrhea, brbpr.  Pt denies any covid-19 exposure  Assessment & Plan:   Principal Problem:   Acute on chronic diastolic congestive heart failure (HCC) Active Problems:   Atrial fibrillation with RVR (HCC)   Chronic atrial fibrillation (HCC)   CAP (community acquired pneumonia)    Acute hypoxic respiratory failure in the setting of acute on Chronic Diastolic CHF (EF 18%), POA Symptomatically improving daily  Continues to require 4 L nasal cannula even at rest-ambulatory O2 screening in the next 24 hours Baseline is 2 L nasal cannula around-the-clock Continue Bystolic 56DJ po qday Continue Kcl 30mq po qday Lasix 220miv qday -continues to diurese quite well - net negative 1L over the past 24 hours  Afib with RVR, resolved; likely provoked given below Trop within normal limits Recent echo June 2011, no need for repeat at this time given improvement Extra dose of Bystolic 1049FWo x1, since allergic to metoprolol, and cardizem.  Cont Bystolic as above Coumadin pharmacy to dose -INR 2.5  CAP probably the trigger of Afib w RVR/ Acute CHF Chest x-ray personally reviewed, questionable right lower lobe infiltrate  Blood culture x2 preliminary negative Urine legionella antigen Urine strep antigen negative Covid -19 negative Rocephin 1gm iv qday , Zithromax 50037mv qday x5d -tentative stop 01/01/19  Hypokalemia, mild  Potassium 3.0, repleted this morning, follow with morning labs  Hypertension  Cont Bystolic Cont Amlodipine 5mg24m qday  Gout Cont Allopurinol 300mg75mqday  Glaucoma Cont Alphagan Cont Cosopt  Allerghic Rhinitis Cont Zyrtec-> Claritin Cont Atrovent nasal spray Cont Nasacort   Gerd/ PUD Cont Tagamet  OSA Cpap  DVT Prophylaxis-   Coumadin - SCDs   Code Status:  FULL CODE per patient  Subjective: No acute issues or events overnight, clinically improving, admits to ongoing shortness of breath weakness fatigue and malaise but moderately improved since yesterday.  Otherwise denies overt nausea, vomiting, diarrhea, constipation, headache, fevers, chills.  Objective: Vitals:   12/27/18 0600 12/27/18 0800 12/27/18 2004 12/28/18 0521  BP: (!) 158/97  (!) 160/90 (!) 141/61  Pulse: (!) 103  (!) 107 75  Resp: (!) 24  20 18   Temp:   99.3 F (37.4 C) 98.8 F (37.1 C)  TempSrc:   Oral Oral  SpO2: 98%  99% 94%  Weight:  92 kg  91.1 kg  Height:  5' 2"  (1.575 m)      Intake/Output Summary (Last 24 hours) at 12/28/2018 0749 Last data filed at 12/28/2018 0200 Gross per 24 hour  Intake 10 ml  Output 1100 ml  Net -1090 ml   Filed Weights   12/27/18 0800 12/28/18 0521  Weight: 92 kg 91.1 kg    Examination: General:  Pleasantly resting in bed, No acute distress. HEENT:  Normocephalic atraumatic.  Sclerae nonicteric, noninjected.  Extraocular movements intact bilaterally. Neck:  Without mass or deformity.  Trachea is midline. Lungs: Bibasilar rales without overt rhonchi or wheeze, markedly diminished breath sounds bilaterally. Heart: Irregularly irregular rate and  rhythm.  Without murmurs, rubs, or gallops. Abdomen:  Soft, nontender, nondistended.  Without guarding or rebound. Extremities: Without cyanosis, clubbing, 1+ edema laterally, or obvious deformity. Vascular:  Dorsalis pedis and posterior tibial pulses palpable bilaterally. Skin:  Warm and dry, no erythema, no ulcerations.   Data Reviewed: I have personally reviewed following labs and  imaging studies  CBC: Recent Labs  Lab 12/27/18 0340 12/28/18 0310  WBC 10.7* 6.3  NEUTROABS 9.0*  --   HGB 12.7 10.6*  HCT 42.4 34.4*  MCV 93.4 93.0  PLT 129* 672*   Basic Metabolic Panel: Recent Labs  Lab 12/27/18 0340 12/28/18 0310  NA 142 145  K 4.0 3.0*  CL 106 104  CO2 24 32  GLUCOSE 160* 123*  BUN 12 19  CREATININE 0.74 0.79  CALCIUM 8.5* 8.1*   GFR: Estimated Creatinine Clearance: 56.9 mL/min (by C-G formula based on SCr of 0.79 mg/dL).  Coagulation Profile: Recent Labs  Lab 12/27/18 0540 12/28/18 0310  INR 2.5* 2.6*   Sepsis Labs: Recent Labs  Lab 12/27/18 0342  LATICACIDVEN 1.5    Radiology Studies: Dg Chest Port 1 View  Result Date: 12/27/2018 CLINICAL DATA:  Shortness of breath EXAM: PORTABLE CHEST 1 VIEW COMPARISON:  09/08/2018 FINDINGS: Cardiomegaly with vascular congestion and diffuse interstitial opacities compatible with edema. Small bilateral effusions. Left base atelectasis or infiltrate. IMPRESSION: Cardiomegaly, mild pulmonary edema. Small bilateral effusions. Left lower lobe atelectasis or infiltrate. Electronically Signed   By: Rolm Baptise M.D.   On: 12/27/2018 03:52   Scheduled Meds: . allopurinol  300 mg Oral Daily  . amLODipine  5 mg Oral Daily  . brimonidine  1 drop Both Eyes BID  . dorzolamide-timolol  1 drop Both Eyes BID  . famotidine  10 mg Oral BID  . furosemide  20 mg Intravenous Daily  . latanoprost  1 drop Both Eyes QHS  . loratadine  10 mg Oral Daily  . mouth rinse  15 mL Mouth Rinse BID  . nebivolol  10 mg Oral Once  . nebivolol  10 mg Oral QHS  . potassium chloride  20 mEq Oral Daily  . traZODone  25 mg Oral QHS  . Warfarin - Pharmacist Dosing Inpatient   Does not apply q1800   Continuous Infusions: . azithromycin    . cefTRIAXone (ROCEPHIN)  IV 1 g (12/28/18 0540)     LOS: 1 day   Time spent: 83mn  Lailah Marcelli C Mykayla Brinton, DO [facility]  If 7PM-7AM, please contact night-coverage [email or web address]  Password [facility]11/16/2020, 7:49 AM

## 2018-12-28 NOTE — Progress Notes (Addendum)
[place] for Warfarin Indication: atrial fibrillation  Allergies  Allergen Reactions  . Codeine     NAUSEA  . Cymbalta [Duloxetine Hcl]     Nausea VOMITING AND ABDOMINAL PAIN, HEADACHE, JUST ABOUT EVERY SIDE EFFECT THE DRUG HAS  . Diltiazem Cd [Diltiazem Hcl Er Beads]     Heavy legs, cough  . Duloxetine     Other reaction(s): Other (See Comments) Nausea VOMITING AND ABDOMINAL PAIN, HEADACHE, JUST ABOUT EVERY SIDE EFFECT THE DRUG HAS  . Metoprolol     Legs like cement  . Nortriptyline     Nightmares  . Penicillins     RASH  & ITCHING   --PT STATES SHE CAN TAKE KEFLEX PO AND IV CEPHALOSPORINS  . Statins     Leg cramps  . Tetanus Toxoid Adsorbed Swelling  . Tetanus Toxoids     SWELLING, REDNESS  WHOLE ARM  . Ciprofloxacin Rash    RASH  . Lyrica [Pregabalin] Diarrhea, Nausea And Vomiting and Rash  . Penicillin G Rash    Patient Measurements: Height: 5' 2"  (157.5 cm) Weight: 200 lb 13.4 oz (91.1 kg) IBW/kg (Calculated) : 50.1 wt = 92 kg  Vital Signs: Temp: 98.8 F (37.1 C) (11/16 0521) Temp Source: Oral (11/16 0521) BP: 141/61 (11/16 0521) Pulse Rate: 75 (11/16 0521)  Labs: Recent Labs    12/27/18 0340 12/27/18 0540 12/28/18 0310  HGB 12.7  --  10.6*  HCT 42.4  --  34.4*  PLT 129*  --  118*  LABPROT  --  26.8* 27.8*  INR  --  2.5* 2.6*  CREATININE 0.74  --  0.79  TROPONINIHS 8 9  --     Estimated Creatinine Clearance: 56.9 mL/min (by C-G formula based on SCr of 0.79 mg/dL).   Assessment: 31 yoF with Crohn's dz, HTN, HLD, carotid stenosis, PAF and Hx PE on warfarin, dCHF, admitted for CAP, AECHF, and AFib w/ RVR; COVID PUI. Pharmacy to dose warfarin while admitted.   Baseline INR therapeutic  Prior anticoagulation: warfarin 2.5 mg daily except 3.75 mg MWF; LD 11/14   Today, 12/28/2018:  CBC: Hgb 12.7> 10.6, Plt 129>118  INR therapeutic  Major drug interactions: broad-spectrum abx  No bleeding issues per  nursing  Goal of Therapy: INR 2-3  Plan:  Warfarin 2.5 mg PO tonight at 18:00; consider maintaining 2.5 mg daily while acutely ill unless INR trend suggests otherwise  Daily INR  Monitor for signs of bleeding or thrombosis  IV azithromycin>PO per protocol  Eudelia Bunch, Pharm.D [phone] 12/28/2018 9:47 AM

## 2018-12-28 NOTE — Plan of Care (Signed)
  Problem: Education: Goal: Ability to demonstrate management of disease process will improve Outcome: Progressing Goal: Ability to verbalize understanding of medication therapies will improve Outcome: Progressing   

## 2018-12-29 LAB — BASIC METABOLIC PANEL
Anion gap: 10 (ref 5–15)
BUN: 24 mg/dL — ABNORMAL HIGH (ref 8–23)
CO2: 26 mmol/L (ref 22–32)
Calcium: 8.3 mg/dL — ABNORMAL LOW (ref 8.9–10.3)
Chloride: 106 mmol/L (ref 98–111)
Creatinine, Ser: 0.79 mg/dL (ref 0.44–1.00)
GFR calc Af Amer: >60 mL/min (ref >60)
GFR calc non Af Amer: >60 mL/min (ref >60)
Glucose, Bld: 122 mg/dL — ABNORMAL HIGH (ref 70–99)
Potassium: 3.7 mmol/L (ref 3.5–5.1)
Sodium: 142 mmol/L (ref 135–145)

## 2018-12-29 LAB — CBC
HCT: 33.7 % — ABNORMAL LOW (ref 36.0–46.0)
Hemoglobin: 10.1 g/dL — ABNORMAL LOW (ref 12.0–15.0)
MCH: 28 pg (ref 26.0–34.0)
MCHC: 30 g/dL (ref 30.0–36.0)
MCV: 93.4 fL (ref 80.0–100.0)
Platelets: 124 10*3/uL — ABNORMAL LOW (ref 150–400)
RBC: 3.61 MIL/uL — ABNORMAL LOW (ref 3.87–5.11)
RDW: 16.5 % — ABNORMAL HIGH (ref 11.5–15.5)
WBC: 5.4 10*3/uL (ref 4.0–10.5)
nRBC: 0 % (ref 0.0–0.2)

## 2018-12-29 LAB — LEGIONELLA PNEUMOPHILA SEROGP 1 UR AG: L. pneumophila Serogp 1 Ur Ag: NEGATIVE

## 2018-12-29 LAB — PROTIME-INR
INR: 2.4 — ABNORMAL HIGH (ref 0.8–1.2)
Prothrombin Time: 26 seconds — ABNORMAL HIGH (ref 11.4–15.2)

## 2018-12-29 MED ORDER — WARFARIN SODIUM 2.5 MG PO TABS
2.5000 mg | ORAL_TABLET | Freq: Once | ORAL | Status: AC
  Administered 2018-12-29: 2.5 mg via ORAL
  Filled 2018-12-29: qty 1

## 2018-12-29 MED ORDER — FUROSEMIDE 10 MG/ML IJ SOLN
40.0000 mg | Freq: Every day | INTRAMUSCULAR | Status: DC
  Administered 2018-12-29 – 2018-12-30 (×2): 40 mg via INTRAVENOUS
  Filled 2018-12-29 (×2): qty 4

## 2018-12-29 NOTE — Plan of Care (Signed)
  Problem: Education: Goal: Ability to demonstrate management of disease process will improve Outcome: Progressing Goal: Ability to verbalize understanding of medication therapies will improve Outcome: Progressing   Problem: Activity: Goal: Capacity to carry out activities will improve Outcome: Progressing   

## 2018-12-29 NOTE — Progress Notes (Signed)
[place] for Warfarin Indication: atrial fibrillation  Allergies  Allergen Reactions  . Codeine     NAUSEA  . Cymbalta [Duloxetine Hcl]     Nausea VOMITING AND ABDOMINAL PAIN, HEADACHE, JUST ABOUT EVERY SIDE EFFECT THE DRUG HAS  . Diltiazem Cd [Diltiazem Hcl Er Beads]     Heavy legs, cough  . Duloxetine     Other reaction(s): Other (See Comments) Nausea VOMITING AND ABDOMINAL PAIN, HEADACHE, JUST ABOUT EVERY SIDE EFFECT THE DRUG HAS  . Metoprolol     Legs like cement  . Nortriptyline     Nightmares  . Penicillins     RASH  & ITCHING   --PT STATES SHE CAN TAKE KEFLEX PO AND IV CEPHALOSPORINS  . Statins     Leg cramps  . Tetanus Toxoid Adsorbed Swelling  . Tetanus Toxoids     SWELLING, REDNESS  WHOLE ARM  . Ciprofloxacin Rash    RASH  . Lyrica [Pregabalin] Diarrhea, Nausea And Vomiting and Rash  . Penicillin G Rash    Patient Measurements: Height: 5' 2"  (157.5 cm) Weight: 203 lb 11.3 oz (92.4 kg) IBW/kg (Calculated) : 50.1 wt = 92 kg  Vital Signs: Temp: 98.9 F (37.2 C) (11/17 0506) Temp Source: Oral (11/17 0506) BP: 143/74 (11/17 0506) Pulse Rate: 84 (11/17 0506)  Labs: Recent Labs    12/27/18 0340 12/27/18 0540 12/28/18 0310 12/29/18 0536  HGB 12.7  --  10.6* 10.1*  HCT 42.4  --  34.4* 33.7*  PLT 129*  --  118* 124*  LABPROT  --  26.8* 27.8* 26.0*  INR  --  2.5* 2.6* 2.4*  CREATININE 0.74  --  0.79 0.79  TROPONINIHS 8 9  --   --     Estimated Creatinine Clearance: 57.3 mL/min (by C-G formula based on SCr of 0.79 mg/dL).   Assessment: 54 yoF with Crohn's dz, HTN, HLD, carotid stenosis, PAF and Hx PE on warfarin, dCHF, admitted for CAP, AECHF, and AFib w/ RVR; COVID PUI. Pharmacy to dose warfarin while admitted.   Baseline INR therapeutic  Prior anticoagulation: warfarin 2.5 mg daily except 3.75 mg MWF; LD 11/14   Today, 12/29/2018:  CBC: Hgb 10.1, Plt 129>118>124  INR remains therapeutic  Major drug  interactions: broad-spectrum abx  No bleeding issues per nursing  Goal of Therapy: INR 2-3  Plan:  Warfarin 2.5 mg PO tonight at 18:00  Daily INR  Monitor for signs of bleeding or thrombosis  Eudelia Bunch, Pharm.D [phone] 12/29/2018 11:00 AM

## 2018-12-29 NOTE — Progress Notes (Signed)
PROGRESS NOTE    [name]  EGB:[id number] DOB: 30-Jul-1934 DOA: 12/27/2018 PCP: Deland Pretty, MD   Brief Narrative:  [name]  is a 84 y.o. female, w Crohn's disease, Gerd, PUD, hypertension, hyperlipidemia, Carotid stenosis, Pafib, CHF (diastolic), OSA on Cpap, h/o PE, presents with c/o fever 100.5, and cough w yellow sputum and sob starting yesterday. Pt thinks gained 1.5 lbs.  Slight swelling of her ankles,  Left > right.  Pt denies cp, palp, orthopnea, pnd.  N/v, abd pain, diarrhea, brbpr.  Pt denies any covid-19 exposure  Assessment & Plan:   Principal Problem:   Acute on chronic diastolic congestive heart failure (HCC) Active Problems:   Atrial fibrillation with RVR (HCC)   Chronic atrial fibrillation (HCC)   CAP (community acquired pneumonia)   Acute hypoxic respiratory failure in the setting of acute on Chronic Diastolic CHF (EF 37%), POA Symptomatically improving daily -mild desaturation at night? undiagnosed OSA Continues to require 4 L nasal cannula even at rest-ambulatory O2 screening in the next 24 hours Baseline is 2 L nasal cannula around-the-clock Continue Bystolic 10GY po qday Continue Kcl 39mq po qday Increase Lasix 476miv qday -patient positive ins and outs yesterday Decrease fluid intake restrictions to 1500 cc  Afib with RVR, resolved; likely provoked given ?Cap/HF Trop within normal limits Recent echo June 2011, no need for repeat at this time Cont Bystolic Coumadin pharmacy to dose -INR 2.5  CAP, POA Likely cause of Afib w RVR/ Acute CHF Chest x-ray personally reviewed, questionable right lower lobe infiltrate  Blood culture x2 preliminary negative Urine legionella antigen Urine strep antigen negative Covid -19 negative Rocephin 1gm iv qday , Zithromax 50080mv qday x5d -tentative stop 01/01/19  Hypokalemia, mild  Potassium 3.7 follow with morning labs  Hypertension Cont Bystolic Cont Amlodipine 5mg67m qday  Gout Cont  Allopurinol 300mg17mqday  Glaucoma Cont Alphagan Cont Cosopt Patient also on ?Rhopressa -not in stock, husband to bring bottle in  Allerghic Rhinitis Cont Zyrtec-> Claritin Cont Atrovent nasal spray Cont Nasacort   Gerd/ PUD Cont Tagamet  OSA Cpap  DVT Prophylaxis-   Coumadin - SCDs   Code Status:  FULL CODE per patient  Subjective: No acute issues or events overnight, clinically improving, admits to ongoing shortness of breath weakness fatigue and malaise but moderately improved since admission.  Otherwise denies overt nausea, vomiting, diarrhea, constipation, headache, fevers, chills.  Objective: Vitals:   12/28/18 2005 12/28/18 2057 12/29/18 0500 12/29/18 0506  BP:  (!) 146/81  (!) 143/74  Pulse: 84 93  84  Resp: 18   18  Temp:  98.7 F (37.1 C)  98.9 F (37.2 C)  TempSrc:  Oral  Oral  SpO2: 95% 97%  (!) 83%  Weight:   92.4 kg   Height:        Intake/Output Summary (Last 24 hours) at 12/29/2018 0745 Last data filed at 12/29/2018 0600 Gross per 24 hour  Intake 1070 ml  Output 800 ml  Net 270 ml   Filed Weights   12/27/18 0800 12/28/18 0521 12/29/18 0500  Weight: 92 kg 91.1 kg 92.4 kg    Examination: General:  Pleasantly resting in bed, No acute distress. HEENT:  Normocephalic atraumatic.  Sclerae nonicteric, noninjected.  Extraocular movements intact bilaterally. Neck:  Without mass or deformity.  Trachea is midline. Lungs: Bibasilar rales without overt rhonchi or wheeze, markedly diminished breath sounds bilaterally. Heart: Irregularly irregular rate and rhythm.  Without murmurs, rubs, or gallops. Abdomen:  Soft, nontender, nondistended.  Without guarding or rebound. Extremities: Without cyanosis, clubbing, 1+ edema laterally, or obvious deformity. Vascular:  Dorsalis pedis and posterior tibial pulses palpable bilaterally. Skin:  Warm and dry, no erythema, no ulcerations.   Data Reviewed: I have personally reviewed following labs and imaging  studies  CBC: Recent Labs  Lab 12/27/18 0340 12/28/18 0310 12/29/18 0536  WBC 10.7* 6.3 5.4  NEUTROABS 9.0*  --   --   HGB 12.7 10.6* 10.1*  HCT 42.4 34.4* 33.7*  MCV 93.4 93.0 93.4  PLT 129* 118* 301*   Basic Metabolic Panel: Recent Labs  Lab 12/27/18 0340 12/28/18 0310 12/29/18 0536  NA 142 145 142  K 4.0 3.0* 3.7  CL 106 104 106  CO2 24 32 26  GLUCOSE 160* 123* 122*  BUN 12 19 24*  CREATININE 0.74 0.79 0.79  CALCIUM 8.5* 8.1* 8.3*   GFR: Estimated Creatinine Clearance: 57.3 mL/min (by C-G formula based on SCr of 0.79 mg/dL).  Coagulation Profile: Recent Labs  Lab 12/27/18 0540 12/28/18 0310 12/29/18 0536  INR 2.5* 2.6* 2.4*   Sepsis Labs: Recent Labs  Lab 12/27/18 0342  LATICACIDVEN 1.5    Radiology Studies: No results found. Scheduled Meds: . allopurinol  300 mg Oral Daily  . amLODipine  5 mg Oral Daily  . azithromycin  500 mg Oral Daily  . brimonidine  1 drop Both Eyes BID  . dorzolamide-timolol  1 drop Both Eyes BID  . famotidine  10 mg Oral BID  . furosemide  40 mg Intravenous Daily  . latanoprost  1 drop Both Eyes QHS  . loratadine  10 mg Oral Daily  . mouth rinse  15 mL Mouth Rinse BID  . nebivolol  10 mg Oral QHS  . potassium chloride  20 mEq Oral Daily  . traZODone  25 mg Oral QHS  . Warfarin - Pharmacist Dosing Inpatient   Does not apply q1800   Continuous Infusions: . cefTRIAXone (ROCEPHIN)  IV 1 g (12/29/18 0527)     LOS: 2 days   Time spent: 84mn  Palin Tristan C Oather Muilenburg, DO [facility]  If 7PM-7AM, please contact night-coverage [email or web address] Password [email or web address] 12/29/2018, 7:45 AM

## 2018-12-30 ENCOUNTER — Telehealth: Payer: Self-pay | Admitting: Cardiovascular Disease

## 2018-12-30 DIAGNOSIS — I509 Heart failure, unspecified: Secondary | ICD-10-CM

## 2018-12-30 LAB — BASIC METABOLIC PANEL
Anion gap: 11 (ref 5–15)
BUN: 27 mg/dL — ABNORMAL HIGH (ref 8–23)
CO2: 26 mmol/L (ref 22–32)
Calcium: 8.4 mg/dL — ABNORMAL LOW (ref 8.9–10.3)
Chloride: 104 mmol/L (ref 98–111)
Creatinine, Ser: 1.22 mg/dL — ABNORMAL HIGH (ref 0.44–1.00)
GFR calc Af Amer: 48 mL/min — ABNORMAL LOW (ref >60)
GFR calc non Af Amer: 41 mL/min — ABNORMAL LOW (ref >60)
Glucose, Bld: 131 mg/dL — ABNORMAL HIGH (ref 70–99)
Potassium: 3.9 mmol/L (ref 3.5–5.1)
Sodium: 141 mmol/L (ref 135–145)

## 2018-12-30 LAB — CBC
HCT: 37 % (ref 36.0–46.0)
Hemoglobin: 11.1 g/dL — ABNORMAL LOW (ref 12.0–15.0)
MCH: 28.3 pg (ref 26.0–34.0)
MCHC: 30 g/dL (ref 30.0–36.0)
MCV: 94.4 fL (ref 80.0–100.0)
Platelets: 151 10*3/uL (ref 150–400)
RBC: 3.92 MIL/uL (ref 3.87–5.11)
RDW: 16.4 % — ABNORMAL HIGH (ref 11.5–15.5)
WBC: 5.7 10*3/uL (ref 4.0–10.5)
nRBC: 0 % (ref 0.0–0.2)

## 2018-12-30 LAB — PROTIME-INR
INR: 2.3 — ABNORMAL HIGH (ref 0.8–1.2)
Prothrombin Time: 25.3 seconds — ABNORMAL HIGH (ref 11.4–15.2)

## 2018-12-30 MED ORDER — WARFARIN SODIUM 3 MG PO TABS
3.5000 mg | ORAL_TABLET | ORAL | Status: DC
  Administered 2018-12-30: 3.5 mg via ORAL
  Filled 2018-12-30: qty 1

## 2018-12-30 MED ORDER — SODIUM CHLORIDE 0.9 % IV BOLUS: 500.0000 mL | Freq: Once | INTRAVENOUS | Status: DC

## 2018-12-30 MED ORDER — WARFARIN SODIUM 2.5 MG PO TABS: 2.5000 mg | ORAL_TABLET | ORAL | Status: DC

## 2018-12-30 MED ORDER — WARFARIN SODIUM 2.5 MG PO TABS
3.7500 mg | ORAL_TABLET | ORAL | Status: DC
  Filled 2018-12-30: qty 1

## 2018-12-30 MED ORDER — FUROSEMIDE 20 MG PO TABS
20.0000 mg | ORAL_TABLET | Freq: Every day | ORAL | Status: DC
  Administered 2018-12-31: 20 mg via ORAL
  Filled 2018-12-30: qty 1

## 2018-12-30 NOTE — Telephone Encounter (Signed)
12/27/2018 - present (3 days) [facility]

## 2018-12-30 NOTE — Consult Note (Addendum)
Cardiology Consultation:   Patient ID: [name]; [id number]; [birth year]   Admit date: 12/27/2018 Date of Consult: 12/30/2018  Primary Care Provider: Deland Pretty, MD Primary Cardiologist: Bonnie Martinique, MD Primary Electrophysiologist:  None    Patient Profile:   [name] is a 84 y.o. female with a PMH of non-obstructive CAD, chronic diastolic CHF, HTN, HLD, carotid artery disease followed by Dr. Donnetta Stanley, OSA on CPAP, crohns disease, and GERD who is being seen today for the evaluation of acute on chronic diastolic CHF at the request of Dr. Tana Stanley.  History of Present Illness:   [name] presented to the hospital 12/27/2018 with complaints of fever, cough, and SOB. She was found to have RLL PNA on CXR. Additionally she was found to be in atrial fibrillation with RVR and she was felt to be volume overloaded, both likely driven by underlying infection. She had increased O2 needs. BNP elevated to 300. HsT negative x2. HR's improved with continued home dosing of bystolic and management of PNA. She was started on IV lasix, though I&Os are incomplete with numerous unmeasured urine occurrences. Weight have been labile but overall stagnant at 202lbs this admission. Cr is now up to 1.22 today from baseline 0.79. She has been hypertensive despite continued home amlodipine and bystolic. Cardiology asked to evaluate for acute on chronic diastolic CHF.    She was last evaluated by cardiology via a telemedicine visit with Bonnie Sims, NP 12/16/2018, at which time she had multiple somatic complaints including fatigue, feeling "weepy" in the mornings, chronic DOE with ambulatory O2 sats in the 80s if not wearing her oxygen, and chronic joint pains. She reported generally stable weights with some weight loss. No medication changes occurred at that visit and she was recommended for close follow-up by PCP and follow-up with Dr. Martinique in 6 months. Her last echo 07/2018 showed EF 55-60%, severe LAE,  moderate RAE, and no significant valvular abnormalities. Per Dr. Martinique, she does not have any significant coronary disease - she had cardiac catheterization in 1994 showing only mild plaque and she had a nuclear stress test in December 2011 showing no ischemia and her ejection fraction was 78%  At the time of this evaluation she reports breathing is back to baseline but is still feeling fatigued. Prior to admission she reported increased DOE, weight gain, and LE edema. No complaints of chest pain, palpitations, dizziness, lightheadedness, or syncope. She reports taking lasix 15m daily with occasional 268mdaily if increased weight or LE edema.   Past Medical History:  Diagnosis Date   Atrial fibrillation [place]   Carotid artery occlusion    Chronic diastolic CHF (congestive heart failure) (HCC)    Hypertensive heart disease 02-12-14   Crohn's disease ([place]   Detached retina    Fibromyalgia    GERD (gastroesophageal reflux disease)    Gout    H/O hiatal hernia    High triglycerides    History of stomach ulcers 1950's   Hypertension    Long term current use of anticoagulant    Obstructive sleep apnea on CPAP    Osteoarthritis    PAIN AND OA LEF T HIP AND BOTH SHOULDERS ARE BONE ON BONE AND PAINFUL   Peripheral vascular disease (HCC)    KNOWN RIGHT INTERNAL CAROTID ARTERY OCCLUSION (NO STROKE)  --40 TO 59% STENOSIS LEFT ICA-FOLLOWED BY DR. EARLY WITH DOPPLER STUDY EVERY 6 MONTHS   [place]   a. Hx of PE in 02/2009  after R hip surgery, venous dopplers negative, long-term Coumadin.   PVC (premature ventricular contraction)    PT STATES HX OF PVC'S ON EKG   Recurrent upper respiratory infection (URI)    BRONCHITIS FEB 2013--SLIGHT COUGH NON-PRODUCTIVE NOW   Recurrent UTI (urinary tract infection)    "on daily medicine" (05/14/2012)   Tinnitus    Vertigo     Past Surgical History:  Procedure Laterality Date   APPENDECTOMY  1950's   BACK  SURGERY  10/29/06   Central and foraminal decompression L3-L4, L4-L5, and L5-S1 with inspection of L4-L5 and L5-S1 disc on the right   CARDIAC CATHETERIZATION  1970's   "maybe 2" (05/14/2012)   CARDIAC CATHETERIZATION     CATARACT EXTRACTION W/ INTRAOCULAR LENS IMPLANT Right 2009   CHOLECYSTECTOMY  ~ [place] LEVEL 3  ~ 2002   DILATION AND CURETTAGE OF UTERUS     "several; from miscarriages" (05/14/2012)   EXCISION MORTON'S NEUROMA Right 05/20/00   "foot" (05/14/2012)   EYE SURGERY Left 2014   Detached retina   EYE SURGERY Left August 23, 2013   Cataract   FEMUR FRACTURE SURGERY Right 02/21/09   Open reduction internal fixation of right periprosthetic  femur fracture utilizing Zimmer cables times fiv   FRACTURE SURGERY  2011   Right Femur Fx   HAMMER TOE SURGERY Left 10/25/08   "toe next to big to" (05/14/2012)   JOINT REPLACEMENT  2009   Right Hip replacement   JOINT REPLACEMENT  2012   Right knee replacement   JOINT REPLACEMENT  06/17/11   Left Hip replacement   KNEE ARTHROSCOPY Right 07/26/05   REPLACEMENT TOTAL KNEE Right 02/19/2010   [place]?   TOTAL HIP ARTHROPLASTY Right 12/08/02   Osteonics total hip replacement   TOTAL HIP ARTHROPLASTY  06/17/2011   Procedure: TOTAL HIP ARTHROPLASTY;  Surgeon: Gearlean Alf, MD;  Location: [facility];  Service: Orthopedics;  Laterality: Left;   TOTAL SHOULDER ARTHROPLASTY Left 05/14/2012   TOTAL SHOULDER ARTHROPLASTY Left 05/14/2012   Procedure: TOTAL SHOULDER ARTHROPLASTY;  Surgeon: Marin Shutter, MD;  Location: [facility];  Service: Orthopedics;  Laterality: Left;   VAGINAL HYSTERECTOMY  1971     Home Medications:  Prior to Admission medications   Medication Sig Start Date End Date Taking? Authorizing Provider  allopurinol (ZYLOPRIM) 300 MG tablet Take 300 mg by mouth daily. 06/09/16  Yes [provider]  amLODipine (NORVASC) 5 MG tablet Take 5 mg  by mouth daily. 04/24/18  Yes [provider]  brimonidine (ALPHAGAN) 0.2 % ophthalmic solution 1 drop 2 (two) times daily.  03/20/18  Yes [provider]  celecoxib (CELEBREX) 100 MG capsule Take 100 mg by mouth daily.  02/21/18  Yes [provider]  cetirizine (ZYRTEC) 10 MG tablet Take 10 mg by mouth daily.   Yes [provider]  CRANBERRY CONCENTRATE PO Take 1 tablet by mouth daily.   Yes [provider]  dorzolamide-timolol (COSOPT) 22.3-6.8 MG/ML ophthalmic solution Place 1 drop into both eyes 2 (two) times daily.  06/28/13  Yes [provider]  furosemide (LASIX) 20 MG tablet TAKE (1/2) TABLET DAILY. 12/10/18  Yes Stanley, Bonnie M, MD  HYDROcodone-acetaminophen (NORCO/VICODIN) 5-325 MG tablet Take 0.5-1 tablets by mouth 3 (three) times daily as needed for pain.  06/10/18  Yes [provider]  ipratropium (ATROVENT) 0.06 % nasal spray Place 1 spray into both nostrils  daily as needed (allergies).  04/13/18  Yes [provider]  LUMIGAN 0.01 % SOLN Place 1 drop into both eyes at bedtime. 09/04/16  Yes [provider]  mupirocin ointment (BACTROBAN) 2 % Apply 1 application topically 2 (two) times daily. 04/22/17  Yes Trula Slade, DPM  nebivolol (BYSTOLIC) 10 MG tablet Take 1 tablet (10 mg total) by mouth at bedtime. 05/19/18  Yes Stanley, Bonnie M, MD  traZODone (DESYREL) 50 MG tablet Take 25 mg by mouth at bedtime.  08/09/16  Yes [provider]  warfarin (COUMADIN) 2.5 MG tablet TAKE 1 TO 1&1/2 TABLETS DAILY AS DIRECTED BY COUMADIN CLINIC. Patient taking differently: Take 2.5-3.75 mg by mouth as directed. TAKE 1.5 tablets (3.75 mg) on (MWF) & Take 1 tablet (2.5 mg) all other days. 06/09/18  Yes Stanley, Bonnie M, MD  cimetidine (TAGAMET) 200 MG tablet Take 200 mg by mouth 2 (two) times daily.    [provider]  FLUZONE HIGH-DOSE QUADRIVALENT 0.7 ML SUSY ADM 0.7ML IM UTD 10/15/18   [provider]    potassium chloride (K-DUR) 10 MEQ tablet Take 2 tablets (20 mEq total) by mouth daily for 30 days. MWF 07/16/18 08/15/18  Arrien, Jimmy Picket, MD  potassium chloride (KLOR-CON) 10 MEQ tablet  10/08/18   [provider]  silver sulfADIAZINE (SILVADENE) 1 % cream Apply 1 application topically daily. 12/12/15   Tuchman, Richard Loletha Grayer, DPM  triamcinolone (NASACORT ALLERGY 24HR) 55 MCG/ACT AERO nasal inhaler Place 2 sprays into the nose daily as needed (allergies).     [provider]    Inpatient Medications: Scheduled Meds:  allopurinol  300 mg Oral Daily   amLODipine  5 mg Oral Daily   azithromycin  500 mg Oral Daily   brimonidine  1 drop Both Eyes BID   dorzolamide-timolol  1 drop Both Eyes BID   famotidine  10 mg Oral BID   furosemide  40 mg Intravenous Daily   latanoprost  1 drop Both Eyes QHS   loratadine  10 mg Oral Daily   mouth rinse  15 mL Mouth Rinse BID   nebivolol  10 mg Oral QHS   potassium chloride  20 mEq Oral Daily   traZODone  25 mg Oral QHS   Warfarin - Pharmacist Dosing Inpatient   Does not apply q1800   Continuous Infusions:  cefTRIAXone (ROCEPHIN)  IV 1 g (12/30/18 0544)   PRN Meds: acetaminophen **OR** acetaminophen, hydrALAZINE, HYDROcodone-acetaminophen, ipratropium  Allergies:    Allergies  Allergen Reactions   Codeine     NAUSEA   Cymbalta [Duloxetine Hcl]     Nausea VOMITING AND ABDOMINAL PAIN, HEADACHE, JUST ABOUT EVERY SIDE EFFECT THE DRUG HAS   Diltiazem Cd [Diltiazem Hcl Er Beads]     Heavy legs, cough   Duloxetine     Other reaction(s): Other (See Comments) Nausea VOMITING AND ABDOMINAL PAIN, HEADACHE, JUST ABOUT EVERY SIDE EFFECT THE DRUG HAS   Metoprolol     Legs like cement   Nortriptyline     Nightmares   Penicillins     RASH  & ITCHING   --PT STATES SHE CAN TAKE KEFLEX PO AND IV CEPHALOSPORINS   Statins     Leg cramps   Tetanus Toxoid Adsorbed Swelling   Tetanus Toxoids     SWELLING, REDNESS   WHOLE ARM   Ciprofloxacin Rash    RASH   Lyrica [Pregabalin] Diarrhea, Nausea And Vomiting and Rash   Penicillin G Rash  Social History:   Social History   Socioeconomic History   Marital status: Married    Spouse name: Not on file   Number of children: Not on file   Years of education: Not on file   Highest education level: Not on file  Occupational History   Not on file  Social Needs   Financial resource strain: Not on file   Food insecurity    Worry: Not on file    Inability: Not on file   Transportation needs    Medical: Not on file    Non-medical: Not on file  Tobacco Use   Smoking status: Former Smoker    Packs/day: 0.50    Years: 15.00    Pack years: 7.50    Types: Cigarettes    Quit date: 02/11/1986    Years since quitting: 32.9   Smokeless tobacco: Never Used  Substance and Sexual Activity   Alcohol use: Yes    Alcohol/week: 0.0 standard drinks    Comment: 05/14/2012 "have 2-3 drinks/yr" maybe   Drug use: No   Sexual activity: Never  Lifestyle   Physical activity    Days per week: Not on file    Minutes per session: Not on file   Stress: Not on file  Relationships   Social connections    Talks on phone: Not on file    Gets together: Not on file    Attends religious service: Not on file    Active member of club or organization: Not on file    Attends meetings of clubs or organizations: Not on file    Relationship status: Not on file   Intimate partner violence    Fear of current or ex partner: Not on file    Emotionally abused: Not on file    Physically abused: Not on file    Forced sexual activity: Not on file  Other Topics Concern   Not on file  Social History Narrative   [facility] arranged CPAP.  Lives in a one level house with two steps two enter house with her husband.  Uses a cane.  Has had OT and an RN for her shoulder service.  [employer].      Family History:    Family History  Problem Relation Age of Onset     Heart disease Father        Heart Disease before age 25   Heart attack Father 88   Hypertension Father    Peripheral vascular disease Father        Right  leg amputation   Heart disease Mother        AAA   Stroke Mother    Aneurysm Mother    Hypertension Mother    AAA (abdominal aortic aneurysm) Mother    Diabetes Paternal Grandfather    Hypertension Brother    Heart disease Brother        Heart Disease before age 67   Hyperlipidemia Brother    Heart attack Brother    Hypertension Brother    Diabetes Son      ROS:  Please see the history of present illness.   All other ROS reviewed and negative.     Physical Exam/Data:   Vitals:   12/29/18 2019 12/29/18 2115 12/30/18 0314 12/30/18 0538  BP:  (!) 153/80  (!) 163/88  Pulse: 95 96  76  Resp: 20 18  18   Temp:  98.7 F (37.1 C)  97.9 F (36.6 C)  TempSrc:  Oral    SpO2: 95% 95%  99%  Weight:   91.9 kg   Height:        Intake/Output Summary (Last 24 hours) at 12/30/2018 0953 Last data filed at 12/30/2018 0600 Gross per 24 hour  Intake 1090 ml  Output 1500 ml  Net -410 ml   Filed Weights   12/29/18 0500 12/29/18 1000 12/30/18 0314  Weight: 92.4 kg 92.7 kg 91.9 kg   Body mass index is 37.07 kg/m.  General:  Obese elderly female sitting in bedside chair in no acute distress HEENT: sclera anicteric  Neck: no JVD Vascular:  distal pulses 2+ bilaterally Cardiac:  normal S1, S2; IRIR; no murmurs, rubs, or gallops Lungs:  clear to auscultation bilaterally, no wheezing, rhonchi or rales; normal work of breathing, speaking in full sentences, currently satting well off O2 via [place]  Abd: NABS, soft, obese, nontender, no hepatomegaly Ext: 1+ LE edema L>R Musculoskeletal:  No deformities, BUE and BLE strength normal and equal Skin: warm and dry  Neuro:  CNs 2-12 intact, no focal abnormalities noted Psych:  Normal affect   EKG:  The EKG was personally reviewed and demonstrates:  Atrial fibrillation  with RVR, rate 110, chronic TWI in inferolateral leads, no STE/D; no significant change from previous.  Telemetry:  Telemetry was personally reviewed and demonstrates:  Atrial fibrillation with occasional PVCs and brief run of NSVT  Relevant CV Studies: Echocardiogram 07/2018: 1. The left ventricle has normal systolic function, with an ejection fraction of 55-60%. The cavity size was normal. Left ventricular diastolic function could not be evaluated secondary to atrial fibrillation.  2. The right ventricle has normal systolic function. The cavity was normal. There is no increase in right ventricular wall thickness. Right ventricular systolic pressure is normal with an estimated pressure of 27.9 mmHg.  3. Left atrial size was severely dilated.  4. Right atrial size was moderately dilated.  5. Trivial pericardial effusion is present.  6. The aortic root and ascending aorta are normal in size and structure.  7. The inferior vena cava was dilated in size with >50% respiratory variability.  Laboratory Data:  Chemistry Recent Labs  Lab 12/28/18 0310 12/29/18 0536 12/30/18 0342  NA 145 142 141  K 3.0* 3.7 3.9  CL 104 106 104  CO2 32 26 26  GLUCOSE 123* 122* 131*  BUN 19 24* 27*  CREATININE 0.79 0.79 1.22*  CALCIUM 8.1* 8.3* 8.4*  GFRNONAA >60 >60 41*  GFRAA >60 >60 48*  ANIONGAP 9 10 11     Recent Labs  Lab 12/28/18 0310  PROT 6.1*  ALBUMIN 2.8*  AST 12*  ALT 12  ALKPHOS 71  BILITOT 1.2   Hematology Recent Labs  Lab 12/28/18 0310 12/29/18 0536 12/30/18 0342  WBC 6.3 5.4 5.7  RBC 3.70* 3.61* 3.92  HGB 10.6* 10.1* 11.1*  HCT 34.4* 33.7* 37.0  MCV 93.0 93.4 94.4  MCH 28.6 28.0 28.3  MCHC 30.8 30.0 30.0  RDW 16.6* 16.5* 16.4*  PLT 118* 124* 151   Cardiac EnzymesNo results for input(s): TROPONINI in the last 168 hours. No results for input(s): TROPIPOC in the last 168 hours.  BNP Recent Labs  Lab 12/27/18 0340  BNP 300.3*    DDimer No results for input(s): DDIMER  in the last 168 hours.  Radiology/Studies:  Dg Chest Port 1 View  Result Date: 12/27/2018 CLINICAL DATA:  Shortness of breath EXAM: PORTABLE CHEST 1 VIEW COMPARISON:  09/08/2018 FINDINGS: Cardiomegaly with vascular congestion and diffuse  interstitial opacities compatible with edema. Small bilateral effusions. Left base atelectasis or infiltrate. IMPRESSION: Cardiomegaly, mild pulmonary edema. Small bilateral effusions. Left lower lobe atelectasis or infiltrate. Electronically Signed   By: Rolm Baptise M.D.   On: 12/27/2018 03:52    Assessment and Plan:   1. Acute on chronic respiratory failure: patient presented with SOB, cough, and fever. Found to have LLL PNA on CXR, started on IV antibiotics. Also with acute on chronic diastolic CHF with BNP 062 and mild pulmonary edema on CXR. She was started on IV lasix with incomplete I&Os documented. Weight has been up/down, currently 202lbs (same as admit weight). Still with increased O2 demands. Unfortunately Cr has bumped to 1.2 from baseline 0.79. Likely OHS playing a role as well.  - Continue antibiotics per primary team - Transition to lasix 37m po daily tomorrow x7 days with potassium 20 mEq daily, then lasix 112mdaily dosing going forward - Continue bystolic  2. Permanent atrial fibrillation: presented with Afib RVR, likely driven by #1. Rates improved with continued home bystolic. INR's have been therapeutic on coumadin - 2.3 today. - Continue bystolic for rate control - Continue coumadin for stroke ppx - coumadin clinic appointment reschedule for 01/01/2019 at 11:15am  3. HTN: BP persistently elevated despite continuing home amlodipine and bystolic.  - Can readdress outpatient.   4. HLD: no lipids on file; noted to be intolerant to statins  5. OSA: compliant with CPAP - Continue CPAP  6. AKI: Cr 1.22 today, up from baseline 0.79. Likely 2/2 diuresis - Continue to monitor - Check BMET at follow-up.  [facility] will sign off.    Medication Recommendations:  As above Other recommendations (labs, testing, etc):  BMET at follow-up Follow up as an outpatient:  Appointment scheduled for 12/1 at 8am with KaJory SimsNP  For questions or updates, please contact [facility] Please consult [email or web address] for contact info under Cardiology/STEMI.   Signed, KrAbigail ButtsPA-C  12/30/2018 9:53 AM [phone] Note:   The patient was seen and examined.  Agree with assessment and plan as noted above.  Changes made to the above note as needed.  Patient seen and independently examined with  KrRoby LoftsPA .   We discussed all aspects of the encounter. I agree with the assessment and plan as stated above.  1.   Chronic diastolic CHF:  She is well diuresed - in fact, may be a bit volume depleted ( creatinine is slightly elevated )  Will change the lasix to PO She should take lasix 20 a day for at least a week and then she can reduce the dose back to her previous dose of 10 mg a day with an extra 10 mg as needed for weight gain Continue  Kdur 10 bid. We will have her seen in a week or so Coumadin clinic on Friday of this week   2.  Atrial fib:   Permanent AF.   On coumadin .   Coumadin clinic this Friday   She is stable and should be able to be discharged tomorrow.      I have spent a total of 40 minutes with patient reviewing hospital  notes , telemetry, EKGs, labs and examining patient as well as establishing an assessment and plan that was discussed with the patient. > 50% of time was spent in direct patient care.    PhThayer HeadingsJrBrooke Bonito MD, [facility]1/18/2020, 2:06 PM [address] Office -  [phone] Pager [phone]

## 2018-12-30 NOTE — Telephone Encounter (Signed)
Patient is scheduled for [facility] appointment with Jory Sims 01/12/20

## 2018-12-30 NOTE — Progress Notes (Signed)
Pt c/o intermittent leg cramping throughout the night. Pt had a restless night due to leg discomfort. Pt currently resting in recliner. Will continue to monitor during the shift and update MD. SRP, RN

## 2018-12-30 NOTE — Progress Notes (Signed)
[facility]                                                                              Patient Demographics  [name], is a 84 y.o. female, DOB - [birth year], MOL:[id number]  Admit date - 12/27/2018   Admitting Physician Jani Gravel, MD  Outpatient Primary MD for the patient is Deland Pretty, MD  Outpatient specialists:   LOS - 3  days   Medical records reviewed and are as summarized below:    chief complaint : Fever, coughing, shortness of breath      Brief summary    [name] a84 y.o.female,w Crohn's disease, Gerd, PUD, hypertension, hyperlipidemia, Carotid stenosis, Pafib, CHF (diastolic), OSA on Cpap, h/o PE, presents with c/ofever 100.5, and cough w yellow sputum and sob starting yesterday. Pt thinks gained 1.5 lbs. Slight swelling of her ankles, Left >right. Pt denies cp, palp, orthopnea, pnd. N/v, abd pain, diarrhea, brbpr. Pt denies any covid-19 exposure  Assessment & Plan    Principal Problem: Acute respiratory failure with hypoxia in the setting of acute on chronic diastolic CHF, POA -Slowly improving, O2 sats 99% on 2 L -Currently on IV Lasix for diuresis, negative balance of 1.89 L.  Weight with not much change. -2D echo 07/2018 had shown EF of 55 to 60% -Cardiology consulted, outpatient cardiologist Dr. Martinique.  Discussed with Dr. Acie Fredrickson, patient appears to be close to her baseline, well diuresed, change Lasix to p.o. -Recommended Lasix 20 mg daily for at least a week then reduce to 10 mg daily with extra 10 mg as needed for weight gain, K-Dur 10 mg twice daily  Permanent atrial fibrillation with RVR -Heart rate now controlled, likely worsened due to CHF, Pneumonia -On Coumadin   Pneumonia, CAP, POA -COVID-19 test negative.  Chest x-ray showed questionable right lower lobe infiltrate.  Blood cultures negative so far.  Urine strep antigen negative. -Continue Zithromax, Rocephin -Home O2 evaluation prior to discharge.   Hypokalemia Resolved  Hypertension BP currently stable, continue amlodipine, Bystolic  Gout Continue allopurinol  GERD -Continue Tagamet  OSA -Continue CPAP   Code Status: Full CODE STATUS DVT Prophylaxis: Warfarin Family Communication: Discussed all imaging results, lab results, explained to the patient   Disposition Plan: Home O2 evaluation, plan for DC home in a.m.  Time Spent in minutes   35 minutes  Procedures:  None  Consultants:   Cardiology  Antimicrobials:   Anti-infectives (From admission, onward)   Start     Dose/Rate Route Frequency Ordered Stop   12/28/18 1000  azithromycin (ZITHROMAX) tablet 500 mg     500 mg Oral Daily 12/28/18 0944     12/28/18 0800  azithromycin (ZITHROMAX) 500 mg in sodium chloride 0.9 % 250 mL IVPB  Status:  Discontinued     500 mg 250 mL/hr over 60 Minutes Intravenous Every 24 hours 12/27/18 0552 12/28/18 0944   12/28/18 0600  cefTRIAXone (ROCEPHIN) 1 g in sodium chloride 0.9 % 100 mL IVPB     1 g 200 mL/hr over 30 Minutes Intravenous Every 24 hours 12/27/18 0552     12/27/18 0545  cefTRIAXone (ROCEPHIN) 1 g in  sodium chloride 0.9 % 100 mL IVPB     1 g 200 mL/hr over 30 Minutes Intravenous  Once 12/27/18 0536 12/27/18 0636   12/27/18 0545  azithromycin (ZITHROMAX) 500 mg in sodium chloride 0.9 % 250 mL IVPB     500 mg 250 mL/hr over 60 Minutes Intravenous  Once 12/27/18 0536 12/27/18 0747          Medications  Scheduled Meds: . allopurinol  300 mg Oral Daily  . amLODipine  5 mg Oral Daily  . azithromycin  500 mg Oral Daily  . brimonidine  1 drop Both Eyes BID  . dorzolamide-timolol  1 drop Both Eyes BID  . famotidine  10 mg Oral BID  . [START ON 12/31/2018] furosemide  20 mg Oral Daily  . latanoprost  1 drop Both Eyes QHS  . loratadine  10 mg Oral Daily  . mouth rinse  15 mL Mouth Rinse BID  . nebivolol  10 mg Oral QHS  . potassium chloride  20 mEq Oral Daily  . traZODone  25 mg Oral QHS  . warfarin  3.5 mg  Oral Once per day on Mon Wed Fri   And  . [START ON 12/31/2018] warfarin  2.5 mg Oral Once per day on Sun Tue Thu Sat  . Warfarin - Pharmacist Dosing Inpatient   Does not apply q1800   Continuous Infusions: . cefTRIAXone (ROCEPHIN)  IV 1 g (12/30/18 0544)   PRN Meds:.acetaminophen **OR** acetaminophen, hydrALAZINE, HYDROcodone-acetaminophen, ipratropium      Subjective:   [name] was seen and examined today.  Overall improving, still on 2 L O2 via nasal cannula.  Denies any chest pain.  Mild shortness of breath improving. Patient denies dizziness, chest pain,  abdominal pain, N/V/D/C, new weakness, numbess, tingling. No acute events overnight.    Objective:   Vitals:   12/29/18 2115 12/30/18 0314 12/30/18 0538 12/30/18 1235  BP: (!) 153/80  (!) 163/88 139/85  Pulse: 96  76 94  Resp: 18  18 18   Temp: 98.7 F (37.1 C)  97.9 F (36.6 C) 98.4 F (36.9 C)  TempSrc: Oral   Oral  SpO2: 95%  99% 91%  Weight:  91.9 kg    Height:        Intake/Output Summary (Last 24 hours) at 12/30/2018 1525 Last data filed at 12/30/2018 1241 Gross per 24 hour  Intake 850 ml  Output 2150 ml  Net -1300 ml     Wt Readings from Last 3 Encounters:  12/30/18 91.9 kg  12/16/18 92.1 kg  09/08/18 91.6 kg     Exam  General: Alert and oriented x 3, NAD, sitting in the chair  Eyes:   HEENT:  Atraumatic, normocephalic, normal oropharynx  Cardiovascular: S1 S2 auscultated, no murmurs, RRR  Respiratory: Decreased breath sound at the bases  Gastrointestinal: Soft, nontender, nondistended, + bowel sounds  Ext: 1+ pedal edema bilaterally  Neuro: Strength 5/5 upper and lower extremities bilaterally  Musculoskeletal: No digital cyanosis, clubbing  Skin: No rashes  Psych: Normal affect and demeanor, alert and oriented x3    Data Reviewed:  I have personally reviewed following labs and imaging studies  Micro Results Recent Results (from the past 240 hour(s))  SARS CORONAVIRUS 2  (TAT 6-24 HRS) Nasopharyngeal Nasopharyngeal Swab     Status: None   Collection Time: 12/27/18  4:17 AM   Specimen: Nasopharyngeal Swab  Result Value Ref Range Status   SARS Coronavirus 2 NEGATIVE NEGATIVE Final  Comment: (NOTE) SARS-CoV-2 target nucleic acids are NOT DETECTED. The SARS-CoV-2 RNA is generally detectable in upper and lower respiratory specimens during the acute phase of infection. Negative results do not preclude SARS-CoV-2 infection, do not rule out co-infections with other pathogens, and should not be used as the sole basis for treatment or other patient management decisions. Negative results must be combined with clinical observations, patient history, and epidemiological information. The expected result is Negative. Fact Sheet for Patients: SugarRoll.be Fact Sheet for Healthcare Providers: https://www.woods-mathews.com/ This test is not yet approved or cleared by the [place] FDA and  has been authorized for detection and/or diagnosis of SARS-CoV-2 by FDA under an Emergency Use Authorization (EUA). This EUA will remain  in effect (meaning this test can be used) for the duration of the COVID-19 declaration under Section 56 4(b)(1) of the Act, 21 U.S.C. section 360bbb-3(b)(1), unless the authorization is terminated or revoked sooner. Performed at [facility], [place] [address] [place], [place] [address]     Radiology Reports Dg Chest [place] 1 View  Result Date: 12/27/2018 CLINICAL DATA:  Shortness of breath EXAM: PORTABLE CHEST 1 VIEW COMPARISON:  09/08/2018 FINDINGS: Cardiomegaly with vascular congestion and diffuse interstitial opacities compatible with edema. Small bilateral effusions. Left base atelectasis or infiltrate. IMPRESSION: Cardiomegaly, mild pulmonary edema. Small bilateral effusions. Left lower lobe atelectasis or infiltrate. Electronically Signed   By: Rolm Baptise M.D.   On: 12/27/2018 03:52     Lab Data:  CBC: Recent Labs  Lab 12/27/18 0340 12/28/18 0310 12/29/18 0536 12/30/18 0342  WBC 10.7* 6.3 5.4 5.7  NEUTROABS 9.0*  --   --   --   HGB 12.7 10.6* 10.1* 11.1*  HCT 42.4 34.4* 33.7* 37.0  MCV 93.4 93.0 93.4 94.4  PLT 129* 118* 124* 897   Basic Metabolic Panel: Recent Labs  Lab 12/27/18 0340 12/28/18 0310 12/29/18 0536 12/30/18 0342  NA 142 145 142 141  K 4.0 3.0* 3.7 3.9  CL 106 104 106 104  CO2 24 32 26 26  GLUCOSE 160* 123* 122* 131*  BUN 12 19 24* 27*  CREATININE 0.74 0.79 0.79 1.22*  CALCIUM 8.5* 8.1* 8.3* 8.4*   GFR: Estimated Creatinine Clearance: 37.5 mL/min (A) (by C-G formula based on SCr of 1.22 mg/dL (H)). Liver Function Tests: Recent Labs  Lab 12/28/18 0310  AST 12*  ALT 12  ALKPHOS 71  BILITOT 1.2  PROT 6.1*  ALBUMIN 2.8*   No results for input(s): LIPASE, AMYLASE in the last 168 hours. No results for input(s): AMMONIA in the last 168 hours. Coagulation Profile: Recent Labs  Lab 12/27/18 0540 12/28/18 0310 12/29/18 0536 12/30/18 0342  INR 2.5* 2.6* 2.4* 2.3*   Cardiac Enzymes: No results for input(s): CKTOTAL, CKMB, CKMBINDEX, TROPONINI in the last 168 hours. BNP (last 3 results) No results for input(s): PROBNP in the last 8760 hours. HbA1C: No results for input(s): HGBA1C in the last 72 hours. CBG: No results for input(s): GLUCAP in the last 168 hours. Lipid Profile: No results for input(s): CHOL, HDL, LDLCALC, TRIG, CHOLHDL, LDLDIRECT in the last 72 hours. Thyroid Function Tests: No results for input(s): TSH, T4TOTAL, FREET4, T3FREE, THYROIDAB in the last 72 hours. Anemia Panel: No results for input(s): VITAMINB12, FOLATE, FERRITIN, TIBC, IRON, RETICCTPCT in the last 72 hours. Urine analysis:    Component Value Date/Time   COLORURINE YELLOW 09/08/2018 1711   APPEARANCEUR CLEAR 09/08/2018 1711   LABSPEC 1.010 09/08/2018 1711   PHURINE 6.0 09/08/2018 1711  GLUCOSEU NEGATIVE 09/08/2018 1711   HGBUR SMALL (A)  09/08/2018 1711   BILIRUBINUR NEGATIVE 09/08/2018 1711   KETONESUR NEGATIVE 09/08/2018 1711   PROTEINUR 30 (A) 09/08/2018 1711   UROBILINOGEN 0.2 04/02/2013 0554   NITRITE NEGATIVE 09/08/2018 1711   LEUKOCYTESUR SMALL (A) 09/08/2018 1711     Ripudeep Rai M.D. [facility] 12/30/2018, 3:25 PM   Call night coverage person covering after 7pm

## 2018-12-30 NOTE — Progress Notes (Addendum)
[facility] for Warfarin Indication: atrial fibrillation  Allergies  Allergen Reactions  . Codeine     NAUSEA  . Cymbalta [Duloxetine Hcl]     Nausea VOMITING AND ABDOMINAL PAIN, HEADACHE, JUST ABOUT EVERY SIDE EFFECT THE DRUG HAS  . Diltiazem Cd [Diltiazem Hcl Er Beads]     Heavy legs, cough  . Duloxetine     Other reaction(s): Other (See Comments) Nausea VOMITING AND ABDOMINAL PAIN, HEADACHE, JUST ABOUT EVERY SIDE EFFECT THE DRUG HAS  . Metoprolol     Legs like cement  . Nortriptyline     Nightmares  . Penicillins     RASH  & ITCHING   --PT STATES SHE CAN TAKE KEFLEX PO AND IV CEPHALOSPORINS  . Statins     Leg cramps  . Tetanus Toxoid Adsorbed Swelling  . Tetanus Toxoids     SWELLING, REDNESS  WHOLE ARM  . Ciprofloxacin Rash    RASH  . Lyrica [Pregabalin] Diarrhea, Nausea And Vomiting and Rash  . Penicillin G Rash    Patient Measurements: Height: 5' 2"  (157.5 cm) Weight: 202 lb 11.2 oz (91.9 kg) IBW/kg (Calculated) : 50.1 wt = 92 kg  Vital Signs: Temp: 98.4 F (36.9 C) (11/18 1235) Temp Source: Oral (11/18 1235) BP: 139/85 (11/18 1235) Pulse Rate: 94 (11/18 1235)  Labs: Recent Labs    12/28/18 0310 12/29/18 0536 12/30/18 0342  HGB 10.6* 10.1* 11.1*  HCT 34.4* 33.7* 37.0  PLT 118* 124* 151  LABPROT 27.8* 26.0* 25.3*  INR 2.6* 2.4* 2.3*  CREATININE 0.79 0.79 1.22*    Estimated Creatinine Clearance: 37.5 mL/min (A) (by C-G formula based on SCr of 1.22 mg/dL (H)).   Assessment: 67 yoF with Crohn's dz, HTN, HLD, carotid stenosis, PAF and Hx PE on warfarin, dCHF, admitted for CAP, AECHF, and AFib w/ RVR; COVID PUI. Pharmacy to dose warfarin while admitted.   Baseline INR therapeutic  Prior anticoagulation: warfarin 2.5 mg daily except 3.75 mg MWF; LD 11/14   Today, 12/30/2018:  CBC: Hgb 11.1, Plt 129>118>124>151  INR remains therapeutic  Major drug interactions: broad-spectrum abx  No bleeding  reported  Goal of Therapy: INR 2-3  Plan:  Resume home dose 2.5 qday X 3.75 MWF>>changed to 2.5 qday X 3.5 mg MWF while in hospital due to available tablets  Daily INR  Monitor for signs of bleeding or thrombosis  Eudelia Bunch, Pharm.D [phone] 12/30/2018 12:57 PM

## 2018-12-30 NOTE — Plan of Care (Signed)
  Problem: Education: Goal: Ability to demonstrate management of disease process will improve Outcome: Progressing Goal: Ability to verbalize understanding of medication therapies will improve Outcome: Progressing   Problem: Activity: Goal: Capacity to carry out activities will improve Outcome: Progressing   

## 2018-12-30 NOTE — Care Management Important Message (Signed)
Important Message  Patient Details IM Letter given to [name] RN to present to the Patient Name: [name] MRN: [id number] Date of Birth: 1934-11-18   Medicare Important Message Given:  Yes     Kerin Salen 12/30/2018, 1:05 PM

## 2018-12-31 LAB — BASIC METABOLIC PANEL
Anion gap: 10 (ref 5–15)
BUN: 23 mg/dL (ref 8–23)
CO2: 29 mmol/L (ref 22–32)
Calcium: 9 mg/dL (ref 8.9–10.3)
Chloride: 102 mmol/L (ref 98–111)
Creatinine, Ser: 0.82 mg/dL (ref 0.44–1.00)
GFR calc Af Amer: >60 mL/min (ref >60)
GFR calc non Af Amer: >60 mL/min (ref >60)
Glucose, Bld: 121 mg/dL — ABNORMAL HIGH (ref 70–99)
Potassium: 4.2 mmol/L (ref 3.5–5.1)
Sodium: 141 mmol/L (ref 135–145)

## 2018-12-31 LAB — CBC
HCT: 38.5 % (ref 36.0–46.0)
Hemoglobin: 11.3 g/dL — ABNORMAL LOW (ref 12.0–15.0)
MCH: 27.4 pg (ref 26.0–34.0)
MCHC: 29.4 g/dL — ABNORMAL LOW (ref 30.0–36.0)
MCV: 93.4 fL (ref 80.0–100.0)
Platelets: 150 10*3/uL (ref 150–400)
RBC: 4.12 MIL/uL (ref 3.87–5.11)
RDW: 16.2 % — ABNORMAL HIGH (ref 11.5–15.5)
WBC: 5.6 10*3/uL (ref 4.0–10.5)
nRBC: 0 % (ref 0.0–0.2)

## 2018-12-31 LAB — PROTIME-INR
INR: 2.5 — ABNORMAL HIGH (ref 0.8–1.2)
Prothrombin Time: 26.6 seconds — ABNORMAL HIGH (ref 11.4–15.2)

## 2018-12-31 MED ORDER — CEFPODOXIME PROXETIL 100 MG PO TABS: 100.0000 mg | ORAL_TABLET | Freq: Two times a day (BID) | ORAL | 0 refills | Status: AC

## 2018-12-31 MED ORDER — POTASSIUM CHLORIDE CRYS ER 10 MEQ PO TBCR: 20.0000 meq | EXTENDED_RELEASE_TABLET | Freq: Every day | ORAL | 1 refills | Status: AC

## 2018-12-31 MED ORDER — FUROSEMIDE 20 MG PO TABS: ORAL_TABLET | ORAL | 1 refills | Status: DC

## 2018-12-31 MED ORDER — AZITHROMYCIN 500 MG PO TABS: 500.0000 mg | ORAL_TABLET | Freq: Every day | ORAL | 0 refills | Status: AC

## 2018-12-31 NOTE — Progress Notes (Signed)
SATURATION QUALIFICATIONS: (This note is used to comply with regulatory documentation for home oxygen)  Patient Saturations on Room Air at Rest 93%  Patient Saturations on Room Air while Ambulating 85%  Patient Saturations on 3 Liters of oxygen while Ambulating 91%  Please briefly explain why patient needs home oxygen:  Pt became SOB while walking, tolerated ambulating with walker.   SRP, RN

## 2018-12-31 NOTE — TOC Initial Note (Addendum)
Transition of Care [facility]) - Initial/Assessment Note    Patient Details  Name: [name] MRN: [id number] Date of Birth: 1934/05/14  [facility] CM/SW Contact:    Purcell Mouton, RN Phone Number: 12/31/2018, 12:23 PM  Clinical Narrative:                  Pt is on home O2 with [employer]. Selected [facility] at home for HHRN/PT/OT. HHRN will be a late start related to [place]. Pt aware.  Expected Discharge Plan: [place] Barriers to Discharge: No Barriers Identified   Patient Goals and CMS Choice Patient states their goals for this hospitalization and ongoing recovery are:: to get better and go home CMS Medicare.gov Compare Post Acute Care list provided to:: Patient Choice offered to / list presented to : Patient  Expected Discharge Plan and Services Expected Discharge Plan: [place]       Living arrangements for the past 2 months: Single Family Home Expected Discharge Date: 12/31/18                         HH Arranged: PT [place] Agency: [facility] [facility] [employer] Date [place]: 12/31/18 Time [place]: 1222 Representative spoke with at [place]: [place] Arrangements/Services Living arrangements for the past 2 months: [place] Lives with:: Spouse Patient language and need for interpreter reviewed:: No Do you feel safe going back to the place where you live?: Yes          Current home services: Home PT    Activities of Daily Living Home Assistive Devices/Equipment: Cane (specify quad or straight), CPAP ADL Screening (condition at time of admission) Patient's cognitive ability adequate to safely complete daily activities?: Yes Is the patient deaf or have difficulty hearing?: No Does the patient have difficulty seeing, even when wearing glasses/contacts?: No Does the patient have difficulty concentrating, remembering, or making decisions?: No Patient  able to express need for assistance with ADLs?: Yes Does the patient have difficulty dressing or bathing?: No Independently performs ADLs?: Yes (appropriate for developmental age) Does the patient have difficulty walking or climbing stairs?: No Weakness of Legs: None Weakness of Arms/Hands: None  Permission Sought/Granted Permission sought to share information with : Case Manager                Emotional Assessment       Orientation: : Oriented to Self, Oriented to Place, Oriented to  Time      Admission diagnosis:  Acute on chronic congestive heart failure, unspecified heart failure type ([place]) [I50.9] Patient Active Problem List   Diagnosis Date Noted  . CHF (congestive heart failure) ([place]) 12/27/2018  . CAP (community acquired pneumonia) 12/27/2018  . Acute on chronic diastolic (congestive) heart failure ([place]) 07/14/2018  . Acute on chronic diastolic heart failure ([place]) 07/13/2018  . Lumbosacral spondylosis without myelopathy 09/26/2017  . Chronic low back pain 07/21/2017  . Degeneration of lumbar intervertebral disc 04/21/2017  . Lumbar post-laminectomy syndrome 04/21/2017  . Esophageal dysphagia 10/15/2016  . Gastroesophageal reflux disease without esophagitis 10/15/2016  . Seasonal allergic rhinitis 10/15/2016  . Chronic atrial fibrillation ([place]) 05/15/2015  . Aftercare following surgery of the circulatory system, [place] 09/15/2013  . Essential hypertension 06/17/2013  . Atrial fibrillation with RVR ([place]) 04/01/2013  . Morbid obesity ([place]) 04/01/2013  . OSA on CPAP 04/01/2013  . Chronic diastolic CHF (congestive  heart failure) ([place])   . H/o recurrent pulmonary embolism, on Coumadin 05/24/2012  . Acute on chronic diastolic congestive heart failure ([place]) 05/24/2012  . Normocytic anemia 05/24/2012  . Thrombocytopenia ([place]) 05/24/2012  . Inadequate anticoagulation 05/24/2012  . Elevated brain natriuretic peptide (BNP) level 05/24/2012  . Shoulder arthritis 05/15/2012   . Stress incontinence 01/06/2012  . Chronic cystitis 01/06/2012  . Occlusion and stenosis of carotid artery without mention of cerebral infarction 09/10/2011  . OA (osteoarthritis) of hip 06/17/2011  . Osteoarthritis 01/08/2011  . Fibromyalgia 08/06/2010  . Dyslipidemia 08/06/2010  . Gout 08/06/2010  . Right carotid artery occlusion 08/06/2010   PCP:  Deland Pretty, MD Pharmacy:   [facility], [place] [address] Phone: [phone] Fax: [phone]     Social Determinants of Health (SDOH) Interventions    Readmission Risk Interventions No flowsheet data found.

## 2018-12-31 NOTE — Evaluation (Signed)
Physical Therapy Evaluation Patient Details Name: [name] MRN: [id number] DOB: [birth year] Today's Date: 12/31/2018   History of Present Illness  84 y.o. female, w Crohn's disease, Gerd, PUD, hypertension, hyperlipidemia, Carotid stenosis, Pafib, CHF (diastolic), OSA on Cpap, h/o PE, presents with c/o fever 100.5, and cough w yellow sputum and sob. Dx of acute respiratory failure, CHF exacerbation, CAP.  Clinical Impression  Pt admitted with above diagnosis. Pt ambulated 100' with RW and 3L O2, SaO2 97% 1 minute after walking, 2/4 dyspnea, HR 108. Pt is safe to DC home from PT standpoint. HHPT recommended.  Pt currently with functional limitations due to the deficits listed below (see PT Problem List). Pt will benefit from skilled PT to increase their independence and safety with mobility to allow discharge to the venue listed below.       Follow Up Recommendations Home health PT    Equipment Recommendations  None recommended by PT    Recommendations for Other Services       Precautions / Restrictions Precautions Precautions: Other (comment) Precaution Comments: monitor O2; denies h/o falls in past 1 year Restrictions Weight Bearing Restrictions: No      Mobility  Bed Mobility               General bed mobility comments: up in recliner  Transfers Overall transfer level: Needs assistance Equipment used: Rolling walker (2 wheeled) Transfers: Sit to/from Stand Sit to Stand: Supervision         General transfer comment: good hand placement, supervision for safety  Ambulation/Gait Ambulation/Gait assistance: Supervision Gait Distance (Feet): 100 Feet Assistive device: Rolling walker (2 wheeled) Gait Pattern/deviations: Step-through pattern;Decreased stride length Gait velocity: decr   General Gait Details: steady with RW, no loss of balance, 2/4 dyspnea, HR 108, ambulated with 3L O2, SaO2 97% 1 minute after walking, (RN reported SaO2 85% on room air walking  earlier this morning)  Stairs            Wheelchair Mobility    Modified Rankin (Stroke Patients Only)       Balance Overall balance assessment: Modified Independent                                           Pertinent Vitals/Pain Pain Assessment: 0-10 Pain Score: 4  Pain Location: B knees/shoulders Pain Descriptors / Indicators: Aching Pain Intervention(s): Limited activity within patient's tolerance;Monitored during session    Home Living Family/patient expects to be discharged to:: Private residence Living Arrangements: Spouse/significant other Available Help at Discharge: Family   Home Access: Stairs to enter Entrance Stairs-Rails: Left Entrance Stairs-Number of Steps: 5   Home Equipment: Environmental consultant - 2 wheels;Walker - 4 wheels;Grab bars - tub/shower;Grab bars - toilet;Cane - single point      Prior Function Level of Independence: Independent with assistive device(s)         Comments: walks with SPC, husband supervises shower transfers     Hand Dominance        Extremity/Trunk Assessment   Upper Extremity Assessment Upper Extremity Assessment: Generalized weakness(h/o L TSA)    Lower Extremity Assessment Lower Extremity Assessment: Overall WFL for tasks assessed(B feet numb to light touch, this is baseline per pt)    Cervical / Trunk Assessment Cervical / Trunk Assessment: Normal  Communication   Communication: No difficulties  Cognition Arousal/Alertness: Awake/alert Behavior During Therapy: WFL for tasks assessed/performed  Overall Cognitive Status: Within Functional Limits for tasks assessed                                        General Comments      Exercises     Assessment/Plan    PT Assessment Patient needs continued PT services  PT Problem List Cardiopulmonary status limiting activity;Decreased activity tolerance       PT Treatment Interventions Gait training;Stair training;Therapeutic  activities;Patient/family education    PT Goals (Current goals can be found in the Care Plan section)  Acute Rehab PT Goals Patient Stated Goal: to walk farther PT Goal Formulation: With patient/family Time For Goal Achievement: 01/14/19 Potential to Achieve Goals: Good    Frequency Min 3X/week   Barriers to discharge        Co-evaluation               AM-PAC PT "6 Clicks" Mobility  Outcome Measure Help needed turning from your back to your side while in a flat bed without using bedrails?: A Little Help needed moving from lying on your back to sitting on the side of a flat bed without using bedrails?: A Little Help needed moving to and from a bed to a chair (including a wheelchair)?: None Help needed standing up from a chair using your arms (e.g., wheelchair or bedside chair)?: None Help needed to walk in hospital room?: None Help needed climbing 3-5 steps with a railing? : A Little 6 Click Score: 21    End of Session Equipment Utilized During Treatment: Gait belt;Oxygen Activity Tolerance: Patient tolerated treatment well Patient left: in chair;with chair alarm set Nurse Communication: Mobility status PT Visit Diagnosis: Difficulty in walking, not elsewhere classified (R26.2)    Time: 6283-6629 PT Time Calculation (min) (ACUTE ONLY): 23 min   Charges:   PT Evaluation $PT Eval Low Complexity: 1 Low PT Treatments $Gait Training: 8-22 mins        Blondell Reveal Kistler PT 12/31/2018  Acute Rehabilitation Services Pager [phone] Office [phone]

## 2018-12-31 NOTE — Progress Notes (Signed)
Pt discharged to home instructions reviewed with patient and spouse. Acknowledged understanding. SRP RN

## 2018-12-31 NOTE — Progress Notes (Signed)
[place] for Warfarin Indication: atrial fibrillation  Allergies  Allergen Reactions  . Codeine     NAUSEA  . Cymbalta [Duloxetine Hcl]     Nausea VOMITING AND ABDOMINAL PAIN, HEADACHE, JUST ABOUT EVERY SIDE EFFECT THE DRUG HAS  . Diltiazem Cd [Diltiazem Hcl Er Beads]     Heavy legs, cough  . Duloxetine     Other reaction(s): Other (See Comments) Nausea VOMITING AND ABDOMINAL PAIN, HEADACHE, JUST ABOUT EVERY SIDE EFFECT THE DRUG HAS  . Metoprolol     Legs like cement  . Nortriptyline     Nightmares  . Penicillins     RASH  & ITCHING   --PT STATES SHE CAN TAKE KEFLEX PO AND IV CEPHALOSPORINS  . Statins     Leg cramps  . Tetanus Toxoid Adsorbed Swelling  . Tetanus Toxoids     SWELLING, REDNESS  WHOLE ARM  . Ciprofloxacin Rash    RASH  . Lyrica [Pregabalin] Diarrhea, Nausea And Vomiting and Rash  . Penicillin G Rash    Patient Measurements: Height: 5' 2"  (157.5 cm) Weight: 201 lb 15.1 oz (91.6 kg) IBW/kg (Calculated) : 50.1 wt = 92 kg  Vital Signs: Temp: 97.8 F (36.6 C) (11/19 0520) Temp Source: Oral (11/19 0520) BP: 149/80 (11/19 0520) Pulse Rate: 72 (11/19 0520)  Labs: Recent Labs    12/29/18 0536 12/30/18 0342 12/31/18 0443  HGB 10.1* 11.1* 11.3*  HCT 33.7* 37.0 38.5  PLT 124* 151 150  LABPROT 26.0* 25.3* 26.6*  INR 2.4* 2.3* 2.5*  CREATININE 0.79 1.22* 0.82    Estimated Creatinine Clearance: 55.7 mL/min (by C-G formula based on SCr of 0.82 mg/dL).   Assessment: 31 yoF with Crohn's dz, HTN, HLD, carotid stenosis, PAF and Hx PE on warfarin, dCHF, admitted for CAP, AECHF, and AFib w/ RVR; COVID PUI. Pharmacy to dose warfarin while admitted.   Baseline INR therapeutic  Prior anticoagulation: warfarin 2.5 mg daily except 3.75 mg MWF; LD 11/14   Today, 12/31/2018:  CBC: stable  INR remains therapeutic  Major drug interactions: broad-spectrum abx D#5  No bleeding reported  Goal of Therapy: INR  2-3  Plan:  continue home dose 2.5 qday X 3.75 MWF>>changed to 2.5 qday X 3.5 mg MWF while in hospital due to available tablets. Rec to DC on PTA dose  Daily INR  Monitor for signs of bleeding or thrombosis  Eudelia Bunch, Pharm.D [phone] 12/31/2018 10:19 AM

## 2018-12-31 NOTE — TOC Progression Note (Signed)
Transition of Care [facility]) - Progression Note    Patient Details  Name: [name] MRN: [id number] Date of Birth: 19-Dec-1934  [facility]) CM/SW Contact  Purcell Mouton, RN Phone Number: 12/31/2018, 12:20 PM  Clinical Narrative:     Pt selected [facility] home referral given to in house rep [place].       Expected Discharge Plan and Services           Expected Discharge Date: 12/31/18                                     Social Determinants of Health (SDOH) Interventions    Readmission Risk Interventions No flowsheet data found.

## 2018-12-31 NOTE — Discharge Summary (Signed)
Physician Discharge Summary   Patient ID: [name] MRN: [id number] DOB/AGE: 82/15/1936 82 y.o.  Admit date: 12/27/2018 Discharge date: 12/31/2018  Primary Care Physician:  Bonnie Pretty, MD   Recommendations for Outpatient Follow-up:  1. Follow up with PCP in 1-2 weeks  Home Health: Home health PT, RN Equipment/Devices:   Discharge Condition: stable  CODE STATUS: FULL  Diet recommendation: Heart healthy diet   Discharge Diagnoses:    . Acute on chronic diastolic congestive heart failure ([place]) . CAP (community acquired pneumonia) . Chronic atrial fibrillation ([place]) . Atrial fibrillation with RVR ([facility]) Acute respiratory failure with hypoxia Hypokalemia Hypertension gerd GOUT  Consults: Cardiology    Allergies:   Allergies  Allergen Reactions  . Codeine     NAUSEA  . Cymbalta [Duloxetine Hcl]     Nausea VOMITING AND ABDOMINAL PAIN, HEADACHE, JUST ABOUT EVERY SIDE EFFECT THE DRUG HAS  . Diltiazem Cd [Diltiazem Hcl Er Beads]     Heavy legs, cough  . Duloxetine     Other reaction(s): Other (See Comments) Nausea VOMITING AND ABDOMINAL PAIN, HEADACHE, JUST ABOUT EVERY SIDE EFFECT THE DRUG HAS  . Metoprolol     Legs like cement  . Nortriptyline     Nightmares  . Penicillins     RASH  & ITCHING   --PT STATES SHE CAN TAKE KEFLEX PO AND IV CEPHALOSPORINS  . Statins     Leg cramps  . Tetanus Toxoid Adsorbed Swelling  . Tetanus Toxoids     SWELLING, REDNESS  WHOLE ARM  . Ciprofloxacin Rash    RASH  . Lyrica [Pregabalin] Diarrhea, Nausea And Vomiting and Rash  . Penicillin G Rash     DISCHARGE MEDICATIONS: Allergies as of 12/31/2018      Reactions   Codeine    NAUSEA   Cymbalta [duloxetine Hcl]    Nausea VOMITING AND ABDOMINAL PAIN, HEADACHE, JUST ABOUT EVERY SIDE EFFECT THE DRUG HAS   Diltiazem Cd [diltiazem Hcl Er Beads]    Heavy legs, cough   Duloxetine    Other reaction(s): Other (See Comments) Nausea VOMITING AND ABDOMINAL PAIN, HEADACHE,  JUST ABOUT EVERY SIDE EFFECT THE DRUG HAS   Metoprolol    Legs like cement   Nortriptyline    Nightmares   Penicillins    RASH  & ITCHING   --PT STATES SHE CAN TAKE KEFLEX PO AND IV CEPHALOSPORINS   Statins    Leg cramps   Tetanus Toxoid Adsorbed Swelling   Tetanus Toxoids    SWELLING, REDNESS  WHOLE ARM   Ciprofloxacin Rash   RASH   Lyrica [pregabalin] Diarrhea, Nausea And Vomiting, Rash   Penicillin G Rash      Medication List    STOP taking these medications   celecoxib 100 MG capsule Commonly known as: CELEBREX   potassium chloride 10 MEQ tablet Commonly known as: KLOR-CON     TAKE these medications   allopurinol 300 MG tablet Commonly known as: ZYLOPRIM Take 300 mg by mouth daily.   amLODipine 5 MG tablet Commonly known as: NORVASC Take 5 mg by mouth daily.   azithromycin 500 MG tablet Commonly known as: ZITHROMAX Take 1 tablet (500 mg total) by mouth daily for 3 days. Start taking on: January 01, 2019   brimonidine 0.2 % ophthalmic solution Commonly known as: ALPHAGAN 1 drop 2 (two) times daily.   cefpodoxime 100 MG tablet Commonly known as: VANTIN Take 1 tablet (100 mg total) by mouth 2 (two) times daily for 3  days.   cetirizine 10 MG tablet Commonly known as: ZYRTEC Take 10 mg by mouth daily.   cimetidine 200 MG tablet Commonly known as: TAGAMET Take 200 mg by mouth 2 (two) times daily.   CRANBERRY CONCENTRATE PO Take 1 tablet by mouth daily.   dorzolamide-timolol 22.3-6.8 MG/ML ophthalmic solution Commonly known as: COSOPT Place 1 drop into both eyes 2 (two) times daily.   Fluzone High-Dose Quadrivalent 0.7 ML Susy Generic drug: Influenza Vac High-Dose Quad ADM 0.7ML IM UTD   furosemide 20 MG tablet Commonly known as: LASIX Take 1 tab (61m)  daily for a week, then continue 1/2 tab (127m daily What changed: See the new instructions.   HYDROcodone-acetaminophen 5-325 MG tablet Commonly known as: NORCO/VICODIN Take 0.5-1 tablets by  mouth 3 (three) times daily as needed for pain.   ipratropium 0.06 % nasal spray Commonly known as: ATROVENT Place 1 spray into both nostrils daily as needed (allergies).   Lumigan 0.01 % Soln Generic drug: bimatoprost Place 1 drop into both eyes at bedtime.   mupirocin ointment 2 % Commonly known as: BACTROBAN Apply 1 application topically 2 (two) times daily.   Nasacort Allergy 24HR 55 MCG/ACT Aero nasal inhaler Generic drug: triamcinolone Place 2 sprays into the nose daily as needed (allergies).   nebivolol 10 MG tablet Commonly known as: BYSTOLIC Take 1 tablet (10 mg total) by mouth at bedtime.   potassium chloride 10 MEQ tablet Commonly known as: KLOR-CON Take 2 tablets (20 mEq total) by mouth daily. What changed: additional instructions   silver sulfADIAZINE 1 % cream Commonly known as: Silvadene Apply 1 application topically daily.   traZODone 50 MG tablet Commonly known as: DESYREL Take 25 mg by mouth at bedtime.   warfarin 2.5 MG tablet Commonly known as: COUMADIN Take as directed. If you are unsure how to take this medication, talk to your nurse or doctor. Original instructions: TAKE 1 TO 1&1/2 TABLETS DAILY AS DIRECTED BY COUMADIN CLINIC. What changed: See the new instructions.        Brief H and P: For complete details please refer to admission H and P, but in brief  [name] a84 y.o.female,w Crohn's disease, Gerd, PUD, hypertension, hyperlipidemia, Carotid stenosis, Pafib, CHF (diastolic), OSA on Cpap, h/o PE, presents with c/ofever 100.5, and cough w yellow sputum and sob starting yesterday. Pt thinks gained 1.5 lbs. Slight swelling of her ankles, Left >right. Pt denies cp, palp, orthopnea, pnd. N/v, abd pain, diarrhea, brbpr. Pt denies any covid-19 exposure   Hospital Course:  Acute respiratory failure with hypoxia in the setting of acute on chronic diastolic CHF, POA -Slowly improving.  Patient was placed on IV Lasix for diuresis.   Negative balance of 1.3 L at the time of discharge -2D echo 07/2018 had shown EF of 55 to 60% -Cardiology was consulted, outpatient cardiologist Dr. JoMartinique   Seen by cardiology, Dr. NaAcie Fredricksonppears to be close to her baseline, well diuresed, change Lasix to p.o. -Recommended Lasix 20 mg daily for at least a week then reduce to 10 mg daily with extra 10 mg as needed for weight gain, K-Dur 10 mg twice daily  Permanent atrial fibrillation with RVR -Heart rate now controlled, likely worsened due to CHF, Pneumonia -On Coumadin   Pneumonia, CAP, POA -COVID-19 test negative.  Chest x-ray showed questionable right lower lobe infiltrate.  Blood cultures negative so far.  Urine strep antigen negative. -Continue Zithromax, Rocephin for another 3 days to complete full course -Home O2 evaluation  prior to discharge, patient has O2 at home, declined oxygen tank   Hypokalemia Resolved  Hypertension BP currently stable, continue amlodipine, Bystolic  Gout Continue allopurinol  GERD -Continue Tagamet  OSA  Day of Discharge S: No acute complaints, feeling a lot better from admission, still a little weak  BP (!) 112/95 (BP Location: Left Arm)   Pulse 91   Temp 97.8 F (36.6 C) (Oral)   Resp 18   Ht 5' 2"  (1.575 m)   Wt 91.6 kg   SpO2 92%   BMI 36.94 kg/m   Physical Exam: General: Alert and awake oriented x3 not in any acute distress. HEENT: anicteric sclera, pupils reactive to light and accommodation CVS: S1-S2 clear no murmur rubs or gallops Chest: clear to auscultation bilaterally, no wheezing rales or rhonchi Abdomen: soft nontender, nondistended, normal bowel sounds Extremities: no cyanosis, clubbing or edema noted bilaterally Neuro: Cranial nerves II-XII intact, no focal neurological deficits   The results of significant diagnostics from this hospitalization (including imaging, microbiology, ancillary and laboratory) are listed below for reference.       Procedures/Studies:  Dg Chest Port 1 View  Result Date: 12/27/2018 CLINICAL DATA:  Shortness of breath EXAM: PORTABLE CHEST 1 VIEW COMPARISON:  09/08/2018 FINDINGS: Cardiomegaly with vascular congestion and diffuse interstitial opacities compatible with edema. Small bilateral effusions. Left base atelectasis or infiltrate. IMPRESSION: Cardiomegaly, mild pulmonary edema. Small bilateral effusions. Left lower lobe atelectasis or infiltrate. Electronically Signed   By: Rolm Baptise M.D.   On: 12/27/2018 03:52       LAB RESULTS: Basic Metabolic Panel: Recent Labs  Lab 12/30/18 0342 12/31/18 0443  NA 141 141  K 3.9 4.2  CL 104 102  CO2 26 29  GLUCOSE 131* 121*  BUN 27* 23  CREATININE 1.22* 0.82  CALCIUM 8.4* 9.0   Liver Function Tests: Recent Labs  Lab 12/28/18 0310  AST 12*  ALT 12  ALKPHOS 71  BILITOT 1.2  PROT 6.1*  ALBUMIN 2.8*   No results for input(s): LIPASE, AMYLASE in the last 168 hours. No results for input(s): AMMONIA in the last 168 hours. CBC: Recent Labs  Lab 12/27/18 0340  12/30/18 0342 12/31/18 0443  WBC 10.7*   < > 5.7 5.6  NEUTROABS 9.0*  --   --   --   HGB 12.7   < > 11.1* 11.3*  HCT 42.4   < > 37.0 38.5  MCV 93.4   < > 94.4 93.4  PLT 129*   < > 151 150   < > = values in this interval not displayed.   Cardiac Enzymes: No results for input(s): CKTOTAL, CKMB, CKMBINDEX, TROPONINI in the last 168 hours. BNP: Invalid input(s): POCBNP CBG: No results for input(s): GLUCAP in the last 168 hours.    Disposition and Follow-up: Discharge Instructions    (HEART FAILURE PATIENTS) Call MD:  Anytime you have any of the following symptoms: 1) 3 pound weight gain in 24 hours or 5 pounds in 1 week 2) shortness of breath, with or without a dry hacking cough 3) swelling in the hands, feet or stomach 4) if you have to sleep on extra pillows at night in order to breathe.   Complete by: As directed    Diet - low sodium heart healthy   Complete by: As  directed    Discharge instructions   Complete by: As directed    Take Lasix 20 mg (1 tab) daily for a week, then continue maintenance dose  of 10 mg (1/2 tab)daily.  Please take extra 10 mg Lasix if weight gain, >3lbs in 24 hours or shortness of breath or swelling in the hands, feet or stomach.   Increase activity slowly   Complete by: As directed        DISPOSITION: Home with home health   DISCHARGE FOLLOW-UP Follow-up Information    [facility] Follow up on 01/01/2019.   Specialty: Cardiology Why: Please arrive 15 minutes early for your 11:15am coumadin clinic appointment Contact information: [place] [phone]       Lendon Colonel, NP Follow up on 01/12/2019.   Specialties: Nurse Practitioner, Radiology, Cardiology Why: Please arrive 15 minutes early for your 8am post-hospital cardiology follow-up appointment Contact information: [address] [place] [address] [phone]        Bonnie Pretty, MD. Schedule an appointment as soon as possible for a visit in 2 week(s).   Specialty: Internal Medicine Contact information: [address] [place] [address] [phone]        Martinique, Peter M, MD .   Specialty: Cardiology Contact information: [address] [place] [address] [phone]        [facility] Follow up.   Specialty: Home Health Services Why: Will call you 48 after discharge, please call if you have not heard from them.  Contact information: [address] [place] [address] [phone]            Time coordinating discharge:  35 minutes  Signed:   Estill Cotta M.D. [facility] 12/31/2018, 6:21 PM

## 2018-12-31 NOTE — Plan of Care (Signed)
  Problem: Education: Goal: Ability to demonstrate management of disease process will improve Outcome: Adequate for Discharge Goal: Ability to verbalize understanding of medication therapies will improve Outcome: Adequate for Discharge   Problem: Activity: Goal: Capacity to carry out activities will improve Outcome: Adequate for Discharge   Problem: Cardiac: Goal: Ability to achieve and maintain adequate cardiopulmonary perfusion will improve Outcome: Adequate for Discharge   Problem: Acute Rehab PT Goals(only PT should resolve) Goal: Pt Will Ambulate Outcome: Adequate for Discharge Goal: Pt Will Go Up/Down Stairs Outcome: Adequate for Discharge Goal: Pt/caregiver will Perform Home Exercise Program Outcome: Adequate for Discharge

## 2019-01-01 NOTE — Telephone Encounter (Signed)
Patient contacted regarding discharge from [place] [facility] on 12/31/18.  Patient understands to follow up with provider Jory Sims NP on 01/12/19 at 8;00 AM at [facility]. Patient understands discharge instructions? YES Patient understands medications and regiment? YES Patient understands to bring all medications to this visit? YES   PT WAS WANTING TO MAKE SURE SHE DID NOT NEED TO COME IN FOR INR TODAY STAFF MESSAGE SENT TO PHARMACISTS

## 2019-01-03 DIAGNOSIS — Z96612 Presence of left artificial shoulder joint: Secondary | ICD-10-CM | POA: Diagnosis not present

## 2019-01-03 DIAGNOSIS — I110 Hypertensive heart disease with heart failure: Secondary | ICD-10-CM | POA: Diagnosis not present

## 2019-01-03 DIAGNOSIS — Z87891 Personal history of nicotine dependence: Secondary | ICD-10-CM | POA: Diagnosis not present

## 2019-01-03 DIAGNOSIS — I4819 Other persistent atrial fibrillation: Secondary | ICD-10-CM | POA: Diagnosis not present

## 2019-01-03 DIAGNOSIS — I5033 Acute on chronic diastolic (congestive) heart failure: Secondary | ICD-10-CM | POA: Diagnosis not present

## 2019-01-03 DIAGNOSIS — J9601 Acute respiratory failure with hypoxia: Secondary | ICD-10-CM | POA: Diagnosis not present

## 2019-01-03 DIAGNOSIS — I2510 Atherosclerotic heart disease of native coronary artery without angina pectoris: Secondary | ICD-10-CM | POA: Diagnosis not present

## 2019-01-03 DIAGNOSIS — Z7901 Long term (current) use of anticoagulants: Secondary | ICD-10-CM | POA: Diagnosis not present

## 2019-01-03 DIAGNOSIS — Z86711 Personal history of pulmonary embolism: Secondary | ICD-10-CM | POA: Diagnosis not present

## 2019-01-03 DIAGNOSIS — K5090 Crohn's disease, unspecified, without complications: Secondary | ICD-10-CM | POA: Diagnosis not present

## 2019-01-03 DIAGNOSIS — G4733 Obstructive sleep apnea (adult) (pediatric): Secondary | ICD-10-CM | POA: Diagnosis not present

## 2019-01-03 DIAGNOSIS — M797 Fibromyalgia: Secondary | ICD-10-CM | POA: Diagnosis not present

## 2019-01-03 DIAGNOSIS — I739 Peripheral vascular disease, unspecified: Secondary | ICD-10-CM | POA: Diagnosis not present

## 2019-01-03 DIAGNOSIS — E785 Hyperlipidemia, unspecified: Secondary | ICD-10-CM | POA: Diagnosis not present

## 2019-01-03 DIAGNOSIS — J189 Pneumonia, unspecified organism: Secondary | ICD-10-CM | POA: Diagnosis not present

## 2019-01-03 DIAGNOSIS — K219 Gastro-esophageal reflux disease without esophagitis: Secondary | ICD-10-CM | POA: Diagnosis not present

## 2019-01-04 ENCOUNTER — Other Ambulatory Visit: Payer: Self-pay | Admitting: *Deleted

## 2019-01-04 ENCOUNTER — Telehealth: Payer: Self-pay | Admitting: Pharmacist

## 2019-01-04 NOTE — Patient Outreach (Signed)
[facility]) Care Management  01/04/2019  [name] 05-21-1934 [id number]   RED ON EMMI ALERT - General Discharge Day # 1 Date: 01/02/2019 Red Alert Reason: Other questions or problems   Outreach attempt # 1, successful.  Referral received from care management assistant for above reason.  Call placed to member to follow up on recent discharge.  She was admitted to hospital for CHF, also has history of A-fib, OSA, GERD, OA, and fibromyalgia.  MD office completed TOC assessment.  Member's identity verified, this care manager introduced self and stated purpose of call, [facility] care management services explained.  She report she is doing well, denies having questions about her discharge or overall management of her health conditions.  State she has home health involved, lives with husband, has other family/friends for support.  Denies needing [facility] assistance.  Attempts to explain benefits of services again, declines involvement, denies questions/concerns about care.    Plan: RN CM will close case at this time due to no further needs identified.  Valente David, [place], MSN [place] [phone]

## 2019-01-04 NOTE — Telephone Encounter (Signed)
Medication Samples have been provided to the patient.  Drug name: BYSTOLIC     Strength: 05WK      Qty: 28 LOT: [id number]  Exp.Date: 03/2020  Detrice Cales Rodriguez-Guzman PharmD, BCPS, [place] [address] [place] [address] 01/04/2019 11:02 AM

## 2019-01-05 ENCOUNTER — Ambulatory Visit: Payer: Medicare Other | Admitting: Podiatry

## 2019-01-08 ENCOUNTER — Other Ambulatory Visit: Payer: Self-pay | Admitting: Cardiology

## 2019-01-09 DIAGNOSIS — I2510 Atherosclerotic heart disease of native coronary artery without angina pectoris: Secondary | ICD-10-CM | POA: Diagnosis not present

## 2019-01-09 DIAGNOSIS — J189 Pneumonia, unspecified organism: Secondary | ICD-10-CM | POA: Diagnosis not present

## 2019-01-09 DIAGNOSIS — I110 Hypertensive heart disease with heart failure: Secondary | ICD-10-CM | POA: Diagnosis not present

## 2019-01-09 DIAGNOSIS — I4819 Other persistent atrial fibrillation: Secondary | ICD-10-CM | POA: Diagnosis not present

## 2019-01-09 DIAGNOSIS — J9601 Acute respiratory failure with hypoxia: Secondary | ICD-10-CM | POA: Diagnosis not present

## 2019-01-09 DIAGNOSIS — I5033 Acute on chronic diastolic (congestive) heart failure: Secondary | ICD-10-CM | POA: Diagnosis not present

## 2019-01-11 DIAGNOSIS — J9601 Acute respiratory failure with hypoxia: Secondary | ICD-10-CM | POA: Diagnosis not present

## 2019-01-11 DIAGNOSIS — J189 Pneumonia, unspecified organism: Secondary | ICD-10-CM | POA: Diagnosis not present

## 2019-01-11 DIAGNOSIS — I110 Hypertensive heart disease with heart failure: Secondary | ICD-10-CM | POA: Diagnosis not present

## 2019-01-11 DIAGNOSIS — I2510 Atherosclerotic heart disease of native coronary artery without angina pectoris: Secondary | ICD-10-CM | POA: Diagnosis not present

## 2019-01-11 DIAGNOSIS — I4819 Other persistent atrial fibrillation: Secondary | ICD-10-CM | POA: Diagnosis not present

## 2019-01-11 DIAGNOSIS — I5033 Acute on chronic diastolic (congestive) heart failure: Secondary | ICD-10-CM | POA: Diagnosis not present

## 2019-01-11 NOTE — Progress Notes (Signed)
Cardiology Office Note   Date:  01/12/2019   ID:  [name], DOB 16-Oct-1934, MRN [id number]  PCP:  Deland Pretty, MD  Cardiologist: Dr. Martinique CC: TOC Follow Up    History of Present Illness: [name] is a 84 y.o. female who presents for post hospital follow up after admission on 12/27/2018 for acute on chronic diastolic CHF.  She was seen by Dr. Acie Fredrickson on consultation on 12/30/2018.  Mrs. DiSanto has a history of nonobstructive CAD, hypertension, hyperlipidemia, carotid artery disease followed by Dr. Curt Jews, with other history to include OSA on CPAP, Crohn's disease, and GERD.  The patient was diagnosed with right lower lobe pneumonia, she was also found to be in atrial fib with RVR, and felt to be volume overloaded driven by underlying infection.  She was diuresed with IV Lasix.  She remained hypertensive despite Bystolic and amlodipine.  She was treated with IV antibiotics for pneumonia.  After IV diuresis she was to transition to Lasix 20 mg p.o. daily x7 days with potassium 20 mEq daily and then Lasix 10 mg daily going forward thereafter.  She was also to continue her Bystolic.  For atrial fib the patient was to continue on Coumadin for stroke prophylaxis, and to follow-up in Coumadin clinic on 01/01/2019, rate was controlled on Bystolic.  Her blood pressure remained persistently elevated despite home amlodipine and Bystolic dosing and therefore will need to be followed up today concerning medication management.  She comes today stable from a cardiac standpoint. She has multiple complaints of musculoskeletal pain in knees and hips, causing her not to ambulate very much.  She remains on O2 at home.  She denies bleeding or bruising on coumadin. She will be seen by them today during this appointment.   Past Medical History:  Diagnosis Date  . Atrial fibrillation ([place])   . Carotid artery occlusion   . Chronic diastolic CHF (congestive heart failure) (HCC)    Hypertensive heart  disease 02-12-14  . Crohn's disease ([place])   . Detached retina   . Fibromyalgia   . GERD (gastroesophageal reflux disease)   . Gout   . H/O hiatal hernia   . High triglycerides   . History of stomach ulcers 1950's  . Hypertension   . Long term current use of anticoagulant   . Obstructive sleep apnea on CPAP   . Osteoarthritis    PAIN AND OA LEF T HIP AND BOTH SHOULDERS ARE BONE ON BONE AND PAINFUL  . Peripheral vascular disease (HCC)    KNOWN RIGHT INTERNAL CAROTID ARTERY OCCLUSION (NO STROKE)  --40 TO 59% STENOSIS LEFT ICA-FOLLOWED BY DR. EARLY WITH DOPPLER STUDY EVERY 6 MONTHS  . Pulmonary embolism ([place]) 2011   a. Hx of PE in 02/2009 after R hip surgery, venous dopplers negative, long-term Coumadin.  [name] Kitchen PVC (premature ventricular contraction)    PT STATES HX OF PVC'S ON EKG  . Recurrent upper respiratory infection (URI)    BRONCHITIS FEB 2013--SLIGHT COUGH NON-PRODUCTIVE NOW  . Recurrent UTI (urinary tract infection)    "on daily medicine" (05/14/2012)  . Tinnitus   . Vertigo     Past Surgical History:  Procedure Laterality Date  . APPENDECTOMY  1950's  . BACK SURGERY  10/29/06   Central and foraminal decompression L3-L4, L4-L5, and L5-S1 with inspection of L4-L5 and L5-S1 disc on the right  . CARDIAC CATHETERIZATION  1970's   "maybe 2" (05/14/2012)  . CARDIAC CATHETERIZATION    . CATARACT EXTRACTION W/  INTRAOCULAR LENS IMPLANT Right 2009  . CHOLECYSTECTOMY  ~ 1988  . DECOMPRESSIVE LUMBAR LAMINECTOMY LEVEL 3  ~ 2002  . DILATION AND CURETTAGE OF UTERUS     "several; from miscarriages" (05/14/2012)  . EXCISION MORTON'S NEUROMA Right 05/20/00   "foot" (05/14/2012)  . EYE SURGERY Left 2014   Detached retina  . EYE SURGERY Left August 23, 2013   Cataract  . FEMUR FRACTURE SURGERY Right 02/21/09   Open reduction internal fixation of right periprosthetic  femur fracture utilizing Zimmer cables times fiv  . FRACTURE SURGERY  2011   Right Femur Fx  . HAMMER TOE SURGERY Left 10/25/08   "toe  next to big to" (05/14/2012)  . JOINT REPLACEMENT  2009   Right Hip replacement  . JOINT REPLACEMENT  2012   Right knee replacement  . JOINT REPLACEMENT  06/17/11   Left Hip replacement  . KNEE ARTHROSCOPY Right 07/26/05  . REPLACEMENT TOTAL KNEE Right 02/19/2010  . SPINE SURGERY    . [place]?  . TOTAL HIP ARTHROPLASTY Right 12/08/02   Osteonics total hip replacement  . TOTAL HIP ARTHROPLASTY  06/17/2011   Procedure: TOTAL HIP ARTHROPLASTY;  Surgeon: Gearlean Alf, MD;  Location: [facility];  Service: Orthopedics;  Laterality: Left;  . TOTAL SHOULDER ARTHROPLASTY Left 05/14/2012  . TOTAL SHOULDER ARTHROPLASTY Left 05/14/2012   Procedure: TOTAL SHOULDER ARTHROPLASTY;  Surgeon: Marin Shutter, MD;  Location: [facility];  Service: Orthopedics;  Laterality: Left;  [name] Kitchen VAGINAL HYSTERECTOMY  1971     Current Outpatient Medications  Medication Sig Dispense Refill  . allopurinol (ZYLOPRIM) 300 MG tablet Take 300 mg by mouth daily.    [name] Kitchen amLODipine (NORVASC) 5 MG tablet Take 5 mg by mouth daily.    . brimonidine (ALPHAGAN) 0.2 % ophthalmic solution 1 drop 2 (two) times daily.     . cetirizine (ZYRTEC) 10 MG tablet Take 10 mg by mouth daily.    . cimetidine (TAGAMET) 200 MG tablet Take 200 mg by mouth 2 (two) times daily.    [name] Kitchen CRANBERRY CONCENTRATE PO Take 1 tablet by mouth daily.    . dorzolamide-timolol (COSOPT) 22.3-6.8 MG/ML ophthalmic solution Place 1 drop into both eyes 2 (two) times daily.     [name] Kitchen FLUZONE HIGH-DOSE QUADRIVALENT 0.7 ML SUSY ADM 0.7ML IM UTD    . furosemide (LASIX) 20 MG tablet Take 1 tab (47m)  daily for a week, then continue 1/2 tab (155m daily 90 tablet 1  . HYDROcodone-acetaminophen (NORCO/VICODIN) 5-325 MG tablet Take 0.5-1 tablets by mouth 3 (three) times daily as needed for pain.     . [name] Kitchenpratropium (ATROVENT) 0.06 % nasal spray Place 1 spray into both nostrils daily as needed (allergies).     . [name] KitchenUMIGAN 0.01 % SOLN Place 1 drop into both eyes at bedtime.     . mupirocin ointment (BACTROBAN) 2 % Apply 1 application topically 2 (two) times daily. 30 g 2  . nebivolol (BYSTOLIC) 10 MG tablet Take 1 tablet (10 mg total) by mouth at bedtime. 90 tablet 3  . potassium chloride (KLOR-CON) 10 MEQ tablet Take 2 tablets (20 mEq total) by mouth daily. 60 tablet 1  . silver sulfADIAZINE (SILVADENE) 1 % cream Apply 1 application topically daily. 50 g 0  . triamcinolone (NASACORT ALLERGY 24HR) 55 MCG/ACT AERO nasal inhaler Place 2 sprays into the nose daily as needed (allergies).     . warfarin (COUMADIN) 2.5 MG tablet TAKE 1 TO 1&1/2 TABLETS DAILY AS  DIRECTED BY COUMADIN CLINIC. 120 tablet 0   No current facility-administered medications for this visit.     Allergies:   Codeine, Cymbalta [duloxetine hcl], Diltiazem cd [diltiazem hcl er beads], Duloxetine, Metoprolol, Nortriptyline, Penicillins, Statins, Tetanus toxoid adsorbed, Tetanus toxoids, Ciprofloxacin, Lyrica [pregabalin], and Penicillin g    Social History:  The patient  reports that she quit smoking about 32 years ago. Her smoking use included cigarettes. She has a 7.50 pack-year smoking history. She has never used smokeless tobacco. She reports current alcohol use. She reports that she does not use drugs.   Family History:  The patient's family history includes AAA (abdominal aortic aneurysm) in her mother; Aneurysm in her mother; Diabetes in her paternal grandfather and son; Heart attack in her brother; Heart attack (age of onset: 27) in her father; Heart disease in her brother, father, and mother; Hyperlipidemia in her brother; Hypertension in her brother, brother, father, and mother; Peripheral vascular disease in her father; Stroke in her mother.    ROS: All other systems are reviewed and negative. Unless otherwise mentioned in H&P    PHYSICAL EXAM: VS:  BP (!) 142/90   Pulse 96   Ht 5' 2"  (1.575 m)   Wt 200 lb (90.7 kg)   BMI 36.58 kg/m  , BMI Body mass index is 36.58 kg/m. GEN: Well  nourished, well developed, in no acute distress HEENT: normal Neck: no JVD, carotid bruits, or masses Cardiac: RRR; no murmurs, rubs, or gallops,no edema  Respiratory:  Clear to auscultation bilaterally, normal work of breathing GI: soft, nontender, nondistended, + BS MS: no deformity or atrophy Skin: warm and dry, no rash Neuro:  Strength and sensation are intact Psych: euthymic mood, full affect   EKG: Atrial fibrillation, HR of 96 bpm. ST flattening anteriorly, with T-wave depression infero/laterally.   Recent Labs: 09/08/2018: Magnesium 1.3 12/27/2018: B Natriuretic Peptide 300.3 12/28/2018: ALT 12 12/31/2018: BUN 23; Creatinine, Ser 0.82; Hemoglobin 11.3; Platelets 150; Potassium 4.2; Sodium 141    Lipid Panel No results found for: CHOL, TRIG, HDL, CHOLHDL, VLDL, LDLCALC, LDLDIRECT    Wt Readings from Last 3 Encounters:  01/12/19 200 lb (90.7 kg)  12/31/18 201 lb 15.1 oz (91.6 kg)  12/16/18 203 lb (92.1 kg)      Other studies Reviewed: Echocardiogram 2018-08-22: 1. The left ventricle has normal systolic function, with an ejection fraction of 55-60%. The cavity size was normal. Left ventricular diastolic function could not be evaluated secondary to atrial fibrillation. 2. The right ventricle has normal systolic function. The cavity was normal. There is no increase in right ventricular wall thickness. Right ventricular systolic pressure is normal with an estimated pressure of 27.9 mmHg. 3. Left atrial size was severely dilated. 4. Right atrial size was moderately dilated. 5. Trivial pericardial effusion is present. 6. The aortic root and ascending aorta are normal in size and structure. 7. The inferior vena cava was dilated in size with >50% respiratory variability.   ASSESSMENT AND PLAN:  1. Atrial fibrillation: Heart rate is well controlled today. Medications without issues. She will be seen by the pharmacist today for coumadin dosing with INR.   2. Chronic  Diastolic CHF:  No evidence of fluid overload. Weight has been stable. Continue lasix as directed. Samples of bisoprolol are provided.   3. Hypertension: BP is elevated today. She has not taken her medications this am prior to coming to the office.   4. OSA:  Continues on CPAP at HS and also wears 02 via  [place] during the day via O2 concentrator.   5.Carotid Artery Disease:  Right carotid artery occlusion. Followed by Dr.Todd Early.    Current medicines are reviewed at length with the patient today.    Labs/ tests ordered today include: None   Phill Myron. West Pugh, ANP, AACC   01/12/2019 10:36 AM    [facility] [address] Office [phone] Fax [phone]  Notice: This dictation was prepared with Dragon dictation along with smaller phrase technology. Any transcriptional errors that result from this process are unintentional and may not be corrected upon review.

## 2019-01-12 ENCOUNTER — Other Ambulatory Visit: Payer: Self-pay

## 2019-01-12 ENCOUNTER — Ambulatory Visit (INDEPENDENT_AMBULATORY_CARE_PROVIDER_SITE_OTHER): Payer: Medicare Other | Admitting: Pharmacist

## 2019-01-12 ENCOUNTER — Encounter: Payer: Self-pay | Admitting: Adult Health

## 2019-01-12 ENCOUNTER — Ambulatory Visit (INDEPENDENT_AMBULATORY_CARE_PROVIDER_SITE_OTHER): Payer: Medicare Other | Admitting: Adult Health

## 2019-01-12 VITALS — BP 142/90 | HR 96 | Ht 62.0 in | Wt 200.0 lb

## 2019-01-12 DIAGNOSIS — I480 Paroxysmal atrial fibrillation: Secondary | ICD-10-CM | POA: Diagnosis not present

## 2019-01-12 DIAGNOSIS — G4733 Obstructive sleep apnea (adult) (pediatric): Secondary | ICD-10-CM

## 2019-01-12 DIAGNOSIS — I6521 Occlusion and stenosis of right carotid artery: Secondary | ICD-10-CM | POA: Diagnosis not present

## 2019-01-12 DIAGNOSIS — I10 Essential (primary) hypertension: Secondary | ICD-10-CM

## 2019-01-12 DIAGNOSIS — I5032 Chronic diastolic (congestive) heart failure: Secondary | ICD-10-CM

## 2019-01-12 DIAGNOSIS — Z9989 Dependence on other enabling machines and devices: Secondary | ICD-10-CM

## 2019-01-12 DIAGNOSIS — I4891 Unspecified atrial fibrillation: Secondary | ICD-10-CM | POA: Diagnosis not present

## 2019-01-12 DIAGNOSIS — I6523 Occlusion and stenosis of bilateral carotid arteries: Secondary | ICD-10-CM

## 2019-01-12 LAB — POCT INR: INR: 3.2 — AB (ref 2.0–3.0)

## 2019-01-12 NOTE — Patient Instructions (Signed)
Medication Instructions:  Your physician recommends that you continue on your current medications as directed. Please refer to the Current Medication list given to you today.  *If you need a refill on your cardiac medications before your next appointment, please call your pharmacy*   Follow-Up: At [facility], you and your health needs are our priority.  As part of our continuing mission to provide you with exceptional heart care, we have created designated Provider Care Teams.  These Care Teams include your primary Cardiologist (physician) and Advanced Practice Providers (APPs -  Physician Assistants and Nurse Practitioners) who all work together to provide you with the care you need, when you need it.  Your next appointment:   3 month(s)  The format for your next appointment:   In Person  Provider:   Peter Martinique, MD

## 2019-01-19 ENCOUNTER — Encounter: Payer: Self-pay | Admitting: Podiatry

## 2019-01-19 ENCOUNTER — Ambulatory Visit (INDEPENDENT_AMBULATORY_CARE_PROVIDER_SITE_OTHER): Payer: Medicare Other | Admitting: Podiatry

## 2019-01-19 ENCOUNTER — Other Ambulatory Visit: Payer: Self-pay

## 2019-01-19 DIAGNOSIS — M79675 Pain in left toe(s): Secondary | ICD-10-CM | POA: Diagnosis not present

## 2019-01-19 DIAGNOSIS — Z7901 Long term (current) use of anticoagulants: Secondary | ICD-10-CM

## 2019-01-19 DIAGNOSIS — I6523 Occlusion and stenosis of bilateral carotid arteries: Secondary | ICD-10-CM | POA: Diagnosis not present

## 2019-01-19 DIAGNOSIS — M79674 Pain in right toe(s): Secondary | ICD-10-CM | POA: Diagnosis not present

## 2019-01-19 DIAGNOSIS — L84 Corns and callosities: Secondary | ICD-10-CM | POA: Diagnosis not present

## 2019-01-19 DIAGNOSIS — B351 Tinea unguium: Secondary | ICD-10-CM | POA: Diagnosis not present

## 2019-01-20 DIAGNOSIS — I5033 Acute on chronic diastolic (congestive) heart failure: Secondary | ICD-10-CM | POA: Diagnosis not present

## 2019-01-20 DIAGNOSIS — I4819 Other persistent atrial fibrillation: Secondary | ICD-10-CM | POA: Diagnosis not present

## 2019-01-20 DIAGNOSIS — J9601 Acute respiratory failure with hypoxia: Secondary | ICD-10-CM | POA: Diagnosis not present

## 2019-01-20 DIAGNOSIS — J189 Pneumonia, unspecified organism: Secondary | ICD-10-CM | POA: Diagnosis not present

## 2019-01-20 DIAGNOSIS — I2510 Atherosclerotic heart disease of native coronary artery without angina pectoris: Secondary | ICD-10-CM | POA: Diagnosis not present

## 2019-01-20 DIAGNOSIS — I110 Hypertensive heart disease with heart failure: Secondary | ICD-10-CM | POA: Diagnosis not present

## 2019-01-25 NOTE — Progress Notes (Signed)
Subjective: 84 y.o. returns the office today for follow-up evaluation of a wound to the follow-up of her left foot as well as for toenails that are causing discomfort.  States that she is doing well no swelling or redness or any drainage from the nails or calluses.  She has no other concerns today.   PCP: Deland Pretty, MD  Objective: AAO 3, NAD-presents with husband DP/PT pulses palpable, CRT less than 3 seconds Protective sensation decreased with Derrel Nip monofilament Hyperkeratotic lesion left foot submetatarsal 1.  There is no ongoing ulceration drainage or any signs of infection.  Wound appears to be healed.   Nails are hypertrophic, dystrophic with yellow-brown discoloration.  No edema, erythema, drainage or pus or signs of infection. No other open lesions or preulcerative lesions. No pain with calf compression, swelling, warmth, erythema.  Assessment: Healed ulceration left foot metatarsal 1 with pre-ulcerative callus; symptomatic onychomycosis  Plan: -Treatment options including alternatives, risks, complications were discussed -Debrided the hyperkeratotic tissue today utilizing #312 with scalpel without any complications or bleeding.  -Nails debrided x10 without any complications or bleeding -Continue offloading.   RTC 3 weeks or sooner if needed.   Trula Slade DPM

## 2019-01-26 DIAGNOSIS — I5033 Acute on chronic diastolic (congestive) heart failure: Secondary | ICD-10-CM | POA: Diagnosis not present

## 2019-01-26 DIAGNOSIS — I110 Hypertensive heart disease with heart failure: Secondary | ICD-10-CM | POA: Diagnosis not present

## 2019-01-26 DIAGNOSIS — J9601 Acute respiratory failure with hypoxia: Secondary | ICD-10-CM | POA: Diagnosis not present

## 2019-01-26 DIAGNOSIS — J189 Pneumonia, unspecified organism: Secondary | ICD-10-CM | POA: Diagnosis not present

## 2019-01-26 DIAGNOSIS — I2510 Atherosclerotic heart disease of native coronary artery without angina pectoris: Secondary | ICD-10-CM | POA: Diagnosis not present

## 2019-01-26 DIAGNOSIS — I4819 Other persistent atrial fibrillation: Secondary | ICD-10-CM | POA: Diagnosis not present

## 2019-02-02 DIAGNOSIS — I5033 Acute on chronic diastolic (congestive) heart failure: Secondary | ICD-10-CM | POA: Diagnosis not present

## 2019-02-02 DIAGNOSIS — I4819 Other persistent atrial fibrillation: Secondary | ICD-10-CM | POA: Diagnosis not present

## 2019-02-02 DIAGNOSIS — Z7901 Long term (current) use of anticoagulants: Secondary | ICD-10-CM | POA: Diagnosis not present

## 2019-02-02 DIAGNOSIS — K5090 Crohn's disease, unspecified, without complications: Secondary | ICD-10-CM | POA: Diagnosis not present

## 2019-02-02 DIAGNOSIS — I110 Hypertensive heart disease with heart failure: Secondary | ICD-10-CM | POA: Diagnosis not present

## 2019-02-02 DIAGNOSIS — J9601 Acute respiratory failure with hypoxia: Secondary | ICD-10-CM | POA: Diagnosis not present

## 2019-02-02 DIAGNOSIS — M797 Fibromyalgia: Secondary | ICD-10-CM | POA: Diagnosis not present

## 2019-02-02 DIAGNOSIS — Z86711 Personal history of pulmonary embolism: Secondary | ICD-10-CM | POA: Diagnosis not present

## 2019-02-02 DIAGNOSIS — E785 Hyperlipidemia, unspecified: Secondary | ICD-10-CM | POA: Diagnosis not present

## 2019-02-02 DIAGNOSIS — Z87891 Personal history of nicotine dependence: Secondary | ICD-10-CM | POA: Diagnosis not present

## 2019-02-02 DIAGNOSIS — I739 Peripheral vascular disease, unspecified: Secondary | ICD-10-CM | POA: Diagnosis not present

## 2019-02-02 DIAGNOSIS — I2510 Atherosclerotic heart disease of native coronary artery without angina pectoris: Secondary | ICD-10-CM | POA: Diagnosis not present

## 2019-02-02 DIAGNOSIS — G4733 Obstructive sleep apnea (adult) (pediatric): Secondary | ICD-10-CM | POA: Diagnosis not present

## 2019-02-02 DIAGNOSIS — K219 Gastro-esophageal reflux disease without esophagitis: Secondary | ICD-10-CM | POA: Diagnosis not present

## 2019-02-02 DIAGNOSIS — Z96612 Presence of left artificial shoulder joint: Secondary | ICD-10-CM | POA: Diagnosis not present

## 2019-02-02 DIAGNOSIS — J189 Pneumonia, unspecified organism: Secondary | ICD-10-CM | POA: Diagnosis not present

## 2019-02-08 DIAGNOSIS — I110 Hypertensive heart disease with heart failure: Secondary | ICD-10-CM | POA: Diagnosis not present

## 2019-02-08 DIAGNOSIS — I2510 Atherosclerotic heart disease of native coronary artery without angina pectoris: Secondary | ICD-10-CM | POA: Diagnosis not present

## 2019-02-08 DIAGNOSIS — I5033 Acute on chronic diastolic (congestive) heart failure: Secondary | ICD-10-CM | POA: Diagnosis not present

## 2019-02-08 DIAGNOSIS — J9601 Acute respiratory failure with hypoxia: Secondary | ICD-10-CM | POA: Diagnosis not present

## 2019-02-08 DIAGNOSIS — J189 Pneumonia, unspecified organism: Secondary | ICD-10-CM | POA: Diagnosis not present

## 2019-02-08 DIAGNOSIS — I4819 Other persistent atrial fibrillation: Secondary | ICD-10-CM | POA: Diagnosis not present

## 2019-02-09 ENCOUNTER — Ambulatory Visit: Payer: Medicare Other | Admitting: Podiatry

## 2019-02-10 ENCOUNTER — Other Ambulatory Visit: Payer: Self-pay

## 2019-02-10 ENCOUNTER — Ambulatory Visit (INDEPENDENT_AMBULATORY_CARE_PROVIDER_SITE_OTHER): Payer: Medicare Other | Admitting: Pharmacist

## 2019-02-10 DIAGNOSIS — I4891 Unspecified atrial fibrillation: Secondary | ICD-10-CM

## 2019-02-10 LAB — POCT INR: INR: 3.4 — AB (ref 2.0–3.0)

## 2019-02-16 ENCOUNTER — Ambulatory Visit: Payer: Medicare Other | Admitting: Cardiology

## 2019-02-25 ENCOUNTER — Other Ambulatory Visit: Payer: Self-pay

## 2019-02-25 ENCOUNTER — Ambulatory Visit (INDEPENDENT_AMBULATORY_CARE_PROVIDER_SITE_OTHER): Payer: Medicare Other | Admitting: Podiatry

## 2019-02-25 DIAGNOSIS — L84 Corns and callosities: Secondary | ICD-10-CM | POA: Diagnosis not present

## 2019-02-25 DIAGNOSIS — Z7901 Long term (current) use of anticoagulants: Secondary | ICD-10-CM

## 2019-03-03 DIAGNOSIS — J9601 Acute respiratory failure with hypoxia: Secondary | ICD-10-CM | POA: Diagnosis not present

## 2019-03-03 DIAGNOSIS — I2510 Atherosclerotic heart disease of native coronary artery without angina pectoris: Secondary | ICD-10-CM | POA: Diagnosis not present

## 2019-03-03 DIAGNOSIS — R790 Abnormal level of blood mineral: Secondary | ICD-10-CM | POA: Diagnosis not present

## 2019-03-03 DIAGNOSIS — I4819 Other persistent atrial fibrillation: Secondary | ICD-10-CM | POA: Diagnosis not present

## 2019-03-03 DIAGNOSIS — E559 Vitamin D deficiency, unspecified: Secondary | ICD-10-CM | POA: Diagnosis not present

## 2019-03-03 DIAGNOSIS — I110 Hypertensive heart disease with heart failure: Secondary | ICD-10-CM | POA: Diagnosis not present

## 2019-03-03 DIAGNOSIS — J189 Pneumonia, unspecified organism: Secondary | ICD-10-CM | POA: Diagnosis not present

## 2019-03-03 DIAGNOSIS — I10 Essential (primary) hypertension: Secondary | ICD-10-CM | POA: Diagnosis not present

## 2019-03-03 DIAGNOSIS — I5033 Acute on chronic diastolic (congestive) heart failure: Secondary | ICD-10-CM | POA: Diagnosis not present

## 2019-03-04 NOTE — Progress Notes (Signed)
Subjective: 85 y.o. returns the office today for follow-up evaluation of a callus to the follow-up of her left foot. States that she is doing well no swelling or redness or any drainage from the nails or calluses.  She has no other concerns today.   PCP: Deland Pretty, MD  Objective: AAO 3, NAD-presents with husband DP/PT pulses palpable, CRT less than 3 seconds Protective sensation decreased with Derrel Nip monofilament Hyperkeratotic lesion left foot submetatarsal 1.  There is no ongoing ulceration drainage or any signs of infection.  There is no edema, erythema or any clinical signs of infection.  No other open lesions or preulcerative lesions. No pain with calf compression, swelling, warmth, erythema.  Assessment: Healed ulceration left foot metatarsal 1 with pre-ulcerative callus; symptomatic onychomycosis  Plan: -Treatment options including alternatives, risks, complications were discussed -Debrided the hyperkeratotic tissue today utilizing #312 with scalpel without any complications or bleeding.  -Continue offloading/moisturizer   RTC 3 weeks or sooner if needed.   Trula Slade DPM

## 2019-03-05 ENCOUNTER — Other Ambulatory Visit: Payer: Self-pay

## 2019-03-05 ENCOUNTER — Ambulatory Visit (INDEPENDENT_AMBULATORY_CARE_PROVIDER_SITE_OTHER): Payer: Medicare Other | Admitting: Pharmacist Clinician (PhC)/ Clinical Pharmacy Specialist

## 2019-03-05 DIAGNOSIS — I4891 Unspecified atrial fibrillation: Secondary | ICD-10-CM | POA: Diagnosis not present

## 2019-03-05 DIAGNOSIS — Z7901 Long term (current) use of anticoagulants: Secondary | ICD-10-CM | POA: Diagnosis not present

## 2019-03-05 LAB — POCT INR: INR: 2.5 (ref 2.0–3.0)

## 2019-03-25 ENCOUNTER — Ambulatory Visit: Payer: Medicare Other | Admitting: Podiatry

## 2019-04-08 DIAGNOSIS — Z79891 Long term (current) use of opiate analgesic: Secondary | ICD-10-CM | POA: Diagnosis not present

## 2019-04-08 DIAGNOSIS — Z86711 Personal history of pulmonary embolism: Secondary | ICD-10-CM | POA: Diagnosis not present

## 2019-04-08 DIAGNOSIS — M5136 Other intervertebral disc degeneration, lumbar region: Secondary | ICD-10-CM | POA: Diagnosis not present

## 2019-04-08 DIAGNOSIS — M545 Low back pain: Secondary | ICD-10-CM | POA: Diagnosis not present

## 2019-04-08 DIAGNOSIS — D6869 Other thrombophilia: Secondary | ICD-10-CM | POA: Diagnosis not present

## 2019-04-08 DIAGNOSIS — G894 Chronic pain syndrome: Secondary | ICD-10-CM | POA: Diagnosis not present

## 2019-04-08 DIAGNOSIS — M961 Postlaminectomy syndrome, not elsewhere classified: Secondary | ICD-10-CM | POA: Diagnosis not present

## 2019-04-13 DIAGNOSIS — H01026 Squamous blepharitis left eye, unspecified eyelid: Secondary | ICD-10-CM | POA: Diagnosis not present

## 2019-04-13 DIAGNOSIS — H401112 Primary open-angle glaucoma, right eye, moderate stage: Secondary | ICD-10-CM | POA: Diagnosis not present

## 2019-04-13 DIAGNOSIS — H40052 Ocular hypertension, left eye: Secondary | ICD-10-CM | POA: Diagnosis not present

## 2019-04-13 DIAGNOSIS — H01023 Squamous blepharitis right eye, unspecified eyelid: Secondary | ICD-10-CM | POA: Diagnosis not present

## 2019-04-13 DIAGNOSIS — J189 Pneumonia, unspecified organism: Secondary | ICD-10-CM | POA: Diagnosis not present

## 2019-04-13 DIAGNOSIS — Z961 Presence of intraocular lens: Secondary | ICD-10-CM | POA: Diagnosis not present

## 2019-04-13 DIAGNOSIS — I5033 Acute on chronic diastolic (congestive) heart failure: Secondary | ICD-10-CM | POA: Diagnosis not present

## 2019-04-13 DIAGNOSIS — S0511XS Contusion of eyeball and orbital tissues, right eye, sequela: Secondary | ICD-10-CM | POA: Diagnosis not present

## 2019-04-13 DIAGNOSIS — H33052 Total retinal detachment, left eye: Secondary | ICD-10-CM | POA: Diagnosis not present

## 2019-04-13 DIAGNOSIS — J9601 Acute respiratory failure with hypoxia: Secondary | ICD-10-CM | POA: Diagnosis not present

## 2019-04-13 DIAGNOSIS — I110 Hypertensive heart disease with heart failure: Secondary | ICD-10-CM | POA: Diagnosis not present

## 2019-04-15 ENCOUNTER — Ambulatory Visit (INDEPENDENT_AMBULATORY_CARE_PROVIDER_SITE_OTHER): Payer: Medicare Other | Admitting: Podiatry

## 2019-04-15 ENCOUNTER — Encounter: Payer: Self-pay | Admitting: Podiatry

## 2019-04-15 ENCOUNTER — Other Ambulatory Visit: Payer: Self-pay

## 2019-04-15 VITALS — Temp 97.4°F

## 2019-04-15 DIAGNOSIS — Z7901 Long term (current) use of anticoagulants: Secondary | ICD-10-CM | POA: Diagnosis not present

## 2019-04-15 DIAGNOSIS — M79674 Pain in right toe(s): Secondary | ICD-10-CM

## 2019-04-15 DIAGNOSIS — B351 Tinea unguium: Secondary | ICD-10-CM

## 2019-04-15 DIAGNOSIS — L84 Corns and callosities: Secondary | ICD-10-CM

## 2019-04-15 DIAGNOSIS — M79675 Pain in left toe(s): Secondary | ICD-10-CM

## 2019-04-16 NOTE — Progress Notes (Signed)
Cardiology Office Note   Date:  04/19/2019   ID:  [name], DOB [birth year], MRN [id number]  PCP:  Deland Pretty, MD  Cardiologist: Dr. Martinique CC: TOC Follow Up    History of Present Illness: [name] is a 85 y.o. female who is seen for follow up diastolic CHF.   [name] has a history of nonobstructive CAD, hypertension, hyperlipidemia, carotid artery disease followed by Dr. Curt Jews, with other history to include OSA on CPAP, Crohn's disease, and GERD.  The patient was admitted in November 2020 and was diagnosed with right lower lobe pneumonia, she was also found to be in atrial fib with RVR, and felt to be volume overloaded driven by underlying infection.  She was diuresed with IV Lasix.  She remained hypertensive despite Bystolic and amlodipine. She was treated with IV antibiotics for pneumonia.  After IV diuresis she was to transition to Lasix 20 mg p.o. daily x7 days with potassium 20 mEq daily and then Lasix 10 mg daily going forward thereafter.  She was also to continue her Bystolic.  For atrial fib the patient was to continue on Coumadin for stroke prophylaxis, rate was controlled on Bystolic.   She comes today and is seen with her husband. She is doing well from a cardiac standpoint. She is tired a lot. Not sleeping well. Only about 3 hours a night.BP 148/82 at home piror to meds today.  She gets SOB easily with activity. Sats at home 90-92% and goes down to 85% with exertion.  She denies bleeding or bruising on coumadin.   Past Medical History:  Diagnosis Date  . Atrial fibrillation ([place])   . Carotid artery occlusion   . Chronic diastolic CHF (congestive heart failure) (HCC)    Hypertensive heart disease 02-12-14  . Crohn's disease ([place])   . Detached retina   . Fibromyalgia   . GERD (gastroesophageal reflux disease)   . Gout   . H/O hiatal hernia   . High triglycerides   . History of stomach ulcers 1950's  . Hypertension   . Long term current use of  anticoagulant   . Obstructive sleep apnea on CPAP   . Osteoarthritis    PAIN AND OA LEF T HIP AND BOTH SHOULDERS ARE BONE ON BONE AND PAINFUL  . Peripheral vascular disease (HCC)    KNOWN RIGHT INTERNAL CAROTID ARTERY OCCLUSION (NO STROKE)  --40 TO 59% STENOSIS LEFT ICA-FOLLOWED BY DR. EARLY WITH DOPPLER STUDY EVERY 6 MONTHS  . Pulmonary embolism ([place]) 2011   a. Hx of PE in 02/2009 after R hip surgery, venous dopplers negative, long-term Coumadin.  [name] Kitchen PVC (premature ventricular contraction)    PT STATES HX OF PVC'S ON EKG  . Recurrent upper respiratory infection (URI)    BRONCHITIS FEB 2013--SLIGHT COUGH NON-PRODUCTIVE NOW  . Recurrent UTI (urinary tract infection)    "on daily medicine" (05/14/2012)  . Tinnitus   . Vertigo     Past Surgical History:  Procedure Laterality Date  . APPENDECTOMY  1950's  . BACK SURGERY  10/29/06   Central and foraminal decompression L3-L4, L4-L5, and L5-S1 with inspection of L4-L5 and L5-S1 disc on the right  . CARDIAC CATHETERIZATION  1970's   "maybe 2" (05/14/2012)  . CARDIAC CATHETERIZATION    . CATARACT EXTRACTION W/ INTRAOCULAR LENS IMPLANT Right 2009  . CHOLECYSTECTOMY  ~ 1988  . DECOMPRESSIVE LUMBAR LAMINECTOMY LEVEL 3  ~ 2002  . DILATION AND CURETTAGE OF UTERUS     "several;  from miscarriages" (05/14/2012)  . EXCISION MORTON'S NEUROMA Right 05/20/00   "foot" (05/14/2012)  . EYE SURGERY Left 2014   Detached retina  . EYE SURGERY Left August 23, 2013   Cataract  . FEMUR FRACTURE SURGERY Right 02/21/09   Open reduction internal fixation of right periprosthetic  femur fracture utilizing Zimmer cables times fiv  . FRACTURE SURGERY  2011   Right Femur Fx  . HAMMER TOE SURGERY Left 10/25/08   "toe next to big to" (05/14/2012)  . JOINT REPLACEMENT  2009   Right Hip replacement  . JOINT REPLACEMENT  2012   Right knee replacement  . JOINT REPLACEMENT  06/17/11   Left Hip replacement  . KNEE ARTHROSCOPY Right 07/26/05  . REPLACEMENT TOTAL KNEE Right 02/19/2010    . SPINE SURGERY    . [place]?  . TOTAL HIP ARTHROPLASTY Right 12/08/02   Osteonics total hip replacement  . TOTAL HIP ARTHROPLASTY  06/17/2011   Procedure: TOTAL HIP ARTHROPLASTY;  Surgeon: Gearlean Alf, MD;  Location: [facility];  Service: Orthopedics;  Laterality: Left;  . TOTAL SHOULDER ARTHROPLASTY Left 05/14/2012  . TOTAL SHOULDER ARTHROPLASTY Left 05/14/2012   Procedure: TOTAL SHOULDER ARTHROPLASTY;  Surgeon: Marin Shutter, MD;  Location: [place];  Service: Orthopedics;  Laterality: Left;  [name] Kitchen VAGINAL HYSTERECTOMY  1971     Current Outpatient Medications  Medication Sig Dispense Refill  . allopurinol (ZYLOPRIM) 300 MG tablet Take 300 mg by mouth daily.    [name] Kitchen amLODipine (NORVASC) 5 MG tablet Take 5 mg by mouth daily.    . brimonidine (ALPHAGAN) 0.2 % ophthalmic solution 1 drop 2 (two) times daily.     . cefpodoxime (VANTIN) 200 MG tablet     . cetirizine (ZYRTEC) 10 MG tablet Take 10 mg by mouth daily.    . cimetidine (TAGAMET) 200 MG tablet Take 200 mg by mouth 2 (two) times daily.    [name] Kitchen CRANBERRY CONCENTRATE PO Take 1 tablet by mouth daily.    . dorzolamide-timolol (COSOPT) 22.3-6.8 MG/ML ophthalmic solution Place 1 drop into both eyes 2 (two) times daily.     [name] Kitchen FLUZONE HIGH-DOSE QUADRIVALENT 0.7 ML SUSY ADM 0.7ML IM UTD    . furosemide (LASIX) 20 MG tablet Take 1 tab (62m)  daily for a week, then continue 1/2 tab (133m daily 90 tablet 1  . HYDROcodone-acetaminophen (NORCO/VICODIN) 5-325 MG tablet Take 0.5-1 tablets by mouth 3 (three) times daily as needed for pain.     . [name] Kitchenpratropium (ATROVENT) 0.06 % nasal spray Place 1 spray into both nostrils daily as needed (allergies).     . [name] KitchenUMIGAN 0.01 % SOLN Place 1 drop into both eyes at bedtime.    . mupirocin ointment (BACTROBAN) 2 % Apply 1 application topically 2 (two) times daily. 30 g 2  . nebivolol (BYSTOLIC) 10 MG tablet Take 1 tablet (10 mg total) by mouth at bedtime. 90 tablet 3  . potassium chloride  (KLOR-CON) 10 MEQ tablet     . silver sulfADIAZINE (SILVADENE) 1 % cream Apply 1 application topically daily. 50 g 0  . triamcinolone (NASACORT ALLERGY 24HR) 55 MCG/ACT AERO nasal inhaler Place 2 sprays into the nose daily as needed (allergies).     . warfarin (COUMADIN) 2.5 MG tablet TAKE 1 TO 1&1/2 TABLETS DAILY AS DIRECTED BY COUMADIN CLINIC. 120 tablet 0  . potassium chloride (KLOR-CON) 10 MEQ tablet Take 2 tablets (20 mEq total) by mouth daily. 60 tablet 1   No current  facility-administered medications for this visit.    Allergies:   Codeine, Cymbalta [duloxetine hcl], Diltiazem cd [diltiazem hcl er beads], Duloxetine, Metoprolol, Nortriptyline, Penicillins, Statins, Tetanus toxoid adsorbed, Tetanus toxoids, Ciprofloxacin, Lyrica [pregabalin], and Penicillin g    Social History:  The patient  reports that she quit smoking about 33 years ago. Her smoking use included cigarettes. She has a 7.50 pack-year smoking history. She has never used smokeless tobacco. She reports current alcohol use. She reports that she does not use drugs.   Family History:  The patient's family history includes AAA (abdominal aortic aneurysm) in her mother; Aneurysm in her mother; Diabetes in her paternal grandfather and son; Heart attack in her brother; Heart attack (age of onset: 57) in her father; Heart disease in her brother, father, and mother; Hyperlipidemia in her brother; Hypertension in her brother, brother, father, and mother; Peripheral vascular disease in her father; Stroke in her mother.    ROS: All other systems are reviewed and negative. Unless otherwise mentioned in H&P    PHYSICAL EXAM: VS:  BP (!) 160/84 (BP Location: Left Arm, Cuff Size: Normal)   Pulse 100   Temp (!) 97.3 F (36.3 C)   Resp (!) 21   Ht 5' 2"  (1.575 m)   Wt 202 lb (91.6 kg)   SpO2 90%   BMI 36.95 kg/m  , BMI Body mass index is 36.95 kg/m. GEN: Well nourished, obese, in no acute distress HEENT: normal Neck: no JVD,  carotid bruits, or masses Cardiac: IRRR; no murmurs, rubs, or gallops,no edema  Respiratory:  Clear to auscultation bilaterally, normal work of breathing GI: soft, nontender, nondistended, + BS MS: no deformity or atrophy Skin: warm and dry, no rash Neuro:  Strength and sensation are intact Psych: euthymic mood, full affect   EKG: Not done today.  Recent Labs: 09/08/2018: Magnesium 1.3 12/27/2018: B Natriuretic Peptide 300.3 12/28/2018: ALT 12 12/31/2018: BUN 23; Creatinine, Ser 0.82; Hemoglobin 11.3; Platelets 150; Potassium 4.2; Sodium 141    Lipid Panel No results found for: CHOL, TRIG, HDL, CHOLHDL, VLDL, LDLCALC, LDLDIRECT    Wt Readings from Last 3 Encounters:  04/19/19 202 lb (91.6 kg)  01/12/19 200 lb (90.7 kg)  12/31/18 201 lb 15.1 oz (91.6 kg)      Other studies Reviewed: Echocardiogram Aug 12, 2018: 1. The left ventricle has normal systolic function, with an ejection fraction of 55-60%. The cavity size was normal. Left ventricular diastolic function could not be evaluated secondary to atrial fibrillation. 2. The right ventricle has normal systolic function. The cavity was normal. There is no increase in right ventricular wall thickness. Right ventricular systolic pressure is normal with an estimated pressure of 27.9 mmHg. 3. Left atrial size was severely dilated. 4. Right atrial size was moderately dilated. 5. Trivial pericardial effusion is present. 6. The aortic root and ascending aorta are normal in size and structure. 7. The inferior vena cava was dilated in size with >50% respiratory variability.   ASSESSMENT AND PLAN:  1. Atrial fibrillation: Heart rate is well controlled today. Continue Bystolic for rate control. She will be seen by the pharmacist today for coumadin dosing with INR.   2. Chronic Diastolic CHF:  Volume status looks good. Weight has been stable. Continue lasix 10 mg daily.   3. Hypertension: BP is elevated today post ambulation but  generally controlled at home.   4. OSA:  Continues on CPAP at HS and also wears 02 via [place] during the day via O2 concentrator. Followed by Dr  Pharr.  5.Carotid Artery Disease:  Right carotid artery occlusion. Followed by Dr.Todd Early.    Current medicines are reviewed at length with the patient today.    Labs/ tests ordered today include: None   Follow up 6 months.  Keshawn Fiorito Martinique MD, [facility]     04/19/2019 1:48 PM    [place] [address] Office [phone] Fax [phone]

## 2019-04-19 ENCOUNTER — Encounter: Payer: Self-pay | Admitting: Cardiology

## 2019-04-19 ENCOUNTER — Ambulatory Visit (INDEPENDENT_AMBULATORY_CARE_PROVIDER_SITE_OTHER): Payer: Medicare Other | Admitting: Cardiology

## 2019-04-19 ENCOUNTER — Ambulatory Visit (INDEPENDENT_AMBULATORY_CARE_PROVIDER_SITE_OTHER): Payer: Medicare Other | Admitting: Pharmacist Clinician (PhC)/ Clinical Pharmacy Specialist

## 2019-04-19 ENCOUNTER — Other Ambulatory Visit: Payer: Self-pay

## 2019-04-19 VITALS — BP 160/84 | HR 100 | Temp 97.3°F | Resp 21 | Ht 62.0 in | Wt 202.0 lb

## 2019-04-19 DIAGNOSIS — I10 Essential (primary) hypertension: Secondary | ICD-10-CM

## 2019-04-19 DIAGNOSIS — I480 Paroxysmal atrial fibrillation: Secondary | ICD-10-CM | POA: Diagnosis not present

## 2019-04-19 DIAGNOSIS — Z9989 Dependence on other enabling machines and devices: Secondary | ICD-10-CM

## 2019-04-19 DIAGNOSIS — G4733 Obstructive sleep apnea (adult) (pediatric): Secondary | ICD-10-CM | POA: Diagnosis not present

## 2019-04-19 DIAGNOSIS — I4891 Unspecified atrial fibrillation: Secondary | ICD-10-CM | POA: Diagnosis not present

## 2019-04-19 DIAGNOSIS — I5032 Chronic diastolic (congestive) heart failure: Secondary | ICD-10-CM | POA: Diagnosis not present

## 2019-04-19 LAB — POCT INR: INR: 2.1 (ref 2.0–3.0)

## 2019-04-19 NOTE — Patient Instructions (Signed)
Continue with 1 tablet daily except 1.5 tablets each Mondays and Friday.  Repeat INR in 6 week. Call with questions [phone]

## 2019-04-26 NOTE — Progress Notes (Signed)
Subjective: 85 y.o. returns the office today for follow-up evaluation of a callus to the follow-up of her left foot as well as for thick elongated toenails that she cannot trim her self. States that she is doing well no swelling or redness or any drainage from the nails or calluses.  She has no other concerns today.   PCP: Deland Pretty, MD  Objective: AAO 3, NAD-presents with husband DP/PT pulses palpable, CRT less than 3 seconds Protective sensation decreased with Derrel Nip monofilament Hyperkeratotic lesion left foot submetatarsal 1.  There is no ongoing ulceration drainage or any signs of infection.  There is no edema, erythema or any clinical signs of infection.  No other open lesions or preulcerative lesions. Nails are hypertrophic, dystrophic, brittle, discolored, elongated 10. No surrounding redness or drainage. Tenderness nails 1-5 bilaterally.  No pain with calf compression, swelling, warmth, erythema.  Assessment: Left foot metatarsal 1 with pre-ulcerative callus; symptomatic onychomycosis  Plan: -Treatment options including alternatives, risks, complications were discussed -Debrided the hyperkeratotic tissue today utilizing #312 with scalpel without any complications or bleeding. -Debrided nails x10 without any complications or bleeding -Continue offloading/moisturizer   RTC 3 weeks or sooner if needed.   Trula Slade DPM

## 2019-05-04 ENCOUNTER — Ambulatory Visit: Payer: Medicare Other | Admitting: Podiatry

## 2019-05-08 ENCOUNTER — Other Ambulatory Visit: Payer: Self-pay | Admitting: Cardiology

## 2019-05-17 ENCOUNTER — Other Ambulatory Visit: Payer: Self-pay

## 2019-05-17 ENCOUNTER — Encounter: Payer: Self-pay | Admitting: Podiatry

## 2019-05-17 ENCOUNTER — Ambulatory Visit (INDEPENDENT_AMBULATORY_CARE_PROVIDER_SITE_OTHER): Payer: Medicare Other | Admitting: Podiatry

## 2019-05-17 VITALS — Temp 98.3°F

## 2019-05-17 DIAGNOSIS — M79674 Pain in right toe(s): Secondary | ICD-10-CM

## 2019-05-17 DIAGNOSIS — B351 Tinea unguium: Secondary | ICD-10-CM | POA: Diagnosis not present

## 2019-05-17 DIAGNOSIS — L84 Corns and callosities: Secondary | ICD-10-CM | POA: Diagnosis not present

## 2019-05-17 DIAGNOSIS — M79675 Pain in left toe(s): Secondary | ICD-10-CM | POA: Diagnosis not present

## 2019-05-17 DIAGNOSIS — Z7901 Long term (current) use of anticoagulants: Secondary | ICD-10-CM

## 2019-05-25 NOTE — Progress Notes (Signed)
Subjective: 85 y.o. returns the office today for follow-up evaluation of a callus to the follow-up of her left foot as well as her both her big toenails which are cutting into the second toe.  Denies any open sores and denies redness or drainage or any swelling.    PCP: Deland Pretty, MD  Objective: AAO 3, NAD-presents with husband DP/PT pulses palpable, CRT less than 3 seconds Protective sensation decreased with Derrel Nip monofilament Hyperkeratotic lesion left foot submetatarsal 1.  Upon debridement there is no underlying ulceration, drainage or signs of infection.  The nails of bilateral hallux are mildly elongated and hypertrophic, dystrophic with yellow discoloration. No pain with calf compression, swelling, warmth, erythema.  Assessment: Left foot metatarsal 1 with pre-ulcerative callus; symptomatic onychomycosis  Plan: -Treatment options including alternatives, risks, complications were discussed -Debrided the hyperkeratotic tissue today utilizing #312 with scalpel without any complications or bleeding.  Continue offloading and moisturizer daily. -Debrided nails x2 without any complications or bleeding -Continue offloading/moisturizer   RTC 3 weeks or sooner if needed.   Trula Slade DPM

## 2019-05-31 ENCOUNTER — Other Ambulatory Visit: Payer: Self-pay

## 2019-05-31 ENCOUNTER — Ambulatory Visit (INDEPENDENT_AMBULATORY_CARE_PROVIDER_SITE_OTHER): Payer: Medicare Other | Admitting: Pharmacist

## 2019-05-31 DIAGNOSIS — I4891 Unspecified atrial fibrillation: Secondary | ICD-10-CM | POA: Diagnosis not present

## 2019-05-31 LAB — POCT INR: INR: 2.1 (ref 2.0–3.0)

## 2019-06-08 ENCOUNTER — Ambulatory Visit: Payer: PRIVATE HEALTH INSURANCE | Admitting: Podiatry

## 2019-06-10 DIAGNOSIS — I10 Essential (primary) hypertension: Secondary | ICD-10-CM | POA: Diagnosis not present

## 2019-06-14 ENCOUNTER — Ambulatory Visit (INDEPENDENT_AMBULATORY_CARE_PROVIDER_SITE_OTHER): Payer: Medicare Other | Admitting: Podiatry

## 2019-06-14 ENCOUNTER — Encounter: Payer: Self-pay | Admitting: Podiatry

## 2019-06-14 ENCOUNTER — Other Ambulatory Visit: Payer: Self-pay

## 2019-06-14 DIAGNOSIS — Z7901 Long term (current) use of anticoagulants: Secondary | ICD-10-CM

## 2019-06-14 DIAGNOSIS — L84 Corns and callosities: Secondary | ICD-10-CM

## 2019-06-14 NOTE — Progress Notes (Signed)
Subjective: 85 y.o. returns the office today for follow-up evaluation of a callus to the follow-up of her left foot. Denies any open sores and denies redness or drainage or any swelling.    PCP: Deland Pretty, MD  Objective: AAO 3, NAD-presents with husband DP/PT pulses palpable, CRT less than 3 seconds Protective sensation decreased with Derrel Nip monofilament Hyperkeratotic lesion left foot submetatarsal 1.  Upon debridement there is no underlying ulceration, drainage or signs of infection.   No pain with calf compression, swelling, warmth, erythema.  Assessment: Left foot metatarsal 1 with pre-ulcerative callus  Plan: -Treatment options including alternatives, risks, complications were discussed -Debrided the hyperkeratotic tissue today utilizing #312 with scalpel without any complications or bleeding.  Continue offloading and moisturizer daily. -Continue offloading/moisturizer   RTC 3 weeks or sooner if needed.   Trula Slade DPM

## 2019-06-16 DIAGNOSIS — F329 Major depressive disorder, single episode, unspecified: Secondary | ICD-10-CM | POA: Diagnosis not present

## 2019-06-16 DIAGNOSIS — E876 Hypokalemia: Secondary | ICD-10-CM | POA: Diagnosis not present

## 2019-06-16 DIAGNOSIS — R252 Cramp and spasm: Secondary | ICD-10-CM | POA: Diagnosis not present

## 2019-06-18 DIAGNOSIS — E612 Magnesium deficiency: Secondary | ICD-10-CM | POA: Diagnosis not present

## 2019-06-22 DIAGNOSIS — R5383 Other fatigue: Secondary | ICD-10-CM | POA: Diagnosis not present

## 2019-06-22 DIAGNOSIS — E876 Hypokalemia: Secondary | ICD-10-CM | POA: Diagnosis not present

## 2019-06-22 DIAGNOSIS — R197 Diarrhea, unspecified: Secondary | ICD-10-CM | POA: Diagnosis not present

## 2019-06-23 DIAGNOSIS — R197 Diarrhea, unspecified: Secondary | ICD-10-CM | POA: Diagnosis not present

## 2019-06-24 DIAGNOSIS — R197 Diarrhea, unspecified: Secondary | ICD-10-CM | POA: Diagnosis not present

## 2019-07-06 ENCOUNTER — Ambulatory Visit (INDEPENDENT_AMBULATORY_CARE_PROVIDER_SITE_OTHER): Payer: Medicare Other | Admitting: Podiatry

## 2019-07-06 ENCOUNTER — Encounter: Payer: Self-pay | Admitting: Podiatry

## 2019-07-06 ENCOUNTER — Other Ambulatory Visit: Payer: Self-pay

## 2019-07-06 DIAGNOSIS — Z7901 Long term (current) use of anticoagulants: Secondary | ICD-10-CM

## 2019-07-06 DIAGNOSIS — B351 Tinea unguium: Secondary | ICD-10-CM | POA: Diagnosis not present

## 2019-07-06 DIAGNOSIS — L84 Corns and callosities: Secondary | ICD-10-CM | POA: Diagnosis not present

## 2019-07-06 DIAGNOSIS — M79675 Pain in left toe(s): Secondary | ICD-10-CM | POA: Diagnosis not present

## 2019-07-06 DIAGNOSIS — M79674 Pain in right toe(s): Secondary | ICD-10-CM | POA: Diagnosis not present

## 2019-07-06 NOTE — Progress Notes (Signed)
Subjective: 85 y.o. returns the office today for follow-up evaluation of a callus to the follow-up of her left foot as well as for thick elongated toenails that she cannot trim her self. States that she is doing well no swelling or redness or any drainage from the nails or calluses.  She has no other concerns today.   PCP: Deland Pretty, MD  Objective: AAO 3, NAD-presents with husband DP/PT pulses palpable, CRT less than 3 seconds Protective sensation decreased with Derrel Nip monofilament Hyperkeratotic lesion left foot submetatarsal 1.  There is no ongoing ulceration drainage or any signs of infection.  There is no edema, erythema or any clinical signs of infection.  No other open lesions or preulcerative lesions. Nails are hypertrophic, dystrophic, brittle, discolored, elongated 10. No surrounding redness or drainage. Tenderness nails 1-5 bilaterally.  Mild bruising to the arch of the foot. No pain. No pain to the ankle. No injury No pain with calf compression, swelling, warmth, erythema.  Assessment: Left foot metatarsal 1 with pre-ulcerative callus; symptomatic onychomycosis  Plan: -Treatment options including alternatives, risks, complications were discussed -Debrided the hyperkeratotic tissue today utilizing #312 with scalpel without any complications or bleeding. -Debrided nails x10 without any complications or bleeding -Monitor the burising on the left foot. If it worsens or has any pain to let me know. She states it started after increased swelling from not taking her fluid pills or wearing compression stockings.  -Continue offloading/moisturizer   RTC 3 weeks or sooner if needed.   Trula Slade DPM

## 2019-07-14 ENCOUNTER — Ambulatory Visit (INDEPENDENT_AMBULATORY_CARE_PROVIDER_SITE_OTHER): Payer: Medicare Other | Admitting: Pharmacist

## 2019-07-14 ENCOUNTER — Other Ambulatory Visit: Payer: Self-pay

## 2019-07-14 DIAGNOSIS — I4891 Unspecified atrial fibrillation: Secondary | ICD-10-CM | POA: Diagnosis not present

## 2019-07-14 LAB — POCT INR: INR: 1.8 — AB (ref 2.0–3.0)

## 2019-07-19 ENCOUNTER — Emergency Department (HOSPITAL_COMMUNITY): Payer: Medicare Other

## 2019-07-19 ENCOUNTER — Encounter (HOSPITAL_COMMUNITY): Payer: Self-pay

## 2019-07-19 ENCOUNTER — Other Ambulatory Visit: Payer: Self-pay

## 2019-07-19 ENCOUNTER — Inpatient Hospital Stay (HOSPITAL_COMMUNITY)
Admission: EM | Admit: 2019-07-19 | Discharge: 2019-07-23 | DRG: 483 | Disposition: A | Discharge status: Skilled Nursing Facility | Payer: Medicare Other | Attending: Orthopedic Surgery | Admitting: Orthopedic Surgery

## 2019-07-19 DIAGNOSIS — I739 Peripheral vascular disease, unspecified: Secondary | ICD-10-CM | POA: Diagnosis present

## 2019-07-19 DIAGNOSIS — W19XXXA Unspecified fall, initial encounter: Secondary | ICD-10-CM | POA: Diagnosis not present

## 2019-07-19 DIAGNOSIS — K5090 Crohn's disease, unspecified, without complications: Secondary | ICD-10-CM | POA: Diagnosis not present

## 2019-07-19 DIAGNOSIS — M109 Gout, unspecified: Secondary | ICD-10-CM | POA: Diagnosis present

## 2019-07-19 DIAGNOSIS — Z471 Aftercare following joint replacement surgery: Secondary | ICD-10-CM | POA: Diagnosis not present

## 2019-07-19 DIAGNOSIS — S42201A Unspecified fracture of upper end of right humerus, initial encounter for closed fracture: Secondary | ICD-10-CM | POA: Diagnosis not present

## 2019-07-19 DIAGNOSIS — Z96612 Presence of left artificial shoulder joint: Secondary | ICD-10-CM | POA: Diagnosis present

## 2019-07-19 DIAGNOSIS — Z96611 Presence of right artificial shoulder joint: Secondary | ICD-10-CM

## 2019-07-19 DIAGNOSIS — G4733 Obstructive sleep apnea (adult) (pediatric): Secondary | ICD-10-CM | POA: Diagnosis not present

## 2019-07-19 DIAGNOSIS — M5136 Other intervertebral disc degeneration, lumbar region: Secondary | ICD-10-CM | POA: Diagnosis not present

## 2019-07-19 DIAGNOSIS — Z87891 Personal history of nicotine dependence: Secondary | ICD-10-CM | POA: Diagnosis not present

## 2019-07-19 DIAGNOSIS — Z9841 Cataract extraction status, right eye: Secondary | ICD-10-CM | POA: Diagnosis not present

## 2019-07-19 DIAGNOSIS — Z96643 Presence of artificial hip joint, bilateral: Secondary | ICD-10-CM | POA: Diagnosis present

## 2019-07-19 DIAGNOSIS — Z888 Allergy status to other drugs, medicaments and biological substances status: Secondary | ICD-10-CM

## 2019-07-19 DIAGNOSIS — R52 Pain, unspecified: Secondary | ICD-10-CM | POA: Diagnosis not present

## 2019-07-19 DIAGNOSIS — S42221A 2-part displaced fracture of surgical neck of right humerus, initial encounter for closed fracture: Secondary | ICD-10-CM | POA: Diagnosis present

## 2019-07-19 DIAGNOSIS — I10 Essential (primary) hypertension: Secondary | ICD-10-CM | POA: Diagnosis not present

## 2019-07-19 DIAGNOSIS — W010XXA Fall on same level from slipping, tripping and stumbling without subsequent striking against object, initial encounter: Secondary | ICD-10-CM | POA: Diagnosis present

## 2019-07-19 DIAGNOSIS — G8918 Other acute postprocedural pain: Secondary | ICD-10-CM | POA: Diagnosis not present

## 2019-07-19 DIAGNOSIS — Z79891 Long term (current) use of opiate analgesic: Secondary | ICD-10-CM

## 2019-07-19 DIAGNOSIS — I5032 Chronic diastolic (congestive) heart failure: Secondary | ICD-10-CM | POA: Diagnosis not present

## 2019-07-19 DIAGNOSIS — Z887 Allergy status to serum and vaccine status: Secondary | ICD-10-CM

## 2019-07-19 DIAGNOSIS — G8929 Other chronic pain: Secondary | ICD-10-CM | POA: Diagnosis not present

## 2019-07-19 DIAGNOSIS — Z7901 Long term (current) use of anticoagulants: Secondary | ICD-10-CM

## 2019-07-19 DIAGNOSIS — Z9049 Acquired absence of other specified parts of digestive tract: Secondary | ICD-10-CM | POA: Diagnosis not present

## 2019-07-19 DIAGNOSIS — D649 Anemia, unspecified: Secondary | ICD-10-CM | POA: Diagnosis not present

## 2019-07-19 DIAGNOSIS — Z79899 Other long term (current) drug therapy: Secondary | ICD-10-CM

## 2019-07-19 DIAGNOSIS — S199XXA Unspecified injury of neck, initial encounter: Secondary | ICD-10-CM | POA: Diagnosis not present

## 2019-07-19 DIAGNOSIS — Z87440 Personal history of urinary (tract) infections: Secondary | ICD-10-CM | POA: Diagnosis not present

## 2019-07-19 DIAGNOSIS — Z96653 Presence of artificial knee joint, bilateral: Secondary | ICD-10-CM | POA: Diagnosis present

## 2019-07-19 DIAGNOSIS — M25519 Pain in unspecified shoulder: Secondary | ICD-10-CM | POA: Diagnosis not present

## 2019-07-19 DIAGNOSIS — R2689 Other abnormalities of gait and mobility: Secondary | ICD-10-CM | POA: Diagnosis not present

## 2019-07-19 DIAGNOSIS — Z20822 Contact with and (suspected) exposure to covid-19: Secondary | ICD-10-CM | POA: Diagnosis present

## 2019-07-19 DIAGNOSIS — I110 Hypertensive heart disease with heart failure: Secondary | ICD-10-CM | POA: Diagnosis present

## 2019-07-19 DIAGNOSIS — M797 Fibromyalgia: Secondary | ICD-10-CM | POA: Diagnosis present

## 2019-07-19 DIAGNOSIS — S42221D 2-part displaced fracture of surgical neck of right humerus, subsequent encounter for fracture with routine healing: Secondary | ICD-10-CM | POA: Diagnosis not present

## 2019-07-19 DIAGNOSIS — Z8249 Family history of ischemic heart disease and other diseases of the circulatory system: Secondary | ICD-10-CM

## 2019-07-19 DIAGNOSIS — K219 Gastro-esophageal reflux disease without esophagitis: Secondary | ICD-10-CM | POA: Diagnosis present

## 2019-07-19 DIAGNOSIS — Z961 Presence of intraocular lens: Secondary | ICD-10-CM | POA: Diagnosis present

## 2019-07-19 DIAGNOSIS — M19012 Primary osteoarthritis, left shoulder: Secondary | ICD-10-CM | POA: Diagnosis not present

## 2019-07-19 DIAGNOSIS — S42211A Unspecified displaced fracture of surgical neck of right humerus, initial encounter for closed fracture: Secondary | ICD-10-CM

## 2019-07-19 DIAGNOSIS — Z885 Allergy status to narcotic agent status: Secondary | ICD-10-CM

## 2019-07-19 DIAGNOSIS — Z9181 History of falling: Secondary | ICD-10-CM | POA: Diagnosis not present

## 2019-07-19 DIAGNOSIS — M19011 Primary osteoarthritis, right shoulder: Secondary | ICD-10-CM | POA: Diagnosis not present

## 2019-07-19 DIAGNOSIS — E785 Hyperlipidemia, unspecified: Secondary | ICD-10-CM | POA: Diagnosis not present

## 2019-07-19 DIAGNOSIS — R2681 Unsteadiness on feet: Secondary | ICD-10-CM | POA: Diagnosis not present

## 2019-07-19 DIAGNOSIS — Z880 Allergy status to penicillin: Secondary | ICD-10-CM

## 2019-07-19 DIAGNOSIS — I2510 Atherosclerotic heart disease of native coronary artery without angina pectoris: Secondary | ICD-10-CM | POA: Diagnosis not present

## 2019-07-19 DIAGNOSIS — I5033 Acute on chronic diastolic (congestive) heart failure: Secondary | ICD-10-CM | POA: Diagnosis not present

## 2019-07-19 DIAGNOSIS — M6281 Muscle weakness (generalized): Secondary | ICD-10-CM | POA: Diagnosis not present

## 2019-07-19 DIAGNOSIS — I482 Chronic atrial fibrillation, unspecified: Secondary | ICD-10-CM | POA: Diagnosis not present

## 2019-07-19 LAB — COMPREHENSIVE METABOLIC PANEL
ALT: 12 U/L (ref 0–44)
AST: 19 U/L (ref 15–41)
Albumin: 3.3 g/dL — ABNORMAL LOW (ref 3.5–5.0)
Alkaline Phosphatase: 84 U/L (ref 38–126)
Anion gap: 9 (ref 5–15)
BUN: 12 mg/dL (ref 8–23)
CO2: 24 mmol/L (ref 22–32)
Calcium: 8.4 mg/dL — ABNORMAL LOW (ref 8.9–10.3)
Chloride: 107 mmol/L (ref 98–111)
Creatinine, Ser: 0.54 mg/dL (ref 0.44–1.00)
GFR calc Af Amer: >60 mL/min (ref >60)
GFR calc non Af Amer: >60 mL/min (ref >60)
Glucose, Bld: 126 mg/dL — ABNORMAL HIGH (ref 70–99)
Potassium: 3.4 mmol/L — ABNORMAL LOW (ref 3.5–5.1)
Sodium: 140 mmol/L (ref 135–145)
Total Bilirubin: 0.8 mg/dL (ref 0.3–1.2)
Total Protein: 6.7 g/dL (ref 6.5–8.1)

## 2019-07-19 LAB — CBC WITH DIFFERENTIAL/PLATELET
Abs Immature Granulocytes: 0.1 10*3/uL — ABNORMAL HIGH (ref 0.00–0.07)
Basophils Absolute: 0 10*3/uL (ref 0.0–0.1)
Basophils Relative: 0 %
Eosinophils Absolute: 0.2 10*3/uL (ref 0.0–0.5)
Eosinophils Relative: 2 %
HCT: 36.8 % (ref 36.0–46.0)
Hemoglobin: 11.5 g/dL — ABNORMAL LOW (ref 12.0–15.0)
Immature Granulocytes: 1 %
Lymphocytes Relative: 11 %
Lymphs Abs: 1 10*3/uL (ref 0.7–4.0)
MCH: 27.9 pg (ref 26.0–34.0)
MCHC: 31.3 g/dL (ref 30.0–36.0)
MCV: 89.3 fL (ref 80.0–100.0)
Monocytes Absolute: 0.6 10*3/uL (ref 0.1–1.0)
Monocytes Relative: 7 %
Neutro Abs: 7.7 10*3/uL (ref 1.7–7.7)
Neutrophils Relative %: 79 %
Platelets: 154 10*3/uL (ref 150–400)
RBC: 4.12 MIL/uL (ref 3.87–5.11)
RDW: 16.6 % — ABNORMAL HIGH (ref 11.5–15.5)
WBC: 9.6 10*3/uL (ref 4.0–10.5)
nRBC: 0 % (ref 0.0–0.2)

## 2019-07-19 LAB — PROTIME-INR
INR: 1.9 — ABNORMAL HIGH (ref 0.8–1.2)
Prothrombin Time: 21 seconds — ABNORMAL HIGH (ref 11.4–15.2)

## 2019-07-19 LAB — SARS CORONAVIRUS 2 BY RT PCR (HOSPITAL ORDER, PERFORMED IN ~~LOC~~ HOSPITAL LAB): SARS Coronavirus 2: NEGATIVE

## 2019-07-19 LAB — APTT: aPTT: 39 seconds — ABNORMAL HIGH (ref 24–36)

## 2019-07-19 MED ORDER — ALLOPURINOL 300 MG PO TABS
300.0000 mg | ORAL_TABLET | Freq: Every day | ORAL | Status: DC
  Administered 2019-07-20 – 2019-07-23 (×4): 300 mg via ORAL
  Filled 2019-07-19 (×4): qty 1

## 2019-07-19 MED ORDER — TRANEXAMIC ACID-NACL 1000-0.7 MG/100ML-% IV SOLN: 1000.0000 mg | INTRAVENOUS | Status: DC

## 2019-07-19 MED ORDER — NETARSUDIL DIMESYLATE 0.02 % OP SOLN: 1.0000 [drp] | Freq: Every day | OPHTHALMIC | Status: DC

## 2019-07-19 MED ORDER — HYDROCODONE-ACETAMINOPHEN 5-325 MG PO TABS
0.5000 | ORAL_TABLET | Freq: Three times a day (TID) | ORAL | Status: DC | PRN
  Administered 2019-07-19 – 2019-07-20 (×2): 1 via ORAL
  Filled 2019-07-19 (×2): qty 1

## 2019-07-19 MED ORDER — AMLODIPINE BESYLATE 5 MG PO TABS
5.0000 mg | ORAL_TABLET | Freq: Every day | ORAL | Status: DC
  Administered 2019-07-20 – 2019-07-23 (×4): 5 mg via ORAL
  Filled 2019-07-19 (×4): qty 1

## 2019-07-19 MED ORDER — LORATADINE 10 MG PO TABS
10.0000 mg | ORAL_TABLET | Freq: Every day | ORAL | Status: DC
  Administered 2019-07-20 – 2019-07-23 (×4): 10 mg via ORAL
  Filled 2019-07-19 (×4): qty 1

## 2019-07-19 MED ORDER — BRIMONIDINE TARTRATE 0.2 % OP SOLN
1.0000 [drp] | Freq: Two times a day (BID) | OPHTHALMIC | Status: DC
  Administered 2019-07-20 – 2019-07-23 (×7): 1 [drp] via OPHTHALMIC
  Filled 2019-07-19 (×2): qty 5

## 2019-07-19 MED ORDER — DORZOLAMIDE HCL-TIMOLOL MAL 2-0.5 % OP SOLN
1.0000 [drp] | Freq: Two times a day (BID) | OPHTHALMIC | Status: DC
  Administered 2019-07-20 – 2019-07-23 (×7): 1 [drp] via OPHTHALMIC
  Filled 2019-07-19 (×2): qty 10

## 2019-07-19 MED ORDER — POTASSIUM CHLORIDE ER 10 MEQ PO TBCR: 10.0000 meq | EXTENDED_RELEASE_TABLET | Freq: Every day | ORAL | Status: DC

## 2019-07-19 MED ORDER — FENTANYL CITRATE (PF) 100 MCG/2ML IJ SOLN
50.0000 ug | Freq: Once | INTRAMUSCULAR | Status: AC
  Administered 2019-07-19: 50 ug via INTRAVENOUS
  Filled 2019-07-19: qty 2

## 2019-07-19 MED ORDER — CEFAZOLIN SODIUM-DEXTROSE 2-4 GM/100ML-% IV SOLN
2.0000 g | INTRAVENOUS | Status: AC
  Administered 2019-07-20: 2 g via INTRAVENOUS
  Filled 2019-07-19: qty 100

## 2019-07-19 MED ORDER — NEBIVOLOL HCL 10 MG PO TABS
10.0000 mg | ORAL_TABLET | Freq: Every day | ORAL | Status: DC
  Administered 2019-07-20 – 2019-07-22 (×3): 10 mg via ORAL
  Filled 2019-07-19 (×5): qty 1

## 2019-07-19 MED ORDER — POTASSIUM CHLORIDE IN NACL 20-0.9 MEQ/L-% IV SOLN
INTRAVENOUS | Status: DC
  Filled 2019-07-19 (×2): qty 1000

## 2019-07-19 MED ORDER — FAMOTIDINE 20 MG PO TABS
20.0000 mg | ORAL_TABLET | Freq: Two times a day (BID) | ORAL | Status: DC
  Administered 2019-07-20 – 2019-07-23 (×7): 20 mg via ORAL
  Filled 2019-07-19 (×7): qty 1

## 2019-07-19 MED ORDER — POTASSIUM CHLORIDE CRYS ER 20 MEQ PO TBCR
20.0000 meq | EXTENDED_RELEASE_TABLET | Freq: Every day | ORAL | Status: DC
  Administered 2019-07-20 – 2019-07-23 (×4): 20 meq via ORAL
  Filled 2019-07-19 (×4): qty 1

## 2019-07-19 MED ORDER — MORPHINE SULFATE (PF) 4 MG/ML IV SOLN
4.0000 mg | Freq: Once | INTRAVENOUS | Status: AC
  Administered 2019-07-19: 4 mg via INTRAVENOUS
  Filled 2019-07-19: qty 1

## 2019-07-19 MED ORDER — IPRATROPIUM BROMIDE 0.06 % NA SOLN
1.0000 | Freq: Every day | NASAL | Status: DC | PRN
  Filled 2019-07-19 (×2): qty 15

## 2019-07-19 MED ORDER — MUPIROCIN 2 % EX OINT
1.0000 "application " | TOPICAL_OINTMENT | Freq: Two times a day (BID) | CUTANEOUS | Status: DC
  Administered 2019-07-20 – 2019-07-23 (×4): 1 via TOPICAL
  Filled 2019-07-19 (×2): qty 22

## 2019-07-19 MED ORDER — TRIAMCINOLONE ACETONIDE 55 MCG/ACT NA AERO
2.0000 | INHALATION_SPRAY | Freq: Every day | NASAL | Status: DC | PRN
  Filled 2019-07-19: qty 21.6

## 2019-07-19 MED ORDER — LATANOPROST 0.005 % OP SOLN
1.0000 [drp] | Freq: Every day | OPHTHALMIC | Status: DC
  Administered 2019-07-20 – 2019-07-22 (×3): 1 [drp] via OPHTHALMIC
  Filled 2019-07-19: qty 2.5

## 2019-07-19 MED ORDER — SILVER SULFADIAZINE 1 % EX CREA
1.0000 "application " | TOPICAL_CREAM | Freq: Every day | CUTANEOUS | Status: DC
  Administered 2019-07-20 – 2019-07-21 (×2): 1 via TOPICAL
  Filled 2019-07-19: qty 85

## 2019-07-19 MED ORDER — FUROSEMIDE 20 MG PO TABS
20.0000 mg | ORAL_TABLET | Freq: Every day | ORAL | Status: DC
  Administered 2019-07-21 – 2019-07-23 (×3): 20 mg via ORAL
  Filled 2019-07-19 (×3): qty 1

## 2019-07-19 MED ORDER — ONDANSETRON HCL 4 MG/2ML IJ SOLN
4.0000 mg | Freq: Once | INTRAMUSCULAR | Status: AC
  Administered 2019-07-19: 4 mg via INTRAVENOUS
  Filled 2019-07-19: qty 2

## 2019-07-19 NOTE — ED Notes (Signed)
Patient arrived via [facility] c/o shoulder pain after a fall at home. Patient hit right shoulder when she fell, 10/10 pain.  Patient does take coumadin but denies LOC or that she hit her head.  VSS with EMS.

## 2019-07-19 NOTE — ED Notes (Signed)
Patient transported to [facility].

## 2019-07-19 NOTE — H&P (Signed)
[name] is an 85 y.o. female.   Chief Complaint: Right shoulder pain HPI: 85 yo female with a history of chronic shoulder arthritis that reports severe pain and loss of remaining function after a twisting fall today. She has had a previous left shoulder replacement by Dr Onnie Graham which is doing well.  Her right shoulder function prior to the fall was poor.  She denies any new neck or back pain although she states that she has chronic pain everywhere from fibromyalgia and other ortho issues.  She lives at home with her husband.   Past Medical History:  Diagnosis Date  . Atrial fibrillation ([place])   . Carotid artery occlusion   . Chronic diastolic CHF (congestive heart failure) (HCC)    Hypertensive heart disease 02-12-14  . Crohn's disease ([place])   . Detached retina   . Fibromyalgia   . GERD (gastroesophageal reflux disease)   . Gout   . H/O hiatal hernia   . High triglycerides   . History of stomach ulcers 1950's  . Hypertension   . Long term current use of anticoagulant   . Obstructive sleep apnea on CPAP   . Osteoarthritis    PAIN AND OA LEF T HIP AND BOTH SHOULDERS ARE BONE ON BONE AND PAINFUL  . Peripheral vascular disease (HCC)    KNOWN RIGHT INTERNAL CAROTID ARTERY OCCLUSION (NO STROKE)  --40 TO 59% STENOSIS LEFT ICA-FOLLOWED BY DR. EARLY WITH DOPPLER STUDY EVERY 6 MONTHS  . Pulmonary embolism ([place]) 2011   a. Hx of PE in 02/2009 after R hip surgery, venous dopplers negative, long-term Coumadin.  [name] Kitchen PVC (premature ventricular contraction)    PT STATES HX OF PVC'S ON EKG  . Recurrent upper respiratory infection (URI)    BRONCHITIS FEB 2013--SLIGHT COUGH NON-PRODUCTIVE NOW  . Recurrent UTI (urinary tract infection)    "on daily medicine" (05/14/2012)  . Tinnitus   . Vertigo     Past Surgical History:  Procedure Laterality Date  . APPENDECTOMY  1950's  . BACK SURGERY  10/29/06   Central and foraminal decompression L3-L4, L4-L5, and L5-S1 with inspection of L4-L5 and L5-S1 disc  on the right  . CARDIAC CATHETERIZATION  1970's   "maybe 2" (05/14/2012)  . CARDIAC CATHETERIZATION    . CATARACT EXTRACTION W/ INTRAOCULAR LENS IMPLANT Right 2009  . CHOLECYSTECTOMY  ~ 1988  . DECOMPRESSIVE LUMBAR LAMINECTOMY LEVEL 3  ~ 2002  . DILATION AND CURETTAGE OF UTERUS     "several; from miscarriages" (05/14/2012)  . EXCISION MORTON'S NEUROMA Right 05/20/00   "foot" (05/14/2012)  . EYE SURGERY Left 2014   Detached retina  . EYE SURGERY Left August 23, 2013   Cataract  . FEMUR FRACTURE SURGERY Right 02/21/09   Open reduction internal fixation of right periprosthetic  femur fracture utilizing Zimmer cables times fiv  . FRACTURE SURGERY  2011   Right Femur Fx  . HAMMER TOE SURGERY Left 10/25/08   "toe next to big to" (05/14/2012)  . JOINT REPLACEMENT  2009   Right Hip replacement  . JOINT REPLACEMENT  2012   Right knee replacement  . JOINT REPLACEMENT  06/17/11   Left Hip replacement  . KNEE ARTHROSCOPY Right 07/26/05  . REPLACEMENT TOTAL KNEE Right 02/19/2010  . SPINE SURGERY    . [place]?  . TOTAL HIP ARTHROPLASTY Right 12/08/02   Osteonics total hip replacement  . TOTAL HIP ARTHROPLASTY  06/17/2011   Procedure: TOTAL HIP ARTHROPLASTY;  Surgeon: Pilar Plate  Zella Ball, MD;  Location: [facility];  Service: Orthopedics;  Laterality: Left;  . TOTAL SHOULDER ARTHROPLASTY Left 05/14/2012  . TOTAL SHOULDER ARTHROPLASTY Left 05/14/2012   Procedure: TOTAL SHOULDER ARTHROPLASTY;  Surgeon: Marin Shutter, MD;  Location: [facility];  Service: Orthopedics;  Laterality: Left;  [name] Kitchen VAGINAL HYSTERECTOMY  1971    Family History  Problem Relation Age of Onset  . Heart disease Father        Heart Disease before age 22  . Heart attack Father 64  . Hypertension Father   . Peripheral vascular disease Father        Right  leg amputation  . Heart disease Mother        AAA  . Stroke Mother   . Aneurysm Mother   . Hypertension Mother   . AAA (abdominal aortic aneurysm) Mother   . Diabetes  Paternal Grandfather   . Hypertension Brother   . Heart disease Brother        Heart Disease before age 51  . Hyperlipidemia Brother   . Heart attack Brother   . Hypertension Brother   . Diabetes Son    Social History:  reports that she quit smoking about 33 years ago. Her smoking use included cigarettes. She has a 7.50 pack-year smoking history. She has never used smokeless tobacco. She reports current alcohol use. She reports that she does not use drugs.  Allergies:  Allergies  Allergen Reactions  . Codeine     NAUSEA  . Cymbalta [Duloxetine Hcl]     Nausea VOMITING AND ABDOMINAL PAIN, HEADACHE, JUST ABOUT EVERY SIDE EFFECT THE DRUG HAS  . Diltiazem Cd [Diltiazem Hcl Er Beads]     Heavy legs, cough  . Duloxetine     Other reaction(s): Other (See Comments) Nausea VOMITING AND ABDOMINAL PAIN, HEADACHE, JUST ABOUT EVERY SIDE EFFECT THE DRUG HAS  . Metoprolol     Legs like cement  . Nortriptyline     Nightmares  . Penicillins     RASH  & ITCHING   --PT STATES SHE CAN TAKE KEFLEX PO AND IV CEPHALOSPORINS  . Statins     Leg cramps  . Tetanus Toxoid Adsorbed Swelling  . Tetanus Toxoids     SWELLING, REDNESS  WHOLE ARM  . Ciprofloxacin Rash    RASH  . Lyrica [Pregabalin] Diarrhea, Nausea And Vomiting and Rash  . Penicillin G Rash    (Not in a hospital admission)   Results for orders placed or performed during the hospital encounter of 07/19/19 (from the past 48 hour(s))  Protime-INR     Status: Abnormal   Collection Time: 07/19/19  8:22 PM  Result Value Ref Range   Prothrombin Time 21.0 (H) 11.4 - 15.2 seconds   INR 1.9 (H) 0.8 - 1.2    Comment: (NOTE) INR goal varies based on device and disease states. Performed at [facility], [place] [address] [place], [place] [address]   CBC with Differential     Status: Abnormal   Collection Time: 07/19/19  8:22 PM  Result Value Ref Range   WBC 9.6 4.0 - 10.5 K/uL   RBC 4.12 3.87 - 5.11 MIL/uL   Hemoglobin  11.5 (L) 12.0 - 15.0 g/dL   HCT 36.8 36.0 - 46.0 %   MCV 89.3 80.0 - 100.0 fL   MCH 27.9 26.0 - 34.0 pg   MCHC 31.3 30.0 - 36.0 g/dL   RDW 16.6 (H) 11.5 - 15.5 %   Platelets  154 150 - 400 K/uL   nRBC 0.0 0.0 - 0.2 %   Neutrophils Relative % 79 %   Neutro Abs 7.7 1.7 - 7.7 K/uL   Lymphocytes Relative 11 %   Lymphs Abs 1.0 0.7 - 4.0 K/uL   Monocytes Relative 7 %   Monocytes Absolute 0.6 0.1 - 1.0 K/uL   Eosinophils Relative 2 %   Eosinophils Absolute 0.2 0.0 - 0.5 K/uL   Basophils Relative 0 %   Basophils Absolute 0.0 0.0 - 0.1 K/uL   Immature Granulocytes 1 %   Abs Immature Granulocytes 0.10 (H) 0.00 - 0.07 K/uL    Comment: Performed at [facility], [place] [address], [place], [place] [address]  APTT     Status: Abnormal   Collection Time: 07/19/19  8:22 PM  Result Value Ref Range   aPTT 39 (H) 24 - 36 seconds    Comment:        IF BASELINE aPTT IS ELEVATED, SUGGEST PATIENT RISK ASSESSMENT BE USED TO DETERMINE APPROPRIATE ANTICOAGULANT THERAPY. Performed at [facility], [place] [address] [place], [place] [address]    DG Shoulder Right  Result Date: 07/19/2019 CLINICAL DATA:  [place], right shoulder pain EXAM: RIGHT SHOULDER - 2+ VIEW; RIGHT HUMERUS - 2+ VIEW COMPARISON:  None. FINDINGS: Right shoulder: Internal rotation, external rotation, and transscapular views of the right shoulder are obtained. Evaluation limited by body habitus. There is a comminuted humeral neck fracture with impaction. Severe glenohumeral osteoarthritis. No dislocation. Right humerus: Frontal and lateral views are obtained, limited by body habitus. There is a comminuted fracture of the right humeral neck, with ventral angulation at the fracture site. Osteoarthritis of the glenohumeral joint. No dislocation. The right elbow is unremarkable. IMPRESSION: 1. Comminuted impacted right humeral neck fracture. 2. Severe glenohumeral osteoarthritis.  No dislocation. Electronically Signed    By: Randa Ngo M.D.   On: 07/19/2019 18:08   CT Head Wo Contrast  Result Date: 07/19/2019 CLINICAL DATA:  [place] EXAM: CT HEAD WITHOUT CONTRAST TECHNIQUE: Contiguous axial images were obtained from the base of the skull through the vertex without intravenous contrast. COMPARISON:  11/09/2004 FINDINGS: Brain: There is diffuse hypodensity throughout the right temporal white matter, new since prior study. There is gray-white preservation throughout the majority of the right temporal lobe, with no significant cortical atrophy. Appearance is atypical for ischemic change, and could reflect an element of vasogenic edema. MRI recommended for further evaluation. No other signs of acute infarct or hemorrhage. Lateral ventricles and midline structures are otherwise unremarkable. Chronic small vessel ischemic changes are seen within the bilateral basal ganglia. There are no acute extra-axial fluid collections. Vascular: No hyperdense vessel or unexpected calcification. Skull: Normal. Negative for fracture or focal lesion. Sinuses/Orbits: No acute finding. Other: None. IMPRESSION: 1. Diffuse hypodensity throughout the right temporal lobe, with gray-white preservation of the majority of the right temporal lobe. This is atypical for ischemic change, and could reflect an element of vasogenic edema. MRI recommended for further evaluation. 2. No evidence of hemorrhage. Electronically Signed   By: Randa Ngo M.D.   On: 07/19/2019 19:11   CT Cervical Spine Wo Contrast  Result Date: 07/19/2019 CLINICAL DATA:  Fall.  On warfarin. EXAM: CT CERVICAL SPINE WITHOUT CONTRAST TECHNIQUE: Multidetector CT imaging of the cervical spine was performed without intravenous contrast. Multiplanar CT image reconstructions were also generated. COMPARISON:  None. FINDINGS: Alignment: No traumatic malalignment. Mild reversal of the normal cervical lordosis. Trace anterolisthesis at C3-C4 and  C4-C5. Skull base and vertebrae: No acute fracture.  No primary bone lesion or focal pathologic process. Soft tissues and spinal canal: No prevertebral fluid or swelling. No visible canal hematoma. Disc levels: Moderate to severe disc height loss and moderate uncovertebral hypertrophy at C5-C6. Diffuse moderate left and mild right facet arthropathy throughout the cervical spine. Upper chest: Negative. Other: None. IMPRESSION: 1. No acute cervical spine fracture or traumatic malalignment. 2. Moderate to severe degenerative changes at C5-C6. Electronically Signed   By: Titus Dubin M.D.   On: 07/19/2019 19:57   CT SHOULDER RIGHT WO CONTRAST  Result Date: 07/19/2019 CLINICAL DATA:  Proximal humerus fracture after fall. EXAM: CT OF THE UPPER RIGHT EXTREMITY WITHOUT CONTRAST TECHNIQUE: Multidetector CT imaging of the upper right extremity was performed according to the standard protocol. COMPARISON:  Right shoulder and humerus x-rays from same day. FINDINGS: Bones/Joint/Cartilage Acute fracture of the proximal humerus surgical neck with 7 mm medial and anterior displacement. The fracture extends to the base of the greater tuberosity. No involvement of the lesser tuberosity. No dislocation. Severe glenohumeral joint space narrowing with prominent subchondral cystic change. Small glenohumeral joint effusion with multiple small intra-articular bodies. Mild acromioclavicular joint osteoarthritis. Ligaments Ligaments are suboptimally evaluated by CT. Muscles and Tendons Grossly intact.  No significant muscle atrophy. Soft tissue No fluid collection or hematoma. No soft tissue mass. The visualized right lung is clear. IMPRESSION: 1. Acute mildly displaced fracture of the proximal humerus surgical neck as described above. 2. Severe glenohumeral osteoarthritis with multiple small intra-articular bodies. Electronically Signed   By: Titus Dubin M.D.   On: 07/19/2019 20:02   DG Humerus Right  Result Date: 07/19/2019 CLINICAL DATA:  [place], right shoulder pain EXAM: RIGHT  SHOULDER - 2+ VIEW; RIGHT HUMERUS - 2+ VIEW COMPARISON:  None. FINDINGS: Right shoulder: Internal rotation, external rotation, and transscapular views of the right shoulder are obtained. Evaluation limited by body habitus. There is a comminuted humeral neck fracture with impaction. Severe glenohumeral osteoarthritis. No dislocation. Right humerus: Frontal and lateral views are obtained, limited by body habitus. There is a comminuted fracture of the right humeral neck, with ventral angulation at the fracture site. Osteoarthritis of the glenohumeral joint. No dislocation. The right elbow is unremarkable. IMPRESSION: 1. Comminuted impacted right humeral neck fracture. 2. Severe glenohumeral osteoarthritis.  No dislocation. Electronically Signed   By: Randa Ngo M.D.   On: 07/19/2019 18:08    Review of Systems  Blood pressure (!) 174/90, pulse 99, temperature 98.4 F (36.9 C), temperature source Oral, resp. rate 18, SpO2 95 %. Physical Exam  AAO, moderate distress due to right shoulder pain. Neck and back nontender Left shoulder non tender with pain free AROM Pain free AROM of the left elbow and wrist with 5/5 strength Right shoulder is swollen and tender. Skin intact Unable to assess ROM due to fracture and pain.  Elbow and wrist grossly intact, NVI distally Bilateral hips, knees, and ankles with pain free AROM Distally NVI bilateral LEs  Assessment/Plan Acute comminuted and displaced right proximal humerus fracture in the setting of chronic severe right shoulder OA. I discussed the options for management of this right shoulder in detail with the patient and her husband who was at the bedside. We discussed both surgical and nonsurgical treatment options. Non surgical would entail sling immobilization and rest with pain management for 4 weeks followed by gentle therapy for a couple of months expecting recovery back to her baseline between 4-6 months.  We discussed the  option for surgical  treatment which for an unstable proximal humerus fracture in the setting of severe OA would be a Reverse total shoulder replacement.  This would allow for immediate AROM and ADLs and would provide great pain relief.  Risks including infection and instability were discussed in detail with the patient.  She would like to consider surgical management at this time.  Will admit for pain control and medical optimization with likely surgery tomorrow   Augustin Schooling, MD 07/19/2019, 8:48 PM

## 2019-07-19 NOTE — ED Provider Notes (Signed)
[facility] DEPT Provider Note   CSN: [id number] Arrival date & time: 07/19/19  1618     History Chief Complaint  Patient presents with   Shoulder Pain   Fall    [name] is a 85 y.o. right-hand-dominant female with past medical history significant for A. Fib on coumadin, chronic diastolic CHF, fibromyalgia, hypertension resents to emergency department today via EMS with chief complaint of mechanical fall.  Patient states she was at home and attempting to move a pillow on the couch when she lost her footing and fell landing on her right shoulder.  She thinks she hit her shoulder on a nearby desk.  She denies hitting her head or loss of consciousness.  She is reporting sharp severe pain in her right shoulder with radiation to her bicep.  She states pain is constant and is worse with movement.  She rates pain 10 of 10 in severity.  She did not take any medications for symptoms prior to arrival.  She denies any headache, neck pain, visual changes, numbness, weakness, tingling, no open wounds or bleeding. She states she had her INR checked x 5 days ago and it was 1.8.    Past Medical History:  Diagnosis Date   Atrial fibrillation ([place])    Carotid artery occlusion    Chronic diastolic CHF (congestive heart failure) (HCC)    Hypertensive heart disease 02-12-14   Crohn's disease ([place])    Detached retina    Fibromyalgia    GERD (gastroesophageal reflux disease)    Gout    H/O hiatal hernia    High triglycerides    History of stomach ulcers 1950's   Hypertension    Long term current use of anticoagulant    Obstructive sleep apnea on CPAP    Osteoarthritis    PAIN AND OA LEF T HIP AND BOTH SHOULDERS ARE BONE ON BONE AND PAINFUL   Peripheral vascular disease (HCC)    KNOWN RIGHT INTERNAL CAROTID ARTERY OCCLUSION (NO STROKE)  --40 TO 59% STENOSIS LEFT ICA-FOLLOWED BY DR. EARLY WITH DOPPLER STUDY EVERY 6 MONTHS   Pulmonary embolism ([place])  2011   a. Hx of PE in 02/2009 after R hip surgery, venous dopplers negative, long-term Coumadin.   PVC (premature ventricular contraction)    PT STATES HX OF PVC'S ON EKG   Recurrent upper respiratory infection (URI)    BRONCHITIS FEB 2013--SLIGHT COUGH NON-PRODUCTIVE NOW   Recurrent UTI (urinary tract infection)    "on daily medicine" (05/14/2012)   Tinnitus    Vertigo     Patient Active Problem List   Diagnosis Date Noted   CHF (congestive heart failure) ([place]) 12/27/2018   CAP (community acquired pneumonia) 12/27/2018   Acute on chronic diastolic (congestive) heart failure ([place]) 07/14/2018   Acute on chronic diastolic heart failure ([place]) 07/13/2018   Lumbosacral spondylosis without myelopathy 09/26/2017   Chronic low back pain 07/21/2017   Degeneration of lumbar intervertebral disc 04/21/2017   Lumbar post-laminectomy syndrome 04/21/2017   Esophageal dysphagia 10/15/2016   Gastroesophageal reflux disease without esophagitis 10/15/2016   Seasonal allergic rhinitis 10/15/2016   Chronic atrial fibrillation ([place]) 05/15/2015   Aftercare following surgery of the circulatory system, NEC 09/15/2013   Essential hypertension 06/17/2013   Atrial fibrillation with RVR ([place]) 04/01/2013   Morbid obesity ([place]) 04/01/2013   OSA on CPAP 04/01/2013   Chronic diastolic CHF (congestive heart failure) ([place])    H/o recurrent pulmonary embolism, on Coumadin 05/24/2012   Acute on  chronic diastolic congestive heart failure ([place]) 05/24/2012   Normocytic anemia 05/24/2012   Thrombocytopenia ([place]) 05/24/2012   Inadequate anticoagulation 05/24/2012   Elevated brain natriuretic peptide (BNP) level 05/24/2012   Shoulder arthritis 05/15/2012   Stress incontinence 01/06/2012   Chronic cystitis 01/06/2012   Occlusion and stenosis of carotid artery without mention of cerebral infarction 09/10/2011   OA (osteoarthritis) of hip 06/17/2011   Osteoarthritis 01/08/2011    Fibromyalgia 08/06/2010   Dyslipidemia 08/06/2010   Gout 08/06/2010   Right carotid artery occlusion 08/06/2010    Past Surgical History:  Procedure Laterality Date   APPENDECTOMY  1950's   BACK SURGERY  10/29/06   Central and foraminal decompression L3-L4, L4-L5, and L5-S1 with inspection of L4-L5 and L5-S1 disc on the right   CARDIAC CATHETERIZATION  1970's   "maybe 2" (05/14/2012)   CARDIAC CATHETERIZATION     CATARACT EXTRACTION W/ INTRAOCULAR LENS IMPLANT Right 2009   CHOLECYSTECTOMY  ~ [place] LEVEL 3  ~ 2002   DILATION AND CURETTAGE OF UTERUS     "several; from miscarriages" (05/14/2012)   EXCISION MORTON'S NEUROMA Right 05/20/00   "foot" (05/14/2012)   EYE SURGERY Left 2014   Detached retina   EYE SURGERY Left August 23, 2013   Cataract   FEMUR FRACTURE SURGERY Right 02/21/09   Open reduction internal fixation of right periprosthetic  femur fracture utilizing Zimmer cables times fiv   FRACTURE SURGERY  2011   Right Femur Fx   HAMMER TOE SURGERY Left 10/25/08   "toe next to big to" (05/14/2012)   JOINT REPLACEMENT  2009   Right Hip replacement   JOINT REPLACEMENT  2012   Right knee replacement   JOINT REPLACEMENT  06/17/11   Left Hip replacement   KNEE ARTHROSCOPY Right 07/26/05   REPLACEMENT TOTAL KNEE Right 02/19/2010   [place]?   TOTAL HIP ARTHROPLASTY Right 12/08/02   Osteonics total hip replacement   TOTAL HIP ARTHROPLASTY  06/17/2011   Procedure: TOTAL HIP ARTHROPLASTY;  Surgeon: Gearlean Alf, MD;  Location: [facility];  Service: Orthopedics;  Laterality: Left;   TOTAL SHOULDER ARTHROPLASTY Left 05/14/2012   TOTAL SHOULDER ARTHROPLASTY Left 05/14/2012   Procedure: TOTAL SHOULDER ARTHROPLASTY;  Surgeon: Marin Shutter, MD;  Location: [facility];  Service: Orthopedics;  Laterality: Left;   VAGINAL HYSTERECTOMY  1971     OB History   No obstetric history on file.     Family  History  Problem Relation Age of Onset   Heart disease Father        Heart Disease before age 80   Heart attack Father 108   Hypertension Father    Peripheral vascular disease Father        Right  leg amputation   Heart disease Mother        AAA   Stroke Mother    Aneurysm Mother    Hypertension Mother    AAA (abdominal aortic aneurysm) Mother    Diabetes Paternal Grandfather    Hypertension Brother    Heart disease Brother        Heart Disease before age 56   Hyperlipidemia Brother    Heart attack Brother    Hypertension Brother    Diabetes Son     Social History   Tobacco Use   Smoking status: Former Smoker    Packs/day: 0.50    Years: 15.00    Pack  years: 7.50    Types: Cigarettes    Quit date: 02/11/1986    Years since quitting: 33.4   Smokeless tobacco: Never Used  Substance Use Topics   Alcohol use: Yes    Alcohol/week: 0.0 standard drinks    Comment: 05/14/2012 "have 2-3 drinks/yr" maybe   Drug use: No    Home Medications Prior to Admission medications   Medication Sig Start Date End Date Taking? Authorizing Provider  allopurinol (ZYLOPRIM) 300 MG tablet Take 300 mg by mouth daily. 06/09/16   [provider]  amLODipine (NORVASC) 5 MG tablet Take 5 mg by mouth daily. 04/24/18   [provider]  brimonidine (ALPHAGAN) 0.2 % ophthalmic solution 1 drop 2 (two) times daily.  03/20/18   [provider]  cefpodoxime Bennie Pierini) 200 MG tablet  12/31/18   [provider]  cetirizine (ZYRTEC) 10 MG tablet Take 10 mg by mouth daily.    [provider]  cimetidine (TAGAMET) 200 MG tablet Take 200 mg by mouth 2 (two) times daily.    [provider]  CRANBERRY CONCENTRATE PO Take 1 tablet by mouth daily.    [provider]  dorzolamide-timolol (COSOPT) 22.3-6.8 MG/ML ophthalmic solution Place 1 drop into both eyes 2 (two) times daily.  06/28/13   [provider]  FLUZONE HIGH-DOSE  QUADRIVALENT 0.7 ML SUSY ADM 0.7ML IM UTD 10/15/18   [provider]  furosemide (LASIX) 20 MG tablet Take 1 tab (110m)  daily for a week, then continue 1/2 tab (11m daily 12/31/18   Rai, RiVernelle EmeraldMD  HYDROcodone-acetaminophen (NORCO/VICODIN) 5-325 MG tablet Take 0.5-1 tablets by mouth 3 (three) times daily as needed for pain.  06/10/18   [provider]  ipratropium (ATROVENT) 0.06 % nasal spray Place 1 spray into both nostrils daily as needed (allergies).  04/13/18   [provider]  LUMIGAN 0.01 % SOLN Place 1 drop into both eyes at bedtime. 09/04/16   [provider]  mupirocin ointment (BACTROBAN) 2 % Apply 1 application topically 2 (two) times daily. 04/22/17   WaTrula SladeDPM  nebivolol (BYSTOLIC) 10 MG tablet Take 1 tablet (10 mg total) by mouth at bedtime. 05/19/18   JoMartiniquePeter M, MD  potassium chloride (KLOR-CON) 10 MEQ tablet Take 2 tablets (20 mEq total) by mouth daily. 12/31/18 03/01/19  Rai, RiVernelle EmeraldMD  potassium chloride (KLOR-CON) 10 MEQ tablet  02/07/19   [provider]  silver sulfADIAZINE (SILVADENE) 1 % cream Apply 1 application topically daily. 12/12/15   Tuchman, Richard C,Loletha GrayerDPM  triamcinolone (NASACORT ALLERGY 24HR) 55 MCG/ACT AERO nasal inhaler Place 2 sprays into the nose daily as needed (allergies).     [provider]  warfarin (COUMADIN) 2.5 MG tablet TAKE 1 TO 1&1/2 TABLETS DAILY AS DIRECTED BY COUMADIN CLINIC. 05/10/19   JoMartiniquePeter M, MD    Allergies    Codeine, Cymbalta [duloxetine hcl], Diltiazem cd [diltiazem hcl er beads], Duloxetine, Metoprolol, Nortriptyline, Penicillins, Statins, Tetanus toxoid adsorbed, Tetanus toxoids, Ciprofloxacin, Lyrica [pregabalin], and Penicillin g  Review of Systems   Review of Systems All other systems are reviewed and are negative for acute change except as noted in the HPI.  Physical Exam Updated Vital Signs BP (!) 162/103 (BP Location: Left Arm)    Pulse (!) 101     Temp 98.4 F (36.9 C) (Oral)    Resp 16    SpO2 99%   Physical Exam Vitals and nursing note reviewed.  Constitutional:  General: She is not in acute distress.    Appearance: She is not ill-appearing.  HENT:     Head: Normocephalic and atraumatic.     Comments: No tenderness to palpation of skull. No deformities or crepitus noted. No open wounds, abrasions or lacerations.    Right Ear: Tympanic membrane and external ear normal.     Left Ear: Tympanic membrane and external ear normal.     Nose: Nose normal.     Mouth/Throat:     Mouth: Mucous membranes are moist.     Pharynx: Oropharynx is clear.  Eyes:     General: No scleral icterus.       Right eye: No discharge.        Left eye: No discharge.     Extraocular Movements: Extraocular movements intact.     Conjunctiva/sclera: Conjunctivae normal.     Pupils: Pupils are equal, round, and reactive to light.  Neck:     Vascular: No JVD.     Comments: Full ROM intact without spinous process TTP. No bony stepoffs or deformities, no paraspinous muscle TTP or muscle spasms. No rigidity or meningeal signs. No bruising, erythema, or swelling.  Cardiovascular:     Rate and Rhythm: Normal rate and regular rhythm.     Pulses: Normal pulses.          Radial pulses are 2+ on the right side and 2+ on the left side.     Heart sounds: Normal heart sounds.  Pulmonary:     Comments: Lungs clear to auscultation in all fields. Symmetric chest rise. No wheezing, rales, or rhonchi. Abdominal:     Comments: Abdomen is soft, non-distended, and non-tender in all quadrants. No rigidity, no guarding. No peritoneal signs.  Musculoskeletal:        General: Normal range of motion.     Cervical back: Normal range of motion.     Comments: Right shoulder is swollen and tender to anterior palpation. Skin intact without tenting.  Decreased range of motion of right shoulder secondary to pain. Neurovascular intact distally. Range of motion of right elbow and  wrist.  Strong grip strength.    Radial pulse 2+ bilaterally  Pelvis is stable.  Full range of motion of bilateral knees and ankles.  Skin:    General: Skin is warm and dry.     Capillary Refill: Capillary refill takes less than 2 seconds.  Neurological:     Mental Status: She is oriented to person, place, and time.     GCS: GCS eye subscore is 4. GCS verbal subscore is 5. GCS motor subscore is 6.     Comments: Speech is clear and goal oriented, follows commands CN III-XII intact, no facial droop   Psychiatric:        Behavior: Behavior normal.     ED Results / Procedures / Treatments   Labs (all labs ordered are listed, but only abnormal results are displayed) Labs Reviewed  PROTIME-INR - Abnormal; Notable for the following components:      Result Value   Prothrombin Time 21.0 (*)    INR 1.9 (*)    All other components within normal limits  COMPREHENSIVE METABOLIC PANEL - Abnormal; Notable for the following components:   Potassium 3.4 (*)    Glucose, Bld 126 (*)    Calcium 8.4 (*)    Albumin 3.3 (*)    All other components within normal limits  CBC WITH DIFFERENTIAL/PLATELET - Abnormal; Notable for the following components:  Hemoglobin 11.5 (*)    RDW 16.6 (*)    Abs Immature Granulocytes 0.10 (*)    All other components within normal limits  APTT - Abnormal; Notable for the following components:   aPTT 39 (*)    All other components within normal limits  SARS CORONAVIRUS 2 BY RT PCR (HOSPITAL ORDER, [facility])    EKG None  Radiology DG Shoulder Right  Result Date: 07/19/2019 CLINICAL DATA:  [place], right shoulder pain EXAM: RIGHT SHOULDER - 2+ VIEW; RIGHT HUMERUS - 2+ VIEW COMPARISON:  None. FINDINGS: Right shoulder: Internal rotation, external rotation, and transscapular views of the right shoulder are obtained. Evaluation limited by body habitus. There is a comminuted humeral neck fracture with impaction. Severe glenohumeral  osteoarthritis. No dislocation. Right humerus: Frontal and lateral views are obtained, limited by body habitus. There is a comminuted fracture of the right humeral neck, with ventral angulation at the fracture site. Osteoarthritis of the glenohumeral joint. No dislocation. The right elbow is unremarkable. IMPRESSION: 1. Comminuted impacted right humeral neck fracture. 2. Severe glenohumeral osteoarthritis.  No dislocation. Electronically Signed   By: Randa Ngo M.D.   On: 07/19/2019 18:08   CT Head Wo Contrast  Result Date: 07/19/2019 CLINICAL DATA:  [place] EXAM: CT HEAD WITHOUT CONTRAST TECHNIQUE: Contiguous axial images were obtained from the base of the skull through the vertex without intravenous contrast. COMPARISON:  11/09/2004 FINDINGS: Brain: There is diffuse hypodensity throughout the right temporal white matter, new since prior study. There is gray-white preservation throughout the majority of the right temporal lobe, with no significant cortical atrophy. Appearance is atypical for ischemic change, and could reflect an element of vasogenic edema. MRI recommended for further evaluation. No other signs of acute infarct or hemorrhage. Lateral ventricles and midline structures are otherwise unremarkable. Chronic small vessel ischemic changes are seen within the bilateral basal ganglia. There are no acute extra-axial fluid collections. Vascular: No hyperdense vessel or unexpected calcification. Skull: Normal. Negative for fracture or focal lesion. Sinuses/Orbits: No acute finding. Other: None. IMPRESSION: 1. Diffuse hypodensity throughout the right temporal lobe, with gray-white preservation of the majority of the right temporal lobe. This is atypical for ischemic change, and could reflect an element of vasogenic edema. MRI recommended for further evaluation. 2. No evidence of hemorrhage. Electronically Signed   By: Randa Ngo M.D.   On: 07/19/2019 19:11   CT Cervical Spine Wo Contrast  Result  Date: 07/19/2019 CLINICAL DATA:  Fall.  On warfarin. EXAM: CT CERVICAL SPINE WITHOUT CONTRAST TECHNIQUE: Multidetector CT imaging of the cervical spine was performed without intravenous contrast. Multiplanar CT image reconstructions were also generated. COMPARISON:  None. FINDINGS: Alignment: No traumatic malalignment. Mild reversal of the normal cervical lordosis. Trace anterolisthesis at C3-C4 and C4-C5. Skull base and vertebrae: No acute fracture. No primary bone lesion or focal pathologic process. Soft tissues and spinal canal: No prevertebral fluid or swelling. No visible canal hematoma. Disc levels: Moderate to severe disc height loss and moderate uncovertebral hypertrophy at C5-C6. Diffuse moderate left and mild right facet arthropathy throughout the cervical spine. Upper chest: Negative. Other: None. IMPRESSION: 1. No acute cervical spine fracture or traumatic malalignment. 2. Moderate to severe degenerative changes at C5-C6. Electronically Signed   By: Titus Dubin M.D.   On: 07/19/2019 19:57   CT SHOULDER RIGHT WO CONTRAST  Result Date: 07/19/2019 CLINICAL DATA:  Proximal humerus fracture after fall. EXAM: CT OF THE UPPER RIGHT EXTREMITY WITHOUT CONTRAST TECHNIQUE: Multidetector CT  imaging of the upper right extremity was performed according to the standard protocol. COMPARISON:  Right shoulder and humerus x-rays from same day. FINDINGS: Bones/Joint/Cartilage Acute fracture of the proximal humerus surgical neck with 7 mm medial and anterior displacement. The fracture extends to the base of the greater tuberosity. No involvement of the lesser tuberosity. No dislocation. Severe glenohumeral joint space narrowing with prominent subchondral cystic change. Small glenohumeral joint effusion with multiple small intra-articular bodies. Mild acromioclavicular joint osteoarthritis. Ligaments Ligaments are suboptimally evaluated by CT. Muscles and Tendons Grossly intact.  No significant muscle atrophy. Soft  tissue No fluid collection or hematoma. No soft tissue mass. The visualized right lung is clear. IMPRESSION: 1. Acute mildly displaced fracture of the proximal humerus surgical neck as described above. 2. Severe glenohumeral osteoarthritis with multiple small intra-articular bodies. Electronically Signed   By: Titus Dubin M.D.   On: 07/19/2019 20:02   DG Humerus Right  Result Date: 07/19/2019 CLINICAL DATA:  [place], right shoulder pain EXAM: RIGHT SHOULDER - 2+ VIEW; RIGHT HUMERUS - 2+ VIEW COMPARISON:  None. FINDINGS: Right shoulder: Internal rotation, external rotation, and transscapular views of the right shoulder are obtained. Evaluation limited by body habitus. There is a comminuted humeral neck fracture with impaction. Severe glenohumeral osteoarthritis. No dislocation. Right humerus: Frontal and lateral views are obtained, limited by body habitus. There is a comminuted fracture of the right humeral neck, with ventral angulation at the fracture site. Osteoarthritis of the glenohumeral joint. No dislocation. The right elbow is unremarkable. IMPRESSION: 1. Comminuted impacted right humeral neck fracture. 2. Severe glenohumeral osteoarthritis.  No dislocation. Electronically Signed   By: Randa Ngo M.D.   On: 07/19/2019 18:08    Procedures Procedures (including critical care time)  Medications Ordered in ED Medications  morphine 4 MG/ML injection 4 mg (4 mg Intravenous Given 07/19/19 1708)  ondansetron (ZOFRAN) injection 4 mg (4 mg Intravenous Given 07/19/19 1708)  fentaNYL (SUBLIMAZE) injection 50 mcg (50 mcg Intravenous Given 07/19/19 1902)    ED Course  I have reviewed the triage vital signs and the nursing notes.  Pertinent labs & imaging results that were available during my care of the patient were reviewed by me and considered in my medical decision making (see chart for details).    MDM Rules/Calculators/A&P                     History provided by patient with additional history  obtained from chart review.    Patient seen and examined. Patient presents awake, alert, hemodynamically stable, afebrile, non toxic.  On exam she has tenderness palpation of right shoulder with obvious deformity.  Decreased range of motion secondary to pain.  She is neurovascularly intact distally.  Strong radial pulse 2+ bilaterally.  Xray or right shoulder shows right comminuted impacted right humeral neck fracture.Patient given morphine and fentanyl for pain. CT head shows no acute traumatic findings, no bleeding or skull fracture. Radiologist does comment on diffuse hypodensity throughout the right temporal lobe, with gray-white preservation of the majority of the right temporal lobe. Informed patient of incidental findings and recommend outpatient MRI. CT cervical spine without acute traumatic findings. Patient has also been evaluated by ED attending who agrees with plan of care. Case discussed with on-call orthopedist for [facility] Dr. Veverly Fells who evaluated patient at the bedside and is requesting further imaging with CT shoulder. After reviewing CT shoulder Dr. Veverly Fells is planning to take patient to the OR tomorrow.  Basic labs were  collected and shows no leukocytosis, anemia consistent with baseline, no severe electrolyte derangement, no renal insufficiency.  INR was 1.9.  Covid swab still in process.  Dr. Alma Friendly will admit to orthopedic service.  Portions of this note were generated with [employer]. Dictation errors may occur despite best attempts at proofreading.   Final Clinical Impression(s) / ED Diagnoses Final diagnoses:  Closed fracture of neck of right humerus, initial encounter    Rx / DC Orders ED Discharge Orders    None       Flint Melter 07/19/19 2120    Drenda Freeze, MD 07/19/19 [phone]

## 2019-07-19 NOTE — ED Notes (Signed)
Patient's oxygen has dropped a few times into the upper 80s.  Patient says her normal at home is 90-91% and she had oxygen at home that she uses as needed for short periods of time. Patient denies SOB at this time. Changed patient's O2 probe and improved to 94%, however shortly after it started dropping into the upper 80s again, so placed on 1L [place].  [place], [place] and Darl Householder, EDP made aware.

## 2019-07-20 ENCOUNTER — Inpatient Hospital Stay (HOSPITAL_COMMUNITY): Payer: Medicare Other

## 2019-07-20 ENCOUNTER — Inpatient Hospital Stay (HOSPITAL_COMMUNITY): Payer: Medicare Other | Admitting: Anesthesiology

## 2019-07-20 ENCOUNTER — Encounter (HOSPITAL_COMMUNITY): Payer: Self-pay | Admitting: Orthopedic Surgery

## 2019-07-20 ENCOUNTER — Encounter (HOSPITAL_COMMUNITY)
Admission: EM | Disposition: A | Discharge status: Skilled Nursing Facility | Payer: Self-pay | Source: Home / Self Care | Attending: Orthopedic Surgery

## 2019-07-20 HISTORY — PX: REVISION TOTAL SHOULDER TO REVERSE TOTAL SHOULDER: SHX6313

## 2019-07-20 LAB — POCT I-STAT, CHEM 8
BUN: 8 mg/dL (ref 8–23)
Calcium, Ion: 1.19 mmol/L (ref 1.15–1.40)
Chloride: 104 mmol/L (ref 98–111)
Creatinine, Ser: 0.6 mg/dL (ref 0.44–1.00)
Glucose, Bld: 115 mg/dL — ABNORMAL HIGH (ref 70–99)
HCT: 29 % — ABNORMAL LOW (ref 36.0–46.0)
Hemoglobin: 9.9 g/dL — ABNORMAL LOW (ref 12.0–15.0)
Potassium: 3.7 mmol/L (ref 3.5–5.1)
Sodium: 142 mmol/L (ref 135–145)
TCO2: 24 mmol/L (ref 22–32)

## 2019-07-20 LAB — SURGICAL PCR SCREEN
MRSA, PCR: NEGATIVE
Staphylococcus aureus: NEGATIVE

## 2019-07-20 LAB — PROTIME-INR
INR: 1.8 — ABNORMAL HIGH (ref 0.8–1.2)
Prothrombin Time: 20.2 seconds — ABNORMAL HIGH (ref 11.4–15.2)

## 2019-07-20 LAB — TYPE AND SCREEN
ABO/RH(D): A POS
Antibody Screen: NEGATIVE

## 2019-07-20 SURGERY — REVISION, REVERSE TOTAL ARTHROPLASTY, SHOULDER
Anesthesia: General | Site: Shoulder | Laterality: Right

## 2019-07-20 SURGERY — ARTHROPLASTY, SHOULDER, TOTAL, REVERSE
Anesthesia: Choice | Site: Shoulder | Laterality: Right

## 2019-07-20 MED ORDER — DEXAMETHASONE SODIUM PHOSPHATE 10 MG/ML IJ SOLN
INTRAMUSCULAR | Status: AC
  Filled 2019-07-20: qty 1

## 2019-07-20 MED ORDER — CHLORHEXIDINE GLUCONATE 0.12 % MT SOLN
OROMUCOSAL | Status: AC
  Administered 2019-07-20: 15 mL via OROMUCOSAL
  Filled 2019-07-20: qty 15

## 2019-07-20 MED ORDER — POVIDONE-IODINE 10 % EX SWAB
2.0000 "application " | Freq: Once | CUTANEOUS | Status: AC
  Administered 2019-07-20: 2 via TOPICAL

## 2019-07-20 MED ORDER — ORAL CARE MOUTH RINSE: 15.0000 mL | Freq: Once | OROMUCOSAL | Status: AC

## 2019-07-20 MED ORDER — BUPIVACAINE LIPOSOME 1.3 % IJ SUSP
INTRAMUSCULAR | Status: DC | PRN
  Administered 2019-07-20: 10 mL via PERINEURAL

## 2019-07-20 MED ORDER — MIDAZOLAM HCL 2 MG/2ML IJ SOLN: 1.0000 mg | Freq: Once | INTRAMUSCULAR | Status: AC

## 2019-07-20 MED ORDER — LIDOCAINE 2% (20 MG/ML) 5 ML SYRINGE
INTRAMUSCULAR | Status: DC | PRN
  Administered 2019-07-20: 60 mg via INTRAVENOUS

## 2019-07-20 MED ORDER — TRAMADOL HCL 50 MG PO TABS
50.0000 mg | ORAL_TABLET | Freq: Four times a day (QID) | ORAL | Status: DC
  Administered 2019-07-20: 50 mg via ORAL
  Filled 2019-07-20: qty 1

## 2019-07-20 MED ORDER — ALBUMIN HUMAN 5 % IV SOLN
INTRAVENOUS | Status: DC | PRN
Start: 2019-07-20 — End: 2019-07-20

## 2019-07-20 MED ORDER — BUPIVACAINE-EPINEPHRINE (PF) 0.5% -1:200000 IJ SOLN
INTRAMUSCULAR | Status: DC | PRN
Start: 2019-07-20 — End: 2019-07-20
  Administered 2019-07-20: 20 mL via PERINEURAL

## 2019-07-20 MED ORDER — VANCOMYCIN HCL 1 G IV SOLR
INTRAVENOUS | Status: DC | PRN
  Administered 2019-07-20: 1000 mg via TOPICAL

## 2019-07-20 MED ORDER — MENTHOL 3 MG MT LOZG: 1.0000 | LOZENGE | OROMUCOSAL | Status: DC | PRN

## 2019-07-20 MED ORDER — CHLORHEXIDINE GLUCONATE 0.12 % MT SOLN: 15.0000 mL | Freq: Once | OROMUCOSAL | Status: AC

## 2019-07-20 MED ORDER — VANCOMYCIN HCL 1000 MG IV SOLR
INTRAVENOUS | Status: AC
  Filled 2019-07-20: qty 1000

## 2019-07-20 MED ORDER — MORPHINE SULFATE (PF) 2 MG/ML IV SOLN: 0.5000 mg | INTRAVENOUS | Status: DC | PRN

## 2019-07-20 MED ORDER — TRANEXAMIC ACID-NACL 1000-0.7 MG/100ML-% IV SOLN
INTRAVENOUS | Status: AC
  Filled 2019-07-20: qty 100

## 2019-07-20 MED ORDER — TRANEXAMIC ACID-NACL 1000-0.7 MG/100ML-% IV SOLN
1000.0000 mg | Freq: Once | INTRAVENOUS | Status: AC
  Administered 2019-07-20: 1000 mg via INTRAVENOUS
  Filled 2019-07-20: qty 100

## 2019-07-20 MED ORDER — ONDANSETRON HCL 4 MG PO TABS: 4.0000 mg | ORAL_TABLET | Freq: Four times a day (QID) | ORAL | Status: DC | PRN

## 2019-07-20 MED ORDER — ACETAMINOPHEN 325 MG PO TABS: 325.0000 mg | ORAL_TABLET | Freq: Four times a day (QID) | ORAL | Status: DC | PRN

## 2019-07-20 MED ORDER — HYDROCODONE-ACETAMINOPHEN 7.5-325 MG PO TABS
1.0000 | ORAL_TABLET | ORAL | Status: DC | PRN
  Administered 2019-07-23: 2 via ORAL
  Filled 2019-07-20: qty 2

## 2019-07-20 MED ORDER — PROPOFOL 10 MG/ML IV BOLUS
INTRAVENOUS | Status: DC | PRN
  Administered 2019-07-20: 100 mg via INTRAVENOUS

## 2019-07-20 MED ORDER — LIDOCAINE 2% (20 MG/ML) 5 ML SYRINGE
INTRAMUSCULAR | Status: AC
  Filled 2019-07-20: qty 5

## 2019-07-20 MED ORDER — PHENYLEPHRINE HCL-NACL 10-0.9 MG/250ML-% IV SOLN
INTRAVENOUS | Status: DC | PRN
  Administered 2019-07-20: 45 ug/min via INTRAVENOUS

## 2019-07-20 MED ORDER — WARFARIN SODIUM 2.5 MG PO TABS
2.5000 mg | ORAL_TABLET | ORAL | Status: DC
  Administered 2019-07-20 – 2019-07-22 (×3): 2.5 mg via ORAL
  Filled 2019-07-20 (×4): qty 1

## 2019-07-20 MED ORDER — PHENYLEPHRINE 40 MCG/ML (10ML) SYRINGE FOR IV PUSH (FOR BLOOD PRESSURE SUPPORT)
PREFILLED_SYRINGE | INTRAVENOUS | Status: DC | PRN
  Administered 2019-07-20 (×3): 120 ug via INTRAVENOUS

## 2019-07-20 MED ORDER — PHENYLEPHRINE 40 MCG/ML (10ML) SYRINGE FOR IV PUSH (FOR BLOOD PRESSURE SUPPORT)
PREFILLED_SYRINGE | INTRAVENOUS | Status: AC
  Filled 2019-07-20: qty 10

## 2019-07-20 MED ORDER — DEXAMETHASONE SODIUM PHOSPHATE 10 MG/ML IJ SOLN
INTRAMUSCULAR | Status: DC | PRN
  Administered 2019-07-20: 5 mg via INTRAVENOUS

## 2019-07-20 MED ORDER — DOCUSATE SODIUM 100 MG PO CAPS
100.0000 mg | ORAL_CAPSULE | Freq: Two times a day (BID) | ORAL | Status: DC
  Administered 2019-07-20 – 2019-07-23 (×3): 100 mg via ORAL
  Filled 2019-07-20 (×6): qty 1

## 2019-07-20 MED ORDER — FENTANYL CITRATE (PF) 100 MCG/2ML IJ SOLN: 25.0000 ug | INTRAMUSCULAR | Status: DC | PRN

## 2019-07-20 MED ORDER — ONDANSETRON HCL 4 MG/2ML IJ SOLN
4.0000 mg | Freq: Four times a day (QID) | INTRAMUSCULAR | Status: DC | PRN
  Administered 2019-07-20: 4 mg via INTRAVENOUS
  Filled 2019-07-20: qty 2

## 2019-07-20 MED ORDER — MIDAZOLAM HCL 2 MG/2ML IJ SOLN
INTRAMUSCULAR | Status: AC
  Administered 2019-07-20: 1 mg via INTRAVENOUS
  Filled 2019-07-20: qty 2

## 2019-07-20 MED ORDER — ONDANSETRON HCL 4 MG/2ML IJ SOLN: 4.0000 mg | Freq: Once | INTRAMUSCULAR | Status: DC | PRN

## 2019-07-20 MED ORDER — ACETAMINOPHEN 500 MG PO TABS
500.0000 mg | ORAL_TABLET | Freq: Four times a day (QID) | ORAL | Status: AC
  Administered 2019-07-20 – 2019-07-21 (×3): 500 mg via ORAL
  Filled 2019-07-20 (×3): qty 1

## 2019-07-20 MED ORDER — ROCURONIUM BROMIDE 10 MG/ML (PF) SYRINGE
PREFILLED_SYRINGE | INTRAVENOUS | Status: AC
  Filled 2019-07-20: qty 10

## 2019-07-20 MED ORDER — FENTANYL CITRATE (PF) 250 MCG/5ML IJ SOLN
INTRAMUSCULAR | Status: AC
  Filled 2019-07-20: qty 5

## 2019-07-20 MED ORDER — MIRTAZAPINE 15 MG PO TABS
7.5000 mg | ORAL_TABLET | Freq: Every day | ORAL | Status: DC
  Administered 2019-07-20 – 2019-07-22 (×2): 7.5 mg via ORAL
  Filled 2019-07-20 (×3): qty 1

## 2019-07-20 MED ORDER — ONDANSETRON HCL 4 MG/2ML IJ SOLN
INTRAMUSCULAR | Status: DC | PRN
  Administered 2019-07-20: 4 mg via INTRAVENOUS

## 2019-07-20 MED ORDER — METOCLOPRAMIDE HCL 5 MG/ML IJ SOLN: 5.0000 mg | Freq: Three times a day (TID) | INTRAMUSCULAR | Status: DC | PRN

## 2019-07-20 MED ORDER — SUGAMMADEX SODIUM 200 MG/2ML IV SOLN
INTRAVENOUS | Status: DC | PRN
  Administered 2019-07-20: 200 mg via INTRAVENOUS

## 2019-07-20 MED ORDER — PROPOFOL 10 MG/ML IV BOLUS
INTRAVENOUS | Status: AC
  Filled 2019-07-20: qty 20

## 2019-07-20 MED ORDER — ROCURONIUM BROMIDE 10 MG/ML (PF) SYRINGE
PREFILLED_SYRINGE | INTRAVENOUS | Status: DC | PRN
  Administered 2019-07-20: 50 mg via INTRAVENOUS

## 2019-07-20 MED ORDER — ONDANSETRON HCL 4 MG/2ML IJ SOLN: 4.0000 mg | Freq: Four times a day (QID) | INTRAMUSCULAR | Status: DC | PRN

## 2019-07-20 MED ORDER — FENTANYL CITRATE (PF) 100 MCG/2ML IJ SOLN
INTRAMUSCULAR | Status: AC
  Administered 2019-07-20: 50 ug via INTRAVENOUS
  Filled 2019-07-20: qty 2

## 2019-07-20 MED ORDER — LACTATED RINGERS IV SOLN: INTRAVENOUS | Status: DC

## 2019-07-20 MED ORDER — VITAMIN K1 10 MG/ML IJ SOLN
2.0000 mg | Freq: Once | INTRAVENOUS | Status: AC
  Administered 2019-07-20: 2 mg via INTRAVENOUS
  Filled 2019-07-20 (×2): qty 0.2

## 2019-07-20 MED ORDER — CEFAZOLIN SODIUM-DEXTROSE 1-4 GM/50ML-% IV SOLN
1.0000 g | Freq: Four times a day (QID) | INTRAVENOUS | Status: AC
  Administered 2019-07-20 – 2019-07-21 (×3): 1 g via INTRAVENOUS
  Filled 2019-07-20 (×4): qty 50

## 2019-07-20 MED ORDER — WARFARIN SODIUM 2.5 MG PO TABS
3.7500 mg | ORAL_TABLET | ORAL | Status: DC
  Administered 2019-07-23: 3.75 mg via ORAL
  Filled 2019-07-20: qty 1

## 2019-07-20 MED ORDER — PHENOL 1.4 % MT LIQD: 1.0000 | OROMUCOSAL | Status: DC | PRN

## 2019-07-20 MED ORDER — CHLORHEXIDINE GLUCONATE 4 % EX LIQD: 60.0000 mL | Freq: Once | CUTANEOUS | Status: DC

## 2019-07-20 MED ORDER — TRANEXAMIC ACID-NACL 1000-0.7 MG/100ML-% IV SOLN
INTRAVENOUS | Status: DC | PRN
  Administered 2019-07-20: 1000 mg via INTRAVENOUS

## 2019-07-20 MED ORDER — 0.9 % SODIUM CHLORIDE (POUR BTL) OPTIME
TOPICAL | Status: DC | PRN
  Administered 2019-07-20: 1000 mL

## 2019-07-20 MED ORDER — HYDROCODONE-ACETAMINOPHEN 5-325 MG PO TABS
1.0000 | ORAL_TABLET | ORAL | Status: DC | PRN
  Administered 2019-07-21 – 2019-07-22 (×4): 1 via ORAL
  Administered 2019-07-22 (×2): 2 via ORAL
  Administered 2019-07-22: 1 via ORAL
  Filled 2019-07-20 (×3): qty 2
  Filled 2019-07-20: qty 1
  Filled 2019-07-20: qty 2
  Filled 2019-07-20: qty 1

## 2019-07-20 MED ORDER — MORPHINE SULFATE (PF) 2 MG/ML IV SOLN
2.0000 mg | INTRAVENOUS | Status: DC | PRN
  Administered 2019-07-20: 2 mg via INTRAVENOUS
  Filled 2019-07-20: qty 1

## 2019-07-20 MED ORDER — FENTANYL CITRATE (PF) 250 MCG/5ML IJ SOLN
INTRAMUSCULAR | Status: DC | PRN
  Administered 2019-07-20: 100 ug via INTRAVENOUS

## 2019-07-20 MED ORDER — FENTANYL CITRATE (PF) 100 MCG/2ML IJ SOLN: 50.0000 ug | Freq: Once | INTRAMUSCULAR | Status: AC

## 2019-07-20 MED ORDER — ONDANSETRON HCL 4 MG/2ML IJ SOLN
INTRAMUSCULAR | Status: AC
  Filled 2019-07-20: qty 2

## 2019-07-20 MED ORDER — CEFAZOLIN SODIUM-DEXTROSE 2-4 GM/100ML-% IV SOLN: 2.0000 g | INTRAVENOUS | Status: DC

## 2019-07-20 MED ORDER — WARFARIN - PHYSICIAN DOSING INPATIENT: Freq: Every day | Status: DC

## 2019-07-20 MED ORDER — METOCLOPRAMIDE HCL 5 MG PO TABS: 5.0000 mg | ORAL_TABLET | Freq: Three times a day (TID) | ORAL | Status: DC | PRN

## 2019-07-20 SURGICAL SUPPLY — 73 items
AID PSTN UNV HD RSTRNT DISP (MISCELLANEOUS) ×1
BIT DRILL 5/64X5 DISP (BIT) ×3 IMPLANT
BLADE SAG 18X100X1.27 (BLADE) ×1 IMPLANT
BSPLAT GLND +2X24 MDLR (Joint) ×1 IMPLANT
CLOSURE WOUND 1/2 X4 (GAUZE/BANDAGES/DRESSINGS) ×1
COVER SURGICAL LIGHT HANDLE (MISCELLANEOUS) ×3 IMPLANT
COVER WAND RF STERILE (DRAPES) ×1 IMPLANT
CUP SUT UNIV REVERS 36 NEUTRAL (Cup) ×2 IMPLANT
DRAPE IMP U-DRAPE 54X76 (DRAPES) ×6 IMPLANT
DRAPE INCISE IOBAN 66X45 STRL (DRAPES) ×3 IMPLANT
DRAPE ORTHO SPLIT 77X108 STRL (DRAPES) ×6
DRAPE SURG 17X23 STRL (DRAPES) ×3 IMPLANT
DRAPE SURG ORHT 6 SPLT 77X108 (DRAPES) ×2 IMPLANT
DRAPE U-SHAPE 47X51 STRL (DRAPES) ×3 IMPLANT
DRSG AQUACEL AG ADV 3.5X 6 (GAUZE/BANDAGES/DRESSINGS) ×2 IMPLANT
DRSG AQUACEL AG ADV 3.5X10 (GAUZE/BANDAGES/DRESSINGS) ×3 IMPLANT
DURAPREP 26ML APPLICATOR (WOUND CARE) ×3 IMPLANT
ELECT BLADE 4.0 EZ CLEAN MEGAD (MISCELLANEOUS) ×3
ELECT REM PT RETURN 9FT ADLT (ELECTROSURGICAL) ×3
ELECTRODE BLDE 4.0 EZ CLN MEGD (MISCELLANEOUS) ×1 IMPLANT
ELECTRODE REM PT RTRN 9FT ADLT (ELECTROSURGICAL) ×1 IMPLANT
GLENOID UNI REV MOD 24 +2 LAT (Joint) ×2 IMPLANT
GLENOSPHERE 36 +4 LAT/24 (Joint) ×2 IMPLANT
GLOVE BIO SURGEON STRL SZ7.5 (GLOVE) ×3 IMPLANT
GLOVE BIOGEL PI IND STRL 7.5 (GLOVE) IMPLANT
GLOVE BIOGEL PI IND STRL 8 (GLOVE) ×1 IMPLANT
GLOVE BIOGEL PI INDICATOR 7.5 (GLOVE) ×4
GLOVE BIOGEL PI INDICATOR 8 (GLOVE) ×4
GLOVE ECLIPSE 7.0 STRL STRAW (GLOVE) ×4 IMPLANT
GOWN STRL REUS W/ TWL LRG LVL3 (GOWN DISPOSABLE) ×1 IMPLANT
GOWN STRL REUS W/ TWL XL LVL3 (GOWN DISPOSABLE) ×1 IMPLANT
GOWN STRL REUS W/TWL LRG LVL3 (GOWN DISPOSABLE) ×3
GOWN STRL REUS W/TWL XL LVL3 (GOWN DISPOSABLE) ×3
KIT BASIN OR (CUSTOM PROCEDURE TRAY) ×3 IMPLANT
KIT TURNOVER KIT B (KITS) ×3 IMPLANT
LINER HUMERAL 36 +3MM SM (Shoulder) ×2 IMPLANT
MANIFOLD NEPTUNE II (INSTRUMENTS) ×3 IMPLANT
NDL 1/2 CIR MAYO (NEEDLE) ×1 IMPLANT
NDL HYPO 25GX1X1/2 BEV (NEEDLE) ×1 IMPLANT
NDL TAPERED W/ NITINOL LOOP (MISCELLANEOUS) IMPLANT
NEEDLE 1/2 CIR MAYO (NEEDLE) ×3 IMPLANT
NEEDLE HYPO 25GX1X1/2 BEV (NEEDLE) ×3 IMPLANT
NEEDLE TAPERED W/ NITINOL LOOP (MISCELLANEOUS) ×3 IMPLANT
NS IRRIG 1000ML POUR BTL (IV SOLUTION) ×3 IMPLANT
PACK SHOULDER (CUSTOM PROCEDURE TRAY) ×3 IMPLANT
PAD ARMBOARD 7.5X6 YLW CONV (MISCELLANEOUS) ×6 IMPLANT
PIN SET MODULAR GLENOID SYSTEM (PIN) ×2 IMPLANT
RESTRAINT HEAD UNIVERSAL NS (MISCELLANEOUS) ×3 IMPLANT
SCREW CENTRAL MODULAR 25 (Screw) ×2 IMPLANT
SCREW PERI LOCK 5.5X16 (Screw) ×4 IMPLANT
SCREW PERI LOCK 5.5X24 (Screw) ×4 IMPLANT
SLING ARM IMMOBILIZER LRG (SOFTGOODS) ×2 IMPLANT
SLING ARM IMMOBILIZER MED (SOFTGOODS) IMPLANT
SPONGE LAP 18X18 RF (DISPOSABLE) ×2 IMPLANT
SPONGE LAP 4X18 RFD (DISPOSABLE) ×3 IMPLANT
STEM HUMERAL UNI REVERS SZ9 (Stem) ×2 IMPLANT
STRIP CLOSURE SKIN 1/2X4 (GAUZE/BANDAGES/DRESSINGS) ×2 IMPLANT
SUCTION FRAZIER HANDLE 10FR (MISCELLANEOUS) ×3
SUCTION TUBE FRAZIER 10FR DISP (MISCELLANEOUS) ×1 IMPLANT
SUT FIBERWIRE #2 38 T-5 BLUE (SUTURE) ×9
SUT MNCRL AB 4-0 PS2 18 (SUTURE) ×3 IMPLANT
SUT VIC AB 1 CT1 27 (SUTURE)
SUT VIC AB 1 CT1 27XBRD ANBCTR (SUTURE) IMPLANT
SUT VIC AB 2-0 CT1 27 (SUTURE) ×6
SUT VIC AB 2-0 CT1 TAPERPNT 27 (SUTURE) ×1 IMPLANT
SUTURE FIBERWR #2 38 T-5 BLUE (SUTURE) ×1 IMPLANT
SUTURE TAPE 1.3 40 TPR END (SUTURE) IMPLANT
SUTURETAPE 1.3 40 TPR END (SUTURE) ×9
SYR CONTROL 10ML LL (SYRINGE) ×3 IMPLANT
TOWEL GREEN STERILE (TOWEL DISPOSABLE) ×3 IMPLANT
TOWEL GREEN STERILE FF (TOWEL DISPOSABLE) ×3 IMPLANT
WATER STERILE IRR 1000ML POUR (IV SOLUTION) ×3 IMPLANT
YANKAUER SUCT BULB TIP NO VENT (SUCTIONS) ×3 IMPLANT

## 2019-07-20 NOTE — Anesthesia Procedure Notes (Signed)
Anesthesia Regional Block: Interscalene brachial plexus block   Pre-Anesthetic Checklist: ,, timeout performed, Correct Patient, Correct Site, Correct Laterality, Correct Procedure, Correct Position, site marked, Risks and benefits discussed,  Surgical consent,  Pre-op evaluation,  At surgeon's request and post-op pain management  Laterality: Right  Prep: chloraprep       Needles:  Injection technique: Single-shot  Needle Type: Echogenic Stimulator Needle     Needle Length: 9cm  Needle Gauge: 21   Needle insertion depth: 6 cm   Additional Needles:   Procedures:,,,, ultrasound used (permanent image in chart),,,,  Motor weakness within 8 minutes.  Narrative:  Start time: 07/20/2019 1:17 PM End time: 07/20/2019 1:22 PM Injection made incrementally with aspirations every 5 mL.  Performed by: Personally  Anesthesiologist: Josephine Igo, MD  Additional Notes: Timeout performed. Patient sedated. Relevant anatomy ID'd using [place]. Incremental 2-36m injection of LA with frequent aspiration. Patient tolerated procedure well.        Right Interscalene Block

## 2019-07-20 NOTE — ED Notes (Signed)
Patient's family updated on plan of care

## 2019-07-20 NOTE — Anesthesia Preprocedure Evaluation (Addendum)
Anesthesia Evaluation  Patient identified by MRN, date of birth, ID band Patient awake    Reviewed: Allergy & Precautions, NPO status , Patient's Chart, lab work & pertinent test results, reviewed documented beta blocker date and time   Airway Mallampati: II  TM Distance: >3 FB Neck ROM: Full    Dental no notable dental hx. (+) Teeth Intact   Pulmonary sleep apnea and Continuous Positive Airway Pressure Ventilation , pneumonia, resolved, Recent URI , Resolved, former smoker, PE   Pulmonary exam normal        Cardiovascular hypertension, Pt. on medications and Pt. on home beta blockers + Peripheral Vascular Disease and +CHF  + dysrhythmias Atrial Fibrillation  Rhythm:Irregular Rate:Normal  EKG 07/19/19 A. Fib, RAD, Repolarization abn suggestive of ischemia  Echo 07/14/18 1. The left ventricle has normal systolic function, with an ejection fraction of 55-60%. The cavity size was normal. Left ventricular diastolic function could not be evaluated secondary to atrial fibrillation.  2. The right ventricle has normal systolic function. The cavity was normal. There is no increase in right ventricular wall thickness. Right ventricular systolic pressure is normal with an estimated pressure of 27.9 mmHg.  3. Left atrial size was severely dilated.  4. Right atrial size was moderately dilated.  5. Trivial pericardial effusion is present.  6. The aortic root and ascending aorta are normal in size and structure.  7. The inferior vena cava was dilated in size with >50% respiratory variability.    Neuro/Psych  Neuromuscular disease negative psych ROS   GI/Hepatic hiatal hernia, GERD  Medicated,  Endo/Other  Morbid obesityHyperlipidemia Gout  Renal/GU  Bladder dysfunction  Stress incontinence    Musculoskeletal  (+) Arthritis , Osteoarthritis,  Fibromyalgia -OA right shoulder   Abdominal (+) + obese,   Peds  Hematology  (+) anemia  , Coumadin Therapy- PT 20/INR 1.8 this am   Anesthesia Other Findings   Reproductive/Obstetrics                            Anesthesia Physical Anesthesia Plan  ASA: III  Anesthesia Plan: General   Post-op Pain Management:  Regional for Post-op pain   Induction: Intravenous  PONV Risk Score and Plan: 3 and Ondansetron and Treatment may vary due to age or medical condition  Airway Management Planned: Oral ETT  Additional Equipment:   Intra-op Plan:   Post-operative Plan: Extubation in OR  Informed Consent: I have reviewed the patients History and Physical, chart, labs and discussed the procedure including the risks, benefits and alternatives for the proposed anesthesia with the patient or authorized representative who has indicated his/her understanding and acceptance.     Dental advisory given  Plan Discussed with: CRNA and Surgeon  Anesthesia Plan Comments:         Anesthesia Quick Evaluation

## 2019-07-20 NOTE — ED Notes (Signed)
Patient updated on scheduled surgery Time and place.

## 2019-07-20 NOTE — Anesthesia Procedure Notes (Signed)
Procedure Name: Intubation Date/Time: 07/20/2019 2:09 PM Performed by: Trinna Post., CRNA Pre-anesthesia Checklist: Patient identified, Emergency Drugs available, Suction available, Patient being monitored and Timeout performed Patient Re-evaluated:Patient Re-evaluated prior to induction Oxygen Delivery Method: Circle system utilized Preoxygenation: Pre-oxygenation with 100% oxygen Induction Type: IV induction Ventilation: Mask ventilation without difficulty Laryngoscope Size: Mac and 3 Grade View: Grade I Tube type: Oral Tube size: 7.0 mm Number of attempts: 1 Airway Equipment and Method: Stylet Placement Confirmation: ETT inserted through vocal cords under direct vision,  positive ETCO2 and breath sounds checked- equal and bilateral Secured at: 22 cm Tube secured with: Tape Dental Injury: Teeth and Oropharynx as per pre-operative assessment

## 2019-07-20 NOTE — Brief Op Note (Signed)
07/20/2019  4:02 PM  PATIENT:  [name]  85 y.o. female  PRE-OPERATIVE DIAGNOSIS:  End Stage Osteoarthritis  POST-OPERATIVE DIAGNOSIS:  End Stage Osteoarthritis  PROCEDURE:  Procedure(s): REVERSE TOTAL SHOULDER REPLACEMENT (Right)  SURGEON:  Surgeon(s) and Role:    * Nicholes Stairs, MD - Primary  PHYSICIAN ASSISTANT:   ASSISTANTS: Laure Kidney, RNFA   ANESTHESIA:   regional and general  EBL:  500 mL   BLOOD ADMINISTERED:none  DRAINS: none   LOCAL MEDICATIONS USED:  NONE  SPECIMEN:  No Specimen  DISPOSITION OF SPECIMEN:  N/A  COUNTS:  YES  TOURNIQUET:  * No tourniquets in log *  DICTATION: .Dragon Dictation  PLAN OF CARE: Admit to inpatient   PATIENT DISPOSITION:  PACU - hemodynamically stable.   Delay start of Pharmacological VTE agent (>24hrs) due to surgical blood loss or risk of bleeding: not applicable

## 2019-07-20 NOTE — ED Notes (Signed)
Patient's transport scheduled via [facility], [facility] OR confirmed patient going to [facility] Stay and surgery is scheduled for 3 today

## 2019-07-20 NOTE — Progress Notes (Signed)
Orthopedics Progress Note  Subjective: Right shoulder still hurting  Objective:  Vitals:   07/20/19 0400 07/20/19 0500  BP: 136/74 (!) 171/93  Pulse: 84 75  Resp: 17 18  Temp:    SpO2: 92% 91%    General: Awake and alert  Musculoskeletal: Right shoulder swollen and tender. Wiggles fingers Neurovascularly intact  Lab Results  Component Value Date   WBC 9.6 07/19/2019   HGB 11.5 (L) 07/19/2019   HCT 36.8 07/19/2019   MCV 89.3 07/19/2019   PLT 154 07/19/2019       Component Value Date/Time   NA 140 07/19/2019 2022   K 3.4 (L) 07/19/2019 2022   CL 107 07/19/2019 2022   CO2 24 07/19/2019 2022   GLUCOSE 126 (H) 07/19/2019 2022   BUN 12 07/19/2019 2022   CREATININE 0.54 07/19/2019 2022   CREATININE 0.90 (H) 04/22/2017 1603   CALCIUM 8.4 (L) 07/19/2019 2022   GFRNONAA >60 07/19/2019 2022   GFRNONAA 60 04/22/2017 1603   GFRAA >60 07/19/2019 2022   GFRAA 70 04/22/2017 1603    Lab Results  Component Value Date   INR 1.8 (H) 07/20/2019   INR 1.9 (H) 07/19/2019   INR 1.8 (A) 07/14/2019    Assessment/Plan: POD #1 s/p Procedure(s): REVERSE TOTAL SHOULDER REPLACEMENT INR still up but should be better later. May give Vit K around noon.  Planning surgery at [facility] later this afternoon  Doran Heater. Veverly Fells, MD 07/20/2019 7:50 AM

## 2019-07-20 NOTE — Op Note (Signed)
Date: 07/20/2019   PRE-OPERATIVE DIAGNOSIS:  Right proximal humerus fracture   POST-OPERATIVE DIAGNOSIS:  Same   PROCEDURE:  1. REVERSE SHOULDER ARTHROPLASTY   SURGEON:  Nicholes Stairs, MD   ASSISTANT: Laure Kidney, RNFA   ANESTHESIA:   General with a block   ESTIMATED BLOOD LOSS: See anesthesia record   PREOPERATIVE INDICATIONS:  [name]  is a right handed female who sustained a right proximal humerus fracture following a fall.  Due to the comminution and displaced nature of the fracture and pre-existing osteoarthritis,  We discussed moving forward with reverse shoulder arthroplasty for his injury to allow earlier weight bearing on a cane as needed and lower risk of AVN, non union or progression of shoulder arthritis following the fracture. Thus we elected to proceed with reverse shoulder arthroplasty for the plan.  The risks benefits and alternatives were discussed with the patient preoperatively including but not limited to the risks of infection, bleeding, nerve injury, cardiopulmonary complications, the need for revision surgery, dislocation, brachial plexus palsy, incomplete relief of pain, among others, and the patient was willing to proceed. The patient did provided informed consent.   OPERATIVE IMPLANTS:  Arthrex size 9 humeral stem 36+ 4 mm glenosphere 24 + 2 mm glenoid baseplate Standard humeral tray 36+3 humeral insert 25 mm central compression screw   OPERATIVE FINDINGS:   There was a very arthritic glenohumeral joint with complete fracture through the surgical neck.  There was a nondisplaced fracture through the greater tuberosity.  There was a full-thickness tear of the anterior aspect of the supraspinatus.  Throughout the case we did encounter more bleeding than we typically would do to [name] prior use of Coumadin.  Most of this was cancellous bleeding from the bone.  Soft tissues were reasonably dry throughout the case.   OPERATIVE PROCEDURE: The patient was  brought to the operating room and placed in the supine position. General anesthesia was administered. IV antibiotics were given. Time out was performed. The upper extremity was prepped and draped in usual sterile fashion. The patient was in a beachchair position. Deltopectoral approach was carried out. After dissection through skin and subcutaneous fat, the cephalic vein was identified with the deltopectoral interval.  This was mobilized and taken medially.   The fracture was identified and working through the fracture in the biceps groove the lesser and greater tuberosities were freed up and tagged with #2 Fiber wire sutures medially, and suture tapes were used laterally and superiorly for the greater tuberosity fragments. The humeral head was fragmented and removed from the wound.     Next, the long head of the biceps tendon was tenotomized.    I then performed circumferential releases of the humerus.  I then moved to sizing the humerus.  There was minimal calcar bone loss and this was used to reference the height of the stem, as was the upper border of the pectoralis major tendon.  The canal was reamed and found to fit best with a 9 mm fracture stem.   We next turned to the glenoid.  Deep retractors were placed, and I resected the labrum as well as the residual long head of biceps, and then placed a guidepin into the center position on the glenoid, with slight inferior declination. I then reamed over the guidepin, and this created a small metaphyseal cancellus blush inferiorly, removing just the cartilage to the subchondral bone superiorly. The base plate was selected and impacted place, and then I secured it centrally with  a nonlocking screw, and I had excellent purchase both inferiorly and superiorly. I placed a short locking screws on anterior aspect,  And posterior aspect.   I then turned my attention to the glenosphere, and impacted this into place.   The glenophere was completely seated, and had  engagement of the [facility] taper. I then turned my attention back to the humerus.    The 9 mm stem was seated to the appropriate height to allow approximate 5.6 cm from the top of the Pectoralis major tendon to the top of the glenosphere.  The stem was placed in 30 degrees of retroversion.   Once the stem was impacted into place we trialed poly liners.  Using the standard humeral metaglen,  The shoulder had excellent motion, and was stable.  The final poly was impacted and again showed good motion and stability.  Next, I irrigated the wounds copiously.  Vancomycin powder was placed into the humeral canal prior to impacting the stem.   The greater tuberosity was brought back to the humeral stem suture holes and secured with bone graft from the humeral head as augment.  The lesser tuberosity was unfortunately not able to be brought back to the stem due to lateralization from the +6 glenosphere, but the deltoid was noted to have excellent tension.  The axillary nerve was palpated at the end of implanting, and found to be in continuity and not under undo tension.   I then irrigated the shoulder copiously once more, repaired the deltopectoral interval with Vicryl followed by subcutaneous monocryl and then subcuticular monocryl with Steri-Strips and sterile gauze for the skin. The patient was awakened and returned back in stable and satisfactory condition. There no complications and he tolerated the procedure well.  All counts were correct.    The patient awakened from general anesthesia with no complications and transferred to PACU in stable condition.   Postoperative Plan: [name] will remain in his sling until [name] regional block has worn off.  She will be none weightbearing to the arm and less using [name] cane or walker for assistive device.  We will admit [name] to the orthopedic floor postoperatively for routine postoperative care including occupational and physical therapy.  She will be in [name] sling as needed  for the first 2 weeks.

## 2019-07-20 NOTE — Transfer of Care (Signed)
Immediate Anesthesia Transfer of Care Note  Patient: [name]  Procedure(s) Performed: REVERSE TOTAL SHOULDER REPLACEMENT (Right Shoulder)  Patient Location: PACU  Anesthesia Type:General  Level of Consciousness: drowsy  Airway & Oxygen Therapy: Patient Spontanous Breathing and Patient connected to face mask oxygen  Post-op Assessment: Report given to RN and Post -op Vital signs reviewed and stable  Post vital signs: Reviewed and stable  Last Vitals:  Vitals Value Taken Time  BP 110/47 07/20/19 1615  Temp 36.6 C 07/20/19 1615  Pulse 89 07/20/19 1625  Resp 16 07/20/19 1626  SpO2 99 % 07/20/19 1625  Vitals shown include unvalidated device data.  Last Pain:  Vitals:   07/20/19 1615  TempSrc:   PainSc: 0-No pain      Patients Stated Pain Goal: 3 (92/78/00 4471)  Complications: No apparent anesthesia complications

## 2019-07-20 NOTE — H&P (Signed)
H&P update  The surgical history has been reviewed and remains accurate without interval change.  The patient was re-examined and patient's physiologic condition has not changed significantly in the last 30 days. The condition still exists that makes this procedure necessary. The treatment plan remains the same, without new options for care.  No new pharmacological allergies or types of therapy has been initiated that would change the plan or the appropriateness of the plan.  The patient and/or family understand the potential benefits and risks.  I am assuming care from Dr. Veverly Fells who has been directing her care since last night.  Plan for admission post op for routine post op care, pain control, OT /PT.  Likely discharge home after inpatient course.  [name] P. Stann Mainland, MD 07/20/2019 12:39 PM

## 2019-07-21 ENCOUNTER — Encounter: Payer: Self-pay | Admitting: *Deleted

## 2019-07-21 LAB — CBC
HCT: 30.8 % — ABNORMAL LOW (ref 36.0–46.0)
Hemoglobin: 9.3 g/dL — ABNORMAL LOW (ref 12.0–15.0)
MCH: 28 pg (ref 26.0–34.0)
MCHC: 30.2 g/dL (ref 30.0–36.0)
MCV: 92.8 fL (ref 80.0–100.0)
Platelets: 141 10*3/uL — ABNORMAL LOW (ref 150–400)
RBC: 3.32 MIL/uL — ABNORMAL LOW (ref 3.87–5.11)
RDW: 16.4 % — ABNORMAL HIGH (ref 11.5–15.5)
WBC: 9.3 10*3/uL (ref 4.0–10.5)
nRBC: 0 % (ref 0.0–0.2)

## 2019-07-21 MED ORDER — LOPERAMIDE HCL 2 MG PO CAPS
2.0000 mg | ORAL_CAPSULE | ORAL | Status: DC | PRN
  Administered 2019-07-21: 2 mg via ORAL
  Filled 2019-07-21: qty 1

## 2019-07-21 NOTE — NC FL2 (Signed)
[place] MEDICAID FL2 LEVEL OF CARE SCREENING TOOL     IDENTIFICATION  Patient Name: [name] Birthdate: 30-Mar-1934 Sex: female Admission Date (Current Location): 07/19/2019  [facility] and [place] Number:  Herbalist and Address:  [facility] [place] [address] [place], [place] [address]      Provider Number: [id number]  Attending Physician Name and Address:  Nicholes Stairs, MD  Relative Name and Phone Number:  Amala Petion - spouse - [phone]    Current Level of Care: Hospital Recommended Level of Care: [place] Prior Approval Number:    Date Approved/Denied:   PASRR Number: [id number]  Discharge Plan: SNF    Current Diagnoses: Patient Active Problem List   Diagnosis Date Noted  . Closed 2-part displaced fracture of surgical neck of right humerus 07/19/2019  . CHF (congestive heart failure) ([place]) 12/27/2018  . CAP (community acquired pneumonia) 12/27/2018  . Acute on chronic diastolic (congestive) heart failure ([place]) 07/14/2018  . Acute on chronic diastolic heart failure ([place]) 07/13/2018  . Lumbosacral spondylosis without myelopathy 09/26/2017  . Chronic low back pain 07/21/2017  . Degeneration of lumbar intervertebral disc 04/21/2017  . Lumbar post-laminectomy syndrome 04/21/2017  . Esophageal dysphagia 10/15/2016  . Gastroesophageal reflux disease without esophagitis 10/15/2016  . Seasonal allergic rhinitis 10/15/2016  . Chronic atrial fibrillation ([place]) 05/15/2015  . Aftercare following surgery of the circulatory system, [place] 09/15/2013  . Essential hypertension 06/17/2013  . Atrial fibrillation with RVR ([place]) 04/01/2013  . Morbid obesity ([place]) 04/01/2013  . OSA on CPAP 04/01/2013  . Chronic diastolic CHF (congestive heart failure) ([place])   . H/o recurrent pulmonary embolism, on Coumadin 05/24/2012  . Acute on chronic diastolic congestive heart failure ([place]) 05/24/2012  . Normocytic anemia  05/24/2012  . Thrombocytopenia ([place]) 05/24/2012  . Inadequate anticoagulation 05/24/2012  . Elevated brain natriuretic peptide (BNP) level 05/24/2012  . Shoulder arthritis 05/15/2012  . Stress incontinence 01/06/2012  . Chronic cystitis 01/06/2012  . Occlusion and stenosis of carotid artery without mention of cerebral infarction 09/10/2011  . OA (osteoarthritis) of hip 06/17/2011  . Osteoarthritis 01/08/2011  . Fibromyalgia 08/06/2010  . Dyslipidemia 08/06/2010  . Gout 08/06/2010  . Right carotid artery occlusion 08/06/2010    Orientation RESPIRATION BLADDER Height & Weight     Self, Time, Situation, Place  Normal Continent Weight: 87.1 kg Height:  5' 2"  (157.5 cm)  BEHAVIORAL SYMPTOMS/MOOD NEUROLOGICAL BOWEL NUTRITION STATUS      Continent Diet(See discharge summary)  AMBULATORY STATUS COMMUNICATION OF NEEDS Skin   Limited Assist Verbally Surgical wounds                       Personal Care Assistance Level of Assistance  Bathing, Feeding, Dressing Bathing Assistance: Limited assistance Feeding assistance: Independent Dressing Assistance: Limited assistance     Functional Limitations Info  Sight, Hearing, Speech Sight Info: Adequate Hearing Info: Adequate Speech Info: Adequate    SPECIAL CARE FACTORS FREQUENCY  PT (By licensed PT), OT (By licensed OT)     PT Frequency: 5 x per week OT Frequency: 5 x per week            Contractures Contractures Info: Not present    Additional Factors Info  Code Status, Allergies Code Status Info: Full Allergies Info: codeine, cymbalta, diltiazem, duloxetine, metoprolol, nortriptyline, PCN, statins, tetanus, cipro, lyrica           Current Medications (07/21/2019):  This is the  current hospital active medication list Current Facility-Administered Medications  Medication Dose Route Frequency Provider Last Rate Last Admin  . acetaminophen (TYLENOL) tablet 325-650 mg  325-650 mg Oral Q6H PRN Nicholes Stairs, MD       . acetaminophen (TYLENOL) tablet 500 mg  500 mg Oral Q6H Nicholes Stairs, MD   500 mg at 07/21/19 1102  . allopurinol (ZYLOPRIM) tablet 300 mg  300 mg Oral Daily Nicholes Stairs, MD   300 mg at 07/21/19 0900  . amLODipine (NORVASC) tablet 5 mg  5 mg Oral Daily Nicholes Stairs, MD   5 mg at 07/21/19 0905  . brimonidine (ALPHAGAN) 0.2 % ophthalmic solution 1 drop  1 drop Right Eye BID Nicholes Stairs, MD   1 drop at 07/21/19 1105  . docusate sodium (COLACE) capsule 100 mg  100 mg Oral BID Nicholes Stairs, MD   100 mg at 07/20/19 2236  . dorzolamide-timolol (COSOPT) 22.3-6.8 MG/ML ophthalmic solution 1 drop  1 drop Both Eyes BID Nicholes Stairs, MD   1 drop at 07/21/19 [phone]  . famotidine (PEPCID) tablet 20 mg  20 mg Oral BID Nicholes Stairs, MD   20 mg at 07/21/19 0859  . furosemide (LASIX) tablet 20 mg  20 mg Oral Daily Nicholes Stairs, MD   20 mg at 07/21/19 0858  . HYDROcodone-acetaminophen (NORCO) 7.5-325 MG per tablet 1-2 tablet  1-2 tablet Oral Q4H PRN Nicholes Stairs, MD      . HYDROcodone-acetaminophen (NORCO/VICODIN) 5-325 MG per tablet 0.5-1 tablet  0.5-1 tablet Oral TID PRN Nicholes Stairs, MD   1 tablet at 07/20/19 1002  . HYDROcodone-acetaminophen (NORCO/VICODIN) 5-325 MG per tablet 1-2 tablet  1-2 tablet Oral Q4H PRN Nicholes Stairs, MD   1 tablet at 07/21/19 1221  . ipratropium (ATROVENT) 0.06 % nasal spray 1 spray  1 spray Each Nare Daily PRN Nicholes Stairs, MD      . latanoprost (XALATAN) 0.005 % ophthalmic solution 1 drop  1 drop Both Eyes QHS Nicholes Stairs, MD   1 drop at 07/20/19 2208  . loratadine (CLARITIN) tablet 10 mg  10 mg Oral Daily Nicholes Stairs, MD   10 mg at 07/21/19 0859  . menthol-cetylpyridinium (CEPACOL) lozenge 3 mg  1 lozenge Oral PRN Nicholes Stairs, MD       Or  . phenol [facility]) mouth spray 1 spray  1 spray Mouth/Throat PRN Nicholes Stairs, MD      .  metoCLOPramide [facility]) tablet 5-10 mg  5-10 mg Oral Q8H PRN Nicholes Stairs, MD       Or  . metoCLOPramide [facility]) injection 5-10 mg  5-10 mg Intravenous Q8H PRN Nicholes Stairs, MD      . mirtazapine (REMERON) tablet 7.5 mg  7.5 mg Oral QHS Nicholes Stairs, MD   7.5 mg at 07/20/19 2202  . morphine 2 MG/ML injection 0.5-1 mg  0.5-1 mg Intravenous Q2H PRN Nicholes Stairs, MD      . morphine 2 MG/ML injection 2 mg  2 mg Intravenous Q2H PRN Nicholes Stairs, MD   2 mg at 07/20/19 1125  . mupirocin ointment (BACTROBAN) 2 % 1 application  1 application Topical BID Nicholes Stairs, MD   1 application at 97/35/32 2237  . nebivolol (BYSTOLIC) tablet 10 mg  10 mg Oral QHS Nicholes Stairs, MD   10 mg at 07/20/19 2209  . Netarsudil Dimesylate 0.02 %  SOLN 1 drop  1 drop Right Eye QHS Nicholes Stairs, MD      . ondansetron [facility]) injection 4 mg  4 mg Intravenous Q6H PRN Nicholes Stairs, MD   4 mg at 07/20/19 1124  . ondansetron (ZOFRAN) tablet 4 mg  4 mg Oral Q6H PRN Nicholes Stairs, MD       Or  . ondansetron [facility]) injection 4 mg  4 mg Intravenous Q6H PRN Nicholes Stairs, MD      . potassium chloride SA (KLOR-CON) CR tablet 20 mEq  20 mEq Oral Daily Nicholes Stairs, MD   20 mEq at 07/21/19 0859  . silver sulfADIAZINE (SILVADENE) 1 % cream 1 application  1 application Topical Daily Nicholes Stairs, MD   1 application at 51/70/01 0914  . traMADol (ULTRAM) tablet 50 mg  50 mg Oral Q6H Nicholes Stairs, MD   50 mg at 07/20/19 1856  . triamcinolone (NASACORT) nasal inhaler 2 spray  2 spray Nasal Daily PRN Nicholes Stairs, MD      . warfarin (COUMADIN) tablet 2.5 mg  2.5 mg Oral Once per day on Sun Tue Wed Thu Sat Nicholes Stairs, MD   2.5 mg at 07/20/19 2203  . [START ON 07/23/2019] warfarin (COUMADIN) tablet 3.75 mg  3.75 mg Oral Once per day on Mon Fri Nicholes Stairs, MD      . Warfarin - Physician Dosing  Inpatient   Does not apply q1600 Nicholes Stairs, MD         Discharge Medications: Please see discharge summary for a list of discharge medications.  Relevant Imaging Results:  Relevant Lab Results:   Additional Information SS # [id number]  Curlene Labrum, RN

## 2019-07-21 NOTE — Progress Notes (Signed)
° °  Subjective:  Patient reports pain as mild.  Doing much better this afternoon.  Block still effective.  Worked with PT/OT.  Still some SOB, but no CP.  Had some loose stools  Objective:   VITALS:   Vitals:   07/20/19 1900 07/21/19 0259 07/21/19 0902 07/21/19 1546  BP: (!) 100/49 (!) 119/51 (!) 151/64 (!) 96/32  Pulse: 90 85 (!) 102 89  Resp: 18 17 19 20   Temp: 98.1 F (36.7 C) 98.1 F (36.7 C) 98.5 F (36.9 C) 97.8 F (36.6 C)  TempSrc: Oral Oral    SpO2: 93% 93% 95% 93%  Weight:      Height:        Neurologically intact Neurovascular intact Sensation intact distally Intact pulses distally Incision: dressing C/D/I Sling in place  Lab Results  Component Value Date   WBC 9.3 07/21/2019   HGB 9.3 (L) 07/21/2019   HCT 30.8 (L) 07/21/2019   MCV 92.8 07/21/2019   PLT 141 (L) 07/21/2019   BMET    Component Value Date/Time   NA 142 07/20/2019 1533   K 3.7 07/20/2019 1533   CL 104 07/20/2019 1533   CO2 24 07/19/2019 2022   GLUCOSE 115 (H) 07/20/2019 1533   BUN 8 07/20/2019 1533   CREATININE 0.60 07/20/2019 1533   CREATININE 0.90 (H) 04/22/2017 1603   CALCIUM 8.4 (L) 07/19/2019 2022   GFRNONAA >60 07/19/2019 2022   GFRNONAA 60 04/22/2017 1603   GFRAA >60 07/19/2019 2022   GFRAA 70 04/22/2017 1603     Assessment/Plan: 1 Day Post-Op   Active Problems:   Closed 2-part displaced fracture of surgical neck of right humerus   Up with therapy - aggressive IS, with supplemental oxygen weaned when able  - ok to use Right arm from cane/walker, otherwise no WB to RUE, with sling   - will change dressing to aquacel before dc  - will follow up progress with PT/OT.  SNF vs. HHPT ( I have spoken to [facility], who have agreed to see and treat if needed.)   Nicholes Stairs 07/21/2019, 4:36 PM   Geralynn Rile, MD [phone]

## 2019-07-21 NOTE — Evaluation (Signed)
Physical Therapy Evaluation Patient Details Name: RENNEE Stanley MRN: [id number] DOB: [birth year] Today's Date: 07/21/2019   History of Present Illness  Pt is a 85 y.o. F with significant PMH of atrial fibrillation, CHF, fibromyalgia, HTN, PE, back surgery, right femur fracture surgery, bilateral hip replacement, right knee replacement, and L TSA. Pt presents with a fall and R proximal humerus fracture. Now s/p right reverse TSA 07/20/2019.   Clinical Impression  Pt admitted s/p procedure listed above. Prior to admission, pt using a cane for mobility and requires assist for some ADL's. On PT evaluation, pt presents with generalized weakness, balance impairments, decreased endurance, and decreased functional use of right upper extremity. Requiring minimal assist for basic transfers onto a bedside commode. Requiring totalA for peri care. Further ambulation limited due to pt fatigue. SpO2 86-91% on 2L; bumped up to 2.5 L to maintain > 90% consistently. HR 111-124 bpm. Pt presents at high risk of falls based on deficits listed above and history of falling. Recommending short term rehab to address deficits, maximize functional mobility, and decrease caregiver burden.     Follow Up Recommendations SNF;Supervision/Assistance - 24 hour (refusing currently; will need HHPT/OT)   Equipment Recommendations  3in1 (PT)    Recommendations for Other Services       Precautions / Restrictions Precautions Precautions: Fall;Shoulder Type of Shoulder Precautions: AROM EWH, OK for pendulums, Forward flexion to 90, no abduction, ER to O okay, no AROM shoulder Shoulder Interventions: Shoulder sling/immobilizer;Off for dressing/bathing/exercises Restrictions Weight Bearing Restrictions: Yes RUE Weight Bearing: Non weight bearing Other Position/Activity Restrictions: Ok to [employer] for cane or walker use      Mobility  Bed Mobility               General bed mobility comments: OOB in chair  Transfers Overall  transfer level: Needs assistance Equipment used: Rolling walker (2 wheeled) Transfers: Sit to/from [facility] Sit to Stand: Min assist Stand pivot transfers: Min assist       General transfer comment: MinA to boost up and initially steady from recliner and pivot to and from [facility]. Cues for hand placement, sequencing/direction. Fatigues quickly.  Ambulation/Gait             General Gait Details: deferred  Stairs            Wheelchair Mobility    Modified Rankin (Stroke Patients Only)       Balance Overall balance assessment: Needs assistance Sitting-balance support: Feet supported Sitting balance-Leahy Scale: Fair     Standing balance support: Bilateral upper extremity supported Standing balance-Leahy Scale: Poor Standing balance comment: reliant on at least single UE support                             Pertinent Vitals/Pain      Home Living Family/patient expects to be discharged to:: Private residence Living Arrangements: Spouse/significant other Available Help at Discharge: Family;Available 24 hours/day Type of Home: House Home Access: Stairs to enter Entrance Stairs-Rails: Left Entrance Stairs-Number of Steps: 4 Home Layout: One level Home Equipment: Walker - 2 wheels;Walker - 4 wheels;Grab bars - tub/shower;Grab bars - toilet;Cane - single point      Prior Function Level of Independence: Needs assistance   Gait / Transfers Assistance Needed: Uses cane, requires assist for stair negotiation, supervision for tub transfer  ADL's / Homemaking Assistance Needed: Assist for LB dressing, "drying," when she comes out of the shower  Hand Dominance   Dominant Hand: Right    Extremity/Trunk Assessment   Upper Extremity Assessment Upper Extremity Assessment: Defer to OT evaluation    Lower Extremity Assessment Lower Extremity Assessment: Generalized weakness    Cervical / Trunk Assessment Cervical / Trunk  Assessment: Other exceptions Cervical / Trunk Exceptions: increased body habitus  Communication   Communication: No difficulties  Cognition Arousal/Alertness: Awake/alert Behavior During Therapy: WFL for tasks assessed/performed Overall Cognitive Status: Impaired/Different from baseline Area of Impairment: Safety/judgement;Awareness;Problem solving                         Safety/Judgement: Decreased awareness of deficits Awareness: Emergent Problem Solving: Difficulty sequencing;Requires verbal cues General Comments: A&Ox4 (although not oriented to day of week). Still with some confusion, i.e. stating "don't let me sit on my coat," when transferring to the chair although no coat was in sight. Some emerging awareness of deficits i.e. stating "I'm not going to be able to wipe at home with my left arm."       General Comments      Exercises     Assessment/Plan    PT Assessment Patient needs continued PT services  PT Problem List Decreased strength;Decreased range of motion;Decreased activity tolerance;Decreased balance;Decreased mobility;Decreased cognition;Pain;Cardiopulmonary status limiting activity       PT Treatment Interventions DME instruction;Functional mobility training;Therapeutic activities;Therapeutic exercise;Balance training;Patient/family education;Stair training;Gait training    PT Goals (Current goals can be found in the Care Plan section)  Acute Rehab PT Goals Patient Stated Goal: go home PT Goal Formulation: With patient Time For Goal Achievement: 08/04/19 Potential to Achieve Goals: Good    Frequency Min 5X/week   Barriers to discharge        Co-evaluation               AM-PAC PT "6 Clicks" Mobility  Outcome Measure Help needed turning from your back to your side while in a flat bed without using bedrails?: A Little Help needed moving from lying on your back to sitting on the side of a flat bed without using bedrails?: A Lot Help  needed moving to and from a bed to a chair (including a wheelchair)?: A Little Help needed standing up from a chair using your arms (e.g., wheelchair or bedside chair)?: A Little Help needed to walk in hospital room?: A Little Help needed climbing 3-5 steps with a railing? : A Lot 6 Click Score: 16    End of Session Equipment Utilized During Treatment: Gait belt Activity Tolerance: Patient limited by fatigue Patient left: in chair;with call bell/phone within reach;with chair alarm set Nurse Communication: Mobility status;Other (comment)(vitals) PT Visit Diagnosis: Pain;Difficulty in walking, not elsewhere classified (R26.2);Muscle weakness (generalized) (M62.81);History of falling (Z91.81) Pain - Right/Left: Right Pain - part of body: Shoulder(back)    Time: 1740-8144 PT Time Calculation (min) (ACUTE ONLY): 20 min   Charges:   PT Evaluation $PT Eval Moderate Complexity: 1 Mod            Wyona Almas, PT, DPT Acute Rehabilitation Services Pager [phone] Office [phone]   Deno Etienne 07/21/2019, 11:48 AM

## 2019-07-21 NOTE — Progress Notes (Signed)
Nutrition Brief Note  Patient identified on the Malnutrition Screening Tool (MST) Report  Wt Readings from Last 15 Encounters:  07/20/19 87.1 kg  04/19/19 91.6 kg  01/12/19 90.7 kg  12/31/18 91.6 kg  12/16/18 92.1 kg  09/08/18 91.6 kg  07/30/18 93 kg  07/15/18 93.1 kg  05/22/18 100.2 kg  11/24/17 99.8 kg  11/11/17 99.3 kg  08/07/17 100.4 kg  05/27/17 102.2 kg  05/06/17 100.7 kg  11/26/16 101.2 kg   85 yo female with a history of chronic shoulder arthritis that reports severe pain and loss of remaining function after a twisting fall today.  Pt admitted with rt humerus fx.   6/8- s/p Procedure(s): REVERSE TOTAL SHOULDER REPLACEMENT (Right)  Reviewed I/O's: +2 L x 24 hours  Spoke with, who was sitting in recliner chair at time of visit. She consumed all of her breakfast this morning. Pt shares she has a fair appetite PTA. She usually consumes 2 large meals and a snack daily (Breakfast: bacon, eggs, and toast; Lunch: yogurt and fruit; Dinner: meat, starch, and vegetables). Pt explains that she eats out a lot as it is just her and her husband and they do not do much cooking. Pt reports her mobility has worsened over time- she walks with a cane, but tends to "shuffle around" a lot more.   Pt reports that she had lost about 8 pounds a few months ago secondary to diarrhea, but has regained the weight back. Reviewed wt hx; pt has experienced a 4% wt loss over the past 6 months, which is not significant for time frame.   Nutrition-Focused physical exam completed. Findings are no fat depletion, no muscle depletion, and no edema.   Medications reviewed and include colace, pepcid, lasix, remeron, and ancef.   Discussed importance of good meal intake to promote healing. RD personally ordered lunch and dinner meals for pt.   Labs reviewed.   Current diet order is regular, patient is consuming approximately 100% of meals at this time. Labs and medications reviewed.   No nutrition  interventions warranted at this time. If nutrition issues arise, please consult RD.   Loistine Chance, RD, LDN, [place] Registered Dietitian II Certified Diabetes Care and Education Specialist Please refer to [facility] for RD and/or RD on-call/weekend/after hours pager

## 2019-07-21 NOTE — Evaluation (Signed)
Occupational Therapy Evaluation Patient Details Name: [name] MRN: [id number] DOB: [birth year] Today's Date: 07/21/2019    History of Present Illness Pt is a 85 y.o. F with significant PMH of atrial fibrillation, CHF, fibromyalgia, HTN, PE, back surgery, right femur fracture surgery, bilateral hip replacement, right knee replacement, and L TSA. Pt presents with a fall and R proximal humerus fracture. Now s/p right reverse TSA 07/20/2019.    Clinical Impression   Pt was using a cane for ambulation and was assisted for LB dressing, tub transfer and IADL prior to admission. She lives with her supportive husband who is also of advanced age. Pt currently requires min to total assist for ADL and ambulated with min hand held assist this visit. She needed assist to rise into standing as she uses B UEs to stand at baseline. Pt reports she has 02 at home, but just rushes through what she needs to do and then gets back to the couch where she replaces her 02.  Educated pt and husband in compensatory strategies for ADL, sling use, exercises as ordered. Recommend ST rehab in SNF prior to return home, pt is considering, but would prefer to go home as long as her husband can assist her. Will follow acutely.  Follow Up Recommendations  SNF;Supervision/Assistance - 24 hour(HHOT if pt refuses SNF)    Equipment Recommendations       Recommendations for Other Services       Precautions / Restrictions Precautions Precautions: Fall;Shoulder Type of Shoulder Precautions: AROM EWH, OK for pendulums, Forward flexion to 90, no abduction, ER to O okay, no AROM shoulder Shoulder Interventions: Shoulder sling/immobilizer;Off for dressing/bathing/exercises Precaution Booklet Issued: Yes (comment) Required Braces or Orthoses: Sling Restrictions Weight Bearing Restrictions: Yes RUE Weight Bearing: Non weight bearing Other Position/Activity Restrictions: Ok to WB for cane or walker use      Mobility Bed Mobility                General bed mobility comments: OOB in chair  Transfers Overall transfer level: Needs assistance Equipment used: 1 person hand held assist Transfers: Sit to/from Stand Sit to Stand: Min assist Stand pivot transfers: Min assist       General transfer comment: min assist to rise and steady, per husband, he assists her to stand at home at times    Balance Overall balance assessment: Needs assistance Sitting-balance support: Feet supported Sitting balance-Leahy Scale: Fair     Standing balance support: Single extremity supported Standing balance-Leahy Scale: Poor Standing balance comment: reliant on at least single UE support                           ADL either performed or assessed with clinical judgement   ADL Overall ADL's : Needs assistance/impaired Eating/Feeding: Minimal assistance;Sitting   Grooming: Minimal assistance;Sitting;Wash/dry hands;Wash/dry face   Upper Body Bathing: Moderate assistance;Sitting   Lower Body Bathing: Maximal assistance;Sit to/from stand   Upper Body Dressing : Moderate assistance;Sitting   Lower Body Dressing: Maximal assistance;Sit to/from stand   Toilet Transfer: Minimal assistance;Ambulation   Toileting- Clothing Manipulation and Hygiene: Total assistance;Sit to/from stand       Functional mobility during ADLs: Minimal assistance(hand held) General ADL Comments: Educated pt and husband in sling use and wearing schedule, position R UE in bed and chair and compensatory strategies for ADL.     Vision Patient Visual Report: No change from baseline       Perception  Praxis      Pertinent Vitals/Pain Pain Assessment: Faces Faces Pain Scale: Hurts even more Pain Location: R shoulder with PROM Pain Descriptors / Indicators: Aching;Grimacing;Guarding Pain Intervention(s): Limited activity within patient's tolerance;Monitored during session;Repositioned;Premedicated before session     Hand  Dominance Right   Extremity/Trunk Assessment Upper Extremity Assessment Upper Extremity Assessment: LUE deficits/detail;RUE deficits/detail RUE Deficits / Details: tolerated PROM to 45 degrees, ER to neutral, full PROM elbow to wrist, able to open and close fingers actively RUE Coordination: decreased gross motor LUE Deficits / Details: full AROM, hx of TSA   Lower Extremity Assessment Lower Extremity Assessment: Defer to PT evaluation   Cervical / Trunk Assessment Cervical / Trunk Assessment: Other exceptions Cervical / Trunk Exceptions: increased body habitus   Communication Communication Communication: HOH   Cognition Arousal/Alertness: Awake/alert Behavior During Therapy: WFL for tasks assessed/performed Overall Cognitive Status: Impaired/Different from baseline Area of Impairment: Safety/judgement;Awareness;Problem solving                         Safety/Judgement: Decreased awareness of deficits Awareness: Emergent Problem Solving: Difficulty sequencing;Requires verbal cues General Comments: decreased insight into the level of care she requires and family's ability to assist   General Comments       Exercises     Shoulder Instructions      Home Living Family/patient expects to be discharged to:: Private residence Living Arrangements: Spouse/significant other Available Help at Discharge: Family;Available 24 hours/day Type of Home: House Home Access: Stairs to enter [employer] of Steps: 4 Entrance Stairs-Rails: Left Home Layout: One level     Bathroom Shower/Tub: Teacher, early years/pre: Standard     Home Equipment: Environmental consultant - 2 wheels;Walker - 4 wheels;Grab bars - tub/shower;Grab bars - toilet;Cane - single point;Shower seat          Prior Functioning/Environment Level of Independence: Needs assistance  Gait / Transfers Assistance Needed: Uses cane, requires assist for stair negotiation, supervision for tub transfer ADL's /  Homemaking Assistance Needed: Assist for LB dressing, "drying," when she comes out of the shower, husband does much of the IADL and they have a housekeeper            OT Problem List: Decreased strength;Decreased activity tolerance;Impaired balance (sitting and/or standing);Decreased coordination;Decreased cognition;Decreased safety awareness;Decreased knowledge of use of DME or AE;Obesity;Pain;Impaired UE functional use      OT Treatment/Interventions: Self-care/ADL training;DME and/or AE instruction;Therapeutic activities;Patient/family education;Balance training;Therapeutic exercise    OT Goals(Current goals can be found in the care plan section) Acute Rehab OT Goals Patient Stated Goal: go home OT Goal Formulation: With patient Time For Goal Achievement: 08/04/19 Potential to Achieve Goals: Good ADL Goals Pt Will Perform Grooming: with supervision;standing Pt Will Transfer to Toilet: with supervision;ambulating;bedside commode Pt Will Perform Toileting - Clothing Manipulation and hygiene: with min assist;sit to/from stand Pt/caregiver will Perform Home Exercise Program: Right Upper extremity;Independently;With written HEP provided Additional ADL Goal #1: Pt and husband will be independent in sling use. Additional ADL Goal #2: Pt will complete basic ADL adhering to shoulder precautions with husband's assistance.  OT Frequency: Min 2X/week   Barriers to D/C:            Co-evaluation              AM-PAC OT "6 Clicks" Daily Activity     Outcome Measure Help from another person eating meals?: A Little Help from another person taking care of personal grooming?: A Little  Help from another person toileting, which includes using toliet, bedpan, or urinal?: Total Help from another person bathing (including washing, rinsing, drying)?: A Lot Help from another person to put on and taking off regular upper body clothing?: A Lot Help from another person to put on and taking off  regular lower body clothing?: Total 6 Click Score: 12   End of Session Equipment Utilized During Treatment: Gait belt;Other (comment)(sling)  Activity Tolerance: Patient tolerated treatment well Patient left: in chair;with call bell/phone within reach;with chair alarm set;with family/visitor present  OT Visit Diagnosis: Unsteadiness on feet (R26.81);Pain;Muscle weakness (generalized) (M62.81);History of falling (Z91.81);Other symptoms and signs involving cognitive function                Time: 1130-1155 OT Time Calculation (min): 25 min Charges:  OT General Charges $OT Visit: 1 Visit OT Evaluation $OT Eval Moderate Complexity: 1 Mod OT Treatments $Therapeutic Exercise: 8-22 mins  Nestor Lewandowsky, OTR/L Acute Rehabilitation Services Pager: [phone] Office: [phone]  [name] 07/21/2019, 1:12 PM

## 2019-07-21 NOTE — Progress Notes (Signed)
Orthopedics Progress Note  Subjective: Confused this AM. No pain  Objective:  Vitals:   07/20/19 1900 07/21/19 0259  BP: (!) 100/49 (!) 119/51  Pulse: 90 85  Resp: 18 17  Temp: 98.1 F (36.7 C) 98.1 F (36.7 C)  SpO2: 93% 93%    General: Awake and alert  Musculoskeletal: right shoulder dressing intact Wiggles fingers, sensation intact Neurovascularly intact  Lab Results  Component Value Date   WBC 9.3 07/21/2019   HGB 9.3 (L) 07/21/2019   HCT 30.8 (L) 07/21/2019   MCV 92.8 07/21/2019   PLT 141 (L) 07/21/2019       Component Value Date/Time   NA 142 07/20/2019 1533   K 3.7 07/20/2019 1533   CL 104 07/20/2019 1533   CO2 24 07/19/2019 2022   GLUCOSE 115 (H) 07/20/2019 1533   BUN 8 07/20/2019 1533   CREATININE 0.60 07/20/2019 1533   CREATININE 0.90 (H) 04/22/2017 1603   CALCIUM 8.4 (L) 07/19/2019 2022   GFRNONAA >60 07/19/2019 2022   GFRNONAA 60 04/22/2017 1603   GFRAA >60 07/19/2019 2022   GFRAA 70 04/22/2017 1603    Lab Results  Component Value Date   INR 1.8 (H) 07/20/2019   INR 1.9 (H) 07/19/2019   INR 1.8 (A) 07/14/2019    Assessment/Plan: POD #1 s/p Procedure(s): REVERSE TOTAL SHOULDER REPLACEMENT OT, mobilization D/C planning  Remo Lipps R. Veverly Fells, MD 07/21/2019 7:55 AM

## 2019-07-21 NOTE — TOC Initial Note (Signed)
Transition of Care [facility]) - Initial/Assessment Note    Patient Details  Name: [name] MRN: [id number] Date of Birth: 05-12-1934  Transition of Care [facility] CM/SW Contact:    Curlene Labrum, RN Phone Number: 07/21/2019, 2:24 PM  Clinical Narrative:                 Case Management met with the patient regarding discharge needs - S/P Reverse total shoulder arthroplasty on 6/8 by Dr. Veverly Fells.  Patient lives with her husband at the house.  The patient has had both COVID vaccines.  Patient was given medicare choice regarding home health and skilled nursing facilities.  The patient would like to be placed at a skilled nursing facility of her choice if she approves of the facilities health grades - otherwise she would like to have home health PT/OT.  The patient will need a 3:1 if she is discharged home. Will continue to follow.  I will placed a SNF workup and patient will decide her preference.  Expected Discharge Plan: Skilled Nursing Facility Barriers to Discharge: No Barriers Identified   Patient Goals and CMS Choice Patient states their goals for this hospitalization and ongoing recovery are:: Patient requests information for skilled facility versus home health before she is discharged. CMS Medicare.gov Compare Post Acute Care list provided to:: Patient Choice offered to / list presented to : Patient  Expected Discharge Plan and Services Expected Discharge Plan: [place]   Discharge Planning Services: CM Consult Post Acute Care Choice: [place], [place] Living arrangements for the past 2 months: Single Family Home                 DME Arranged: (patient will need a 3:1 if she decides to go home)                    Prior Living Arrangements/Services Living arrangements for the past 2 months: Single Family Home Lives with:: Spouse Patient language and need for interpreter reviewed:: Yes Do you feel safe  going back to the place where you live?: Yes      Need for Family Participation in Patient Care: Yes (Comment) Care giver support system in place?: Yes (comment) Current home services: DME(oxygen "compression" through [place] at home, cane) Criminal Activity/Legal Involvement Pertinent to Current Situation/Hospitalization: No - Comment as needed  Activities of Daily Living Home Assistive Devices/Equipment: Cane (specify quad or straight), Bedside commode/3-in-1, Shower chair with back, Walker (specify type), CPAP, Oxygen(handicap bathroom) ADL Screening (condition at time of admission) Patient's cognitive ability adequate to safely complete daily activities?: Yes Is the patient deaf or have difficulty hearing?: Yes Does the patient have difficulty seeing, even when wearing glasses/contacts?: No Does the patient have difficulty concentrating, remembering, or making decisions?: No Patient able to express need for assistance with ADLs?: Yes Does the patient have difficulty dressing or bathing?: Yes Independently performs ADLs?: No Communication: Independent Dressing (OT): Needs assistance Is this a change from baseline?: Pre-admission baseline Grooming: Independent Feeding: Independent Bathing: Needs assistance Is this a change from baseline?: Pre-admission baseline Toileting: Needs assistance Is this a change from baseline?: Pre-admission baseline In/Out Bed: Needs assistance Is this a change from baseline?: Change from baseline, expected to last >3 days Walks in Home: Needs assistance Is this a change from baseline?: Pre-admission baseline Does the patient have difficulty walking or climbing stairs?: Yes Weakness of Legs: Both Weakness of Arms/Hands: Right  Permission Sought/Granted Permission sought to share  information with : Case Manager Permission granted to share information with : Yes, Verbal Permission Granted     Permission granted to share info w AGENCY: SNF facility or  [place] granted to share info w Relationship: husband     Emotional Assessment Appearance:: Appears stated age Attitude/Demeanor/Rapport: Self-Confident Affect (typically observed): Accepting Orientation: : Oriented to Self, Oriented to Place, Oriented to  Time, Oriented to Situation Alcohol / Substance Use: Not Applicable Psych Involvement: No (comment)  Admission diagnosis:  Closed 2-part displaced fracture of surgical neck of right humerus [Z61.096E] Closed fracture of neck of right humerus, initial encounter [A54.098J] Patient Active Problem List   Diagnosis Date Noted  . Closed 2-part displaced fracture of surgical neck of right humerus 07/19/2019  . CHF (congestive heart failure) ([place]) 12/27/2018  . CAP (community acquired pneumonia) 12/27/2018  . Acute on chronic diastolic (congestive) heart failure ([place]) 07/14/2018  . Acute on chronic diastolic heart failure ([place]) 07/13/2018  . Lumbosacral spondylosis without myelopathy 09/26/2017  . Chronic low back pain 07/21/2017  . Degeneration of lumbar intervertebral disc 04/21/2017  . Lumbar post-laminectomy syndrome 04/21/2017  . Esophageal dysphagia 10/15/2016  . Gastroesophageal reflux disease without esophagitis 10/15/2016  . Seasonal allergic rhinitis 10/15/2016  . Chronic atrial fibrillation ([place]) 05/15/2015  . Aftercare following surgery of the circulatory system, [place] 09/15/2013  . Essential hypertension 06/17/2013  . Atrial fibrillation with RVR ([place]) 04/01/2013  . Morbid obesity ([place]) 04/01/2013  . OSA on CPAP 04/01/2013  . Chronic diastolic CHF (congestive heart failure) ([place])   . H/o recurrent pulmonary embolism, on Coumadin 05/24/2012  . Acute on chronic diastolic congestive heart failure ([place]) 05/24/2012  . Normocytic anemia 05/24/2012  . Thrombocytopenia ([place]) 05/24/2012  . Inadequate anticoagulation 05/24/2012  . Elevated brain natriuretic peptide (BNP) level 05/24/2012  . Shoulder arthritis  05/15/2012  . Stress incontinence 01/06/2012  . Chronic cystitis 01/06/2012  . Occlusion and stenosis of carotid artery without mention of cerebral infarction 09/10/2011  . OA (osteoarthritis) of hip 06/17/2011  . Osteoarthritis 01/08/2011  . Fibromyalgia 08/06/2010  . Dyslipidemia 08/06/2010  . Gout 08/06/2010  . Right carotid artery occlusion 08/06/2010   PCP:  Deland Pretty, MD Pharmacy:   [facility], [place] [address] Phone: [phone] Fax: [phone]     Social Determinants of Health (SDOH) Interventions    Readmission Risk Interventions Readmission Risk Prevention Plan 07/21/2019  Transportation Screening Complete  PCP or Specialist Appt within 3-5 Days Complete  HRI or [place] Complete  Social Work Consult for [place] Planning/Counseling Complete  Palliative Care Screening Complete  Medication Review Press photographer) Complete  Some recent data might be hidden

## 2019-07-21 NOTE — Plan of Care (Signed)
  Problem: Education: Goal: Knowledge of General Education information will improve Description: Including pain rating scale, medication(s)/side effects and non-pharmacologic comfort measures Outcome: Progressing   Problem: Activity: Goal: Risk for activity intolerance will decrease Outcome: Progressing   Problem: Elimination: Goal: Will not experience complications related to urinary retention Outcome: Completed/Met

## 2019-07-22 LAB — CBC
HCT: 26.7 % — ABNORMAL LOW (ref 36.0–46.0)
Hemoglobin: 8.3 g/dL — ABNORMAL LOW (ref 12.0–15.0)
MCH: 28.6 pg (ref 26.0–34.0)
MCHC: 31.1 g/dL (ref 30.0–36.0)
MCV: 92.1 fL (ref 80.0–100.0)
Platelets: 134 10*3/uL — ABNORMAL LOW (ref 150–400)
RBC: 2.9 MIL/uL — ABNORMAL LOW (ref 3.87–5.11)
RDW: 16.5 % — ABNORMAL HIGH (ref 11.5–15.5)
WBC: 8.2 10*3/uL (ref 4.0–10.5)
nRBC: 0.4 % — ABNORMAL HIGH (ref 0.0–0.2)

## 2019-07-22 LAB — PROTIME-INR
INR: 1.4 — ABNORMAL HIGH (ref 0.8–1.2)
Prothrombin Time: 16.4 seconds — ABNORMAL HIGH (ref 11.4–15.2)

## 2019-07-22 NOTE — Progress Notes (Signed)
Occupational Therapy Treatment Patient Details Name: [name] MRN: [id number] DOB: 1934/05/13 Today's Date: 07/22/2019    History of present illness Pt is a 85 y.o. F with significant PMH of atrial fibrillation, CHF, fibromyalgia, HTN, PE, back surgery, right femur fracture surgery, bilateral hip replacement, right knee replacement, and L TSA. Pt presents with a fall and R proximal humerus fracture. Now s/p right reverse TSA 07/20/2019.    OT comments  Patient continues to make steady progress towards goals in skilled OT session. Patient's session encompassed extensive education with regard to ADL management and shoulder exercises. Therapist spoke at length with pt and spouse and pt is now in agreement to going to a SNF to get increased rehab due to the fact that spouse will be unable to take care of her in her current status and that pt continues to require max verbal cues and demonstrations to complete shoulder exercises and sling management appropriately. Discharge remains appropriate at this time; will continue to follow acutely.    Follow Up Recommendations  SNF;Supervision/Assistance - 24 hour    Equipment Recommendations       Recommendations for Other Services      Precautions / Restrictions Precautions Precautions: Fall;Shoulder Type of Shoulder Precautions: AROM EWH, OK for pendulums, Forward flexion to 90, no abduction, ER to O okay, no AROM shoulder Shoulder Interventions: Shoulder sling/immobilizer;Off for dressing/bathing/exercises Precaution Booklet Issued: Yes (comment) Precaution Comments: Additional handout required Required Braces or Orthoses: Sling Restrictions Weight Bearing Restrictions: Yes RUE Weight Bearing: Non weight bearing Other Position/Activity Restrictions: Ok to WB for cane or walker use       Mobility Bed Mobility               General bed mobility comments: OOB in chair  Transfers Overall transfer level: Needs assistance Equipment  used: 1 person hand held assist Transfers: Sit to/from Stand Sit to Stand: Min assist         General transfer comment: min assist to rise and steady to complete pendulums    Balance Overall balance assessment: Needs assistance Sitting-balance support: Feet supported Sitting balance-Leahy Scale: Fair     Standing balance support: Single extremity supported Standing balance-Leahy Scale: Poor Standing balance comment: reliant on at least single UE support                           ADL either performed or assessed with clinical judgement   ADL Overall ADL's : Needs assistance/impaired                                       General ADL Comments: Session focus on exercises, education for pt and husband, and further discharge planning     Vision       Perception     Praxis      Cognition Arousal/Alertness: Awake/alert Behavior During Therapy: WFL for tasks assessed/performed Overall Cognitive Status: Impaired/Different from baseline Area of Impairment: Safety/judgement;Awareness;Problem solving                         Safety/Judgement: Decreased awareness of deficits Awareness: Emergent Problem Solving: Difficulty sequencing;Requires verbal cues General Comments: continues to require increased cueing to complete exercises, multiple repetitions and step by step instructions needed        Exercises Shoulder Exercises Pendulum Exercise: Self ROM;20 reps;Right;Seated Shoulder External  Rotation: Self ROM;Right;15 reps;Seated (cues needed for appropriate movement)   Shoulder Instructions Shoulder Instructions Donning/doffing sling/immobilizer: Moderate assistance Correct positioning of sling/immobilizer: Moderate assistance Pendulum exercises (written home exercise program): Maximal assistance ROM for elbow, wrist and digits of operated UE: Set-up;Min-guard Sling wearing schedule (on at all times/off for ADL's): Moderate assistance  (Requires cues to recall precautions and STM noted)     General Comments      Pertinent Vitals/ Pain       Pain Assessment: Faces Faces Pain Scale: Hurts a little bit Pain Location: R shoulder with PROM Pain Descriptors / Indicators: Aching;Grimacing;Guarding Pain Intervention(s): Limited activity within patient's tolerance;Monitored during session;Repositioned  Home Living                                          Prior Functioning/Environment              Frequency  Min 2X/week        Progress Toward Goals  OT Goals(current goals can now be found in the care plan section)  Progress towards OT goals: Progressing toward goals  Acute Rehab OT Goals Patient Stated Goal: get stronger OT Goal Formulation: With patient Time For Goal Achievement: 08/04/19 Potential to Achieve Goals: [place] Discharge plan remains appropriate    Co-evaluation                 AM-PAC OT "6 Clicks" Daily Activity     Outcome Measure   Help from another person eating meals?: A Little Help from another person taking care of personal grooming?: A Little Help from another person toileting, which includes using toliet, bedpan, or urinal?: Total Help from another person bathing (including washing, rinsing, drying)?: A Lot Help from another person to put on and taking off regular upper body clothing?: A Lot Help from another person to put on and taking off regular lower body clothing?: Total 6 Click Score: 12    End of Session Equipment Utilized During Treatment: Other (comment) (sling)  OT Visit Diagnosis: Unsteadiness on feet (R26.81);Pain;Muscle weakness (generalized) (M62.81);History of falling (Z91.81);Other symptoms and signs involving cognitive function   Activity Tolerance Patient tolerated treatment well   Patient Left in chair;with call bell/phone within reach;with chair alarm set;with family/visitor present   Nurse Communication Mobility status         Time: 5361-4431 OT Time Calculation (min): 29 min  Charges: OT General Charges $OT Visit: 1 Visit OT Treatments $Self Care/Home Management : 8-22 mins $Therapeutic Exercise: 8-22 mins  Corinne Ports E. Furkan Keenum, COTA/L Acute Rehabilitation Services [place] 07/22/2019, 2:13 PM

## 2019-07-22 NOTE — Progress Notes (Signed)
   Subjective:  Patient reports pain as mild.   No complaints this am.  Objective:   VITALS:   Vitals:   07/21/19 1546 07/21/19 2021 07/22/19 0355 07/22/19 0820  BP: (!) 96/32 (!) 154/97 (!) 153/68 131/62  Pulse: 89 100 72 79  Resp: 20 18 15 18   Temp: 97.8 F (36.6 C) 97.6 F (36.4 C) 98.6 F (37 C) (!) 97.5 F (36.4 C)  TempSrc:  Oral Oral Oral  SpO2: 93% 94% 97% 97%  Weight:      Height:        Neurologically intact Neurovascular intact Sensation intact distally Intact pulses distally Incision: dressing C/D/I Sling in place  Lab Results  Component Value Date   WBC 8.2 07/22/2019   HGB 8.3 (L) 07/22/2019   HCT 26.7 (L) 07/22/2019   MCV 92.1 07/22/2019   PLT 134 (L) 07/22/2019   BMET    Component Value Date/Time   NA 142 07/20/2019 1533   K 3.7 07/20/2019 1533   CL 104 07/20/2019 1533   CO2 24 07/19/2019 2022   GLUCOSE 115 (H) 07/20/2019 1533   BUN 8 07/20/2019 1533   CREATININE 0.60 07/20/2019 1533   CREATININE 0.90 (H) 04/22/2017 1603   CALCIUM 8.4 (L) 07/19/2019 2022   GFRNONAA >60 07/19/2019 2022   GFRNONAA 60 04/22/2017 1603   GFRAA >60 07/19/2019 2022   GFRAA 70 04/22/2017 1603     Assessment/Plan: 2 Days Post-Op   Active Problems:   Closed 2-part displaced fracture of surgical neck of right humerus   Up with therapy -  supplemental oxygen weaned when able  - ok to use Right arm from cane/walker, otherwise no WB to RUE, with sling   - will change dressing to aquacel before dc  - will follow up progress with PT/OT.  SNF vs. HHPT ( I have spoken to [facility], who have agreed to see and treat if needed.)   - will follow progress today, as well as monitor cbc in am for her ABLA, currently asymptomatic at 8.3  - no use of left upper arm for BP checks   Nicholes Stairs 07/22/2019, 9:27 AM   Geralynn Rile, MD [phone]

## 2019-07-22 NOTE — Progress Notes (Signed)
Physical Therapy Treatment Patient Details Name: [name] MRN: [id number] DOB: 24-Oct-1934 Today's Date: 07/22/2019    History of Present Illness Pt is a 85 y.o. F with significant PMH of atrial fibrillation, CHF, fibromyalgia, HTN, PE, back surgery, right femur fracture surgery, bilateral hip replacement, right knee replacement, and L TSA. Pt presents with a fall and R proximal humerus fracture. Now s/p right reverse TSA 07/20/2019.     PT Comments    Pt progressed gt distance.  Continues to present with poor safety when mobilizing.  Required supplemental O2 to maintain saturations.  Pt continues to benefit from snf placement.     Follow Up Recommendations  SNF;Supervision/Assistance - 24 hour     Equipment Recommendations  3in1 (PT)    Recommendations for Other Services       Precautions / Restrictions Precautions Precautions: Fall;Shoulder Type of Shoulder Precautions: AROM EWH, OK for pendulums, Forward flexion to 90, no abduction, ER to O okay, no AROM shoulder Shoulder Interventions: Shoulder sling/immobilizer;Off for dressing/bathing/exercises Precaution Booklet Issued: Yes (comment) Required Braces or Orthoses: Sling Restrictions Weight Bearing Restrictions: Yes RUE Weight Bearing: Non weight bearing Other Position/Activity Restrictions: Ok to WB for cane or walker use    Mobility  Bed Mobility Overal bed mobility: Needs Assistance Bed Mobility: Supine to Sit;Sit to Supine     Supine to sit: Mod assist;+2 for physical assistance Sit to supine: Mod assist;+2 for physical assistance   General bed mobility comments: assistance for LE advancement and trunk elevation to come to sitting.  assistance to lift B LEs back to bed and lower trunk.  Transfers Overall transfer level: Needs assistance Equipment used: Rolling walker (2 wheeled) (youth height RW) Transfers: Sit to/from Stand Sit to Stand: Min assist;+2 physical assistance         General transfer  comment: Min assistance for transfers with cues for hand placement  Ambulation/Gait Ambulation/Gait assistance: Mod assist;+2 safety/equipment Gait Distance (Feet): 25 Feet (x2) Assistive device: Rolling walker (2 wheeled) (youth height) Gait Pattern/deviations: Step-to pattern;Trunk flexed;Shuffle;Decreased stride length     General Gait Details: Unsteady gt with moderate assistance to correct balance.  SPO2 on RA 84%, required 1L [place] to maintain SPO2.   Stairs             Wheelchair Mobility    Modified Rankin (Stroke Patients Only)       Balance Overall balance assessment: Needs assistance Sitting-balance support: Feet supported Sitting balance-Leahy Scale: Fair     Standing balance support: Single extremity supported Standing balance-Leahy Scale: Poor                              Cognition Arousal/Alertness: Awake/alert Behavior During Therapy: WFL for tasks assessed/performed Overall Cognitive Status: Impaired/Different from baseline Area of Impairment: Safety/judgement;Awareness;Problem solving                         Safety/Judgement: Decreased awareness of deficits Awareness: Emergent Problem Solving: Difficulty sequencing;Requires verbal cues General Comments: continues to require increased cueing to complete activities with multiple reps and step by step instruction to maintain safety.      Exercises      General Comments        Pertinent Vitals/Pain Pain Assessment: Faces Faces Pain Scale: Hurts a little bit Pain Location: R shoulder with PROM Pain Descriptors / Indicators: Aching;Grimacing;Guarding Pain Intervention(s): Repositioned    Home Living  Prior Function            PT Goals (current goals can now be found in the care plan section) Acute Rehab PT Goals Patient Stated Goal: get stronger Potential to Achieve Goals: Good Progress towards PT goals: Progressing toward goals     Frequency    Min 5X/week      PT Plan Current plan remains appropriate    Co-evaluation              AM-PAC PT "6 Clicks" Mobility   Outcome Measure  Help needed turning from your back to your side while in a flat bed without using bedrails?: A Little Help needed moving from lying on your back to sitting on the side of a flat bed without using bedrails?: A Lot Help needed moving to and from a bed to a chair (including a wheelchair)?: A Little Help needed standing up from a chair using your arms (e.g., wheelchair or bedside chair)?: A Little Help needed to walk in hospital room?: A Little Help needed climbing 3-5 steps with a railing? : A Lot 6 Click Score: 16    End of Session Equipment Utilized During Treatment: Gait belt Activity Tolerance: Patient limited by fatigue Patient left: in chair;with call bell/phone within reach;with chair alarm set Nurse Communication: Mobility status;Other (comment) (vitals) PT Visit Diagnosis: Pain;Difficulty in walking, not elsewhere classified (R26.2);Muscle weakness (generalized) (M62.81);History of falling (Z91.81) Pain - Right/Left: Right Pain - part of body: Shoulder (back)     Time: 1594-5859 PT Time Calculation (min) (ACUTE ONLY): 37 min  Charges:  $Gait Training: 8-22 mins $Therapeutic Activity: 8-22 mins                     Erasmo Leventhal , PTA Acute Rehabilitation Services Pager [phone] Office [phone]     [name] 07/22/2019, 6:38 PM

## 2019-07-22 NOTE — TOC Transition Note (Signed)
Transition of Care [facility]) - CM/SW Discharge Note   Patient Details  Name: [name] MRN: [id number] Date of Birth: 1934/08/27  [facility]) CM/SW Contact:  Curlene Labrum, RN Phone Number: 07/22/2019, 3:03 PM   Clinical Narrative:     Case management spoke with the patient and husband to offer medicare choice regarding SNF placement.  Patient chose 1. [facility], 2. [facility], and 3. [facility].  I called [facility]F and left a message with Abigail Butts, who is covering for Deirdre Pippins at [facility] about bed availability.  Will continue to follow for discharge and transfer to the SNF.  Final next level of care: Skilled Nursing Facility Barriers to Discharge: No Barriers Identified   Patient Goals and CMS Choice Patient states their goals for this hospitalization and ongoing recovery are:: Patient requests information for skilled facility versus home health before she is discharged. CMS Medicare.gov Compare Post Acute Care list provided to:: Patient Choice offered to / list presented to : Patient  Discharge Placement                       Discharge Plan and Services   Discharge Planning Services: CM Consult Post Acute Care Choice: [place]          DME Arranged:  (patient will need a 3:1 if she decides to go home)                    Social Determinants of Health (SDOH) Interventions     Readmission Risk Interventions Readmission Risk Prevention Plan 07/21/2019  Transportation Screening Complete  PCP or Specialist Appt within 3-5 Days Complete  HRI or [place] Complete  Social Work Consult for [place] Planning/Counseling Complete  Palliative Care Screening Complete  Medication Review Press photographer) Complete  Some recent data might be hidden

## 2019-07-22 NOTE — Discharge Instructions (Signed)
-  Maintain postoperative bandage until your follow-up appointment.  You may shower with this bandage in place, but do not submerge underwater.  -Okay to use the right arm with a cane or walker.  However no lifting with the right arm.  Otherwise she should remain in your sling unless performing exercises as shown by your therapist or activities of daily living.  -Apply ice to the right shoulder for 20 to 30 minutes out of each hour that you are awake.  -For mild to moderate pain use Tylenol and Advil around-the-clock.  For breakthrough pain use your narcotic as necessary.  -Return to see Dr. Stann Mainland in the office in 2 weeks for routine follow-up.

## 2019-07-23 LAB — PROTIME-INR
INR: 1.4 — ABNORMAL HIGH (ref 0.8–1.2)
Prothrombin Time: 16.2 seconds — ABNORMAL HIGH (ref 11.4–15.2)

## 2019-07-23 LAB — CBC
HCT: 29.5 % — ABNORMAL LOW (ref 36.0–46.0)
Hemoglobin: 8.9 g/dL — ABNORMAL LOW (ref 12.0–15.0)
MCH: 28.1 pg (ref 26.0–34.0)
MCHC: 30.2 g/dL (ref 30.0–36.0)
MCV: 93.1 fL (ref 80.0–100.0)
Platelets: 189 10*3/uL (ref 150–400)
RBC: 3.17 MIL/uL — ABNORMAL LOW (ref 3.87–5.11)
RDW: 17.1 % — ABNORMAL HIGH (ref 11.5–15.5)
WBC: 7.7 10*3/uL (ref 4.0–10.5)
nRBC: 0.8 % — ABNORMAL HIGH (ref 0.0–0.2)

## 2019-07-23 LAB — SARS CORONAVIRUS 2 (TAT 6-24 HRS): SARS Coronavirus 2: NEGATIVE

## 2019-07-23 MED ORDER — HYDROCODONE-ACETAMINOPHEN 5-325 MG PO TABS: 1.0000 | ORAL_TABLET | ORAL | 0 refills | Status: DC | PRN

## 2019-07-23 MED ORDER — ALBUTEROL SULFATE (2.5 MG/3ML) 0.083% IN NEBU
2.5000 mg | INHALATION_SOLUTION | RESPIRATORY_TRACT | Status: DC | PRN
  Administered 2019-07-23: 2.5 mg via RESPIRATORY_TRACT
  Filled 2019-07-23: qty 3

## 2019-07-23 NOTE — Discharge Summary (Addendum)
Patient ID: [name] MRN: [id number] DOB/AGE: [birth year] 85 y.o.  Admit date: 07/19/2019 Discharge date: 07/23/2019  Primary Diagnosis:  Right shoulder proximal humerus fracture  Admission Diagnoses:  Past Medical History:  Diagnosis Date  . Atrial fibrillation ([place])   . Carotid artery occlusion   . Chronic diastolic CHF (congestive heart failure) (HCC)    Hypertensive heart disease 02-12-14  . Crohn's disease ([place])   . Detached retina   . Fibromyalgia   . GERD (gastroesophageal reflux disease)   . Gout   . H/O hiatal hernia   . High triglycerides   . History of stomach ulcers 1950's  . Hypertension   . Long term current use of anticoagulant   . Obstructive sleep apnea on CPAP   . Osteoarthritis    PAIN AND OA LEF T HIP AND BOTH SHOULDERS ARE BONE ON BONE AND PAINFUL  . Peripheral vascular disease (HCC)    KNOWN RIGHT INTERNAL CAROTID ARTERY OCCLUSION (NO STROKE)  --40 TO 59% STENOSIS LEFT ICA-FOLLOWED BY DR. EARLY WITH DOPPLER STUDY EVERY 6 MONTHS  . Pulmonary embolism ([place]) 2011   a. Hx of PE in 02/2009 after R hip surgery, venous dopplers negative, long-term Coumadin.  [name] Kitchen PVC (premature ventricular contraction)    PT STATES HX OF PVC'S ON EKG  . Recurrent upper respiratory infection (URI)    BRONCHITIS FEB 2013--SLIGHT COUGH NON-PRODUCTIVE NOW  . Recurrent UTI (urinary tract infection)    "on daily medicine" (05/14/2012)  . Tinnitus   . Vertigo    Discharge Diagnoses:   Active Problems:   Closed 2-part displaced fracture of surgical neck of right humerus  Estimated body mass index is 35.12 kg/m as calculated from the following:   Height as of this encounter: 5' 2"  (1.575 m).   Weight as of this encounter: 87.1 kg.  Procedure:  Procedure(s) (LRB): REVERSE TOTAL SHOULDER REPLACEMENT (Right)   Consults: None  HPI: [name] is a very nice right-hand-dominant 85 year old female who sustained an unfortunate fall back at the beginning of the week.  This resulted in a  displaced and comminuted 2 part proximal humerus fracture.  She has concomitant chronic pain in that shoulder due to osteoarthritis.  She was indicated for reverse shoulder arthroplasty to manage her difficult situation.  She was admitted for that surgery and postoperative care. Laboratory Data: Admission on 07/19/2019  Component Date Value Ref Range Status  . Prothrombin Time 07/19/2019 21.0* 11.4 - 15.2 seconds Final  . INR 07/19/2019 1.9* 0.8 - 1.2 Final   Comment: (NOTE) INR goal varies based on device and disease states. Performed at [facility], [place] [address], [place],  [address]   . SARS Coronavirus 2 07/19/2019 NEGATIVE  NEGATIVE Final   Comment: (NOTE) SARS-CoV-2 target nucleic acids are NOT DETECTED. The SARS-CoV-2 RNA is generally detectable in upper and lower respiratory specimens during the acute phase of infection. The lowest concentration of SARS-CoV-2 viral copies this assay can detect is 250 copies / mL. A negative result does not preclude SARS-CoV-2 infection and should not be used as the sole basis for treatment or other patient management decisions.  A negative result may occur with improper specimen collection / handling, submission of specimen other than nasopharyngeal swab, presence of viral mutation(s) within the areas targeted by this assay, and inadequate number of viral copies (<250 copies / mL). A negative result must be combined with clinical observations, patient history, and epidemiological information. Fact Sheet for Patients:   StrictlyIdeas.no Fact Sheet  for Healthcare Providers: BankingDealers.co.za This test is not yet approved or cleared                           by the [place] and has been authorized for detection and/or diagnosis of SARS-CoV-2 by FDA under an Emergency Use Authorization (EUA).  This EUA will remain in effect (meaning this test can be used) for  the duration of the COVID-19 declaration under Section 564(b)(1) of the Act, 21 U.S.C. section 360bbb-3(b)(1), unless the authorization is terminated or revoked sooner. Performed at [facility], [place] [address] [place], [place] [address]   . Sodium 07/19/2019 140  135 - 145 mmol/L Final  . Potassium 07/19/2019 3.4* 3.5 - 5.1 mmol/L Final  . Chloride 07/19/2019 107  98 - 111 mmol/L Final  . CO2 07/19/2019 24  22 - 32 mmol/L Final  . Glucose, Bld 07/19/2019 126* 70 - 99 mg/dL Final   Glucose reference range applies only to samples taken after fasting for at least 8 hours.  . BUN 07/19/2019 12  8 - 23 mg/dL Final  . Creatinine, Ser 07/19/2019 0.54  0.44 - 1.00 mg/dL Final  . Calcium 07/19/2019 8.4* 8.9 - 10.3 mg/dL Final  . Total Protein 07/19/2019 6.7  6.5 - 8.1 g/dL Final  . Albumin 07/19/2019 3.3* 3.5 - 5.0 g/dL Final  . AST 07/19/2019 19  15 - 41 U/L Final  . ALT 07/19/2019 12  0 - 44 U/L Final  . Alkaline Phosphatase 07/19/2019 84  38 - 126 U/L Final  . Total Bilirubin 07/19/2019 0.8  0.3 - 1.2 mg/dL Final  . GFR calc non Af Amer 07/19/2019 >60  >60 mL/min Final  . GFR calc Af Amer 07/19/2019 >60  >60 mL/min Final  . Anion gap 07/19/2019 9  5 - 15 Final   Performed at [facility], [place] [address], [place], [place] [address]  . WBC 07/19/2019 9.6  4.0 - 10.5 K/uL Final  . RBC 07/19/2019 4.12  3.87 - 5.11 MIL/uL Final  . Hemoglobin 07/19/2019 11.5* 12.0 - 15.0 g/dL Final  . HCT 07/19/2019 36.8  36 - 46 % Final  . MCV 07/19/2019 89.3  80.0 - 100.0 fL Final  . MCH 07/19/2019 27.9  26.0 - 34.0 pg Final  . MCHC 07/19/2019 31.3  30.0 - 36.0 g/dL Final  . RDW 07/19/2019 16.6* 11.5 - 15.5 % Final  . Platelets 07/19/2019 154  150 - 400 K/uL Final  . nRBC 07/19/2019 0.0  0.0 - 0.2 % Final  . Neutrophils Relative % 07/19/2019 79  % Final  . Neutro Abs 07/19/2019 7.7  1.7 - 7.7 K/uL Final  . Lymphocytes Relative 07/19/2019 11  % Final  . Lymphs Abs  07/19/2019 1.0  0.7 - 4.0 K/uL Final  . Monocytes Relative 07/19/2019 7  % Final  . Monocytes Absolute 07/19/2019 0.6  0 - 1 K/uL Final  . Eosinophils Relative 07/19/2019 2  % Final  . Eosinophils Absolute 07/19/2019 0.2  0 - 0 K/uL Final  . Basophils Relative 07/19/2019 0  % Final  . Basophils Absolute 07/19/2019 0.0  0 - 0 K/uL Final  . Immature Granulocytes 07/19/2019 1  % Final  . Abs Immature Granulocytes 07/19/2019 0.10* 0.00 - 0.07 K/uL Final   Performed at [facility], [place] [address] [place], [place] [address]  . aPTT 07/19/2019 39* 24 - 36 seconds Final  Comment:        IF BASELINE aPTT IS ELEVATED, SUGGEST PATIENT RISK ASSESSMENT BE USED TO DETERMINE APPROPRIATE ANTICOAGULANT THERAPY. Performed at [facility], [place] [address], [place], [place] [address]   . Prothrombin Time 07/20/2019 20.2* 11.4 - 15.2 seconds Final  . INR 07/20/2019 1.8* 0.8 - 1.2 Final   Comment: (NOTE) INR goal varies based on device and disease states. Performed at [facility], [place] [address] [place], [place] [address]   . MRSA, PCR 07/20/2019 NEGATIVE  NEGATIVE Final  . Staphylococcus aureus 07/20/2019 NEGATIVE  NEGATIVE Final   Comment: (NOTE) The Xpert SA Assay (FDA approved for NASAL specimens in patients 54 years of age and older), is one component of a comprehensive surveillance program. It is not intended to diagnose infection nor to guide or monitor treatment. Performed at [facility], [place] [address], [place], [place] [address]   . ABO/RH(D) 07/20/2019 A POS   Final  . Antibody Screen 07/20/2019 NEG   Final  . Sample Expiration 07/20/2019    Final                   Value:07/23/2019,2359 Performed at [facility], [place] [address] [place], [place] [address]   . Sodium 07/20/2019 142  135 - 145 mmol/L Final  . Potassium 07/20/2019 3.7  3.5 - 5.1 mmol/L Final  . Chloride 07/20/2019 104  98 - 111 mmol/L Final  . BUN  07/20/2019 8  8 - 23 mg/dL Final  . Creatinine, Ser 07/20/2019 0.60  0.44 - 1.00 mg/dL Final  . Glucose, Bld 07/20/2019 115* 70 - 99 mg/dL Final   Glucose reference range applies only to samples taken after fasting for at least 8 hours.  . Calcium, Ion 07/20/2019 1.19  1.15 - 1.40 mmol/L Final  . TCO2 07/20/2019 24  22 - 32 mmol/L Final  . Hemoglobin 07/20/2019 9.9* 12.0 - 15.0 g/dL Final  . HCT 07/20/2019 29.0* 36 - 46 % Final  . WBC 07/21/2019 9.3  4.0 - 10.5 K/uL Final  . RBC 07/21/2019 3.32* 3.87 - 5.11 MIL/uL Final  . Hemoglobin 07/21/2019 9.3* 12.0 - 15.0 g/dL Final  . HCT 07/21/2019 30.8* 36 - 46 % Final  . MCV 07/21/2019 92.8  80.0 - 100.0 fL Final  . MCH 07/21/2019 28.0  26.0 - 34.0 pg Final  . MCHC 07/21/2019 30.2  30.0 - 36.0 g/dL Final  . RDW 07/21/2019 16.4* 11.5 - 15.5 % Final  . Platelets 07/21/2019 141* 150 - 400 K/uL Final  . nRBC 07/21/2019 0.0  0.0 - 0.2 % Final   Performed at [facility], [place] [address], [place], [place] [address]  . WBC 07/22/2019 8.2  4.0 - 10.5 K/uL Final  . RBC 07/22/2019 2.90* 3.87 - 5.11 MIL/uL Final  . Hemoglobin 07/22/2019 8.3* 12.0 - 15.0 g/dL Final  . HCT 07/22/2019 26.7* 36 - 46 % Final  . MCV 07/22/2019 92.1  80.0 - 100.0 fL Final  . MCH 07/22/2019 28.6  26.0 - 34.0 pg Final  . MCHC 07/22/2019 31.1  30.0 - 36.0 g/dL Final  . RDW 07/22/2019 16.5* 11.5 - 15.5 % Final  . Platelets 07/22/2019 134* 150 - 400 K/uL Final   Comment: SPECIMEN CHECKED FOR CLOTS REPEATED TO VERIFY   . nRBC 07/22/2019 0.4* 0.0 - 0.2 % Final   Performed at [facility], [place] [address] [place], [place] [address]  . Prothrombin  Time 07/22/2019 16.4* 11.4 - 15.2 seconds Final  . INR 07/22/2019 1.4* 0.8 - 1.2 Final   Comment: (NOTE) INR goal varies based on device and disease states. Performed at [facility], [place] [address] [place], [place] [address]   . SARS Coronavirus 2 07/23/2019 NEGATIVE  NEGATIVE Final   Comment: (NOTE) SARS-CoV-2  target nucleic acids are NOT DETECTED.  The SARS-CoV-2 RNA is generally detectable in upper and lower respiratory specimens during the acute phase of infection. Negative results do not preclude SARS-CoV-2 infection, do not rule out co-infections with other pathogens, and should not be used as the sole basis for treatment or other patient management decisions. Negative results must be combined with clinical observations, patient history, and epidemiological information. The expected result is Negative.  Fact Sheet for Patients: SugarRoll.be  Fact Sheet for Healthcare Providers: https://www.woods-mathews.com/  This test is not yet approved or cleared by the [place] FDA and  has been authorized for detection and/or diagnosis of SARS-CoV-2 by FDA under an Emergency Use Authorization (EUA). This EUA will remain  in effect (meaning this test can be used) for the duration of the COVID-19 declaration under Se                          ction 564(b)(1) of the Act, 21 U.S.C. section 360bbb-3(b)(1), unless the authorization is terminated or revoked sooner.  Performed at [facility], [place] [address], [place], [place] [address]   . Prothrombin Time 07/23/2019 16.2* 11.4 - 15.2 seconds Final  . INR 07/23/2019 1.4* 0.8 - 1.2 Final   Comment: (NOTE) INR goal varies based on device and disease states. Performed at [facility], [place] [address], [place], [place] [address]   . WBC 07/23/2019 7.7  4.0 - 10.5 K/uL Final  . RBC 07/23/2019 3.17* 3.87 - 5.11 MIL/uL Final  . Hemoglobin 07/23/2019 8.9* 12.0 - 15.0 g/dL Final  . HCT 07/23/2019 29.5* 36 - 46 % Final  . MCV 07/23/2019 93.1  80.0 - 100.0 fL Final  . MCH 07/23/2019 28.1  26.0 - 34.0 pg Final  . MCHC 07/23/2019 30.2  30.0 - 36.0 g/dL Final  . RDW 07/23/2019 17.1* 11.5 - 15.5 % Final  . Platelets 07/23/2019 189  150 - 400 K/uL Final  . nRBC 07/23/2019 0.8* 0.0 - 0.2 % Final    Performed at [place] [address] [place], [place] [address]  Anti-coag visit on 07/14/2019  Component Date Value Ref Range Status  . INR 07/14/2019 1.8* 2.0 - 3.0 Final  Anti-coag visit on 05/31/2019  Component Date Value Ref Range Status  . INR 05/31/2019 2.1  2.0 - 3.0 Final     X-Rays:DG Shoulder Right  Result Date: 07/19/2019 CLINICAL DATA:  [place], right shoulder pain EXAM: RIGHT SHOULDER - 2+ VIEW; RIGHT HUMERUS - 2+ VIEW COMPARISON:  None. FINDINGS: Right shoulder: Internal rotation, external rotation, and transscapular views of the right shoulder are obtained. Evaluation limited by body habitus. There is a comminuted humeral neck fracture with impaction. Severe glenohumeral osteoarthritis. No dislocation. Right humerus: Frontal and lateral views are obtained, limited by body habitus. There is a comminuted fracture of the right humeral neck, with ventral angulation at the fracture site. Osteoarthritis of the glenohumeral joint. No dislocation. The right elbow is unremarkable. IMPRESSION: 1. Comminuted impacted right humeral neck fracture. 2. Severe glenohumeral osteoarthritis.  No dislocation. Electronically Signed   By: Legrand Como  Owens Shark M.D.   On: 07/19/2019 18:08   CT Head Wo Contrast  Result Date: 07/19/2019 CLINICAL DATA:  [place] EXAM: CT HEAD WITHOUT CONTRAST TECHNIQUE: Contiguous axial images were obtained from the base of the skull through the vertex without intravenous contrast. COMPARISON:  11/09/2004 FINDINGS: Brain: There is diffuse hypodensity throughout the right temporal white matter, new since prior study. There is gray-white preservation throughout the majority of the right temporal lobe, with no significant cortical atrophy. Appearance is atypical for ischemic change, and could reflect an element of vasogenic edema. MRI recommended for further evaluation. No other signs of acute infarct or hemorrhage. Lateral ventricles and midline structures are otherwise unremarkable.  Chronic small vessel ischemic changes are seen within the bilateral basal ganglia. There are no acute extra-axial fluid collections. Vascular: No hyperdense vessel or unexpected calcification. Skull: Normal. Negative for fracture or focal lesion. Sinuses/Orbits: No acute finding. Other: None. IMPRESSION: 1. Diffuse hypodensity throughout the right temporal lobe, with gray-white preservation of the majority of the right temporal lobe. This is atypical for ischemic change, and could reflect an element of vasogenic edema. MRI recommended for further evaluation. 2. No evidence of hemorrhage. Electronically Signed   By: Randa Ngo M.D.   On: 07/19/2019 19:11   CT Cervical Spine Wo Contrast  Result Date: 07/19/2019 CLINICAL DATA:  Fall.  On warfarin. EXAM: CT CERVICAL SPINE WITHOUT CONTRAST TECHNIQUE: Multidetector CT imaging of the cervical spine was performed without intravenous contrast. Multiplanar CT image reconstructions were also generated. COMPARISON:  None. FINDINGS: Alignment: No traumatic malalignment. Mild reversal of the normal cervical lordosis. Trace anterolisthesis at C3-C4 and C4-C5. Skull base and vertebrae: No acute fracture. No primary bone lesion or focal pathologic process. Soft tissues and spinal canal: No prevertebral fluid or swelling. No visible canal hematoma. Disc levels: Moderate to severe disc height loss and moderate uncovertebral hypertrophy at C5-C6. Diffuse moderate left and mild right facet arthropathy throughout the cervical spine. Upper chest: Negative. Other: None. IMPRESSION: 1. No acute cervical spine fracture or traumatic malalignment. 2. Moderate to severe degenerative changes at C5-C6. Electronically Signed   By: Titus Dubin M.D.   On: 07/19/2019 19:57   CT SHOULDER RIGHT WO CONTRAST  Result Date: 07/19/2019 CLINICAL DATA:  Proximal humerus fracture after fall. EXAM: CT OF THE UPPER RIGHT EXTREMITY WITHOUT CONTRAST TECHNIQUE: Multidetector CT imaging of the upper  right extremity was performed according to the standard protocol. COMPARISON:  Right shoulder and humerus x-rays from same day. FINDINGS: Bones/Joint/Cartilage Acute fracture of the proximal humerus surgical neck with 7 mm medial and anterior displacement. The fracture extends to the base of the greater tuberosity. No involvement of the lesser tuberosity. No dislocation. Severe glenohumeral joint space narrowing with prominent subchondral cystic change. Small glenohumeral joint effusion with multiple small intra-articular bodies. Mild acromioclavicular joint osteoarthritis. Ligaments Ligaments are suboptimally evaluated by CT. Muscles and Tendons Grossly intact.  No significant muscle atrophy. Soft tissue No fluid collection or hematoma. No soft tissue mass. The visualized right lung is clear. IMPRESSION: 1. Acute mildly displaced fracture of the proximal humerus surgical neck as described above. 2. Severe glenohumeral osteoarthritis with multiple small intra-articular bodies. Electronically Signed   By: Titus Dubin M.D.   On: 07/19/2019 20:02   DG Shoulder Right Port  Result Date: 07/20/2019 CLINICAL DATA:  Status post reverse total shoulder arthroplasty, right. EXAM: PORTABLE RIGHT SHOULDER COMPARISON:  Preoperative radiograph yesterday. FINDINGS: Reverse right shoulder arthroplasty in expected alignment. No periprosthetic lucency or fracture. Acromioclavicular joint  is congruent. Expected postsurgical change in the soft tissues. IMPRESSION: Reverse right shoulder arthroplasty without immediate postoperative complication. Electronically Signed   By: Keith Rake M.D.   On: 07/20/2019 18:43   DG Humerus Right  Result Date: 07/19/2019 CLINICAL DATA:  [place], right shoulder pain EXAM: RIGHT SHOULDER - 2+ VIEW; RIGHT HUMERUS - 2+ VIEW COMPARISON:  None. FINDINGS: Right shoulder: Internal rotation, external rotation, and transscapular views of the right shoulder are obtained. Evaluation limited by body  habitus. There is a comminuted humeral neck fracture with impaction. Severe glenohumeral osteoarthritis. No dislocation. Right humerus: Frontal and lateral views are obtained, limited by body habitus. There is a comminuted fracture of the right humeral neck, with ventral angulation at the fracture site. Osteoarthritis of the glenohumeral joint. No dislocation. The right elbow is unremarkable. IMPRESSION: 1. Comminuted impacted right humeral neck fracture. 2. Severe glenohumeral osteoarthritis.  No dislocation. Electronically Signed   By: Randa Ngo M.D.   On: 07/19/2019 18:08   DG MINI C-ARM IMAGE ONLY  Result Date: 07/20/2019 There is no interpretation for this exam.  This order is for images obtained during a surgical procedure.  Please See "Surgeries" Tab for more information regarding the procedure.    EKG: Orders placed or performed during the hospital encounter of 07/19/19  . ED EKG  . ED EKG     Hospital Course: DELL HURTUBISE is a 85 y.o. who was admitted to Hospital. They were brought to the operating room on 07/20/2019 and underwent Procedure(s): REVERSE TOTAL SHOULDER REPLACEMENT.  Patient tolerated the procedure well and was later transferred to the recovery room and then to the orthopaedic floor for postoperative care.  They were given PO and IV analgesics for pain control following their surgery.  They were given 24 hours of postoperative antibiotics of  Anti-infectives (From admission, onward)   Start     Dose/Rate Route Frequency Ordered Stop   07/21/19 0600  ceFAZolin (ANCEF) IVPB 2g/100 mL premix  Status:  Discontinued        2 g 200 mL/hr over 30 Minutes Intravenous On call to O.R. 07/20/19 1233 07/20/19 1249   07/20/19 1730  ceFAZolin (ANCEF) IVPB 1 g/50 mL premix        1 g 100 mL/hr over 30 Minutes Intravenous Every 6 hours 07/20/19 1729 07/21/19 0913   07/20/19 1457  vancomycin (VANCOCIN) powder  Status:  Discontinued          As needed 07/20/19 1457 07/20/19 1609    07/20/19 0600  ceFAZolin (ANCEF) IVPB 2g/100 mL premix        2 g 200 mL/hr over 30 Minutes Intravenous On call to O.R. 07/19/19 2324 07/20/19 1444     and started on DVT prophylaxis in the form of Coumadin.   She had adequate pain control throughout the entire course of her care.  She did have a little bit of delirium her first night here with [place] due to the narcotic use.  Subsequent to that, we DC'd her tramadol and she cleared up nicely.  She had expected acute blood loss anemia which we monitored until we reached the nadir.  That has since recovered.  She did not require any blood transfusion.  She has worked with inpatient occupational and physical therapist while here with, and has been recommended for skilled nursing facility.  She continues to have adequate pain control.  Her incisions are nicely healing and her right upper extremity is neurovascular intact.  She is indicated for  discharge at this time to a skilled nursing facility with follow-up in the next 2 weeks.  Diet: Regular diet Activity:NWB Follow-up:in 2 weeks Disposition - Skilled nursing facility Discharged Condition: good   Discharge Instructions    Call MD / Call 911   Complete by: As directed    If you experience chest pain or shortness of breath, CALL 911 and be transported to the hospital emergency room.  If you develope a fever above 101 F, pus (white drainage) or increased drainage or redness at the wound, or calf pain, call your surgeon's office.   Constipation Prevention   Complete by: As directed    Drink plenty of fluids.  Prune juice may be helpful.  You may use a stool softener, such as Colace (over the counter) 100 mg twice a day.  Use MiraLax (over the counter) for constipation as needed.   Diet - low sodium heart healthy   Complete by: As directed    Increase activity slowly as tolerated   Complete by: As directed      Allergies as of 07/23/2019      Reactions   Codeine    NAUSEA   Cymbalta [duloxetine  Hcl]    Nausea VOMITING AND ABDOMINAL PAIN, HEADACHE, JUST ABOUT EVERY SIDE EFFECT THE DRUG HAS   Diltiazem Cd [diltiazem Hcl Er Beads]    Heavy legs, cough   Duloxetine    Other reaction(s): Other (See Comments) Nausea VOMITING AND ABDOMINAL PAIN, HEADACHE, JUST ABOUT EVERY SIDE EFFECT THE DRUG HAS   Metoprolol    Legs like cement   Nortriptyline    Nightmares   Penicillins    RASH  & ITCHING   --PT STATES SHE CAN TAKE KEFLEX PO AND IV CEPHALOSPORINS   Tolerate IV ancef on 07/20/2019   Statins    Leg cramps   Tetanus Toxoids    SWELLING, REDNESS  WHOLE ARM   Ciprofloxacin Rash   RASH   Lyrica [pregabalin] Diarrhea, Nausea And Vomiting, Rash      Medication List    TAKE these medications   allopurinol 300 MG tablet Commonly known as: ZYLOPRIM Take 300 mg by mouth daily.   amLODipine 5 MG tablet Commonly known as: NORVASC Take 5 mg by mouth daily.   benzonatate 100 MG capsule Commonly known as: TESSALON Take 100 mg by mouth 3 (three) times daily as needed for cough.   brimonidine 0.2 % ophthalmic solution Commonly known as: ALPHAGAN Place 1 drop into the right eye 2 (two) times daily.   cholecalciferol 25 MCG (1000 UNIT) tablet Commonly known as: VITAMIN D3 Take 1,000 Units by mouth daily.   cimetidine 200 MG tablet Commonly known as: TAGAMET Take 200 mg by mouth 2 (two) times daily.   CRANBERRY CONCENTRATE PO Take 1 tablet by mouth daily.   dorzolamide-timolol 22.3-6.8 MG/ML ophthalmic solution Commonly known as: COSOPT Place 1 drop into both eyes 2 (two) times daily.   furosemide 20 MG tablet Commonly known as: LASIX Take 1 tab (31m)  daily for a week, then continue 1/2 tab (170m daily What changed:   how much to take  how to take this  when to take this  additional instructions   HYDROcodone-acetaminophen 5-325 MG tablet Commonly known as: NORCO/VICODIN Take 0.5-1 tablets by mouth every 6 (six) hours as needed for moderate pain. What changed:  Another medication with the same name was added. Make sure you understand how and when to take each.   HYDROcodone-acetaminophen 5-325 MG tablet  Commonly known as: NORCO/VICODIN Take 1 tablet by mouth every 4 (four) hours as needed for moderate pain. What changed: You were already taking a medication with the same name, and this prescription was added. Make sure you understand how and when to take each.   ipratropium 0.06 % nasal spray Commonly known as: ATROVENT Place 1 spray into both nostrils daily as needed for rhinitis.   loratadine 10 MG tablet Commonly known as: CLARITIN Take 10 mg by mouth daily as needed for allergies.   Lumigan 0.01 % Soln Generic drug: bimatoprost Place 1 drop into both eyes at bedtime.   mirtazapine 7.5 MG tablet Commonly known as: REMERON Take 7.5 mg by mouth at bedtime.   multivitamin with minerals Tabs tablet Take 1 tablet by mouth daily.   mupirocin ointment 2 % Commonly known as: BACTROBAN Apply 1 application topically 2 (two) times daily.   Nasacort Allergy 24HR 55 MCG/ACT Aero nasal inhaler Generic drug: triamcinolone Place 2 sprays into the nose daily as needed (allergies).   nebivolol 10 MG tablet Commonly known as: BYSTOLIC Take 1 tablet (10 mg total) by mouth at bedtime.   potassium chloride 10 MEQ tablet Commonly known as: KLOR-CON Take 2 tablets (20 mEq total) by mouth daily.   Rhopressa 0.02 % Soln Generic drug: Netarsudil Dimesylate Place 1 drop into the right eye at bedtime.   silver sulfADIAZINE 1 % cream Commonly known as: Silvadene Apply 1 application topically daily.   warfarin 2.5 MG tablet Commonly known as: COUMADIN Take as directed. If you are unsure how to take this medication, talk to your nurse or doctor. Original instructions: TAKE 1 TO 1&1/2 TABLETS DAILY AS DIRECTED BY COUMADIN CLINIC. What changed: See the new instructions.        Signed: Geralynn Rile, MD Orthopaedic Surgery 07/23/2019, 4:10  PM

## 2019-07-23 NOTE — Progress Notes (Signed)
Pt discharged to [facility], transported via [facility].  Discharge paperwork and recent vitals provided to PTAR.  Pt stable in no acute distress.

## 2019-07-23 NOTE — TOC Transition Note (Addendum)
Transition of Care [facility]) - CM/SW Discharge Note   Patient Details  Name: [name] MRN: [id number] Date of Birth: 1934-11-08  Transition of Care [facility]) CM/SW Contact:  Curlene Labrum, RN Phone Number: 07/23/2019, 12:34 PM   Clinical Narrative:     Case Management met with the family and patient and the patient has chosen 1. [facility] and then 2. [facility] are their choice for skilled nursing facility.  I called [facility] and they are looking at bed availability.  A secure chat was sent to Dr. Dennie Maizes to update him that the patient would be able to transfer today or tomorrow if a bed is available.  07/23/19-  [facility] does not have an available bed until Monday.  Spoke with the patient and the patient would like to be placed at [facility] SNF.  [facility] called and they have bed availability.  The patient's son is signing paperwork today for her admission when the patient is clear for discharge.  Final next level of care: Skilled Nursing Facility Barriers to Discharge: No Barriers Identified   Patient Goals and CMS Choice Patient states their goals for this hospitalization and ongoing recovery are:: Patient requests information for skilled facility versus home health before she is discharged. CMS Medicare.gov Compare Post Acute Care list provided to:: Patient Choice offered to / list presented to : Patient  Discharge Placement                       Discharge Plan and Services   Discharge Planning Services: CM Consult Post Acute Care Choice: [place]          DME Arranged:  (patient will need a 3:1 if she decides to go home)                    Social Determinants of Health (SDOH) Interventions     Readmission Risk Interventions Readmission Risk Prevention Plan 07/21/2019  Transportation Screening Complete  PCP or Specialist Appt within 3-5 Days Complete  HRI or [place] Complete   Social Work Consult for [place] Planning/Counseling Complete  Palliative Care Screening Complete  Medication Review Press photographer) Complete  Some recent data might be hidden

## 2019-07-23 NOTE — Anesthesia Postprocedure Evaluation (Signed)
Anesthesia Post Note  Patient: [name]  Procedure(s) Performed: REVERSE TOTAL SHOULDER REPLACEMENT (Right Shoulder)     Patient location during evaluation: PACU Anesthesia Type: General Level of consciousness: awake Pain management: pain level controlled Vital Signs Assessment: post-procedure vital signs reviewed and stable Respiratory status: spontaneous breathing Cardiovascular status: stable Postop Assessment: no apparent nausea or vomiting Anesthetic complications: no   No complications documented.  Last Vitals:  Vitals:   07/23/19 0352 07/23/19 0812  BP: (!) 171/83 (!) 144/91  Pulse: 96 70  Resp: 18 18  Temp: (!) 36.4 C (!) 36.4 C  SpO2: (!) 82% 93%    Last Pain:  Vitals:   07/23/19 0812  TempSrc: Oral  PainSc:                  Huston Foley

## 2019-07-27 DIAGNOSIS — Z03818 Encounter for observation for suspected exposure to other biological agents ruled out: Secondary | ICD-10-CM | POA: Diagnosis not present

## 2019-08-03 ENCOUNTER — Encounter (INDEPENDENT_AMBULATORY_CARE_PROVIDER_SITE_OTHER): Payer: Medicare Other | Admitting: Ophthalmology

## 2019-08-05 ENCOUNTER — Ambulatory Visit: Payer: Medicare Other | Admitting: Podiatry

## 2019-08-09 ENCOUNTER — Other Ambulatory Visit: Payer: Self-pay | Admitting: *Deleted

## 2019-08-09 NOTE — Patient Outreach (Signed)
Member screened for potential [facility] Care Management needs as a benefit of [place] Medicare.  [name] is receiving skilled therapy at [facility]. She is from home with husband.   Communication sent to facility SW to inquire about transition plans.  Will plan outreach to member/family to discuss [place] Management services as appropriate.   [name], MSN-Ed, RN,BSN [place] Acute Care Coordinator [phone] [facility]) [phone]  (Toll free office)

## 2019-08-11 ENCOUNTER — Other Ambulatory Visit: Payer: Self-pay | Admitting: *Deleted

## 2019-08-11 NOTE — Patient Outreach (Signed)
Screened for potential [facility] Care Management needs as a benefit of  [employer] Medicare.  [name] is currently receiving skilled therapy at [facility].   Writer attended telephonic interdisciplinary team meeting to assess for disposition needs and transition plan for resident.   Facility reports member will transition to home this Saturday with husband. She will have [facility].  Telephone call made to spouse/DPR [name] [phone]. Patient identifiers confirmed. [name] asks that writer call back in 3 or 4 days to speak with member about [facility] services.   Writer will plan outreach early next week to speak with [name] about [facility] Care Management needs. Monday will be July 4th holiday.    Bonnie Rolling, MSN-Ed, RN,BSN [facility] Acute Care Coordinator [phone] [facility]) [phone]  (Toll free office)

## 2019-08-12 DIAGNOSIS — K219 Gastro-esophageal reflux disease without esophagitis: Secondary | ICD-10-CM | POA: Diagnosis not present

## 2019-08-12 DIAGNOSIS — Z9181 History of falling: Secondary | ICD-10-CM | POA: Diagnosis not present

## 2019-08-12 DIAGNOSIS — Z20822 Contact with and (suspected) exposure to covid-19: Secondary | ICD-10-CM | POA: Diagnosis not present

## 2019-08-12 DIAGNOSIS — I110 Hypertensive heart disease with heart failure: Secondary | ICD-10-CM | POA: Diagnosis not present

## 2019-08-12 DIAGNOSIS — I2510 Atherosclerotic heart disease of native coronary artery without angina pectoris: Secondary | ICD-10-CM | POA: Diagnosis not present

## 2019-08-12 DIAGNOSIS — S42221D 2-part displaced fracture of surgical neck of right humerus, subsequent encounter for fracture with routine healing: Secondary | ICD-10-CM | POA: Diagnosis not present

## 2019-08-12 DIAGNOSIS — Z471 Aftercare following joint replacement surgery: Secondary | ICD-10-CM | POA: Diagnosis not present

## 2019-08-12 DIAGNOSIS — I509 Heart failure, unspecified: Secondary | ICD-10-CM | POA: Diagnosis not present

## 2019-08-12 DIAGNOSIS — D649 Anemia, unspecified: Secondary | ICD-10-CM | POA: Diagnosis not present

## 2019-08-12 DIAGNOSIS — M5136 Other intervertebral disc degeneration, lumbar region: Secondary | ICD-10-CM | POA: Diagnosis not present

## 2019-08-12 DIAGNOSIS — I10 Essential (primary) hypertension: Secondary | ICD-10-CM | POA: Diagnosis not present

## 2019-08-12 DIAGNOSIS — R0902 Hypoxemia: Secondary | ICD-10-CM | POA: Diagnosis not present

## 2019-08-12 DIAGNOSIS — M19012 Primary osteoarthritis, left shoulder: Secondary | ICD-10-CM | POA: Diagnosis not present

## 2019-08-12 DIAGNOSIS — R2681 Unsteadiness on feet: Secondary | ICD-10-CM | POA: Diagnosis not present

## 2019-08-12 DIAGNOSIS — R2689 Other abnormalities of gait and mobility: Secondary | ICD-10-CM | POA: Diagnosis not present

## 2019-08-12 DIAGNOSIS — M6281 Muscle weakness (generalized): Secondary | ICD-10-CM | POA: Diagnosis not present

## 2019-08-12 DIAGNOSIS — I340 Nonrheumatic mitral (valve) insufficiency: Secondary | ICD-10-CM | POA: Diagnosis not present

## 2019-08-12 DIAGNOSIS — M797 Fibromyalgia: Secondary | ICD-10-CM | POA: Diagnosis not present

## 2019-08-12 DIAGNOSIS — I482 Chronic atrial fibrillation, unspecified: Secondary | ICD-10-CM | POA: Diagnosis not present

## 2019-08-12 DIAGNOSIS — I361 Nonrheumatic tricuspid (valve) insufficiency: Secondary | ICD-10-CM | POA: Diagnosis not present

## 2019-08-12 DIAGNOSIS — I5033 Acute on chronic diastolic (congestive) heart failure: Secondary | ICD-10-CM | POA: Diagnosis not present

## 2019-08-12 DIAGNOSIS — M109 Gout, unspecified: Secondary | ICD-10-CM | POA: Diagnosis not present

## 2019-08-15 ENCOUNTER — Other Ambulatory Visit: Payer: Self-pay

## 2019-08-15 ENCOUNTER — Inpatient Hospital Stay (HOSPITAL_COMMUNITY)
Admission: EM | Admit: 2019-08-15 | Discharge: 2019-08-18 | DRG: 291 | Disposition: A | Discharge status: Rehabilitation Facility | Payer: Medicare Other | Attending: Internal Medicine | Admitting: Internal Medicine

## 2019-08-15 ENCOUNTER — Emergency Department (HOSPITAL_COMMUNITY): Payer: Medicare Other

## 2019-08-15 ENCOUNTER — Encounter (HOSPITAL_COMMUNITY): Payer: Self-pay | Admitting: Family Medicine

## 2019-08-15 ENCOUNTER — Inpatient Hospital Stay (HOSPITAL_COMMUNITY): Payer: Medicare Other

## 2019-08-15 DIAGNOSIS — M1610 Unilateral primary osteoarthritis, unspecified hip: Secondary | ICD-10-CM | POA: Diagnosis present

## 2019-08-15 DIAGNOSIS — E873 Alkalosis: Secondary | ICD-10-CM | POA: Diagnosis present

## 2019-08-15 DIAGNOSIS — I5032 Chronic diastolic (congestive) heart failure: Secondary | ICD-10-CM | POA: Diagnosis present

## 2019-08-15 DIAGNOSIS — G8929 Other chronic pain: Secondary | ICD-10-CM | POA: Diagnosis present

## 2019-08-15 DIAGNOSIS — J9601 Acute respiratory failure with hypoxia: Secondary | ICD-10-CM | POA: Diagnosis present

## 2019-08-15 DIAGNOSIS — K5090 Crohn's disease, unspecified, without complications: Secondary | ICD-10-CM | POA: Diagnosis present

## 2019-08-15 DIAGNOSIS — M797 Fibromyalgia: Secondary | ICD-10-CM | POA: Diagnosis present

## 2019-08-15 DIAGNOSIS — I482 Chronic atrial fibrillation, unspecified: Secondary | ICD-10-CM | POA: Diagnosis not present

## 2019-08-15 DIAGNOSIS — R791 Abnormal coagulation profile: Secondary | ICD-10-CM | POA: Diagnosis not present

## 2019-08-15 DIAGNOSIS — D649 Anemia, unspecified: Secondary | ICD-10-CM | POA: Diagnosis present

## 2019-08-15 DIAGNOSIS — I469 Cardiac arrest, cause unspecified: Secondary | ICD-10-CM | POA: Diagnosis not present

## 2019-08-15 DIAGNOSIS — M109 Gout, unspecified: Secondary | ICD-10-CM | POA: Diagnosis present

## 2019-08-15 DIAGNOSIS — I110 Hypertensive heart disease with heart failure: Secondary | ICD-10-CM | POA: Diagnosis not present

## 2019-08-15 DIAGNOSIS — E876 Hypokalemia: Secondary | ICD-10-CM | POA: Diagnosis present

## 2019-08-15 DIAGNOSIS — I4821 Permanent atrial fibrillation: Secondary | ICD-10-CM | POA: Diagnosis present

## 2019-08-15 DIAGNOSIS — Z6835 Body mass index (BMI) 35.0-35.9, adult: Secondary | ICD-10-CM | POA: Diagnosis not present

## 2019-08-15 DIAGNOSIS — R0902 Hypoxemia: Secondary | ICD-10-CM

## 2019-08-15 DIAGNOSIS — Z9841 Cataract extraction status, right eye: Secondary | ICD-10-CM

## 2019-08-15 DIAGNOSIS — Z96643 Presence of artificial hip joint, bilateral: Secondary | ICD-10-CM | POA: Diagnosis present

## 2019-08-15 DIAGNOSIS — Z8711 Personal history of peptic ulcer disease: Secondary | ICD-10-CM | POA: Diagnosis not present

## 2019-08-15 DIAGNOSIS — Z96651 Presence of right artificial knee joint: Secondary | ICD-10-CM | POA: Diagnosis present

## 2019-08-15 DIAGNOSIS — E785 Hyperlipidemia, unspecified: Secondary | ICD-10-CM | POA: Diagnosis present

## 2019-08-15 DIAGNOSIS — M62830 Muscle spasm of back: Secondary | ICD-10-CM | POA: Diagnosis present

## 2019-08-15 DIAGNOSIS — I10 Essential (primary) hypertension: Secondary | ICD-10-CM | POA: Diagnosis not present

## 2019-08-15 DIAGNOSIS — G8918 Other acute postprocedural pain: Secondary | ICD-10-CM | POA: Diagnosis not present

## 2019-08-15 DIAGNOSIS — T502X5A Adverse effect of carbonic-anhydrase inhibitors, benzothiadiazides and other diuretics, initial encounter: Secondary | ICD-10-CM | POA: Diagnosis not present

## 2019-08-15 DIAGNOSIS — Z90710 Acquired absence of both cervix and uterus: Secondary | ICD-10-CM

## 2019-08-15 DIAGNOSIS — M169 Osteoarthritis of hip, unspecified: Secondary | ICD-10-CM | POA: Diagnosis present

## 2019-08-15 DIAGNOSIS — Z7901 Long term (current) use of anticoagulants: Secondary | ICD-10-CM | POA: Diagnosis not present

## 2019-08-15 DIAGNOSIS — Z87891 Personal history of nicotine dependence: Secondary | ICD-10-CM

## 2019-08-15 DIAGNOSIS — Z9989 Dependence on other enabling machines and devices: Secondary | ICD-10-CM

## 2019-08-15 DIAGNOSIS — K219 Gastro-esophageal reflux disease without esophagitis: Secondary | ICD-10-CM | POA: Diagnosis present

## 2019-08-15 DIAGNOSIS — Z96611 Presence of right artificial shoulder joint: Secondary | ICD-10-CM | POA: Diagnosis present

## 2019-08-15 DIAGNOSIS — G4733 Obstructive sleep apnea (adult) (pediatric): Secondary | ICD-10-CM | POA: Diagnosis present

## 2019-08-15 DIAGNOSIS — I361 Nonrheumatic tricuspid (valve) insufficiency: Secondary | ICD-10-CM

## 2019-08-15 DIAGNOSIS — Z87440 Personal history of urinary (tract) infections: Secondary | ICD-10-CM

## 2019-08-15 DIAGNOSIS — R5381 Other malaise: Secondary | ICD-10-CM | POA: Diagnosis not present

## 2019-08-15 DIAGNOSIS — Z961 Presence of intraocular lens: Secondary | ICD-10-CM | POA: Diagnosis present

## 2019-08-15 DIAGNOSIS — Z20822 Contact with and (suspected) exposure to covid-19: Secondary | ICD-10-CM | POA: Diagnosis not present

## 2019-08-15 DIAGNOSIS — Z9981 Dependence on supplemental oxygen: Secondary | ICD-10-CM | POA: Diagnosis not present

## 2019-08-15 DIAGNOSIS — Z96612 Presence of left artificial shoulder joint: Secondary | ICD-10-CM | POA: Diagnosis present

## 2019-08-15 DIAGNOSIS — I5043 Acute on chronic combined systolic (congestive) and diastolic (congestive) heart failure: Secondary | ICD-10-CM | POA: Diagnosis not present

## 2019-08-15 DIAGNOSIS — H9190 Unspecified hearing loss, unspecified ear: Secondary | ICD-10-CM | POA: Diagnosis present

## 2019-08-15 DIAGNOSIS — Z83438 Family history of other disorder of lipoprotein metabolism and other lipidemia: Secondary | ICD-10-CM

## 2019-08-15 DIAGNOSIS — Z8249 Family history of ischemic heart disease and other diseases of the circulatory system: Secondary | ICD-10-CM

## 2019-08-15 DIAGNOSIS — Z86711 Personal history of pulmonary embolism: Secondary | ICD-10-CM

## 2019-08-15 DIAGNOSIS — Z79899 Other long term (current) drug therapy: Secondary | ICD-10-CM

## 2019-08-15 DIAGNOSIS — I5033 Acute on chronic diastolic (congestive) heart failure: Secondary | ICD-10-CM | POA: Diagnosis not present

## 2019-08-15 DIAGNOSIS — I340 Nonrheumatic mitral (valve) insufficiency: Secondary | ICD-10-CM | POA: Diagnosis not present

## 2019-08-15 DIAGNOSIS — Z79891 Long term (current) use of opiate analgesic: Secondary | ICD-10-CM

## 2019-08-15 DIAGNOSIS — Z823 Family history of stroke: Secondary | ICD-10-CM

## 2019-08-15 DIAGNOSIS — Z833 Family history of diabetes mellitus: Secondary | ICD-10-CM

## 2019-08-15 DIAGNOSIS — G894 Chronic pain syndrome: Secondary | ICD-10-CM | POA: Diagnosis not present

## 2019-08-15 DIAGNOSIS — D62 Acute posthemorrhagic anemia: Secondary | ICD-10-CM

## 2019-08-15 DIAGNOSIS — G4700 Insomnia, unspecified: Secondary | ICD-10-CM | POA: Diagnosis present

## 2019-08-15 DIAGNOSIS — I509 Heart failure, unspecified: Secondary | ICD-10-CM | POA: Diagnosis not present

## 2019-08-15 DIAGNOSIS — E781 Pure hyperglyceridemia: Secondary | ICD-10-CM | POA: Diagnosis present

## 2019-08-15 DIAGNOSIS — I739 Peripheral vascular disease, unspecified: Secondary | ICD-10-CM | POA: Diagnosis present

## 2019-08-15 DIAGNOSIS — N179 Acute kidney failure, unspecified: Secondary | ICD-10-CM | POA: Diagnosis not present

## 2019-08-15 DIAGNOSIS — R578 Other shock: Secondary | ICD-10-CM | POA: Diagnosis not present

## 2019-08-15 DIAGNOSIS — Z9049 Acquired absence of other specified parts of digestive tract: Secondary | ICD-10-CM

## 2019-08-15 DIAGNOSIS — G479 Sleep disorder, unspecified: Secondary | ICD-10-CM | POA: Diagnosis not present

## 2019-08-15 DIAGNOSIS — I4891 Unspecified atrial fibrillation: Secondary | ICD-10-CM | POA: Diagnosis present

## 2019-08-15 DIAGNOSIS — M62838 Other muscle spasm: Secondary | ICD-10-CM | POA: Diagnosis present

## 2019-08-15 LAB — BASIC METABOLIC PANEL
Anion gap: 5 (ref 5–15)
BUN: 15 mg/dL (ref 8–23)
CO2: 31 mmol/L (ref 22–32)
Calcium: 8.9 mg/dL (ref 8.9–10.3)
Chloride: 107 mmol/L (ref 98–111)
Creatinine, Ser: 0.82 mg/dL (ref 0.44–1.00)
GFR calc Af Amer: >60 mL/min (ref >60)
GFR calc non Af Amer: >60 mL/min (ref >60)
Glucose, Bld: 128 mg/dL — ABNORMAL HIGH (ref 70–99)
Potassium: 4.1 mmol/L (ref 3.5–5.1)
Sodium: 143 mmol/L (ref 135–145)

## 2019-08-15 LAB — CBC
HCT: 30.1 % — ABNORMAL LOW (ref 36.0–46.0)
Hemoglobin: 8.7 g/dL — ABNORMAL LOW (ref 12.0–15.0)
MCH: 28.1 pg (ref 26.0–34.0)
MCHC: 28.9 g/dL — ABNORMAL LOW (ref 30.0–36.0)
MCV: 97.1 fL (ref 80.0–100.0)
Platelets: 179 10*3/uL (ref 150–400)
RBC: 3.1 MIL/uL — ABNORMAL LOW (ref 3.87–5.11)
RDW: 17.9 % — ABNORMAL HIGH (ref 11.5–15.5)
WBC: 5.6 10*3/uL (ref 4.0–10.5)
nRBC: 0.4 % — ABNORMAL HIGH (ref 0.0–0.2)

## 2019-08-15 LAB — SARS CORONAVIRUS 2 BY RT PCR (HOSPITAL ORDER, PERFORMED IN ~~LOC~~ HOSPITAL LAB): SARS Coronavirus 2: NEGATIVE

## 2019-08-15 LAB — PROTIME-INR
INR: 2.8 — ABNORMAL HIGH (ref 0.8–1.2)
Prothrombin Time: 28.4 seconds — ABNORMAL HIGH (ref 11.4–15.2)

## 2019-08-15 LAB — BRAIN NATRIURETIC PEPTIDE: B Natriuretic Peptide: 342.7 pg/mL — ABNORMAL HIGH (ref 0.0–100.0)

## 2019-08-15 LAB — ECHOCARDIOGRAM COMPLETE
Height: 62 in
Weight: 3209.9 oz

## 2019-08-15 LAB — IRON AND TIBC
Iron: 41 ug/dL (ref 28–170)
Saturation Ratios: 12 % (ref 10.4–31.8)
TIBC: 329 ug/dL (ref 250–450)
UIBC: 288 ug/dL

## 2019-08-15 LAB — VITAMIN B12: Vitamin B-12: 518 pg/mL (ref 180–914)

## 2019-08-15 LAB — TROPONIN I (HIGH SENSITIVITY): Troponin I (High Sensitivity): 7 ng/L (ref <18)

## 2019-08-15 LAB — FERRITIN: Ferritin: 80 ng/mL (ref 11–307)

## 2019-08-15 LAB — MAGNESIUM: Magnesium: 1.5 mg/dL — ABNORMAL LOW (ref 1.7–2.4)

## 2019-08-15 MED ORDER — BRIMONIDINE TARTRATE 0.2 % OP SOLN
1.0000 [drp] | Freq: Two times a day (BID) | OPHTHALMIC | Status: DC
  Administered 2019-08-15 – 2019-08-18 (×6): 1 [drp] via OPHTHALMIC
  Filled 2019-08-15 (×2): qty 5

## 2019-08-15 MED ORDER — ALLOPURINOL 300 MG PO TABS
300.0000 mg | ORAL_TABLET | Freq: Every day | ORAL | Status: DC
  Administered 2019-08-15 – 2019-08-18 (×4): 300 mg via ORAL
  Filled 2019-08-15 (×4): qty 1

## 2019-08-15 MED ORDER — NEBIVOLOL HCL 10 MG PO TABS
10.0000 mg | ORAL_TABLET | Freq: Every day | ORAL | Status: DC
  Administered 2019-08-15 – 2019-08-17 (×3): 10 mg via ORAL
  Filled 2019-08-15 (×4): qty 1

## 2019-08-15 MED ORDER — LORATADINE 10 MG PO TABS: 10.0000 mg | ORAL_TABLET | Freq: Every day | ORAL | Status: DC | PRN

## 2019-08-15 MED ORDER — TRIAMCINOLONE ACETONIDE 55 MCG/ACT NA AERO
2.0000 | INHALATION_SPRAY | Freq: Every day | NASAL | Status: DC | PRN
  Filled 2019-08-15: qty 21.6

## 2019-08-15 MED ORDER — MIRTAZAPINE 15 MG PO TABS
7.5000 mg | ORAL_TABLET | Freq: Every day | ORAL | Status: DC
  Administered 2019-08-15: 7.5 mg via ORAL
  Filled 2019-08-15 (×3): qty 1

## 2019-08-15 MED ORDER — AMLODIPINE BESYLATE 5 MG PO TABS
5.0000 mg | ORAL_TABLET | Freq: Every day | ORAL | Status: DC
  Administered 2019-08-15 – 2019-08-18 (×4): 5 mg via ORAL
  Filled 2019-08-15 (×4): qty 1

## 2019-08-15 MED ORDER — FUROSEMIDE 10 MG/ML IJ SOLN
40.0000 mg | Freq: Two times a day (BID) | INTRAMUSCULAR | Status: AC
  Administered 2019-08-15 – 2019-08-17 (×5): 40 mg via INTRAVENOUS
  Filled 2019-08-15 (×5): qty 4

## 2019-08-15 MED ORDER — MAGNESIUM SULFATE 2 GM/50ML IV SOLN
2.0000 g | Freq: Once | INTRAVENOUS | Status: AC
  Administered 2019-08-15: 2 g via INTRAVENOUS
  Filled 2019-08-15: qty 50

## 2019-08-15 MED ORDER — VITAMIN D 25 MCG (1000 UNIT) PO TABS
1000.0000 [IU] | ORAL_TABLET | Freq: Every day | ORAL | Status: DC
  Administered 2019-08-15 – 2019-08-18 (×4): 1000 [IU] via ORAL
  Filled 2019-08-15 (×4): qty 1

## 2019-08-15 MED ORDER — HYDROCODONE-ACETAMINOPHEN 5-325 MG PO TABS
0.5000 | ORAL_TABLET | Freq: Four times a day (QID) | ORAL | Status: DC | PRN
  Administered 2019-08-15 – 2019-08-18 (×8): 1 via ORAL
  Filled 2019-08-15 (×8): qty 1

## 2019-08-15 MED ORDER — WARFARIN SODIUM 2.5 MG PO TABS: 2.5000 mg | ORAL_TABLET | ORAL | Status: DC

## 2019-08-15 MED ORDER — FUROSEMIDE 10 MG/ML IJ SOLN: 40.0000 mg | Freq: Two times a day (BID) | INTRAMUSCULAR | Status: DC

## 2019-08-15 MED ORDER — BENZONATATE 100 MG PO CAPS: 100.0000 mg | ORAL_CAPSULE | Freq: Three times a day (TID) | ORAL | Status: DC | PRN

## 2019-08-15 MED ORDER — FAMOTIDINE 20 MG PO TABS
10.0000 mg | ORAL_TABLET | Freq: Every day | ORAL | Status: DC
  Administered 2019-08-15 – 2019-08-18 (×4): 10 mg via ORAL
  Filled 2019-08-15 (×4): qty 1

## 2019-08-15 MED ORDER — DORZOLAMIDE HCL-TIMOLOL MAL 2-0.5 % OP SOLN
1.0000 [drp] | Freq: Two times a day (BID) | OPHTHALMIC | Status: DC
  Administered 2019-08-15 – 2019-08-18 (×6): 1 [drp] via OPHTHALMIC
  Filled 2019-08-15 (×2): qty 10

## 2019-08-15 MED ORDER — WARFARIN SODIUM 2.5 MG PO TABS
3.7500 mg | ORAL_TABLET | ORAL | Status: DC
  Filled 2019-08-15: qty 1

## 2019-08-15 MED ORDER — NETARSUDIL DIMESYLATE 0.02 % OP SOLN
1.0000 [drp] | Freq: Every day | OPHTHALMIC | Status: DC
  Administered 2019-08-16 – 2019-08-17 (×2): 1 [drp] via OPHTHALMIC

## 2019-08-15 MED ORDER — WARFARIN - PHYSICIAN DOSING INPATIENT: Freq: Every day | Status: DC

## 2019-08-15 MED ORDER — WARFARIN SODIUM 2.5 MG PO TABS
2.5000 mg | ORAL_TABLET | ORAL | Status: DC
  Administered 2019-08-15: 2.5 mg via ORAL
  Filled 2019-08-15: qty 1

## 2019-08-15 MED ORDER — IPRATROPIUM BROMIDE 0.06 % NA SOLN
1.0000 | Freq: Every day | NASAL | Status: DC | PRN
  Filled 2019-08-15: qty 15

## 2019-08-15 MED ORDER — FUROSEMIDE 10 MG/ML IJ SOLN
40.0000 mg | Freq: Once | INTRAMUSCULAR | Status: AC
  Administered 2019-08-15: 40 mg via INTRAVENOUS
  Filled 2019-08-15: qty 4

## 2019-08-15 NOTE — Progress Notes (Signed)
Patient refusing CPAP for the night.

## 2019-08-15 NOTE — Progress Notes (Signed)
NURSING PROGRESS NOTE  [name] [id number] Admission Data: 08/15/2019 8:11 AM Attending Provider: Elodia Florence., * QKM:MNOTR, Thayer Jew, MD Code Status: full  [name] is a 85 y.o. female patient admitted from ED:  -No acute distress noted.  -No complaints of shortness of breath.  -No complaints of chest pain.   Cardiac Monitoring: Box # 22 in place. Cardiac monitor yields:normal sinus rhythm.  Blood pressure (!) 149/81, pulse 76, temperature 97.8 F (36.6 C), temperature source Oral, resp. rate 19, height 5' 2"  (1.575 m), weight 91 kg, SpO2 96 %.   IV Fluids:  IV in place, occlusive dsg intact without redness, IV cath wrist left, condition patent and no redness none.   Allergies:  Codeine, Cymbalta [duloxetine hcl], Diltiazem cd [diltiazem hcl er beads], Duloxetine, Metoprolol, Nortriptyline, Penicillins, Statins, Tetanus toxoids, Ciprofloxacin, and Lyrica [pregabalin]  Past Medical History:   has a past medical history of Atrial fibrillation ([place]), CAP (community acquired pneumonia) (12/27/2018), Carotid artery occlusion, Chronic diastolic CHF (congestive heart failure) ([place]), Crohn's disease ([place]), Detached retina, Fibromyalgia, GERD (gastroesophageal reflux disease), Gout, H/O hiatal hernia, High triglycerides, History of stomach ulcers (1950's), Hypertension, Long term current use of anticoagulant, Obstructive sleep apnea on CPAP, Osteoarthritis, Peripheral vascular disease ([place]), Pulmonary embolism ([place]) (2011), PVC (premature ventricular contraction), Recurrent upper respiratory infection (URI), Recurrent UTI (urinary tract infection), Tinnitus, and Vertigo.  Past Surgical History:   has a past surgical history that includes Femur fracture surgery (Right, 02/21/09); Hammer toe surgery (Left, 10/25/08); Back surgery (10/29/06); Knee arthroscopy (Right, 07/26/05); Total hip arthroplasty (Right, 12/08/02); Excision Morton's neuroma (Right, 05/20/00); Total hip arthroplasty  (06/17/2011); Joint replacement (2009); Joint replacement (2012); Joint replacement (06/17/11); Fracture surgery (2011); Total shoulder arthroplasty (Left, 05/14/2012); Tonsillectomy and adenoidectomy (1958?); Appendectomy (1950's); Cholecystectomy (~ 1988); Vaginal hysterectomy (1971); Dilation and curettage of uterus; Decompressive lumbar laminectomy level 3 (~ 2002); Replacement total knee (Right, 02/19/2010); Cardiac catheterization (1970's); Cardiac catheterization; Cataract extraction w/ intraocular lens implant (Right, 2009); Total shoulder arthroplasty (Left, 05/14/2012); Eye surgery (Left, 2014); Eye surgery (Left, August 23, 2013); Spine surgery; and Revision total shoulder to reverse total shoulder (Right, 07/20/2019).  Social History:   reports that she quit smoking about 33 years ago. Her smoking use included cigarettes. She has a 7.50 pack-year smoking history. She has never used smokeless tobacco. She reports current alcohol use. She reports that she does not use drugs.  Skin: patient has scattered bruising on bilateral lower extremities and feet. Moisture associated underneath her breast in her groin area.   Patient/Family orientated to room. Information packet given to patient/family. Admission inpatient armband information verified with patient/family to include name and date of birth and placed on patient arm. Side rails up x 2, fall assessment and education completed with patient/family. Patient/family able to verbalize understanding of risk associated with falls and verbalized understanding to call for assistance before getting out of bed. Call light within reach. Patient/family able to voice and demonstrate understanding of unit orientation instructions.    Will continue to evaluate and treat per MD orders.

## 2019-08-15 NOTE — Progress Notes (Signed)
*  PRELIMINARY RESULTS* Echocardiogram 2D Echocardiogram has been performed.  [name] 08/15/2019, 4:37 PM

## 2019-08-15 NOTE — Progress Notes (Signed)
PROGRESS NOTE    [name]  ZJI:[id number] DOB: May 30, 1934 DOA: 08/15/2019 PCP: Deland Pretty, MD   Chief Complaint  Patient presents with   Shortness of Breath    Brief Narrative: [name] is [name] 85 y.o. female with PMH of Gerd, PUD, hypertension, hyperlipidemia, Carotid stenosis, Pafib, CHF (diastolic), OSA on Cpap, h/o PE presents with SOB and admitted for CHF exacerbation.  Patient reports she had been at [facility] after shoulder surgery last month. About three days ago she began to notice weight gain (about 5 lbs) and worsening SOB. She requested CXR and increased Lasix but was discharged regardless and presents to ER today. She has also noticed fatigue and progressive dyspnea. Denies headache, dizziness, fever, chills, cough, chest pain, abdominal pain, nausea, vomiting, diarrhea, constipation, dysuria, hematuria, hematochezia, melena, speech difficulty, trouble eating, confusion or any other complaints. She does have [name] portable O2 tank at home but only uses sparingly per husband and never for long amounts of time.   In the ED: Hypertensive. Requires non-rebreather with even small exertion such as moving around in bed, otherwise on 5L O2 and vitals otherwise stable. Labs remarkable for Hgb 8.7, BMP WNL. INR 2.8. BNP 342. COVID and Trop pending.  CXR c/w CHF exacerbation.  Patient was given 40 mg IV Lasix and admission requested.  Assessment & Plan:   Principal Problem:   Acute exacerbation of CHF (congestive heart failure) (HCC) Active Problems:   Gout   OA (osteoarthritis) of hip   Normocytic anemia   Chronic diastolic CHF (congestive heart failure) (HCC)   Morbid obesity (HCC)   OSA on CPAP   Essential hypertension   Chronic atrial fibrillation (HCC)   Acute on chronic diastolic (congestive) heart failure (HCC)   Gastroesophageal reflux disease without esophagitis  85 y.o. female with PMH of Gerd, PUD, hypertension, hyperlipidemia, Carotid stenosis,  Pafib, CHF (diastolic), OSA on Cpap, h/o PE presents with SOB and admitted for CHF exacerbation.  Acute respiratory failure with hypoxia in the setting of acute on chronic diastolic CHF, POA -SOB F7-5 days with weight gain, increased LE edema and progressive dyspnea -requiring 5L O2 on admission and non-rebreather for moving her in bed -Last Echo: June 2020: Normal EF, diastolic HF - repeat echo pending - continue lasix 40 IV BID  - she notes her dry weight at home is ~200 lbs, she's currently ~200.6 lbs (? If she has new dry weight) - I/O, daily weights - continue telemetry  - Continue bystolic  - elevated BNP, troponin wnl - therapy  Permanent atrial fibrillation  -Cont Warfarin; INR at goal on admission -Daily INR -Tele  Hypertension -continue amlodipine, Bystolic  Normocytic Anemia -Hgb 8.7 on admission; baseline -normal iron, ferritin.  Follow B12, folate.   Gout -Continue allopurinol  GERD -Continue home equivalent  OSA -Continue CPAP  Hypomagnesemia: replace and follow   Displaced and comminuted proximal humerus fracture s/p reverse shoulder arthroplasty - she was NWB to RUE at time of discharge (6/11) - she's followed up with Dr. Stann [facility] since then, will touch base to see if updated precautions - therapy   DVT prophylaxis: warfarin Code Status: full Family Communication: son and husband at bedside Disposition:   Status is: Inpatient  Remains inpatient appropriate because:Inpatient level of care appropriate due to severity of illness   Dispo: The patient is from: Home              Anticipated d/c is to: Home  Anticipated d/c date is: > 3 days              Patient currently is not medically stable to d/c.  Consultants:   none  Procedures:  none  Antimicrobials:  Anti-infectives (From admission, onward)   None     Subjective: C/o SOB Notes she started gaining weight at facility   Objective: Vitals:   08/15/19 0533  08/15/19 0604 08/15/19 0900 08/15/19 1148  BP: (!) 164/77 (!) 149/81 124/66 (!) 118/43  Pulse: (!) 51 76 70 69  Resp: 16 19 18 18   Temp: 98.2 F (36.8 C) 97.8 F (36.6 C) 97.9 F (36.6 C) 97.8 F (36.6 C)  TempSrc: Oral Oral Oral Oral  SpO2: 100% 96% 98% 92%  Weight:  91 kg    Height:        Intake/Output Summary (Last 24 hours) at 08/15/2019 1448 Last data filed at 08/15/2019 0900 Gross per 24 hour  Intake 240 ml  Output 1350 ml  Net -1110 ml   Filed Weights   08/15/19 0020 08/15/19 0604  Weight: 92.9 kg 91 kg    Examination:  General exam: Appears calm and comfortable  Respiratory system: Clear to auscultation. Respiratory effort normal. Cardiovascular system: S1 & S2 heard, RRR.  Gastrointestinal system: Abdomen is nondistended, soft and nontender Central nervous system: Alert and oriented. No focal neurological deficits. Extremities: 1+ LE edema, RUE in sling  Skin: No rashes, lesions or ulcers Psychiatry: Judgement and insight appear normal. Mood & affect appropriate.     Data Reviewed: I have personally reviewed following labs and imaging studies  CBC: Recent Labs  Lab 08/15/19 0306  WBC 5.6  HGB 8.7*  HCT 30.1*  MCV 97.1  PLT 481    Basic Metabolic Panel: Recent Labs  Lab 08/15/19 0306 08/15/19 0727  NA 143  --   K 4.1  --   CL 107  --   CO2 31  --   GLUCOSE 128*  --   BUN 15  --   CREATININE 0.82  --   CALCIUM 8.9  --   MG  --  1.5*    GFR: Estimated Creatinine Clearance: 54.6 mL/min (by C-G formula based on SCr of 0.82 mg/dL).  Liver Function Tests: No results for input(s): AST, ALT, ALKPHOS, BILITOT, PROT, ALBUMIN in the last 168 hours.  CBG: No results for input(s): GLUCAP in the last 168 hours.   Recent Results (from the past 240 hour(s))  SARS Coronavirus 2 by RT PCR (hospital order, performed in [facility] hospital lab) Nasopharyngeal Nasopharyngeal Swab     Status: None   Collection Time: 08/15/19  3:56 AM   Specimen:  Nasopharyngeal Swab  Result Value Ref Range Status   SARS Coronavirus 2 NEGATIVE NEGATIVE Final    Comment: (NOTE) SARS-CoV-2 target nucleic acids are NOT DETECTED.  The SARS-CoV-2 RNA is generally detectable in upper and lower respiratory specimens during the acute phase of infection. The lowest concentration of SARS-CoV-2 viral copies this assay can detect is 250 copies / mL. Ciarra Braddy negative result does not preclude SARS-CoV-2 infection and should not be used as the sole basis for treatment or other patient management decisions.  Zarya Lasseigne negative result may occur with improper specimen collection / handling, submission of specimen other than nasopharyngeal swab, presence of viral mutation(s) within the areas targeted by this assay, and inadequate number of viral copies (<250 copies / mL). Devian Bartolomei negative result must be combined with clinical observations, patient history, and epidemiological  information.  Fact Sheet for Patients:   StrictlyIdeas.no  Fact Sheet for Healthcare Providers: BankingDealers.co.za  This test is not yet approved or  cleared by the [place] FDA and has been authorized for detection and/or diagnosis of SARS-CoV-2 by FDA under an Emergency Use Authorization (EUA).  This EUA will remain in effect (meaning this test can be used) for the duration of the COVID-19 declaration under Section 564(b)(1) of the Act, 21 U.S.C. section 360bbb-3(b)(1), unless the authorization is terminated or revoked sooner.  Performed at [facility], [place] [address] [place], [place] [address]          Radiology Studies: DG Chest 2 View  Result Date: 08/15/2019 CLINICAL DATA:  Awoke from sleep with shortness of breath. EXAM: CHEST - 2 VIEW COMPARISON:  Radiograph 12/27/2018.  CT 07/13/2018 FINDINGS: Cardiomegaly. Diffuse interstitial and peribronchial thickening suspicious for pulmonary edema. Small bilateral pleural effusions and hazy  lung base opacities. No pneumothorax. Bilateral shoulder arthroplasties. IMPRESSION: Cardiomegaly with pulmonary edema and small bilateral pleural effusions. Findings most consistent with CHF. Electronically Signed   By: Keith Rake M.D.   On: 08/15/2019 02:38        Scheduled Meds:  allopurinol  300 mg Oral Daily   amLODipine  5 mg Oral Daily   brimonidine  1 drop Right Eye BID   cholecalciferol  1,000 Units Oral Daily   dorzolamide-timolol  1 drop Both Eyes BID   famotidine  10 mg Oral Daily   furosemide  40 mg Intravenous BID   mirtazapine  7.5 mg Oral QHS   nebivolol  10 mg Oral QHS   Netarsudil Dimesylate  1 drop Right Eye QHS   warfarin  2.5 mg Oral Once per day on Sun Tue Wed Thu Sat   [START ON 08/16/2019] warfarin  3.75 mg Oral Once per day on Mon Fri   Warfarin - Physician Dosing Inpatient   Does not apply q1600   Continuous Infusions:   LOS: 0 days    Time spent: over 30 min    Fayrene Helper, MD [facility]   To contact the attending provider between 7A-7P or the covering provider during after hours 7P-7A, please log into the web site [email or web address] and access using universal [place] password for that web site. If you do not have the password, please call the hospital operator.  08/15/2019, 2:48 PM

## 2019-08-15 NOTE — Evaluation (Signed)
Occupational Therapy Evaluation Patient Details Name: [name] MRN: [id number] DOB: 06-17-1934 Today's Date: 08/15/2019    History of Present Illness 85 y.o. female with PMH of Gerd, PUD, hypertension, hyperlipidemia, Carotid stenosis, Pafib, CHF (diastolic), OSA on Cpap, h/o PE presents with SOB and admitted for CHF exacerbation. Patient reports she had been at a rehab facility after shoulder surgery last month and was home for 1 day before coming to the hospital with weight gain and worsening SoB. In ED pt hypertensive requiring non-rebreather with even small movements. Pt admitted 08/15/19 for treatment of acute CHF   Clinical Impression   This 85 y/o female presents with the above. Pt was just recently in SNF for rehab s/p shoulder sx, reports was home less than 1 day prior to this admission (reports when returned home, had notable difficulty standing/moving and requiring non-emergent EMT to get into her home); reports was working with therapy in rehab mobilizing with [facility] and working on ADL tasks. Pt currently requiring minA-minA+2 for functional transfers using HHA today; requiring maxA for toileting and LB ADL, modA for seated UB ADL. Pt requiring 3L O2 to maintain O2 sats >90%. Pt presenting with weakness, decreased activity tolerance and overall mobility status. She will benefit from continued acute OT services and currently recommend SNF level therapies at time of discharge to progress pt's overall safety and independence with ADL and mobility. Will follow.     Follow Up Recommendations  SNF;Supervision/Assistance - 24 hour    Equipment Recommendations  Other (comment) (TBD)           Precautions / Restrictions Precautions Precautions: Fall;Shoulder Type of Shoulder Precautions: per last admission, okay for AROM E/W/H, OK for pendulums, Forward flexion to 90, no abduction, ER to neutral okay, no AROM shoulder Precaution Comments: per last admission "Ok to [facility] for cane or walker  use" Required Braces or Orthoses: Sling Restrictions Weight Bearing Restrictions: No Other Position/Activity Restrictions: assume NWB in RUE      Mobility Bed Mobility Overal bed mobility: Needs Assistance Bed Mobility: Supine to Sit     Supine to sit: Min assist;+2 for physical assistance     General bed mobility comments: pt able to manage LE off bed requires minAx2 for bringing trunk to upright and scooting hips to EoB, mild dizziness in sitting that dissipates within 1 min  Transfers Overall transfer level: Needs assistance   Transfers: Sit to/from Stand Sit to Stand: Min assist;+2 physical assistance         General transfer comment: minax2 for power up from bed due to inability to utilize R UE, min Ax1 for power up from [facility] with arm rests    Balance Overall balance assessment: Needs assistance Sitting-balance support: Feet supported;No upper extremity supported Sitting balance-Leahy Scale: Fair     Standing balance support: Bilateral upper extremity supported;Single extremity supported Standing balance-Leahy Scale: Poor                             ADL either performed or assessed with clinical judgement   ADL Overall ADL's : Needs assistance/impaired Eating/Feeding: Set up;Sitting   Grooming: Minimal assistance;Sitting   Upper Body Bathing: Moderate assistance;Sitting   Lower Body Bathing: Maximal assistance;Sitting/lateral leans;Sit to/from stand;+2 for safety/equipment;+2 for physical assistance   Upper Body Dressing : Moderate assistance;Sitting Upper Body Dressing Details (indicate cue type and reason): assist for sling management  Lower Body Dressing: Maximal assistance;Sitting/lateral leans;Sit to/from stand;+2 for safety/equipment;+2 for  physical assistance Lower Body Dressing Details (indicate cue type and reason): minA for standing balance  Toilet Transfer: Minimal assistance;+2 for safety/equipment;BSC;+2 for physical  assistance;Stand-pivot Toilet Transfer Details (indicate cue type and reason): stand pivot vs ambulation due to urgency Toileting- Clothing Manipulation and Hygiene: Maximal assistance;+2 for safety/equipment;Sitting/lateral lean;Sit to/from stand Toileting - Clothing Manipulation Details (indicate cue type and reason): pt required assist for pericare      Functional mobility during ADLs: Minimal assistance;+2 for safety/equipment;+2 for physical assistance (HHA)       Vision         Perception     Praxis      Pertinent Vitals/Pain Pain Assessment: Faces Faces Pain Scale: Hurts a little bit Pain Location: generalized -fibromyalgia and R shoulder pain Pain Descriptors / Indicators: Grimacing;Aching;Sore Pain Intervention(s): Limited activity within patient's tolerance;Monitored during session;Repositioned     Hand Dominance Right   Extremity/Trunk Assessment Upper Extremity Assessment Upper Extremity Assessment: RUE deficits/detail RUE Deficits / Details: pt with recent fall resulting in R shoulder sx RUE: Unable to fully assess due to immobilization RUE Coordination: decreased fine motor;decreased gross motor   Lower Extremity Assessment Lower Extremity Assessment: Defer to PT evaluation   Cervical / Trunk Assessment Cervical / Trunk Assessment: Kyphotic   Communication Communication Communication: No difficulties   Cognition Arousal/Alertness: Awake/alert Behavior During Therapy: WFL for tasks assessed/performed Overall Cognitive Status: Within Functional Limits for tasks assessed                                     General Comments  Pt on 2L O2 via [place] at rest with SaO2 96%O2, on RA SaO2 dropped to 83%O2, returned supplemental O2 and with purse lip breathing rebounded to 90%O2 with ambulation SaO2 again drops to 84%O2 with cuing for purse lip breathing rebounds to 91%O2    Exercises     Shoulder Instructions      Home Living Family/patient  expects to be discharged to:: Private residence Living Arrangements: Spouse/significant other Available Help at Discharge: Family;Available 24 hours/day Type of Home: House Home Access: Stairs to enter [employer] of Steps: 5 Entrance Stairs-Rails: Left Home Layout: One level     Bathroom Shower/Tub: Teacher, early years/pre: Standard     Home Equipment: Environmental consultant - 2 wheels;Walker - 4 wheels;Grab bars - tub/shower;Grab bars - toilet;Cane - single point;Shower seat          Prior Functioning/Environment          Comments: pt previously in [facility] for rehab, working with OT/PT on mobilizing with SPC and ADL completion, pt reports was home less than a day from rehab but couldn't get into her home or move around (non emergency EMT had to assist for getting into home)         OT Problem List: Obesity;Impaired UE functional use;Pain;Decreased strength;Decreased range of motion;Decreased activity tolerance;Impaired balance (sitting and/or standing);Decreased knowledge of use of DME or AE;Decreased knowledge of precautions      OT Treatment/Interventions: Self-care/ADL training;Therapeutic exercise;Energy conservation;DME and/or AE instruction;Therapeutic activities;Patient/family education;Balance training    OT Goals(Current goals can be found in the care plan section) Acute Rehab OT Goals Patient Stated Goal: be able to go out to eat with husband OT Goal Formulation: With patient Time For Goal Achievement: 08/29/19 Potential to Achieve Goals: Good  OT Frequency: Min 2X/week   Barriers to D/C:  Co-evaluation PT/OT/SLP Co-Evaluation/Treatment: Yes Reason for Co-Treatment: For patient/therapist safety;To address functional/ADL transfers PT goals addressed during session: Mobility/safety with mobility OT goals addressed during session: ADL's and self-care      AM-PAC OT "6 Clicks" Daily Activity     Outcome Measure Help from another  person eating meals?: A Little Help from another person taking care of personal grooming?: A Little Help from another person toileting, which includes using toliet, bedpan, or urinal?: A Lot Help from another person bathing (including washing, rinsing, drying)?: A Lot Help from another person to put on and taking off regular upper body clothing?: A Lot Help from another person to put on and taking off regular lower body clothing?: A Lot 6 Click Score: 14   End of Session Equipment Utilized During Treatment: Gait belt;Oxygen;Other (comment) (sling) Nurse Communication: Mobility status  Activity Tolerance: Patient tolerated treatment well Patient left: in chair;with call bell/phone within reach;with chair alarm set  OT Visit Diagnosis: Other abnormalities of gait and mobility (R26.89);Muscle weakness (generalized) (M62.81)                Time: 8864-8472 OT Time Calculation (min): 59 min Charges:  OT General Charges $OT Visit: 1 Visit OT Evaluation $OT Eval Moderate Complexity: 1 Mod OT Treatments $Self Care/Home Management : 23-37 mins  Lou Cal, OT Acute Rehabilitation Services Pager [phone] Office [phone]   Raymondo Band 08/15/2019, 12:59 PM

## 2019-08-15 NOTE — Plan of Care (Signed)
  Problem: Activity: Goal: Risk for activity intolerance will decrease Outcome: Progressing   

## 2019-08-15 NOTE — Evaluation (Signed)
Physical Therapy Evaluation Patient Details Name: [name] MRN: [id number] DOB: August 31, 1934 Today's Date: 08/15/2019   History of Present Illness  85 y.o. female with PMH of Gerd, PUD, hypertension, hyperlipidemia, Carotid stenosis, Pafib, CHF (diastolic), OSA on Cpap, h/o PE presents with SOB and admitted for CHF exacerbation. Patient reports she had been at a rehab facility after shoulder surgery last month and was home for 1 day before coming to the hospital with weight gain and worsening SoB. In ED pt hypertensive requiring non-rebreather with even small movements. Pt admitted 08/15/19 for treatment of acute CHF  Clinical Impression  PTA pt rehabbing shoulder surgery at [facility], home one day before coming to [facility]. Pt lives with husband in single story home with 5 steps to enter. Pt reports that at [facility] she was ambulating with SPC, and close min guard of PT/OT. Pt reports assist for bathing and dressing. Pt is currently limited in safe mobility by increased O2 demand (see General Comments) and fear of falling, in presence of decreased strength, and endurance. Given pt's current limited use of her R UE and current supplemental oxygen need, PT recommending return to SNF level rehab pending improvement during hospitalization. PT will continue to follow acutely.      Follow Up Recommendations SNF (pending progress)    Equipment Recommendations  None recommended by PT       Precautions / Restrictions Precautions Precautions: Fall;Shoulder Required Braces or Orthoses: Sling Restrictions Other Position/Activity Restrictions: assume NWB in RUE      Mobility  Bed Mobility Overal bed mobility: Needs Assistance Bed Mobility: Supine to Sit     Supine to sit: Min assist;+2 for physical assistance     General bed mobility comments: pt able to manage LE off bed requires minAx2 for bringing trunk to upright and scooting hips to EoB, mild dizziness in sitting that dissipates  within 1 min  Transfers Overall transfer level: Needs assistance   Transfers: Sit to/from Stand Sit to Stand: Min assist;+2 physical assistance         General transfer comment: minax2 for power up from bed due to inability to utilize R UE, min Ax1 for power up from [facility] with arm rests  Ambulation/Gait Ambulation/Gait assistance: Min assist;+2 safety/equipment Gait Distance (Feet): 20 Feet Assistive device: 1 person hand held assist Gait Pattern/deviations: Step-through pattern;Decreased step length - right;Decreased step length - left;Shuffle Gait velocity: slowed Gait velocity interpretation: <1.31 ft/sec, indicative of household ambulator General Gait Details: minAx2, one for R UE support with steadying and second for management of O2 tank. Pt with slow waddling gait, with mild instability no overt LoB        Balance Overall balance assessment: Needs assistance Sitting-balance support: Feet supported;No upper extremity supported Sitting balance-Leahy Scale: Fair     Standing balance support: Bilateral upper extremity supported;Single extremity supported Standing balance-Leahy Scale: Poor                               Pertinent Vitals/Pain Pain Assessment: Faces Faces Pain Scale: Hurts a little bit Pain Location: generalized -fibromyalgia and R shoulder pain Pain Descriptors / Indicators: Grimacing;Aching;Sore Pain Intervention(s): Limited activity within patient's tolerance;Monitored during session;Premedicated before session;Repositioned    Home Living Family/patient expects to be discharged to:: Private residence Living Arrangements: Spouse/significant other Available Help at Discharge: Family;Available 24 hours/day Type of Home: House Home Access: Stairs to enter Entrance Stairs-Rails: Left Entrance Stairs-Number of Steps: 5 Home  Layout: One level Home Equipment: Walker - 2 wheels;Walker - 4 wheels;Grab bars - tub/shower;Grab bars - toilet;Cane -  single point;Shower seat      Prior Function           Comments: pt previously in [facility] for rehab, working with OT/PT on mobilizing with SPC     Hand Dominance   Dominant Hand: Right    Extremity/Trunk Assessment   Upper Extremity Assessment Upper Extremity Assessment: Defer to OT evaluation    Lower Extremity Assessment Lower Extremity Assessment: Generalized weakness    Cervical / Trunk Assessment Cervical / Trunk Assessment: Kyphotic  Communication   Communication: No difficulties  Cognition Arousal/Alertness: Awake/alert Behavior During Therapy: WFL for tasks assessed/performed Overall Cognitive Status: Within Functional Limits for tasks assessed                                        General Comments General comments (skin integrity, edema, etc.): Pt on 2L O2 via [place] at rest with SaO2 96%O2, on RA SaO2 dropped to 83%O2, returned supplemental O2 and with purse lip breathing rebounded to 90%O2 with ambulation SaO2 again drops to 84%O2 with cuing for purse lip breathing rebounds to 91%O2        Assessment/Plan    PT Assessment Patient needs continued PT services  PT Problem List Decreased strength;Decreased activity tolerance;Decreased balance;Decreased mobility;Cardiopulmonary status limiting activity;Pain       PT Treatment Interventions DME instruction;Gait training;Functional mobility training;Stair training;Therapeutic activities;Therapeutic exercise;Balance training;Cognitive remediation;Patient/family education    PT Goals (Current goals can be found in the Care Plan section)  Acute Rehab PT Goals Patient Stated Goal: be able to go out to eat with husband PT Goal Formulation: With patient Time For Goal Achievement: 08/29/19 Potential to Achieve Goals: Good    Frequency Min 2X/week   Barriers to discharge        Co-evaluation PT/OT/SLP Co-Evaluation/Treatment: Yes Reason for Co-Treatment: For patient/therapist safety PT  goals addressed during session: Mobility/safety with mobility         AM-PAC PT "6 Clicks" Mobility  Outcome Measure Help needed turning from your back to your side while in a flat bed without using bedrails?: None Help needed moving from lying on your back to sitting on the side of a flat bed without using bedrails?: A Little Help needed moving to and from a bed to a chair (including a wheelchair)?: A Little Help needed standing up from a chair using your arms (e.g., wheelchair or bedside chair)?: A Little Help needed to walk in hospital room?: A Little Help needed climbing 3-5 steps with a railing? : A Lot 6 Click Score: 18    End of Session Equipment Utilized During Treatment: Gait belt;Oxygen Activity Tolerance: Patient limited by fatigue Patient left: in chair;with call bell/phone within reach;with chair alarm set Nurse Communication: Mobility status PT Visit Diagnosis: Unsteadiness on feet (R26.81);Other abnormalities of gait and mobility (R26.89);Muscle weakness (generalized) (M62.81);History of falling (Z91.81);Difficulty in walking, not elsewhere classified (R26.2);Pain Pain - part of body:  (generalized fibromyalgia )    Time: 7672-0947 PT Time Calculation (min) (ACUTE ONLY): 23 min   Charges:   PT Evaluation $PT Eval Moderate Complexity: 1 Mod          Imoni Kohen B. Migdalia Dk PT, DPT Acute Rehabilitation Services Pager [phone] Office [phone]   [place] 08/15/2019, 12:38 PM

## 2019-08-15 NOTE — ED Provider Notes (Signed)
TIME SEEN: 2:17 AM  CHIEF COMPLAINT: Shortness of breath, cough  HPI: Patient is an 85 year old female who presents to the emergency department with complaints of shortness of breath, cough.  Was found to have oxygen saturation of 76 at home with EMS.  She tells me that she does not use oxygen at home but per nursing staff she wears 2 L as needed.  She denies any chest pain.  No fever.  Recently had her Lasix dose increased per nursing note but has not started this increased dose yet.  She denies to me chest pain or chest discomfort.  History limited as she is very hard of hearing it is a poor historian.  Echo 07/14/2018:  IMPRESSIONS    1. The left ventricle has normal systolic function, with an ejection  fraction of 55-60%. The cavity size was normal. Left ventricular diastolic  function could not be evaluated secondary to atrial fibrillation.  2. The right ventricle has normal systolic function. The cavity was  normal. There is no increase in right ventricular wall thickness. Right  ventricular systolic pressure is normal with an estimated pressure of 27.9  mmHg.  3. Left atrial size was severely dilated.  4. Right atrial size was moderately dilated.  5. Trivial pericardial effusion is present.  6. The aortic root and ascending aorta are normal in size and structure.  7. The inferior vena cava was dilated in size with >50% respiratory  variability.   ROS: Level 5 caveat secondary to patient being a very poor historian  PAST MEDICAL HISTORY/PAST SURGICAL HISTORY:  Past Medical History:  Diagnosis Date  . Atrial fibrillation ([place])   . Carotid artery occlusion   . Chronic diastolic CHF (congestive heart failure) (HCC)    Hypertensive heart disease 02-12-14  . Crohn's disease ([place])   . Detached retina   . Fibromyalgia   . GERD (gastroesophageal reflux disease)   . Gout   . H/O hiatal hernia   . High triglycerides   . History of stomach ulcers 1950's  . Hypertension   .  Long term current use of anticoagulant   . Obstructive sleep apnea on CPAP   . Osteoarthritis    PAIN AND OA LEF T HIP AND BOTH SHOULDERS ARE BONE ON BONE AND PAINFUL  . Peripheral vascular disease (HCC)    KNOWN RIGHT INTERNAL CAROTID ARTERY OCCLUSION (NO STROKE)  --40 TO 59% STENOSIS LEFT ICA-FOLLOWED BY DR. EARLY WITH DOPPLER STUDY EVERY 6 MONTHS  . Pulmonary embolism ([place]) 2011   a. Hx of PE in 02/2009 after R hip surgery, venous dopplers negative, long-term Coumadin.  [name] Kitchen PVC (premature ventricular contraction)    PT STATES HX OF PVC'S ON EKG  . Recurrent upper respiratory infection (URI)    BRONCHITIS FEB 2013--SLIGHT COUGH NON-PRODUCTIVE NOW  . Recurrent UTI (urinary tract infection)    "on daily medicine" (05/14/2012)  . Tinnitus   . Vertigo     MEDICATIONS:  Prior to Admission medications   Medication Sig Start Date End Date Taking? Authorizing Provider  allopurinol (ZYLOPRIM) 300 MG tablet Take 300 mg by mouth daily. 06/09/16   [provider]  amLODipine (NORVASC) 5 MG tablet Take 5 mg by mouth daily. 04/24/18   [provider]  benzonatate (TESSALON) 100 MG capsule Take 100 mg by mouth 3 (three) times daily as needed for cough.    [provider]  brimonidine (ALPHAGAN) 0.2 % ophthalmic solution Place 1 drop into the right eye 2 (two) times daily.  03/20/18   [provider]  cholecalciferol (VITAMIN D3) 25 MCG (1000 UNIT) tablet Take 1,000 Units by mouth daily.    [provider]  cimetidine (TAGAMET) 200 MG tablet Take 200 mg by mouth 2 (two) times daily.    [provider]  CRANBERRY CONCENTRATE PO Take 1 tablet by mouth daily.    [provider]  dorzolamide-timolol (COSOPT) 22.3-6.8 MG/ML ophthalmic solution Place 1 drop into both eyes 2 (two) times daily.  06/28/13   [provider]  furosemide (LASIX) 20 MG tablet Take 1 tab (71m)  daily for a week, then continue 1/2 tab (143m daily Patient taking  differently: Take 20 mg by mouth daily.  12/31/18   Rai, RiVernelle EmeraldMD  HYDROcodone-acetaminophen (NORCO/VICODIN) 5-325 MG tablet Take 0.5-1 tablets by mouth every 6 (six) hours as needed for moderate pain.  06/10/18   [provider]  HYDROcodone-acetaminophen (NORCO/VICODIN) 5-325 MG tablet Take 1 tablet by mouth every 4 (four) hours as needed for moderate pain. 07/23/19 07/22/20  RoNicholes StairsMD  ipratropium (ATROVENT) 0.06 % nasal spray Place 1 spray into both nostrils daily as needed for rhinitis.  04/13/18   [provider]  loratadine (CLARITIN) 10 MG tablet Take 10 mg by mouth daily as needed for allergies.    [provider]  LUMIGAN 0.01 % SOLN Place 1 drop into both eyes at bedtime. 09/04/16   [provider]  mirtazapine (REMERON) 7.5 MG tablet Take 7.5 mg by mouth at bedtime. 06/17/19   [provider]  Multiple Vitamin (MULTIVITAMIN WITH MINERALS) TABS tablet Take 1 tablet by mouth daily.    [provider]  mupirocin ointment (BACTROBAN) 2 % Apply 1 application topically 2 (two) times daily. 04/22/17   WaTrula SladeDPM  nebivolol (BYSTOLIC) 10 MG tablet Take 1 tablet (10 mg total) by mouth at bedtime. 05/19/18   JoMartiniquePeter M, MD  potassium chloride (KLOR-CON) 10 MEQ tablet Take 2 tablets (20 mEq total) by mouth daily. 12/31/18 07/19/19  Rai, Ripudeep K, MD  RHOPRESSA 0.02 % SOLN Place 1 drop into the right eye at bedtime. 07/13/19   [provider]  silver sulfADIAZINE (SILVADENE) 1 % cream Apply 1 application topically daily. 12/12/15   Tuchman, Richard C,Loletha GrayerDPM  triamcinolone (NASACORT ALLERGY 24HR) 55 MCG/ACT AERO nasal inhaler Place 2 sprays into the nose daily as needed (allergies).     [provider]  warfarin (COUMADIN) 2.5 MG tablet TAKE 1 TO 1&1/2 TABLETS DAILY AS DIRECTED BY COUMADIN CLINIC. Patient taking differently: Take 2.5 mg by mouth See admin instructions. Takes 2.5 mg daily except Monday and  Friday. Takes 3.75 mgs on Mondays & Fridays. 05/10/19   JoMartiniquePeter M, MD    ALLERGIES:  Allergies  Allergen Reactions  . Codeine     NAUSEA  . Cymbalta [Duloxetine Hcl]     Nausea VOMITING AND ABDOMINAL PAIN, HEADACHE, JUST ABOUT EVERY SIDE EFFECT THE DRUG HAS  . Diltiazem Cd [Diltiazem Hcl Er Beads]     Heavy legs, cough  . Duloxetine     Other reaction(s): Other (See Comments) Nausea VOMITING AND ABDOMINAL PAIN, HEADACHE, JUST ABOUT EVERY SIDE EFFECT THE DRUG HAS  . Metoprolol     Legs like cement  . Nortriptyline     Nightmares  . Penicillins     RASH  & ITCHING   --PT STATES SHE CAN TAKE KEFLEX PO AND IV CEPHALOSPORINS    Tolerate IV ancef on 07/20/2019  .  Statins     Leg cramps  . Tetanus Toxoids     SWELLING, REDNESS  WHOLE ARM  . Ciprofloxacin Rash    RASH  . Lyrica [Pregabalin] Diarrhea, Nausea And Vomiting and Rash    SOCIAL HISTORY:  Social History   Tobacco Use  . Smoking status: Former Smoker    Packs/day: 0.50    Years: 15.00    Pack years: 7.50    Types: Cigarettes    Quit date: 02/11/1986    Years since quitting: 33.5  . Smokeless tobacco: Never Used  Substance Use Topics  . Alcohol use: Yes    Alcohol/week: 0.0 standard drinks    Comment: "an egg nog at Christmas"    FAMILY HISTORY: Family History  Problem Relation Age of Onset  . Heart disease Father        Heart Disease before age 17  . Heart attack Father 42  . Hypertension Father   . Peripheral vascular disease Father        Right  leg amputation  . Heart disease Mother        AAA  . Stroke Mother   . Aneurysm Mother   . Hypertension Mother   . AAA (abdominal aortic aneurysm) Mother   . Diabetes Paternal Grandfather   . Hypertension Brother   . Heart disease Brother        Heart Disease before age 41  . Hyperlipidemia Brother   . Heart attack Brother   . Hypertension Brother   . Diabetes Son     EXAM: BP (!) 172/79 (BP Location: Left Arm)   Pulse 94   Temp 97.9 F (36.6  C) (Oral)   Resp (!) 22   Ht 5' 2"  (1.575 m)   Wt 92.9 kg   SpO2 100%   BMI 37.46 kg/m  CONSTITUTIONAL: Alert and able to answer some questions appropriately.  Very hard of hearing.  In no distress currently.  Elderly.  Chronically ill-appearing. HEAD: Normocephalic EYES: Conjunctivae clear, pupils appear equal, EOM appear intact ENT: normal nose; moist mucous membranes NECK: Supple, normal ROM CARD: Irregularly irregular and rate controlled; S1 and S2 appreciated; no murmurs, no clicks, no rubs, no gallops RESP: Normal chest excursion without splinting or tachypnea; breath sounds clear and equal bilaterally; no wheezes, no rhonchi, no rales, no hypoxia or respiratory distress, speaking full sentences; diminished at bases bilaterally ABD/GI: Normal bowel sounds; non-distended; soft, non-tender, no rebound, no guarding, no peritoneal signs, no hepatosplenomegaly BACK:  The back appears normal EXT: Normal ROM in all joints; no deformity noted, no edema; no cyanosis, no calf tenderness or calf swelling SKIN: Normal color for age and race; warm; no rash on exposed skin NEURO: Moves all extremities equally PSYCH: The patient's mood and manner are appropriate.   MEDICAL DECISION MAKING: Patient here with shortness of breath.  Was found to be significantly hypoxic with EMS.  Tells me that she does not want oxygen at home but per nursing notes that she has it at home to wear as needed.  Hypertensive here.  Will recheck blood pressure.  She may need to be on IV medications.  Labs, chest x-ray pending.  She denies any chest pain.  EKG shows no new ischemic change.  Concern for CHF exacerbation.  Differential also includes pneumonia, pneumothorax, PE, ACS.  ED PROGRESS: Chest x-ray concerning for pulmonary edema and small bilateral pleural effusions consistent with CHF exacerbation.  BNP is 342.  Troponin is pending.  She denies any  chest pain.  Her INR is therapeutic making PE less likely.  Will give  IV Lasix and discuss with hospitalist for admission.  She is doing well currently on nasal cannula.   4:29 AM Discussed patient's case with hospitalist, Dr. Marice Potter.  I have recommended admission and patient (and family if present) agree with this plan. Admitting physician will place admission orders.   I reviewed all nursing notes, vitals, pertinent previous records and reviewed/interpreted all EKGs, lab and urine results, imaging (as available).      EKG Interpretation  Date/Time:  Sunday August 15 2019 00:19:05 EDT Ventricular Rate:  85 PR Interval:    QRS Duration: 89 QT Interval:  344 QTC Calculation: 409 R Axis:   101 Text Interpretation: Atrial fibrillation Right axis deviation Nonspecific repol abnormality, diffuse leads No significant change since last tracing Confirmed by Pryor Curia [phone]) on 08/15/2019 2:16:57 AM        Genia Del was evaluated in Emergency Department on 08/15/2019 for the symptoms described in the history of present illness. She was evaluated in the context of the global COVID-19 pandemic, which necessitated consideration that the patient might be at risk for infection with the SARS-CoV-2 virus that causes COVID-19. Institutional protocols and algorithms that pertain to the evaluation of patients at risk for COVID-19 are in a state of rapid change based on information released by regulatory bodies including the CDC and federal and state organizations. These policies and algorithms were followed during the patient's care in the ED.      Dagoberto Nealy, Delice Bison, DO 08/15/19 [phone]

## 2019-08-15 NOTE — H&P (Signed)
[facility] History and Physical  [name] AUQ:[id number] DOB: 12-15-1934 DOA: 08/15/2019  Referring EDP: [facility] PCP: Deland Pretty, MD   Chief Complaint: SOB  HPI: [name] is a 85 y.o. female with PMH of Gerd, PUD, hypertension, hyperlipidemia, Carotid stenosis, Pafib, CHF (diastolic), OSA on Cpap, h/o PE presents with SOB and admitted for CHF exacerbation.  Patient reports she had been at a rehab facility after shoulder surgery last month. About three days ago she began to notice weight gain (about 5 lbs) and worsening SOB. She requested CXR and increased Lasix but was discharged regardless and presents to ER today. She has also noticed fatigue and progressive dyspnea. Denies headache, dizziness, fever, chills, cough, chest pain, abdominal pain, nausea, vomiting, diarrhea, constipation, dysuria, hematuria, hematochezia, melena, speech difficulty, trouble eating, confusion or any other complaints. She does have a portable O2 tank at home but only uses sparingly per husband and never for long amounts of time.   In the ED: Hypertensive. Requires non-rebreather with even small exertion such as moving around in bed, otherwise on 5L O2 and vitals otherwise stable. Labs remarkable for Hgb 8.7, BMP WNL. INR 2.8. BNP 342. COVID and Trop pending.  CXR c/w CHF exacerbation.  Patient was given 40 mg IV Lasix and admission requested.  Review of Systems:  All other systems negative unless noted above in HPI.   Past Medical History:  Diagnosis Date  . Atrial fibrillation ([place])   . CAP (community acquired pneumonia) 12/27/2018  . Carotid artery occlusion   . Chronic diastolic CHF (congestive heart failure) (HCC)    Hypertensive heart disease 02-12-14  . Crohn's disease ([place])   . Detached retina   . Fibromyalgia   . GERD (gastroesophageal reflux disease)   . Gout   . H/O hiatal hernia   . High triglycerides   . History of stomach ulcers 1950's  . Hypertension   . Long term current  use of anticoagulant   . Obstructive sleep apnea on CPAP   . Osteoarthritis    PAIN AND OA LEF T HIP AND BOTH SHOULDERS ARE BONE ON BONE AND PAINFUL  . Peripheral vascular disease (HCC)    KNOWN RIGHT INTERNAL CAROTID ARTERY OCCLUSION (NO STROKE)  --40 TO 59% STENOSIS LEFT ICA-FOLLOWED BY DR. EARLY WITH DOPPLER STUDY EVERY 6 MONTHS  . Pulmonary embolism ([place]) 2011   a. Hx of PE in 02/2009 after R hip surgery, venous dopplers negative, long-term Coumadin.  [name] Kitchen PVC (premature ventricular contraction)    PT STATES HX OF PVC'S ON EKG  . Recurrent upper respiratory infection (URI)    BRONCHITIS FEB 2013--SLIGHT COUGH NON-PRODUCTIVE NOW  . Recurrent UTI (urinary tract infection)    "on daily medicine" (05/14/2012)  . Tinnitus   . Vertigo    Past Surgical History:  Procedure Laterality Date  . APPENDECTOMY  1950's  . BACK SURGERY  10/29/06   Central and foraminal decompression L3-L4, L4-L5, and L5-S1 with inspection of L4-L5 and L5-S1 disc on the right  . CARDIAC CATHETERIZATION  1970's   "maybe 2" (05/14/2012)  . CARDIAC CATHETERIZATION    . CATARACT EXTRACTION W/ INTRAOCULAR LENS IMPLANT Right 2009  . CHOLECYSTECTOMY  ~ 1988  . DECOMPRESSIVE LUMBAR LAMINECTOMY LEVEL 3  ~ 2002  . DILATION AND CURETTAGE OF UTERUS     "several; from miscarriages" (05/14/2012)  . EXCISION MORTON'S NEUROMA Right 05/20/00   "foot" (05/14/2012)  . EYE SURGERY Left 2014   Detached retina  . EYE SURGERY Left  August 23, 2013   Cataract  . FEMUR FRACTURE SURGERY Right 02/21/09   Open reduction internal fixation of right periprosthetic  femur fracture utilizing Zimmer cables times fiv  . FRACTURE SURGERY  2011   Right Femur Fx  . HAMMER TOE SURGERY Left 10/25/08   "toe next to big to" (05/14/2012)  . JOINT REPLACEMENT  2009   Right Hip replacement  . JOINT REPLACEMENT  2012   Right knee replacement  . JOINT REPLACEMENT  06/17/11   Left Hip replacement  . KNEE ARTHROSCOPY Right 07/26/05  . REPLACEMENT TOTAL KNEE Right  02/19/2010  . REVISION TOTAL SHOULDER TO REVERSE TOTAL SHOULDER Right 07/20/2019   Procedure: REVERSE TOTAL SHOULDER REPLACEMENT;  Surgeon: Nicholes Stairs, MD;  Location: [place];  Service: Orthopedics;  Laterality: Right;  . SPINE SURGERY    . [place]?  . TOTAL HIP ARTHROPLASTY Right 12/08/02   Osteonics total hip replacement  . TOTAL HIP ARTHROPLASTY  06/17/2011   Procedure: TOTAL HIP ARTHROPLASTY;  Surgeon: Gearlean Alf, MD;  Location: [facility];  Service: Orthopedics;  Laterality: Left;  . TOTAL SHOULDER ARTHROPLASTY Left 05/14/2012  . TOTAL SHOULDER ARTHROPLASTY Left 05/14/2012   Procedure: TOTAL SHOULDER ARTHROPLASTY;  Surgeon: Marin Shutter, MD;  Location: [place];  Service: Orthopedics;  Laterality: Left;  [name] Kitchen VAGINAL HYSTERECTOMY  1971   Social History:  reports that she quit smoking about 33 years ago. Her smoking use included cigarettes. She has a 7.50 pack-year smoking history. She has never used smokeless tobacco. She reports current alcohol use. She reports that she does not use drugs.  Allergies  Allergen Reactions  . Codeine     NAUSEA  . Cymbalta [Duloxetine Hcl]     Nausea VOMITING AND ABDOMINAL PAIN, HEADACHE, JUST ABOUT EVERY SIDE EFFECT THE DRUG HAS  . Diltiazem Cd [Diltiazem Hcl Er Beads]     Heavy legs, cough  . Duloxetine     Other reaction(s): Other (See Comments) Nausea VOMITING AND ABDOMINAL PAIN, HEADACHE, JUST ABOUT EVERY SIDE EFFECT THE DRUG HAS  . Metoprolol     Legs like cement  . Nortriptyline     Nightmares  . Penicillins     RASH  & ITCHING   --PT STATES SHE CAN TAKE KEFLEX PO AND IV CEPHALOSPORINS    Tolerate IV ancef on 07/20/2019  . Statins     Leg cramps  . Tetanus Toxoids     SWELLING, REDNESS  WHOLE ARM  . Ciprofloxacin Rash    RASH  . Lyrica [Pregabalin] Diarrhea, Nausea And Vomiting and Rash    Family History  Problem Relation Age of Onset  . Heart disease Father        Heart Disease before age 33  . Heart  attack Father 68  . Hypertension Father   . Peripheral vascular disease Father        Right  leg amputation  . Heart disease Mother        AAA  . Stroke Mother   . Aneurysm Mother   . Hypertension Mother   . AAA (abdominal aortic aneurysm) Mother   . Diabetes Paternal Grandfather   . Hypertension Brother   . Heart disease Brother        Heart Disease before age 77  . Hyperlipidemia Brother   . Heart attack Brother   . Hypertension Brother   . Diabetes Son     Prior to Admission medications   Medication Sig Start Date  End Date Taking? Authorizing Provider  allopurinol (ZYLOPRIM) 300 MG tablet Take 300 mg by mouth daily. 06/09/16   [provider]  amLODipine (NORVASC) 5 MG tablet Take 5 mg by mouth daily. 04/24/18   [provider]  benzonatate (TESSALON) 100 MG capsule Take 100 mg by mouth 3 (three) times daily as needed for cough.    [provider]  brimonidine (ALPHAGAN) 0.2 % ophthalmic solution Place 1 drop into the right eye 2 (two) times daily.  03/20/18   [provider]  cholecalciferol (VITAMIN D3) 25 MCG (1000 UNIT) tablet Take 1,000 Units by mouth daily.    [provider]  cimetidine (TAGAMET) 200 MG tablet Take 200 mg by mouth 2 (two) times daily.    [provider]  CRANBERRY CONCENTRATE PO Take 1 tablet by mouth daily.    [provider]  dorzolamide-timolol (COSOPT) 22.3-6.8 MG/ML ophthalmic solution Place 1 drop into both eyes 2 (two) times daily.  06/28/13   [provider]  furosemide (LASIX) 20 MG tablet Take 1 tab (69m)  daily for a week, then continue 1/2 tab (146m daily Patient taking differently: Take 20 mg by mouth daily.  12/31/18   Rai, RiVernelle EmeraldMD  HYDROcodone-acetaminophen (NORCO/VICODIN) 5-325 MG tablet Take 0.5-1 tablets by mouth every 6 (six) hours as needed for moderate pain.  06/10/18   [provider]  HYDROcodone-acetaminophen (NORCO/VICODIN) 5-325 MG tablet Take 1  tablet by mouth every 4 (four) hours as needed for moderate pain. 07/23/19 07/22/20  RoNicholes StairsMD  ipratropium (ATROVENT) 0.06 % nasal spray Place 1 spray into both nostrils daily as needed for rhinitis.  04/13/18   [provider]  loratadine (CLARITIN) 10 MG tablet Take 10 mg by mouth daily as needed for allergies.    [provider]  LUMIGAN 0.01 % SOLN Place 1 drop into both eyes at bedtime. 09/04/16   [provider]  mirtazapine (REMERON) 7.5 MG tablet Take 7.5 mg by mouth at bedtime. 06/17/19   [provider]  Multiple Vitamin (MULTIVITAMIN WITH MINERALS) TABS tablet Take 1 tablet by mouth daily.    [provider]  mupirocin ointment (BACTROBAN) 2 % Apply 1 application topically 2 (two) times daily. 04/22/17   WaTrula SladeDPM  nebivolol (BYSTOLIC) 10 MG tablet Take 1 tablet (10 mg total) by mouth at bedtime. 05/19/18   JoMartiniquePeter M, MD  potassium chloride (KLOR-CON) 10 MEQ tablet Take 2 tablets (20 mEq total) by mouth daily. 12/31/18 07/19/19  Rai, Ripudeep K, MD  RHOPRESSA 0.02 % SOLN Place 1 drop into the right eye at bedtime. 07/13/19   [provider]  silver sulfADIAZINE (SILVADENE) 1 % cream Apply 1 application topically daily. 12/12/15   Tuchman, Richard C,Loletha GrayerDPM  triamcinolone (NASACORT ALLERGY 24HR) 55 MCG/ACT AERO nasal inhaler Place 2 sprays into the nose daily as needed (allergies).     [provider]  warfarin (COUMADIN) 2.5 MG tablet TAKE 1 TO 1&1/2 TABLETS DAILY AS DIRECTED BY COUMADIN CLINIC. Patient taking differently: Take 2.5 mg by mouth See admin instructions. Takes 2.5 mg daily except Monday and Friday. Takes 3.75 mgs on Mondays & Fridays. 05/10/19   JoMartiniquePeter M, MD   Physical Exam: Vitals:   08/15/19 0020 08/15/19 0208 08/15/19 0314 08/15/19 0400  BP: (!) 172/79 (!) 151/127 (!) 106/96 (!) 158/81  Pulse: 94 78 73 90  Resp: (!) 22 17 20 14   Temp: 97.9 F (36.6 C)  TempSrc: Oral     SpO2:  100% 96% 97% 97%  Weight: 92.9 kg     Height: 5' 2"  (1.575 m)       Wt Readings from Last 3 Encounters:  08/15/19 92.9 kg  07/20/19 87.1 kg  04/19/19 91.6 kg    . General:  Appears ill and fatigued. AAOx4.  . Eyes: EOMI, normal lids, irises & conjunctiva . ENT: grossly normal hearing, lips & tongue . Neck: normal ROM . Cardiovascular: Irregularly irregular rhythm with normal rate, no m/r/g. Bilateral 2+ pitting LE edema. [name] Kitchen Respiratory: Bilateral rales at bases. Increased work of breathing using accessory muscles. . Abdomen: soft, ntnd . Skin: no rash or induration seen on limited exam . Musculoskeletal: grossly normal tone BUE/BLE . Psychiatric: grossly normal mood and affect, speech fluent and appropriate . Neurologic: grossly non-focal.          Labs on Admission:  Basic Metabolic Panel: Recent Labs  Lab 08/15/19 0306  NA 143  K 4.1  CL 107  CO2 31  GLUCOSE 128*  BUN 15  CREATININE 0.82  CALCIUM 8.9   Liver Function Tests: No results for input(s): AST, ALT, ALKPHOS, BILITOT, PROT, ALBUMIN in the last 168 hours. No results for input(s): LIPASE, AMYLASE in the last 168 hours. No results for input(s): AMMONIA in the last 168 hours. CBC: Recent Labs  Lab 08/15/19 0306  WBC 5.6  HGB 8.7*  HCT 30.1*  MCV 97.1  PLT 179   Cardiac Enzymes: No results for input(s): CKTOTAL, CKMB, CKMBINDEX, TROPONINI in the last 168 hours.  BNP (last 3 results) Recent Labs    12/27/18 0340 08/15/19 0307  BNP 300.3* 342.7*    ProBNP (last 3 results) No results for input(s): PROBNP in the last 8760 hours.  CBG: No results for input(s): GLUCAP in the last 168 hours.  Radiological Exams on Admission: DG Chest 2 View  Result Date: 08/15/2019 CLINICAL DATA:  Awoke from sleep with shortness of breath. EXAM: CHEST - 2 VIEW COMPARISON:  Radiograph 12/27/2018.  CT 07/13/2018 FINDINGS: Cardiomegaly. Diffuse interstitial and peribronchial thickening suspicious for pulmonary edema.  Small bilateral pleural effusions and hazy lung base opacities. No pneumothorax. Bilateral shoulder arthroplasties. IMPRESSION: Cardiomegaly with pulmonary edema and small bilateral pleural effusions. Findings most consistent with CHF. Electronically Signed   By: Keith Rake M.D.   On: 08/15/2019 02:38    EKG: Independently reviewed. HR 85. Irregularly irregular rhythm. QTc 489. No STEMI. A fib. No change from prior.   Assessment/Plan Principal Problem:   Acute exacerbation of CHF (congestive heart failure) (HCC) Active Problems:   Gout   OA (osteoarthritis) of hip   Normocytic anemia   Chronic diastolic CHF (congestive heart failure) (HCC)   Morbid obesity (HCC)   OSA on CPAP   Essential hypertension   Chronic atrial fibrillation (HCC)   Acute on chronic diastolic (congestive) heart failure (HCC)   Gastroesophageal reflux disease without esophagitis  85 y.o. female with PMH of Gerd, PUD, hypertension, hyperlipidemia, Carotid stenosis, Pafib, CHF (diastolic), OSA on Cpap, h/o PE presents with SOB and admitted for CHF exacerbation.  Acute respiratory failure with hypoxia in the setting of acute on chronic diastolic CHF, POA -SOB N1-7 days with weight gain, increased LE edema and progressive dyspnea -requiring 5L O2 on admission and non-rebreather for moving her in bed -Last Echo: June 2020: Normal EF, diastolic HF -Will not repeat Echo at this time; consider if clinical status worsens -s/p Lasix 40 mg IV in ED;  will cont Lasix 40 mg IV BID x 2 days (home dose 20 mg daily) -Tele -Consult Cards PRN -Daily weights -I's and O's -Daily K and Mag; Mag ordered on admission -On Bystolic; unclear reason for no ACE/ARB -F/u Trop -PT/OT  Permanent atrial fibrillation  -Cont Warfarin; INR at goal on admission -Daily INR -Tele  Hypertension -continue amlodipine, Bystolic  Normocytic Anemia -Hgb 8.7 on admission; baseline -Iron panel ordered -Consider IV iron pending results  as iron deficiency associated with worse outcomes in CHF patients   Gout -Continue allopurinol  GERD -Continue home equivalent  OSA -Continue CPAP  Code Status: Full DVT Prophylaxis: Home Warfarin Family Communication: Husband at bedside Disposition Plan: Admit to inpatient. Patient in acute respiratory failure and requires IV medication and further monitoring. Patient is at high risk for further decompensation due to age and co-morbidities. Anticipate discharge home in 3-4 days.   Time spent: 70 minutes  Chauncey Mann, MD [facility] Pager [phone]

## 2019-08-15 NOTE — ED Triage Notes (Signed)
Pt arrives EMS from home with complaints of shortness of breath. Pt woke up from her sleep unable to breath.sats @ 76% upon EMS arrival on RA. [place] placed. Pt has 2 at home for prn use. Pt denies chest pain, N/V/D. Pt recently saw PCP who increased lasix. Pt has not missed any doses of her meds but has not started the increased dose of lasix yet.

## 2019-08-16 ENCOUNTER — Inpatient Hospital Stay (HOSPITAL_COMMUNITY): Payer: Medicare Other

## 2019-08-16 LAB — CBC WITH DIFFERENTIAL/PLATELET
Abs Immature Granulocytes: 0.04 10*3/uL (ref 0.00–0.07)
Basophils Absolute: 0 10*3/uL (ref 0.0–0.1)
Basophils Relative: 1 %
Eosinophils Absolute: 0.2 10*3/uL (ref 0.0–0.5)
Eosinophils Relative: 4 %
HCT: 26.6 % — ABNORMAL LOW (ref 36.0–46.0)
Hemoglobin: 7.6 g/dL — ABNORMAL LOW (ref 12.0–15.0)
Immature Granulocytes: 1 %
Lymphocytes Relative: 19 %
Lymphs Abs: 0.8 10*3/uL (ref 0.7–4.0)
MCH: 27.4 pg (ref 26.0–34.0)
MCHC: 28.6 g/dL — ABNORMAL LOW (ref 30.0–36.0)
MCV: 96 fL (ref 80.0–100.0)
Monocytes Absolute: 0.3 10*3/uL (ref 0.1–1.0)
Monocytes Relative: 8 %
Neutro Abs: 2.8 10*3/uL (ref 1.7–7.7)
Neutrophils Relative %: 67 %
Platelets: 149 10*3/uL — ABNORMAL LOW (ref 150–400)
RBC: 2.77 MIL/uL — ABNORMAL LOW (ref 3.87–5.11)
RDW: 18 % — ABNORMAL HIGH (ref 11.5–15.5)
WBC: 4.1 10*3/uL (ref 4.0–10.5)
nRBC: 0 % (ref 0.0–0.2)

## 2019-08-16 LAB — BASIC METABOLIC PANEL
Anion gap: 9 (ref 5–15)
BUN: 17 mg/dL (ref 8–23)
CO2: 33 mmol/L — ABNORMAL HIGH (ref 22–32)
Calcium: 8.3 mg/dL — ABNORMAL LOW (ref 8.9–10.3)
Chloride: 101 mmol/L (ref 98–111)
Creatinine, Ser: 0.82 mg/dL (ref 0.44–1.00)
GFR calc Af Amer: >60 mL/min (ref >60)
GFR calc non Af Amer: >60 mL/min (ref >60)
Glucose, Bld: 113 mg/dL — ABNORMAL HIGH (ref 70–99)
Potassium: 3.3 mmol/L — ABNORMAL LOW (ref 3.5–5.1)
Sodium: 143 mmol/L (ref 135–145)

## 2019-08-16 LAB — PROTIME-INR
INR: 3.2 — ABNORMAL HIGH (ref 0.8–1.2)
Prothrombin Time: 31.8 seconds — ABNORMAL HIGH (ref 11.4–15.2)

## 2019-08-16 LAB — HEMOGLOBIN AND HEMATOCRIT, BLOOD
HCT: 26.8 % — ABNORMAL LOW (ref 36.0–46.0)
Hemoglobin: 7.6 g/dL — ABNORMAL LOW (ref 12.0–15.0)

## 2019-08-16 LAB — FOLATE: Folate: 25.4 ng/mL (ref >5.9)

## 2019-08-16 LAB — TYPE AND SCREEN
ABO/RH(D): A POS
Antibody Screen: NEGATIVE

## 2019-08-16 LAB — MAGNESIUM: Magnesium: 1.6 mg/dL — ABNORMAL LOW (ref 1.7–2.4)

## 2019-08-16 MED ORDER — WARFARIN - PHARMACIST DOSING INPATIENT: Freq: Every day | Status: DC

## 2019-08-16 MED ORDER — WARFARIN SODIUM 1 MG PO TABS
1.0000 mg | ORAL_TABLET | Freq: Once | ORAL | Status: AC
  Administered 2019-08-16: 1 mg via ORAL
  Filled 2019-08-16: qty 1

## 2019-08-16 MED ORDER — LATANOPROST 0.005 % OP SOLN
1.0000 [drp] | Freq: Every day | OPHTHALMIC | Status: DC
  Administered 2019-08-16 – 2019-08-17 (×2): 1 [drp] via OPHTHALMIC
  Filled 2019-08-16: qty 2.5

## 2019-08-16 MED ORDER — MAGNESIUM SULFATE 2 GM/50ML IV SOLN
2.0000 g | Freq: Once | INTRAVENOUS | Status: AC
  Administered 2019-08-16: 2 g via INTRAVENOUS
  Filled 2019-08-16: qty 50

## 2019-08-16 MED ORDER — SODIUM CHLORIDE 0.9 % IV SOLN
INTRAVENOUS | Status: DC | PRN
  Administered 2019-08-16: 250 mL via INTRAVENOUS

## 2019-08-16 MED ORDER — POTASSIUM CHLORIDE CRYS ER 20 MEQ PO TBCR
40.0000 meq | EXTENDED_RELEASE_TABLET | ORAL | Status: DC
  Filled 2019-08-16 (×2): qty 2

## 2019-08-16 MED ORDER — WARFARIN SODIUM 1 MG PO TABS
1.0000 mg | ORAL_TABLET | Freq: Once | ORAL | Status: DC
  Filled 2019-08-16: qty 1

## 2019-08-16 MED ORDER — POTASSIUM CHLORIDE CRYS ER 20 MEQ PO TBCR
40.0000 meq | EXTENDED_RELEASE_TABLET | ORAL | Status: AC
  Administered 2019-08-16 (×2): 40 meq via ORAL

## 2019-08-16 NOTE — Evaluation (Signed)
Clinical/Bedside Swallow Evaluation Patient Details  Name: [name] MRN: [id number] Date of Birth: [birth year]  Today's Date: 08/16/2019 Time: SLP Start Time (ACUTE ONLY): 0858 SLP Stop Time (ACUTE ONLY): 0915 SLP Time Calculation (min) (ACUTE ONLY): 17 min  Past Medical History:  Past Medical History:  Diagnosis Date  . Atrial fibrillation ([place])   . CAP (community acquired pneumonia) 12/27/2018  . Carotid artery occlusion   . Chronic diastolic CHF (congestive heart failure) (HCC)    Hypertensive heart disease 02-12-14  . Crohn's disease ([place])   . Detached retina   . Fibromyalgia   . GERD (gastroesophageal reflux disease)   . Gout   . H/O hiatal hernia   . High triglycerides   . History of stomach ulcers 1950's  . Hypertension   . Long term current use of anticoagulant   . Obstructive sleep apnea on CPAP   . Osteoarthritis    PAIN AND OA LEF T HIP AND BOTH SHOULDERS ARE BONE ON BONE AND PAINFUL  . Peripheral vascular disease (HCC)    KNOWN RIGHT INTERNAL CAROTID ARTERY OCCLUSION (NO STROKE)  --40 TO 59% STENOSIS LEFT ICA-FOLLOWED BY DR. EARLY WITH DOPPLER STUDY EVERY 6 MONTHS  . Pulmonary embolism ([place]) 2011   a. Hx of PE in 02/2009 after R hip surgery, venous dopplers negative, long-term Coumadin.  [name] Kitchen PVC (premature ventricular contraction)    PT STATES HX OF PVC'S ON EKG  . Recurrent upper respiratory infection (URI)    BRONCHITIS FEB 2013--SLIGHT COUGH NON-PRODUCTIVE NOW  . Recurrent UTI (urinary tract infection)    "on daily medicine" (05/14/2012)  . Tinnitus   . Vertigo    Past Surgical History:  Past Surgical History:  Procedure Laterality Date  . APPENDECTOMY  1950's  . BACK SURGERY  10/29/06   Central and foraminal decompression L3-L4, L4-L5, and L5-S1 with inspection of L4-L5 and L5-S1 disc on the right  . CARDIAC CATHETERIZATION  1970's   "maybe 2" (05/14/2012)  . CARDIAC CATHETERIZATION    . CATARACT EXTRACTION W/ INTRAOCULAR LENS IMPLANT Right 2009  .  CHOLECYSTECTOMY  ~ 1988  . DECOMPRESSIVE LUMBAR LAMINECTOMY LEVEL 3  ~ 2002  . DILATION AND CURETTAGE OF UTERUS     "several; from miscarriages" (05/14/2012)  . EXCISION MORTON'S NEUROMA Right 05/20/00   "foot" (05/14/2012)  . EYE SURGERY Left 2014   Detached retina  . EYE SURGERY Left August 23, 2013   Cataract  . FEMUR FRACTURE SURGERY Right 02/21/09   Open reduction internal fixation of right periprosthetic  femur fracture utilizing Zimmer cables times fiv  . FRACTURE SURGERY  2011   Right Femur Fx  . HAMMER TOE SURGERY Left 10/25/08   "toe next to big to" (05/14/2012)  . JOINT REPLACEMENT  2009   Right Hip replacement  . JOINT REPLACEMENT  2012   Right knee replacement  . JOINT REPLACEMENT  06/17/11   Left Hip replacement  . KNEE ARTHROSCOPY Right 07/26/05  . REPLACEMENT TOTAL KNEE Right 02/19/2010  . REVISION TOTAL SHOULDER TO REVERSE TOTAL SHOULDER Right 07/20/2019   Procedure: REVERSE TOTAL SHOULDER REPLACEMENT;  Surgeon: Nicholes Stairs, MD;  Location: [place];  Service: Orthopedics;  Laterality: Right;  . SPINE SURGERY    . [place]?  . TOTAL HIP ARTHROPLASTY Right 12/08/02   Osteonics total hip replacement  . TOTAL HIP ARTHROPLASTY  06/17/2011   Procedure: TOTAL HIP ARTHROPLASTY;  Surgeon: Gearlean Alf, MD;  Location[facility];  Service: Orthopedics;  Laterality: Left;  . TOTAL SHOULDER ARTHROPLASTY Left 05/14/2012  . TOTAL SHOULDER ARTHROPLASTY Left 05/14/2012   Procedure: TOTAL SHOULDER ARTHROPLASTY;  Surgeon: Marin Shutter, MD;  Location: [place];  Service: Orthopedics;  Laterality: Left;  [name] Kitchen VAGINAL HYSTERECTOMY  1971   HPI:  Pt is an 85 y.o. female with PMH of GERD, PUD, hypertension, hyperlipidemia, Carotid stenosis, Pafib, CHF (diastolic), OSA, h/o PE who presented with SOB and was admitted for CHF exacerbation. CXR 7/4: Cardiomegaly with pulmonary edema and small bilateral pleural effusions. Findings most consistent with CHF. WBC WNL. Pt is an 85 y.o.  female with PMH of GERD, PUD, hypertension, hyperlipidemia, Carotid stenosis, Pafib, CHF (diastolic), OSA, h/o PE who presented with SOB and was admitted for CHF exacerbation. CXR 7/4: Cardiomegaly with pulmonary edema and small bilateral pleural effusions. Findings most consistent with CHF. WBC WNL.    Assessment / Plan / Recommendation Clinical Impression  Pt was seen for bedside swallow evaluation. Pt reported that for the past month she has been coughing with water (but to a lesser extent with other liquids) and has had increasing reflux. Oral mechanism exam was [facility] and dentition was adequate. She demonstrated symptoms of pharyngeal dysphagia characterized by multiple swallows, and signs of aspiration with puree and regular texture solids. A modified barium swallow study is recommended at this time and is scheduled for today at 10:30.  SLP Visit Diagnosis: Dysphagia, pharyngeal phase (R13.13)    Aspiration Risk  Mild aspiration risk    Diet Recommendation Regular;Thin liquid   Liquid Administration via: Cup;Straw Medication Administration: Whole meds with puree Supervision: Patient able to self feed Compensations: Slow rate;Small sips/bites Postural Changes: Seated upright at 90 degrees    Other  Recommendations Oral Care Recommendations: Oral care BID   Follow up Recommendations  (TBD)      Frequency and Duration min 2x/week  1 week       Prognosis        Swallow Study   General Date of Onset: 08/15/19 HPI: Pt is an 85 y.o. female with PMH of GERD, PUD, hypertension, hyperlipidemia, Carotid stenosis, Pafib, CHF (diastolic), OSA, h/o PE who presented with SOB and was admitted for CHF exacerbation. CXR 7/4: Cardiomegaly with pulmonary edema and small bilateral pleural effusions. Findings most consistent with CHF. WBC WNL. Pt is an 85 y.o. female with PMH of GERD, PUD, hypertension, hyperlipidemia, Carotid stenosis, Pafib, CHF (diastolic), OSA, h/o PE who presented with SOB and  was admitted for CHF exacerbation. CXR 7/4: Cardiomegaly with pulmonary edema and small bilateral pleural effusions. Findings most consistent with CHF. WBC WNL.  Type of Study: Bedside Swallow Evaluation Previous Swallow Assessment: None Diet Prior to this Study: Regular;Thin liquids Temperature Spikes Noted: No Respiratory Status: Nasal cannula History of Recent Intubation: No Behavior/Cognition: Alert;Pleasant mood;Cooperative Oral Cavity Assessment: Within Functional Limits Oral Care Completed by SLP: No Oral Cavity - Dentition: Adequate natural dentition Vision: Functional for self-feeding Self-Feeding Abilities: Able to feed self Patient Positioning: Upright in bed Baseline Vocal Quality: Normal Volitional Cough: Strong Volitional Swallow: Able to elicit    Oral/Motor/Sensory Function Overall Oral Motor/Sensory Function: Within functional limits   Ice Chips Ice chips: Not tested   Thin Liquid Thin Liquid: Within functional limits Presentation: Cup;Straw    Nectar Thick Nectar Thick Liquid: Not tested   Honey Thick Honey Thick Liquid: Not tested   Puree Puree: Impaired Presentation: Spoon Pharyngeal Phase Impairments: Throat Clearing - Immediate;Cough - Delayed;Multiple swallows   Solid     Solid:  Impaired Pharyngeal Phase Impairments: Throat Clearing - Immediate;Cough - Immediate;Multiple swallows     Jamel Dunton I. Hardin Negus, [place], [place] Office number [phone] Pager [place] 08/16/2019,10:16 AM

## 2019-08-16 NOTE — Progress Notes (Signed)
Modified Barium Swallow Progress Note  Patient Details  Name: [name] MRN: [id number] Date of Birth: 1934-04-15  Today's Date: 08/16/2019  Modified Barium Swallow completed.  Full report located under Chart Review in the Imaging Section.  Brief recommendations include the following:  Clinical Impression  Pt was seen in radiology suite for modified barium swallow study. Trials of puree solids, regular texture solids, a 65m barium tablet, and thin liquids via cup and straw were administered. Pt's oropharyngeal swallow mechanism was within functional limits. Esophageal screening revealed retention of boluses in the upper thoracic esophagus with delayed transport to the stomach. Backflow of this material to the pharynx was not observed during the study. However, considering the pt's report of increased reflux and increased signs of aspiration, the possibility of backflow of boluses retained in the thoracic esophagus being aspirated is strongly considered. It is recommended that the current diet be continued with GERD and swallowing precautions. Pt was advised to follow up with her gastroenterologist regarding her symptoms and she verbalized agreement with this recommendation.    Swallow Evaluation Recommendations       SLP Diet Recommendations: Regular solids;Thin liquid   Liquid Administration via: Cup;Straw   Medication Administration: Whole meds with liquid   Supervision: Patient able to self feed   Compensations: Slow rate;Small sips/bites;Follow solids with liquid   Postural Changes: Remain semi-upright after after feeds/meals (Comment)   Oral Care Recommendations: Oral care BID      Ahana Najera I. PHardin Negus MAvenue B and C CHazletonOffice number [phone] [phone] SHorton Marshall7/06/2019,12:37 PM

## 2019-08-16 NOTE — Progress Notes (Addendum)
ANTICOAGULATION CONSULT NOTE - Initial Consult  Pharmacy Consult for Warfarin Indication: atrial fibrillation  Allergies  Allergen Reactions  . Codeine     NAUSEA  . Cymbalta [Duloxetine Hcl]     Nausea VOMITING AND ABDOMINAL PAIN, HEADACHE, JUST ABOUT EVERY SIDE EFFECT THE DRUG HAS  . Diltiazem Cd [Diltiazem Hcl Er Beads]     Heavy legs, cough  . Duloxetine     Other reaction(s): Other (See Comments) Nausea VOMITING AND ABDOMINAL PAIN, HEADACHE, JUST ABOUT EVERY SIDE EFFECT THE DRUG HAS  . Metoprolol     Legs like cement  . Mirtazapine Other (See Comments)    nightmares  . Nortriptyline     Nightmares  . Penicillins     RASH  & ITCHING   --PT STATES SHE CAN TAKE KEFLEX PO AND IV CEPHALOSPORINS    Tolerate IV ancef on 07/20/2019  . Statins     Leg cramps  . Tetanus Toxoids     SWELLING, REDNESS  WHOLE ARM  . Ciprofloxacin Rash    RASH  . Lyrica [Pregabalin] Diarrhea, Nausea And Vomiting and Rash    Patient Measurements: Height: 5' 2"  (157.5 cm) Weight: 89 kg (196 lb 3.4 oz) IBW/kg (Calculated) : 50.1  Vital Signs: Temp: 98.1 F (36.7 C) (07/05 1101) Temp Source: Oral (07/05 1101) BP: 97/72 (07/05 1400) Pulse Rate: 68 (07/05 1400)  Labs: Recent Labs    08/15/19 0306 08/15/19 0306 08/15/19 0727 08/16/19 0433 08/16/19 1532  HGB 8.7*   < >  --  7.6* 7.6*  HCT 30.1*  --   --  26.6* 26.8*  PLT 179  --   --  149*  --   LABPROT 28.4*  --   --  31.8*  --   INR 2.8*  --   --  3.2*  --   CREATININE 0.82  --   --  0.82  --   TROPONINIHS  --   --  7  --   --    < > = values in this interval not displayed.    Estimated Creatinine Clearance: 53.9 mL/min (by C-G formula based on SCr of 0.82 mg/dL).   Medical History: Past Medical History:  Diagnosis Date  . Atrial fibrillation ([place])   . CAP (community acquired pneumonia) 12/27/2018  . Carotid artery occlusion   . Chronic diastolic CHF (congestive heart failure) (HCC)    Hypertensive heart disease 02-12-14  .  Crohn's disease ([place])   . Detached retina   . Fibromyalgia   . GERD (gastroesophageal reflux disease)   . Gout   . H/O hiatal hernia   . High triglycerides   . History of stomach ulcers 1950's  . Hypertension   . Long term current use of anticoagulant   . Obstructive sleep apnea on CPAP   . Osteoarthritis    PAIN AND OA LEF T HIP AND BOTH SHOULDERS ARE BONE ON BONE AND PAINFUL  . Peripheral vascular disease (HCC)    KNOWN RIGHT INTERNAL CAROTID ARTERY OCCLUSION (NO STROKE)  --40 TO 59% STENOSIS LEFT ICA-FOLLOWED BY DR. EARLY WITH DOPPLER STUDY EVERY 6 MONTHS  . Pulmonary embolism ([place]) 2011   a. Hx of PE in 02/2009 after R hip surgery, venous dopplers negative, long-term Coumadin.  [name] Kitchen PVC (premature ventricular contraction)    PT STATES HX OF PVC'S ON EKG  . Recurrent upper respiratory infection (URI)    BRONCHITIS FEB 2013--SLIGHT COUGH NON-PRODUCTIVE NOW  . Recurrent UTI (urinary tract infection)    "  on daily medicine" (05/14/2012)  . Tinnitus   . Vertigo     Assessment: 85 yr old woman with PMH of GERD, PUD, HTN, HLD, carotid stenosis, atrial fibrillation, diastolic HF, OSA, hx PE presented on 08/15/19 with SOB and was admitted for [facility] exacerbation.  Pt was on warfarin PTA for atrial fibrillation: home warfarin regimen: 2.5 mg po daily, except for 3.75 mg po on Monday, Friday (per med rec, pt reported taking 3 mg dose on 08/14/19). INR on admission was 2.8. Pt's home regimen was ordered by provider on admission; pt rec'd warfarin 2.5 mg X 1 last evening. INR this AM was 3.2. H/H 7.6/26.8, platelets 149; no bleeding issues reported.  Goal of Therapy:  INR 2-3 Monitor platelets by anticoagulation protocol: Yes   Plan:  Warfarin 1 mg PO X 1 this evening Monitor daily INR, CBC Monitor for signs/symptoms of bleeding  Gillermina Hu, PharmD, BCPS, [facility] Clinical Pharmacist 08/16/2019,5:37 PM

## 2019-08-16 NOTE — Progress Notes (Signed)
PROGRESS NOTE    [name]  JSE:[id number] DOB: [birth year] DOA: 08/15/2019 PCP: Deland Pretty, MD   Chief Complaint  Patient presents with   Shortness of Breath    Brief Narrative: [name] is Moyses Pavey 85 y.o. female with PMH of Gerd, PUD, hypertension, hyperlipidemia, Carotid stenosis, Pafib, CHF (diastolic), OSA on Cpap, h/o PE presents with SOB and admitted for CHF exacerbation.  Patient reports [facility] had [facility] shoulder surgery last month. About three days ago she began to notice weight gain (about 5 lbs) and worsening SOB. She requested CXR and increased Lasix but was discharged regardless and presents to ER today. She has also noticed fatigue and progressive dyspnea. Denies headache, dizziness, fever, chills, cough, chest pain, abdominal pain, nausea, vomiting, diarrhea, constipation, dysuria, hematuria, hematochezia, melena, speech difficulty, trouble eating, confusion or any other complaints. She does have Osmar Howton portable O2 tank at home but only uses sparingly per husband and never for long amounts of time.   In the ED: Hypertensive. Requires non-rebreather with even small exertion such as moving around in bed, otherwise on 5L O2 and vitals otherwise stable. Labs remarkable for Hgb 8.7, BMP WNL. INR 2.8. BNP 342. COVID and Trop pending.  CXR c/w CHF exacerbation.  Patient was given 40 mg IV Lasix and admission requested.  Assessment & Plan:   Principal Problem:   Acute exacerbation of CHF (congestive heart failure) (HCC) Active Problems:   Gout   OA (osteoarthritis) of hip   Normocytic anemia   Chronic diastolic CHF (congestive heart failure) (HCC)   Morbid obesity (HCC)   OSA on CPAP   Essential hypertension   Chronic atrial fibrillation (HCC)   Acute on chronic diastolic (congestive) heart failure (HCC)   Gastroesophageal reflux disease without esophagitis  85 y.o. female with PMH of Gerd, PUD, hypertension, hyperlipidemia, Carotid stenosis,  Pafib, CHF (diastolic), OSA on Cpap, h/o PE presents with SOB and admitted for CHF exacerbation.  Acute respiratory failure with hypoxia in the setting of acute on chronic diastolic CHF, POA -SOB W7-3 days with weight gain, increased LE edema and progressive dyspnea -requiring 5L O2 on admission and non-rebreather for moving her in bed -Last Echo: June 2020: Normal EF, diastolic HF - repeat echo with EF 65-70%, indeterminate diastolic parameters (see report) - continue lasix 40 IV BID  - she notes her dry weight at home is ~200 lbs (? If she has new dry weight) - repeat CXR 7/5 - I/O, daily weights - continue telemetry  - Continue bystolic  - elevated BNP, troponin wnl - therapy  Normocytic Anemia -Hgb 8.7 on admission; baseline -normal iron, ferritin.  Follow B12, folate (both wnl) - Hb downtrending to 7.6 today, follow afternoon H/H, type and screen (discussed risk/benefits of transfusion, pt agreeable if needed)  Permanent atrial fibrillation  -Cont Warfarin; INR at goal on admission  -Daily INR (elevated to 3.2 today) - dosing per pharmacy -Tele  Dysphagia: follow SLP recs, MBS pending  Hypertension -continue amlodipine, Bystolic  Gout -Continue allopurinol  GERD -Continue home equivalent  OSA -Continue CPAP  Hypomagnesemia   Hypokalemia: replace and follow   Displaced and comminuted proximal humerus fracture s/p reverse shoulder arthroplasty - she was NWB to RUE at time of discharge (6/11) - she's followed up with Dr. Stann Mainland since then, will touch base to see if updated precautions - therapy   DVT prophylaxis: warfarin Code Status: full Family Communication: son and husband at bedside Disposition:   Status  is: Inpatient  Remains inpatient appropriate because:Inpatient level of care appropriate due to severity of illness   Dispo: The patient is from: Home              Anticipated d/c is to: Home              Anticipated d/c date is: > 3 days               Patient currently is not medically stable to d/c.  Consultants:   none  Procedures:  none  Antimicrobials:  Anti-infectives (From admission, onward)   None     Subjective: Persistent SOB, weakness  Objective: Vitals:   08/16/19 0031 08/16/19 0032 08/16/19 0259 08/16/19 0812  BP: (!) 117/55  (!) 105/55 (!) 146/100  Pulse: 81  82 60  Resp: 16  15 20   Temp: 97.7 F (36.5 C)  97.7 F (36.5 C)   TempSrc: Oral  Oral   SpO2: 92%  95%   Weight:  89 kg    Height:        Intake/Output Summary (Last 24 hours) at 08/16/2019 1036 Last data filed at 08/16/2019 0811 Gross per 24 hour  Intake 800 ml  Output 1150 ml  Net -350 ml   Filed Weights   08/15/19 0020 08/15/19 0604 08/16/19 0032  Weight: 92.9 kg 91 kg 89 kg    Examination:  General: No acute distress. Cardiovascular: Heart sounds show Zykiria Bruening regular rate, and rhythm. Lungs: Clear to auscultation bilaterally Abdomen: Soft, nontender, nondistended Neurological: Alert and oriented 3. Moves all extremities 4. Cranial nerves II through XII grossly intact. Skin: Warm and dry. No rashes or lesions. Extremities: No clubbing or cyanosis. Trace edema    Data Reviewed: I have personally reviewed following labs and imaging studies  CBC: Recent Labs  Lab 08/15/19 0306 08/16/19 0433  WBC 5.6 4.1  NEUTROABS  --  2.8  HGB 8.7* 7.6*  HCT 30.1* 26.6*  MCV 97.1 96.0  PLT 179 149*    Basic Metabolic Panel: Recent Labs  Lab 08/15/19 0306 08/15/19 0727 08/16/19 0433  NA 143  --  143  K 4.1  --  3.3*  CL 107  --  101  CO2 31  --  33*  GLUCOSE 128*  --  113*  BUN 15  --  17  CREATININE 0.82  --  0.82  CALCIUM 8.9  --  8.3*  MG  --  1.5* 1.6*    GFR: Estimated Creatinine Clearance: 53.9 mL/min (by C-G formula based on SCr of 0.82 mg/dL).  Liver Function Tests: No results for input(s): AST, ALT, ALKPHOS, BILITOT, PROT, ALBUMIN in the last 168 hours.  CBG: No results for input(s): GLUCAP in the last 168  hours.   Recent Results (from the past 240 hour(s))  SARS Coronavirus 2 by RT PCR (hospital order, performed in [facility] hospital lab) Nasopharyngeal Nasopharyngeal Swab     Status: None   Collection Time: 08/15/19  3:56 AM   Specimen: Nasopharyngeal Swab  Result Value Ref Range Status   SARS Coronavirus 2 NEGATIVE NEGATIVE Final    Comment: (NOTE) SARS-CoV-2 target nucleic acids are NOT DETECTED.  The SARS-CoV-2 RNA is generally detectable in upper and lower respiratory specimens during the acute phase of infection. The lowest concentration of SARS-CoV-2 viral copies this assay can detect is 250 copies / mL. Lavaughn Bisig negative result does not preclude SARS-CoV-2 infection and should not be used as the sole basis for treatment or other  patient management decisions.  Floyde Dingley negative result may occur with improper specimen collection / handling, submission of specimen other than nasopharyngeal swab, presence of viral mutation(s) within the areas targeted by this assay, and inadequate number of viral copies (<250 copies / mL). Kristain Hu negative result must be combined with clinical observations, patient history, and epidemiological information.  Fact Sheet for Patients:   StrictlyIdeas.no  Fact Sheet for Healthcare Providers: BankingDealers.co.za  This test is not yet approved or  cleared by the [place] FDA and has been authorized for detection and/or diagnosis of SARS-CoV-2 by FDA under an Emergency Use Authorization (EUA).  This EUA will remain in effect (meaning this test can be used) for the duration of the COVID-19 declaration under Section 564(b)(1) of the Act, 21 U.S.C. section 360bbb-3(b)(1), unless the authorization is terminated or revoked sooner.  Performed at [facility], [place] [address], [place],  [address]          Radiology Studies: DG Chest 2 View  Result Date: 08/15/2019 CLINICAL DATA:  Awoke from sleep  with shortness of breath. EXAM: CHEST - 2 VIEW COMPARISON:  Radiograph 12/27/2018.  CT 07/13/2018 FINDINGS: Cardiomegaly. Diffuse interstitial and peribronchial thickening suspicious for pulmonary edema. Small bilateral pleural effusions and hazy lung base opacities. No pneumothorax. Bilateral shoulder arthroplasties. IMPRESSION: Cardiomegaly with pulmonary edema and small bilateral pleural effusions. Findings most consistent with CHF. Electronically Signed   By: Keith Rake M.D.   On: 08/15/2019 02:38   ECHOCARDIOGRAM COMPLETE  Result Date: 08/15/2019    ECHOCARDIOGRAM REPORT   Patient Name:   [name] Date of Exam: 08/15/2019 Medical Rec #:  [id number]     Height:       62.0 in Accession #:    [id number]    Weight:       200.6 lb Date of Birth:  [birth year]     BSA:          1.914 m Patient Age:    69 years      BP:           118/43 mmHg Patient Gender: F             HR:           69 bpm. Exam Location:  Inpatient Procedure: 2D Echo, Cardiac Doppler and Color Doppler Indications:    Heart Failure  History:        Patient has prior history of Echocardiogram examinations, most                 recent 07/14/2018. CHF, Arrythmias:Atrial Fibrillation and PVC;                 Risk Factors:Dyslipidemia and Hypertension. Morbid obesity, OSA                 on CPAP, H/o recurrent pulmonary embolism, on Coumadin.  Sonographer:    Alvino Chapel RCS Referring Phys: [phone] Ellianne Gowen CALDWELL [place]  1. Left ventricular ejection fraction, by estimation, is 65 to 70%. The left ventricle has normal function. The left ventricle has no regional wall motion abnormalities. There is mild left ventricular hypertrophy. Left ventricular diastolic parameters are indeterminate.  2. Right ventricular systolic function is normal. The right ventricular size is normal. There is normal pulmonary artery systolic pressure. The estimated right ventricular systolic pressure is 03.5 mmHg.  3. The mitral valve is abnormal, mild to  moderate annular calcification. Mild mitral valve regurgitation.  4. The aortic  valve is tricuspid. Aortic valve regurgitation is not visualized.  5. The inferior vena cava is dilated in size with >50% respiratory variability, suggesting right atrial pressure of 8 mmHg.  6. Promiment circumferential pericardial fat pad. FINDINGS  Left Ventricle: Left ventricular ejection fraction, by estimation, is 65 to 70%. The left ventricle has normal function. The left ventricle has no regional wall motion abnormalities. The left ventricular internal cavity size was normal in size. There is  mild left ventricular hypertrophy. Left ventricular diastolic parameters are indeterminate. Right Ventricle: The right ventricular size is normal. No increase in right ventricular wall thickness. Right ventricular systolic function is normal. There is normal pulmonary artery systolic pressure. The tricuspid regurgitant velocity is 2.21 m/s, and  with an assumed right atrial pressure of 8 mmHg, the estimated right ventricular systolic pressure is 60.7 mmHg. Left Atrium: Left atrial size was normal in size. Right Atrium: Right atrial size was normal in size. Pericardium: There is no evidence of pericardial effusion. Presence of pericardial fat pad. Mitral Valve: The mitral valve is abnormal. Mild to moderate mitral annular calcification. Mild mitral valve regurgitation. Tricuspid Valve: The tricuspid valve is grossly normal. Tricuspid valve regurgitation is mild. Aortic Valve: The aortic valve is tricuspid. Aortic valve regurgitation is not visualized. Mild aortic valve annular calcification. Aortic valve mean gradient measures 5.0 mmHg. Aortic valve peak gradient measures 9.5 mmHg. Aortic valve area, by VTI measures 2.35 cm. Pulmonic Valve: The pulmonic valve was grossly normal. Pulmonic valve regurgitation is trivial. Aorta: The aortic root is normal in size and structure. Venous: The inferior vena cava is dilated in size with greater than  50% respiratory variability, suggesting right atrial pressure of 8 mmHg. IAS/Shunts: No atrial level shunt detected by color flow Doppler.  LEFT VENTRICLE PLAX 2D LVIDd:         4.50 cm LVIDs:         2.60 cm LV PW:         1.10 cm LV IVS:        0.90 cm LVOT diam:     1.90 cm LV SV:         67 LV SV Index:   35 LVOT Area:     2.84 cm  RIGHT VENTRICLE RV S prime:     9.03 cm/s TAPSE (M-mode): 1.8 cm LEFT ATRIUM             Index       RIGHT ATRIUM           Index LA diam:        4.80 cm 2.51 cm/m  RA Area:     22.50 cm LA Vol (A2C):   69.4 ml 36.26 ml/m RA Volume:   59.90 ml  31.29 ml/m LA Vol (A4C):   47.7 ml 24.92 ml/m LA Biplane Vol: 60.5 ml 31.61 ml/m  AORTIC VALVE AV Area (Vmax):    2.03 cm AV Area (Vmean):   2.02 cm AV Area (VTI):     2.35 cm AV Vmax:           154.00 cm/s AV Vmean:          101.000 cm/s AV VTI:            0.284 m AV Peak Grad:      9.5 mmHg AV Mean Grad:      5.0 mmHg LVOT Vmax:         110.00 cm/s LVOT Vmean:  72.000 cm/s LVOT VTI:          0.235 m LVOT/AV VTI ratio: 0.83  AORTA Ao Root diam: 3.10 cm MITRAL VALVE                TRICUSPID VALVE MV Area (PHT): 4.60 cm     TR Peak grad:   19.5 mmHg MV Decel Time: 165 msec     TR Vmax:        221.00 cm/s MV E velocity: 140.00 cm/s                             SHUNTS                             Systemic VTI:  0.24 m                             Systemic Diam: 1.90 cm Rozann Lesches MD Electronically signed by Rozann Lesches MD Signature Date/Time: 08/15/2019/5:18:17 PM    Final         Scheduled Meds:  allopurinol  300 mg Oral Daily   amLODipine  5 mg Oral Daily   brimonidine  1 drop Right Eye BID   cholecalciferol  1,000 Units Oral Daily   dorzolamide-timolol  1 drop Both Eyes BID   famotidine  10 mg Oral Daily   furosemide  40 mg Intravenous BID   mirtazapine  7.5 mg Oral QHS   nebivolol  10 mg Oral QHS   Netarsudil Dimesylate  1 drop Right Eye QHS   potassium chloride  40 mEq Oral Q4H   warfarin   2.5 mg Oral Once per day on Sun Tue Wed Thu Sat   warfarin  3.75 mg Oral Once per day on Mon Fri   Warfarin - Physician Dosing Inpatient   Does not apply q1600   Continuous Infusions:  magnesium sulfate bolus IVPB       LOS: 1 day    Time spent: over 30 min    Fayrene Helper, MD [facility]   To contact the attending provider between 7A-7P or the covering provider during after hours 7P-7A, please log into the web site [email or web address] and access using universal [place] password for that web site. If you do not have the password, please call the hospital operator.  08/16/2019, 10:36 AM

## 2019-08-17 LAB — BASIC METABOLIC PANEL
Anion gap: 8 (ref 5–15)
BUN: 25 mg/dL — ABNORMAL HIGH (ref 8–23)
CO2: 35 mmol/L — ABNORMAL HIGH (ref 22–32)
Calcium: 8.4 mg/dL — ABNORMAL LOW (ref 8.9–10.3)
Chloride: 100 mmol/L (ref 98–111)
Creatinine, Ser: 0.79 mg/dL (ref 0.44–1.00)
GFR calc Af Amer: >60 mL/min (ref >60)
GFR calc non Af Amer: >60 mL/min (ref >60)
Glucose, Bld: 116 mg/dL — ABNORMAL HIGH (ref 70–99)
Potassium: 4.7 mmol/L (ref 3.5–5.1)
Sodium: 143 mmol/L (ref 135–145)

## 2019-08-17 LAB — CBC WITH DIFFERENTIAL/PLATELET
Abs Immature Granulocytes: 0.06 10*3/uL (ref 0.00–0.07)
Basophils Absolute: 0 10*3/uL (ref 0.0–0.1)
Basophils Relative: 1 %
Eosinophils Absolute: 0.2 10*3/uL (ref 0.0–0.5)
Eosinophils Relative: 5 %
HCT: 29.5 % — ABNORMAL LOW (ref 36.0–46.0)
Hemoglobin: 8.3 g/dL — ABNORMAL LOW (ref 12.0–15.0)
Immature Granulocytes: 2 %
Lymphocytes Relative: 19 %
Lymphs Abs: 0.8 10*3/uL (ref 0.7–4.0)
MCH: 26.9 pg (ref 26.0–34.0)
MCHC: 28.1 g/dL — ABNORMAL LOW (ref 30.0–36.0)
MCV: 95.8 fL (ref 80.0–100.0)
Monocytes Absolute: 0.4 10*3/uL (ref 0.1–1.0)
Monocytes Relative: 9 %
Neutro Abs: 2.6 10*3/uL (ref 1.7–7.7)
Neutrophils Relative %: 64 %
Platelets: 179 10*3/uL (ref 150–400)
RBC: 3.08 MIL/uL — ABNORMAL LOW (ref 3.87–5.11)
RDW: 17.9 % — ABNORMAL HIGH (ref 11.5–15.5)
WBC: 4 10*3/uL (ref 4.0–10.5)
nRBC: 0.5 % — ABNORMAL HIGH (ref 0.0–0.2)

## 2019-08-17 LAB — PROTIME-INR
INR: 2.9 — ABNORMAL HIGH (ref 0.8–1.2)
Prothrombin Time: 29.5 seconds — ABNORMAL HIGH (ref 11.4–15.2)

## 2019-08-17 LAB — PHOSPHORUS: Phosphorus: 3.4 mg/dL (ref 2.5–4.6)

## 2019-08-17 LAB — MAGNESIUM: Magnesium: 1.9 mg/dL (ref 1.7–2.4)

## 2019-08-17 MED ORDER — FUROSEMIDE 40 MG PO TABS
40.0000 mg | ORAL_TABLET | Freq: Every day | ORAL | Status: DC
  Administered 2019-08-18: 40 mg via ORAL
  Filled 2019-08-17: qty 1

## 2019-08-17 MED ORDER — PANTOPRAZOLE SODIUM 40 MG PO TBEC
40.0000 mg | DELAYED_RELEASE_TABLET | Freq: Every day | ORAL | Status: DC
  Administered 2019-08-17 – 2019-08-18 (×2): 40 mg via ORAL
  Filled 2019-08-17 (×2): qty 1

## 2019-08-17 MED ORDER — WARFARIN SODIUM 2 MG PO TABS
2.0000 mg | ORAL_TABLET | Freq: Once | ORAL | Status: AC
  Administered 2019-08-17: 2 mg via ORAL
  Filled 2019-08-17: qty 1

## 2019-08-17 NOTE — [place] FL2 (Signed)
[place] MEDICAID FL2 LEVEL OF CARE SCREENING TOOL     IDENTIFICATION  Patient Name: [name] Birthdate: 09-Feb-1935 Sex: female Admission Date (Current Location): 08/15/2019  [facility] and [place] Number:  Herbalist and Address:  [facility], [place] [address], [place], [place] [address]      Provider Number: [id number]  Attending Physician Name and Address:  Elodia Florence., *  Relative Name and Phone Number:  Elenore Rota, spouse, [phone]    Current Level of Care: Hospital Recommended Level of Care: [place] Prior Approval Number:    Date Approved/Denied:   PASRR Number: [id number]  Discharge Plan: SNF    Current Diagnoses: Patient Active Problem List   Diagnosis Date Noted  . Acute exacerbation of CHF (congestive heart failure) ([place]) 08/15/2019  . Closed 2-part displaced fracture of surgical neck of right humerus 07/19/2019  . CHF (congestive heart failure) ([place]) 12/27/2018  . Acute on chronic diastolic (congestive) heart failure ([place]) 07/14/2018  . Acute on chronic diastolic heart failure ([place]) 07/13/2018  . Lumbosacral spondylosis without myelopathy 09/26/2017  . Chronic low back pain 07/21/2017  . Degeneration of lumbar intervertebral disc 04/21/2017  . Lumbar post-laminectomy syndrome 04/21/2017  . Esophageal dysphagia 10/15/2016  . Gastroesophageal reflux disease without esophagitis 10/15/2016  . Seasonal allergic rhinitis 10/15/2016  . Chronic atrial fibrillation ([place]) 05/15/2015  . Aftercare following surgery of the circulatory system, [place] 09/15/2013  . Essential hypertension 06/17/2013  . Atrial fibrillation with RVR ([place]) 04/01/2013  . Morbid obesity ([place]) 04/01/2013  . OSA on CPAP 04/01/2013  . Chronic diastolic CHF (congestive heart failure) ([place])   . H/o recurrent pulmonary embolism, on Coumadin 05/24/2012  . Acute on chronic diastolic congestive heart failure ([place]) 05/24/2012  . Normocytic  anemia 05/24/2012  . Thrombocytopenia ([place]) 05/24/2012  . Inadequate anticoagulation 05/24/2012  . Elevated brain natriuretic peptide (BNP) level 05/24/2012  . Shoulder arthritis 05/15/2012  . Stress incontinence 01/06/2012  . Chronic cystitis 01/06/2012  . Occlusion and stenosis of carotid artery without mention of cerebral infarction 09/10/2011  . OA (osteoarthritis) of hip 06/17/2011  . Osteoarthritis 01/08/2011  . Fibromyalgia 08/06/2010  . Dyslipidemia 08/06/2010  . Gout 08/06/2010  . Right carotid artery occlusion 08/06/2010    Orientation RESPIRATION BLADDER Height & Weight     Self, Time, Situation, Place  O2 (Nasal cannula 2L) Continent, External catheter Weight: 197 lb 1.5 oz (89.4 kg) Height:  5' 2"  (157.5 cm)  BEHAVIORAL SYMPTOMS/MOOD NEUROLOGICAL BOWEL NUTRITION STATUS      Continent Diet (Please see DC Summary)  AMBULATORY STATUS COMMUNICATION OF NEEDS Skin   Extensive Assist Verbally Normal                       Personal Care Assistance Level of Assistance  Bathing, Feeding, Dressing Bathing Assistance: Limited assistance Feeding assistance: Independent Dressing Assistance: Limited assistance     Functional Limitations Info  Sight, Hearing, Speech Sight Info: Impaired Hearing Info: Adequate Speech Info: Adequate    SPECIAL CARE FACTORS FREQUENCY  PT (By licensed PT), OT (By licensed OT)     PT Frequency: 5x/week OT Frequency: 5x/week            Contractures Contractures Info: Not present    Additional Factors Info  Code Status, Allergies Code Status Info: Full Allergies Info: Codeine, Cymbalta (Duloxetine Hcl), Diltiazem Cd (Diltiazem Hcl Er Beads), Duloxetine, Metoprolol, Mirtazapine, Nortriptyline, Penicillins, Statins, Tetanus Toxoids, Ciprofloxacin, Lyrica (Pregabalin)  Current Medications (08/17/2019):  This is the current hospital active medication list Current Facility-Administered Medications  Medication Dose Route  Frequency Provider Last Rate Last Admin  . 0.9 %  sodium chloride infusion   Intravenous PRN Elodia Florence., MD   Stopped at 08/16/19 1430  . allopurinol (ZYLOPRIM) tablet 300 mg  300 mg Oral Daily Fair, Marin Shutter, MD   300 mg at 08/17/19 1022  . amLODipine (NORVASC) tablet 5 mg  5 mg Oral Daily Fair, Marin Shutter, MD   5 mg at 08/17/19 1022  . benzonatate (TESSALON) capsule 100 mg  100 mg Oral TID PRN Fair, Marin Shutter, MD      . brimonidine (ALPHAGAN) 0.2 % ophthalmic solution 1 drop  1 drop Right Eye BID Chauncey Mann, MD   1 drop at 08/17/19 1022  . cholecalciferol (VITAMIN D3) tablet 1,000 Units  1,000 Units Oral Daily Chauncey Mann, MD   1,000 Units at 08/17/19 1021  . dorzolamide-timolol (COSOPT) 22.3-6.8 MG/ML ophthalmic solution 1 drop  1 drop Both Eyes BID Chauncey Mann, MD   1 drop at 08/17/19 1023  . famotidine (PEPCID) tablet 10 mg  10 mg Oral Daily Fair, Marin Shutter, MD   10 mg at 08/17/19 1022  . furosemide (LASIX) injection 40 mg  40 mg Intravenous BID Elodia Florence., MD   40 mg at 08/17/19 5027  . furosemide (LASIX) tablet 40 mg  40 mg Oral Daily Elodia Florence., MD      . HYDROcodone-acetaminophen (NORCO/VICODIN) 5-325 MG per tablet 0.5-1 tablet  0.5-1 tablet Oral Q6H PRN Chauncey Mann, MD   1 tablet at 08/17/19 1305  . ipratropium (ATROVENT) 0.06 % nasal spray 1 spray  1 spray Each Nare Daily PRN Fair, Chelsea N, MD      . latanoprost (XALATAN) 0.005 % ophthalmic solution 1 drop  1 drop Both Eyes QHS Elodia Florence., MD   1 drop at 08/16/19 2155  . loratadine (CLARITIN) tablet 10 mg  10 mg Oral Daily PRN Fair, Chelsea N, MD      . mirtazapine (REMERON) tablet 7.5 mg  7.5 mg Oral QHS Fair, Chelsea N, MD   7.5 mg at 08/15/19 2236  . nebivolol (BYSTOLIC) tablet 10 mg  10 mg Oral QHS Chauncey Mann, MD   10 mg at 08/16/19 2153  . Netarsudil dimeslyate (Rhopressa) 0.02% solution   1 drop Right Eye QHS Chauncey Mann, MD   1 drop at 08/16/19 2154  .  triamcinolone (NASACORT) nasal inhaler 2 spray  2 spray Nasal Daily PRN Fair, Chelsea N, MD      . warfarin (COUMADIN) tablet 2 mg  2 mg Oral ONCE-1600 [place] Jude, [facility]      . Warfarin - Pharmacist Dosing Inpatient   Does not apply q1600 Elodia Florence., MD         Discharge Medications: Please see discharge summary for a list of discharge medications.  Relevant Imaging Results:  Relevant Lab Results:   Additional Information SS # [id number]  Benard Halsted, LCSW

## 2019-08-17 NOTE — Progress Notes (Addendum)
Physical Therapy Treatment Patient Details Name: [name] MRN: [id number] DOB: July 23, 1934 Today's Date: 08/17/2019    History of Present Illness Pt is an 85 y.o. female recently d/c to SNF after R reverse TSA (07/20/19), was home one day before hospital readmission 08/15/19 with worsening SOB and weight gain. Pt hypertensive and required NRB. Workup for CHF exacerbation. PMH includes HTN, HLD, carotid stenosis, afib, OSA on CPAP, PE, CHF, GERD.   PT Comments    Pt progressing with mobility. Today's session focused on LE strengthening with sit<>stand transfers and dynamic standing tasks. Pt reliant on UE support and up to minA for standing mobility. Dependent for standing ADL task (posterior pericare). Remains limited by generalized weakness, decreased activity tolerance, poor balance strategies and pain. Motivated to participate. Feel pt would benefit from intensive CIR-level therapies to maximize functional mobility and independence prior to return home. D/c recommendations updated.  SpO2 89-95% on RA   Follow Up Recommendations  CIR;Supervision for mobility/OOB      Equipment Recommendations  None recommended by PT    Recommendations for Other Services       Precautions / Restrictions Precautions Precautions: Fall;Shoulder Type of Shoulder Precautions: per last admission, okay for AROM E/W/H, OK for pendulums, Forward flexion to 90, no abduction, ER to neutral okay, no AROM shoulder Precaution Comments: per last admission "Ok to [facility] for cane or walker use" Required Braces or Orthoses: Sling Restrictions Weight Bearing Restrictions: Yes RUE Weight Bearing: Non weight bearing Other Position/Activity Restrictions: assume NWB in RUE    Mobility  Bed Mobility Overal bed mobility: Needs Assistance Bed Mobility: Supine to Sit     Supine to sit: Min assist     General bed mobility comments: Received sitting in recliner  Transfers Overall transfer level: Needs  assistance Equipment used: None;Straight cane Transfers: Sit to/from Stand Sit to Stand: Min assist;Min guard Stand pivot transfers: Min assist       General transfer comment: Initial standing trials with SPC, pt required minA for trunk elevation; performed 5x sit<>stand without DME and use of LUE to push on recliner, able to stand with close min guard  Ambulation/Gait Ambulation/Gait assistance: Min assist Gait Distance (Feet): 4 Feet Assistive device: Straight cane Gait Pattern/deviations: Step-through pattern;Decreased stride length;Trunk flexed Gait velocity: Decreased   General Gait Details: Steps forwards/backwards and marching in place with SPC in LUE and intermittent minA for stability; pt reports feeling unstable; seated rest break between bouts of activity   Stairs             Wheelchair Mobility    Modified Rankin (Stroke Patients Only)       Balance Overall balance assessment: Needs assistance Sitting-balance support: Feet supported;No upper extremity supported Sitting balance-Leahy Scale: Fair     Standing balance support: Single extremity supported Standing balance-Leahy Scale: Poor Standing balance comment: Reliant on UE support for static standing; dependent for posterior pericare                            Cognition Arousal/Alertness: Awake/alert Behavior During Therapy: WFL for tasks assessed/performed Overall Cognitive Status: Within Functional Limits for tasks assessed                                 General Comments: WFL for simple tasks, not formally assessed      Exercises Shoulder Exercises Elbow Flexion: AROM;Right;PROM;5 reps;Seated (prefers PROM)  Wrist Flexion: AROM;Right;5 reps;Seated Wrist Extension: AROM;Right;5 reps;Seated Digit Composite Flexion: AROM;Right;Both;Seated Composite Extension: AROM;Right;Both;Seated Other Exercises Other Exercises: Repeated sit<>stands    General Comments General  comments (skin integrity, edema, etc.): SpO2 89-95% on 2L O2 [place], DOE up to 3/4 requiring seated rest breaks to recover. HR 106 with standing tasks      Pertinent Vitals/Pain Pain Assessment: Faces Faces Pain Scale: Hurts little more Pain Location: Lower back > R shoulder Pain Descriptors / Indicators: Grimacing;Aching;Sore Pain Intervention(s): Monitored during session;Repositioned;Patient requesting pain meds-RN notified    Home Living                      Prior Function            PT Goals (current goals can now be found in the care plan section) Acute Rehab PT Goals Patient Stated Goal: be able to go out to eat with husband Progress towards PT goals: Progressing toward goals    Frequency    Min 2X/week      PT Plan Discharge plan needs to be updated;Frequency needs to be updated    Co-evaluation              AM-PAC PT "6 Clicks" Mobility   Outcome Measure  Help needed turning from your back to your side while in a flat bed without using bedrails?: None Help needed moving from lying on your back to sitting on the side of a flat bed without using bedrails?: A Little Help needed moving to and from a bed to a chair (including a wheelchair)?: A Little Help needed standing up from a chair using your arms (e.g., wheelchair or bedside chair)?: A Little Help needed to walk in hospital room?: A Little Help needed climbing 3-5 steps with a railing? : A Lot 6 Click Score: 18    End of Session Equipment Utilized During Treatment: Oxygen Activity Tolerance: Patient tolerated treatment well;Patient limited by pain Patient left: in chair;with call bell/phone within reach;with chair alarm set Nurse Communication: Mobility status PT Visit Diagnosis: Unsteadiness on feet (R26.81);Other abnormalities of gait and mobility (R26.89);Muscle weakness (generalized) (M62.81);History of falling (Z91.81);Difficulty in walking, not elsewhere classified (R26.2);Pain     Time:  8372-9021 PT Time Calculation (min) (ACUTE ONLY): 25 min  Charges:  $Therapeutic Exercise: 23-37 mins                     Mabeline Caras, PT, DPT Acute Rehabilitation Services  Pager [phone] Office [place] 08/17/2019, 2:48 PM

## 2019-08-17 NOTE — Consult Note (Signed)
   [facility] Inpatient Consult   08/17/2019  [name] January 18, 1935 [id number]  [place] Patient:  Medicare Next Gen  Primary Care Provider: Deland Pretty, MD  Patient was assessed for [place] [facility] for community services. Patient was previously active with [facility].  Patient recently was at [facility] for rehab of her right shoulder surgery. Patient admitted with HF exacerbation.  Plan: Following for progress and disposition needs.  Was awaiting PT/OT evaluation and currently patient is being recommended for inpatient rehab.   Will follow progress.  Of note, [facility] Care Management services does not replace or interfere with any services that are arranged by inpatient [facility] care management team.   For additional questions or referrals please contact:   Natividad Brood, RN BSN [facility] [place] [facility] Liaison  [phone] business mobile phone Toll free office [phone]  Fax number: [phone] [name]

## 2019-08-17 NOTE — Progress Notes (Signed)
ANTICOAGULATION CONSULT NOTE - Initial Consult  Pharmacy Consult for Warfarin Indication: atrial fibrillation  Patient Measurements: Height: 5' 2"  (157.5 cm) Weight: 89.4 kg (197 lb 1.5 oz) IBW/kg (Calculated) : 50.1  Vital Signs: Temp: 98.1 F (36.7 C) (07/06 0346) Temp Source: Oral (07/06 0346) BP: 148/53 (07/06 0346) Pulse Rate: 60 (07/06 0346)  Labs: Recent Labs    08/15/19 0306 08/15/19 0306 08/15/19 0727 08/16/19 0433 08/16/19 0433 08/16/19 1532 08/17/19 0413  HGB 8.7*   < >  --  7.6*   < > 7.6* 8.3*  HCT 30.1*   < >  --  26.6*  --  26.8* 29.5*  PLT 179  --   --  149*  --   --  179  LABPROT 28.4*  --   --  31.8*  --   --  29.5*  INR 2.8*  --   --  3.2*  --   --  2.9*  CREATININE 0.82  --   --  0.82  --   --  0.79  TROPONINIHS  --   --  7  --   --   --   --    < > = values in this interval not displayed.    Estimated Creatinine Clearance: 55.3 mL/min (by C-G formula based on SCr of 0.79 mg/dL).   Assessment: 85 yr old on warfarin for Afib, admitted for HF exacerbation. INR 2.8 on admit, increased to 3.2 on home dose. Eating 60-100% of meals. H/H, plt stable.   Home warfarin: 2.5 mg po daily, except for 3.75 mg po on Monday, Friday (pt reported taking 3 mg on 08/14/19 but has 2.10m tablets at home)   Goal of Therapy:  INR 2-3 Monitor platelets by anticoagulation protocol: Yes   Plan:  Warfarin 267mx1 Monitor daily INR, CBC Monitor for signs/symptoms of bleeding  LyBenetta SparPharmD, BCPS, BCCP Clinical Pharmacist  Please check AMION for all [facility] numbers After 10:00 PM, call [facility]

## 2019-08-17 NOTE — Progress Notes (Signed)
Occupational Therapy Treatment Patient Details Name: [name] MRN: [id number] DOB: 07-12-1934 Today's Date: 08/17/2019    History of present illness 85 y.o. female with PMH of Gerd, PUD, hypertension, hyperlipidemia, Carotid stenosis, Pafib, CHF (diastolic), OSA on Cpap, h/o PE presents with SOB and admitted for CHF exacerbation. Patient reports she had been at a rehab facility after shoulder surgery last month and was home for 1 day before coming to the hospital with weight gain and worsening SoB. In ED pt hypertensive requiring non-rebreather with even small movements. Pt admitted 08/15/19 for treatment of acute CHF   OT comments  Pt making steady progress towards OT goals this session. Pt reports needing to void bowels at start of session however pt noted to be incontinent of stool once sitting EOB. Pt required hand held MIN A to stand pivot to [facility], total A for posterior pericare via sit<>stand. Pt required a seated rest break post toileting, but pt able to pivot over to recliner with MIN A. Pt continues to present with decreased activity tolerance, generalized weakness, and impaired balance impacting pts ability to complete BADLs. Pt completed light hand, wrist and elbow therex as indicated below with pt reporting some increased pain during elbow flexion therefore terminated therex and repositioned pts sling with total A. DC plan remains appropriate, will follow acutely per POC.    Follow Up Recommendations  SNF;Supervision/Assistance - 24 hour    Equipment Recommendations  Other (comment) (TBD)    Recommendations for Other Services      Precautions / Restrictions Precautions Precautions: Fall;Shoulder Type of Shoulder Precautions: per last admission, okay for AROM E/W/H, OK for pendulums, Forward flexion to 90, no abduction, ER to neutral okay, no AROM shoulder Precaution Comments: per last admission "Ok to [facility] for cane or walker use" Required Braces or Orthoses:  Sling Restrictions Other Position/Activity Restrictions: assume NWB in RUE       Mobility Bed Mobility Overal bed mobility: Needs Assistance Bed Mobility: Supine to Sit     Supine to sit: Min assist     General bed mobility comments: ot reaching out for HHA with LUE when elevating trunk. pt able to scoot hips to EOB with increased time  Transfers Overall transfer level: Needs assistance Equipment used: 1 person hand held assist Transfers: Sit to/from [facility] Sit to Stand: Min assist Stand pivot transfers: Min assist       General transfer comment: MIN A to power into standing with HHA, MIN A for balance to pivot EOB>BSC>recliner    Balance Overall balance assessment: Needs assistance Sitting-balance support: Feet supported;No upper extremity supported Sitting balance-Leahy Scale: Fair     Standing balance support: Single extremity supported Standing balance-Leahy Scale: Poor Standing balance comment: reliant on at least one UE supported during transfers                           ADL either performed or assessed with clinical judgement   ADL Overall ADL's : Needs assistance/impaired                 Upper Body Dressing : Total assistance;Sitting Upper Body Dressing Details (indicate cue type and reason): assist for sling management      Toilet Transfer: Minimal assistance;Stand-pivot;BSC Toilet Transfer Details (indicate cue type and reason): MIN A for balance to pivot to [facility] with HHA Toileting- Clothing Manipulation and Hygiene: Total assistance;Sit to/from stand Toileting - Clothing Manipulation Details (indicate cue type and  reason): total A for posterior pericare post BM     Functional mobility during ADLs: Minimal assistance (stand pivot only) General ADL Comments: pt limited by fatigue and decreased activity tolerance     Vision       Perception     Praxis      Cognition Arousal/Alertness:  Awake/alert Behavior During Therapy: WFL for tasks assessed/performed Overall Cognitive Status: Within Functional Limits for tasks assessed                                 General Comments: overall WFL for simple ADL and mobility tasks        Exercises Shoulder Exercises Elbow Flexion: AROM;Right;PROM;5 reps;Seated (prefers PROM) Wrist Flexion: AROM;Right;5 reps;Seated Wrist Extension: AROM;Right;5 reps;Seated Digit Composite Flexion: AROM;Right;Both;Seated Composite Extension: AROM;Right;Both;Seated   Shoulder Instructions       General Comments pt husband at start of session. pt on 2L [place] upon arrrival with O2 WFL. doffed O2 during mobility with pt reporting feeling SOB post activity, unable to obtain accurate reading but do feel pt SOB. Donned O2 back on 2L at end of session    Pertinent Vitals/ Pain       Pain Assessment: Faces Faces Pain Scale: Hurts a little bit Pain Location: R shoulder Pain Descriptors / Indicators: Grimacing;Aching;Sore Pain Intervention(s): Limited activity within patient's tolerance;Monitored during session;Repositioned  Home Living                                          Prior Functioning/Environment              Frequency  Min 2X/week        Progress Toward Goals  OT Goals(current goals can now be found in the care plan section)  Progress towards OT goals: Progressing toward goals  Acute Rehab OT Goals Patient Stated Goal: be able to go out to eat with husband OT Goal Formulation: With patient Time For Goal Achievement: 08/29/19 Potential to Achieve Goals: Good  Plan Discharge plan remains appropriate;Frequency remains appropriate    Co-evaluation                 AM-PAC OT "6 Clicks" Daily Activity     Outcome Measure   Help from another person eating meals?: None Help from another person taking care of personal grooming?: A Little Help from another person toileting, which includes  using toliet, bedpan, or urinal?: A Lot Help from another person bathing (including washing, rinsing, drying)?: A Lot Help from another person to put on and taking off regular upper body clothing?: A Lot Help from another person to put on and taking off regular lower body clothing?: A Lot 6 Click Score: 15    End of Session Equipment Utilized During Treatment: Gait belt;Oxygen;Other (comment) (2L; sling)  OT Visit Diagnosis: Other abnormalities of gait and mobility (R26.89);Muscle weakness (generalized) (M62.81)   Activity Tolerance Patient tolerated treatment well   Patient Left in chair;with call bell/phone within reach;with chair alarm set   Nurse Communication Mobility status;Other (comment) (BM in BR to look at)        Time: 1129-1201 OT Time Calculation (min): 32 min  Charges: OT General Charges $OT Visit: 1 Visit OT Treatments $Self Care/Home Management : 8-22 mins $Therapeutic Exercise: 8-22 mins  Corinne Ports C., COTA/L Acute Rehabilitation Services [phone]  [place] 08/17/2019, 1:05 PM

## 2019-08-17 NOTE — TOC Initial Note (Signed)
Transition of Care [facility]) - Initial/Assessment Note    Patient Details  Name: [name] MRN: [id number] Date of Birth: [birth year]  Transition [facility] CM/SW Contact:    Benard Halsted, LCSW Phone Number: 08/17/2019, 4:32 PM  Clinical Narrative:                 CSW received consult for possible SNF placement at time of discharge. CSW spoke with patient regarding PT recommendation of SNF placement at time of discharge. Patient reported that patient's spouse is currently unable to care for patient at their home given patient's current physical needs and fall risk. Patient expressed understanding of PT recommendation and is agreeable to SNF placement at time of discharge. She was at [facility] from 6/11-7/3. CSW discussed insurance and how her secondary insurance would pay her co-pays at SNF. Patient expressed being hopeful for rehab and to feel better soon. She requested referral be sent to all facilities to see what is available, though she did like the therapy at [facility] and wished it had been for more time instead of thirty minute sessions. CSW spoke with patient's spouse and he reported that he thought patient would want to go back to [facility] but is open to her going wherever she wants. No further questions reported at this time. CSW to continue to follow and assist with discharge planning needs.   Expected Discharge Plan: Skilled Nursing Facility Barriers to Discharge: Continued Medical Work up   Patient Goals and CMS Choice Patient states their goals for this hospitalization and ongoing recovery are:: Rehab CMS Medicare.gov Compare Post Acute Care list provided to:: Patient Choice offered to / list presented to : Patient, Spouse  Expected Discharge Plan and Services Expected Discharge Plan: [place] In-house Referral: Clinical Social Work   Post Acute Care Choice: [place] Living arrangements for the past 2 months: [place]                                      Prior Living Arrangements/Services Living arrangements for the past 2 months: [place] Lives with:: Spouse Patient language and need for interpreter reviewed:: Yes Do you feel safe going back to the place where you live?: Yes      Need for Family Participation in Patient Care: Yes (Comment) Care giver support system in place?: Yes (comment) Current home services: DME (oxygen) Criminal Activity/Legal Involvement Pertinent to Current Situation/Hospitalization: No - Comment as needed  Activities of Daily Living Home Assistive Devices/Equipment: Environmental consultant (specify type) ADL Screening (condition at time of admission) Patient's cognitive ability adequate to safely complete daily activities?: Yes Is the patient deaf or have difficulty hearing?: No Does the patient have difficulty seeing, even when wearing glasses/contacts?: No Does the patient have difficulty concentrating, remembering, or making decisions?: No Patient able to express need for assistance with ADLs?: Yes Does the patient have difficulty dressing or bathing?: Yes Independently performs ADLs?: No Communication: Independent Dressing (OT): Needs assistance Is this a change from baseline?: Pre-admission baseline Grooming: Needs assistance Is this a change from baseline?: Pre-admission baseline Feeding: Independent Bathing: Needs assistance Is this a change from baseline?: Pre-admission baseline Toileting: Needs assistance Is this a change from baseline?: Pre-admission baseline Walks in Home: Independent with device (comment) Does the patient have difficulty walking or climbing stairs?: No Weakness of Legs: Both Weakness of Arms/Hands: Both  Permission Sought/Granted Permission  sought to share information with : Facility Sport and exercise psychologist, Family Supports Permission granted to share information with : Yes, Verbal Permission Granted  Share Information  with NAME: Elenore Rota  Permission granted to share info w AGENCY: SNFs  Permission granted to share info w Relationship: Spouse  Permission granted to share info w Contact Information: [phone]  Emotional Assessment Appearance:: Appears stated age Attitude/Demeanor/Rapport: Engaged, Gracious Affect (typically observed): Accepting, Appropriate, Pleasant Orientation: : Oriented to Self, Oriented to Place, Oriented to  Time, Oriented to Situation Alcohol / Substance Use: Not Applicable Psych Involvement: No (comment)  Admission diagnosis:  Hypoxia [R09.02] Acute exacerbation of CHF (congestive heart failure) (HCC) [I50.9] Acute on chronic congestive heart failure, unspecified heart failure type [facility]) [I50.9] Patient Active Problem List   Diagnosis Date Noted  . Acute exacerbation of CHF (congestive heart failure) ([place]) 08/15/2019  . Closed 2-part displaced fracture of surgical neck of right humerus 07/19/2019  . CHF (congestive heart failure) ([place]) 12/27/2018  . Acute on chronic diastolic (congestive) heart failure ([place]) 07/14/2018  . Acute on chronic diastolic heart failure ([place]) 07/13/2018  . Lumbosacral spondylosis without myelopathy 09/26/2017  . Chronic low back pain 07/21/2017  . Degeneration of lumbar intervertebral disc 04/21/2017  . Lumbar post-laminectomy syndrome 04/21/2017  . Esophageal dysphagia 10/15/2016  . Gastroesophageal reflux disease without esophagitis 10/15/2016  . Seasonal allergic rhinitis 10/15/2016  . Chronic atrial fibrillation ([place]) 05/15/2015  . Aftercare following surgery of the circulatory system, [place] 09/15/2013  . Essential hypertension 06/17/2013  . Atrial fibrillation with RVR ([place]) 04/01/2013  . Morbid obesity ([place]) 04/01/2013  . OSA on CPAP 04/01/2013  . Chronic diastolic CHF (congestive heart failure) ([place])   . H/o recurrent pulmonary embolism, on Coumadin 05/24/2012  . Acute on chronic diastolic congestive heart failure ([place]) 05/24/2012  .  Normocytic anemia 05/24/2012  . Thrombocytopenia ([place]) 05/24/2012  . Inadequate anticoagulation 05/24/2012  . Elevated brain natriuretic peptide (BNP) level 05/24/2012  . Shoulder arthritis 05/15/2012  . Stress incontinence 01/06/2012  . Chronic cystitis 01/06/2012  . Occlusion and stenosis of carotid artery without mention of cerebral infarction 09/10/2011  . OA (osteoarthritis) of hip 06/17/2011  . Osteoarthritis 01/08/2011  . Fibromyalgia 08/06/2010  . Dyslipidemia 08/06/2010  . Gout 08/06/2010  . Right carotid artery occlusion 08/06/2010   PCP:  Deland Pretty, MD Pharmacy:   [facility], [place] [address] Phone: [phone] Fax: [phone]     Social Determinants of Health (SDOH) Interventions    Readmission Risk Interventions Readmission Risk Prevention Plan 08/17/2019 07/21/2019  Transportation Screening Complete Complete  PCP or Specialist Appt within 3-5 Days Complete Complete  HRI or [place] Complete Complete  Social Work Consult for [place] Planning/Counseling Complete Complete  Palliative Care Screening Not Applicable Complete  Medication Review Press photographer) Referral to Pharmacy Complete  Some recent data might be hidden

## 2019-08-17 NOTE — Progress Notes (Addendum)
PROGRESS NOTE    [name]  KPV:[id number] DOB: 10-May-1934 DOA: 08/15/2019 PCP: Deland Pretty, MD   Chief Complaint  Patient presents with  . Shortness of Breath    Brief Narrative: [name] is [name] 85 y.o. female with PMH of Gerd, PUD, hypertension, hyperlipidemia, Carotid stenosis, Pafib, CHF (diastolic), OSA on Cpap, h/o PE presents with SOB and admitted for CHF exacerbation.  Notably, she has oxygen at home that she reportedly uses sparingly.    She's been improving with IV lasix.  Hopefully will be medically ready in the next 24-48 hrs, but will need placement.  Will ask CIR to evaluate her.  Assessment & Plan:   Principal Problem:   Acute exacerbation of CHF (congestive heart failure) (HCC) Active Problems:   Gout   OA (osteoarthritis) of hip   Normocytic anemia   Chronic diastolic CHF (congestive heart failure) (HCC)   Morbid obesity (HCC)   OSA on CPAP   Essential hypertension   Chronic atrial fibrillation (HCC)   Acute on chronic diastolic (congestive) heart failure (HCC)   Gastroesophageal reflux disease without esophagitis  85 y.o. female with PMH of Gerd, PUD, hypertension, hyperlipidemia, Carotid stenosis, Pafib, CHF (diastolic), OSA on Cpap, h/o PE presents with SOB and admitted for CHF exacerbation.  Acute respiratory failure with hypoxia in the setting of acute on chronic diastolic CHF, POA -SOB M7-5 days with weight gain, increased LE edema and progressive dyspnea -requiring 5L O2 on admission and non-rebreather for moving her in bed -Last Echo: June 2020: Normal EF, diastolic HF - repeat echo with EF 65-70%, indeterminate diastolic parameters (see report) - continue lasix 40 IV BID -> transition to PO tomorrow  - she notes her dry weight at home is ~200 lbs (she's was 204.8 on admission, 197 today) - starting to develop metabolic alkalosis - transition to PO lasix tomorrow - CXR 7/6 improved atelectasis and effusions, persistent pulmonary edema -  repeat CXR 7/7 - I/O, daily weights - continue telemetry  - Continue bystolic  - elevated BNP, troponin wnl - therapy  Metabolic Alkalosis: 2/2 diuresis, transition to PO tomorrow  Normocytic Anemia -Hgb 8.7 on admission; baseline -normal iron, ferritin.  Follow B12, folate (both wnl) - Hb 8.3 today, (she's been consented for blood if indicated)  Permanent atrial fibrillation  -Cont Warfarin; INR at goal on admission  -Daily INR (2.9) - dosing per pharmacy -Tele  Dysphagia: follow SLP recs, s/p MBS - SLP recommended follow up with gastroenterologist, oropharyngeal swallow within functional limits, esophageal screening with retention of boluses in upper thoracic esophagus with delayed transport to stomach.  Recommended GERD and swallowing precautions, GI follow up.  Hypertension -continue amlodipine, Bystolic  Gout -Continue allopurinol  GERD -Continue home equivalent  OSA -Continue CPAP  Hypomagnesemia  Hypokalemia: replace and follow   Displaced and comminuted proximal humerus fracture s/p reverse shoulder arthroplasty - she was NWB to RUE at time of discharge (6/11) - she's followed up with Dr. Stann Mainland since then - discussed with one of Dr. Stann Mainland partners to pass message along that she was here - he recommended continued NWB - therapy   DVT prophylaxis: warfarin Code Status: full Family Communication: son over phone Disposition:   Status is: Inpatient  Remains inpatient appropriate because:Inpatient level of care appropriate due to severity of illness   Dispo: The patient is from: Home              Anticipated d/c is to: Home  Anticipated d/c date is: > 3 days              Patient currently is not medically stable to d/c.  Consultants:   none  Procedures:  none  Antimicrobials:  Anti-infectives (From admission, onward)   None     Subjective: Feeling some better, no new complaints Breathing feels about the  same  Objective: Vitals:   08/16/19 1101 08/16/19 1400 08/16/19 1950 08/17/19 0346  BP: 109/72 97/72 110/63 (!) 148/53  Pulse: 76 68 73 60  Resp: 20 20 20 20   Temp: 98.1 F (36.7 C)  (!) 97.5 F (36.4 C) 98.1 F (36.7 C)  TempSrc: Oral  Oral Oral  SpO2: 92% 92% 93% 97%  Weight:    89.4 kg  Height:        Intake/Output Summary (Last 24 hours) at 08/17/2019 0951 Last data filed at 08/17/2019 0645 Gross per 24 hour  Intake 713.99 ml  Output 1500 ml  Net -786.01 ml   Filed Weights   08/15/19 0604 08/16/19 0032 08/17/19 0346  Weight: 91 kg 89 kg 89.4 kg    Examination:  General: No acute distress. Cardiovascular: Heart sounds show [name] regular rate, and rhythm.  Lungs: Clear to auscultation bilaterally  Abdomen: Soft, nontender, nondistended  Neurological: Alert and oriented 3. Moves all extremities 4. Cranial nerves II through XII grossly intact. Skin: Warm and dry. No rashes or lesions. Extremities: No clubbing or cyanosis. No edema.   Data Reviewed: I have personally reviewed following labs and imaging studies  CBC: Recent Labs  Lab 08/15/19 0306 08/16/19 0433 08/16/19 1532 08/17/19 0413  WBC 5.6 4.1  --  4.0  NEUTROABS  --  2.8  --  2.6  HGB 8.7* 7.6* 7.6* 8.3*  HCT 30.1* 26.6* 26.8* 29.5*  MCV 97.1 96.0  --  95.8  PLT 179 149*  --  782    Basic Metabolic Panel: Recent Labs  Lab 08/15/19 0306 08/15/19 0727 08/16/19 0433 08/17/19 0413  NA 143  --  143 143  K 4.1  --  3.3* 4.7  CL 107  --  101 100  CO2 31  --  33* 35*  GLUCOSE 128*  --  113* 116*  BUN 15  --  17 25*  CREATININE 0.82  --  0.82 0.79  CALCIUM 8.9  --  8.3* 8.4*  MG  --  1.5* 1.6* 1.9  PHOS  --   --   --  3.4    GFR: Estimated Creatinine Clearance: 55.3 mL/min (by C-G formula based on SCr of 0.79 mg/dL).  Liver Function Tests: No results for input(s): AST, ALT, ALKPHOS, BILITOT, PROT, ALBUMIN in the last 168 hours.  CBG: No results for input(s): GLUCAP in the last 168  hours.   Recent Results (from the past 240 hour(s))  SARS Coronavirus 2 by RT PCR (hospital order, performed in [facility] hospital lab) Nasopharyngeal Nasopharyngeal Swab     Status: None   Collection Time: 08/15/19  3:56 AM   Specimen: Nasopharyngeal Swab  Result Value Ref Range Status   SARS Coronavirus 2 NEGATIVE NEGATIVE Final    Comment: (NOTE) SARS-CoV-2 target nucleic acids are NOT DETECTED.  The SARS-CoV-2 RNA is generally detectable in upper and lower respiratory specimens during the acute phase of infection. The lowest concentration of SARS-CoV-2 viral copies this assay can detect is 250 copies / mL. Welborn Keena negative result does not preclude SARS-CoV-2 infection and should not be used as the sole basis  for treatment or other patient management decisions.  Rashaad Hallstrom negative result may occur with improper specimen collection / handling, submission of specimen other than nasopharyngeal swab, presence of viral mutation(s) within the areas targeted by this assay, and inadequate number of viral copies (<250 copies / mL). Kaitlyn Skowron negative result must be combined with clinical observations, patient history, and epidemiological information.  Fact Sheet for Patients:   StrictlyIdeas.no  Fact Sheet for Healthcare Providers: BankingDealers.co.za  This test is not yet approved or  cleared by the [place] FDA and has been authorized for detection and/or diagnosis of SARS-CoV-2 by FDA under an Emergency Use Authorization (EUA).  This EUA will remain in effect (meaning this test can be used) for the duration of the COVID-19 declaration under Section 564(b)(1) of the Act, 21 U.S.C. section 360bbb-3(b)(1), unless the authorization is terminated or revoked sooner.  Performed at [facility], [place] [address], [place], [place] [address]          Radiology Studies: DG CHEST PORT 1 VIEW  Result Date: 08/16/2019 CLINICAL DATA:  Hypoxia.  Patient status post right shoulder replacement 07/20/2019. EXAM: PORTABLE CHEST 1 VIEW COMPARISON:  Single-view of the chest 08/15/2019. PA and lateral chest 09/08/2018 FINDINGS: Marked cardiomegaly and interstitial edema persist. Bibasilar atelectasis and small effusions appear improved. Atherosclerosis noted. No pneumothorax. IMPRESSION: Improved bibasilar atelectasis and small effusions. No notable change in cardiomegaly and pulmonary edema. Electronically Signed   By: Inge Rise M.D.   On: 08/16/2019 12:16   DG Swallowing Func-Speech Pathology  Result Date: 08/16/2019 Objective Swallowing Evaluation: Type of Study: Bedside Swallow Evaluation  Patient Details Name: [name] MRN: [id number] Date of Birth: 1934/05/11 Today's Date: 08/16/2019 Time: SLP Start Time (ACUTE ONLY): 1030 -SLP Stop Time (ACUTE ONLY): 1047 SLP Time Calculation (min) (ACUTE ONLY): 17 min Past Medical History: Past Medical History: Diagnosis Date . Atrial fibrillation ([place])  . CAP (community acquired pneumonia) 12/27/2018 . Carotid artery occlusion  . Chronic diastolic CHF (congestive heart failure) (HCC)   Hypertensive heart disease 02-12-14 . Crohn's disease ([place])  . Detached retina  . Fibromyalgia  . GERD (gastroesophageal reflux disease)  . Gout  . H/O hiatal hernia  . High triglycerides  . History of stomach ulcers 1950's . Hypertension  . Long term current use of anticoagulant  . Obstructive sleep apnea on CPAP  . Osteoarthritis   PAIN AND OA LEF T HIP AND BOTH SHOULDERS ARE BONE ON BONE AND PAINFUL . Peripheral vascular disease (HCC)   KNOWN RIGHT INTERNAL CAROTID ARTERY OCCLUSION (NO STROKE)  --40 TO 59% STENOSIS LEFT ICA-FOLLOWED BY DR. EARLY WITH DOPPLER STUDY EVERY 6 MONTHS . Pulmonary embolism ([place]) 2011  Alizaya Oshea. Hx of PE in 02/2009 after R hip surgery, venous dopplers negative, long-term Coumadin. [name] Kitchen PVC (premature ventricular contraction)   PT STATES HX OF PVC'S ON EKG . Recurrent upper respiratory infection (URI)   BRONCHITIS  FEB 2013--SLIGHT COUGH NON-PRODUCTIVE NOW . Recurrent UTI (urinary tract infection)   "on daily medicine" (05/14/2012) . Tinnitus  . Vertigo  Past Surgical History: Past Surgical History: Procedure Laterality Date . APPENDECTOMY  1950's . BACK SURGERY  10/29/06  Central and foraminal decompression L3-L4, L4-L5, and L5-S1 with inspection of L4-L5 and L5-S1 disc on the right . CARDIAC CATHETERIZATION  1970's  "maybe 2" (05/14/2012) . CARDIAC CATHETERIZATION   . CATARACT EXTRACTION W/ INTRAOCULAR LENS IMPLANT Right 2009 . CHOLECYSTECTOMY  ~ 1988 . DECOMPRESSIVE LUMBAR LAMINECTOMY LEVEL 3  ~ 2002 . DILATION AND  CURETTAGE OF UTERUS    "several; from miscarriages" (05/14/2012) . EXCISION MORTON'S NEUROMA Right 05/20/00  "foot" (05/14/2012) . EYE SURGERY Left 2014  Detached retina . EYE SURGERY Left August 23, 2013  Cataract . FEMUR FRACTURE SURGERY Right 02/21/09  Open reduction internal fixation of right periprosthetic  femur fracture utilizing Zimmer cables times fiv . FRACTURE SURGERY  2011  Right Femur Fx . HAMMER TOE SURGERY Left 10/25/08  "toe next to big to" (05/14/2012) . JOINT REPLACEMENT  2009  Right Hip replacement . JOINT REPLACEMENT  2012  Right knee replacement . JOINT REPLACEMENT  06/17/11  Left Hip replacement . KNEE ARTHROSCOPY Right 07/26/05 . REPLACEMENT TOTAL KNEE Right 02/19/2010 . REVISION TOTAL SHOULDER TO REVERSE TOTAL SHOULDER Right 07/20/2019  Procedure: REVERSE TOTAL SHOULDER REPLACEMENT;  Surgeon: Nicholes Stairs, MD;  Location: [place] [facility];  Service: Orthopedics;  Laterality: Right; . SPINE SURGERY   . [place]? . TOTAL HIP ARTHROPLASTY Right 12/08/02  Osteonics total hip replacement . TOTAL HIP ARTHROPLASTY  06/17/2011  Procedure: TOTAL HIP ARTHROPLASTY;  Surgeon: Gearlean Alf, MD;  Location: [facility];  Service: Orthopedics;  Laterality: Left; . TOTAL SHOULDER ARTHROPLASTY Left 05/14/2012 . TOTAL SHOULDER ARTHROPLASTY Left 05/14/2012  Procedure: TOTAL SHOULDER ARTHROPLASTY;  Surgeon: Marin Shutter, MD;  Location: [place];  Service: Orthopedics;  Laterality: Left; [name] Kitchen VAGINAL HYSTERECTOMY  1971 HPI: Pt is an 85 y.o. female with PMH of GERD, PUD, hypertension, hyperlipidemia, Carotid stenosis, Pafib, CHF (diastolic), OSA, h/o PE who presented with SOB and was admitted for CHF exacerbation. CXR 7/4: Cardiomegaly with pulmonary edema and small bilateral pleural effusions. Findings most consistent with CHF. WBC WNL. Pt is an 85 y.o. female with PMH of GERD, PUD, hypertension, hyperlipidemia, Carotid stenosis, Pafib, CHF (diastolic), OSA, h/o PE who presented with SOB and was admitted for CHF exacerbation. CXR 7/4: Cardiomegaly with pulmonary edema and small bilateral pleural effusions. Findings most consistent with CHF. WBC WNL.  No data recorded Assessment / Plan / Recommendation CHL IP CLINICAL IMPRESSIONS 08/16/2019 Clinical Impression Pt was seen in radiology suite for modified barium swallow study. Trials of puree solids, regular texture solids, Dorice Stiggers 31m barium tablet, and thin liquids via cup and straw were administered. Pt's oropharyngeal swallow mechanism was within functional limits. Esophageal screening revealed retention of boluses in the upper thoracic esophagus with delayed transport to the stomach. Backflow of this material to the pharynx was not observed during the study. However, considering the pt's report of increased reflux and increased signs of aspiration, the possibility of backflow of boluses retained in the thoracic esophagus being aspirated is strongly considered. It is recommended that the current diet be continued with GERD and swallowing precautions. Pt was advised to follow up with her gastroenterologist regarding her symptoms and she verbalized agreement with this recommendation.  SLP Visit Diagnosis Dysphagia, unspecified (R13.10) Attention and concentration deficit following -- Frontal lobe and executive function deficit following -- Impact on safety and function Mild aspiration risk    CHL IP TREATMENT RECOMMENDATION 08/16/2019 Treatment Recommendations No treatment recommended at this time   No flowsheet data found. CHL IP DIET RECOMMENDATION 08/16/2019 SLP Diet Recommendations Regular solids;Thin liquid Liquid Administration via Cup;Straw Medication Administration Whole meds with liquid Compensations Slow rate;Small sips/bites;Follow solids with liquid Postural Changes Remain semi-upright after after feeds/meals (Comment)   CHL IP OTHER RECOMMENDATIONS 08/16/2019 Recommended Consults -- Oral Care Recommendations Oral care BID Other Recommendations --   CHL IP FOLLOW UP RECOMMENDATIONS 08/16/2019 Follow up  Recommendations None   CHL IP FREQUENCY AND DURATION 08/16/2019 Speech Therapy Frequency (ACUTE ONLY) min 2x/week Treatment Duration 1 week      CHL IP ORAL PHASE 08/16/2019 Oral Phase WFL Oral - Pudding Teaspoon -- Oral - Pudding Cup -- Oral - Honey Teaspoon -- Oral - Honey Cup -- Oral - Nectar Teaspoon -- Oral - Nectar Cup -- Oral - Nectar Straw -- Oral - Thin Teaspoon -- Oral - Thin Cup -- Oral - Thin Straw -- Oral - Puree -- Oral - Mech Soft -- Oral - Regular -- Oral - Multi-Consistency -- Oral - Pill -- Oral Phase - Comment --  CHL IP PHARYNGEAL PHASE 08/16/2019 Pharyngeal Phase WFL Pharyngeal- Pudding Teaspoon -- Pharyngeal -- Pharyngeal- Pudding Cup -- Pharyngeal -- Pharyngeal- Honey Teaspoon -- Pharyngeal -- Pharyngeal- Honey Cup -- Pharyngeal -- Pharyngeal- Nectar Teaspoon -- Pharyngeal -- Pharyngeal- Nectar Cup -- Pharyngeal -- Pharyngeal- Nectar Straw -- Pharyngeal -- Pharyngeal- Thin Teaspoon -- Pharyngeal -- Pharyngeal- Thin Cup -- Pharyngeal -- Pharyngeal- Thin Straw -- Pharyngeal -- Pharyngeal- Puree -- Pharyngeal -- Pharyngeal- Mechanical Soft -- Pharyngeal -- Pharyngeal- Regular -- Pharyngeal -- Pharyngeal- Multi-consistency -- Pharyngeal -- Pharyngeal- Pill -- Pharyngeal -- Pharyngeal Comment --  CHL IP CERVICAL ESOPHAGEAL PHASE 08/16/2019 Cervical Esophageal Phase Impaired Pudding Teaspoon  -- Pudding Cup -- Honey Teaspoon -- Honey Cup -- Nectar Teaspoon -- Nectar Cup -- Nectar Straw -- Thin Teaspoon -- Thin Cup -- Thin Straw -- Puree -- Mechanical Soft -- Regular -- Multi-consistency -- Pill -- Cervical Esophageal Comment See Clinical Impressions  [name], [place], [place] Office number [phone] Pager [phone] [name] 08/16/2019, 12:40 PM              ECHOCARDIOGRAM COMPLETE  Result Date: 08/15/2019    ECHOCARDIOGRAM REPORT   Patient Name:   [name] Date of Exam: 08/15/2019 Medical Rec #:  [id number]     Height:       62.0 in Accession #:    [id number]    Weight:       200.6 lb Date of Birth:  1935-02-06     BSA:          1.914 m Patient Age:    12 years      BP:           118/43 mmHg Patient Gender: F             HR:           69 bpm. Exam Location:  Inpatient Procedure: 2D Echo, Cardiac Doppler and Color Doppler Indications:    Heart Failure  History:        Patient has prior history of Echocardiogram examinations, most                 recent 07/14/2018. CHF, Arrythmias:Atrial Fibrillation and PVC;                 Risk Factors:Dyslipidemia and Hypertension. Morbid obesity, OSA                 on CPAP, H/o recurrent pulmonary embolism, on Coumadin.  Sonographer:    Alvino Chapel RCS Referring Phys: [phone] Bodhi Stenglein CALDWELL [place]  1. Left ventricular ejection fraction, by estimation, is 65 to 70%. The left ventricle has normal function. The left ventricle has no regional wall motion abnormalities. There is mild left ventricular hypertrophy. Left ventricular diastolic parameters are indeterminate.  2. Right ventricular systolic function  is normal. The right ventricular size is normal. There is normal pulmonary artery systolic pressure. The estimated right ventricular systolic pressure is 96.7 mmHg.  3. The mitral valve is abnormal, mild to moderate annular calcification. Mild mitral valve regurgitation.  4. The aortic valve is  tricuspid. Aortic valve regurgitation is not visualized.  5. The inferior vena cava is dilated in size with >50% respiratory variability, suggesting right atrial pressure of 8 mmHg.  6. Promiment circumferential pericardial fat pad. FINDINGS  Left Ventricle: Left ventricular ejection fraction, by estimation, is 65 to 70%. The left ventricle has normal function. The left ventricle has no regional wall motion abnormalities. The left ventricular internal cavity size was normal in size. There is  mild left ventricular hypertrophy. Left ventricular diastolic parameters are indeterminate. Right Ventricle: The right ventricular size is normal. No increase in right ventricular wall thickness. Right ventricular systolic function is normal. There is normal pulmonary artery systolic pressure. The tricuspid regurgitant velocity is 2.21 m/s, and  with an assumed right atrial pressure of 8 mmHg, the estimated right ventricular systolic pressure is 59.1 mmHg. Left Atrium: Left atrial size was normal in size. Right Atrium: Right atrial size was normal in size. Pericardium: There is no evidence of pericardial effusion. Presence of pericardial fat pad. Mitral Valve: The mitral valve is abnormal. Mild to moderate mitral annular calcification. Mild mitral valve regurgitation. Tricuspid Valve: The tricuspid valve is grossly normal. Tricuspid valve regurgitation is mild. Aortic Valve: The aortic valve is tricuspid. Aortic valve regurgitation is not visualized. Mild aortic valve annular calcification. Aortic valve mean gradient measures 5.0 mmHg. Aortic valve peak gradient measures 9.5 mmHg. Aortic valve area, by VTI measures 2.35 cm. Pulmonic Valve: The pulmonic valve was grossly normal. Pulmonic valve regurgitation is trivial. Aorta: The aortic root is normal in size and structure. Venous: The inferior vena cava is dilated in size with greater than 50% respiratory variability, suggesting right atrial pressure of 8 mmHg. IAS/Shunts: No  atrial level shunt detected by color flow Doppler.  LEFT VENTRICLE PLAX 2D LVIDd:         4.50 cm LVIDs:         2.60 cm LV PW:         1.10 cm LV IVS:        0.90 cm LVOT diam:     1.90 cm LV SV:         67 LV SV Index:   35 LVOT Area:     2.84 cm  RIGHT VENTRICLE RV S prime:     9.03 cm/s TAPSE (M-mode): 1.8 cm LEFT ATRIUM             Index       RIGHT ATRIUM           Index LA diam:        4.80 cm 2.51 cm/m  RA Area:     22.50 cm LA Vol (A2C):   69.4 ml 36.26 ml/m RA Volume:   59.90 ml  31.29 ml/m LA Vol (A4C):   47.7 ml 24.92 ml/m LA Biplane Vol: 60.5 ml 31.61 ml/m  AORTIC VALVE AV Area (Vmax):    2.03 cm AV Area (Vmean):   2.02 cm AV Area (VTI):     2.35 cm AV Vmax:           154.00 cm/s AV Vmean:          101.000 cm/s AV VTI:  0.284 m AV Peak Grad:      9.5 mmHg AV Mean Grad:      5.0 mmHg LVOT Vmax:         110.00 cm/s LVOT Vmean:        72.000 cm/s LVOT VTI:          0.235 m LVOT/AV VTI ratio: 0.83  AORTA Ao Root diam: 3.10 cm MITRAL VALVE                TRICUSPID VALVE MV Area (PHT): 4.60 cm     TR Peak grad:   19.5 mmHg MV Decel Time: 165 msec     TR Vmax:        221.00 cm/s MV E velocity: 140.00 cm/s                             SHUNTS                             Systemic VTI:  0.24 m                             Systemic Diam: 1.90 cm Rozann Lesches MD Electronically signed by Rozann Lesches MD Signature Date/Time: 08/15/2019/5:18:17 PM    Final         Scheduled Meds: . allopurinol  300 mg Oral Daily  . amLODipine  5 mg Oral Daily  . brimonidine  1 drop Right Eye BID  . cholecalciferol  1,000 Units Oral Daily  . dorzolamide-timolol  1 drop Both Eyes BID  . famotidine  10 mg Oral Daily  . furosemide  40 mg Intravenous BID  . latanoprost  1 drop Both Eyes QHS  . mirtazapine  7.5 mg Oral QHS  . nebivolol  10 mg Oral QHS  . Netarsudil Dimesylate  1 drop Right Eye QHS  . warfarin  2 mg Oral ONCE-1600  . Warfarin - Pharmacist Dosing Inpatient   Does not apply q1600    Continuous Infusions: . sodium chloride Stopped (08/16/19 1430)     LOS: 2 days    Time spent: over 30 min    Fayrene Helper, MD [facility]   To contact the attending provider between 7A-7P or the covering provider during after hours 7P-7A, please log into the web site [email or web address] and access using universal [place] password for that web site. If you do not have the password, please call the hospital operator.  08/17/2019, 9:51 AM

## 2019-08-18 ENCOUNTER — Encounter (HOSPITAL_COMMUNITY): Payer: Self-pay | Admitting: Physical Medicine and Rehabilitation

## 2019-08-18 ENCOUNTER — Inpatient Hospital Stay (HOSPITAL_COMMUNITY): Payer: Medicare Other

## 2019-08-18 ENCOUNTER — Other Ambulatory Visit: Payer: Self-pay

## 2019-08-18 ENCOUNTER — Inpatient Hospital Stay (HOSPITAL_COMMUNITY)
Admission: RE | Admit: 2019-08-18 | Discharge: 2019-08-27 | DRG: 945 | Disposition: A | Discharge status: Other Acute Inpatient Hospital | Payer: Medicare Other | Source: Intra-hospital | Attending: Physical Medicine and Rehabilitation | Admitting: Physical Medicine and Rehabilitation

## 2019-08-18 DIAGNOSIS — I708 Atherosclerosis of other arteries: Secondary | ICD-10-CM | POA: Diagnosis not present

## 2019-08-18 DIAGNOSIS — G4733 Obstructive sleep apnea (adult) (pediatric): Secondary | ICD-10-CM

## 2019-08-18 DIAGNOSIS — M797 Fibromyalgia: Secondary | ICD-10-CM | POA: Diagnosis present

## 2019-08-18 DIAGNOSIS — I4891 Unspecified atrial fibrillation: Secondary | ICD-10-CM | POA: Diagnosis present

## 2019-08-18 DIAGNOSIS — E781 Pure hyperglyceridemia: Secondary | ICD-10-CM | POA: Diagnosis present

## 2019-08-18 DIAGNOSIS — G479 Sleep disorder, unspecified: Secondary | ICD-10-CM

## 2019-08-18 DIAGNOSIS — G894 Chronic pain syndrome: Secondary | ICD-10-CM

## 2019-08-18 DIAGNOSIS — M62830 Muscle spasm of back: Secondary | ICD-10-CM | POA: Diagnosis present

## 2019-08-18 DIAGNOSIS — I469 Cardiac arrest, cause unspecified: Secondary | ICD-10-CM | POA: Diagnosis not present

## 2019-08-18 DIAGNOSIS — G4700 Insomnia, unspecified: Secondary | ICD-10-CM | POA: Diagnosis present

## 2019-08-18 DIAGNOSIS — I10 Essential (primary) hypertension: Secondary | ICD-10-CM

## 2019-08-18 DIAGNOSIS — R5381 Other malaise: Principal | ICD-10-CM | POA: Diagnosis present

## 2019-08-18 DIAGNOSIS — G8918 Other acute postprocedural pain: Secondary | ICD-10-CM

## 2019-08-18 DIAGNOSIS — J90 Pleural effusion, not elsewhere classified: Secondary | ICD-10-CM | POA: Diagnosis not present

## 2019-08-18 DIAGNOSIS — I5033 Acute on chronic diastolic (congestive) heart failure: Secondary | ICD-10-CM

## 2019-08-18 DIAGNOSIS — Z7901 Long term (current) use of anticoagulants: Secondary | ICD-10-CM

## 2019-08-18 DIAGNOSIS — M109 Gout, unspecified: Secondary | ICD-10-CM | POA: Diagnosis present

## 2019-08-18 DIAGNOSIS — I739 Peripheral vascular disease, unspecified: Secondary | ICD-10-CM | POA: Diagnosis not present

## 2019-08-18 DIAGNOSIS — D62 Acute posthemorrhagic anemia: Secondary | ICD-10-CM | POA: Diagnosis present

## 2019-08-18 DIAGNOSIS — S79911A Unspecified injury of right hip, initial encounter: Secondary | ICD-10-CM | POA: Diagnosis not present

## 2019-08-18 DIAGNOSIS — N179 Acute kidney failure, unspecified: Secondary | ICD-10-CM | POA: Diagnosis present

## 2019-08-18 DIAGNOSIS — Z9981 Dependence on supplemental oxygen: Secondary | ICD-10-CM | POA: Diagnosis not present

## 2019-08-18 DIAGNOSIS — M62838 Other muscle spasm: Secondary | ICD-10-CM | POA: Diagnosis present

## 2019-08-18 DIAGNOSIS — Z09 Encounter for follow-up examination after completed treatment for conditions other than malignant neoplasm: Secondary | ICD-10-CM

## 2019-08-18 DIAGNOSIS — I482 Chronic atrial fibrillation, unspecified: Secondary | ICD-10-CM

## 2019-08-18 DIAGNOSIS — I5043 Acute on chronic combined systolic (congestive) and diastolic (congestive) heart failure: Secondary | ICD-10-CM | POA: Diagnosis not present

## 2019-08-18 DIAGNOSIS — Z87891 Personal history of nicotine dependence: Secondary | ICD-10-CM | POA: Diagnosis not present

## 2019-08-18 DIAGNOSIS — K219 Gastro-esophageal reflux disease without esophagitis: Secondary | ICD-10-CM

## 2019-08-18 DIAGNOSIS — S43014A Anterior dislocation of right humerus, initial encounter: Secondary | ICD-10-CM | POA: Diagnosis not present

## 2019-08-18 DIAGNOSIS — Z6835 Body mass index (BMI) 35.0-35.9, adult: Secondary | ICD-10-CM | POA: Diagnosis not present

## 2019-08-18 DIAGNOSIS — I70201 Unspecified atherosclerosis of native arteries of extremities, right leg: Secondary | ICD-10-CM | POA: Diagnosis not present

## 2019-08-18 DIAGNOSIS — Z96643 Presence of artificial hip joint, bilateral: Secondary | ICD-10-CM | POA: Diagnosis present

## 2019-08-18 DIAGNOSIS — F419 Anxiety disorder, unspecified: Secondary | ICD-10-CM | POA: Diagnosis present

## 2019-08-18 DIAGNOSIS — Z9049 Acquired absence of other specified parts of digestive tract: Secondary | ICD-10-CM | POA: Diagnosis not present

## 2019-08-18 DIAGNOSIS — Z9989 Dependence on other enabling machines and devices: Secondary | ICD-10-CM

## 2019-08-18 DIAGNOSIS — I517 Cardiomegaly: Secondary | ICD-10-CM | POA: Diagnosis not present

## 2019-08-18 DIAGNOSIS — Z96651 Presence of right artificial knee joint: Secondary | ICD-10-CM | POA: Diagnosis present

## 2019-08-18 DIAGNOSIS — R52 Pain, unspecified: Secondary | ICD-10-CM

## 2019-08-18 DIAGNOSIS — R0603 Acute respiratory distress: Secondary | ICD-10-CM

## 2019-08-18 DIAGNOSIS — I5032 Chronic diastolic (congestive) heart failure: Secondary | ICD-10-CM | POA: Diagnosis present

## 2019-08-18 DIAGNOSIS — J9811 Atelectasis: Secondary | ICD-10-CM | POA: Diagnosis not present

## 2019-08-18 DIAGNOSIS — S8991XA Unspecified injury of right lower leg, initial encounter: Secondary | ICD-10-CM | POA: Diagnosis not present

## 2019-08-18 DIAGNOSIS — I110 Hypertensive heart disease with heart failure: Secondary | ICD-10-CM | POA: Diagnosis not present

## 2019-08-18 DIAGNOSIS — W19XXXA Unspecified fall, initial encounter: Secondary | ICD-10-CM

## 2019-08-18 DIAGNOSIS — R791 Abnormal coagulation profile: Secondary | ICD-10-CM

## 2019-08-18 DIAGNOSIS — R0902 Hypoxemia: Secondary | ICD-10-CM | POA: Diagnosis present

## 2019-08-18 DIAGNOSIS — T84028A Dislocation of other internal joint prosthesis, initial encounter: Secondary | ICD-10-CM | POA: Diagnosis not present

## 2019-08-18 LAB — BASIC METABOLIC PANEL
Anion gap: 11 (ref 5–15)
BUN: 20 mg/dL (ref 8–23)
CO2: 36 mmol/L — ABNORMAL HIGH (ref 22–32)
Calcium: 8.7 mg/dL — ABNORMAL LOW (ref 8.9–10.3)
Chloride: 96 mmol/L — ABNORMAL LOW (ref 98–111)
Creatinine, Ser: 0.84 mg/dL (ref 0.44–1.00)
GFR calc Af Amer: >60 mL/min (ref >60)
GFR calc non Af Amer: >60 mL/min (ref >60)
Glucose, Bld: 129 mg/dL — ABNORMAL HIGH (ref 70–99)
Potassium: 3.7 mmol/L (ref 3.5–5.1)
Sodium: 143 mmol/L (ref 135–145)

## 2019-08-18 LAB — CBC
HCT: 29.7 % — ABNORMAL LOW (ref 36.0–46.0)
Hemoglobin: 8.4 g/dL — ABNORMAL LOW (ref 12.0–15.0)
MCH: 27 pg (ref 26.0–34.0)
MCHC: 28.3 g/dL — ABNORMAL LOW (ref 30.0–36.0)
MCV: 95.5 fL (ref 80.0–100.0)
Platelets: 188 10*3/uL (ref 150–400)
RBC: 3.11 MIL/uL — ABNORMAL LOW (ref 3.87–5.11)
RDW: 17.4 % — ABNORMAL HIGH (ref 11.5–15.5)
WBC: 4.4 10*3/uL (ref 4.0–10.5)
nRBC: 0.5 % — ABNORMAL HIGH (ref 0.0–0.2)

## 2019-08-18 LAB — MAGNESIUM: Magnesium: 1.7 mg/dL (ref 1.7–2.4)

## 2019-08-18 LAB — PROTIME-INR
INR: 2.6 — ABNORMAL HIGH (ref 0.8–1.2)
Prothrombin Time: 27 seconds — ABNORMAL HIGH (ref 11.4–15.2)

## 2019-08-18 MED ORDER — PROCHLORPERAZINE MALEATE 5 MG PO TABS: 5.0000 mg | ORAL_TABLET | Freq: Four times a day (QID) | ORAL | Status: DC | PRN

## 2019-08-18 MED ORDER — LORATADINE 10 MG PO TABS: 10.0000 mg | ORAL_TABLET | Freq: Every day | ORAL | Status: DC | PRN

## 2019-08-18 MED ORDER — ALLOPURINOL 300 MG PO TABS
300.0000 mg | ORAL_TABLET | Freq: Every day | ORAL | Status: DC
  Administered 2019-08-19 – 2019-08-26 (×8): 300 mg via ORAL
  Filled 2019-08-18 (×8): qty 1

## 2019-08-18 MED ORDER — WARFARIN SODIUM 2.5 MG PO TABS: 2.5000 mg | ORAL_TABLET | ORAL | 0 refills | Status: AC

## 2019-08-18 MED ORDER — NETARSUDIL DIMESYLATE 0.02 % OP SOLN
1.0000 [drp] | Freq: Every day | OPHTHALMIC | Status: DC
  Administered 2019-08-18 – 2019-08-25 (×8): 1 [drp] via OPHTHALMIC

## 2019-08-18 MED ORDER — MAGNESIUM OXIDE 400 (241.3 MG) MG PO TABS
200.0000 mg | ORAL_TABLET | Freq: Two times a day (BID) | ORAL | Status: DC
  Administered 2019-08-18 – 2019-08-26 (×16): 200 mg via ORAL
  Filled 2019-08-18 (×16): qty 1

## 2019-08-18 MED ORDER — MELATONIN 5 MG PO TABS: 5.0000 mg | ORAL_TABLET | Freq: Every evening | ORAL | Status: DC | PRN

## 2019-08-18 MED ORDER — PROCHLORPERAZINE 25 MG RE SUPP: 12.5000 mg | Freq: Four times a day (QID) | RECTAL | Status: DC | PRN

## 2019-08-18 MED ORDER — VITAMIN D 25 MCG (1000 UNIT) PO TABS
1000.0000 [IU] | ORAL_TABLET | Freq: Every day | ORAL | Status: DC
  Administered 2019-08-19 – 2019-08-26 (×8): 1000 [IU] via ORAL
  Filled 2019-08-18 (×8): qty 1

## 2019-08-18 MED ORDER — GUAIFENESIN-DM 100-10 MG/5ML PO SYRP: 5.0000 mL | ORAL_SOLUTION | Freq: Four times a day (QID) | ORAL | Status: DC | PRN

## 2019-08-18 MED ORDER — WARFARIN SODIUM 3 MG PO TABS
3.0000 mg | ORAL_TABLET | Freq: Once | ORAL | Status: DC
  Administered 2019-08-18: 3 mg via ORAL
  Filled 2019-08-18: qty 1

## 2019-08-18 MED ORDER — FUROSEMIDE 40 MG PO TABS: 40.0000 mg | ORAL_TABLET | Freq: Every day | ORAL | Status: AC

## 2019-08-18 MED ORDER — PANTOPRAZOLE SODIUM 40 MG PO TBEC
40.0000 mg | DELAYED_RELEASE_TABLET | Freq: Every day | ORAL | Status: DC
  Administered 2019-08-19 – 2019-08-26 (×8): 40 mg via ORAL
  Filled 2019-08-18 (×8): qty 1

## 2019-08-18 MED ORDER — FLEET ENEMA 7-19 GM/118ML RE ENEM: 1.0000 | ENEMA | Freq: Once | RECTAL | Status: DC | PRN

## 2019-08-18 MED ORDER — LATANOPROST 0.005 % OP SOLN
1.0000 [drp] | Freq: Every day | OPHTHALMIC | Status: DC
  Administered 2019-08-18 – 2019-08-25 (×8): 1 [drp] via OPHTHALMIC
  Filled 2019-08-18: qty 2.5

## 2019-08-18 MED ORDER — BRIMONIDINE TARTRATE 0.2 % OP SOLN
1.0000 [drp] | Freq: Two times a day (BID) | OPHTHALMIC | Status: DC
  Administered 2019-08-18 – 2019-08-26 (×16): 1 [drp] via OPHTHALMIC
  Filled 2019-08-18: qty 5

## 2019-08-18 MED ORDER — FUROSEMIDE 40 MG PO TABS
40.0000 mg | ORAL_TABLET | Freq: Every day | ORAL | Status: DC
  Administered 2019-08-19 – 2019-08-26 (×8): 40 mg via ORAL
  Filled 2019-08-18 (×8): qty 1

## 2019-08-18 MED ORDER — BENZONATATE 100 MG PO CAPS
100.0000 mg | ORAL_CAPSULE | Freq: Three times a day (TID) | ORAL | Status: DC | PRN
  Filled 2019-08-18: qty 1

## 2019-08-18 MED ORDER — AMLODIPINE BESYLATE 5 MG PO TABS
5.0000 mg | ORAL_TABLET | Freq: Every day | ORAL | Status: DC
  Administered 2019-08-19 – 2019-08-26 (×8): 5 mg via ORAL
  Filled 2019-08-18 (×8): qty 1

## 2019-08-18 MED ORDER — HYDROCODONE-ACETAMINOPHEN 5-325 MG PO TABS
0.5000 | ORAL_TABLET | Freq: Four times a day (QID) | ORAL | Status: DC | PRN
  Administered 2019-08-19 – 2019-08-22 (×8): 1 via ORAL
  Administered 2019-08-22: 0.5 via ORAL
  Administered 2019-08-22 – 2019-08-26 (×11): 1 via ORAL
  Filled 2019-08-18 (×23): qty 1

## 2019-08-18 MED ORDER — FAMOTIDINE 20 MG PO TABS
10.0000 mg | ORAL_TABLET | Freq: Every day | ORAL | Status: DC
  Administered 2019-08-19 – 2019-08-26 (×8): 10 mg via ORAL
  Filled 2019-08-18 (×8): qty 1

## 2019-08-18 MED ORDER — PANTOPRAZOLE SODIUM 40 MG PO TBEC: 40.0000 mg | DELAYED_RELEASE_TABLET | Freq: Every day | ORAL | Status: AC

## 2019-08-18 MED ORDER — TRIAMCINOLONE ACETONIDE 55 MCG/ACT NA AERO
2.0000 | INHALATION_SPRAY | Freq: Every day | NASAL | Status: DC | PRN
  Filled 2019-08-18: qty 21.6

## 2019-08-18 MED ORDER — FUROSEMIDE 10 MG/ML IJ SOLN
40.0000 mg | Freq: Once | INTRAMUSCULAR | Status: AC
  Administered 2019-08-18: 40 mg via INTRAVENOUS
  Filled 2019-08-18: qty 4

## 2019-08-18 MED ORDER — POLYETHYLENE GLYCOL 3350 17 G PO PACK: 17.0000 g | PACK | Freq: Every day | ORAL | Status: DC | PRN

## 2019-08-18 MED ORDER — DIPHENHYDRAMINE HCL 12.5 MG/5ML PO ELIX: 12.5000 mg | ORAL_SOLUTION | Freq: Four times a day (QID) | ORAL | Status: DC | PRN

## 2019-08-18 MED ORDER — BISACODYL 10 MG RE SUPP: 10.0000 mg | Freq: Every day | RECTAL | Status: DC | PRN

## 2019-08-18 MED ORDER — WARFARIN - PHARMACIST DOSING INPATIENT: Freq: Every day | Status: DC

## 2019-08-18 MED ORDER — SACCHAROMYCES BOULARDII 250 MG PO CAPS
250.0000 mg | ORAL_CAPSULE | Freq: Every day | ORAL | Status: DC
  Administered 2019-08-19 – 2019-08-26 (×8): 250 mg via ORAL
  Filled 2019-08-18 (×8): qty 1

## 2019-08-18 MED ORDER — WARFARIN SODIUM 3 MG PO TABS: 3.0000 mg | ORAL_TABLET | Freq: Once | ORAL | Status: DC

## 2019-08-18 MED ORDER — NEBIVOLOL HCL 10 MG PO TABS
10.0000 mg | ORAL_TABLET | Freq: Every day | ORAL | Status: DC
  Administered 2019-08-18 – 2019-08-25 (×8): 10 mg via ORAL
  Filled 2019-08-18 (×11): qty 1

## 2019-08-18 MED ORDER — ACETAMINOPHEN 325 MG PO TABS: 325.0000 mg | ORAL_TABLET | ORAL | Status: DC | PRN

## 2019-08-18 MED ORDER — IPRATROPIUM BROMIDE 0.06 % NA SOLN
1.0000 | Freq: Every day | NASAL | Status: DC | PRN
  Filled 2019-08-18: qty 15

## 2019-08-18 MED ORDER — DORZOLAMIDE HCL-TIMOLOL MAL 2-0.5 % OP SOLN
1.0000 [drp] | Freq: Two times a day (BID) | OPHTHALMIC | Status: DC
  Administered 2019-08-18 – 2019-08-26 (×16): 1 [drp] via OPHTHALMIC
  Filled 2019-08-18: qty 10

## 2019-08-18 MED ORDER — POLYSACCHARIDE IRON COMPLEX 150 MG PO CAPS
150.0000 mg | ORAL_CAPSULE | Freq: Every day | ORAL | Status: DC
  Administered 2019-08-19 – 2019-08-26 (×8): 150 mg via ORAL
  Filled 2019-08-18 (×8): qty 1

## 2019-08-18 MED ORDER — PROCHLORPERAZINE EDISYLATE 10 MG/2ML IJ SOLN
5.0000 mg | Freq: Four times a day (QID) | INTRAMUSCULAR | Status: DC | PRN
  Administered 2019-08-26: 5 mg via INTRAMUSCULAR
  Filled 2019-08-18: qty 2

## 2019-08-18 MED ORDER — ALUM & MAG HYDROXIDE-SIMETH 200-200-20 MG/5ML PO SUSP: 30.0000 mL | ORAL | Status: DC | PRN

## 2019-08-18 MED ORDER — HYDROCODONE-ACETAMINOPHEN 5-325 MG PO TABS: 1.0000 | ORAL_TABLET | ORAL | 0 refills | Status: AC | PRN

## 2019-08-18 NOTE — H&P (Signed)
Physical Medicine and Rehabilitation Admission H&P    Chief Complaint  Patient presents with  . debility    HPI:  [name] is an 85 year old female with history of HTN, OSA-on CPAP, CAF- on coumadin, chronic diastolic CHF, morbid obesity, DJD/DDD with chronic pain, recent admission for left femur Fx s/p ORIF and right humerus fx with reverse total shoulder on 07/20/2019 and was sent to [facility] for rehab.  Patient came in for review and patient.  She reports that she was have increase in SOB with weigh gain and was discharged to home on 08/14/2019 but readmitted on 08/15/2019 with CHF exacerbation.  She had progressive dyspnea.  She was treated with IV diuresis as well as non-rebreather mask.  She was found to have hypomagnesemia which was supplemented, anemia with Hgb 8.7 and reported problem swallowing.  MBS done revealing normal swallow and symptoms felt to be due to GERD. She has been treated with IV diuresis with resolution of dyspnea and continues to require supplemental oxygen.  Weight down to 187 lbs but follow up CXR  today with increasing infiltrates.  Therapy ongoing and patient continues to be limited by balance deficits and requires frequent rest breaks. Reported to have to hypoxia with SOB and fatigue with minimal activity. CIR recommended due to functional decline.  Please see preadmission assessment from earlier today as well.   Review of Systems  Constitutional: Negative for chills and fever.  HENT: Positive for hearing loss. Negative for tinnitus.   Eyes: Negative for blurred vision.  Respiratory: Positive for shortness of breath (with activity). Negative for cough and wheezing.   Cardiovascular: Negative for chest pain and palpitations.  Gastrointestinal: Positive for heartburn. Negative for constipation and nausea.  Genitourinary: Negative for dysuria and urgency.       Gets up once at night.   Musculoskeletal: Positive for back pain, joint pain and myalgias.  Skin:  Negative for rash.  Neurological: Positive for weakness. Negative for dizziness and headaches.  Endo/Heme/Allergies: Negative for environmental allergies. Does not bruise/bleed easily.  Psychiatric/Behavioral: The patient is not nervous/anxious and does not have insomnia.   All other systems reviewed and are negative.   Past Medical History:  Diagnosis Date  . Atrial fibrillation ([place])   . CAP (community acquired pneumonia) 12/27/2018  . Carotid artery occlusion   . Chronic diastolic CHF (congestive heart failure) (HCC)    Hypertensive heart disease 02-12-14  . Crohn's disease ([place])   . Detached retina   . Fibromyalgia   . GERD (gastroesophageal reflux disease)   . Gout   . H/O hiatal hernia   . High triglycerides   . History of stomach ulcers 1950's  . Hypertension   . Long term current use of anticoagulant   . Obstructive sleep apnea on CPAP   . Osteoarthritis    PAIN AND OA LEF T HIP AND BOTH SHOULDERS ARE BONE ON BONE AND PAINFUL  . Peripheral vascular disease (HCC)    KNOWN RIGHT INTERNAL CAROTID ARTERY OCCLUSION (NO STROKE)  --40 TO 59% STENOSIS LEFT ICA-FOLLOWED BY DR. EARLY WITH DOPPLER STUDY EVERY 6 MONTHS  . Pulmonary embolism ([place]) 2011   a. Hx of PE in 02/2009 after R hip surgery, venous dopplers negative, long-term Coumadin.  [name] Kitchen PVC (premature ventricular contraction)    PT STATES HX OF PVC'S ON EKG  . Recurrent upper respiratory infection (URI)    BRONCHITIS FEB 2013--SLIGHT COUGH NON-PRODUCTIVE NOW  . Recurrent UTI (urinary tract infection)    "  on daily medicine" (05/14/2012)  . Tinnitus   . Vertigo     Past Surgical History:  Procedure Laterality Date  . APPENDECTOMY  1950's  . BACK SURGERY  10/29/06   Central and foraminal decompression L3-L4, L4-L5, and L5-S1 with inspection of L4-L5 and L5-S1 disc on the right  . CARDIAC CATHETERIZATION  1970's   "maybe 2" (05/14/2012)  . CARDIAC CATHETERIZATION    . CATARACT EXTRACTION W/ INTRAOCULAR LENS IMPLANT Right 2009   . CHOLECYSTECTOMY  ~ 1988  . DECOMPRESSIVE LUMBAR LAMINECTOMY LEVEL 3  ~ 2002  . DILATION AND CURETTAGE OF UTERUS     "several; from miscarriages" (05/14/2012)  . EXCISION MORTON'S NEUROMA Right 05/20/00   "foot" (05/14/2012)  . EYE SURGERY Left 2014   Detached retina  . EYE SURGERY Left August 23, 2013   Cataract  . FEMUR FRACTURE SURGERY Right 02/21/09   Open reduction internal fixation of right periprosthetic  femur fracture utilizing Zimmer cables times fiv  . FRACTURE SURGERY  2011   Right Femur Fx  . HAMMER TOE SURGERY Left 10/25/08   "toe next to big to" (05/14/2012)  . JOINT REPLACEMENT  2009   Right Hip replacement  . JOINT REPLACEMENT  2012   Right knee replacement  . JOINT REPLACEMENT  06/17/11   Left Hip replacement  . KNEE ARTHROSCOPY Right 07/26/05  . REPLACEMENT TOTAL KNEE Right 02/19/2010  . REVISION TOTAL SHOULDER TO REVERSE TOTAL SHOULDER Right 07/20/2019   Procedure: REVERSE TOTAL SHOULDER REPLACEMENT;  Surgeon: Nicholes Stairs, MD;  Location: [facility];  Service: Orthopedics;  Laterality: Right;  . SPINE SURGERY    . [place]?  . TOTAL HIP ARTHROPLASTY Right 12/08/02   Osteonics total hip replacement  . TOTAL HIP ARTHROPLASTY  06/17/2011   Procedure: TOTAL HIP ARTHROPLASTY;  Surgeon: Gearlean Alf, MD;  Location: [facility];  Service: Orthopedics;  Laterality: Left;  . TOTAL SHOULDER ARTHROPLASTY Left 05/14/2012  . TOTAL SHOULDER ARTHROPLASTY Left 05/14/2012   Procedure: TOTAL SHOULDER ARTHROPLASTY;  Surgeon: Marin Shutter, MD;  Location: [facility];  Service: Orthopedics;  Laterality: Left;  [name] Kitchen VAGINAL HYSTERECTOMY  1971    Family History  Problem Relation Age of Onset  . Heart disease Father        Heart Disease before age 31  . Heart attack Father 66  . Hypertension Father   . Peripheral vascular disease Father        Right  leg amputation  . Heart disease Mother        AAA  . Stroke Mother   . Aneurysm Mother   . Hypertension Mother   . AAA  (abdominal aortic aneurysm) Mother   . Diabetes Paternal Grandfather   . Hypertension Brother   . Heart disease Brother        Heart Disease before age 46  . Hyperlipidemia Brother   . Heart attack Brother   . Hypertension Brother   . Diabetes Son     Social History:  Married. Retired Marine scientist. Used cane in home and walker out of home prior to fracture. She reports that she quit smoking about 33 years ago. Her smoking use included cigarettes. She has a 7.50 pack-year smoking history. She has never used smokeless tobacco. She reports current alcohol use. She reports that she does not use drugs.    Allergies  Allergen Reactions  . Codeine     NAUSEA  . Cymbalta [Duloxetine Hcl]     Nausea  VOMITING AND ABDOMINAL PAIN, HEADACHE, JUST ABOUT EVERY SIDE EFFECT THE DRUG HAS  . Diltiazem Cd [Diltiazem Hcl Er Beads]     Heavy legs, cough  . Duloxetine     Other reaction(s): Other (See Comments) Nausea VOMITING AND ABDOMINAL PAIN, HEADACHE, JUST ABOUT EVERY SIDE EFFECT THE DRUG HAS  . Metoprolol     Legs like cement  . Mirtazapine Other (See Comments)    nightmares  . Nortriptyline     Nightmares  . Penicillins     RASH  & ITCHING   --PT STATES SHE CAN TAKE KEFLEX PO AND IV CEPHALOSPORINS    Tolerate IV ancef on 07/20/2019  . Statins     Leg cramps  . Tetanus Toxoids     SWELLING, REDNESS  WHOLE ARM  . Ciprofloxacin Rash    RASH  . Lyrica [Pregabalin] Diarrhea, Nausea And Vomiting and Rash    Medications Prior to Admission  Medication Sig Dispense Refill  . allopurinol (ZYLOPRIM) 300 MG tablet Take 300 mg by mouth daily.    [name] Kitchen amLODipine (NORVASC) 5 MG tablet Take 5 mg by mouth daily.    . benzonatate (TESSALON) 100 MG capsule Take 100 mg by mouth 3 (three) times daily as needed for cough.    . brimonidine (ALPHAGAN) 0.2 % ophthalmic solution Place 1 drop into the right eye 2 (two) times daily.     . cholecalciferol (VITAMIN D3) 25 MCG (1000 UNIT) tablet Take 1,000 Units by mouth  daily.    . cimetidine (TAGAMET) 200 MG tablet Take 200 mg by mouth 2 (two) times daily.    [name] Kitchen CRANBERRY CONCENTRATE PO Take 1 tablet by mouth daily.    . dorzolamide-timolol (COSOPT) 22.3-6.8 MG/ML ophthalmic solution Place 1 drop into both eyes 2 (two) times daily.     . furosemide (LASIX) 20 MG tablet Take 1 tab (34m)  daily for a week, then continue 1/2 tab (124m daily (Patient taking differently: Take 10-20 mg by mouth daily as needed for fluid (per weight gain). ) 90 tablet 1  . ipratropium (ATROVENT) 0.06 % nasal spray Place 1 spray into both nostrils daily as needed for rhinitis.     . [name] Kitchenetoconazole (NIZORAL) 2 % cream Apply 1 application topically See admin instructions. Use 1 to 2 times daily    . loratadine (CLARITIN) 10 MG tablet Take 10 mg by mouth daily as needed for allergies.    . [name] KitchenUMIGAN 0.01 % SOLN Place 1 drop into both eyes at bedtime.    . Multiple Vitamin (MULTIVITAMIN WITH MINERALS) TABS tablet Take 1 tablet by mouth daily.    . nebivolol (BYSTOLIC) 10 MG tablet Take 1 tablet (10 mg total) by mouth at bedtime. 90 tablet 3  . potassium chloride (KLOR-CON) 10 MEQ tablet Take 2 tablets (20 mEq total) by mouth daily. 60 tablet 1  . RHOPRESSA 0.02 % SOLN Place 1 drop into the right eye at bedtime.    . triamcinolone (NASACORT ALLERGY 24HR) 55 MCG/ACT AERO nasal inhaler Place 2 sprays into the nose daily as needed (allergies).     . [DISCONTINUED] HYDROcodone-acetaminophen (NORCO/VICODIN) 5-325 MG tablet Take 1 tablet by mouth every 4 (four) hours as needed for moderate pain. 35 tablet 0  . [DISCONTINUED] warfarin (COUMADIN) 2.5 MG tablet TAKE 1 TO 1&1/2 TABLETS DAILY AS DIRECTED BY COUMADIN CLINIC. (Patient taking differently: Take 2.5 mg by mouth See admin instructions. Takes 2.5 mg daily except Monday and Friday. Takes 3.75 mgs on Mondays & Fridays.) 120  tablet 0  . mirtazapine (REMERON) 7.5 MG tablet Take 7.5 mg by mouth at bedtime. (Patient not taking: Reported on 08/15/2019)      . mupirocin ointment (BACTROBAN) 2 % Apply 1 application topically 2 (two) times daily. (Patient not taking: Reported on 08/15/2019) 30 g 2  . silver sulfADIAZINE (SILVADENE) 1 % cream Apply 1 application topically daily. (Patient not taking: Reported on 08/15/2019) 50 g 0    Drug Regimen Review  Drug regimen was reviewed and remains appropriate with no significant issues identified  Home: Home Living Family/patient expects to be discharged to:: Private residence Living Arrangements: Spouse/significant other Available Help at Discharge: Family, Available 24 hours/day Type of Home: House Home Access: Stairs to enter [employer] of Steps: 5 Entrance Stairs-Rails: Left Home Layout: One level Bathroom Shower/Tub: Chiropodist: [place]: Environmental consultant - 2 wheels, Environmental consultant - 4 wheels, Grab bars - tub/shower, Grab bars - toilet, Cane - single point, Industrial/product designer History: Prior Function Comments: pt previously in [facility] for rehab, working with OT/PT on mobilizing with [place] and ADL completion, pt reports was home less than a day from rehab but couldn't get into her home or move around (non emergency EMT had to assist for getting into home)   Functional Status:  Mobility: Bed Mobility Overal bed mobility: Needs Assistance Bed Mobility: Supine to Sit Supine to sit: Mod assist, HOB elevated General bed mobility comments: ModA for LUE support to elevate trunk Transfers Overall transfer level: Needs assistance Equipment used: Straight cane, None Transfers: Sit to/from Stand Sit to Stand: Min assist, Min guard Stand pivot transfers: Min assist General transfer comment: Initial minA to elevate trunk and maintain balance upon standing, progressing to min guard on subsequent trials from recliner, improving technique with SPC Ambulation/Gait Ambulation/Gait assistance: Min guard, Min assist, Mod assist, +2 safety/equipment Gait Distance (Feet):  20 Feet (+ 42' + 50') Assistive device: Straight cane Gait Pattern/deviations: Step-through pattern, Decreased stride length, Trunk flexed, Staggering right, Staggering left General Gait Details: Slow, unsteady gait with SPC and frequent min-modA to prevent LOB; +2 assist for safety/chair follow. 3x seated rest break secondary to fatigue and DOE. SpO2 down to 86% on 2L O2 [place], returning to 89-94% with seated rest and deep breathing Gait velocity: Decreased Gait velocity interpretation: <1.31 ft/sec, indicative of household ambulator    ADL: ADL Overall ADL's : Needs assistance/impaired Eating/Feeding: Set up, Sitting Grooming: Minimal assistance, Sitting Upper Body Bathing: Moderate assistance, Sitting Lower Body Bathing: Maximal assistance, Sitting/lateral leans, Sit to/from stand, +2 for safety/equipment, +2 for physical assistance Upper Body Dressing : Total assistance, Sitting Upper Body Dressing Details (indicate cue type and reason): assist for sling management  Lower Body Dressing: Maximal assistance, Sitting/lateral leans, Sit to/from stand, +2 for safety/equipment, +2 for physical assistance Lower Body Dressing Details (indicate cue type and reason): minA for standing balance  Toilet Transfer: Minimal assistance, Stand-pivot, BSC Toilet Transfer Details (indicate cue type and reason): MIN A for balance to pivot to [facility] with HHA Toileting- Clothing Manipulation and Hygiene: Total assistance, Sit to/from stand Toileting - Clothing Manipulation Details (indicate cue type and reason): total A for posterior pericare post BM Functional mobility during ADLs: Minimal assistance (stand pivot only) General ADL Comments: pt limited by fatigue and decreased activity tolerance  Cognition: Cognition Overall Cognitive Status: Impaired/Different from baseline Cognition Arousal/Alertness: Awake/alert Behavior During Therapy: WFL for tasks assessed/performed Overall Cognitive Status:  Impaired/Different from baseline Area of Impairment: Attention Current Attention Level:  Selective General Comments: WFL for majority of simple tasks, some difficulty multi-tasking noted   Blood pressure (!) 108/91, pulse (!) 104, temperature 98.1 F (36.7 C), temperature source Oral, resp. rate 20, height 5' 2"  (1.575 m), weight 84.8 kg, SpO2 93 %. Physical Exam Vitals and nursing note reviewed.  Constitutional:      Appearance: She is obese.  HENT:     Head: Normocephalic.     Comments: Left extremity normal Right extremity normal    Right Ear: External ear normal.     Left Ear: External ear normal.     Nose: Nose normal.  Eyes:     Extraocular Movements: Extraocular movements intact.     Comments: No discharge bilateral eyes  Pulmonary:     Effort: Pulmonary effort is normal. No tachypnea or respiratory distress.  Abdominal:     General: Abdomen is flat. There is no distension.     Comments: Large pannus with irritation bilateral inguinal folds.   Musculoskeletal:     Cervical back: Normal range of motion and neck supple.     Comments: Left shoulder edema Left wrist edema  Skin:    General: Skin is warm and dry.     Comments: Right shoulder incision healed.  Left forearm ecchymosis and hematoma v/s fluid filled bulla on left wrist.   Neurological:     Mental Status: She is alert.     Comments: Alert Motor: Right upper extremity limited by sling proximally, moving them freely. Left upper extremity: 5/5 proximal distal Left lower extremity: 4-4+/5 proximal distal Right lower extremity: 5/5 proximal distal  Psychiatric:        Mood and Affect: Affect is blunt.        Behavior: Behavior normal.     Results for orders placed or performed during the hospital encounter of 08/15/19 (from the past 48 hour(s))  Magnesium     Status: None   Collection Time: 08/17/19  4:13 AM  Result Value Ref Range   Magnesium 1.9 1.7 - 2.4 mg/dL    Comment: Performed at [facility], [place] [address], [place], [place] [address]  Basic metabolic panel     Status: Abnormal   Collection Time: 08/17/19  4:13 AM  Result Value Ref Range   Sodium 143 135 - 145 mmol/L   Potassium 4.7 3.5 - 5.1 mmol/L   Chloride 100 98 - 111 mmol/L   CO2 35 (H) 22 - 32 mmol/L   Glucose, Bld 116 (H) 70 - 99 mg/dL    Comment: Glucose reference range applies only to samples taken after fasting for at least 8 hours.   BUN 25 (H) 8 - 23 mg/dL   Creatinine, Ser 0.79 0.44 - 1.00 mg/dL   Calcium 8.4 (L) 8.9 - 10.3 mg/dL   GFR calc non Af Amer >60 >60 mL/min   GFR calc Af Amer >60 >60 mL/min   Anion gap 8 5 - 15    Comment: Performed at [facility] [address], [place], [place] [address]  Protime-INR     Status: Abnormal   Collection Time: 08/17/19  4:13 AM  Result Value Ref Range   Prothrombin Time 29.5 (H) 11.4 - 15.2 seconds   INR 2.9 (H) 0.8 - 1.2    Comment: (NOTE) INR goal varies based on device and disease states. Performed at [facility], [place] [address] [place], [place] [address]   CBC with Differential/Platelet     Status: Abnormal  Collection Time: 08/17/19  4:13 AM  Result Value Ref Range   WBC 4.0 4.0 - 10.5 K/uL   RBC 3.08 (L) 3.87 - 5.11 MIL/uL   Hemoglobin 8.3 (L) 12.0 - 15.0 g/dL   HCT 29.5 (L) 36 - 46 %   MCV 95.8 80.0 - 100.0 fL   MCH 26.9 26.0 - 34.0 pg   MCHC 28.1 (L) 30.0 - 36.0 g/dL   RDW 17.9 (H) 11.5 - 15.5 %   Platelets 179 150 - 400 K/uL   nRBC 0.5 (H) 0.0 - 0.2 %   Neutrophils Relative % 64 %   Neutro Abs 2.6 1.7 - 7.7 K/uL   Lymphocytes Relative 19 %   Lymphs Abs 0.8 0.7 - 4.0 K/uL   Monocytes Relative 9 %   Monocytes Absolute 0.4 0 - 1 K/uL   Eosinophils Relative 5 %   Eosinophils Absolute 0.2 0 - 0 K/uL   Basophils Relative 1 %   Basophils Absolute 0.0 0 - 0 K/uL   Immature Granulocytes 2 %   Abs Immature Granulocytes 0.06 0.00 - 0.07 K/uL    Comment: Performed at [facility], [address] [place], [place]  [address]  Phosphorus     Status: None   Collection Time: 08/17/19  4:13 AM  Result Value Ref Range   Phosphorus 3.4 2.5 - 4.6 mg/dL    Comment: Performed at [facility] [address], [place], [place] [address]  Basic metabolic panel     Status: Abnormal   Collection Time: 08/18/19  8:36 AM  Result Value Ref Range   Sodium 143 135 - 145 mmol/L   Potassium 3.7 3.5 - 5.1 mmol/L   Chloride 96 (L) 98 - 111 mmol/L   CO2 36 (H) 22 - 32 mmol/L   Glucose, Bld 129 (H) 70 - 99 mg/dL    Comment: Glucose reference range applies only to samples taken after fasting for at least 8 hours.   BUN 20 8 - 23 mg/dL   Creatinine, Ser 0.84 0.44 - 1.00 mg/dL   Calcium 8.7 (L) 8.9 - 10.3 mg/dL   GFR calc non Af Amer >60 >60 mL/min   GFR calc Af Amer >60 >60 mL/min   Anion gap 11 5 - 15    Comment: Performed at [facility] [address], [place], [place] [address]  Magnesium     Status: None   Collection Time: 08/18/19  8:36 AM  Result Value Ref Range   Magnesium 1.7 1.7 - 2.4 mg/dL    Comment: Performed at [facility] [address], [place], [place] [address]  Protime-INR     Status: Abnormal   Collection Time: 08/18/19  8:36 AM  Result Value Ref Range   Prothrombin Time 27.0 (H) 11.4 - 15.2 seconds   INR 2.6 (H) 0.8 - 1.2    Comment: (NOTE) INR goal varies based on device and disease states. Performed at [facility], [place] [address], [place], [place] [address]   CBC     Status: Abnormal   Collection Time: 08/18/19  8:36 AM  Result Value Ref Range   WBC 4.4 4.0 - 10.5 K/uL   RBC 3.11 (L) 3.87 - 5.11 MIL/uL   Hemoglobin 8.4 (L) 12.0 - 15.0 g/dL   HCT 29.7 (L) 36 - 46 %   MCV 95.5 80.0 - 100.0 fL   MCH 27.0 26.0 - 34.0 pg   MCHC 28.3 (L) 30.0 -  36.0 g/dL   RDW 17.4 (H) 11.5 - 15.5 %   Platelets 188 150 - 400 K/uL   nRBC 0.5 (H) 0.0 - 0.2 %    Comment: Performed at [facility] [address] [place], [place] [address]   DG CHEST PORT 1 VIEW  Result  Date: 08/18/2019 CLINICAL DATA:  Hypoxia EXAM: PORTABLE CHEST 1 VIEW COMPARISON:  Portable exam 0554 hours compared to 08/16/2019 FINDINGS: Enlargement of cardiac silhouette. Atherosclerotic calcification aorta. Mediastinal contours normal. BILATERAL pulmonary infiltrates which could represent pulmonary edema or atypical infection. More focal opacities are seen at the lung bases question confluent infiltrate versus atelectasis. Minimal LEFT pleural effusion. No pneumothorax. Bones demineralized with BILATERAL shoulder prostheses noted. IMPRESSION: Enlargement of cardiac silhouette with favor pulmonary edema though atypical infection could have a similar pattern. Increased infiltrate versus atelectasis at lung bases with swelling of pleural effusion. Electronically Signed   By: Lavonia Dana M.D.   On: 08/18/2019 08:34       Medical Problem List and Plan: 1.  Balance deficits, DOE secondary to debility from CHF exacerbation, with recent history of right shoulder and left femur fracture.  -patient may shower  -ELOS/Goals: 10-14 days/supervision/min A.  Admit to CIR 2.  Antithrombotics: -DVT/anticoagulation:  Pharmaceutical: Coumadin  -antiplatelet therapy: N/A 3. Chronic back pain/Fibromyalgia/Pain Management: Per Dr. Royanne Foots hydrocodone prn at home   Monitor with increased exertion 4. Mood: LCSW to follow for evaluation and support.   -antipsychotic agents: N/A 5. Neuropsych: This patient is capable of making decisions on her own behalf. 6. Skin/Wound Care: Routine pressure relief measures. Will add nystatin for candida skin folds.  7. Fluids/Electrolytes/Nutrition: Monitor I/O. Strict I/Os.  CMP ordered. 8. Acute on chronic CHF: On Bystolic and Lasix --intolerant of statins.  Daily weights with strict I/O and HH diet. Now on lasix once daily starting starting tomorrow. She continues to drop to mid 80's with activity.  Will repeat two-view chest x-ray for follow up on increase in infiltrate.    9.  OSA: Has been refusing CPAP--discussed importance of use to help with respiratory/cardiac status. Husband to bring in her mask.  10. GERD: Symptoms worse since surgery but have been better since hospitalization. Will continue Pepcid (used Tagamet PTA). Resume probiotic. 11. HTN: Monitor BP tid --continue Norvasc and Lasix.   Mild with increased mobility. 12. Chronic hypomagnesemia: Does not like to use supplements due to SE of diarrhea. Mg-trending back down. Will add low dose Mag ox and monitor.   Mag level ordered. 13. ABLA: H/H still low. Will add iron supplement.   CBC ordered. 14. Right humerus fx: NWB RUE with sling. Pendulum exercises and AAROM with PT  15. Morbid obesity: Encouraged weight loss.  Bary Leriche, PA-C 08/18/2019  I have personally performed a face to face diagnostic evaluation, including, but not limited to relevant history and physical exam findings, of this patient and developed relevant assessment and plan.  Additionally, I have reviewed and concur with the physician assistant's documentation above.  Delice Lesch, MD, ABPMR

## 2019-08-18 NOTE — Progress Notes (Signed)
PMR Admission Coordinator Pre-Admission Assessment   Patient: [name] is an 85 y.o., female MRN: [id number] DOB: [birth year] Height: 5' 2"  (157.5 cm) Weight: 84.8 kg (scale a)   Insurance Information HMO:     PPO:      PCP:      IPA:      80/20:      OTHER:  PRIMARY: medicare a and b      Policy#: [id number]      Subscriber: pt CM Name:       Phone#:      Fax#:  Pre-Cert#: verified Civil engineer, contracting:  Benefits:  Phone #:      Name:  Eff. Date: A and B 07/12/2001     Deduct: $1484      Out of Pocket Max: n/a      Life Max: n/a CIR: 100%      SNF: 20 full days Outpatient: 80%     Co-Pay: 20% Home Health: 100%      Co-Pay:  DME: 80%     Co-Pay: 20% Providers: pt choice  SECONDARY: A[employer]      Policy#: [id number]     Phone#:    Financial Counselor:       Phone#:    The "Data Collection Information Summary" for patients in Inpatient Rehabilitation Facilities with attached "Privacy Act [place] Records" was provided and verbally reviewed with: Patient and Family   Emergency Contact Information         Contact Information     Name Relation Home Work Mobile    [place] R Spouse [place] Son [phone]   [phone]         Current Medical History  Patient Admitting Diagnosis: functional decline 2/2 humeral fracture and CHF exacerbation   History of Present Illness: Pt is a 85 y/o female with PMH of GERD, PUD, HTN, HLD, carotid stenosis, afib, CHF, OSA (on cpap), and PE who presented to [facility] hospital on 08/15/19 with increasing SOB and weight gain.  Workup revealed CHF exacerbation and pt was requiring O2 up to 5L on admit with NRB use for limited mobility.  Pt reports intermittent O2 use at home.  Chest xray on 7/6 and 7/7 showed improved atelectasis and persistent pulmonary edema.  Pt admitted to [facility] for short term rehab 6/11 through 7/3 following a fall with a R complex humeral fracture, s/p reverse TSA.  Therapy  evaluations were completed and pt was recommended for CIR to maximize independence.    Patient's medical record from [facility] has been reviewed by the rehabilitation admission coordinator and physician.   Past Medical History      Past Medical History:  Diagnosis Date  . Atrial fibrillation ([place])    . CAP (community acquired pneumonia) 12/27/2018  . Carotid artery occlusion    . Chronic diastolic CHF (congestive heart failure) (HCC)      Hypertensive heart disease 02-12-14  . Crohn's disease ([place])    . Detached retina    . Fibromyalgia    . GERD (gastroesophageal reflux disease)    . Gout    . H/O hiatal hernia    . High triglycerides    . History of stomach ulcers 1950's  . Hypertension    . Long term current use of anticoagulant    . Obstructive sleep apnea on CPAP    . Osteoarthritis      PAIN AND OA  LEF T HIP AND BOTH SHOULDERS ARE BONE ON BONE AND PAINFUL  . Peripheral vascular disease (HCC)      KNOWN RIGHT INTERNAL CAROTID ARTERY OCCLUSION (NO STROKE)  --40 TO 59% STENOSIS LEFT ICA-FOLLOWED BY DR. EARLY WITH DOPPLER STUDY EVERY 6 MONTHS  . Pulmonary embolism ([place]) 2011    a. Hx of PE in 02/2009 after R hip surgery, venous dopplers negative, long-term Coumadin.  [name] Kitchen PVC (premature ventricular contraction)      PT STATES HX OF PVC'S ON EKG  . Recurrent upper respiratory infection (URI)      BRONCHITIS FEB 2013--SLIGHT COUGH NON-PRODUCTIVE NOW  . Recurrent UTI (urinary tract infection)      "on daily medicine" (05/14/2012)  . Tinnitus    . Vertigo        Family History   family history includes AAA (abdominal aortic aneurysm) in her mother; Aneurysm in her mother; Diabetes in her paternal grandfather and son; Heart attack in her brother; Heart attack (age of onset: 88) in her father; Heart disease in her brother, father, and mother; Hyperlipidemia in her brother; Hypertension in her brother, brother, father, and mother; Peripheral vascular disease in her father; Stroke in her  mother.   Prior Rehab/Hospitalizations Has the patient had prior rehab or hospitalizations prior to admission? Yes   Has the patient had major surgery during 100 days prior to admission? Yes              Current Medications   Current Facility-Administered Medications:  .  0.9 %  sodium chloride infusion, , Intravenous, PRN, Elodia Florence., MD, Stopped at 08/16/19 1430 .  allopurinol (ZYLOPRIM) tablet 300 mg, 300 mg, Oral, Daily, Fair, Marin Shutter, MD, 300 mg at 08/18/19 7062 .  amLODipine (NORVASC) tablet 5 mg, 5 mg, Oral, Daily, Fair, Marin Shutter, MD, 5 mg at 08/18/19 3762 .  benzonatate (TESSALON) capsule 100 mg, 100 mg, Oral, TID PRN, Fair, Chelsea N, MD .  brimonidine (ALPHAGAN) 0.2 % ophthalmic solution 1 drop, 1 drop, Right Eye, BID, Fair, Marin Shutter, MD, 1 drop at 08/18/19 0843 .  cholecalciferol (VITAMIN D3) tablet 1,000 Units, 1,000 Units, Oral, Daily, Fair, Marin Shutter, MD, 1,000 Units at 08/18/19 0840 .  dorzolamide-timolol (COSOPT) 22.3-6.8 MG/ML ophthalmic solution 1 drop, 1 drop, Both Eyes, BID, Fair, Marin Shutter, MD, 1 drop at 08/18/19 0843 .  famotidine (PEPCID) tablet 10 mg, 10 mg, Oral, Daily, Fair, Chelsea N, MD, 10 mg at 08/18/19 0841 .  furosemide (LASIX) tablet 40 mg, 40 mg, Oral, Daily, Elodia Florence., MD, 40 mg at 08/18/19 0840 .  HYDROcodone-acetaminophen (NORCO/VICODIN) 5-325 MG per tablet 0.5-1 tablet, 0.5-1 tablet, Oral, Q6H PRN, Fair, Marin Shutter, MD, 1 tablet at 08/18/19 1215 .  ipratropium (ATROVENT) 0.06 % nasal spray 1 spray, 1 spray, Each Nare, Daily PRN, Fair, Chelsea N, MD .  latanoprost (XALATAN) 0.005 % ophthalmic solution 1 drop, 1 drop, Both Eyes, QHS, Elodia Florence., MD, 1 drop at 08/17/19 2028 .  loratadine (CLARITIN) tablet 10 mg, 10 mg, Oral, Daily PRN, Fair, Chelsea N, MD .  mirtazapine (REMERON) tablet 7.5 mg, 7.5 mg, Oral, QHS, Fair, Chelsea N, MD, 7.5 mg at 08/15/19 2236 .  nebivolol (BYSTOLIC) tablet 10 mg, 10 mg, Oral, QHS, Fair,  Chelsea N, MD, 10 mg at 08/17/19 2224 .  Netarsudil dimeslyate (Rhopressa) 0.02% solution , 1 drop, Right Eye, QHS, Fair, Marin Shutter, MD, 1 drop at 08/17/19 2028 .  pantoprazole (PROTONIX) EC tablet  40 mg, 40 mg, Oral, Daily, Elodia Florence., MD, 40 mg at 08/18/19 0841 .  triamcinolone (NASACORT) nasal inhaler 2 spray, 2 spray, Nasal, Daily PRN, Fair, Chelsea N, MD .  warfarin (COUMADIN) tablet 3 mg, 3 mg, Oral, ONCE-1600, Donnamae Jude, RPH .  Warfarin - Pharmacist Dosing Inpatient, , Does not apply, q1600, Elodia Florence., MD, Given at 08/17/19 1600   Patients Current Diet:     Diet Order                      Diet - low sodium heart healthy              Diet Heart Room service appropriate? Yes; Fluid consistency: Thin  Diet effective now                      Precautions / Restrictions Precautions Precautions: Fall, Shoulder Type of Shoulder Precautions: per last admission, okay for AROM E/W/H, OK for pendulums, Forward flexion to 90, no abduction, ER to neutral okay, no AROM shoulder Precaution Comments: per last admission "Ok to [facility] for cane or walker use" Restrictions Weight Bearing Restrictions: Yes RUE Weight Bearing: Non weight bearing Other Position/Activity Restrictions: assume NWB in RUE    Has the patient had 2 or more falls or a fall with injury in the past year? Yes   Prior Activity Level Limited Community (1-2x/wk): prior to fall, was able to go out a few times a week, using a SPC for balance   Prior Functional Level Self Care: Did the patient need help bathing, dressing, using the toilet or eating? Independent   Indoor Mobility: Did the patient need assistance with walking from room to room (with or without device)? Independent   Stairs: Did the patient need assistance with internal or external stairs (with or without device)? Independent   Functional Cognition: Did the patient need help planning regular tasks such as shopping or remembering to  take medications? [place] / [place] Devices/Equipment: Environmental consultant (specify type) Home Equipment: Walker - 2 wheels, Walker - 4 wheels, Grab bars - tub/shower, Grab bars - toilet, [employer] - single point, Shower seat   Prior Device Use: Indicate devices/aids used by the patient prior to current illness, exacerbation or injury? cane   Current Functional Level Cognition   Overall Cognitive Status: Impaired/Different from baseline Current Attention Level: Selective General Comments: WFL for majority of simple tasks, some difficulty multi-tasking noted    Extremity Assessment (includes Sensation/Coordination)   Upper Extremity Assessment: RUE deficits/detail RUE Deficits / Details: pt with recent fall resulting in R shoulder sx RUE: Unable to fully assess due to immobilization RUE Coordination: decreased fine motor, decreased gross motor  Lower Extremity Assessment: Defer to PT evaluation     ADLs   Overall ADL's : Needs assistance/impaired Eating/Feeding: Set up, Sitting Grooming: Minimal assistance, Sitting Upper Body Bathing: Moderate assistance, Sitting Lower Body Bathing: Maximal assistance, Sitting/lateral leans, Sit to/from stand, +2 for safety/equipment, +2 for physical assistance Upper Body Dressing : Total assistance, Sitting Upper Body Dressing Details (indicate cue type and reason): assist for sling management  Lower Body Dressing: Maximal assistance, Sitting/lateral leans, Sit to/from stand, +2 for safety/equipment, +2 for physical assistance Lower Body Dressing Details (indicate cue type and reason): minA for standing balance  Toilet Transfer: Minimal assistance, Stand-pivot, BSC Toilet Transfer Details (indicate cue type and reason): MIN A for balance to pivot to [facility]  with HHA Toileting- Clothing Manipulation and Hygiene: Total assistance, Sit to/from stand Toileting - Clothing Manipulation Details (indicate cue type and reason): total  A for posterior pericare post BM Functional mobility during ADLs: Minimal assistance (stand pivot only) General ADL Comments: pt limited by fatigue and decreased activity tolerance     Mobility   Overal bed mobility: Needs Assistance Bed Mobility: Supine to Sit Supine to sit: Mod assist, HOB elevated General bed mobility comments: ModA for LUE support to elevate trunk     Transfers   Overall transfer level: Needs assistance Equipment used: Straight cane, None Transfers: Sit to/from Stand Sit to Stand: Min assist, Min guard Stand pivot transfers: Min assist General transfer comment: Initial minA to elevate trunk and maintain balance upon standing, progressing to min guard on subsequent trials from recliner, improving technique with SPC     Ambulation / Gait / Stairs / Wheelchair Mobility   Ambulation/Gait Ambulation/Gait assistance: Min guard, Min assist, Mod assist, +2 safety/equipment Gait Distance (Feet): 20 Feet (+ 42' + 50') Assistive device: Straight cane Gait Pattern/deviations: Step-through pattern, Decreased stride length, Trunk flexed, Staggering right, Staggering left General Gait Details: Slow, unsteady gait with SPC and frequent min-modA to prevent LOB; +2 assist for safety/chair follow. 3x seated rest break secondary to fatigue and DOE. SpO2 down to 86% on 2L O2 [place], returning to 89-94% with seated rest and deep breathing Gait velocity: Decreased Gait velocity interpretation: <1.31 ft/sec, indicative of household ambulator     Posture / Balance Dynamic Sitting Balance Sitting balance - Comments: Unable to reach bilateral feet; dependent for LB ADL Balance Overall balance assessment: Needs assistance Sitting-balance support: Feet supported, No upper extremity supported Sitting balance-Leahy Scale: Fair Sitting balance - Comments: Unable to reach bilateral feet; dependent for LB ADL Standing balance support: Single extremity supported Standing balance-Leahy Scale:  Poor Standing balance comment: Reliant on UE support for static standing; dependent for posterior pericare     Special needs/care consideration CPAP, Oxygen 2L, Skin hematoma to LUE, R shoulder incision and Designated visitor Timmothy Sours and [place] (from acute therapy documentation) Living Arrangements: Spouse/significant other Available Help at Discharge: Family, Available 24 hours/day Type of Home: House Home Layout: One level Home Access: Stairs to enter Entrance Stairs-Rails: Left Entrance Stairs-Number of Steps: 5 Bathroom Shower/Tub: Chiropodist: [place]: No   Discharge Living Setting Plans for Discharge Living Setting: Patient's home Type of Home at Discharge: House Discharge Home Layout: One level Discharge Home Access: Stairs to enter Entrance Stairs-Rails: Left Entrance Stairs-Number of Steps: 5 Discharge Bathroom Shower/Tub: Tub/shower unit Discharge Bathroom Toilet: Standard Discharge Bathroom Accessibility: Yes How Accessible: Accessible via walker Does the patient have any problems obtaining your medications?: No   Social/Family/Support Systems Patient Roles: Spouse Anticipated Caregiver: Darenda Fike (spouse) Anticipated Caregiver's Contact Information: [phone] Ability/Limitations of Caregiver: supervision Caregiver Availability: 24/7 Discharge Plan Discussed with Primary Caregiver: Yes Is Caregiver In Agreement with Plan?: Yes Does Caregiver/Family have Issues with Lodging/Transportation while Pt is in Rehab?: No   Goals Patient/Family Goal for Rehab: PT/OT mod I, SLP n/a Expected length of stay: 6-9 days Additional Information: pt with recent admit to [facility] following fall with complex humeral fx s/p reverse TSA (R) Pt/Family Agrees to Admission and willing to participate: Yes Program Orientation Provided & Reviewed with Pt/Caregiver Including Roles  & Responsibilities: Yes   Decrease  burden of Care through IP rehab admission: n/a   Possible need for SNF  placement upon discharge: No   Patient Condition: I have reviewed medical records from [facility], spoken with CM, and patient and spouse. I met with patient at the bedside for inpatient rehabilitation assessment.  Patient will benefit from ongoing PT and OT, can actively participate in 3 hours of therapy a day 5 days of the week, and can make measurable gains during the admission.  Patient will also benefit from the coordinated team approach during an Inpatient Acute Rehabilitation admission.  The patient will receive intensive therapy as well as Rehabilitation physician, nursing, social worker, and care management interventions.  Due to safety, skin/wound care, disease management, medication administration, pain management and patient education the patient requires 24 hour a day rehabilitation nursing.  The patient is currently min assist with mobility and basic ADLs.  Discharge setting and therapy post discharge at home is anticipated.  Patient has agreed to participate in the Acute Inpatient Rehabilitation Program and will admit today.   Preadmission Screen Completed By:  Michel Santee, PT, DPT 08/18/2019 2:27 PM ______________________________________________________________________   Discussed status with Dr. Posey Pronto on 08/18/19  at 2:36 PM  and received approval for admission today.   Admission Coordinator:  Michel Santee, PT, DPT time 2:36 PM Sudie Grumbling 08/18/19     Assessment/Plan: Diagnosis: Debility 1. Does the need for close, 24 hr/day Medical supervision in concert with the patient's rehab needs make it unreasonable for this patient to be served in a less intensive setting? Yes  2. Co-Morbidities requiring supervision/potential complications: GERD, PUD, HTN, HLD, carotid stenosis, afib, CHF, OSA (on cpap), and PE 3. Due to bowel management, safety, skin/wound care, disease management, pain management and patient  education, does the patient require 24 hr/day rehab nursing? Yes 4. Does the patient require coordinated care of a physician, rehab nurse, PT, OT, and SLP to address physical and functional deficits in the context of the above medical diagnosis(es)? Yes Addressing deficits in the following areas: balance, endurance, locomotion, strength, transferring, bathing, dressing, toileting and psychosocial support 5. Can the patient actively participate in an intensive therapy program of at least 3 hrs of therapy 5 days a week? Yes 6. The potential for patient to make measurable gains while on inpatient rehab is excellent 7. Anticipated functional outcomes upon discharge from inpatient rehab: supervision and min assist PT, supervision and min assist OT, n/a SLP 8. Estimated rehab length of stay to reach the above functional goals is: 7-10 days. 9. Anticipated discharge destination: Home 10. Overall Rehab/Functional Prognosis: excellent     MD Signature: Delice Lesch, MD, ABPMR

## 2019-08-18 NOTE — Discharge Summary (Addendum)
Physician Discharge Summary  [name] SPQ:[id number] DOB: 03-May-1934 DOA: 08/15/2019  PCP: Deland Pretty, MD  Admit date: 08/15/2019 Discharge date: 08/18/2019  Admitted From: home Disposition:  CIR  Recommendations for Outpatient Follow-up:  1. Follow up with PCP in 1-2 weeks 2. Please obtain BMP in one week 3. Continue to follow CBC every other day 4. Warm compresses to left hand hematoma 5. NWB on right arm 6. Repeat chest xray in 1 week. 7. Follow up with Dr. Stann Mainland as previously scheduled for right humerus fracture 8. Consider outpatient GI referral for dysphagia 9. Continue CPAP QHS  Discharge Condition:stable CODE STATUS:full code Diet recommendation: heart healthy  Brief/Interim Summary: [name] Desantois a 85 y.o.femalewith PMH ofGerd, PUD, hypertension, hyperlipidemia, Carotid stenosis, Pafib, CHF (diastolic), OSA on Cpap, h/o PEpresents with SOB and admitted for CHF exacerbation.  Notably, she has oxygen at home that she reportedly uses sparingly.  Discharge Diagnoses:  Principal Problem:   Acute exacerbation of CHF (congestive heart failure) (HCC) Active Problems:   Gout   OA (osteoarthritis) of hip   Normocytic anemia   Chronic diastolic CHF (congestive heart failure) (HCC)   Morbid obesity (HCC)   OSA on CPAP   Essential hypertension   Chronic atrial fibrillation (HCC)   Acute on chronic diastolic (congestive) heart failure (HCC)   Gastroesophageal reflux disease without esophagitis  Acute respiratory failure with hypoxia in the setting of acute on chronic diastolic CHF, POA -SOB J3-3 days with weight gain, increased LE edema and progressive dyspnea -requiring 5L O2 on admission and non-rebreather for moving her in bed -this has been weaned down to 2L oxygen -she intermittently uses oxygen at home at baseline -Last Echo: June 2020: Normal EF, diastolic HF - repeat echo with EF 65-70%, indeterminate diastolic parameters (see report) - She was treated  with lasix 40 IV BID  - she notes her dry weight at home is ~200 lbs (she's was 204.8 on admission, 186.6 today) -she has been transitioned to oral lasix - CXR 7/6 improved atelectasis and effusions, persistent pulmonary edema - repeat CXR 7/7 shows some degree of persistent pulmonary edema, but clinically she is feeling improved - I/O, daily weights - continue telemetry  - Continue bystolic  - elevated BNP, troponin wnl - therapy  Normocytic Anemia -Hgb 8.7 on admission; baseline -normal iron, ferritin.  Follow B12, folate (both wnl) - Hb 8.4 today  Permanent atrial fibrillation  -Cont Warfarin; INR at goal on admission  -Daily INR (2.9) - dosing per pharmacy -Tele  Dysphagia: follow SLP recs, s/p MBS - SLP recommended follow up with gastroenterologist, oropharyngeal swallow within functional limits, esophageal screening with retention of boluses in upper thoracic esophagus with delayed transport to stomach.  Recommended GERD and swallowing precautions, GI follow up.  Hypertension -continue amlodipine, Bystolic  Gout -Continue allopurinol  GERD -Continuehome equivalent  OSA -Continue CPAP QHS  Hypomagnesemia  Hypokalemia: replace and follow   Displaced and comminuted proximal humerus fracture s/p reverse shoulder arthroplasty - she was NWB to RUE at time of discharge (6/11) - she's followed up with Dr. Stann Mainland since then - discussed with one of Dr. Stann Mainland partners to pass message along that she was here - he recommended continued NWB - seen by physical therapy with recommendations for CIR  Left hand hematoma -likely related to IV sticks in the setting of anticoagulation -pulses and sensation intact -no pain in left hand -continue to monitor -continue warm compressess  Discharge Instructions  Discharge Instructions  Diet - low sodium heart healthy   Complete by: As directed    Increase activity slowly   Complete by: As directed      Allergies as of  08/18/2019      Reactions   Codeine    NAUSEA   Cymbalta [duloxetine Hcl]    Nausea VOMITING AND ABDOMINAL PAIN, HEADACHE, JUST ABOUT EVERY SIDE EFFECT THE DRUG HAS   Diltiazem Cd [diltiazem Hcl Er Beads]    Heavy legs, cough   Duloxetine    Other reaction(s): Other (See Comments) Nausea VOMITING AND ABDOMINAL PAIN, HEADACHE, JUST ABOUT EVERY SIDE EFFECT THE DRUG HAS   Metoprolol    Legs like cement   Mirtazapine Other (See Comments)   nightmares   Nortriptyline    Nightmares   Penicillins    RASH  & ITCHING   --PT STATES SHE CAN TAKE KEFLEX PO AND IV CEPHALOSPORINS   Tolerate IV ancef on 07/20/2019   Statins    Leg cramps   Tetanus Toxoids    SWELLING, REDNESS  WHOLE ARM   Ciprofloxacin Rash   RASH   Lyrica [pregabalin] Diarrhea, Nausea And Vomiting, Rash      Medication List    STOP taking these medications   mirtazapine 7.5 MG tablet Commonly known as: REMERON   mupirocin ointment 2 % Commonly known as: BACTROBAN   silver sulfADIAZINE 1 % cream Commonly known as: Silvadene     TAKE these medications   allopurinol 300 MG tablet Commonly known as: ZYLOPRIM Take 300 mg by mouth daily.   amLODipine 5 MG tablet Commonly known as: NORVASC Take 5 mg by mouth daily.   benzonatate 100 MG capsule Commonly known as: TESSALON Take 100 mg by mouth 3 (three) times daily as needed for cough.   brimonidine 0.2 % ophthalmic solution Commonly known as: ALPHAGAN Place 1 drop into the right eye 2 (two) times daily.   cholecalciferol 25 MCG (1000 UNIT) tablet Commonly known as: VITAMIN D3 Take 1,000 Units by mouth daily.   cimetidine 200 MG tablet Commonly known as: TAGAMET Take 200 mg by mouth 2 (two) times daily.   CRANBERRY CONCENTRATE PO Take 1 tablet by mouth daily.   dorzolamide-timolol 22.3-6.8 MG/ML ophthalmic solution Commonly known as: COSOPT Place 1 drop into both eyes 2 (two) times daily.   furosemide 40 MG tablet Commonly known as: LASIX Take 1  tablet (40 mg total) by mouth daily. Start taking on: August 19, 2019 What changed:   medication strength  how much to take  how to take this  when to take this  additional instructions   HYDROcodone-acetaminophen 5-325 MG tablet Commonly known as: NORCO/VICODIN Take 1 tablet by mouth every 4 (four) hours as needed for moderate pain.   ipratropium 0.06 % nasal spray Commonly known as: ATROVENT Place 1 spray into both nostrils daily as needed for rhinitis.   ketoconazole 2 % cream Commonly known as: NIZORAL Apply 1 application topically See admin instructions. Use 1 to 2 times daily   loratadine 10 MG tablet Commonly known as: CLARITIN Take 10 mg by mouth daily as needed for allergies.   Lumigan 0.01 % Soln Generic drug: bimatoprost Place 1 drop into both eyes at bedtime.   multivitamin with minerals Tabs tablet Take 1 tablet by mouth daily.   Nasacort Allergy 24HR 55 MCG/ACT Aero nasal inhaler Generic drug: triamcinolone Place 2 sprays into the nose daily as needed (allergies).   nebivolol 10 MG tablet Commonly known as: BYSTOLIC Take  1 tablet (10 mg total) by mouth at bedtime.   pantoprazole 40 MG tablet Commonly known as: PROTONIX Take 1 tablet (40 mg total) by mouth daily. Start taking on: August 19, 2019   potassium chloride 10 MEQ tablet Commonly known as: KLOR-CON Take 2 tablets (20 mEq total) by mouth daily.   Rhopressa 0.02 % Soln Generic drug: Netarsudil Dimesylate Place 1 drop into the right eye at bedtime.   warfarin 2.5 MG tablet Commonly known as: COUMADIN Take as directed. If you are unsure how to take this medication, talk to your nurse or doctor. Original instructions: Take 1 tablet (2.5 mg total) by mouth See admin instructions. Take 3.75 mg on Mondays & Fridays. Take 2.5 mg on all other days What changed: See the new instructions.       Allergies  Allergen Reactions  . Codeine     NAUSEA  . Cymbalta [Duloxetine Hcl]     Nausea  VOMITING AND ABDOMINAL PAIN, HEADACHE, JUST ABOUT EVERY SIDE EFFECT THE DRUG HAS  . Diltiazem Cd [Diltiazem Hcl Er Beads]     Heavy legs, cough  . Duloxetine     Other reaction(s): Other (See Comments) Nausea VOMITING AND ABDOMINAL PAIN, HEADACHE, JUST ABOUT EVERY SIDE EFFECT THE DRUG HAS  . Metoprolol     Legs like cement  . Mirtazapine Other (See Comments)    nightmares  . Nortriptyline     Nightmares  . Penicillins     RASH  & ITCHING   --PT STATES SHE CAN TAKE KEFLEX PO AND IV CEPHALOSPORINS    Tolerate IV ancef on 07/20/2019  . Statins     Leg cramps  . Tetanus Toxoids     SWELLING, REDNESS  WHOLE ARM  . Ciprofloxacin Rash    RASH  . Lyrica [Pregabalin] Diarrhea, Nausea And Vomiting and Rash    Consultations:     Procedures/Studies: DG Chest 2 View  Result Date: 08/15/2019 CLINICAL DATA:  Awoke from sleep with shortness of breath. EXAM: CHEST - 2 VIEW COMPARISON:  Radiograph 12/27/2018.  CT 07/13/2018 FINDINGS: Cardiomegaly. Diffuse interstitial and peribronchial thickening suspicious for pulmonary edema. Small bilateral pleural effusions and hazy lung base opacities. No pneumothorax. Bilateral shoulder arthroplasties. IMPRESSION: Cardiomegaly with pulmonary edema and small bilateral pleural effusions. Findings most consistent with CHF. Electronically Signed   By: Keith Rake M.D.   On: 08/15/2019 02:38   DG Shoulder Right  Result Date: 07/19/2019 CLINICAL DATA:  [place], right shoulder pain EXAM: RIGHT SHOULDER - 2+ VIEW; RIGHT HUMERUS - 2+ VIEW COMPARISON:  None. FINDINGS: Right shoulder: Internal rotation, external rotation, and transscapular views of the right shoulder are obtained. Evaluation limited by body habitus. There is a comminuted humeral neck fracture with impaction. Severe glenohumeral osteoarthritis. No dislocation. Right humerus: Frontal and lateral views are obtained, limited by body habitus. There is a comminuted fracture of the right humeral neck, with  ventral angulation at the fracture site. Osteoarthritis of the glenohumeral joint. No dislocation. The right elbow is unremarkable. IMPRESSION: 1. Comminuted impacted right humeral neck fracture. 2. Severe glenohumeral osteoarthritis.  No dislocation. Electronically Signed   By: Randa Ngo M.D.   On: 07/19/2019 18:08   CT Head Wo Contrast  Result Date: 07/19/2019 CLINICAL DATA:  [place] EXAM: CT HEAD WITHOUT CONTRAST TECHNIQUE: Contiguous axial images were obtained from the base of the skull through the vertex without intravenous contrast. COMPARISON:  11/09/2004 FINDINGS: Brain: There is diffuse hypodensity throughout the right temporal white matter, new since prior  study. There is gray-white preservation throughout the majority of the right temporal lobe, with no significant cortical atrophy. Appearance is atypical for ischemic change, and could reflect an element of vasogenic edema. MRI recommended for further evaluation. No other signs of acute infarct or hemorrhage. Lateral ventricles and midline structures are otherwise unremarkable. Chronic small vessel ischemic changes are seen within the bilateral basal ganglia. There are no acute extra-axial fluid collections. Vascular: No hyperdense vessel or unexpected calcification. Skull: Normal. Negative for fracture or focal lesion. Sinuses/Orbits: No acute finding. Other: None. IMPRESSION: 1. Diffuse hypodensity throughout the right temporal lobe, with gray-white preservation of the majority of the right temporal lobe. This is atypical for ischemic change, and could reflect an element of vasogenic edema. MRI recommended for further evaluation. 2. No evidence of hemorrhage. Electronically Signed   By: Randa Ngo M.D.   On: 07/19/2019 19:11   CT Cervical Spine Wo Contrast  Result Date: 07/19/2019 CLINICAL DATA:  Fall.  On warfarin. EXAM: CT CERVICAL SPINE WITHOUT CONTRAST TECHNIQUE: Multidetector CT imaging of the cervical spine was performed without  intravenous contrast. Multiplanar CT image reconstructions were also generated. COMPARISON:  None. FINDINGS: Alignment: No traumatic malalignment. Mild reversal of the normal cervical lordosis. Trace anterolisthesis at C3-C4 and C4-C5. Skull base and vertebrae: No acute fracture. No primary bone lesion or focal pathologic process. Soft tissues and spinal canal: No prevertebral fluid or swelling. No visible canal hematoma. Disc levels: Moderate to severe disc height loss and moderate uncovertebral hypertrophy at C5-C6. Diffuse moderate left and mild right facet arthropathy throughout the cervical spine. Upper chest: Negative. Other: None. IMPRESSION: 1. No acute cervical spine fracture or traumatic malalignment. 2. Moderate to severe degenerative changes at C5-C6. Electronically Signed   By: Titus Dubin M.D.   On: 07/19/2019 19:57   CT SHOULDER RIGHT WO CONTRAST  Result Date: 07/19/2019 CLINICAL DATA:  Proximal humerus fracture after fall. EXAM: CT OF THE UPPER RIGHT EXTREMITY WITHOUT CONTRAST TECHNIQUE: Multidetector CT imaging of the upper right extremity was performed according to the standard protocol. COMPARISON:  Right shoulder and humerus x-rays from same day. FINDINGS: Bones/Joint/Cartilage Acute fracture of the proximal humerus surgical neck with 7 mm medial and anterior displacement. The fracture extends to the base of the greater tuberosity. No involvement of the lesser tuberosity. No dislocation. Severe glenohumeral joint space narrowing with prominent subchondral cystic change. Small glenohumeral joint effusion with multiple small intra-articular bodies. Mild acromioclavicular joint osteoarthritis. Ligaments Ligaments are suboptimally evaluated by CT. Muscles and Tendons Grossly intact.  No significant muscle atrophy. Soft tissue No fluid collection or hematoma. No soft tissue mass. The visualized right lung is clear. IMPRESSION: 1. Acute mildly displaced fracture of the proximal humerus surgical  neck as described above. 2. Severe glenohumeral osteoarthritis with multiple small intra-articular bodies. Electronically Signed   By: Titus Dubin M.D.   On: 07/19/2019 20:02   DG CHEST PORT 1 VIEW  Result Date: 08/18/2019 CLINICAL DATA:  Hypoxia EXAM: PORTABLE CHEST 1 VIEW COMPARISON:  Portable exam 0554 hours compared to 08/16/2019 FINDINGS: Enlargement of cardiac silhouette. Atherosclerotic calcification aorta. Mediastinal contours normal. BILATERAL pulmonary infiltrates which could represent pulmonary edema or atypical infection. More focal opacities are seen at the lung bases question confluent infiltrate versus atelectasis. Minimal LEFT pleural effusion. No pneumothorax. Bones demineralized with BILATERAL shoulder prostheses noted. IMPRESSION: Enlargement of cardiac silhouette with favor pulmonary edema though atypical infection could have a similar pattern. Increased infiltrate versus atelectasis at lung bases with swelling of pleural  effusion. Electronically Signed   By: Lavonia Dana M.D.   On: 08/18/2019 08:34   DG CHEST PORT 1 VIEW  Result Date: 08/16/2019 CLINICAL DATA:  Hypoxia. Patient status post right shoulder replacement 07/20/2019. EXAM: PORTABLE CHEST 1 VIEW COMPARISON:  Single-view of the chest 08/15/2019. PA and lateral chest 09/08/2018 FINDINGS: Marked cardiomegaly and interstitial edema persist. Bibasilar atelectasis and small effusions appear improved. Atherosclerosis noted. No pneumothorax. IMPRESSION: Improved bibasilar atelectasis and small effusions. No notable change in cardiomegaly and pulmonary edema. Electronically Signed   By: Inge Rise M.D.   On: 08/16/2019 12:16   DG Shoulder Right Port  Result Date: 07/20/2019 CLINICAL DATA:  Status post reverse total shoulder arthroplasty, right. EXAM: PORTABLE RIGHT SHOULDER COMPARISON:  Preoperative radiograph yesterday. FINDINGS: Reverse right shoulder arthroplasty in expected alignment. No periprosthetic lucency or fracture.  Acromioclavicular joint is congruent. Expected postsurgical change in the soft tissues. IMPRESSION: Reverse right shoulder arthroplasty without immediate postoperative complication. Electronically Signed   By: Keith Rake M.D.   On: 07/20/2019 18:43   DG Humerus Right  Result Date: 07/19/2019 CLINICAL DATA:  [place], right shoulder pain EXAM: RIGHT SHOULDER - 2+ VIEW; RIGHT HUMERUS - 2+ VIEW COMPARISON:  None. FINDINGS: Right shoulder: Internal rotation, external rotation, and transscapular views of the right shoulder are obtained. Evaluation limited by body habitus. There is a comminuted humeral neck fracture with impaction. Severe glenohumeral osteoarthritis. No dislocation. Right humerus: Frontal and lateral views are obtained, limited by body habitus. There is a comminuted fracture of the right humeral neck, with ventral angulation at the fracture site. Osteoarthritis of the glenohumeral joint. No dislocation. The right elbow is unremarkable. IMPRESSION: 1. Comminuted impacted right humeral neck fracture. 2. Severe glenohumeral osteoarthritis.  No dislocation. Electronically Signed   By: Randa Ngo M.D.   On: 07/19/2019 18:08   DG Swallowing Func-Speech Pathology  Result Date: 08/16/2019 Objective Swallowing Evaluation: Type of Study: Bedside Swallow Evaluation  Patient Details Name: [name] MRN: [id number] Date of Birth: 06-24-1934 Today's Date: 08/16/2019 Time: SLP Start Time (ACUTE ONLY): 1030 -SLP Stop Time (ACUTE ONLY): 1047 SLP Time Calculation (min) (ACUTE ONLY): 17 min Past Medical History: Past Medical History: Diagnosis Date . Atrial fibrillation ([place])  . CAP (community acquired pneumonia) 12/27/2018 . Carotid artery occlusion  . Chronic diastolic CHF (congestive heart failure) (HCC)   Hypertensive heart disease 02-12-14 . Crohn's disease ([place])  . Detached retina  . Fibromyalgia  . GERD (gastroesophageal reflux disease)  . Gout  . H/O hiatal hernia  . High triglycerides  . History of  stomach ulcers 1950's . Hypertension  . Long term current use of anticoagulant  . Obstructive sleep apnea on CPAP  . Osteoarthritis   PAIN AND OA LEF T HIP AND BOTH SHOULDERS ARE BONE ON BONE AND PAINFUL . Peripheral vascular disease (HCC)   KNOWN RIGHT INTERNAL CAROTID ARTERY OCCLUSION (NO STROKE)  --40 TO 59% STENOSIS LEFT ICA-FOLLOWED BY DR. EARLY WITH DOPPLER STUDY EVERY 6 MONTHS . Pulmonary embolism ([place]) 2011  a. Hx of PE in 02/2009 after R hip surgery, venous dopplers negative, long-term Coumadin. [name] Kitchen PVC (premature ventricular contraction)   PT STATES HX OF PVC'S ON EKG . Recurrent upper respiratory infection (URI)   BRONCHITIS FEB 2013--SLIGHT COUGH NON-PRODUCTIVE NOW . Recurrent UTI (urinary tract infection)   "on daily medicine" (05/14/2012) . Tinnitus  . Vertigo  Past Surgical History: Past Surgical History: Procedure Laterality Date . APPENDECTOMY  1950's . BACK SURGERY  10/29/06  Central and  foraminal decompression L3-L4, L4-L5, and L5-S1 with inspection of L4-L5 and L5-S1 disc on the right . CARDIAC CATHETERIZATION  1970's  "maybe 2" (05/14/2012) . CARDIAC CATHETERIZATION   . CATARACT EXTRACTION W/ INTRAOCULAR LENS IMPLANT Right 2009 . CHOLECYSTECTOMY  ~ 1988 . DECOMPRESSIVE LUMBAR LAMINECTOMY LEVEL 3  ~ 2002 . DILATION AND CURETTAGE OF UTERUS    "several; from miscarriages" (05/14/2012) . EXCISION MORTON'S NEUROMA Right 05/20/00  "foot" (05/14/2012) . EYE SURGERY Left 2014  Detached retina . EYE SURGERY Left August 23, 2013  Cataract . FEMUR FRACTURE SURGERY Right 02/21/09  Open reduction internal fixation of right periprosthetic  femur fracture utilizing Zimmer cables times fiv . FRACTURE SURGERY  2011  Right Femur Fx . HAMMER TOE SURGERY Left 10/25/08  "toe next to big to" (05/14/2012) . JOINT REPLACEMENT  2009  Right Hip replacement . JOINT REPLACEMENT  2012  Right knee replacement . JOINT REPLACEMENT  06/17/11  Left Hip replacement . KNEE ARTHROSCOPY Right 07/26/05 . REPLACEMENT TOTAL KNEE Right 02/19/2010 . REVISION  TOTAL SHOULDER TO REVERSE TOTAL SHOULDER Right 07/20/2019  Procedure: REVERSE TOTAL SHOULDER REPLACEMENT;  Surgeon: Nicholes Stairs, MD;  Location: [facility];  Service: Orthopedics;  Laterality: Right; . SPINE SURGERY   . [place]? . TOTAL HIP ARTHROPLASTY Right 12/08/02  Osteonics total hip replacement . TOTAL HIP ARTHROPLASTY  06/17/2011  Procedure: TOTAL HIP ARTHROPLASTY;  Surgeon: Gearlean Alf, MD;  Location: [facility];  Service: Orthopedics;  Laterality: Left; . TOTAL SHOULDER ARTHROPLASTY Left 05/14/2012 . TOTAL SHOULDER ARTHROPLASTY Left 05/14/2012  Procedure: TOTAL SHOULDER ARTHROPLASTY;  Surgeon: Marin Shutter, MD;  Location: [facility];  Service: Orthopedics;  Laterality: Left; [name] Kitchen VAGINAL HYSTERECTOMY  1971 HPI: Pt is an 85 y.o. female with PMH of GERD, PUD, hypertension, hyperlipidemia, Carotid stenosis, Pafib, CHF (diastolic), OSA, h/o PE who presented with SOB and was admitted for CHF exacerbation. CXR 7/4: Cardiomegaly with pulmonary edema and small bilateral pleural effusions. Findings most consistent with CHF. WBC WNL. Pt is an 85 y.o. female with PMH of GERD, PUD, hypertension, hyperlipidemia, Carotid stenosis, Pafib, CHF (diastolic), OSA, h/o PE who presented with SOB and was admitted for CHF exacerbation. CXR 7/4: Cardiomegaly with pulmonary edema and small bilateral pleural effusions. Findings most consistent with CHF. WBC WNL.  No data recorded Assessment / Plan / Recommendation CHL IP CLINICAL IMPRESSIONS 08/16/2019 Clinical Impression Pt was seen in radiology suite for modified barium swallow study. Trials of puree solids, regular texture solids, a 34m barium tablet, and thin liquids via cup and straw were administered. Pt's oropharyngeal swallow mechanism was within functional limits. Esophageal screening revealed retention of boluses in the upper thoracic esophagus with delayed transport to the stomach. Backflow of this material to the pharynx was not observed during the  study. However, considering the pt's report of increased reflux and increased signs of aspiration, the possibility of backflow of boluses retained in the thoracic esophagus being aspirated is strongly considered. It is recommended that the current diet be continued with GERD and swallowing precautions. Pt was advised to follow up with her gastroenterologist regarding her symptoms and she verbalized agreement with this recommendation.  SLP Visit Diagnosis Dysphagia, unspecified (R13.10) Attention and concentration deficit following -- Frontal lobe and executive function deficit following -- Impact on safety and function Mild aspiration risk   CHL IP TREATMENT RECOMMENDATION 08/16/2019 Treatment Recommendations No treatment recommended at this time   No flowsheet data found. CHL IP DIET RECOMMENDATION 08/16/2019 SLP Diet Recommendations Regular  solids;Thin liquid Liquid Administration via Cup;Straw Medication Administration Whole meds with liquid Compensations Slow rate;Small sips/bites;Follow solids with liquid Postural Changes Remain semi-upright after after feeds/meals (Comment)   CHL IP OTHER RECOMMENDATIONS 08/16/2019 Recommended Consults -- Oral Care Recommendations Oral care BID Other Recommendations --   CHL IP FOLLOW UP RECOMMENDATIONS 08/16/2019 Follow up Recommendations None   CHL IP FREQUENCY AND DURATION 08/16/2019 Speech Therapy Frequency (ACUTE ONLY) min 2x/week Treatment Duration 1 week      CHL IP ORAL PHASE 08/16/2019 Oral Phase WFL Oral - Pudding Teaspoon -- Oral - Pudding Cup -- Oral - Honey Teaspoon -- Oral - Honey Cup -- Oral - Nectar Teaspoon -- Oral - Nectar Cup -- Oral - Nectar Straw -- Oral - Thin Teaspoon -- Oral - Thin Cup -- Oral - Thin Straw -- Oral - Puree -- Oral - Mech Soft -- Oral - Regular -- Oral - Multi-Consistency -- Oral - Pill -- Oral Phase - Comment --  CHL IP PHARYNGEAL PHASE 08/16/2019 Pharyngeal Phase WFL Pharyngeal- Pudding Teaspoon -- Pharyngeal -- Pharyngeal- Pudding Cup -- Pharyngeal  -- Pharyngeal- Honey Teaspoon -- Pharyngeal -- Pharyngeal- Honey Cup -- Pharyngeal -- Pharyngeal- Nectar Teaspoon -- Pharyngeal -- Pharyngeal- Nectar Cup -- Pharyngeal -- Pharyngeal- Nectar Straw -- Pharyngeal -- Pharyngeal- Thin Teaspoon -- Pharyngeal -- Pharyngeal- Thin Cup -- Pharyngeal -- Pharyngeal- Thin Straw -- Pharyngeal -- Pharyngeal- Puree -- Pharyngeal -- Pharyngeal- Mechanical Soft -- Pharyngeal -- Pharyngeal- Regular -- Pharyngeal -- Pharyngeal- Multi-consistency -- Pharyngeal -- Pharyngeal- Pill -- Pharyngeal -- Pharyngeal Comment --  CHL IP CERVICAL ESOPHAGEAL PHASE 08/16/2019 Cervical Esophageal Phase Impaired Pudding Teaspoon -- Pudding Cup -- Honey Teaspoon -- Honey Cup -- Nectar Teaspoon -- Nectar Cup -- Nectar Straw -- Thin Teaspoon -- Thin Cup -- Thin Straw -- Puree -- Mechanical Soft -- Regular -- Multi-consistency -- Pill -- Cervical Esophageal Comment See Clinical Impressions  Shanika I. Hardin Negus, [place], [place] Office number [phone] Pager [place] 08/16/2019, 12:40 PM              DG MINI C-ARM IMAGE ONLY  Result Date: 07/20/2019 There is no interpretation for this exam.  This order is for images obtained during a surgical procedure.  Please See "Surgeries" Tab for more information regarding the procedure.   ECHOCARDIOGRAM COMPLETE  Result Date: 08/15/2019    ECHOCARDIOGRAM REPORT   Patient Name:   [name] Date of Exam: 08/15/2019 Medical Rec #:  [id number]     Height:       62.0 in Accession #:    [id number]    Weight:       200.6 lb Date of Birth:  Jun 16, 1934     BSA:          1.914 m Patient Age:    20 years      BP:           118/43 mmHg Patient Gender: F             HR:           69 bpm. Exam Location:  Inpatient Procedure: 2D Echo, Cardiac Doppler and Color Doppler Indications:    Heart Failure  History:        Patient has prior history of Echocardiogram examinations, most                 recent 07/14/2018. CHF,  Arrythmias:Atrial Fibrillation and PVC;  Risk Factors:Dyslipidemia and Hypertension. Morbid obesity, OSA                 on CPAP, H/o recurrent pulmonary embolism, on Coumadin.  Sonographer:    Alvino Chapel RCS Referring Phys: [phone] A CALDWELL [place]  1. Left ventricular ejection fraction, by estimation, is 65 to 70%. The left ventricle has normal function. The left ventricle has no regional wall motion abnormalities. There is mild left ventricular hypertrophy. Left ventricular diastolic parameters are indeterminate.  2. Right ventricular systolic function is normal. The right ventricular size is normal. There is normal pulmonary artery systolic pressure. The estimated right ventricular systolic pressure is 49.2 mmHg.  3. The mitral valve is abnormal, mild to moderate annular calcification. Mild mitral valve regurgitation.  4. The aortic valve is tricuspid. Aortic valve regurgitation is not visualized.  5. The inferior vena cava is dilated in size with >50% respiratory variability, suggesting right atrial pressure of 8 mmHg.  6. Promiment circumferential pericardial fat pad. FINDINGS  Left Ventricle: Left ventricular ejection fraction, by estimation, is 65 to 70%. The left ventricle has normal function. The left ventricle has no regional wall motion abnormalities. The left ventricular internal cavity size was normal in size. There is  mild left ventricular hypertrophy. Left ventricular diastolic parameters are indeterminate. Right Ventricle: The right ventricular size is normal. No increase in right ventricular wall thickness. Right ventricular systolic function is normal. There is normal pulmonary artery systolic pressure. The tricuspid regurgitant velocity is 2.21 m/s, and  with an assumed right atrial pressure of 8 mmHg, the estimated right ventricular systolic pressure is 01.0 mmHg. Left Atrium: Left atrial size was normal in size. Right Atrium: Right atrial size was normal in  size. Pericardium: There is no evidence of pericardial effusion. Presence of pericardial fat pad. Mitral Valve: The mitral valve is abnormal. Mild to moderate mitral annular calcification. Mild mitral valve regurgitation. Tricuspid Valve: The tricuspid valve is grossly normal. Tricuspid valve regurgitation is mild. Aortic Valve: The aortic valve is tricuspid. Aortic valve regurgitation is not visualized. Mild aortic valve annular calcification. Aortic valve mean gradient measures 5.0 mmHg. Aortic valve peak gradient measures 9.5 mmHg. Aortic valve area, by VTI measures 2.35 cm. Pulmonic Valve: The pulmonic valve was grossly normal. Pulmonic valve regurgitation is trivial. Aorta: The aortic root is normal in size and structure. Venous: The inferior vena cava is dilated in size with greater than 50% respiratory variability, suggesting right atrial pressure of 8 mmHg. IAS/Shunts: No atrial level shunt detected by color flow Doppler.  LEFT VENTRICLE PLAX 2D LVIDd:         4.50 cm LVIDs:         2.60 cm LV PW:         1.10 cm LV IVS:        0.90 cm LVOT diam:     1.90 cm LV SV:         67 LV SV Index:   35 LVOT Area:     2.84 cm  RIGHT VENTRICLE RV S prime:     9.03 cm/s TAPSE (M-mode): 1.8 cm LEFT ATRIUM             Index       RIGHT ATRIUM           Index LA diam:        4.80 cm 2.51 cm/m  RA Area:     22.50 cm LA Vol (A2C):   69.4 ml 36.26  ml/m RA Volume:   59.90 ml  31.29 ml/m LA Vol (A4C):   47.7 ml 24.92 ml/m LA Biplane Vol: 60.5 ml 31.61 ml/m  AORTIC VALVE AV Area (Vmax):    2.03 cm AV Area (Vmean):   2.02 cm AV Area (VTI):     2.35 cm AV Vmax:           154.00 cm/s AV Vmean:          101.000 cm/s AV VTI:            0.284 m AV Peak Grad:      9.5 mmHg AV Mean Grad:      5.0 mmHg LVOT Vmax:         110.00 cm/s LVOT Vmean:        72.000 cm/s LVOT VTI:          0.235 m LVOT/AV VTI ratio: 0.83  AORTA Ao Root diam: 3.10 cm MITRAL VALVE                TRICUSPID VALVE MV Area (PHT): 4.60 cm     TR Peak grad:    19.5 mmHg MV Decel Time: 165 msec     TR Vmax:        221.00 cm/s MV E velocity: 140.00 cm/s                             SHUNTS                             Systemic VTI:  0.24 m                             Systemic Diam: 1.90 cm Rozann Lesches MD Electronically signed by Rozann Lesches MD Signature Date/Time: 08/15/2019/5:18:17 PM    Final       Subjective: Feels that breathing is improving. Continues to have pain in her right arm. Has developed a hematoma on back of left hand.   Discharge Exam: Vitals:   08/17/19 2115 08/18/19 0419 08/18/19 0836 08/18/19 1324  BP: (!) 110/57 (!) 114/48 (!) 108/47 (!) 108/91  Pulse: (!) 56 66 74 (!) 104  Resp: 20 20  20   Temp: 98.1 F (36.7 C) 98.5 F (36.9 C) (!) 97.4 F (36.3 C) 98.1 F (36.7 C)  TempSrc: Oral Oral Oral Oral  SpO2: 97% 96% 96% 93%  Weight:  84.8 kg    Height:        General: Pt is alert, awake, not in acute distress Cardiovascular: RRR, S1/S2 +, no rubs, no gallops Respiratory: CTA bilaterally, no wheezing, no rhonchi Abdominal: Soft, NT, ND, bowel sounds + Extremities: no edema. Bruising noted over left hand with hematoma on back of left hand. Pulses intact and no pain on palpation    The results of significant diagnostics from this hospitalization (including imaging, microbiology, ancillary and laboratory) are listed below for reference.     Microbiology: Recent Results (from the past 240 hour(s))  SARS Coronavirus 2 by RT PCR (hospital order, performed in [facility] hospital lab) Nasopharyngeal Nasopharyngeal Swab     Status: None   Collection Time: 08/15/19  3:56 AM   Specimen: Nasopharyngeal Swab  Result Value Ref Range Status   SARS Coronavirus 2 NEGATIVE NEGATIVE Final    Comment: (NOTE) SARS-CoV-2 target nucleic acids are NOT DETECTED.  The SARS-CoV-2 RNA is generally detectable in upper and lower respiratory specimens during the acute phase of infection. The lowest concentration of SARS-CoV-2 viral copies  this assay can detect is 250 copies / mL. A negative result does not preclude SARS-CoV-2 infection and should not be used as the sole basis for treatment or other patient management decisions.  A negative result may occur with improper specimen collection / handling, submission of specimen other than nasopharyngeal swab, presence of viral mutation(s) within the areas targeted by this assay, and inadequate number of viral copies (<250 copies / mL). A negative result must be combined with clinical observations, patient history, and epidemiological information.  Fact Sheet for Patients:   StrictlyIdeas.no  Fact Sheet for Healthcare Providers: BankingDealers.co.za  This test is not yet approved or  cleared by the [place] FDA and has been authorized for detection and/or diagnosis of SARS-CoV-2 by FDA under an Emergency Use Authorization (EUA).  This EUA will remain in effect (meaning this test can be used) for the duration of the COVID-19 declaration under Section 564(b)(1) of the Act, 21 U.S.C. section 360bbb-3(b)(1), unless the authorization is terminated or revoked sooner.  Performed at [facility], [place] [address], [place], [place] [address]      Labs: BNP (last 3 results) Recent Labs    12/27/18 0340 08/15/19 0307  BNP 300.3* 299.2*   Basic Metabolic Panel: Recent Labs  Lab 08/15/19 0306 08/15/19 0727 08/16/19 0433 08/17/19 0413 08/18/19 0836  NA 143  --  143 143 143  K 4.1  --  3.3* 4.7 3.7  CL 107  --  101 100 96*  CO2 31  --  33* 35* 36*  GLUCOSE 128*  --  113* 116* 129*  BUN 15  --  17 25* 20  CREATININE 0.82  --  0.82 0.79 0.84  CALCIUM 8.9  --  8.3* 8.4* 8.7*  MG  --  1.5* 1.6* 1.9 1.7  PHOS  --   --   --  3.4  --    Liver Function Tests: No results for input(s): AST, ALT, ALKPHOS, BILITOT, PROT, ALBUMIN in the last 168 hours. No results for input(s): LIPASE, AMYLASE in the last 168 hours. No  results for input(s): AMMONIA in the last 168 hours. CBC: Recent Labs  Lab 08/15/19 0306 08/16/19 0433 08/16/19 1532 08/17/19 0413 08/18/19 0836  WBC 5.6 4.1  --  4.0 4.4  NEUTROABS  --  2.8  --  2.6  --   HGB 8.7* 7.6* 7.6* 8.3* 8.4*  HCT 30.1* 26.6* 26.8* 29.5* 29.7*  MCV 97.1 96.0  --  95.8 95.5  PLT 179 149*  --  179 188   Cardiac Enzymes: No results for input(s): CKTOTAL, CKMB, CKMBINDEX, TROPONINI in the last 168 hours. BNP: Invalid input(s): POCBNP CBG: No results for input(s): GLUCAP in the last 168 hours. D-Dimer No results for input(s): DDIMER in the last 72 hours. Hgb A1c No results for input(s): HGBA1C in the last 72 hours. Lipid Profile No results for input(s): CHOL, HDL, LDLCALC, TRIG, CHOLHDL, LDLDIRECT in the last 72 hours. Thyroid function studies No results for input(s): TSH, T4TOTAL, T3FREE, THYROIDAB in the last 72 hours.  Invalid input(s): FREET3 Anemia work up Recent Labs    08/15/19 1527  VITAMINB12 518  FOLATE 25.4   Urinalysis    Component Value Date/Time   COLORURINE YELLOW 09/08/2018 [place] 09/08/2018 1711   LABSPEC 1.010 09/08/2018 1711   PHURINE  6.0 09/08/2018 1711   GLUCOSEU NEGATIVE 09/08/2018 1711   HGBUR SMALL (A) 09/08/2018 1711   BILIRUBINUR NEGATIVE 09/08/2018 1711   KETONESUR NEGATIVE 09/08/2018 1711   PROTEINUR 30 (A) 09/08/2018 1711   UROBILINOGEN 0.2 04/02/2013 0554   NITRITE NEGATIVE 09/08/2018 1711   LEUKOCYTESUR SMALL (A) 09/08/2018 1711   Sepsis Labs Invalid input(s): PROCALCITONIN,  WBC,  LACTICIDVEN Microbiology Recent Results (from the past 240 hour(s))  SARS Coronavirus 2 by RT PCR (hospital order, performed in [place] hospital lab) Nasopharyngeal Nasopharyngeal Swab     Status: None   Collection Time: 08/15/19  3:56 AM   Specimen: Nasopharyngeal Swab  Result Value Ref Range Status   SARS Coronavirus 2 NEGATIVE NEGATIVE Final    Comment: (NOTE) SARS-CoV-2 target nucleic acids are  NOT DETECTED.  The SARS-CoV-2 RNA is generally detectable in upper and lower respiratory specimens during the acute phase of infection. The lowest concentration of SARS-CoV-2 viral copies this assay can detect is 250 copies / mL. A negative result does not preclude SARS-CoV-2 infection and should not be used as the sole basis for treatment or other patient management decisions.  A negative result may occur with improper specimen collection / handling, submission of specimen other than nasopharyngeal swab, presence of viral mutation(s) within the areas targeted by this assay, and inadequate number of viral copies (<250 copies / mL). A negative result must be combined with clinical observations, patient history, and epidemiological information.  Fact Sheet for Patients:   StrictlyIdeas.no  Fact Sheet for Healthcare Providers: BankingDealers.co.za  This test is not yet approved or  cleared by the [place] FDA and has been authorized for detection and/or diagnosis of SARS-CoV-2 by FDA under an Emergency Use Authorization (EUA).  This EUA will remain in effect (meaning this test can be used) for the duration of the COVID-19 declaration under Section 564(b)(1) of the Act, 21 U.S.C. section 360bbb-3(b)(1), unless the authorization is terminated or revoked sooner.  Performed at [facility], [place] [address], [place], [place] [address]      Time coordinating discharge: 14mns  SIGNED:   JKathie Dike MD  [facility] 08/18/2019, 2:18 PM   If 7PM-7AM, please contact night-coverage [email or web address]

## 2019-08-18 NOTE — Progress Notes (Signed)
Inpatient Rehab Admissions Coordinator:   I have a bed available and Dr. Roderic Palau in agreement for pt to admit to CIR today.  Will let pt/family and TOC team know.   Shann Medal, PT, DPT Admissions Coordinator [phone] 08/18/19  2:10 PM

## 2019-08-18 NOTE — H&P (Signed)
Physical Medicine and Rehabilitation Admission H&P    Chief Complaint  Patient presents with  . debility    HPI:  [name] is an 85 year old female with history of HTN, OSA-on CPAP, CAF- on coumadin, chronic diastolic CHF, morbid obesity, DJD/DDD with chronic pain, recent admission for left femur Fx s/p ORIF and right humerus fx with reverse total shoulder on 07/20/2019 and was sent to [facility] for rehab.  Patient came in for review and patient.  She reports that she was have increase in SOB with weigh gain and was discharged to home on 08/14/2019 but readmitted on 08/15/2019 with CHF exacerbation.  She had progressive dyspnea.  She was treated with IV diuresis as well as non-rebreather mask.  She was found to have hypomagnesemia which was supplemented, anemia with Hgb 8.7 and reported problem swallowing.  MBS done revealing normal swallow and symptoms felt to be due to GERD. She has been treated with IV diuresis with resolution of dyspnea and continues to require supplemental oxygen.  Weight down to 187 lbs but follow up CXR  today with increasing infiltrates.  Therapy ongoing and patient continues to be limited by balance deficits and requires frequent rest breaks. Reported to have to hypoxia with SOB and fatigue with minimal activity. CIR recommended due to functional decline.  Please see preadmission assessment from earlier today as well.   Review of Systems  Constitutional: Negative for chills and fever.  HENT: Positive for hearing loss. Negative for tinnitus.   Eyes: Negative for blurred vision.  Respiratory: Positive for shortness of breath (with activity). Negative for cough and wheezing.   Cardiovascular: Negative for chest pain and palpitations.  Gastrointestinal: Positive for heartburn. Negative for constipation and nausea.  Genitourinary: Negative for dysuria and urgency.       Gets up once at night.   Musculoskeletal: Positive for back pain, joint pain and myalgias.  Skin:  Negative for rash.  Neurological: Positive for weakness. Negative for dizziness and headaches.  Endo/Heme/Allergies: Negative for environmental allergies. Does not bruise/bleed easily.  Psychiatric/Behavioral: The patient is not nervous/anxious and does not have insomnia.   All other systems reviewed and are negative.   Past Medical History:  Diagnosis Date  . Atrial fibrillation ([place])   . CAP (community acquired pneumonia) 12/27/2018  . Carotid artery occlusion   . Chronic diastolic CHF (congestive heart failure) (HCC)    Hypertensive heart disease 02-12-14  . Crohn's disease ([place])   . Detached retina   . Fibromyalgia   . GERD (gastroesophageal reflux disease)   . Gout   . H/O hiatal hernia   . High triglycerides   . History of stomach ulcers 1950's  . Hypertension   . Long term current use of anticoagulant   . Obstructive sleep apnea on CPAP   . Osteoarthritis    PAIN AND OA LEF T HIP AND BOTH SHOULDERS ARE BONE ON BONE AND PAINFUL  . Peripheral vascular disease (HCC)    KNOWN RIGHT INTERNAL CAROTID ARTERY OCCLUSION (NO STROKE)  --40 TO 59% STENOSIS LEFT ICA-FOLLOWED BY DR. EARLY WITH DOPPLER STUDY EVERY 6 MONTHS  . Pulmonary embolism ([place]) 2011   a. Hx of PE in 02/2009 after R hip surgery, venous dopplers negative, long-term Coumadin.  [name] Kitchen PVC (premature ventricular contraction)    PT STATES HX OF PVC'S ON EKG  . Recurrent upper respiratory infection (URI)    BRONCHITIS FEB 2013--SLIGHT COUGH NON-PRODUCTIVE NOW  . Recurrent UTI (urinary tract infection)    "  on daily medicine" (05/14/2012)  . Tinnitus   . Vertigo     Past Surgical History:  Procedure Laterality Date  . APPENDECTOMY  1950's  . BACK SURGERY  10/29/06   Central and foraminal decompression L3-L4, L4-L5, and L5-S1 with inspection of L4-L5 and L5-S1 disc on the right  . CARDIAC CATHETERIZATION  1970's   "maybe 2" (05/14/2012)  . CARDIAC CATHETERIZATION    . CATARACT EXTRACTION W/ INTRAOCULAR LENS IMPLANT Right 2009   . CHOLECYSTECTOMY  ~ 1988  . DECOMPRESSIVE LUMBAR LAMINECTOMY LEVEL 3  ~ 2002  . DILATION AND CURETTAGE OF UTERUS     "several; from miscarriages" (05/14/2012)  . EXCISION MORTON'S NEUROMA Right 05/20/00   "foot" (05/14/2012)  . EYE SURGERY Left 2014   Detached retina  . EYE SURGERY Left August 23, 2013   Cataract  . FEMUR FRACTURE SURGERY Right 02/21/09   Open reduction internal fixation of right periprosthetic  femur fracture utilizing Zimmer cables times fiv  . FRACTURE SURGERY  2011   Right Femur Fx  . HAMMER TOE SURGERY Left 10/25/08   "toe next to big to" (05/14/2012)  . JOINT REPLACEMENT  2009   Right Hip replacement  . JOINT REPLACEMENT  2012   Right knee replacement  . JOINT REPLACEMENT  06/17/11   Left Hip replacement  . KNEE ARTHROSCOPY Right 07/26/05  . REPLACEMENT TOTAL KNEE Right 02/19/2010  . REVISION TOTAL SHOULDER TO REVERSE TOTAL SHOULDER Right 07/20/2019   Procedure: REVERSE TOTAL SHOULDER REPLACEMENT;  Surgeon: Nicholes Stairs, MD;  Location: [facility];  Service: Orthopedics;  Laterality: Right;  . SPINE SURGERY    . [place]?  . TOTAL HIP ARTHROPLASTY Right 12/08/02   Osteonics total hip replacement  . TOTAL HIP ARTHROPLASTY  06/17/2011   Procedure: TOTAL HIP ARTHROPLASTY;  Surgeon: Gearlean Alf, MD;  Location: [facility];  Service: Orthopedics;  Laterality: Left;  . TOTAL SHOULDER ARTHROPLASTY Left 05/14/2012  . TOTAL SHOULDER ARTHROPLASTY Left 05/14/2012   Procedure: TOTAL SHOULDER ARTHROPLASTY;  Surgeon: Marin Shutter, MD;  Location: [facility];  Service: Orthopedics;  Laterality: Left;  [name] Kitchen VAGINAL HYSTERECTOMY  1971    Family History  Problem Relation Age of Onset  . Heart disease Father        Heart Disease before age 15  . Heart attack Father 61  . Hypertension Father   . Peripheral vascular disease Father        Right  leg amputation  . Heart disease Mother        AAA  . Stroke Mother   . Aneurysm Mother   . Hypertension Mother   . AAA  (abdominal aortic aneurysm) Mother   . Diabetes Paternal Grandfather   . Hypertension Brother   . Heart disease Brother        Heart Disease before age 83  . Hyperlipidemia Brother   . Heart attack Brother   . Hypertension Brother   . Diabetes Son     Social History:  Married. Retired Marine scientist. Used cane in home and walker out of home prior to fracture. She reports that she quit smoking about 33 years ago. Her smoking use included cigarettes. She has a 7.50 pack-year smoking history. She has never used smokeless tobacco. She reports current alcohol use. She reports that she does not use drugs.    Allergies  Allergen Reactions  . Codeine     NAUSEA  . Cymbalta [Duloxetine Hcl]     Nausea  VOMITING AND ABDOMINAL PAIN, HEADACHE, JUST ABOUT EVERY SIDE EFFECT THE DRUG HAS  . Diltiazem Cd [Diltiazem Hcl Er Beads]     Heavy legs, cough  . Duloxetine     Other reaction(s): Other (See Comments) Nausea VOMITING AND ABDOMINAL PAIN, HEADACHE, JUST ABOUT EVERY SIDE EFFECT THE DRUG HAS  . Metoprolol     Legs like cement  . Mirtazapine Other (See Comments)    nightmares  . Nortriptyline     Nightmares  . Penicillins     RASH  & ITCHING   --PT STATES SHE CAN TAKE KEFLEX PO AND IV CEPHALOSPORINS    Tolerate IV ancef on 07/20/2019  . Statins     Leg cramps  . Tetanus Toxoids     SWELLING, REDNESS  WHOLE ARM  . Ciprofloxacin Rash    RASH  . Lyrica [Pregabalin] Diarrhea, Nausea And Vomiting and Rash    Medications Prior to Admission  Medication Sig Dispense Refill  . allopurinol (ZYLOPRIM) 300 MG tablet Take 300 mg by mouth daily.    [name] Kitchen amLODipine (NORVASC) 5 MG tablet Take 5 mg by mouth daily.    . benzonatate (TESSALON) 100 MG capsule Take 100 mg by mouth 3 (three) times daily as needed for cough.    . brimonidine (ALPHAGAN) 0.2 % ophthalmic solution Place 1 drop into the right eye 2 (two) times daily.     . cholecalciferol (VITAMIN D3) 25 MCG (1000 UNIT) tablet Take 1,000 Units by mouth  daily.    . cimetidine (TAGAMET) 200 MG tablet Take 200 mg by mouth 2 (two) times daily.    [name] Kitchen CRANBERRY CONCENTRATE PO Take 1 tablet by mouth daily.    . dorzolamide-timolol (COSOPT) 22.3-6.8 MG/ML ophthalmic solution Place 1 drop into both eyes 2 (two) times daily.     . furosemide (LASIX) 20 MG tablet Take 1 tab (74m)  daily for a week, then continue 1/2 tab (166m daily (Patient taking differently: Take 10-20 mg by mouth daily as needed for fluid (per weight gain). ) 90 tablet 1  . ipratropium (ATROVENT) 0.06 % nasal spray Place 1 spray into both nostrils daily as needed for rhinitis.     . [name] Kitchenetoconazole (NIZORAL) 2 % cream Apply 1 application topically See admin instructions. Use 1 to 2 times daily    . loratadine (CLARITIN) 10 MG tablet Take 10 mg by mouth daily as needed for allergies.    . [name] KitchenUMIGAN 0.01 % SOLN Place 1 drop into both eyes at bedtime.    . Multiple Vitamin (MULTIVITAMIN WITH MINERALS) TABS tablet Take 1 tablet by mouth daily.    . nebivolol (BYSTOLIC) 10 MG tablet Take 1 tablet (10 mg total) by mouth at bedtime. 90 tablet 3  . potassium chloride (KLOR-CON) 10 MEQ tablet Take 2 tablets (20 mEq total) by mouth daily. 60 tablet 1  . RHOPRESSA 0.02 % SOLN Place 1 drop into the right eye at bedtime.    . triamcinolone (NASACORT ALLERGY 24HR) 55 MCG/ACT AERO nasal inhaler Place 2 sprays into the nose daily as needed (allergies).     . [DISCONTINUED] HYDROcodone-acetaminophen (NORCO/VICODIN) 5-325 MG tablet Take 1 tablet by mouth every 4 (four) hours as needed for moderate pain. 35 tablet 0  . [DISCONTINUED] warfarin (COUMADIN) 2.5 MG tablet TAKE 1 TO 1&1/2 TABLETS DAILY AS DIRECTED BY COUMADIN CLINIC. (Patient taking differently: Take 2.5 mg by mouth See admin instructions. Takes 2.5 mg daily except Monday and Friday. Takes 3.75 mgs on Mondays & Fridays.) 120  tablet 0  . mirtazapine (REMERON) 7.5 MG tablet Take 7.5 mg by mouth at bedtime. (Patient not taking: Reported on 08/15/2019)     . mupirocin ointment (BACTROBAN) 2 % Apply 1 application topically 2 (two) times daily. (Patient not taking: Reported on 08/15/2019) 30 g 2  . silver sulfADIAZINE (SILVADENE) 1 % cream Apply 1 application topically daily. (Patient not taking: Reported on 08/15/2019) 50 g 0    Drug Regimen Review  Drug regimen was reviewed and remains appropriate with no significant issues identified  Home: Home Living Family/patient expects to be discharged to:: Private residence Living Arrangements: Spouse/significant other Available Help at Discharge: Family, Available 24 hours/day Type of Home: House Home Access: Stairs to enter [employer] of Steps: 5 Entrance Stairs-Rails: Left Home Layout: One level Bathroom Shower/Tub: Chiropodist: [place]: Environmental consultant - 2 wheels, Environmental consultant - 4 wheels, Grab bars - tub/shower, Grab bars - toilet, Cane - single point, Industrial/product designer History: Prior Function Comments: pt previously in [facility] for rehab, working with OT/PT on mobilizing with [place] and ADL completion, pt reports was home less than a day from rehab but couldn't get into her home or move around (non emergency EMT had to assist for getting into home)   Functional Status:  Mobility: Bed Mobility Overal bed mobility: Needs Assistance Bed Mobility: Supine to Sit Supine to sit: Mod assist, HOB elevated General bed mobility comments: ModA for LUE support to elevate trunk Transfers Overall transfer level: Needs assistance Equipment used: Straight cane, None Transfers: Sit to/from Stand Sit to Stand: Min assist, Min guard Stand pivot transfers: Min assist General transfer comment: Initial minA to elevate trunk and maintain balance upon standing, progressing to min guard on subsequent trials from recliner, improving technique with SPC Ambulation/Gait Ambulation/Gait assistance: Min guard, Min assist, Mod assist, +2 safety/equipment Gait Distance (Feet):  20 Feet (+ 42' + 50') Assistive device: Straight cane Gait Pattern/deviations: Step-through pattern, Decreased stride length, Trunk flexed, Staggering right, Staggering left General Gait Details: Slow, unsteady gait with SPC and frequent min-modA to prevent LOB; +2 assist for safety/chair follow. 3x seated rest break secondary to fatigue and DOE. SpO2 down to 86% on 2L O2 [place], returning to 89-94% with seated rest and deep breathing Gait velocity: Decreased Gait velocity interpretation: <1.31 ft/sec, indicative of household ambulator    ADL: ADL Overall ADL's : Needs assistance/impaired Eating/Feeding: Set up, Sitting Grooming: Minimal assistance, Sitting Upper Body Bathing: Moderate assistance, Sitting Lower Body Bathing: Maximal assistance, Sitting/lateral leans, Sit to/from stand, +2 for safety/equipment, +2 for physical assistance Upper Body Dressing : Total assistance, Sitting Upper Body Dressing Details (indicate cue type and reason): assist for sling management  Lower Body Dressing: Maximal assistance, Sitting/lateral leans, Sit to/from stand, +2 for safety/equipment, +2 for physical assistance Lower Body Dressing Details (indicate cue type and reason): minA for standing balance  Toilet Transfer: Minimal assistance, Stand-pivot, BSC Toilet Transfer Details (indicate cue type and reason): MIN A for balance to pivot to [facility] with HHA Toileting- Clothing Manipulation and Hygiene: Total assistance, Sit to/from stand Toileting - Clothing Manipulation Details (indicate cue type and reason): total A for posterior pericare post BM Functional mobility during ADLs: Minimal assistance (stand pivot only) General ADL Comments: pt limited by fatigue and decreased activity tolerance  Cognition: Cognition Overall Cognitive Status: Impaired/Different from baseline Cognition Arousal/Alertness: Awake/alert Behavior During Therapy: WFL for tasks assessed/performed Overall Cognitive Status:  Impaired/Different from baseline Area of Impairment: Attention Current Attention Level: Selective  General Comments: WFL for majority of simple tasks, some difficulty multi-tasking noted   Blood pressure (!) 108/91, pulse (!) 104, temperature 98.1 F (36.7 C), temperature source Oral, resp. rate 20, height 5' 2"  (1.575 m), weight 84.8 kg, SpO2 93 %. Physical Exam Vitals and nursing note reviewed.  Constitutional:      Appearance: She is obese.  HENT:     Head: Normocephalic.     Comments: Left extremity normal Right extremity normal    Right Ear: External ear normal.     Left Ear: External ear normal.     Nose: Nose normal.  Eyes:     Extraocular Movements: Extraocular movements intact.     Comments: No discharge bilateral eyes  Pulmonary:     Effort: Pulmonary effort is normal. No tachypnea or respiratory distress.  Abdominal:     General: Abdomen is flat. There is no distension.     Comments: Large pannus with irritation bilateral inguinal folds.   Musculoskeletal:     Cervical back: Normal range of motion and neck supple.     Comments: Left shoulder edema Left wrist edema  Skin:    General: Skin is warm and dry.     Comments: Right shoulder incision healed.  Left forearm ecchymosis and hematoma v/s fluid filled bulla on left wrist.   Neurological:     Mental Status: She is alert.     Comments: Alert Motor: Right upper extremity limited by sling proximally, moving them freely. Left upper extremity: 5/5 proximal distal Left lower extremity: 4-4+/5 proximal distal Right lower extremity: 5/5 proximal distal  Psychiatric:        Mood and Affect: Affect is blunt.        Behavior: Behavior normal.     Results for orders placed or performed during the hospital encounter of 08/15/19 (from the past 48 hour(s))  Magnesium     Status: None   Collection Time: 08/17/19  4:13 AM  Result Value Ref Range   Magnesium 1.9 1.7 - 2.4 mg/dL    Comment: Performed at [facility], [place] [address] [place], [place] [address]  Basic metabolic panel     Status: Abnormal   Collection Time: 08/17/19  4:13 AM  Result Value Ref Range   Sodium 143 135 - 145 mmol/L   Potassium 4.7 3.5 - 5.1 mmol/L   Chloride 100 98 - 111 mmol/L   CO2 35 (H) 22 - 32 mmol/L   Glucose, Bld 116 (H) 70 - 99 mg/dL    Comment: Glucose reference range applies only to samples taken after fasting for at least 8 hours.   BUN 25 (H) 8 - 23 mg/dL   Creatinine, Ser 0.79 0.44 - 1.00 mg/dL   Calcium 8.4 (L) 8.9 - 10.3 mg/dL   GFR calc non Af Amer >60 >60 mL/min   GFR calc Af Amer >60 >60 mL/min   Anion gap 8 5 - 15    Comment: Performed at [facility] [address], [place], [place] [address]  Protime-INR     Status: Abnormal   Collection Time: 08/17/19  4:13 AM  Result Value Ref Range   Prothrombin Time 29.5 (H) 11.4 - 15.2 seconds   INR 2.9 (H) 0.8 - 1.2    Comment: (NOTE) INR goal varies based on device and disease states. Performed at [facility], [place] [address], [place], [place] [address]   CBC with Differential/Platelet     Status: Abnormal  Collection Time: 08/17/19  4:13 AM  Result Value Ref Range   WBC 4.0 4.0 - 10.5 K/uL   RBC 3.08 (L) 3.87 - 5.11 MIL/uL   Hemoglobin 8.3 (L) 12.0 - 15.0 g/dL   HCT 29.5 (L) 36 - 46 %   MCV 95.8 80.0 - 100.0 fL   MCH 26.9 26.0 - 34.0 pg   MCHC 28.1 (L) 30.0 - 36.0 g/dL   RDW 17.9 (H) 11.5 - 15.5 %   Platelets 179 150 - 400 K/uL   nRBC 0.5 (H) 0.0 - 0.2 %   Neutrophils Relative % 64 %   Neutro Abs 2.6 1.7 - 7.7 K/uL   Lymphocytes Relative 19 %   Lymphs Abs 0.8 0.7 - 4.0 K/uL   Monocytes Relative 9 %   Monocytes Absolute 0.4 0 - 1 K/uL   Eosinophils Relative 5 %   Eosinophils Absolute 0.2 0 - 0 K/uL   Basophils Relative 1 %   Basophils Absolute 0.0 0 - 0 K/uL   Immature Granulocytes 2 %   Abs Immature Granulocytes 0.06 0.00 - 0.07 K/uL    Comment: Performed at [facility], [address], [place], [place]  [address]  Phosphorus     Status: None   Collection Time: 08/17/19  4:13 AM  Result Value Ref Range   Phosphorus 3.4 2.5 - 4.6 mg/dL    Comment: Performed at [facility] [address] [place], [place] [address]  Basic metabolic panel     Status: Abnormal   Collection Time: 08/18/19  8:36 AM  Result Value Ref Range   Sodium 143 135 - 145 mmol/L   Potassium 3.7 3.5 - 5.1 mmol/L   Chloride 96 (L) 98 - 111 mmol/L   CO2 36 (H) 22 - 32 mmol/L   Glucose, Bld 129 (H) 70 - 99 mg/dL    Comment: Glucose reference range applies only to samples taken after fasting for at least 8 hours.   BUN 20 8 - 23 mg/dL   Creatinine, Ser 0.84 0.44 - 1.00 mg/dL   Calcium 8.7 (L) 8.9 - 10.3 mg/dL   GFR calc non Af Amer >60 >60 mL/min   GFR calc Af Amer >60 >60 mL/min   Anion gap 11 5 - 15    Comment: Performed at [facility] [address], [place], [place] [address]  Magnesium     Status: None   Collection Time: 08/18/19  8:36 AM  Result Value Ref Range   Magnesium 1.7 1.7 - 2.4 mg/dL    Comment: Performed at [facility] [address] [place], [place] [address]  Protime-INR     Status: Abnormal   Collection Time: 08/18/19  8:36 AM  Result Value Ref Range   Prothrombin Time 27.0 (H) 11.4 - 15.2 seconds   INR 2.6 (H) 0.8 - 1.2    Comment: (NOTE) INR goal varies based on device and disease states. Performed at [facility], [place] [address] [place], [place] [address]   CBC     Status: Abnormal   Collection Time: 08/18/19  8:36 AM  Result Value Ref Range   WBC 4.4 4.0 - 10.5 K/uL   RBC 3.11 (L) 3.87 - 5.11 MIL/uL   Hemoglobin 8.4 (L) 12.0 - 15.0 g/dL   HCT 29.7 (L) 36 - 46 %   MCV 95.5 80.0 - 100.0 fL   MCH 27.0 26.0 - 34.0 pg   MCHC 28.3 (L) 30.0 -  36.0 g/dL   RDW 17.4 (H) 11.5 - 15.5 %   Platelets 188 150 - 400 K/uL   nRBC 0.5 (H) 0.0 - 0.2 %    Comment: Performed at [facility] [address] [place], [place] [address]   DG CHEST PORT 1 VIEW  Result  Date: 08/18/2019 CLINICAL DATA:  Hypoxia EXAM: PORTABLE CHEST 1 VIEW COMPARISON:  Portable exam 0554 hours compared to 08/16/2019 FINDINGS: Enlargement of cardiac silhouette. Atherosclerotic calcification aorta. Mediastinal contours normal. BILATERAL pulmonary infiltrates which could represent pulmonary edema or atypical infection. More focal opacities are seen at the lung bases question confluent infiltrate versus atelectasis. Minimal LEFT pleural effusion. No pneumothorax. Bones demineralized with BILATERAL shoulder prostheses noted. IMPRESSION: Enlargement of cardiac silhouette with favor pulmonary edema though atypical infection could have a similar pattern. Increased infiltrate versus atelectasis at lung bases with swelling of pleural effusion. Electronically Signed   By: Lavonia Dana M.D.   On: 08/18/2019 08:34       Medical Problem List and Plan: 1.  Balance deficits, DOE secondary to debility from CHF exacerbation, with recent history of right shoulder and left femur fracture.  -patient may shower  -ELOS/Goals: 10-14 days/supervision/min A.  Admit to CIR 2.  Antithrombotics: -DVT/anticoagulation:  Pharmaceutical: Coumadin  -antiplatelet therapy: N/A 3. Chronic back pain/Fibromyalgia/Pain Management: Per Dr. Royanne Foots hydrocodone prn at home   Monitor with increased exertion 4. Mood: LCSW to follow for evaluation and support.   -antipsychotic agents: N/A 5. Neuropsych: This patient is capable of making decisions on her own behalf. 6. Skin/Wound Care: Routine pressure relief measures. Will add nystatin for candida skin folds.  7. Fluids/Electrolytes/Nutrition: Monitor I/O. Strict I/Os.  CMP ordered. 8. Acute on chronic CHF: On Bystolic and Lasix --intolerant of statins.  Daily weights with strict I/O and HH diet. Now on lasix once daily starting starting tomorrow. She continues to drop to mid 80's with activity.  Will repeat two-view chest x-ray for follow up on increase in infiltrate.   9.  OSA: Has been refusing CPAP--discussed importance of use to help with respiratory/cardiac status. Husband to bring in her mask.  10. GERD: Symptoms worse since surgery but have been better since hospitalization. Will continue Pepcid (used Tagamet PTA). Resume probiotic. 11. HTN: Monitor BP tid --continue Norvasc and Lasix.   Mild with increased mobility. 12. Chronic hypomagnesemia: Does not like to use supplements due to SE of diarrhea. Mg-trending back down. Will add low dose Mag ox and monitor.   Mag level ordered. 13. ABLA: H/H still low. Will add iron supplement.   CBC ordered. 14. Right humerus fx: NWB RUE with sling. Pendulum exercises and AAROM with PT  15. Morbid obesity: Encouraged weight loss.  Bary Leriche, PA-C 08/18/2019  I have personally performed a face to face diagnostic evaluation, including, but not limited to relevant history and physical exam findings, of this patient and developed relevant assessment and plan.  Additionally, I have reviewed and concur with the physician assistant's documentation above.  Delice Lesch, MD, ABPMR  The patient's status has not changed. The original post admission physician evaluation remains appropriate, and any changes from the pre-admission screening or documentation from the acute chart are noted above.   Delice Lesch, MD, ABPMR

## 2019-08-18 NOTE — Progress Notes (Signed)
Inpatient Rehab Admissions:  Inpatient Rehab Consult received.  I met with patient and her husband at the bedside for rehabilitation assessment and to discuss goals and expectations of an inpatient rehab admission.  Both are interested in CIR for high intensity therapy before returning home.  I will follow for possible admission today, pending bed availability and MD approval.   Signed: Shann Medal, PT, DPT Admissions Coordinator [phone] 08/18/19  12:42 PM

## 2019-08-18 NOTE — Progress Notes (Signed)
Inpatient Rehabilitation Medication Review by a Pharmacist  A complete drug regimen review was completed for this patient to identify any potential clinically significant medication issues.  Clinically significant medication issues were identified:  no   Pharmacist comments: no medication issues found - anticoagulant reordered appropriately with pharmacy consult  Time spent performing this drug regimen review (minutes):  Elsie, PharmD Clinical Pharmacist  08/18/2019   8:11 PM

## 2019-08-18 NOTE — Progress Notes (Addendum)
Physical Therapy Treatment Patient Details Name: [name] MRN: [id number] DOB: 12-24-1934 Today's Date: 08/18/2019    History of Present Illness Pt is an 85 y.o. female recently d/c to SNF after R reverse TSA (07/20/19), was home one day before hospital readmission 08/15/19 with worsening SOB and weight gain. Pt hypertensive and required NRB. Workup for CHF exacerbation. PMH includes HTN, HLD, carotid stenosis, afib, OSA on CPAP, PE, CHF, GERD.   PT Comments    Pt progressing with mobility, remains motivated to participate and regain PLOF. Husband present and supportive during session. Session focused on gait training, strengthening and activity tolerance. Pt able to increase ambulation distance, although requires frequent seated rest breaks secondary to SOB and fatigue. SpO2 down to 86% on 2L O2 [place]. Pt requires consistent min-modA to maintain balance while ambulating without challenge; dependent for LB and posterior ADL tasks pt was previously independent with. Remains limited by generalized weakness, decreased activity tolerance and poor balance strategies/postural reactions. Continue to recommend intensive CIR-level therapies to maximize functional mobility and independence; pt and husband in agreement.    Follow Up Recommendations  CIR;Supervision for mobility/OOB     Equipment Recommendations  None recommended by PT    Recommendations for Other Services       Precautions / Restrictions Precautions Precautions: Fall;Shoulder Type of Shoulder Precautions: per last admission, okay for AROM E/W/H, OK for pendulums, Forward flexion to 90, no abduction, ER to neutral okay, no AROM shoulder Precaution Comments: per last admission "Ok to [employer] for cane or walker use" Restrictions Weight Bearing Restrictions: Yes RUE Weight Bearing: Non weight bearing    Mobility  Bed Mobility Overal bed mobility: Needs Assistance Bed Mobility: Supine to Sit     Supine to sit: Mod assist;HOB elevated      General bed mobility comments: ModA for LUE support to elevate trunk  Transfers Overall transfer level: Needs assistance Equipment used: Straight cane;None Transfers: Sit to/from Stand Sit to Stand: Min assist;Min guard         General transfer comment: Initial minA to elevate trunk and maintain balance upon standing, progressing to min guard on subsequent trials from recliner, improving technique with [facility]  Ambulation/Gait Ambulation/Gait assistance: Min guard;Min assist;Mod assist;+2 safety/equipment Gait Distance (Feet): 20 Feet (+ 42' + 50') Assistive device: Straight cane Gait Pattern/deviations: Step-through pattern;Decreased stride length;Trunk flexed;Staggering right;Staggering left Gait velocity: Decreased   General Gait Details: Slow, unsteady gait with SPC and frequent min-modA to prevent LOB; +2 assist for safety/chair follow. 3x seated rest break secondary to fatigue and DOE. SpO2 down to 86% on 2L O2 [place], returning to 89-94% with seated rest and deep breathing   Stairs             Wheelchair Mobility    Modified Rankin (Stroke Patients Only)       Balance Overall balance assessment: Needs assistance Sitting-balance support: Feet supported;No upper extremity supported Sitting balance-Leahy Scale: Fair Sitting balance - Comments: Unable to reach bilateral feet; dependent for LB ADL   Standing balance support: Single extremity supported Standing balance-Leahy Scale: Poor Standing balance comment: Reliant on UE support for static standing; dependent for posterior pericare                            Cognition Arousal/Alertness: Awake/alert Behavior During Therapy: WFL for tasks assessed/performed Overall Cognitive Status: Impaired/Different from baseline Area of Impairment: Attention  Current Attention Level: Selective           General Comments: WFL for majority of simple tasks, some difficulty multi-tasking  noted      Exercises Other Exercises Other Exercises: 5x repeated sit<>stand in 21.9 seconds (reliant on UE support to stand)    General Comments General comments (skin integrity, edema, etc.): Husband present and supportive      Pertinent Vitals/Pain Pain Assessment: Faces Faces Pain Scale: Hurts a little bit Pain Location: Generalized, R shoulder Pain Descriptors / Indicators: Grimacing;Aching;Sore Pain Intervention(s): Monitored during session    Home Living                      Prior Function            PT Goals (current goals can now be found in the care plan section) Progress towards PT goals: Progressing toward goals    Frequency    Min 3X/week      PT Plan Current plan remains appropriate    Co-evaluation              AM-PAC PT "6 Clicks" Mobility   Outcome Measure  Help needed turning from your back to your side while in a flat bed without using bedrails?: A Little Help needed moving from lying on your back to sitting on the side of a flat bed without using bedrails?: A Little Help needed moving to and from a bed to a chair (including a wheelchair)?: A Little Help needed standing up from a chair using your arms (e.g., wheelchair or bedside chair)?: A Little Help needed to walk in hospital room?: A Lot Help needed climbing 3-5 steps with a railing? : A Lot 6 Click Score: 16    End of Session Equipment Utilized During Treatment: Oxygen;Gait belt Activity Tolerance: Patient tolerated treatment well Patient left: in chair;with call bell/phone within reach;with chair alarm set;with family/visitor present Nurse Communication: Mobility status PT Visit Diagnosis: Unsteadiness on feet (R26.81);Other abnormalities of gait and mobility (R26.89);Muscle weakness (generalized) (M62.81);History of falling (Z91.81);Difficulty in walking, not elsewhere classified (R26.2);Pain     Time: 5015-8682 PT Time Calculation (min) (ACUTE ONLY): 28  min  Charges:  $Gait Training: 8-22 mins $Therapeutic Exercise: 8-22 mins                     Mabeline Caras, PT, DPT Acute Rehabilitation Services  Pager [phone] Office [place] 08/18/2019, 12:37 PM

## 2019-08-18 NOTE — Progress Notes (Signed)
ANTICOAGULATION CONSULT NOTE - Initial Consult  Pharmacy Consult for Warfarin Indication: atrial fibrillation  Patient Measurements: Height: 5' 2"  (157.5 cm) Weight: 84.8 kg (187 lb) (scale a) IBW/kg (Calculated) : 50.1  Vital Signs: Temp: 97.4 F (36.3 C) (07/07 0836) Temp Source: Oral (07/07 0836) BP: 108/47 (07/07 0836) Pulse Rate: 74 (07/07 0836)  Labs: Recent Labs    08/16/19 0433 08/16/19 0433 08/16/19 1532 08/16/19 1532 08/17/19 0413 08/18/19 0836  HGB 7.6*   < > 7.6*   < > 8.3* 8.4*  HCT 26.6*   < > 26.8*  --  29.5* 29.7*  PLT 149*  --   --   --  179 188  LABPROT 31.8*  --   --   --  29.5* 27.0*  INR 3.2*  --   --   --  2.9* 2.6*  CREATININE 0.82  --   --   --  0.79 0.84   < > = values in this interval not displayed.    Estimated Creatinine Clearance: 51.3 mL/min (by C-G formula based on SCr of 0.84 mg/dL).   Assessment: 85 yr old on warfarin for Afib, admitted for HF exacerbation. INR 2.8 on admit, increased to 3.2 on home dos, now 2.6. Eating 25-100% of meals. H/H, plt stable.   Home warfarin: 2.5 mg po daily, except for 3.75 mg po on Monday, Friday (pt reported taking 3 mg on 08/14/19 but has 2.78m tablets at home)   Goal of Therapy:  INR 2-3 Monitor platelets by anticoagulation protocol: Yes   Plan:  Warfarin [id number] Monitor daily INR, CBC Monitor for signs/symptoms of bleeding  LyBenetta SparPharmD, BCPS, BCCP Clinical Pharmacist  Please check AMION for all [place] numbers After 10:00 PM, call [facility] [place]

## 2019-08-18 NOTE — TOC Transition Note (Signed)
Transition of Care [facility]) - CM/SW Discharge Note   Patient Details  Name: [name] MRN: [id number] Date of Birth: October 31, 1934  [facility] of [facility] CM/SW Contact:  Zenon Mayo, RN Phone Number: 08/18/2019, 2:35 PM   Clinical Narrative:    DC to CIR today.  Final next level of care: IP Rehab Facility Barriers to Discharge: No Barriers Identified   Patient Goals and CMS Choice Patient states their goals for this hospitalization and ongoing recovery are:: IP Rehab CMS Medicare.gov Compare Post Acute Care list provided to:: Patient Choice offered to / list presented to : Patient, Spouse  Discharge Placement                       Discharge Plan and Services In-house Referral: Clinical Social Work   Post Acute Care Choice: [place]            DME Agency: NA       HH Arranged: NA          Social Determinants of Health (SDOH) Interventions     Readmission Risk Interventions Readmission Risk Prevention Plan 08/17/2019 07/21/2019  Transportation Screening Complete Complete  PCP or Specialist Appt within 3-5 Days Complete Complete  HRI or [place] Complete Complete  Social Work Consult for [place] Planning/Counseling Complete Complete  Palliative Care Screening Not Applicable Complete  Medication Review [employer] photographer) Referral to Pharmacy Complete  Some recent data might be hidden

## 2019-08-18 NOTE — Telephone Encounter (Signed)
This encounter was created in error - please disregard.

## 2019-08-18 NOTE — Progress Notes (Signed)
Pt refused cpap tonight states she wants to wait for her husband to bring hers.

## 2019-08-18 NOTE — PMR Pre-admission (Signed)
PMR Admission Coordinator Pre-Admission Assessment  Patient: [name] is an 85 y.o., female MRN: [id number] DOB: [birth year] Height: 5' 2"  (157.5 cm) Weight: 84.8 kg (scale a)  Insurance Information HMO:     PPO:      PCP:      IPA:      80/20:      OTHER:  PRIMARY: medicare a and b      Policy#: [id number]      Subscriber: pt CM Name:       Phone#:      Fax#:  Pre-Cert#: verified Civil engineer, contracting:  Benefits:  Phone #:      Name:  Eff. Date: A and B 07/12/2001     Deduct: $1484      Out of Pocket Max: n/a      Life Max: n/a CIR: 100%      SNF: 20 full days Outpatient: 80%     Co-Pay: 20% Home Health: 100%      Co-Pay:  DME: 80%     Co-Pay: 20% Providers: pt choice  SECONDARY: AARP      Policy#: [id number]     Phone#:   Financial Counselor:       Phone#:   The Data Collection Information Summary for patients in Inpatient Rehabilitation Facilities with attached Privacy Act [place] Records was provided and verbally reviewed with: Patient and Family  Emergency Contact Information Contact Information    Name Relation Home Work Mobile   [place] R Spouse [place] Son [phone]  [phone]      Current Medical History  Patient Admitting Diagnosis: functional decline 2/2 humeral fracture and CHF exacerbation  History of Present Illness: Pt is a 85 y/o female with PMH of GERD, PUD, HTN, HLD, carotid stenosis, afib, CHF, OSA (on cpap), and PE who [facility] with increasing SOB and weight gain.  Workup revealed CHF exacerbation and pt was requiring O2 up to 5L on admit with NRB use for limited mobility.  Pt reports intermittent O2 use at home.  Chest xray on 7/6 and 7/7 showed improved atelectasis and persistent pulmonary edema.  Pt admitted to [facility] for short term rehab 6/11 through 7/3 following a fall with a R complex humeral fracture, s/p reverse TSA.  Therapy evaluations were completed and  pt was recommended for CIR to maximize independence.     [facility]'s [facility] record from [facility] has been reviewed by the rehabilitation admission coordinator and physician.  Past Medical History  Past Medical History:  Diagnosis Date   Atrial fibrillation ([place])    CAP (community acquired pneumonia) 12/27/2018   Carotid artery occlusion    Chronic diastolic CHF (congestive heart failure) ([place])    Hypertensive heart disease 02-12-14   Crohn's disease ([place])    Detached retina    Fibromyalgia    GERD (gastroesophageal reflux disease)    Gout    H/O hiatal hernia    High triglycerides    History of stomach ulcers 1950's   Hypertension    Long term current use of anticoagulant    Obstructive sleep apnea on CPAP    Osteoarthritis    PAIN AND OA LEF T HIP AND BOTH SHOULDERS ARE BONE ON BONE AND PAINFUL   Peripheral vascular disease (HCC)    KNOWN RIGHT INTERNAL CAROTID ARTERY OCCLUSION (NO STROKE)  --40 TO 59% STENOSIS LEFT ICA-FOLLOWED BY DR. EARLY WITH DOPPLER STUDY EVERY  6 MONTHS   Pulmonary embolism ([place]) 2011   a. Hx of PE in 02/2009 after R hip surgery, venous dopplers negative, long-term Coumadin.   PVC (premature ventricular contraction)    PT STATES HX OF PVC'S ON EKG   Recurrent upper respiratory infection (URI)    BRONCHITIS FEB 2013--SLIGHT COUGH NON-PRODUCTIVE NOW   Recurrent UTI (urinary tract infection)    "on daily medicine" (05/14/2012)   Tinnitus    Vertigo     Family History   family history includes AAA (abdominal aortic aneurysm) in her mother; Aneurysm in her mother; Diabetes in her paternal grandfather and son; Heart attack in her brother; Heart attack (age of onset: 16) in her father; Heart disease in her brother, father, and mother; Hyperlipidemia in her brother; Hypertension in her brother, brother, father, and mother; Peripheral vascular disease in her father; Stroke in her mother.  Prior Rehab/Hospitalizations Has the patient had  prior rehab or hospitalizations prior to admission? Yes  Has the patient had major surgery during 100 days prior to admission? Yes   Current Medications  Current Facility-Administered Medications:    0.9 %  sodium chloride infusion, , Intravenous, PRN, Elodia Florence., MD, Stopped at 08/16/19 1430   allopurinol (ZYLOPRIM) tablet 300 mg, 300 mg, Oral, Daily, Fair, Chelsea N, MD, 300 mg at 08/18/19 0838   amLODipine (NORVASC) tablet 5 mg, 5 mg, Oral, Daily, Fair, Chelsea N, MD, 5 mg at 08/18/19 9735   benzonatate (TESSALON) capsule 100 mg, 100 mg, Oral, TID PRN, Fair, Chelsea N, MD   brimonidine (ALPHAGAN) 0.2 % ophthalmic solution 1 drop, 1 drop, Right Eye, BID, Fair, Marin Shutter, MD, 1 drop at 08/18/19 3299   cholecalciferol (VITAMIN D3) tablet 1,000 Units, 1,000 Units, Oral, Daily, Fair, Marin Shutter, MD, 1,000 Units at 08/18/19 0840   dorzolamide-timolol (COSOPT) 22.3-6.8 MG/ML ophthalmic solution 1 drop, 1 drop, Both Eyes, BID, Fair, Marin Shutter, MD, 1 drop at 08/18/19 0843   famotidine (PEPCID) tablet 10 mg, 10 mg, Oral, Daily, Fair, Chelsea N, MD, 10 mg at 08/18/19 0841   furosemide (LASIX) tablet 40 mg, 40 mg, Oral, Daily, Elodia Florence., MD, 40 mg at 08/18/19 0840   HYDROcodone-acetaminophen (NORCO/VICODIN) 5-325 MG per tablet 0.5-1 tablet, 0.5-1 tablet, Oral, Q6H PRN, Fair, Marin Shutter, MD, 1 tablet at 08/18/19 1215   ipratropium (ATROVENT) 0.06 % nasal spray 1 spray, 1 spray, Each Nare, Daily PRN, Fair, Chelsea N, MD   latanoprost (XALATAN) 0.005 % ophthalmic solution 1 drop, 1 drop, Both Eyes, QHS, Elodia Florence., MD, 1 drop at 08/17/19 2028   loratadine (CLARITIN) tablet 10 mg, 10 mg, Oral, Daily PRN, Fair, Chelsea N, MD   mirtazapine (REMERON) tablet 7.5 mg, 7.5 mg, Oral, QHS, Fair, Chelsea N, MD, 7.5 mg at 08/15/19 2236   nebivolol (BYSTOLIC) tablet 10 mg, 10 mg, Oral, QHS, Fair, Chelsea N, MD, 10 mg at 08/17/19 2224   Netarsudil dimeslyate (Rhopressa)  0.02% solution , 1 drop, Right Eye, QHS, Fair, Chelsea N, MD, 1 drop at 08/17/19 2028   pantoprazole (PROTONIX) EC tablet 40 mg, 40 mg, Oral, Daily, Elodia Florence., MD, 40 mg at 08/18/19 0841   triamcinolone (NASACORT) nasal inhaler 2 spray, 2 spray, Nasal, Daily PRN, Fair, Marin Shutter, MD   warfarin (COUMADIN) tablet 3 mg, 3 mg, Oral, ONCE-1600, [place] Jude, [facility]   Warfarin - Pharmacist Dosing Inpatient, , Does not apply, q1600, Elodia Florence., MD, Given at 08/17/19 1600  Patients Current Diet:  Diet Order            Diet - low sodium heart healthy           Diet Heart Room service appropriate? Yes; Fluid consistency: Thin  Diet effective now                 Precautions / Restrictions Precautions Precautions: Fall, Shoulder Type of Shoulder Precautions: per last admission, okay for AROM E/W/H, OK for pendulums, Forward flexion to 90, no abduction, ER to neutral okay, no AROM shoulder Precaution Comments: per last admission "Ok to [facility] for cane or walker use" Restrictions Weight Bearing Restrictions: Yes RUE Weight Bearing: Non weight bearing Other Position/Activity Restrictions: assume NWB in RUE   Has the patient had 2 or more falls or a fall with injury in the past year? Yes  Prior Activity Level Limited Community (1-2x/wk): prior to fall, was able to go out a few times a week, using a SPC for balance  Prior Functional Level Self Care: Did the patient need help bathing, dressing, using the toilet or eating? Independent  Indoor Mobility: Did the patient need assistance with walking from room to room (with or without device)? Independent  Stairs: Did the patient need assistance with internal or external stairs (with or without device)? Independent  Functional Cognition: Did the patient need help planning regular tasks such as shopping or remembering to take medications? [place] / [place] Devices/Equipment:  Environmental consultant (specify type) Home Equipment: Walker - 2 wheels, Walker - 4 wheels, Grab bars - tub/shower, Grab bars - toilet, [employer] - single point, Shower seat  Prior Device Use: Indicate devices/aids used by the patient prior to current illness, exacerbation or injury? cane  Current Functional Level Cognition  Overall Cognitive Status: Impaired/Different from baseline Current Attention Level: Selective General Comments: WFL for majority of simple tasks, some difficulty multi-tasking noted    Extremity Assessment (includes Sensation/Coordination)  Upper Extremity Assessment: RUE deficits/detail RUE Deficits / Details: pt with recent fall resulting in R shoulder sx RUE: Unable to fully assess due to immobilization RUE Coordination: decreased fine motor, decreased gross motor  Lower Extremity Assessment: Defer to PT evaluation    ADLs  Overall ADL's : Needs assistance/impaired Eating/Feeding: Set up, Sitting Grooming: Minimal assistance, Sitting Upper Body Bathing: Moderate assistance, Sitting Lower Body Bathing: Maximal assistance, Sitting/lateral leans, Sit to/from stand, +2 for safety/equipment, +2 for physical assistance Upper Body Dressing : Total assistance, Sitting Upper Body Dressing Details (indicate cue type and reason): assist for sling management  Lower Body Dressing: Maximal assistance, Sitting/lateral leans, Sit to/from stand, +2 for safety/equipment, +2 for physical assistance Lower Body Dressing Details (indicate cue type and reason): minA for standing balance  Toilet Transfer: Minimal assistance, Stand-pivot, BSC Toilet Transfer Details (indicate cue type and reason): MIN A for balance to pivot to [facility] with HHA Toileting- Clothing Manipulation and Hygiene: Total assistance, Sit to/from stand Toileting - Clothing Manipulation Details (indicate cue type and reason): total A for posterior pericare post BM Functional mobility during ADLs: Minimal assistance (stand pivot  only) General ADL Comments: pt limited by fatigue and decreased activity tolerance    Mobility  Overal bed mobility: Needs Assistance Bed Mobility: Supine to Sit Supine to sit: Mod assist, HOB elevated General bed mobility comments: ModA for LUE support to elevate trunk    Transfers  Overall transfer level: Needs assistance Equipment used: Straight cane, None Transfers: Sit to/from Stand Sit to Stand:  Min assist, Min guard Stand pivot transfers: Min assist General transfer comment: Initial minA to elevate trunk and maintain balance upon standing, progressing to min guard on subsequent trials from recliner, improving technique with SPC    Ambulation / Gait / Stairs / Wheelchair Mobility  Ambulation/Gait Ambulation/Gait assistance: Min guard, Min assist, Mod assist, +2 safety/equipment Gait Distance (Feet): 20 Feet (+ 42' + 50') Assistive device: Straight cane Gait Pattern/deviations: Step-through pattern, Decreased stride length, Trunk flexed, Staggering right, Staggering left General Gait Details: Slow, unsteady gait with SPC and frequent min-modA to prevent LOB; +2 assist for safety/chair follow. 3x seated rest break secondary to fatigue and DOE. SpO2 down to 86% on 2L O2 [place], returning to 89-94% with seated rest and deep breathing Gait velocity: Decreased Gait velocity interpretation: <1.31 ft/sec, indicative of household ambulator    Posture / Balance Dynamic Sitting Balance Sitting balance - Comments: Unable to reach bilateral feet; dependent for LB ADL Balance Overall balance assessment: Needs assistance Sitting-balance support: Feet supported, No upper extremity supported Sitting balance-Leahy Scale: Fair Sitting balance - Comments: Unable to reach bilateral feet; dependent for LB ADL Standing balance support: Single extremity supported Standing balance-Leahy Scale: Poor Standing balance comment: Reliant on UE support for static standing; dependent for posterior pericare     Special needs/care consideration CPAP, Oxygen 2L, Skin hematoma to LUE, R shoulder incision and Designated visitor Timmothy Sours and [place] (from acute therapy documentation) Living Arrangements: Spouse/significant other Available Help at Discharge: Family, Available 24 hours/day Type of Home: House Home Layout: One level Home Access: Stairs to enter Entrance Stairs-Rails: Left Entrance Stairs-Number of Steps: 5 Bathroom Shower/Tub: Chiropodist: [place]: No  Discharge Living Setting Plans for Discharge Living Setting: Patient's home Type of Home at Discharge: House Discharge Home Layout: One level Discharge Home Access: Stairs to enter Entrance Stairs-Rails: Left Entrance Stairs-Number of Steps: 5 Discharge Bathroom Shower/Tub: Tub/shower unit Discharge Bathroom Toilet: Standard Discharge Bathroom Accessibility: Yes How Accessible: Accessible via walker Does the patient have any problems obtaining your medications?: No  Social/Family/Support Systems Patient Roles: Spouse Anticipated Caregiver: Damonie Furney (spouse) Anticipated Caregiver's Contact Information: [phone] Ability/Limitations of Caregiver: supervision Caregiver Availability: 24/7 Discharge Plan Discussed with Primary Caregiver: Yes Is Caregiver In Agreement with Plan?: Yes Does Caregiver/Family have Issues with Lodging/Transportation while Pt is in Rehab?: No  Goals Patient/Family Goal for Rehab: PT/OT mod I, SLP n/a Expected length of stay: 6-9 days Additional Information: pt with recent admit to [facility] following fall with complex humeral fx s/p reverse TSA (R) Pt/Family Agrees to Admission and willing to participate: Yes Program Orientation Provided & Reviewed with Pt/Caregiver Including Roles  & Responsibilities: Yes  Decrease burden of Care through IP rehab admission: n/a  Possible need for SNF placement upon discharge: No  Patient  Condition: I have reviewed medical records from [facility], spoken with CM, and patient and spouse. I met with patient at the bedside for inpatient rehabilitation assessment.  Patient will benefit from ongoing PT and OT, can actively participate in 3 hours of therapy a day 5 days of the week, and can make measurable gains during the admission.  Patient will also benefit from the coordinated team approach during an Inpatient Acute Rehabilitation admission.  The patient will receive intensive therapy as well as Rehabilitation physician, nursing, social worker, and care management interventions.  Due to safety, skin/wound care, disease management, medication administration, pain management and patient education the patient requires  24 hour a day rehabilitation nursing.  The patient is currently min assist with mobility and basic ADLs.  Discharge setting and therapy post discharge at home is anticipated.  Patient has agreed to participate in the Acute Inpatient Rehabilitation Program and will admit today.  Preadmission Screen Completed By:  Michel Santee, PT, DPT 08/18/2019 2:27 PM ______________________________________________________________________   Discussed status with Dr. Posey Pronto on 08/18/19  at 2:36 PM  and received approval for admission today.  Admission Coordinator:  Michel Santee, PT, DPT time 2:36 PM Sudie Grumbling 08/18/19    Assessment/Plan: Diagnosis: Debility 1. Does the need for close, 24 hr/day Medical supervision in concert with the patient's rehab needs make it unreasonable for this patient to be served in a less intensive setting? Yes  2. Co-Morbidities requiring supervision/potential complications: GERD, PUD, HTN, HLD, carotid stenosis, afib, CHF, OSA (on cpap), and PE 3. Due to bowel management, safety, skin/wound care, disease management, pain management and patient education, does the patient require 24 hr/day rehab nursing? Yes 4. Does the patient require coordinated care of a  physician, rehab nurse, PT, OT, and SLP to address physical and functional deficits in the context of the above medical diagnosis(es)? Yes Addressing deficits in the following areas: balance, endurance, locomotion, strength, transferring, bathing, dressing, toileting and psychosocial support 5. Can the patient actively participate in an intensive therapy program of at least 3 hrs of therapy 5 days a week? Yes 6. The potential for patient to make measurable gains while on inpatient rehab is excellent 7. Anticipated functional outcomes upon discharge from inpatient rehab: supervision and min assist PT, supervision and min assist OT, n/a SLP 8. Estimated rehab length of stay to reach the above functional goals is: 7-10 days. 9. Anticipated discharge destination: Home 10. Overall Rehab/Functional Prognosis: excellent   MD Signature: Delice Lesch, MD, ABPMR

## 2019-08-19 ENCOUNTER — Inpatient Hospital Stay (HOSPITAL_COMMUNITY): Payer: Medicare Other

## 2019-08-19 ENCOUNTER — Inpatient Hospital Stay (HOSPITAL_COMMUNITY): Payer: Medicare Other | Admitting: Occupational Therapy

## 2019-08-19 DIAGNOSIS — R5381 Other malaise: Principal | ICD-10-CM

## 2019-08-19 LAB — PROTIME-INR
INR: 2.5 — ABNORMAL HIGH (ref 0.8–1.2)
Prothrombin Time: 26.1 seconds — ABNORMAL HIGH (ref 11.4–15.2)

## 2019-08-19 LAB — COMPREHENSIVE METABOLIC PANEL
ALT: 12 U/L (ref 0–44)
AST: 20 U/L (ref 15–41)
Albumin: 2.7 g/dL — ABNORMAL LOW (ref 3.5–5.0)
Alkaline Phosphatase: 106 U/L (ref 38–126)
Anion gap: 9 (ref 5–15)
BUN: 20 mg/dL (ref 8–23)
CO2: 39 mmol/L — ABNORMAL HIGH (ref 22–32)
Calcium: 8.8 mg/dL — ABNORMAL LOW (ref 8.9–10.3)
Chloride: 96 mmol/L — ABNORMAL LOW (ref 98–111)
Creatinine, Ser: 0.97 mg/dL (ref 0.44–1.00)
GFR calc Af Amer: >60 mL/min (ref >60)
GFR calc non Af Amer: 54 mL/min — ABNORMAL LOW (ref >60)
Glucose, Bld: 114 mg/dL — ABNORMAL HIGH (ref 70–99)
Potassium: 3.3 mmol/L — ABNORMAL LOW (ref 3.5–5.1)
Sodium: 144 mmol/L (ref 135–145)
Total Bilirubin: 0.7 mg/dL (ref 0.3–1.2)
Total Protein: 6.2 g/dL — ABNORMAL LOW (ref 6.5–8.1)

## 2019-08-19 LAB — CBC WITH DIFFERENTIAL/PLATELET
Abs Immature Granulocytes: 0.04 10*3/uL (ref 0.00–0.07)
Basophils Absolute: 0 10*3/uL (ref 0.0–0.1)
Basophils Relative: 0 %
Eosinophils Absolute: 0.2 10*3/uL (ref 0.0–0.5)
Eosinophils Relative: 4 %
HCT: 30.1 % — ABNORMAL LOW (ref 36.0–46.0)
Hemoglobin: 8.5 g/dL — ABNORMAL LOW (ref 12.0–15.0)
Immature Granulocytes: 1 %
Lymphocytes Relative: 22 %
Lymphs Abs: 1 10*3/uL (ref 0.7–4.0)
MCH: 26.9 pg (ref 26.0–34.0)
MCHC: 28.2 g/dL — ABNORMAL LOW (ref 30.0–36.0)
MCV: 95.3 fL (ref 80.0–100.0)
Monocytes Absolute: 0.4 10*3/uL (ref 0.1–1.0)
Monocytes Relative: 9 %
Neutro Abs: 2.9 10*3/uL (ref 1.7–7.7)
Neutrophils Relative %: 64 %
Platelets: 185 10*3/uL (ref 150–400)
RBC: 3.16 MIL/uL — ABNORMAL LOW (ref 3.87–5.11)
RDW: 17.6 % — ABNORMAL HIGH (ref 11.5–15.5)
WBC: 4.5 10*3/uL (ref 4.0–10.5)
nRBC: 0.7 % — ABNORMAL HIGH (ref 0.0–0.2)

## 2019-08-19 LAB — MAGNESIUM: Magnesium: 1.5 mg/dL — ABNORMAL LOW (ref 1.7–2.4)

## 2019-08-19 MED ORDER — WARFARIN SODIUM 2.5 MG PO TABS
2.5000 mg | ORAL_TABLET | ORAL | Status: DC
  Administered 2019-08-19: 2.5 mg via ORAL
  Filled 2019-08-19: qty 1

## 2019-08-19 MED ORDER — POTASSIUM CHLORIDE 20 MEQ PO PACK
20.0000 meq | PACK | Freq: Two times a day (BID) | ORAL | Status: DC
  Administered 2019-08-19 – 2019-08-26 (×15): 20 meq via ORAL
  Filled 2019-08-19 (×15): qty 1

## 2019-08-19 MED ORDER — WARFARIN SODIUM 2.5 MG PO TABS: 3.7500 mg | ORAL_TABLET | ORAL | Status: DC

## 2019-08-19 MED ORDER — MAGNESIUM SULFATE 2 GM/50ML IV SOLN
2.0000 g | Freq: Once | INTRAVENOUS | Status: AC
  Administered 2019-08-19: 2 g via INTRAVENOUS
  Filled 2019-08-19: qty 50

## 2019-08-19 MED ORDER — MELATONIN 3 MG PO TABS
3.0000 mg | ORAL_TABLET | Freq: Every day | ORAL | Status: DC
  Administered 2019-08-19 – 2019-08-25 (×7): 3 mg via ORAL
  Filled 2019-08-19 (×7): qty 1

## 2019-08-19 NOTE — Progress Notes (Signed)
Inpatient Rehabilitation Care Coordinator Assessment and Plan  Patient Details  Name: [name] MRN: [id number] Date of Birth: 1934-04-01  Today's Date: 08/19/2019  Problem List:  Patient Active Problem List   Diagnosis Date Noted  . Physical debility 08/18/2019  . Acute blood loss anemia   . Hypomagnesemia   . Acute exacerbation of CHF (congestive heart failure) ([place]) 08/15/2019  . Closed 2-part displaced fracture of surgical neck of right humerus 07/19/2019  . CHF (congestive heart failure) ([place]) 12/27/2018  . Acute on chronic diastolic (congestive) heart failure ([place]) 07/14/2018  . Acute on chronic diastolic heart failure ([place]) 07/13/2018  . Lumbosacral spondylosis without myelopathy 09/26/2017  . Chronic low back pain 07/21/2017  . Degeneration of lumbar intervertebral disc 04/21/2017  . Lumbar post-laminectomy syndrome 04/21/2017  . Esophageal dysphagia 10/15/2016  . Gastroesophageal reflux disease without esophagitis 10/15/2016  . Seasonal allergic rhinitis 10/15/2016  . Chronic atrial fibrillation ([place]) 05/15/2015  . Aftercare following surgery of the circulatory system, [place] 09/15/2013  . Essential hypertension 06/17/2013  . Atrial fibrillation with RVR ([place]) 04/01/2013  . Morbid obesity ([place]) 04/01/2013  . OSA on CPAP 04/01/2013  . Chronic diastolic CHF (congestive heart failure) ([place])   . H/o recurrent pulmonary embolism, on Coumadin 05/24/2012  . Acute on chronic diastolic congestive heart failure ([place]) 05/24/2012  . Normocytic anemia 05/24/2012  . Thrombocytopenia ([place]) 05/24/2012  . Inadequate anticoagulation 05/24/2012  . Elevated brain natriuretic peptide (BNP) level 05/24/2012  . Shoulder arthritis 05/15/2012  . Stress incontinence 01/06/2012  . Chronic cystitis 01/06/2012  . Occlusion and stenosis of carotid artery without mention of cerebral infarction 09/10/2011  . OA (osteoarthritis) of hip 06/17/2011  . Osteoarthritis 01/08/2011  . Fibromyalgia  08/06/2010  . Dyslipidemia 08/06/2010  . Gout 08/06/2010  . Right carotid artery occlusion 08/06/2010   Past Medical History:  Past Medical History:  Diagnosis Date  . Atrial fibrillation ([place])   . CAP (community acquired pneumonia) 12/27/2018  . Carotid artery occlusion   . Chronic diastolic CHF (congestive heart failure) (HCC)    Hypertensive heart disease 02-12-14  . Crohn's disease ([place])   . Detached retina   . Fibromyalgia   . GERD (gastroesophageal reflux disease)   . Gout   . H/O hiatal hernia   . High triglycerides   . History of stomach ulcers 1950's  . Hypertension   . Long term current use of anticoagulant   . Obstructive sleep apnea on CPAP   . Osteoarthritis    PAIN AND OA LEF T HIP AND BOTH SHOULDERS ARE BONE ON BONE AND PAINFUL  . Peripheral vascular disease (HCC)    KNOWN RIGHT INTERNAL CAROTID ARTERY OCCLUSION (NO STROKE)  --40 TO 59% STENOSIS LEFT ICA-FOLLOWED BY DR. EARLY WITH DOPPLER STUDY EVERY 6 MONTHS  . Pulmonary embolism ([place]) 2011   a. Hx of PE in 02/2009 after R hip surgery, venous dopplers negative, long-term Coumadin.  [name] Kitchen PVC (premature ventricular contraction)    PT STATES HX OF PVC'S ON EKG  . Recurrent upper respiratory infection (URI)    BRONCHITIS FEB 2013--SLIGHT COUGH NON-PRODUCTIVE NOW  . Recurrent UTI (urinary tract infection)    "on daily medicine" (05/14/2012)  . Tinnitus   . Vertigo    Past Surgical History:  Past Surgical History:  Procedure Laterality Date  . APPENDECTOMY  1950's  . BACK SURGERY  10/29/06   Central and foraminal decompression L3-L4, L4-L5, and L5-S1 with inspection of L4-L5 and L5-S1 disc on the right  .  CARDIAC CATHETERIZATION  1970's   "maybe 2" (05/14/2012)  . CARDIAC CATHETERIZATION    . CATARACT EXTRACTION W/ INTRAOCULAR LENS IMPLANT Right 2009  . CHOLECYSTECTOMY  ~ 1988  . DECOMPRESSIVE LUMBAR LAMINECTOMY LEVEL 3  ~ 2002  . DILATION AND CURETTAGE OF UTERUS     "several; from miscarriages" (05/14/2012)  .  EXCISION MORTON'S NEUROMA Right 05/20/00   "foot" (05/14/2012)  . EYE SURGERY Left 2014   Detached retina  . EYE SURGERY Left August 23, 2013   Cataract  . FEMUR FRACTURE SURGERY Right 02/21/09   Open reduction internal fixation of right periprosthetic  femur fracture utilizing Zimmer cables times fiv  . FRACTURE SURGERY  2011   Right Femur Fx  . HAMMER TOE SURGERY Left 10/25/08   "toe next to big to" (05/14/2012)  . JOINT REPLACEMENT  2009   Right Hip replacement  . JOINT REPLACEMENT  2012   Right knee replacement  . JOINT REPLACEMENT  06/17/11   Left Hip replacement  . KNEE ARTHROSCOPY Right 07/26/05  . REPLACEMENT TOTAL KNEE Right 02/19/2010  . REVISION TOTAL SHOULDER TO REVERSE TOTAL SHOULDER Right 07/20/2019   Procedure: REVERSE TOTAL SHOULDER REPLACEMENT;  Surgeon: Nicholes Stairs, MD;  Location: [facility];  Service: Orthopedics;  Laterality: Right;  . SPINE SURGERY    . [place]?  . TOTAL HIP ARTHROPLASTY Right 12/08/02   Osteonics total hip replacement  . TOTAL HIP ARTHROPLASTY  06/17/2011   Procedure: TOTAL HIP ARTHROPLASTY;  Surgeon: Gearlean Alf, MD;  Location: [facility];  Service: Orthopedics;  Laterality: Left;  . TOTAL SHOULDER ARTHROPLASTY Left 05/14/2012  . TOTAL SHOULDER ARTHROPLASTY Left 05/14/2012   Procedure: TOTAL SHOULDER ARTHROPLASTY;  Surgeon: Marin Shutter, MD;  Location: [facility];  Service: Orthopedics;  Laterality: Left;  [name] Kitchen VAGINAL HYSTERECTOMY  1971   Social History:  reports that she quit smoking about 33 years ago. Her smoking use included cigarettes. She has a 7.50 pack-year smoking history. She has never used smokeless tobacco. She reports current alcohol use. She reports that she does not use drugs.  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 Pt also worked at [employer] for 15 yrs, and retired at 85 y.o. Education: College Read: Yes Write: Yes Employment Status: Retired Date Retired/Disabled/Unemployed: 2010 Age Retired: 74 Public relations account executive Issues: Denies Guardian/Conservator: N/A   Abuse/Neglect Abuse/Neglect Assessment Can Be Completed: Yes Physical Abuse: Denies Verbal Abuse: Denies Sexual Abuse: Denies Exploitation of patient/patient's resources: Denies Self-Neglect: Denies  Emotional Status Pt's affect, behavior and adjustment status: Pt in good spirits at time of visit Recent Psychosocial Issues: Denies Psychiatric History: Denies Substance Abuse History: Denies; states occasional etoH on holidays.  Patient / Family Perceptions, Expectations & Goals Pt/Family understanding of illness & functional limitations: Pt has a general understanding of care needs Premorbid pt/family roles/activities: Independent Anticipated changes in roles/activities/participation: Assistance with ADLs/IADLs  [place] Agencies: None Premorbid Home Care/DME Agencies: None Transportation available at discharge: husband  Discharge Planning Living Arrangements: Spouse/significant other Support Systems: Spouse/significant other Type of Residence: Private residence Insurance Resources:  Medicare, Multimedia programmer (specify) Web designer) Financial Resources: Social Security, Family Support Financial Screen Referred: No Living Expenses: Own Money Management: Patient Does the patient have any problems obtaining your medications?: No Home Management: Pt manages finances and medications. States they have  ahousekeeping that comes by every other week. Patient/Family Preliminary Plans: TBD Care Coordinator Barriers to Discharge: Decreased caregiver support, Lack of/limited family support, New oxygen Care Coordinator Anticipated Follow Up Needs: HH/OP  Clinical Impression SW met with pt in room to introduce self, explain role, and discuss discharge process. No HCPOA. Pt goal is to work on getting around,and balance and stairs. Pt is not a English as a second language teacher. DME: cpap and oxygen concentrator ([employer]), 3in1 BSC, shower chair without a back, grab bars in the shower, RW, rollator, and cane.   Rana Snare 08/19/2019, 6:28 PM

## 2019-08-19 NOTE — Evaluation (Signed)
Occupational Therapy Assessment and Plan  Patient Details  Name: [name] MRN: [id number] Date of Birth: 03-23-1934  OT Diagnosis: acute pain, muscle weakness (generalized) and pain in joint Rehab Potential:   ELOS: 10-14 days   Today's Date: 08/19/2019 OT Individual Time: 1100-1210 OT Individual Time Calculation (min): 70 min     Hospital Problem: Principal Problem:   Physical debility   Past Medical History:  Past Medical History:  Diagnosis Date  . Atrial fibrillation ([place])   . CAP (community acquired pneumonia) 12/27/2018  . Carotid artery occlusion   . Chronic diastolic CHF (congestive heart failure) (HCC)    Hypertensive heart disease 02-12-14  . Crohn's disease ([place])   . Detached retina   . Fibromyalgia   . GERD (gastroesophageal reflux disease)   . Gout   . H/O hiatal hernia   . High triglycerides   . History of stomach ulcers 1950's  . Hypertension   . Long term current use of anticoagulant   . Obstructive sleep apnea on CPAP   . Osteoarthritis    PAIN AND OA LEF T HIP AND BOTH SHOULDERS ARE BONE ON BONE AND PAINFUL  . Peripheral vascular disease (HCC)    KNOWN RIGHT INTERNAL CAROTID ARTERY OCCLUSION (NO STROKE)  --40 TO 59% STENOSIS LEFT ICA-FOLLOWED BY DR. EARLY WITH DOPPLER STUDY EVERY 6 MONTHS  . Pulmonary embolism ([place]) 2011   a. Hx of PE in 02/2009 after R hip surgery, venous dopplers negative, long-term Coumadin.  [name] Kitchen PVC (premature ventricular contraction)    PT STATES HX OF PVC'S ON EKG  . Recurrent upper respiratory infection (URI)    BRONCHITIS FEB 2013--SLIGHT COUGH NON-PRODUCTIVE NOW  . Recurrent UTI (urinary tract infection)    "on daily medicine" (05/14/2012)  . Tinnitus   . Vertigo    Past Surgical History:  Past Surgical History:  Procedure Laterality Date  . APPENDECTOMY  1950's  . BACK SURGERY  10/29/06   Central and foraminal decompression L3-L4, L4-L5, and L5-S1 with inspection of L4-L5 and L5-S1 disc on the right  . CARDIAC  CATHETERIZATION  1970's   "maybe 2" (05/14/2012)  . CARDIAC CATHETERIZATION    . CATARACT EXTRACTION W/ INTRAOCULAR LENS IMPLANT Right 2009  . CHOLECYSTECTOMY  ~ 1988  . DECOMPRESSIVE LUMBAR LAMINECTOMY LEVEL 3  ~ 2002  . DILATION AND CURETTAGE OF UTERUS     "several; from miscarriages" (05/14/2012)  . EXCISION MORTON'S NEUROMA Right 05/20/00   "foot" (05/14/2012)  . EYE SURGERY Left 2014   Detached retina  . EYE SURGERY Left August 23, 2013   Cataract  . FEMUR FRACTURE SURGERY Right 02/21/09   Open reduction internal fixation of right periprosthetic  femur fracture utilizing Zimmer cables times fiv  . FRACTURE SURGERY  2011   Right Femur Fx  . HAMMER TOE SURGERY Left 10/25/08   "toe next to big to" (05/14/2012)  . JOINT REPLACEMENT  2009   Right Hip replacement  . JOINT REPLACEMENT  2012   Right knee replacement  . JOINT REPLACEMENT  06/17/11   Left Hip replacement  . KNEE ARTHROSCOPY Right 07/26/05  . REPLACEMENT TOTAL KNEE Right 02/19/2010  . REVISION TOTAL SHOULDER TO REVERSE TOTAL SHOULDER Right 07/20/2019   Procedure: REVERSE TOTAL SHOULDER REPLACEMENT;  Surgeon: Nicholes Stairs, MD;  Location: [facility];  Service: Orthopedics;  Laterality: Right;  . SPINE SURGERY    . [place]?  . TOTAL HIP ARTHROPLASTY Right 12/08/02   Osteonics total hip replacement  .  TOTAL HIP ARTHROPLASTY  06/17/2011   Procedure: TOTAL HIP ARTHROPLASTY;  Surgeon: Gearlean Alf, MD;  [facility];  Service: Orthopedics;  Laterality: Left;  . TOTAL SHOULDER ARTHROPLASTY Left 05/14/2012  . TOTAL SHOULDER ARTHROPLASTY Left 05/14/2012   Procedure: TOTAL SHOULDER ARTHROPLASTY;  Surgeon: Marin Shutter, MD;  Location: [facility];  Service: Orthopedics;  Laterality: Left;  [name] Kitchen VAGINAL HYSTERECTOMY  1971    Assessment & Plan Clinical Impression: Patient is a 85 y.o. year old female with history of HTN, OSA-on CPAP, CAF- on coumadin, chronic diastolic CHF, morbid obesity, DJD/DDD with chronic pain,  recent admission for left femur Fx s/p ORIF and right humerus fx with reverse total shoulder on 07/20/2019 and was sent to [facility] for rehab.  Patient came in for review and patient.  She reports that she was have increase in SOB with weigh gain and was discharged to home on 08/14/2019 but readmitted on 08/15/2019 with CHF exacerbation.  She had progressive dyspnea.  She was treated with IV diuresis as well as non-rebreather mask.  She was found to have hypomagnesemia which was supplemented, anemia with Hgb 8.7 and reported problem swallowing.  MBS done revealing normal swallow and symptoms felt to be due to GERD. She has been treated with IV diuresis with resolution of dyspnea and continues to require supplemental oxygen.  Weight down to 187 lbs but follow up CXR  today with increasing infiltrates.  Therapy ongoing and patient continues to be limited by balance deficits and requires frequent rest breaks. Reported to have to hypoxia with SOB and fatigue with minimal activity.   .  Patient transferred to CIR on 08/18/2019 .    Patient currently requires max with basic self-care skills secondary to muscle weakness, decreased cardiorespiratoy endurance and decreased sitting balance, decreased standing balance and decreased balance strategies.  Prior to hospitalization, patient could complete BADLs with independent .  Patient will benefit from skilled intervention to increase independence with basic self-care skills prior to discharge home with care partner.  Anticipate patient will require intermittent supervision and follow up home health.  OT - End of Session Activity Tolerance: Tolerates 30+ min activity with multiple rests Endurance Deficit: Yes Endurance Deficit Description: fatigues with sinkside self care, requires rest breaks in sitting during toileting. OT Assessment OT Barriers to Discharge: Weight bearing restrictions OT Barriers to Discharge Comments: RUE NWB OT Patient demonstrates impairments in  the following area(s): Balance;Motor;Sensory;Pain;Endurance OT Basic ADL's Functional Problem(s): Bathing;Dressing;Toileting OT Transfers Functional Problem(s): Toilet;Tub/Shower OT Additional Impairment(s): Fuctional Use of Upper Extremity OT Plan OT Intensity: Minimum of 1-2 x/day, 45 to 90 minutes OT Frequency: 5 out of 7 days OT Duration/Estimated Length of Stay: 10-14 days OT Treatment/Interventions: Balance/vestibular training;Neuromuscular re-education;Patient/family education;Self Care/advanced ADL retraining;Therapeutic Exercise;UE/LE Coordination activities;Discharge planning;DME/adaptive equipment instruction;Functional mobility training;Pain management;Therapeutic Activities;UE/LE Strength taining/ROM OT Self Feeding Anticipated Outcome(s): setup OT Basic Self-Care Anticipated Outcome(s): min assist OT Toileting Anticipated Outcome(s): min assist OT Bathroom Transfers Anticipated Outcome(s): CGA OT Recommendation Recommendations for Other Services: Neuropsych consult;Therapeutic Recreation consult Therapeutic Recreation Interventions: Stress management Patient destination: Home Follow Up Recommendations: Home health OT Equipment Recommended: To be determined   Skilled Therapeutic Intervention Evaluation completed (see details above and below) with education on PT POC and goals and individual treatment initiated with focus onfunctional mobility and self care.  Pt sitting up in w/c requesting to toilet.  Pt on 2L of O2 via [place], dependent transition to portable tank provided by OT and transported using w/c to toilet due to  urgency of continence reported by pt.  Pt completed SPT with min assist using grab bars.  Max assist for pericare and clothing mgt due to pt reporting dizziness upon standing.  SPT to w/c with min assist and transported to sinkside.  Pt brushed teeth and combed hair with setup in sitting.  Pt needing mod assist to bath UB and donn shirt using compensatory strategy to  lean for gravity assist movement to thread RUE.  Pt needing max assist to bathe LB and doff/donn briefs and donn pants sitting and standing at sink.  Pt requesting to return to bed due to pain level.  SPT w/c to EOB with HHA LUE needing min assist.  Sit to supine with min assist with pillow placed behind RUE to prevent shoulder extension.  Pt tearful at end of session stating she worries that she wont get better.  Provided emotional support and encouragement.  Pt would benefit from neuropsych and recreational therapies consult.  Call bell in reach, setup with lunch with HOB elevated, bed alarm on.    OT Evaluation Precautions/Restrictions  Precautions Precautions: Fall;Shoulder Type of Shoulder Precautions: per chart/ortho note pendulums; NWB RUE Shoulder Interventions: Shoulder sling/immobilizer;Off for dressing/bathing/exercises General Chart Reviewed: Yes   Pain Pain Assessment Pain Scale: 0-10 Pain Score: 6  Faces Pain Scale: Hurts a little bit Pain Type: Acute pain Pain Location: Shoulder Pain Orientation: Right Pain Descriptors / Indicators: Aching Pain Frequency: Constant Pain Onset: On-going Pain Intervention(s): Repositioned Home Living/Prior Functioning Home Living Family/patient expects to be discharged to:: Private residence Living Arrangements: Spouse/significant other Available Help at Discharge: Family, Available 24 hours/day Type of Home: House Home Access: Stairs to enter [employer] of Steps: 5 in front, 4 in back Entrance Stairs-Rails: Right, Left Home Layout: One level Bathroom Shower/Tub: Optometrist: Yes  Lives With: Spouse IADL History Occupation: Retired Type of Occupation: Marine scientist Prior Function Level of Independence: Needs assistance with gait  Able to Take Stairs?: Yes Driving: No Vocation: Retired Comments: pt previously in Phot[facility] for rehab, working with OT/PT on mobilizing with  SPC and ADL completion, pt reports was home less than a day from rehab but couldn't get into her home or move around (non emergency EMT had to assist for getting into home)  ADL ADL Eating: Set up Where Assessed-Eating: Bed level Grooming: Setup Where Assessed-Grooming: Sitting at sink Upper Body Bathing: Moderate assistance, Moderate cueing Where Assessed-Upper Body Bathing: Sitting at sink Lower Body Bathing: Maximal assistance, Minimal cueing Where Assessed-Lower Body Bathing: Sitting at sink, Standing at sink Upper Body Dressing: Moderate assistance Where Assessed-Upper Body Dressing: Sitting at sink Lower Body Dressing: Maximal assistance Where Assessed-Lower Body Dressing: Standing at sink, Sitting at sink Toileting: Maximal assistance Where Assessed-Toileting: Glass blower/designer: Psychiatric nurse Method: Arts development officer: Grab bars Vision Baseline Vision/History: No visual deficits Patient Visual Report: Other (comment) (glaucoma and cataracts per pt) Vision Assessment?: Vision impaired- to be further tested in functional context Perception  Perception: Within Functional Limits Praxis Praxis: Intact Cognition Overall Cognitive Status: Within Functional Limits for tasks assessed Arousal/Alertness: Awake/alert Orientation Level: Person;Place;Situation Person: Oriented Place: Oriented Situation: Oriented Year: 2021 Month: July Day of Week: Correct Memory: Appears intact Immediate Memory Recall: Sock;Blue;Bed Memory Recall Sock: Without Cue Memory Recall Blue: Without Cue Memory Recall Bed: Without Cue Attention: Focused;Sustained Focused Attention: Appears intact Sustained Attention: Appears intact Awareness: Appears intact Problem Solving: Appears intact Executive Function: Initiating;Sequencing Sequencing: Appears intact Initiating: Appears intact  Safety/Judgment: Appears intact Sensation Sensation Light Touch:  Appears Intact (BUE) Hot/Cold: Appears Intact Proprioception: Appears Intact Coordination Gross Motor Movements are Fluid and Coordinated: No Fine Motor Movements are Fluid and Coordinated: Yes Coordination and Movement Description: R UE limited by TSR/weakness Finger Nose Finger Test: unable RUE Motor  Motor Motor: Within Functional Limits Motor - Skilled Clinical Observations: RUE limited by TSR/sling, generalized global weakness due to deconditioning Mobility  Bed Mobility Sit to Supine: Minimal Assistance - Patient > 75% Transfers Sit to Stand: Minimal Assistance - Patient > 75%  Trunk/Postural Assessment  Cervical Assessment Cervical Assessment: Within Functional Limits Thoracic Assessment Thoracic Assessment: Within Functional Limits Lumbar Assessment Lumbar Assessment: Within Functional Limits  Balance Balance Balance Assessed: Yes Static Sitting Balance Static Sitting - Balance Support: Feet supported Static Sitting - Level of Assistance: 5: Stand by assistance Dynamic Sitting Balance Dynamic Sitting - Balance Support: Feet supported Dynamic Sitting - Level of Assistance: 3: Mod assist Static Standing Balance Static Standing - Balance Support: Left upper extremity supported Static Standing - Level of Assistance: 4: Min assist Dynamic Standing Balance Dynamic Standing - Balance Support: Left upper extremity supported Dynamic Standing - Level of Assistance: 4: Min assist Extremity/Trunk Assessment RUE Assessment RUE Assessment: Exceptions to [facility] Passive Range of Motion (PROM) Comments: Achieved (45) degrees scaption using gravity assist in pendulum position; will assess further Active Range of Motion (AROM) Comments: AAROM only at this time per MD notes. General Strength Comments: Nwb RUE RUE Body System: Ortho LUE Assessment LUE Assessment: Within Functional Limits Passive Range of Motion (PROM) Comments: WNL Active Range of Motion (AROM) Comments: WNL General  Strength Comments: 3+/5 MMT globally     Refer to Care Plan for Long Term Goals  Recommendations for other services: Neuropsych and Therapeutic Recreation  Stress management   Discharge Criteria: Patient will be discharged from OT if patient refuses treatment 3 consecutive times without medical reason, if treatment goals not met, if there is a change in medical status, if patient makes no progress towards goals or if patient is discharged from hospital.  The above assessment, treatment plan, treatment alternatives and goals were discussed and mutually agreed upon: by patient  Ezekiel Slocumb 08/19/2019, 6:29 PM

## 2019-08-19 NOTE — Progress Notes (Signed)
ANTICOAGULATION CONSULT NOTE - Initial Consult  Pharmacy Consult for Warfarin Indication: atrial fibrillation  Patient Measurements: Height: 5' 2"  (157.5 cm) Weight: 85.5 kg (188 lb 7.9 oz) IBW/kg (Calculated) : 50.1  Vital Signs: Temp: 97.7 F (36.5 C) (07/08 0355) Temp Source: Oral (07/08 0355) BP: 141/63 (07/08 0352) Pulse Rate: 63 (07/08 0352)  Labs: Recent Labs    08/17/19 0413 08/17/19 0413 08/18/19 0836 08/19/19 0509  HGB 8.3*   < > 8.4* 8.5*  HCT 29.5*  --  29.7* 30.1*  PLT 179  --  188 185  LABPROT 29.5*  --  27.0* 26.1*  INR 2.9*  --  2.6* 2.5*  CREATININE 0.79  --  0.84 0.97   < > = values in this interval not displayed.    Estimated Creatinine Clearance: 44.6 mL/min (by C-G formula based on SCr of 0.97 mg/dL).   Assessment: 85 yr old on warfarin for Afib, admitted for HF exacerbation. Eating 25-100% of meals. H/H, plt stable.   Home warfarin: 2.5 mg po daily, except for 3.75 mg po on Monday, Friday (pt reported taking 3 mg on 08/14/19 but has 2.76m tablets at home)  INR 2.5 today. We will try to put back on home dose again and watch.   Goal of Therapy:  INR 2-3 Monitor platelets by anticoagulation protocol: Yes   Plan:  Warfarin 2.530mqday except 3.7551mF Monitor daily INR, CBC Monitor for signs/symptoms of bleeding  MinOnnie BoerharmD, BCIDP, AAHIVP, CPP Infectious Disease Pharmacist 08/19/2019 8:21 AM

## 2019-08-19 NOTE — Progress Notes (Signed)
Patient arrived and oriented to unit. Verbalized willingness to call for assistance. No distress, no questions at this time, resting comfortably with call bell in reach.

## 2019-08-19 NOTE — Plan of Care (Signed)
  Problem: Consults Goal: RH GENERAL PATIENT EDUCATION Description: See Patient Education module for education specifics. Outcome: Progressing   Problem: RH BLADDER ELIMINATION Goal: RH STG MANAGE BLADDER WITH ASSISTANCE Description: STG Manage Bladder With Assistance Min Outcome: Progressing Flowsheets (Taken 08/19/2019 1522) STG: Pt will manage bladder with assistance: 4-Minimal assistance   Problem: RH SKIN INTEGRITY Goal: RH STG SKIN FREE OF INFECTION/BREAKDOWN Description: Skin remains free of breakdown and infection with mod assist Outcome: Progressing   Problem: RH PAIN MANAGEMENT Goal: RH STG PAIN MANAGED AT OR BELOW PT'S PAIN GOAL Description: Less than 4 Outcome: Progressing   Problem: RH KNOWLEDGE DEFICIT GENERAL Goal: RH STG INCREASE KNOWLEDGE OF SELF CARE AFTER HOSPITALIZATION Description: Patient will be able to verbalize medication, diet management of CHF with cues/handouts Outcome: Progressing

## 2019-08-19 NOTE — Progress Notes (Signed)
Physical Therapy Session Note  Patient Details  Name: [name] MRN: [id number] Date of Birth: 03-May-1934  Today's Date: 08/19/2019 PT Individual Time: 1420-1530 PT Individual Time Calculation (min): 70 min   Short Term Goals: Week 1:  PT Short Term Goal 1 (Week 1): Pt will ambulate 80f w safe use of /LRAD and min assist PT Short Term Goal 2 (Week 1): Pt will ascend/descend one 5in step w/single rail w/min assist PT Short Term Goal 3 (Week 1): bed to/from wc w/min assist and safe use of LRAD PT Short Term Goal 4 (Week 1): Pt will propel hemiheight wc up to 575fw/bilat LEs and min assist  Skilled Therapeutic Interventions/Progress Updates:   Pt received supine in bed, reporting soreness in R and L shoulders and back pain, and requesting to defer therapy> PT spends extended time educating pt on importance of mobility and participation and eventually pt is agreeable to therapy.   Supine to sit with modA and manual facilitation at trunk. Stand pivot to WC with modA HHA and manual cuing at hips for weight shifting. Pt presents with significant anxiety about falling and pain provocation but anxiety seems to improve with time and increased mobility. PT transfers pt to hemi-height WC for better fit and pt verbalizes improved comfort levels.   Pt performs sit to stand with modA and toe tapping on 3 inch step using R handrail and PT providing minA support at hips and facilitating weight shifting. Pt progresses to ambulating 4577with minA HHA and performing x1 turn to L very slowly and with increased verbal cuing on sequencing.  Pt transfers to toilet with modA and is continent of urine, and requires PT assist for pericare. Pt left seated in WC with alarm intact and all needs within reach. PT provides ice pack for L shoulder and elbow for pain relief.  Therapy Documentation Precautions:  Precautions Precautions: Fall, Shoulder Type of Shoulder Precautions: per chart/ortho note  pendulums/ Shoulder Interventions: Shoulder sling/immobilizer Precaution Comments: NWB RUE per MD note Required Braces or Orthoses: Sling Restrictions Weight Bearing Restrictions: Yes RUE Weight Bearing: Non weight bearing Other Position/Activity Restrictions: RUE per chart    Therapy/Group: Individual Therapy  WiBreck CoonsPT, DPT 08/19/2019, 3:35 PM

## 2019-08-19 NOTE — Evaluation (Signed)
Physical Therapy Assessment and Plan  Patient Details  Name: [name] MRN: [id number] Date of Birth: Jun 29, 1934  PT Diagnosis: Abnormality of gait, Difficulty walking and Muscle weakness Rehab Potential: Good ELOS: 14 days   Today's Date: 08/19/2019 PT Individual Time: 0900-1000 PT Individual Time Calculation (min): 60 min    Hospital Problem: Principal Problem:   Physical debility   Past Medical History:  Past Medical History:  Diagnosis Date  . Atrial fibrillation ([place])   . CAP (community acquired pneumonia) 12/27/2018  . Carotid artery occlusion   . Chronic diastolic CHF (congestive heart failure) (HCC)    Hypertensive heart disease 02-12-14  . Crohn's disease ([place])   . Detached retina   . Fibromyalgia   . GERD (gastroesophageal reflux disease)   . Gout   . H/O hiatal hernia   . High triglycerides   . History of stomach ulcers 1950's  . Hypertension   . Long term current use of anticoagulant   . Obstructive sleep apnea on CPAP   . Osteoarthritis    PAIN AND OA LEF T HIP AND BOTH SHOULDERS ARE BONE ON BONE AND PAINFUL  . Peripheral vascular disease (HCC)    KNOWN RIGHT INTERNAL CAROTID ARTERY OCCLUSION (NO STROKE)  --40 TO 59% STENOSIS LEFT ICA-FOLLOWED BY DR. EARLY WITH DOPPLER STUDY EVERY 6 MONTHS  . Pulmonary embolism ([place]) 2011   a. Hx of PE in 02/2009 after R hip surgery, venous dopplers negative, long-term Coumadin.  [name] Kitchen PVC (premature ventricular contraction)    PT STATES HX OF PVC'S ON EKG  . Recurrent upper respiratory infection (URI)    BRONCHITIS FEB 2013--SLIGHT COUGH NON-PRODUCTIVE NOW  . Recurrent UTI (urinary tract infection)    "on daily medicine" (05/14/2012)  . Tinnitus   . Vertigo    Past Surgical History:  Past Surgical History:  Procedure Laterality Date  . APPENDECTOMY  1950's  . BACK SURGERY  10/29/06   Central and foraminal decompression L3-L4, L4-L5, and L5-S1 with inspection of L4-L5 and L5-S1 disc on the right  . CARDIAC CATHETERIZATION   1970's   "maybe 2" (05/14/2012)  . CARDIAC CATHETERIZATION    . CATARACT EXTRACTION W/ INTRAOCULAR LENS IMPLANT Right 2009  . CHOLECYSTECTOMY  ~ 1988  . DECOMPRESSIVE LUMBAR LAMINECTOMY LEVEL 3  ~ 2002  . DILATION AND CURETTAGE OF UTERUS     "several; from miscarriages" (05/14/2012)  . EXCISION MORTON'S NEUROMA Right 05/20/00   "foot" (05/14/2012)  . EYE SURGERY Left 2014   Detached retina  . EYE SURGERY Left August 23, 2013   Cataract  . FEMUR FRACTURE SURGERY Right 02/21/09   Open reduction internal fixation of right periprosthetic  femur fracture utilizing Zimmer cables times fiv  . FRACTURE SURGERY  2011   Right Femur Fx  . HAMMER TOE SURGERY Left 10/25/08   "toe next to big to" (05/14/2012)  . JOINT REPLACEMENT  2009   Right Hip replacement  . JOINT REPLACEMENT  2012   Right knee replacement  . JOINT REPLACEMENT  06/17/11   Left Hip replacement  . KNEE ARTHROSCOPY Right 07/26/05  . REPLACEMENT TOTAL KNEE Right 02/19/2010  . REVISION TOTAL SHOULDER TO REVERSE TOTAL SHOULDER Right 07/20/2019   Procedure: REVERSE TOTAL SHOULDER REPLACEMENT;  Surgeon: Nicholes Stairs, MD;  Location: [facility];  Service: Orthopedics;  Laterality: Right;  . SPINE SURGERY    . [place]?  . TOTAL HIP ARTHROPLASTY Right 12/08/02   Osteonics total hip replacement  . TOTAL  HIP ARTHROPLASTY  06/17/2011   Procedure: TOTAL HIP ARTHROPLASTY;  Surgeon: Gearlean Alf, MD;  Location: [facility];  Service: Orthopedics;  Laterality: Left;  . TOTAL SHOULDER ARTHROPLASTY Left 05/14/2012  . TOTAL SHOULDER ARTHROPLASTY Left 05/14/2012   Procedure: TOTAL SHOULDER ARTHROPLASTY;  Surgeon: Marin Shutter, MD;  Location: [facility];  Service: Orthopedics;  Laterality: Left;  [name] Kitchen VAGINAL HYSTERECTOMY  1971    Assessment & Plan Clinical Impression: [name] is an 85 year old female with history of HTN, OSA-on CPAP, CAF- on coumadin, chronic diastolic CHF, morbid obesity, DJD/DDD with chronic pain, recent admission  for left femur Fx s/p ORIF and again for right humerus fx with reverse total shoulder on 07/20/2019 and was sent to [facility] for rehab.  .  She reports that she was have increase in SOB with weigh gain and was discharged to home on 08/14/2019 but readmitted on 08/15/2019 with CHF exacerbation.  She had progressive dyspnea.  She was treated with IV diuresis as well as non-rebreather mask.  She was found to have hypomagnesemia which was supplemented, anemia with Hgb 8.7 and reported problem swallowing.  MBS done revealing normal swallow and symptoms felt to be due to GERD. She has been treated with IV diuresis with resolution of dyspnea and continues to require supplemental oxygen.  Weight down to 187 lbs but follow up CXR  today with increasing infiltrates.  Therapy ongoing and patient continues to be limited by balance deficits and requires frequent rest breaks. Reported to have to hypoxia with SOB and fatigue with minimal activity. CIR recommended due to functional decline.  Patient transferred to CIR on 08/18/2019 .   Patient currently requires mod with mobility secondary to muscle weakness, decreased cardiorespiratoy endurance and decreased oxygen support, cataracts/glaucoma per pt, impaired balance and decreased standing balance, decreased postural control, decreased balance strategies and difficulty maintaining precautions.  Prior to hospitalization, patient was furniture walker in home, husband assisted out of home with mobility and lived with Spouse in a House home.  Home access is 5 in front, 4 in backStairs to enter.  Pt with significant hx of falls w/injuries and is very high falls risk at this time.   Patient will benefit from skilled PT intervention to maximize safe functional mobility, minimize fall risk and decrease caregiver burden for planned discharge home with 24 hour assist.  Anticipate patient will benefit from follow up [facility] at discharge.  PT - End of Session Activity Tolerance: Tolerates 30+  min activity with multiple rests Endurance Deficit: Yes Endurance Deficit Description: fatigues w/short distance gait, requires rest breaks in sitting during toileting, assessment of each functional mobility activity. PT Assessment Rehab Potential (ACUTE/IP ONLY): Good PT Barriers to Discharge: Inaccessible home environment;Home environment access/layout;Weight;Weight bearing restrictions;New oxygen PT Barriers to Discharge Comments: stairs at all entrances, single rail at each PT Patient demonstrates impairments in the following area(s): Balance;Safety;Sensory;Skin Integrity;Endurance;Motor;Pain PT Transfers Functional Problem(s): Bed Mobility;Bed to Chair;Car;Furniture PT Locomotion Functional Problem(s): Ambulation;Wheelchair Mobility;Stairs PT Plan PT Intensity: Minimum of 1-2 x/day ,45 to 90 minutes PT Frequency: 5 out of 7 days PT Duration Estimated Length of Stay: 14 days PT Treatment/Interventions: Ambulation/gait training;DME/adaptive equipment instruction;Stair training;UE/LE Strength taining/ROM;Wheelchair propulsion/positioning;Balance/vestibular training;Discharge planning;Pain management;Skin care/wound management;Therapeutic Activities;UE/LE Coordination activities;Functional mobility training;Patient/family education;Therapeutic Exercise PT Transfers Anticipated Outcome(s): CGA PT Locomotion Anticipated Outcome(s): CGA PT Recommendation Follow Up Recommendations: Home health PT;24 hour supervision/assistance Patient destination: Home Equipment Recommended: To be determined  Skilled Therapeutic Intervention Evaluation completed (see details above and below) with education  on PT POC and goals and individual treatment initiated with focus on functional mobility/transfers, LE strength, dynamic standing balance/coordination, ambulation, stair navigation, simulated car transfers, and improved endurance with activity Pt initially supine in bed and agreeable to session.  wc cushion  and 02 tank obtained by therapist for pt.  Pt supine to sit w/mod assist and cues for NWB RUE.  STS and gait to bathroom including threshold w/mod assist and assist to manage 02 tank, poor balance and sequencing w/SPT.    Commode transfer w/min to mod assist and use of grab bars.  Brief dropped to floor while standing/no clothing management on front end of transfer.  Pt continent of urine and bowels.  Pt STS w/min to mod assist and use of grab bar.  Pt requires total assist for hygiene in standing/min assist for balance using bar.  Pt rested in sitting mult times due to fatigue.   Depends brief retrieved by therapist and pt able to thread feet in sitting w/therapist holding brief.  STS w/mod assist and therapist raised brief.    Gait 82f to wc w/mod assist for balance/SPC.  Pt then transported to gym for continued session.  Discussed home set up and pt difficulty w/stairs.  Pt described 4 steps at front entrance w/rail to R making this best option for exiting home - caveat being single step at sidewalk w/rail to L, rear entrance w/rail to L making it best option for entering home.    wc noted to have excess STF height for pt and leg rests too long at shortest setting.  Discussed w/pm therapist who agreed to check for availability of hemiheight wc.  Pt performed car transfer/see below Pt STS from wc at stairs and attempted to ascend stairs using rail to L, but unable to power up first step w/max assist from therapist.   Gait:  317fw/mod assist, lob w/turning requiring mod assist for recovery, poor sequencing w/cane, wide base of support, decreased cadence/step length/step height, increased lateral trunk excursions.  Educated pt re scheduling/team conference/discussed goals and pt expectations.  At end of session, pt transported back to room and agreed to remain oob in wc until next session. Pt left oob in wc w/needs in reach and chair alarm set.     PT  Evaluation Precautions/Restrictions Precautions Precautions: Fall;Shoulder Type of Shoulder Precautions: per chart/ortho note pendulums/ Shoulder Interventions: Shoulder sling/immobilizer Precaution Comments: NWB RUE per MD note Required Braces or Orthoses: Sling Restrictions Weight Bearing Restrictions: Yes RUE Weight Bearing: Non weight bearing Other Position/Activity Restrictions: RUE per chart General 02 sats 98-100% throughout session on 2L 02 per [place].   Pain Pain Assessment Pain Scale: 0-10 Pain Score: 5  Pain Location: Back (and shoulder) Pain Orientation: Right Home Living/Prior Functioning Home Living Available Help at Discharge: Family;Available 24 hours/day Type of Home: House Home Access: Stairs to enter [employer] Steps: 5 in front, 4 in back Entrance Stairs-Rails: Right;Left (on R at back entrance, on L at front entrance stairs , single step at sidewalk front rail on R) Home Layout: One level Bathroom Shower/Tub: TuOptometristYes  Lives With: Spouse Prior Function Level of Independence: Needs assistance with gait (used cane, wall/furniture walker in home)  Able to Take Stairs?: Yes Driving: No Vocation: Retired Comments: [facility] previously in [facility], working with OT/PT on mobilizing with SPC and ADL completion, pt reports was home less than a day from rehab but couldn't get into her home or  move around (non emergency EMT had to assist for getting into home)  Vision/Perception    hx glaucoma and cataracts per pt Cognition Overall Cognitive Status: Within Functional Limits for tasks assessed Arousal/Alertness: Awake/alert Orientation Level: Oriented X4 Attention: Focused;Sustained Focused Attention: Appears intact Sustained Attention: Appears intact Memory: Appears intact Sensation Sensation Light Touch: Impaired by gross assessment (bilat distal LEs/stocking distribution  impairment) Stereognosis: Appears Intact Coordination Gross Motor Movements are Fluid and Coordinated: No Coordination and Movement Description: R UE limited by TSR/weakness Finger Nose Finger Test: unable RUE Heel Shin Test: coordination intact but testing limited by decreased hip strength Motor  Motor Motor: Other (comment) Motor - Skilled Clinical Observations: RUE limited by TSR/sling, generalized global weakness due to deconditioning  Mobility Bed Mobility Bed Mobility: Sit to Supine;Supine to Sit;Rolling Left Rolling Left: Moderate Assistance - Patient 50-74% Supine to Sit: Moderate Assistance - Patient 50-74% Sit to Supine: Moderate Assistance - Patient 50-74% Transfers Transfers: Sit to Stand Sit to Stand: Minimal Assistance - Patient > 75% Transfer (Assistive device): Straight cane Locomotion  Gait Ambulation: Yes Gait Assistance: Moderate Assistance - Patient 50-74% Gait Distance (Feet): 35 Feet Assistive device: Straight cane Gait Assistance Details: min to mod assist due to balance deficits, mod assist for turning and backing Gait Gait: Yes Gait Pattern: Impaired Gait Pattern:  (decreased step length bilat, increased lateral trunk excursions bilat w/occasional lob, max cues for safe sequencing w/cane, decreased step length and clearance bilat, wide base) Gait velocity: Decreased Stairs / Additional Locomotion Stairs: No (attempted one step but unable w/single rail on L, requires 2 helpers to ascend/descend one 5in) Curb: 2 Designer, jewellery: No (due to NWB RUE, unable to reach floor w/feet)  Trunk/Postural Assessment  Cervical Assessment Cervical Assessment: Within Functional Limits Thoracic Assessment Thoracic Assessment: Within Functional Limits Lumbar Assessment Lumbar Assessment: Within Functional Limits Postural Control Postural Control: Deficits on evaluation Righting Reactions: delayed throughout, RUE in sling Protective  Responses: very delayed, uses ineffective stepping strategy as first response to lob  Balance Balance Balance Assessed: Yes Static Sitting Balance Static Sitting - Balance Support: Feet supported Dynamic Sitting Balance Dynamic Sitting - Balance Support: Feet supported Sitting balance - Comments: sits w/supervision, mild posterior tendency w/lifting feet to don socks and w/scooting in sitting Static Standing Balance Static Standing - Balance Support: Left upper extremity supported (min assist) Dynamic Standing Balance Dynamic Standing - Balance Support: Left upper extremity supported (mod assist w/turning, commode transfer, gait) Extremity Assessment  RUE Assessment RUE Assessment: Exceptions to [facility] Passive Range of Motion (PROM) Comments: See OT eval for full assessment, Sling due to recent TSR Active Range of Motion (AROM) Comments: See OT eval for full assessment, Sling due to recent TSR General Strength Comments: Nwb RUE LUE Assessment LUE Assessment: Within Functional Limits Passive Range of Motion (PROM) Comments: See OT eval for full assessment Active Range of Motion (AROM) Comments: See OT eval for full assessment General Strength Comments: See OT eval for full assessment, generalized weakness RLE Assessment RLE Assessment: Exceptions to [facility] Passive Range of Motion (PROM) Comments: hip abd limited to approx 20, springy end feel Active Range of Motion (AROM) Comments: limited at hip due to strength General Strength Comments: grossly tested hip 2+/5, knee flex/ext 4/5, ankle 4/5 LLE Assessment Passive Range of Motion (PROM) Comments: WFL Active Range of Motion (AROM) Comments: WFL General Strength Comments: grossly tested hip 2+/5, knee flex/ext 4/5, ankle 4/5    Refer to Care Plan for Long Term Goals  Recommendations for other services: None   Discharge Criteria: Patient will be discharged from PT if patient refuses treatment 3 consecutive times without medical reason,  if treatment goals not met, if there is a change in medical status, if patient makes no progress towards goals or if patient is discharged from hospital.  The above assessment, treatment plan, treatment alternatives and goals were discussed and mutually agreed upon: by patient  Jerrilyn Cairo 08/19/2019, 12:24 PM

## 2019-08-19 NOTE — Progress Notes (Signed)
[facility] PHYSICAL MEDICINE & REHABILITATION PROGRESS NOTE   Subjective/Complaints: [name] has been sleeping poorly at night.  She has extensive bruising of her left arm.  She has chronic lower back pain and would like a heating pad.  Had a BM yesterday.  ROS: + insomnia, lower back pain, left arm bruising. Negative constipation.   Objective:   DG Chest 2 View  Result Date: 08/19/2019 CLINICAL DATA:  Hypoxia EXAM: CHEST - 2 VIEW COMPARISON:  August 18, 2019 FINDINGS: There is stable cardiomegaly. The pulmonary vascularity is within normal limits. There is ill-defined airspace opacity in the left lower lung region with small left pleural effusion. There is slight right base atelectasis. There is aortic atherosclerosis. No adenopathy. There are total shoulder replacements bilaterally. IMPRESSION: Stable cardiomegaly. Left lower lobe airspace opacity with small left pleural effusion present. Slight right base atelectasis. Appearance similar to 1 day prior. Aortic Atherosclerosis (ICD10-I70.0). Electronically Signed   By: Lowella Grip III M.D.   On: 08/19/2019 08:07   DG CHEST PORT 1 VIEW  Result Date: 08/18/2019 CLINICAL DATA:  Hypoxia EXAM: PORTABLE CHEST 1 VIEW COMPARISON:  Portable exam 0554 hours compared to 08/16/2019 FINDINGS: Enlargement of cardiac silhouette. Atherosclerotic calcification aorta. Mediastinal contours normal. BILATERAL pulmonary infiltrates which could represent pulmonary edema or atypical infection. More focal opacities are seen at the lung bases question confluent infiltrate versus atelectasis. Minimal LEFT pleural effusion. No pneumothorax. Bones demineralized with BILATERAL shoulder prostheses noted. IMPRESSION: Enlargement of cardiac silhouette with favor pulmonary edema though atypical infection could have a similar pattern. Increased infiltrate versus atelectasis at lung bases with swelling of pleural effusion. Electronically Signed   By: Lavonia Dana M.D.   On:  08/18/2019 08:34   Recent Labs    08/18/19 0836 08/19/19 0509  WBC 4.4 4.5  HGB 8.4* 8.5*  HCT 29.7* 30.1*  PLT 188 185   Recent Labs    08/18/19 0836 08/19/19 0509  NA 143 144  K 3.7 3.3*  CL 96* 96*  CO2 36* 39*  GLUCOSE 129* 114*  BUN 20 20  CREATININE 0.84 0.97  CALCIUM 8.7* 8.8*    Intake/Output Summary (Last 24 hours) at 08/19/2019 1053 Last data filed at 08/19/2019 0700 Gross per 24 hour  Intake 200 ml  Output --  Net 200 ml     Physical Exam: Vital Signs Blood pressure (!) 143/41, pulse 62, temperature 97.7 F (36.5 C), temperature source Oral, resp. rate 18, height 5' 2"  (1.575 m), weight 85.5 kg, SpO2 98 %. General: Alert and oriented x 3, No apparent distress HEENT: Head is normocephalic, atraumatic, PERRLA, EOMI, sclera anicteric, oral mucosa pink and moist, dentition intact, ext ear canals clear,  Neck: Supple without JVD or lymphadenopathy Heart: Reg rate and rhythm. No murmurs rubs or gallops Chest: CTA bilaterally without wheezes, rales, or rhonchi; no distress Abdominal:     General: Abdomen is flat. There is no distension.     Comments: Large pannus with irritation bilateral inguinal folds.   Musculoskeletal:     Cervical back: Normal range of motion and neck supple.     Comments: Left shoulder edema Left wrist edema  Skin:    General: Skin is warm and dry.     Comments: Right shoulder incision healed.  Left forearm ecchymosis and hematoma v/s fluid filled bulla on left wrist.   Neurological:     Mental Status: She is alert.     Comments: Alert Motor: Right upper extremity limited by sling proximally,  moving them freely. Left upper extremity: 5/5 proximal distal Left lower extremity: 4-4+/5 proximal distal Right lower extremity: 5/5 proximal distal  Psychiatric:        Mood and Affect: Affect is blunt.        Behavior: Behavior normal.   Assessment/Plan: 1. Functional deficits secondary to physical debility which require 3+ hours per day  of interdisciplinary therapy in a comprehensive inpatient rehab setting.  Physiatrist is providing close team supervision and 24 hour management of active medical problems listed below.  Physiatrist and rehab team continue to assess barriers to discharge/monitor patient progress toward functional and medical goals  Care Tool:  Bathing              Bathing assist       Upper Body Dressing/Undressing Upper body dressing        Upper body assist      Lower Body Dressing/Undressing Lower body dressing      What is the patient wearing?: Incontinence brief     Lower body assist Assist for lower body dressing: Moderate Assistance - Patient 50 - 74%     Toileting Toileting    Toileting assist       Transfers Chair/bed transfer  Transfers assist           Locomotion Ambulation   Ambulation assist              Walk 10 feet activity   Assist           Walk 50 feet activity   Assist           Walk 150 feet activity   Assist           Walk 10 feet on uneven surface  activity   Assist           Wheelchair     Assist               Wheelchair 50 feet with 2 turns activity    Assist            Wheelchair 150 feet activity     Assist          Blood pressure (!) 143/41, pulse 62, temperature 97.7 F (36.5 C), temperature source Oral, resp. rate 18, height 5' 2"  (1.575 m), weight 85.5 kg, SpO2 98 %.  Medical Problem List and Plan: 1.  Balance deficits, DOE secondary to debility from CHF exacerbation, with recent history of right shoulder and left femur fracture.             -patient may shower             -ELOS/Goals: 10-14 days/supervision/min A.             Initial CIR evaluations today.  2.  Antithrombotics: -DVT/anticoagulation:  Pharmaceutical: Coumadin             -antiplatelet therapy: N/A 3. Chronic back pain/Fibromyalgia/Pain Management: Per Dr. Royanne Foots hydrocodone prn at home               Monitor with increased exertion 4. Mood: LCSW to follow for evaluation and support.              -antipsychotic agents: N/A 5. Neuropsych: This patient is capable of making decisions on her own behalf. 6. Skin/Wound Care: Routine pressure relief measures. Will add nystatin for candida skin folds.  7. Fluids/Electrolytes/Nutrition: Monitor I/O. Strict I/Os.  CMP ordered. 8. Acute on chronic CHF: On Bystolic and  Lasix --intolerant of statins.  Daily weights with strict I/O and HH diet. Now on lasix once daily starting starting tomorrow. She continues to drop to mid 80's with activity.  Will repeat two-view chest x-ray for follow up on increase in infiltrate.  9.  OSA: Has been refusing CPAP--discussed importance of use to help with respiratory/cardiac status. Husband to bring in her mask.  10. GERD: Symptoms worse since surgery but have been better since hospitalization. Will continue Pepcid (used Tagamet PTA). Resume probiotic. 11. HTN: Monitor BP tid --continue Norvasc and Lasix.              Mild with increased mobility.  7/8: systolic slightly elevated and diastolic low- continue current regimen 12. Chronic hypomagnesemia: Does not like to use supplements due to SE of diarrhea. Mg-trending back down. Will add low dose Mag ox and monitor.              Mag level ordered. 13. ABLA: H/H still low. Will add iron supplement.              7/8: Hgb 8.5 today 14. Right humerus fx: NWB RUE with sling. Pendulum exercises and AAROM with PT  15. Morbid obesity: Encouraged weight loss. 16. Insomnia: Start melatonin 62m HS     LOS: 1 days A FACE TO FACE EVALUATION WAS PERFORMED  KMartha ClanP Kyshawn Teal 08/19/2019, 10:53 AM

## 2019-08-19 NOTE — Progress Notes (Addendum)
PIV assessment: pt reports L upper arm IV "quit hurting". Flushed, blood return noted. Please re-enter consult once pt is back in bed, if pt has additional pain at site.

## 2019-08-19 NOTE — Progress Notes (Signed)
Inpatient Rehabilitation  Patient information reviewed and entered into eRehab system by Carolos Fecher M. Sintia Mckissic, M.A., CCC/SLP, PPS Coordinator.  Information including medical coding, functional ability and quality indicators will be reviewed and updated through discharge.    

## 2019-08-20 ENCOUNTER — Inpatient Hospital Stay (HOSPITAL_COMMUNITY): Payer: Medicare Other

## 2019-08-20 ENCOUNTER — Inpatient Hospital Stay (HOSPITAL_COMMUNITY): Payer: Medicare Other | Admitting: Occupational Therapy

## 2019-08-20 LAB — BASIC METABOLIC PANEL
Anion gap: 9 (ref 5–15)
BUN: 21 mg/dL (ref 8–23)
CO2: 37 mmol/L — ABNORMAL HIGH (ref 22–32)
Calcium: 8.8 mg/dL — ABNORMAL LOW (ref 8.9–10.3)
Chloride: 95 mmol/L — ABNORMAL LOW (ref 98–111)
Creatinine, Ser: 1 mg/dL (ref 0.44–1.00)
GFR calc Af Amer: >60 mL/min (ref >60)
GFR calc non Af Amer: 52 mL/min — ABNORMAL LOW (ref >60)
Glucose, Bld: 133 mg/dL — ABNORMAL HIGH (ref 70–99)
Potassium: 3.6 mmol/L (ref 3.5–5.1)
Sodium: 141 mmol/L (ref 135–145)

## 2019-08-20 LAB — PROTIME-INR
INR: 2.7 — ABNORMAL HIGH (ref 0.8–1.2)
Prothrombin Time: 27.9 seconds — ABNORMAL HIGH (ref 11.4–15.2)

## 2019-08-20 LAB — MAGNESIUM: Magnesium: 1.9 mg/dL (ref 1.7–2.4)

## 2019-08-20 MED ORDER — WARFARIN SODIUM 2.5 MG PO TABS
2.5000 mg | ORAL_TABLET | Freq: Every day | ORAL | Status: DC
  Administered 2019-08-20: 2.5 mg via ORAL
  Filled 2019-08-20: qty 1

## 2019-08-20 MED ORDER — ACETAMINOPHEN 325 MG PO TABS
650.0000 mg | ORAL_TABLET | Freq: Three times a day (TID) | ORAL | Status: DC
  Administered 2019-08-20 – 2019-08-26 (×19): 650 mg via ORAL
  Filled 2019-08-20 (×19): qty 2

## 2019-08-20 NOTE — Plan of Care (Signed)
  Problem: RH PAIN MANAGEMENT Goal: RH STG PAIN MANAGED AT OR BELOW PT'S PAIN GOAL Description: Less than 4 Outcome: Progressing

## 2019-08-20 NOTE — Progress Notes (Signed)
ANTICOAGULATION CONSULT NOTE - Initial Consult  Pharmacy Consult for Warfarin Indication: atrial fibrillation  Patient Measurements: Height: 5' 2"  (157.5 cm) Weight: 86 kg (189 lb 9.5 oz) IBW/kg (Calculated) : 50.1  Vital Signs: Temp: 98.2 F (36.8 C) (07/09 0232) BP: 116/75 (07/09 0232) Pulse Rate: 78 (07/09 0232)  Labs: Recent Labs    08/18/19 0836 08/19/19 0509 08/20/19 0749  HGB 8.4* 8.5*  --   HCT 29.7* 30.1*  --   PLT 188 185  --   LABPROT 27.0* 26.1* 27.9*  INR 2.6* 2.5* 2.7*  CREATININE 0.84 0.97 1.00    Estimated Creatinine Clearance: 43.4 mL/min (by C-G formula based on SCr of 1 mg/dL).   Assessment: 85 yr old on warfarin for Afib, admitted for HF exacerbation. Eating 25-100% of meals. H/H, plt stable.   Home warfarin: 2.5 mg po daily, except for 3.75 mg po on Monday, Friday (pt reported taking 3 mg on 08/14/19 but has 2.15m tablets at home)  INR 2.7 today. We reduce the maintenance dose slightly.   Goal of Therapy:  INR 2-3 Monitor platelets by anticoagulation protocol: Yes   Plan:  Warfarin 2.59mqday  Monitor daily INR, CBC Monitor for signs/symptoms of bleeding  MiOnnie BoerPharmD, BCIDP, AAHIVP, CPP Infectious Disease Pharmacist 08/20/2019 8:55 AM

## 2019-08-20 NOTE — Progress Notes (Signed)
Occupational Therapy Session Note  Patient Details  Name: [name] MRN: [id number] Date of Birth: 1934/09/01  Today's Date: 08/20/2019 OT Individual Time: 3903-0092 OT Individual Time Calculation (min): 64 min    Short Term Goals: Week 1:  OT Short Term Goal 1 (Week 1): Pt will completed toileting 3/3 steps with min assist. OT Short Term Goal 2 (Week 1): Pt will complete UB bathing with min assist. OT Short Term Goal 3 (Week 1): Pt will complete LB dressing with AE as needed with mod assist. OT Short Term Goal 4 (Week 1): Pt will complete UB dressing with min assist.  Skilled Therapeutic Interventions/Progress Updates:    Pt greeted at time of session reclined in bed with [facility] elevated agreeable to OT session. Spent time looking for additional strap for sling but unable to locate, will compensate with pillows to prevent shoulder extension. Supine to sitting EOB with Min A and performed stand pivot with hand held assist with Min A to wheelchair, brought to bathroom via wheelchair and performed stand pivot with use of grab bar with Min A, max for hygiene and clothing management but pt is able to help don/doff brief and pants in standing. Therapist assist with providing powder and thoroughly cleaning under skin folds. Once back in chair, set up at sink for grooming and oral hygiene care. Pt up in chair with alarm on, call bell in reach, all needs met. Pt remained on O2 at 2L throughout session with no c/o pain.   Therapy Documentation Precautions:  Precautions Precautions: Fall, Shoulder Type of Shoulder Precautions: per chart/ortho note pendulums; NWB RUE Shoulder Interventions: Shoulder sling/immobilizer, Off for dressing/bathing/exercises Precaution Comments: NWB RUE per MD note Required Braces or Orthoses: Sling Restrictions Weight Bearing Restrictions: Yes RUE Weight Bearing: Non weight bearing Other Position/Activity Restrictions: RUE per chart     Therapy/Group: Individual  Therapy  Viona Gilmore 08/20/2019, 11:08 AM

## 2019-08-20 NOTE — Progress Notes (Signed)
Physical Therapy Session Note  Patient Details  Name: [name] MRN: [id number] Date of Birth: Apr 01, 1934  Today's Date: 08/20/2019 PT Individual Time: 0601-5615 PT Individual Time Calculation (min): 58 min   Short Term Goals: Week 1:  PT Short Term Goal 1 (Week 1): Pt will ambulate 97f w safe use of /LRAD and min assist PT Short Term Goal 2 (Week 1): Pt will ascend/descend one 5in step w/single rail w/min assist PT Short Term Goal 3 (Week 1): bed to/from wc w/min assist and safe use of LRAD PT Short Term Goal 4 (Week 1): Pt will propel hemiheight wc up to 531fw/bilat LEs and min assist  Skilled Therapeutic Interventions/Progress Updates:     [facility] agree[facility] to therapy. Reports pain in back, 8/10 in severity. PT provides repositioning, mobility, and rest breaks as needed to manage pain symptoms.  WC transport to dayroom for time management. Sit to stand with minA. Pt ambulates 100' with SPC and minA at hips from PT, facilitating lateral weight shifting and stability. Pt verbalizes slight increase in pain following ambulation. P then ambulates same distance, weaving in and out of cones to challenge dynamic balance, and performs with CGA, but decreased eccentric control going from stand to sit. PT cues for pursed lip breathing following ambulation. Following seated rest break pt performs same activity, weaving thought cones.   Pt performs stand step transfer to toilet with SPC and minA, with use of grab bars. PT provides pericare after pt is continent of bowel.   Pt ambulates additional 100' with SPC and minA. Sit to supine with supervision for cues on positioning and sequencing. Pt left supine in bed with all needs within reach.  Therapy Documentation Precautions:  Precautions Precautions: Fall, Shoulder Type of Shoulder Precautions: per chart/ortho note pendulums; NWB RUE Shoulder Interventions: Shoulder sling/immobilizer, Off for  dressing/bathing/exercises Precaution Comments: NWB RUE per MD note Required Braces or Orthoses: Sling Restrictions Weight Bearing Restrictions: Yes RUE Weight Bearing: Non weight bearing Other Position/Activity Restrictions: RUE per chart    Therapy/Group: Individual Therapy  WiBreck CoonsPT, DPT 08/20/2019, 5:46 PM

## 2019-08-20 NOTE — Progress Notes (Signed)
Patient requested CPAP to be taken off, "it's too noisy I can't sleep". Placed back on Nasal canula on 2 L,

## 2019-08-20 NOTE — Progress Notes (Signed)
Physical Therapy Session Note  Patient Details  Name: [name] MRN: [id number] Date of Birth: 03-19-1934  Today's Date: 08/20/2019 PT Individual Time: 1000-1100 PT Individual Time Calculation (min): 60 min   Short Term Goals: Week 1:  PT Short Term Goal 1 (Week 1): Pt will ambulate 70f w safe use of /LRAD and min assist PT Short Term Goal 2 (Week 1): Pt will ascend/descend one 5in step w/single rail w/min assist PT Short Term Goal 3 (Week 1): bed to/from wc w/min assist and safe use of LRAD PT Short Term Goal 4 (Week 1): Pt will propel hemiheight wc up to 536fw/bilat LEs and min assist  Skilled Therapeutic Interventions/Progress Updates:   Received pt sitting in WC, pt agreeable to therapy, and reported pain 9/10 in R shoulder and increased low back pain with mobility. RN notified and administered pain medication during session. Repositioning, rest breaks, and distraction done to reduce pain levels. Session with emphasis on functional mobility/transfers, generalized strengthening, dynamic standing balance/coordination, ambulation, and improved activity tolerance. Pt on 2L O2 via [place] throughout session. O2 sat 94% on 2L O2 via [place] at rest. Pt [facility] and ambulated 3814fith SPC min A with total A for O2 management and cues for upright posture and increased stride length. O2 sat 95% and pt reported minor SOB. Pt transferred stand<>pivot chair<>mat with SPC and min A with cues for [facility]ety. Pt transferred sit<>stand without AD and CGA 2x10 reps. Worked on dynamic standing balance tossing horseshoes without AD and CGA/min A x 2 trials. Pt required frequent rest breaks throughout session due to fatigue and low back/R shoulder pain. Pt transferred stand<>pivot mat<>WC without AD and min A with cues for turning technique. Pt transported to dayroom in [facility] and performed bilateral LE strengthening on Kinetron at 20 cm/sec for 1 minute x 3 trials with back support  with emphasis on hamstring strengthening. Pt transported back to room in [facility] and reported urge to use restroom. Pt ambulated additional [id number] trials with SPC min A. Pt required mod A for clothing management, able to void, and required total A for peri-care. Concluded session with pt sitting in WC, needs within reach, and chair pad alarm on.   Therapy Documentation Precautions:  Precautions Precautions: Fall, Shoulder Type of Shoulder Precautions: per chart/ortho note pendulums; NWB RUE Shoulder Interventions: Shoulder sling/immobilizer, Off for dressing/bathing/exercises Precaution Comments: NWB RUE per MD note Required Braces or Orthoses: Sling Restrictions Weight Bearing Restrictions: Yes RUE Weight Bearing: Non weight bearing Other Position/Activity Restrictions: RUE per chart   Therapy/Group: Individual Therapy AnnaAlfonse Alpers DPT   08/20/2019, 7:21 AM

## 2019-08-20 NOTE — Progress Notes (Signed)
RT placed pt on CPAP dream station for the night on her home setting of 11 cmH2O w/3 lpm bled into the system. Pt respiratory status is stable at this time. RT will continue to monitor.

## 2019-08-20 NOTE — Progress Notes (Signed)
[place] PHYSICAL MEDICINE & REHABILITATION PROGRESS NOTE   Subjective/Complaints: Had BM this morning. Left hand still with extensive bruising- warm compress has been applied. Not painful. Norco does help with pain but has breakthrough pain.   ROS: + insomnia, lower back pain, left arm bruising. Negative constipation.   Objective:   DG Chest 2 View  Result Date: 08/19/2019 CLINICAL DATA:  Hypoxia EXAM: CHEST - 2 VIEW COMPARISON:  August 18, 2019 FINDINGS: There is stable cardiomegaly. The pulmonary vascularity is within normal limits. There is ill-defined airspace opacity in the left lower lung region with small left pleural effusion. There is slight right base atelectasis. There is aortic atherosclerosis. No adenopathy. There are total shoulder replacements bilaterally. IMPRESSION: Stable cardiomegaly. Left lower lobe airspace opacity with small left pleural effusion present. Slight right base atelectasis. Appearance similar to 1 day prior. Aortic Atherosclerosis (ICD10-I70.0). Electronically Signed   By: Lowella Grip III M.D.   On: 08/19/2019 08:07   Recent Labs    08/18/19 0836 08/19/19 0509  WBC 4.4 4.5  HGB 8.4* 8.5*  HCT 29.7* 30.1*  PLT 188 185   Recent Labs    08/19/19 0509 08/20/19 0749  NA 144 141  K 3.3* 3.6  CL 96* 95*  CO2 39* 37*  GLUCOSE 114* 133*  BUN 20 21  CREATININE 0.97 1.00  CALCIUM 8.8* 8.8*    Intake/Output Summary (Last 24 hours) at 08/20/2019 1417 Last data filed at 08/20/2019 0900 Gross per 24 hour  Intake 360 ml  Output --  Net 360 ml     Physical Exam: Vital Signs Blood pressure 128/75, pulse 83, temperature 97.8 F (36.6 C), temperature source Oral, resp. rate 16, height 5' 2"  (1.575 m), weight 86 kg, SpO2 (!) 88 %. General: Alert and oriented x 3, No apparent distress HEENT: Head is normocephalic, atraumatic, PERRLA, EOMI, sclera anicteric, oral mucosa pink and moist, dentition intact, ext ear canals clear,  Neck: Supple without JVD  or lymphadenopathy Heart: Reg rate and rhythm. No murmurs rubs or gallops Chest: CTA bilaterally without wheezes, rales, or rhonchi; no distress Abdominal:     General: Abdomen is flat. There is no distension. Obese    Comments: Large pannus with irritation bilateral inguinal folds.   Musculoskeletal:     Cervical back: Normal range of motion and neck supple.     Comments: Left shoulder edema Left wrist edema  Skin:    General: Skin is warm and dry.     Comments: Right shoulder incision healed.  Left forearm ecchymosis and hematoma v/s fluid filled bulla on left wrist. Dressing in place Neurological:     Mental Status: She is alert.     Comments: Alert Motor: Right upper extremity limited by sling proximally, moving them freely. Left upper extremity: 5/5 proximal distal Left lower extremity: 4-4+/5 proximal distal Right lower extremity: 5/5 proximal distal  Psychiatric:        Mood and Affect: Affect is blunt.        Behavior: Behavior normal.    Assessment/Plan: 1. Functional deficits secondary to physical debility which require 3+ hours per day of interdisciplinary therapy in a comprehensive inpatient rehab setting.  Physiatrist is providing close team supervision and 24 hour management of active medical problems listed below.  Physiatrist and rehab team continue to assess barriers to discharge/monitor patient progress toward functional and medical goals  Care Tool:  Bathing    Body parts bathed by patient: Right arm, Chest, Abdomen, Right upper leg, Left  upper leg, Face   Body parts bathed by helper: Left arm, Front perineal area, Buttocks, Right lower leg, Left lower leg     Bathing assist Assist Level: Moderate Assistance - Patient 50 - 74%     Upper Body Dressing/Undressing Upper body dressing   What is the patient wearing?: Pull over shirt    Upper body assist Assist Level: Maximal Assistance - Patient 25 - 49%    Lower Body Dressing/Undressing Lower body  dressing      What is the patient wearing?: Incontinence brief, Pants     Lower body assist Assist for lower body dressing: Maximal Assistance - Patient 25 - 49%     Toileting Toileting    Toileting assist Assist for toileting: Maximal Assistance - Patient 25 - 49%     Transfers Chair/bed transfer  Transfers assist     Chair/bed transfer assist level: Minimal Assistance - Patient > 75%     Locomotion Ambulation   Ambulation assist      Assist level: Minimal Assistance - Patient > 75% Assistive device: Cane-straight Max distance: 63f   Walk 10 feet activity   Assist     Assist level: Minimal Assistance - Patient > 75% Assistive device: Cane-straight   Walk 50 feet activity   Assist Walk 50 feet with 2 turns activity did not occur: Safety/medical concerns         Walk 150 feet activity   Assist Walk 150 feet activity did not occur: Safety/medical concerns    Assistive device: Cane-straight    Walk 10 feet on uneven surface  activity   Assist Walk 10 feet on uneven surfaces activity did not occur: Safety/medical concerns         Wheelchair     Assist Will patient use wheelchair at discharge?: No             Wheelchair 50 feet with 2 turns activity    Assist            Wheelchair 150 feet activity     Assist          Blood pressure 128/75, pulse 83, temperature 97.8 F (36.6 C), temperature source Oral, resp. rate 16, height 5' 2"  (1.575 m), weight 86 kg, SpO2 (!) 88 %.  Medical Problem List and Plan: 1.  Balance deficits, DOE secondary to debility from CHF exacerbation, with recent history of right shoulder and left femur fracture.             -patient may shower             -ELOS/Goals: 10-14 days/supervision/min A.             Continue CIR 2.  Antithrombotics: -DVT/anticoagulation:  Pharmaceutical: Coumadin.  7/9 INR is 2.7             -antiplatelet therapy: N/A 3. Chronic back  pain/Fibromyalgia/Pain Management: Per Dr. RRoyanne Footshydrocodone prn at home              Monitor with increased exertion  7/9: Schedule Tylenol 4. Mood: LCSW to follow for evaluation and support.              -antipsychotic agents: N/A 5. Neuropsych: This patient is capable of making decisions on her own behalf. 6. Skin/Wound Care: Routine pressure relief measures. Will add nystatin for candida skin folds.  7. Fluids/Electrolytes/Nutrition: Monitor I/O. Strict I/Os.  CMP ordered. 8. Acute on chronic CHF: On Bystolic and Lasix --intolerant of statins.  Daily weights  with strict I/O and HH diet. Now on lasix once daily. 7/8 CXR is stable 9.  OSA: Has been refusing CPAP--discussed importance of use to help with respiratory/cardiac status. Husband to bring in her mask.  10. GERD: Symptoms worse since surgery but have been better since hospitalization. Will continue Pepcid (used Tagamet PTA). Resume probiotic. 11. HTN: Monitor BP tid --continue Norvasc and Lasix.              Mild with increased mobility.  7/8: systolic slightly elevated and diastolic low- continue current regimen 12. Chronic hypomagnesemia: Does not like to use supplements due to SE of diarrhea.   Magnesium improved with IV mag to 1.9 13. ABLA: H/H still low. Will add iron supplement.              7/8: Hgb 8.5 today 14. Right humerus fx: NWB RUE with sling. Pendulum exercises and AAROM with PT  15. Morbid obesity: Encouraged weight loss. 16. Insomnia: Continue melatonin 56m HS     LOS: 2 days A FACE TO FACE EVALUATION WAS PERFORMED  [name] [name] 08/20/2019, 2:17 PM

## 2019-08-21 DIAGNOSIS — G479 Sleep disorder, unspecified: Secondary | ICD-10-CM

## 2019-08-21 DIAGNOSIS — N179 Acute kidney failure, unspecified: Secondary | ICD-10-CM

## 2019-08-21 DIAGNOSIS — D62 Acute posthemorrhagic anemia: Secondary | ICD-10-CM

## 2019-08-21 DIAGNOSIS — I10 Essential (primary) hypertension: Secondary | ICD-10-CM

## 2019-08-21 DIAGNOSIS — G8918 Other acute postprocedural pain: Secondary | ICD-10-CM

## 2019-08-21 DIAGNOSIS — I5043 Acute on chronic combined systolic (congestive) and diastolic (congestive) heart failure: Secondary | ICD-10-CM

## 2019-08-21 LAB — BASIC METABOLIC PANEL
Anion gap: 10 (ref 5–15)
BUN: 24 mg/dL — ABNORMAL HIGH (ref 8–23)
CO2: 34 mmol/L — ABNORMAL HIGH (ref 22–32)
Calcium: 8.7 mg/dL — ABNORMAL LOW (ref 8.9–10.3)
Chloride: 97 mmol/L — ABNORMAL LOW (ref 98–111)
Creatinine, Ser: 1.13 mg/dL — ABNORMAL HIGH (ref 0.44–1.00)
GFR calc Af Amer: 52 mL/min — ABNORMAL LOW (ref >60)
GFR calc non Af Amer: 45 mL/min — ABNORMAL LOW (ref >60)
Glucose, Bld: 112 mg/dL — ABNORMAL HIGH (ref 70–99)
Potassium: 3.6 mmol/L (ref 3.5–5.1)
Sodium: 141 mmol/L (ref 135–145)

## 2019-08-21 LAB — PROTIME-INR
INR: 3.3 — ABNORMAL HIGH (ref 0.8–1.2)
Prothrombin Time: 32.7 seconds — ABNORMAL HIGH (ref 11.4–15.2)

## 2019-08-21 MED ORDER — WARFARIN SODIUM 1 MG PO TABS
1.0000 mg | ORAL_TABLET | Freq: Once | ORAL | Status: AC
  Administered 2019-08-21: 1 mg via ORAL
  Filled 2019-08-21: qty 1

## 2019-08-21 NOTE — Plan of Care (Signed)
  Problem: Consults Goal: RH GENERAL PATIENT EDUCATION Description: See Patient Education module for education specifics. Outcome: Progressing   Problem: RH BLADDER ELIMINATION Goal: RH STG MANAGE BLADDER WITH ASSISTANCE Description: STG Manage Bladder With Assistance Min Outcome: Progressing   Problem: RH SKIN INTEGRITY Goal: RH STG SKIN FREE OF INFECTION/BREAKDOWN Description: Skin remains free of breakdown and infection with mod assist Outcome: Progressing   Problem: RH PAIN MANAGEMENT Goal: RH STG PAIN MANAGED AT OR BELOW PT'S PAIN GOAL Description: Less than 4 Outcome: Progressing   Problem: RH KNOWLEDGE DEFICIT GENERAL Goal: RH STG INCREASE KNOWLEDGE OF SELF CARE AFTER HOSPITALIZATION Description: Patient will be able to verbalize medication, diet management of CHF with cues/handouts Outcome: Progressing

## 2019-08-21 NOTE — Progress Notes (Addendum)
[facility] for Warfarin Indication: atrial fibrillation  Patient Measurements: Height: 5' 2"  (157.5 cm) Weight: 85.8 kg (189 lb 2.5 oz) IBW/kg (Calculated) : 50.1  Vital Signs: Temp: 98.4 F (36.9 C) (07/10 0505) Temp Source: Oral (07/10 0505) BP: 130/58 (07/10 0505)  Labs: Recent Labs    08/19/19 0509 08/20/19 0749 08/21/19 0616  HGB 8.5*  --   --   HCT 30.1*  --   --   PLT 185  --   --   LABPROT 26.1* 27.9* 32.7*  INR 2.5* 2.7* 3.3*  CREATININE 0.97 1.00 1.13*    Estimated Creatinine Clearance: 38.4 mL/min (A) (by C-G formula based on SCr of 1.13 mg/dL (H)).   Assessment: 85 yr old on warfarin for Afib, admitted for HF exacerbation. Eating ~80% of meals (improving from last couple of days). H/H, plt stable. Patient has some bruising--not new per nurse.   Home warfarin: 2.5 mg po daily, except for 3.75 mg po on Monday, Friday (pt reported taking 3 mg on 08/14/19 but has 2.71m tablets at home)  INR 3.3 today. Will reduce dose.   Goal of Therapy:  INR 2-3 Monitor platelets by anticoagulation protocol: Yes   Plan:  Warfarin 1 mg today Monitor daily INR, CBC Monitor for signs/symptoms of bleeding  MRebbeca Paul PharmD PGY1 Pharmacy Resident 08/21/2019 9:03 AM  Please check [email or web address] for unit-specific pharmacy phone numbers.

## 2019-08-21 NOTE — IPOC Note (Addendum)
Individualized overall Plan of Care [facility]) Patient Details Name: [name] MRN: [id number] DOB: 12-19-1934  Admitting Diagnosis: Physical debility  Hospital Problems: Principal Problem:   Physical debility Active Problems:   AKI (acute kidney injury) ([place])   Sleep disturbance   Acute on chronic combined systolic and diastolic heart failure (HCC)   Postoperative pain     Functional Problem List: Nursing Bladder, Edema, Medication Management, Nutrition, Pain, Safety, Skin Integrity  PT Balance, Safety, Sensory, Skin Integrity, Endurance, Motor, Pain  OT Balance, Motor, Sensory, Pain, Endurance  SLP    TR         Basic ADL's: OT Bathing, Dressing, Toileting     Advanced  ADL's: OT       Transfers: PT Bed Mobility, Bed to Chair, Car, Manufacturing systems engineer, Metallurgist: PT Ambulation, Emergency planning/management officer, Stairs     Additional Impairments: OT Fuctional Use of Upper Extremity  SLP        TR      Anticipated Outcomes Item Anticipated Outcome  Self Feeding setup  Swallowing      Basic self-care  min assist  Toileting  min assist   Bathroom Transfers CGA  Bowel/Bladder  manage urinary urgency, maintain continence and regular pattern of voiding  Transfers  CGA  Locomotion  CGA  Communication     Cognition     Pain  less than 4  Safety/Judgment  Remain free of falls, skin breakdown, infection   Therapy Plan: PT Intensity: Minimum of 1-2 x/day ,45 to 90 minutes PT Frequency: 5 out of 7 days PT Duration Estimated Length of Stay: 14 days OT Intensity: Minimum of 1-2 x/day, 45 to 90 minutes OT Frequency: 5 out of 7 days OT Duration/Estimated Length of Stay: 10-14 days      Team Interventions: Nursing Interventions Patient/Family Education, Pain Management, Bladder Management, Medication Management, Skin Care/Wound Management, Psychosocial Support, Disease Management/Prevention  PT interventions Ambulation/gait training, DME/adaptive  equipment instruction, Stair training, UE/LE Strength taining/ROM, Wheelchair propulsion/positioning, Training and development officer, Discharge planning, Pain management, Skin care/wound management, Therapeutic Activities, UE/LE Coordination activities, Functional mobility training, Patient/family education, Therapeutic Exercise  OT Interventions Balance/vestibular training, Neuromuscular re-education, Patient/family education, Self Care/advanced ADL retraining, Therapeutic Exercise, UE/LE Coordination activities, Discharge planning, DME/adaptive equipment instruction, Functional mobility training, Pain management, Therapeutic Activities, UE/LE Strength taining/ROM  SLP Interventions    TR Interventions    SW/CM Interventions Discharge Planning, Psychosocial Support, Patient/Family Education   Barriers to Discharge MD  Medical stability, Weight and Weight bearing restrictions  Nursing      PT Inaccessible home environment, Home environment access/layout, Weight, Weight bearing restrictions, New oxygen stairs at all entrances, single rail at each  OT Weight bearing restrictions RUE NWB  SLP      SW Decreased caregiver support, Lack of/limited family support, New oxygen     Team Discharge Planning: Destination: PT-Home ,OT- Home , SLP-  Projected Follow-up: PT-Home health PT, 24 hour supervision/assistance, OT-  Home health OT, SLP-  Projected Equipment Needs: PT-To be determined, OT- To be determined, SLP-  Equipment Details: PT- , OT-  Patient/family involved in discharge planning: PT- Patient,  OT-Patient, SLP-   MD ELOS: 10-14 days Medical Rehab Prognosis:  Good Assessment: 85 year old female with history of HTN, OSA-on CPAP, CAF- on coumadin, chronic diastolic CHF, morbid obesity, DJD/DDD with chronic pain, recent admission for left femur Fx s/p ORIF and right humerus fx with reverse total shoulder on 07/20/2019 and was sent to  [facility] for rehab.  She reports that she was have increase  in SOB with weigh gain and was discharged to home on 08/14/2019 but readmitted on 08/15/2019 with CHF exacerbation.  She had progressive dyspnea.  She was treated with IV diuresis as well as non-rebreather mask.  She was found to have hypomagnesemia which was supplemented, anemia with Hgb 8.7 and reported problem swallowing.  MBS done revealing normal swallow and symptoms felt to be due to GERD. She has been treated with IV diuresis with resolution of dyspnea and continues to require supplemental oxygen.  Weight down lbs but follow up CXR  today with increasing infiltrates.    Patient with resulting functional deficits with balance, requires frequent rest breaks, hypoxia with SOB and fatigue with minimal activity.  We will set goals for CTA with PT and min a with OT.   Due to the current state of emergency, patients may not be receiving their 3-hours of Medicare-mandated therapy.  See Team Conference Notes for weekly updates to the plan of care

## 2019-08-21 NOTE — Progress Notes (Signed)
RT placed pt on CPAP dream station on pts on setting of 11cmH2O w/3 lpm bled into the system. Pt respiratory status is stable at this time. RT will continue to monitor.

## 2019-08-21 NOTE — Progress Notes (Signed)
[place] PHYSICAL MEDICINE & REHABILITATION PROGRESS NOTE   Subjective/Complaints: Patient seen sitting up in bed this morning.  She states she slept well overnight.  She appears somewhat confused regarding schedule.  She has questions regarding what she can do today, reviewed activities as well as scheduling with patient.  ROS: Denies CP, SOB, N/V/D  Objective:   No results found. Recent Labs    08/19/19 0509  WBC 4.5  HGB 8.5*  HCT 30.1*  PLT 185   Recent Labs    08/20/19 0749 08/21/19 0616  NA 141 141  K 3.6 3.6  CL 95* 97*  CO2 37* 34*  GLUCOSE 133* 112*  BUN 21 24*  CREATININE 1.00 1.13*  CALCIUM 8.8* 8.7*    Intake/Output Summary (Last 24 hours) at 08/21/2019 0926 Last data filed at 08/21/2019 0815 Gross per 24 hour  Intake 764 ml  Output --  Net 764 ml     Physical Exam: Vital Signs Blood pressure (!) 130/58, pulse 64, temperature 98.4 F (36.9 C), temperature source Oral, resp. rate 16, height 5' 2"  (1.575 m), weight 85.8 kg, SpO2 98 %. Constitutional: No distress . Vital signs reviewed.  Obese. HENT: Normocephalic.  Atraumatic. Eyes: EOMI. No discharge. Cardiovascular: No JVD. Respiratory: Normal effort.  No stridor. GI: Non-distended. Skin: Left forearm ecchymosis Psych: Somewhat confused. Musc: No edema in extremities.  No tenderness in extremities. Neuro: Alert Motor: RUE: Limited by sling proximally, distally moving freely LUE: 5/5 proximal distal RLE: 4+/5 proximal distally LLE: 5/5 proximal to distal  Assessment/Plan: 1. Functional deficits secondary to physical debility which require 3+ hours per day of interdisciplinary therapy in a comprehensive inpatient rehab setting.  Physiatrist is providing close team supervision and 24 hour management of active medical problems listed below.  Physiatrist and rehab team continue to assess barriers to discharge/monitor patient progress toward functional and medical goals  Care  Tool:  Bathing    Body parts bathed by patient: Right arm, Chest, Abdomen, Right upper leg, Left upper leg, Face   Body parts bathed by helper: Left arm, Front perineal area, Buttocks, Right lower leg, Left lower leg     Bathing assist Assist Level: Moderate Assistance - Patient 50 - 74%     Upper Body Dressing/Undressing Upper body dressing   What is the patient wearing?: Pull over shirt    Upper body assist Assist Level: Maximal Assistance - Patient 25 - 49%    Lower Body Dressing/Undressing Lower body dressing      What is the patient wearing?: Incontinence brief     Lower body assist Assist for lower body dressing: Maximal Assistance - Patient 25 - 49%     Toileting Toileting    Toileting assist Assist for toileting: Minimal Assistance - Patient > 75%     Transfers Chair/bed transfer  Transfers assist     Chair/bed transfer assist level: Minimal Assistance - Patient > 75%     Locomotion Ambulation   Ambulation assist      Assist level: Minimal Assistance - Patient > 75% Assistive device: Cane-straight Max distance: 100'   Walk 10 feet activity   Assist     Assist level: Minimal Assistance - Patient > 75% Assistive device: Cane-straight   Walk 50 feet activity   Assist Walk 50 feet with 2 turns activity did not occur: Safety/medical concerns  Assist level: Minimal Assistance - Patient > 75% Assistive device: Cane-straight    Walk 150 feet activity   Assist Walk 150 feet activity  did not occur: Safety/medical concerns    Assistive device: Cane-straight    Walk 10 feet on uneven surface  activity   Assist Walk 10 feet on uneven surfaces activity did not occur: Safety/medical concerns         Wheelchair     Assist Will patient use wheelchair at discharge?: No             Wheelchair 50 feet with 2 turns activity    Assist            Wheelchair 150 feet activity     Assist          Blood  pressure (!) 130/58, pulse 64, temperature 98.4 F (36.9 C), temperature source Oral, resp. rate 16, height 5' 2"  (1.575 m), weight 85.8 kg, SpO2 98 %.  Medical Problem List and Plan: 1.  Balance deficits, DOE secondary to debility from CHF exacerbation, with recent history of right shoulder and left femur fracture.  Continue CIR 2.  Antithrombotics: -DVT/anticoagulation:  Pharmaceutical: Coumadin (goal 2-3).   INR supratherapeutic on 7/10             -antiplatelet therapy: N/A 3. Chronic back pain/Fibromyalgia/Pain Management: Per Dr. Royanne Foots hydrocodone prn at home              Monitor with increased exertion  Scheduled Tylenol  Controlled with meds on 7/10 4. Mood: LCSW to follow for evaluation and support.              -antipsychotic agents: N/A 5. Neuropsych: This patient is capable of making decisions on her own behalf. 6. Skin/Wound Care: Routine pressure relief measures.  Added nystatin for candida skin folds.  7. Fluids/Electrolytes/Nutrition: Monitor I/O. Strict I/Os.   8. Acute on chronic CHF: On Bystolic and Lasix --intolerant of statins.  Daily weights with strict I/Od and HH diet. Now on lasix once daily. 7/8 CXR is stable Filed Weights   08/19/19 0355 08/20/19 0232 08/21/19 0505  Weight: 85.5 kg 86 kg 85.8 kg   9.  OSA: Has been refusing CPAP--discussed importance of use to help with respiratory/cardiac status. Husband to bring in her mask.  10. GERD: Symptoms worse since surgery but have been better since hospitalization.  Continue Pepcid (used Tagamet PTA). Resumed probiotic. 11. HTN: Monitor BP--continue Norvasc and Lasix.              Mild with increased mobility.  Relatively controlled on 7/10 12. Chronic hypomagnesemia: Does not like to use supplements due to SE of diarrhea.   Magnesium improved with IV mag to 1.9 on 7/9 13. ABLA: Added iron supplement.            Hemoglobin 8.5 on 7/8 14. Right humerus fx: NWB RUE with sling. Pendulum exercises and AAROM  with PT  15. Morbid obesity: Encouraged weight loss. 16.  Sleep disturbance: Continue melatonin 44m HS 17.  AKI  Creatinine 1.13 on 7/10, labs ordered for Monday  Encourage fluids  Continue to monitor  LOS: 3 days A FACE TO FACE EVALUATION WAS PERFORMED  Drew Herman ALorie Phenix7/11/2019, 9:26 AM

## 2019-08-22 ENCOUNTER — Inpatient Hospital Stay (HOSPITAL_COMMUNITY): Payer: Medicare Other | Admitting: Occupational Therapy

## 2019-08-22 ENCOUNTER — Inpatient Hospital Stay (HOSPITAL_COMMUNITY): Payer: Medicare Other | Admitting: Physical Therapy

## 2019-08-22 DIAGNOSIS — R791 Abnormal coagulation profile: Secondary | ICD-10-CM

## 2019-08-22 DIAGNOSIS — G894 Chronic pain syndrome: Secondary | ICD-10-CM

## 2019-08-22 DIAGNOSIS — Z9981 Dependence on supplemental oxygen: Secondary | ICD-10-CM

## 2019-08-22 LAB — BASIC METABOLIC PANEL
Anion gap: 10 (ref 5–15)
BUN: 22 mg/dL (ref 8–23)
CO2: 33 mmol/L — ABNORMAL HIGH (ref 22–32)
Calcium: 8.5 mg/dL — ABNORMAL LOW (ref 8.9–10.3)
Chloride: 98 mmol/L (ref 98–111)
Creatinine, Ser: 1.01 mg/dL — ABNORMAL HIGH (ref 0.44–1.00)
GFR calc Af Amer: 60 mL/min — ABNORMAL LOW (ref >60)
GFR calc non Af Amer: 51 mL/min — ABNORMAL LOW (ref >60)
Glucose, Bld: 111 mg/dL — ABNORMAL HIGH (ref 70–99)
Potassium: 3.4 mmol/L — ABNORMAL LOW (ref 3.5–5.1)
Sodium: 141 mmol/L (ref 135–145)

## 2019-08-22 LAB — CBC
HCT: 29 % — ABNORMAL LOW (ref 36.0–46.0)
Hemoglobin: 8.4 g/dL — ABNORMAL LOW (ref 12.0–15.0)
MCH: 27.5 pg (ref 26.0–34.0)
MCHC: 29 g/dL — ABNORMAL LOW (ref 30.0–36.0)
MCV: 95.1 fL (ref 80.0–100.0)
Platelets: 167 10*3/uL (ref 150–400)
RBC: 3.05 MIL/uL — ABNORMAL LOW (ref 3.87–5.11)
RDW: 17.6 % — ABNORMAL HIGH (ref 11.5–15.5)
WBC: 4.5 10*3/uL (ref 4.0–10.5)
nRBC: 0 % (ref 0.0–0.2)

## 2019-08-22 LAB — PROTIME-INR
INR: 3.6 — ABNORMAL HIGH (ref 0.8–1.2)
Prothrombin Time: 35 seconds — ABNORMAL HIGH (ref 11.4–15.2)

## 2019-08-22 NOTE — Progress Notes (Signed)
Asked pt about wearing cpap she states that she feels fine with just oxygen, she stated she had her own cpap here and I asked if she would like me to set it up for her, but she states she prefers just sleeping with oxygen.

## 2019-08-22 NOTE — Progress Notes (Signed)
ANTICOAGULATION CONSULT NOTE  Pharmacy Consult for Warfarin Indication: atrial fibrillation  Patient Measurements: Height: 5' 2"  (157.5 cm) Weight: 87 kg (191 lb 12.8 oz) IBW/kg (Calculated) : 50.1  Vital Signs: Temp: 97.7 F (36.5 C) (07/11 0430) Temp Source: Oral (07/11 0430) BP: 120/65 (07/11 0430) Pulse Rate: 76 (07/11 0430)  Labs: Recent Labs    08/20/19 0749 08/21/19 0616 08/22/19 0612  HGB  --   --  8.4*  HCT  --   --  29.0*  PLT  --   --  167  LABPROT 27.9* 32.7* 35.0*  INR 2.7* 3.3* 3.6*  CREATININE 1.00 1.13* 1.01*    Estimated Creatinine Clearance: 43.2 mL/min (A) (by C-G formula based on SCr of 1.01 mg/dL (H)).   Assessment: 85 yr old on warfarin for Afib, admitted for HF exacerbation. Eating 80-100% of meals (improving from last couple of days). H/H, plt stable. Patient has no bleeding but some bruising--not new per patient. She states her INR has never been this high before and she is usually stable on home regimen. Eating less greens while inpatient vs fluid status changes may be contributing.   Home warfarin: 2.5 mg po daily, except for 3.75 mg po on Monday, Friday (pt reported taking 3 mg on 08/14/19 but has 2.93m tablets at home)  INR supratherapeutic at 3.6 today (despite dose reduction to 120myesterday).   Goal of Therapy:  INR 2-3 Monitor platelets by anticoagulation protocol: Yes   Plan:  Hold warfarin today.  Monitor daily INR, CBC Monitor for signs/symptoms of bleeding  MaRebbeca PaulPharmD PGY1 Pharmacy Resident 08/22/2019 9:08 AM  Please check [email or web address] for unit-specific pharmacy phone numbers.

## 2019-08-22 NOTE — Progress Notes (Signed)
[place] PHYSICAL MEDICINE & REHABILITATION PROGRESS NOTE   Subjective/Complaints: Patient seen sitting up in her chair this morning.  She states she slept well overnight.  She states she feels better.  She has questions regarding chest x-ray.  ROS: Denies CP, SOB, N/V/D  Objective:   No results found. Recent Labs    08/22/19 0612  WBC 4.5  HGB 8.4*  HCT 29.0*  PLT 167   Recent Labs    08/21/19 0616 08/22/19 0612  NA 141 141  K 3.6 3.4*  CL 97* 98  CO2 34* 33*  GLUCOSE 112* 111*  BUN 24* 22  CREATININE 1.13* 1.01*  CALCIUM 8.7* 8.5*    Intake/Output Summary (Last 24 hours) at 08/22/2019 0753 Last data filed at 08/21/2019 1912 Gross per 24 hour  Intake 764 ml  Output --  Net 764 ml     Physical Exam: Vital Signs Blood pressure 120/65, pulse 76, temperature 97.7 F (36.5 C), temperature source Oral, resp. rate 19, height 5' 2"  (1.575 m), weight 87 kg, SpO2 96 %. Constitutional: No distress . Vital signs reviewed.  Morbidly obese. HENT: Normocephalic.  Atraumatic. Eyes: EOMI. No discharge. Cardiovascular: No JVD.  RRR. Respiratory: Normal effort.  No stridor.  Bilaterally clear to auscultation.  + [place]. GI: Non-distended.  BS +. Skin: Left forearm ecchymosis Left wrist with dressing C/D/I Psych: Normal mood.  Normal behavior. Musc: No edema in extremities.  No tenderness in extremities. Neuro: Alert Motor: RUE: Limited by sling proximally, handgrip 4+/5 LUE: 5/5 proximal distal RLE: 4+/5 proximal distally, stable LLE: 5/5 proximal to distal  Assessment/Plan: 1. Functional deficits secondary to physical debility which require 3+ hours per day of interdisciplinary therapy in a comprehensive inpatient rehab setting.  Physiatrist is providing close team supervision and 24 hour management of active medical problems listed below.  Physiatrist and rehab team continue to assess barriers to discharge/monitor patient progress toward functional and medical  goals  Care Tool:  Bathing    Body parts bathed by patient: Right arm, Chest, Abdomen, Right upper leg, Left upper leg, Face   Body parts bathed by helper: Left arm, Front perineal area, Buttocks, Right lower leg, Left lower leg     Bathing assist Assist Level: Moderate Assistance - Patient 50 - 74%     Upper Body Dressing/Undressing Upper body dressing   What is the patient wearing?: Pull over shirt    Upper body assist Assist Level: Minimal Assistance - Patient > 75%    Lower Body Dressing/Undressing Lower body dressing      What is the patient wearing?: Incontinence brief     Lower body assist Assist for lower body dressing: Maximal Assistance - Patient 25 - 49%     Toileting Toileting    Toileting assist Assist for toileting: Minimal Assistance - Patient > 75%     Transfers Chair/bed transfer  Transfers assist     Chair/bed transfer assist level: Minimal Assistance - Patient > 75%     Locomotion Ambulation   Ambulation assist      Assist level: Minimal Assistance - Patient > 75% Assistive device: Cane-straight Max distance: 100'   Walk 10 feet activity   Assist     Assist level: Minimal Assistance - Patient > 75% Assistive device: Cane-straight   Walk 50 feet activity   Assist Walk 50 feet with 2 turns activity did not occur: Safety/medical concerns  Assist level: Minimal Assistance - Patient > 75% Assistive device: Cane-straight    Walk 150  feet activity   Assist Walk 150 feet activity did not occur: Safety/medical concerns    Assistive device: Cane-straight    Walk 10 feet on uneven surface  activity   Assist Walk 10 feet on uneven surfaces activity did not occur: Safety/medical concerns         Wheelchair     Assist Will patient use wheelchair at discharge?: No             Wheelchair 50 feet with 2 turns activity    Assist            Wheelchair 150 feet activity     Assist           Blood pressure 120/65, pulse 76, temperature 97.7 F (36.5 C), temperature source Oral, resp. rate 19, height 5' 2"  (1.575 m), weight 87 kg, SpO2 96 %.  Medical Problem List and Plan: 1.  Balance deficits, DOE secondary to debility from CHF exacerbation, with recent history of right shoulder and left femur fracture.  Continue CIR 2.  Antithrombotics: -DVT/anticoagulation:  Pharmaceutical: Coumadin (goal 2-3).   INR supratherapeutic on 7/11             -antiplatelet therapy: N/A 3. Chronic back pain/Fibromyalgia/Pain Management: Per Dr. Royanne Foots hydrocodone prn at home              Monitor with increased exertion  Scheduled Tylenol  Controlled with meds on 7/11 4. Mood: LCSW to follow for evaluation and support.              -antipsychotic agents: N/A 5. Neuropsych: This patient is capable of making decisions on her own behalf. 6. Skin/Wound Care: Routine pressure relief measures.  Added nystatin for candida skin folds.  7. Fluids/Electrolytes/Nutrition: Monitor I/O. Strict I/Os.   8. Acute on chronic CHF: On Bystolic and Lasix --intolerant of statins.  Daily weights with strict I/Od and HH diet. Now on lasix once daily.  Filed Weights   08/20/19 0232 08/21/19 0505 08/22/19 0430  Weight: 86 kg 85.8 kg 87 kg   ?  Trending up on 7/11, continue to monitor 9.  OSA: Has been refusing CPAP--important to help with respiratory/cardiac status. Husband to bring in her mask.   Supplemental oxygen dependent PTA, continue 10. GERD: Symptoms worse since surgery but have been better since hospitalization.  Continue Pepcid (used Tagamet PTA). Resumed probiotic. 11. HTN: Monitor BP--continue Norvasc and Lasix.              Monitor with increased mobility.  Controlled on 7/11 12. Chronic hypomagnesemia: Does not like to use supplements due to SE of diarrhea.   Magnesium improved with IV mag to 1.9 on 7/9 13. ABLA: Added iron supplement.            Hemoglobin 8.4 on 7/11 14. Right humerus fx:  NWB RUE with sling. Pendulum exercises and AAROM with PT  15. Morbid obesity: Encouraged weight loss. 16.  Sleep disturbance: Continue melatonin 30m HS  Improving 17.  AKI  Creatinine 1.01 on 7/11   Encourage fluids  Continue to monitor  LOS: 4 days A FACE TO FACE EVALUATION WAS PERFORMED  Oseas Detty ALorie Phenix7/12/2019, 7:53 AM

## 2019-08-22 NOTE — Progress Notes (Signed)
Occupational Therapy Session Note  Patient Details  Name: [name] MRN: [id number] Date of Birth: May 18, 1934  Today's Date: 08/22/2019 OT Individual Time: 1100-1208 and 1515- and 1600 OT Individual Time Calculation (min): 68 min and 45 mins   Short Term Goals: Week 1:  OT Short Term Goal 1 (Week 1): Pt will completed toileting 3/3 steps with min assist. OT Short Term Goal 2 (Week 1): Pt will complete UB bathing with min assist. OT Short Term Goal 3 (Week 1): Pt will complete LB dressing with AE as needed with mod assist. OT Short Term Goal 4 (Week 1): Pt will complete UB dressing with min assist.  Skilled Therapeutic Interventions/Progress Updates:    Session 1: Upon entering the room, pt seated in wheelchair and reports 9/10 pain in lower back and buttocks. OToffering to assist pt into shower this session but she declined secondary to pain. Pt was agreeable to try different position and transferred from wheelchair >recliner chair and OT adjusting R UE sling to give greater support. OT applied heat to lower back and monitored during this session. Pt reports getting relief with heat and pain decreasing to a " 3 or 4". OT engaged pt in energy conservation education this session. At end of session, pt requesting to transfer back into wheelchair for lunch as she would set up highter. Sit >stand with min A and stand pivot transfer with min guard into wheelchair. OT reviewed schedule and recommended pt rest in bed between next two therapy session for pain management. Pt verbalized understanding and agreement. Call bell and all needed items within reach upon exiting the room.   Session 2: Upon entering the room, pt supine in bed and reports having spasms in back and pain is "just starting to loosen up". RN reports medication given prior to OT arrival. OT discussing bathroom set up with home plan to be for pt to have sliding glass doors removed , add tension rod with shower curtain and then place TTB  for safety with transfer. Pt unsure if family plans on purchasing vs therapy ordering equipment but verbalized agreement with this plan and need for TTB. Pt performing supine >sit with min A to EOB. Pt standing with min A and holding onto bed rail for therapist to remove sling and pt perform pendulum exercises in both directions 3 sets of 10. Pt does need cuing to move body for movement vs. At shoulder. Pt returning to bed with supervision for sit >supine. Call bell and all needed items within reach. Bed alarm activated.   Therapy Documentation Precautions:  Precautions Precautions: Fall, Shoulder Type of Shoulder Precautions: per chart/ortho note pendulums; NWB RUE Shoulder Interventions: Shoulder sling/immobilizer, Off for dressing/bathing/exercises Precaution Comments: NWB RUE per MD note Required Braces or Orthoses: Sling Restrictions Weight Bearing Restrictions: Yes RUE Weight Bearing: Non weight bearing Other Position/Activity Restrictions: RUE per chart Pain: Pain Assessment Pain Scale: 0-10 Pain Score: 0-No pain ADL: ADL Eating: Set up Where Assessed-Eating: Bed level Grooming: Setup Where Assessed-Grooming: Sitting at sink Upper Body Bathing: Moderate assistance, Moderate cueing Where Assessed-Upper Body Bathing: Sitting at sink Lower Body Bathing: Maximal assistance, Minimal cueing Where Assessed-Lower Body Bathing: Sitting at sink, Standing at sink Upper Body Dressing: Moderate assistance Where Assessed-Upper Body Dressing: Sitting at sink Lower Body Dressing: Maximal assistance Where Assessed-Lower Body Dressing: Standing at sink, Sitting at sink Toileting: Maximal assistance Where Assessed-Toileting: Glass blower/designer: Psychiatric nurse Method: Arts development officer: Grab bars   Therapy/Group: Individual  Therapy  Gypsy Decant 08/22/2019, 12:24 PM

## 2019-08-22 NOTE — Progress Notes (Signed)
Physical Therapy Session Note  Patient Details  Name: [name] MRN: [id number] Date of Birth: 09-14-1934  Today's Date: 08/22/2019 PT Individual Time:804 -859 and 1332-1400 PT Individual Time Calculation (min): 28 min and 55 min(AM)  Short Term Goals: Week 1:  PT Short Term Goal 1 (Week 1): Pt will ambulate 84f w safe use of /LRAD and min assist PT Short Term Goal 2 (Week 1): Pt will ascend/descend one 5in step w/single rail w/min assist PT Short Term Goal 3 (Week 1): bed to/from wc w/min assist and safe use of LRAD PT Short Term Goal 4 (Week 1): Pt will propel hemiheight wc up to 511fw/bilat LEs and min assist  Skilled Therapeutic Interventions/Progress Updates:  First session:   Pt presents sitting in w/c, agreeable to therapy.  Pt wheeled to gym for energy and time conservation.  Pt performed 3 x 2' Kinetron at 20 cm/sec to increase strength.  Pt performed LE there ex seated including LAQ, hip flexion 3 x 15.  Pt w/ increasing c/o pain w/ right shoulder as progressed in session.  Adjusted sling to improve placement and support.  Pt required min A for sit to stand and verbal cues for hand placement.  Pt amb multiple trials w/ SPC and CGA.  Verbal cues given for sequencing and cane management, especially w/ turns.  Pt O2 sats decreased only to 95% on 2 LPM w/ activity.  Pt amb through obstacles course of cones.  Pt required extended seated rest breaks d/t pain.  Pt wheeled to room and notified nurse of request for pain meds.  Chair alarm on and all needs in reach.  Second session: Pt amb multiple trials w/ SPC and CGA including turns to approach surface.  Pt amb up to 90' w/ need for seated rest breaks.  Pt amb into room and to bed.  Pt transfers sit to supine w/ supervision and able to bridge to move self into middle.  Bed alarm on and all needs in reach.      Therapy Documentation Precautions:  Precautions Precautions: Fall, Shoulder Type of Shoulder Precautions: per chart/ortho  note pendulums; NWB RUE Shoulder Interventions: Shoulder sling/immobilizer, Off for dressing/bathing/exercises Precaution Comments: NWB RUE per MD note Required Braces or Orthoses: Sling Restrictions Weight Bearing Restrictions: Yes RUE Weight Bearing: Non weight bearing Other Position/Activity Restrictions: RUE per chart General:   Vital Signs:   Pain:5/10 generalized throughout body per pt. Pain Assessment Pain Scale: 0-10 Pain Score: 0-No pain Mobility:   Locomotion :    Trunk/Postural Assessment :    Balance:   Exercises:   Other Treatments:      Therapy/Group: Individual Therapy  JeLadoris Gene/12/2019, 2:20 PM

## 2019-08-22 NOTE — Plan of Care (Signed)
  Problem: Consults Goal: RH GENERAL PATIENT EDUCATION Description: See Patient Education module for education specifics. Outcome: Progressing   Problem: RH BLADDER ELIMINATION Goal: RH STG MANAGE BLADDER WITH ASSISTANCE Description: STG Manage Bladder With Assistance Min Outcome: Progressing   Problem: RH SKIN INTEGRITY Goal: RH STG SKIN FREE OF INFECTION/BREAKDOWN Description: Skin remains free of breakdown and infection with mod assist Outcome: Progressing   Problem: RH PAIN MANAGEMENT Goal: RH STG PAIN MANAGED AT OR BELOW PT'S PAIN GOAL Description: Less than 4 Outcome: Progressing   Problem: RH KNOWLEDGE DEFICIT GENERAL Goal: RH STG INCREASE KNOWLEDGE OF SELF CARE AFTER HOSPITALIZATION Description: Patient will be able to verbalize medication, diet management of CHF with cues/handouts Outcome: Progressing

## 2019-08-23 ENCOUNTER — Inpatient Hospital Stay (HOSPITAL_COMMUNITY): Payer: Medicare Other

## 2019-08-23 ENCOUNTER — Inpatient Hospital Stay (HOSPITAL_COMMUNITY): Payer: Medicare Other | Admitting: Occupational Therapy

## 2019-08-23 LAB — BASIC METABOLIC PANEL
Anion gap: 11 (ref 5–15)
BUN: 24 mg/dL — ABNORMAL HIGH (ref 8–23)
CO2: 29 mmol/L (ref 22–32)
Calcium: 9 mg/dL (ref 8.9–10.3)
Chloride: 100 mmol/L (ref 98–111)
Creatinine, Ser: 1.13 mg/dL — ABNORMAL HIGH (ref 0.44–1.00)
GFR calc Af Amer: 52 mL/min — ABNORMAL LOW (ref >60)
GFR calc non Af Amer: 45 mL/min — ABNORMAL LOW (ref >60)
Glucose, Bld: 132 mg/dL — ABNORMAL HIGH (ref 70–99)
Potassium: 4.4 mmol/L (ref 3.5–5.1)
Sodium: 140 mmol/L (ref 135–145)

## 2019-08-23 LAB — CBC
HCT: 34.2 % — ABNORMAL LOW (ref 36.0–46.0)
Hemoglobin: 9.7 g/dL — ABNORMAL LOW (ref 12.0–15.0)
MCH: 26.8 pg (ref 26.0–34.0)
MCHC: 28.4 g/dL — ABNORMAL LOW (ref 30.0–36.0)
MCV: 94.5 fL (ref 80.0–100.0)
Platelets: 153 10*3/uL (ref 150–400)
RBC: 3.62 MIL/uL — ABNORMAL LOW (ref 3.87–5.11)
RDW: 17.6 % — ABNORMAL HIGH (ref 11.5–15.5)
WBC: 5.5 10*3/uL (ref 4.0–10.5)
nRBC: 0 % (ref 0.0–0.2)

## 2019-08-23 LAB — PROTIME-INR
INR: 2.9 — ABNORMAL HIGH (ref 0.8–1.2)
Prothrombin Time: 29.6 seconds — ABNORMAL HIGH (ref 11.4–15.2)

## 2019-08-23 MED ORDER — METHOCARBAMOL 500 MG PO TABS
500.0000 mg | ORAL_TABLET | Freq: Four times a day (QID) | ORAL | Status: DC | PRN
  Administered 2019-08-23 – 2019-08-24 (×2): 500 mg via ORAL
  Filled 2019-08-23 (×2): qty 1

## 2019-08-23 MED ORDER — WARFARIN SODIUM 2 MG PO TABS
2.0000 mg | ORAL_TABLET | Freq: Every day | ORAL | Status: DC
  Administered 2019-08-23 – 2019-08-25 (×3): 2 mg via ORAL
  Filled 2019-08-23 (×3): qty 1

## 2019-08-23 NOTE — Progress Notes (Signed)
Occupational Therapy Session Note  Patient Details  Name: [name] MRN: [id number] Date of Birth: May 12, 1934  Today's Date: 08/23/2019 OT Individual Time: 7943-2761 and 4709-2957 OT Individual Time Calculation (min): 55 min and 54 min   Short Term Goals: Week 1:  OT Short Term Goal 1 (Week 1): Pt will completed toileting 3/3 steps with min assist. OT Short Term Goal 2 (Week 1): Pt will complete UB bathing with min assist. OT Short Term Goal 3 (Week 1): Pt will complete LB dressing with AE as needed with mod assist. OT Short Term Goal 4 (Week 1): Pt will complete UB dressing with min assist.  Skilled Therapeutic Interventions/Progress Updates:    Pt greeted at time of session sitting EOB just finishing with MD, no c/o pain at first but did c/o mild low back pain at end of session. SPT bed > wheelchair with Min A for sit to stand and transfer with hand held assist, transferred to shower bench in same manner. Doffed brief/hospital gown Mod A and performed UB/LB bathing with Mod A overall for assistance to wash LUE d/t inability to use RUE, BLEs past knee level (attempted figure four but unable), assistance washing buttocks in standing with unilateral support on grab bar. Pt required frequent cues to not bear weight through RUE and to maintain precautions at shoulder. Dried off in the same manner and performed UB dressing Mod A with hemidressing techniques and assistance to don sling. LB dressing Mod with therapist assist to thread feet but pt able to assist in standing with help to don over R hip d/t body habitus making reaching her right side difficult. Rest breaks required throughout bathing/dressing session d/t SOB and required O2 via [place] at times. Pt set up in wheelchair with O2 at 2L, alarm on, call bell in reach, all needs met.   Session 2: pt greeted at time of session just finished lunch, resting comfortably on O2 at 2L, remained on O2 throughout session. C/o low back pain which is chronic  and repositioning provided for comfort as pt had just gotten pain meds. Reviewed one handed techniques to manage her food, discussed AE for eating but pt is not interested. Pt brought to toilet via wheelchair and performed stand pivot to toilet with CGA/Min with use of LUE on grab bar and static stand with CGA/Min support in standing and Min A to doff clothing. Reviewed techniques for hygiene, difficult d/t short arm length and body habitus. Pt able to static stand with CGA and perform clothing management, needed Min-Mod A over R hip. Stand pivot back to wheelchair Min/CGA and provided positioning strategies for RUE to prevent breaking shoulder precautions and support shoulder joint. Positioned at wheelchair level with pillows for comfort and to maintain precautions. Reviewed use of LHR and sponge, pt given both and are in her room for future sessions. Pt up in chair with alarm on, call bell in reach, all needs met.   Therapy Documentation Precautions:  Precautions Precautions: Fall, Shoulder Type of Shoulder Precautions: per chart/ortho note pendulums; NWB RUE Shoulder Interventions: Shoulder sling/immobilizer, Off for dressing/bathing/exercises Precaution Comments: NWB RUE per MD note Required Braces or Orthoses: Sling Restrictions Weight Bearing Restrictions: Yes RUE Weight Bearing: Non weight bearing Other Position/Activity Restrictions: RUE per chart     Therapy/Group: Individual Therapy  Viona Gilmore 08/23/2019, 10:07 AM

## 2019-08-23 NOTE — Care Management (Signed)
Inpatient [facility] Individual Statement of Services  Patient Name:  [name]  Date:  08/23/2019  Welcome to the [facility].  Our goal is to provide you with an individualized program based on your diagnosis and situation, designed to meet your specific needs.  With this comprehensive rehabilitation program, you will be expected to participate in at least 3 hours of rehabilitation therapies Monday-Friday, with modified therapy programming on the weekends.  Your rehabilitation program will include the following services:  Physical Therapy (PT), Occupational Therapy (OT), Speech Therapy (ST), 24 hour per day rehabilitation nursing, Therapeutic Recreaction (TR), Psychology, Neuropsychology, Care Coordinator, Rehabilitation Medicine, Nutrition Services, Pharmacy Services and Other  Weekly team conferences will be held on Tuesdays to discuss your progress.  Your Inpatient Rehabilitation Care Coordinator will talk with you frequently to get your input and to update you on team discussions.  Team conferences with you and your family in attendance may also be held.  Expected length of stay: 10-14 days   Overall anticipated outcome: Contact Guard Assist  Depending on your progress and recovery, your program may change. Your Inpatient Rehabilitation Care Coordinator will coordinate services and will keep you informed of any changes. Your Inpatient Rehabilitation Care Coordinator's name and contact numbers are listed  below.  The following services may also be recommended but are not provided by the [facility] will be made to provide these services after discharge if needed.  Arrangements include referral to agencies that provide these services.  Your insurance has been verified to be:  Medicare  A/B  Your primary doctor is:  Deland Pretty  Pertinent information will be shared with your doctor and your insurance company.  Inpatient Rehabilitation Care Coordinator:  Cathleen Corti [phone] or ([phone]  Information discussed with and copy given to patient by: Rana Snare, 08/23/2019, 10:34 AM

## 2019-08-23 NOTE — Progress Notes (Signed)
Physical Therapy Session Note  Patient Details  Name: [name] MRN: [id number] Date of Birth: Jun 06, 1934  Today's Date: 08/23/2019 PT Individual Time: 1001-1059 PT Individual Time Calculation (min): 58 min   Short Term Goals: Week 1:  PT Short Term Goal 1 (Week 1): Pt will ambulate 36f w safe use of /LRAD and min assist PT Short Term Goal 2 (Week 1): Pt will ascend/descend one 5in step w/single rail w/min assist PT Short Term Goal 3 (Week 1): bed to/from wc w/min assist and safe use of LRAD PT Short Term Goal 4 (Week 1): Pt will propel hemiheight wc up to 532fw/bilat LEs and min assist  Skilled Therapeutic Interventions/Progress Updates:     [facility]. Reports pain in R shoulder and low back. RN present and providing pt with muscle relaxer and PT provides repositioning and rest breaks as needed.  WC transport to therapy gym. PT discusses home setup with pt and strategies for safe entry into home considering steps in front and back with HR and different sides.   Pt ambulates 180' with SPC and CGA. Wide BOS and increased postural sway but no overt LOBs. Pt slightly SOB but primary complaint following ambulation is fatigue in BLEs.  Pt performs Nustep for strength and endurance training. Workload of 2, taking breaks at 3:00 and 5:00 due to fatigue. Performs 7:30 total. Pt says back being aggravated by movement.  Pt performs stand step [facility] and CGA. Left seated in WC with alarm intact and all needs within reach.  Therapy Documentation Precautions:  Precautions Precautions: Fall, Shoulder Type of Shoulder Precautions: per chart/ortho note pendulums; NWB RUE Shoulder Interventions: Shoulder sling/immobilizer, Off for dressing/bathing/exercises Precaution Comments: NWB RUE per MD note Required Braces or Orthoses: Sling Restrictions Weight Bearing Restrictions: Yes RUE Weight Bearing: Non weight bearing Other  Position/Activity Restrictions: RUE per chart    Therapy/Group: Individual Therapy  WiBreck CoonsPT, DPT 08/23/2019, 3:47 PM

## 2019-08-23 NOTE — Progress Notes (Signed)
Patient ID: [name], female   DOB: 16-Aug-1934, 85 y.o.   MRN: [id number]  SW left message for pt son Rowe Robert ([phone]) for courtesy phone call to introduce self, explain role, and discuss d/c process. Sw informed will follow-up with updates after team conference.   Loralee Pacas, MSW, [place] Office: [phone] Cell: [phone] Fax: [phone]

## 2019-08-23 NOTE — Progress Notes (Addendum)
ANTICOAGULATION CONSULT NOTE  Pharmacy Consult for Warfarin Indication: atrial fibrillation  Patient Measurements: Height: 5' 2"  (157.5 cm) Weight: 86.4 kg (190 lb 7.6 oz) IBW/kg (Calculated) : 50.1  Vital Signs: Temp: 97.9 F (36.6 C) (07/12 0334) Temp Source: Oral (07/12 0334) BP: 108/46 (07/12 0334) Pulse Rate: 61 (07/12 0334)  Labs: Recent Labs    08/21/19 0616 08/22/19 0612 08/23/19 0911 08/23/19 1150  HGB  --  8.4* 9.7*  --   HCT  --  29.0* 34.2*  --   PLT  --  167 153  --   LABPROT 32.7* 35.0*  --  29.6*  INR 3.3* 3.6*  --  2.9*  CREATININE 1.13* 1.01* 1.13*  --     Estimated Creatinine Clearance: 38.5 mL/min (A) (by C-G formula based on SCr of 1.13 mg/dL (H)).   Assessment: 85 yr old on warfarin for Afib, admitted for HF exacerbation. Eating 80-100% of meals (improving from last couple of days). H/H, plt stable. INR high on home dose. Patient has no bleeding but some bruising--not new per patient. She states she is usually stable on home regimen. Eating fewer greens while inpatient vs fluid status changes may be contributing.   INR down to therapeutic after decreased dose and holding x2 days. Will try to schedule lower dose.   Home warfarin: 2.5 mg po daily, except for 3.75 mg po on Monday, Friday (pt reported taking 3 mg on 08/14/19 but has 2.64m tablets at home)  Goal of Therapy:  INR 2-3 Monitor platelets by anticoagulation protocol: Yes   Plan:  Schedule Warfarin 252mdaily  Monitor daily INR, CBC, decrease frequency when stable  Monitor for signs/symptoms of bleeding Suggest discontinuing home cimetidine and continue famotidine at discharge due to drug interaction with warfarin  LyBenetta SparPharmD, BCPS, BCAtkinsonharmacist  Please check AMION for all [place] numbers After 10:00 PM, call [place]

## 2019-08-23 NOTE — Progress Notes (Signed)
[place] PHYSICAL MEDICINE & REHABILITATION PROGRESS NOTE   Subjective/Complaints:  Pt reports she's having R shoulder and mid back muscle spasms- very painful- is pretty new- also describes as grabbing pain- wants to try a muscle relaxant that's "light".   Also asking about CXR (already discussed with Dr Posey Pronto from 7/8), INR and Hb  ROS:  Pt denies SOB, abd pain, CP, N/V/C/D, and vision changes   Objective:   No results found. Recent Labs    08/22/19 0612 08/23/19 0911  WBC 4.5 5.5  HGB 8.4* 9.7*  HCT 29.0* 34.2*  PLT 167 153   Recent Labs    08/22/19 0612 08/23/19 0911  NA 141 140  K 3.4* 4.4  CL 98 100  CO2 33* 29  GLUCOSE 111* 132*  BUN 22 24*  CREATININE 1.01* 1.13*  CALCIUM 8.5* 9.0    Intake/Output Summary (Last 24 hours) at 08/23/2019 1934 Last data filed at 08/23/2019 1903 Gross per 24 hour  Intake 604 ml  Output --  Net 604 ml     Physical Exam: Vital Signs Blood pressure 138/74, pulse 67, temperature 97.9 F (36.6 C), temperature source Oral, resp. rate 18, height 5' 2"  (1.575 m), weight 86.4 kg, SpO2 99 %. Constitutional: No distress . Vital signs reviewed.  Larger woman sitting up in bed- c/o back and shoulder muscle spasms, NAD HENT: Normocephalic.  Atraumatic. Eyes: conjugate gaze Cardiovascular: RRR Respiratory: CTA B/L- no W/R/R- good air movement- wearing O2 by [place] GI: Soft, NT, ND, (+)BS  Skin: Left forearm ecchymosis Left wrist with dressing C/D/I Psych: frustrated Musc: No edema in extremities.  No tenderness in extremities. TTP over mid to low back and entire R shoulder/upper traps Neuro: Alert Motor: RUE: Limited by sling proximally, handgrip 4+/5 LUE: 5/5 proximal distal RLE: 4+/5 proximal distally, stable LLE: 5/5 proximal to distal Lots of bruising scattered across back  Assessment/Plan: 1. Functional deficits secondary to physical debility which require 3+ hours per day of interdisciplinary therapy in a comprehensive  inpatient rehab setting.  Physiatrist is providing close team supervision and 24 hour management of active medical problems listed below.  Physiatrist and rehab team continue to assess barriers to discharge/monitor patient progress toward functional and medical goals  Care Tool:  Bathing    Body parts bathed by patient: Right arm, Chest, Abdomen, Right upper leg, Left upper leg, Face, Front perineal area   Body parts bathed by helper: Left arm, Buttocks, Right lower leg, Left lower leg     Bathing assist Assist Level: Moderate Assistance - Patient 50 - 74%     Upper Body Dressing/Undressing Upper body dressing   What is the patient wearing?: Pull over shirt    Upper body assist Assist Level: Moderate Assistance - Patient 50 - 74%    Lower Body Dressing/Undressing Lower body dressing      What is the patient wearing?: Incontinence brief, Pants     Lower body assist Assist for lower body dressing: Moderate Assistance - Patient 50 - 74%     Toileting Toileting    Toileting assist Assist for toileting: Moderate Assistance - Patient 50 - 74%     Transfers Chair/bed transfer  Transfers assist     Chair/bed transfer assist level: Minimal Assistance - Patient > 75%     Locomotion Ambulation   Ambulation assist      Assist level: Contact Guard/Touching assist Assistive device: Cane-straight Max distance: 180'   Walk 10 feet activity   Assist  Assist level: Contact Guard/Touching assist Assistive device: Cane-straight   Walk 50 feet activity   Assist Walk 50 feet with 2 turns activity did not occur: Safety/medical concerns  Assist level: Contact Guard/Touching assist Assistive device: Cane-straight    Walk 150 feet activity   Assist Walk 150 feet activity did not occur: Safety/medical concerns  Assist level: Contact Guard/Touching assist Assistive device: Cane-straight    Walk 10 feet on uneven surface  activity   Assist Walk 10  feet on uneven surfaces activity did not occur: Safety/medical concerns         Wheelchair     Assist Will patient use wheelchair at discharge?: No             Wheelchair 50 feet with 2 turns activity    Assist            Wheelchair 150 feet activity     Assist          Blood pressure 138/74, pulse 67, temperature 97.9 F (36.6 C), temperature source Oral, resp. rate 18, height 5' 2"  (1.575 m), weight 86.4 kg, SpO2 99 %.  Medical Problem List and Plan: 1.  Balance deficits, DOE secondary to debility from CHF exacerbation, with recent history of right shoulder and left femur fracture.  Continue CIR 2.  Antithrombotics: -DVT/anticoagulation:  Pharmaceutical: Coumadin (goal 2-3).   INR supratherapeutic on 7/11  7/12- INR 2.9 - per pharmacy             -antiplatelet therapy: N/A 3. Chronic back pain/Fibromyalgia/Pain Management: Per Dr. Royanne Foots hydrocodone prn at home              Monitor with increased exertion  Scheduled Tylenol  Controlled with meds on 7/11 4. Mood: LCSW to follow for evaluation and support.              -antipsychotic agents: N/A 5. Neuropsych: This patient is capable of making decisions on her own behalf. 6. Skin/Wound Care: Routine pressure relief measures.  Added nystatin for candida skin folds.  7. Fluids/Electrolytes/Nutrition: Monitor I/O. Strict I/Os.   8. Acute on chronic CHF: On Bystolic and Lasix --intolerant of statins.  Daily weights with strict I/Od and HH diet. Now on lasix once daily.  Filed Weights   08/21/19 0505 08/22/19 0430 08/23/19 0334  Weight: 85.8 kg 87 kg 86.4 kg   7/12- weight down to 86.4 kg 9.  OSA: Has been refusing CPAP--important to help with respiratory/cardiac status. Husband to bring in her mask.   Supplemental oxygen dependent PTA, continue 10. GERD: Symptoms worse since surgery but have been better since hospitalization.  Continue Pepcid (used Tagamet PTA). Resumed probiotic. 11. HTN:  Monitor BP--continue Norvasc and Lasix.              Monitor with increased mobility.  7/12- BP well controlled- con't regimen 12. Chronic hypomagnesemia: Does not like to use supplements due to SE of diarrhea.   Magnesium improved with IV mag to 1.9 on 7/9 13. ABLA: Added iron supplement.            7/12- Hb 9.7- likely due to increase in Cr 14. Right humerus fx: NWB RUE with sling. Pendulum exercises and AAROM with PT  15. Morbid obesity: Encouraged weight loss. 16.  Sleep disturbance: Continue melatonin 52m HS  Improving 17.  AKI  Creatinine 1.01 on 7/11   Encourage fluids  7/12- Cr up to 1.13- was on 7/10- when forgets to push fluids, rises.   Continue  to monitor 18. Muscle spasms  7/12- will add Robaxin 500 mg q6 hours prn   LOS: 5 days A FACE TO FACE EVALUATION WAS PERFORMED  Mayre Bury 08/23/2019, 7:34 PM

## 2019-08-23 NOTE — Plan of Care (Signed)
  Problem: RH BLADDER ELIMINATION Goal: RH STG MANAGE BLADDER WITH ASSISTANCE Description: STG Manage Bladder With Assistance Min Outcome: Progressing   Problem: RH SKIN INTEGRITY Goal: RH STG SKIN FREE OF INFECTION/BREAKDOWN Description: Skin remains free of breakdown and infection with mod assist Outcome: Progressing   Problem: RH PAIN MANAGEMENT Goal: RH STG PAIN MANAGED AT OR BELOW PT'S PAIN GOAL Description: Less than 4 Outcome: Progressing   Problem: RH KNOWLEDGE DEFICIT GENERAL Goal: RH STG INCREASE KNOWLEDGE OF SELF CARE AFTER HOSPITALIZATION Description: Patient will be able to verbalize medication, diet management of CHF with cues/handouts Outcome: Progressing

## 2019-08-24 ENCOUNTER — Inpatient Hospital Stay (HOSPITAL_COMMUNITY): Payer: Medicare Other | Admitting: Occupational Therapy

## 2019-08-24 ENCOUNTER — Inpatient Hospital Stay (HOSPITAL_COMMUNITY): Payer: Medicare Other

## 2019-08-24 LAB — BASIC METABOLIC PANEL
Anion gap: 10 (ref 5–15)
BUN: 23 mg/dL (ref 8–23)
CO2: 32 mmol/L (ref 22–32)
Calcium: 9 mg/dL (ref 8.9–10.3)
Chloride: 98 mmol/L (ref 98–111)
Creatinine, Ser: 0.93 mg/dL (ref 0.44–1.00)
GFR calc Af Amer: >60 mL/min (ref >60)
GFR calc non Af Amer: 57 mL/min — ABNORMAL LOW (ref >60)
Glucose, Bld: 117 mg/dL — ABNORMAL HIGH (ref 70–99)
Potassium: 4 mmol/L (ref 3.5–5.1)
Sodium: 140 mmol/L (ref 135–145)

## 2019-08-24 LAB — PROTIME-INR
INR: 2.9 — ABNORMAL HIGH (ref 0.8–1.2)
Prothrombin Time: 29 seconds — ABNORMAL HIGH (ref 11.4–15.2)

## 2019-08-24 MED ORDER — METAXALONE 800 MG PO TABS
800.0000 mg | ORAL_TABLET | Freq: Four times a day (QID) | ORAL | Status: DC | PRN
  Filled 2019-08-24: qty 1

## 2019-08-24 NOTE — Progress Notes (Signed)
Physical Therapy Session Note  Patient Details  Name: [name] MRN: [id number] Date of Birth: 1934-06-15  Today's Date: 08/24/2019 PT Individual Time: 1323-1401 PT Individual Time Calculation (min): 38 min   Short Term Goals: Week 1:  PT Short Term Goal 1 (Week 1): Pt will ambulate 37f w safe use of /LRAD and min assist PT Short Term Goal 2 (Week 1): Pt will ascend/descend one 5in step w/single rail w/min assist PT Short Term Goal 3 (Week 1): bed to/from wc w/min assist and safe use of LRAD PT Short Term Goal 4 (Week 1): Pt will propel hemiheight wc up to 533fw/bilat LEs and min assist  Skilled Therapeutic Interventions/Progress Updates:  Pt seen to make up missed time from previous session. Pt received in w/c & agreeable to tx, reporting need to use restroom. Sit<>stands with close supervision and gait [facility], in bathroom with CGA 2/2 incline at bathroom door. In bathroom, [facility] holds to wall/grab bar. Pt requires assistance for clothing management & peri hygiene 2/2 RUE precautions. Toilet transfer with grab bar & supervision & pt with continent void & BM on toilet. Pt stands to perform hand hygiene at sink with CGA. Gait x 170 ft [facility] close supervision with antalgic gait & decreased gait speed with pt reporting 8/10 fatigue after walk & rest break provided. Pt performs seated hip adduction pillow squeezes & hip abduction with green theraband for BLE strengthening. At end of session pt left sitting in w/c with chair alarm donned, call bell & all needs in reach.   Therapy Documentation Precautions:  Precautions Precautions: Fall, Shoulder Type of Shoulder Precautions: per chart/ortho note pendulums; NWB RUE Shoulder Interventions: Shoulder sling/immobilizer, Off for dressing/bathing/exercises Precaution Comments: NWB RUE per MD note Required Braces or Orthoses: Sling Restrictions Weight Bearing Restrictions: Yes RUE Weight Bearing:  Non weight bearing Other Position/Activity Restrictions: RUE per chart  Vital Signs: Pt on 1.5 L/min at rest when attached to wall, 2L/min supplemental oxygen when on portable oxygen tank 2/2 setting limitations, via nasal cannula; SpO2 >90% throughout.  After gait HR = 103 bpm decreased to 86 bpm  Pain: R shoulder 5/10 "I pretty much stay that way. My shoulder hurts all the time." pt also c/o unrated back pain - rest breaks provided PRN.   Therapy/Group: Individual Therapy  [name]/13/2021, 2:03 PM

## 2019-08-24 NOTE — Progress Notes (Signed)
[place] for Warfarin Indication: atrial fibrillation  Patient Measurements: Height: 5' 2"  (157.5 cm) Weight: 86.4 kg (190 lb 7.6 oz) IBW/kg (Calculated) : 50.1  Vital Signs: Temp: 98.3 F (36.8 C) (07/13 0429) BP: 137/69 (07/13 0414) Pulse Rate: 82 (07/13 0414)  Labs: Recent Labs    08/22/19 0612 08/23/19 0911 08/23/19 1150 08/24/19 0615  HGB 8.4* 9.7*  --   --   HCT 29.0* 34.2*  --   --   PLT 167 153  --   --   LABPROT 35.0*  --  29.6* 29.0*  INR 3.6*  --  2.9* 2.9*  CREATININE 1.01* 1.13*  --  0.93    Estimated Creatinine Clearance: 46.7 mL/min (by C-G formula based on SCr of 0.93 mg/dL).   Assessment: 85 yr old on warfarin for Afib, admitted for HF exacerbation. Eating 80-100% of meals (improving from last couple of days). H/H, plt stable. INR high on home dose. Patient has no bleeding but some bruising--not new per patient. She states she is usually stable on home regimen. Eating fewer greens while inpatient vs fluid status changes may be contributing.   INR stable at 2.9. On scheduled lower dose. No reported bleeding.   Home warfarin: 2.5 mg po daily, except for 3.75 mg po on Monday, Friday (pt reported taking 3 mg on 08/14/19 but has 2.57m tablets at home)  Goal of Therapy:  INR 2-3 Monitor platelets by anticoagulation protocol: Yes   Plan:  Schedule Warfarin 24mdaily  Monitor daily INR, CBC, decrease frequency when stable  Monitor for signs/symptoms of bleeding Suggest discontinuing home cimetidine and continue famotidine at discharge due to drug interaction with warfarin  LyBenetta SparPharmD, BCPS, BCBlaineharmacist  Please check AMION for all M[place]hone numbers After 10:00 PM, call [place]

## 2019-08-24 NOTE — Progress Notes (Signed)
Pt has refused cpap for the night.  She is currently using [place] and happy for now.  RT will continue to monitor.

## 2019-08-24 NOTE — Plan of Care (Signed)
  Problem: Consults Goal: RH GENERAL PATIENT EDUCATION Description: See Patient Education module for education specifics. Outcome: Progressing   Problem: RH BLADDER ELIMINATION Goal: RH STG MANAGE BLADDER WITH ASSISTANCE Description: STG Manage Bladder With Assistance Min Outcome: Progressing   Problem: RH SKIN INTEGRITY Goal: RH STG SKIN FREE OF INFECTION/BREAKDOWN Description: Skin remains free of breakdown and infection with mod assist Outcome: Progressing   Problem: RH PAIN MANAGEMENT Goal: RH STG PAIN MANAGED AT OR BELOW PT'S PAIN GOAL Description: Less than 4 Outcome: Progressing   Problem: RH KNOWLEDGE DEFICIT GENERAL Goal: RH STG INCREASE KNOWLEDGE OF SELF CARE AFTER HOSPITALIZATION Description: Patient will be able to verbalize medication, diet management of CHF with cues/handouts Outcome: Progressing

## 2019-08-24 NOTE — Progress Notes (Signed)
Physical Therapy Session Note  Patient Details  Name: [name] MRN: [id number] Date of Birth: 1934/12/10  Today's Date: 08/24/2019 PT Individual Time: 0805-0916 PT Individual Time Calculation (min): 71 min   Short Term Goals: Week 1:  PT Short Term Goal 1 (Week 1): Pt will ambulate 78f w safe use of /LRAD and min assist PT Short Term Goal 2 (Week 1): Pt will ascend/descend one 5in step w/single rail w/min assist PT Short Term Goal 3 (Week 1): bed to/from wc w/min assist and safe use of LRAD PT Short Term Goal 4 (Week 1): Pt will propel hemiheight wc up to 546fw/bilat LEs and min assist  Skilled Therapeutic Interventions/Progress Updates:     [facility]. Reports that most discomfort is in back. 7/10 in severity. PT provides rest breaks as needed to manage pain symptoms.   Pt ambulates 100' x2 with CGA and SPC, requiring extended seated rest break in between. Pt has wide BOS and decreased gait speed. PT cues for upright gaze and increasing gait speed to decrease risk for falls.  Pt practices forward and backward gait to challenge balance, requiring CGA, and ambulating 10' in each direction x2.   Pt performs stair training in // bars, using 4 inch step. Pt completes 3 bouts of x10 step ups, leading with LLE, requiring minA from PT for sequencing and facilitating weight shifting. Left seated in WC with alarm intact and all needs within reach.  Therapy Documentation Precautions:  Precautions Precautions: Fall, Shoulder Type of Shoulder Precautions: per chart/ortho note pendulums; NWB RUE Shoulder Interventions: Shoulder sling/immobilizer, Off for dressing/bathing/exercises Precaution Comments: NWB RUE per MD note Required Braces or Orthoses: Sling Restrictions Weight Bearing Restrictions: Yes RUE Weight Bearing: Non weight bearing Other Position/Activity Restrictions: RUE per chart    Therapy/Group: Individual Therapy  WiBreck Coons PT, DPT 08/24/2019, 4:01 PM

## 2019-08-24 NOTE — Progress Notes (Signed)
Patient refused CPAP HS tonight. Patient wants to wear her [place] tonight. Patient been tolerating well.

## 2019-08-24 NOTE — Patient Care Conference (Signed)
Inpatient RehabilitationTeam Conference and Plan of Care Update Date: 08/24/2019   Time: 4:41 PM    Patient Name: [name]      Medical Record Number: [id number]  Date of Birth: November 21, 1934 Sex: Female         Room/Bed: 4M07C/4M07C-01 Payor Info: Payor: MEDICARE / Plan: MEDICARE PART A AND B / Product Type: *No Product type* /    Admit Date/Time:  08/18/2019  5:16 PM  Primary Diagnosis:  Physical debility  Hospital Problems: Principal Problem:   Physical debility Active Problems:   AKI (acute kidney injury) ([place])   Sleep disturbance   Acute on chronic combined systolic and diastolic heart failure ([place])   Postoperative pain   Supplemental oxygen dependent   Chronic pain syndrome   Supratherapeutic INR    Expected Discharge Date: Expected Discharge Date: 09/03/19  Team Members Present: Physician leading conference: Dr. Courtney Heys Care Coodinator Present: Loralee Pacas, LCSWA;Daylene Vandenbosch Creig Hines, RN, BSN, CRRN Nurse Present: Serena Croissant, LPN PT Present: Tereasa Coop, PT OT Present: Lillia Corporal, OT PPS Coordinator present : Ileana Ladd, Burna Mortimer, SLP     Current Status/Progress Goal Weekly Team Focus  Bowel/Bladder   continent of bowel and bladder LBM 7/12  Remain continent.  assess toileting neeeds qshift and PRN   Swallow/Nutrition/ Hydration             ADL's   Min-Mod for LB, introduced AE but will continue to train, Min-Mod for clothing management, Mod for LB dressing, toilet transfers CGA/Min with grab bar  CGA-Min  ADL transfers, LB bathing/dressing with AE, endurance/activity tolerance   Mobility   min to modA bed mobility, minA/CGA sit to stand, CGA gait up to 150' with SPC. Still appears very unsteady. Need to work on IT trainer.  CGA  bed mobility, balance, ambulation, activity tolerance, stairs   Communication             Safety/Cognition/ Behavioral Observations            Pain   c/o pain and spasms to lower back and right arm. relieved  with scheduled Tylenol TID.robaxdin q6 PRN and Narco q6 PRN  pain less than 3  assess pain qshift and PRN   Skin   MASD to groin treated with barrier cream  keep skin dry  assess skin qshift and PRN     Team Discussion:  Discharge Planning/Teaching Needs:  D/c to home with support from her husband. PRN support from their adult son.  Family education as recommended by therapy   Current Update:  none  Current Barriers to Discharge:  Weight bearing restrictions and Pain and stair training.  Possible Resolutions to Barriers: Continue to educate patient on weight bearing restrictions, continue pain management regimen, PT to begin stair training in preparation for discharge goals.   Patient on target to meet rehab goals: yes, continent B/B, weaning O2 to 2L, [place], min-mod assist using adaptive equipment for ADL's. Ambulation improving with SPC.   *See Care Plan and progress notes for long and short-term goals.   Revisions to Treatment Plan:  Order placed to discontinue IV.    Medical Summary Current Status: had robaxin- better for pain than Norco right now- down to 2L of O2- contient- LBM 7/11- needs to have BM Weekly Focus/Goal: OT- trouble with pants/brief- cannot reach to R side due to UE fx- husband can help, per pt!- min-mod A level ADLs- a lot of back pain  Barriers to Discharge: Decreased family/caregiver support;Home enviroment access/layout;Other (  comments);Weight;Weight bearing restrictions  Barriers to Discharge Comments: On O2 at home- currently on 2L- 7/23 for d/c Possible Resolutions to Barriers: PT_ min A sit-stand- CGA 150 ft SPC- does appear nervous,a little unsteady- stairs are barrier- has 5-7 stairs-   Continued Need for Acute Rehabilitation Level of Care: The patient requires daily medical management by a physician with specialized training in physical medicine and rehabilitation for the following reasons: Direction of a multidisciplinary physical rehabilitation  program to maximize functional independence : Yes Medical management of patient stability for increased activity during participation in an intensive rehabilitation regime.: Yes Analysis of laboratory values and/or radiology reports with any subsequent need for medication adjustment and/or medical intervention. : Yes   I attest that I was present, lead the team conference, and concur with the assessment and plan of the team.   Cristi Loron 08/24/2019, 4:41 PM

## 2019-08-24 NOTE — Progress Notes (Addendum)
Patient ID: [name], female   DOB: 11/03/1934, 85 y.o.   MRN: [id number]  SW met with pt in room to provide updates from team conference, and d/c date 7/23. SW discussed family education next week. Pt scheduled family edu on 7/21 1pm-3pm for her husband to be present, and stated she would follow-up with him about it.    *HHPT/OT/SN referral accepted by [facility].   Loralee Pacas, MSW, [place] Office: [phone] Cell: [phone] Fax: [phone]

## 2019-08-24 NOTE — Progress Notes (Signed)
Occupational Therapy Session Note  Patient Details  Name: [name] MRN: [id number] Date of Birth: 04/18/1934  Today's Date: 08/24/2019 OT Individual Time: 3094-0768 and 0881-1031 OT Individual Time Calculation (min): 35 min and 46 min Missed 25 mins of OT due to pain and fatigue  Short Term Goals: Week 1:  OT Short Term Goal 1 (Week 1): Pt will completed toileting 3/3 steps with min assist. OT Short Term Goal 2 (Week 1): Pt will complete UB bathing with min assist. OT Short Term Goal 3 (Week 1): Pt will complete LB dressing with AE as needed with mod assist. OT Short Term Goal 4 (Week 1): Pt will complete UB dressing with min assist.  Skilled Therapeutic Interventions/Progress Updates:    Session 1: Pt greeted at time of session sitting up in wheelchair, pt in pain in R shoulder and low back, not relieved by small hot pack from nursing. Attempted repositioning of pelvis to promote upright sitting and prevent posterior pelvic tilt, mildly relieved discomfort but not entirely. Applied hot pack to low back for 10 mins, skin intact pre and post hot pack, pt with less c/o pain after hot pack but difficulty staying awake/very lethargic sitting up in chair. Reviewed AE use with reacher for threading pants when going home, pt receptive to education and demonstration. Declined all ADLs today d/t pain and fatigue, requesting to lay down in bed. Stand pivot wheelchair to bed with CGA/Min with hand held assist, sit to supine supervision and scooted up in bed the same manner. Pt resting in bed supine with HOB elevated on O2, R shoulder properly positioned with pillows to prevent extension, alarm on, call bell in reach, all needs met. 25 mins missed d/t fatigue and pain.  Session 2: Pt greeted at time of session sitting up in wheelchair just finishing PT session, c/o discomfort under R breast and need to wash/cleanse. Nursing made aware of pain and irritation. Mod A to wash under breast for thoroughness d/t  pt only has use of LUE at this time and could not lift breast and wash. Powder applied as well to decrease moisture/risk of skin breakdown. UB dressing donning/doffing with Min A, pt ed on one handed techniques. SPT wheelchair to bed Min/CGA with hand held assist and sit to supine Min A d/t fatigue and therapist performed PROM for shoulder flexion and external rotation according to protocol (in her room on bathroom door). Pt declined getting back in chair, positioned for comfort and to maintain precautions. Alarm on, call bell in reach, all needs met.    Therapy Documentation Precautions:  Precautions Precautions: Fall, Shoulder Type of Shoulder Precautions: per chart/ortho note pendulums; NWB RUE Shoulder Interventions: Shoulder sling/immobilizer, Off for dressing/bathing/exercises Precaution Comments: NWB RUE per MD note Required Braces or Orthoses: Sling Restrictions Weight Bearing Restrictions: Yes RUE Weight Bearing: Non weight bearing Other Position/Activity Restrictions: RUE per chart    Therapy/Group: Individual Therapy  Viona Gilmore 08/24/2019, 12:07 PM

## 2019-08-24 NOTE — Progress Notes (Signed)
[name] PHYSICAL MEDICINE & REHABILITATION PROGRESS NOTE   Subjective/Complaints:  Pt reports muscle spasms somewhat better- just put back on O2 by [place] since moved to w/c.  Vomite dup breakfast- "never does this". Thinks because wasn't sitting all the way up.  2 BM this AM- diarrhea per pt- due to iron/Lasix.    ROS:   Pt denies SOB, abd pain, CP, N/V/C/D, and vision changes   Objective:   No results found. Recent Labs    08/22/19 0612 08/23/19 0911  WBC 4.5 5.5  HGB 8.4* 9.7*  HCT 29.0* 34.2*  PLT 167 153   Recent Labs    08/23/19 0911 08/24/19 0615  NA 140 140  K 4.4 4.0  CL 100 98  CO2 29 32  GLUCOSE 132* 117*  BUN 24* 23  CREATININE 1.13* 0.93  CALCIUM 9.0 9.0    Intake/Output Summary (Last 24 hours) at 08/24/2019 0919 Last data filed at 08/24/2019 0845 Gross per 24 hour  Intake 504 ml  Output --  Net 504 ml     Physical Exam: Vital Signs Blood pressure (!) 118/58, pulse 74, temperature 98.3 F (36.8 C), resp. rate 15, height 5' 2"  (1.575 m), weight 86.4 kg, SpO2 97 %. Constitutional: No distress . Vital signs reviewed.  Pt sitting up in manual w/c, NT in room; on O2 by [place], NAD HENT: Normocephalic.  Atraumatic. Eyes: conjugate gaze Cardiovascular: RRR Respiratory: CTA B/L- no W/R/R- good air movement- on O2 by [place] GI: Soft, NT, ND, (+)BS  Skin: Left forearm ecchymosis Left wrist with dressing C/D/I Psych: appropriate Musc: No edema in extremities.  No tenderness in extremities. TTP over mid to low back and entire R shoulder/upper traps Neuro: Alert Motor: RUE: Limited by sling proximally, handgrip 4+/5 LUE: 5/5 proximal distal RLE: 4+/5 proximal distally, stable LLE: 5/5 proximal to distal Lots of bruising scattered across back- no change  Assessment/Plan: 1. Functional deficits secondary to physical debility which require 3+ hours per day of interdisciplinary therapy in a comprehensive inpatient rehab setting.  Physiatrist is providing  close team supervision and 24 hour management of active medical problems listed below.  Physiatrist and rehab team continue to assess barriers to discharge/monitor patient progress toward functional and medical goals  Care Tool:  Bathing    Body parts bathed by patient: Right arm, Chest, Abdomen, Right upper leg, Left upper leg, Face, Front perineal area   Body parts bathed by helper: Left arm, Buttocks, Right lower leg, Left lower leg     Bathing assist Assist Level: Moderate Assistance - Patient 50 - 74%     Upper Body Dressing/Undressing Upper body dressing   What is the patient wearing?: Pull over shirt    Upper body assist Assist Level: Moderate Assistance - Patient 50 - 74%    Lower Body Dressing/Undressing Lower body dressing      What is the patient wearing?: Incontinence brief, Pants     Lower body assist Assist for lower body dressing: Moderate Assistance - Patient 50 - 74%     Toileting Toileting    Toileting assist Assist for toileting: Moderate Assistance - Patient 50 - 74%     Transfers Chair/bed transfer  Transfers assist     Chair/bed transfer assist level: Minimal Assistance - Patient > 75%     Locomotion Ambulation   Ambulation assist      Assist level: Contact Guard/Touching assist Assistive device: Cane-straight Max distance: 180'   Walk 10 feet activity   Assist  Assist level: Contact Guard/Touching assist Assistive device: Cane-straight   Walk 50 feet activity   Assist Walk 50 feet with 2 turns activity did not occur: Safety/medical concerns  Assist level: Contact Guard/Touching assist Assistive device: Cane-straight    Walk 150 feet activity   Assist Walk 150 feet activity did not occur: Safety/medical concerns  Assist level: Contact Guard/Touching assist Assistive device: Cane-straight    Walk 10 feet on uneven surface  activity   Assist Walk 10 feet on uneven surfaces activity did not occur:  Safety/medical concerns         Wheelchair     Assist Will patient use wheelchair at discharge?: No             Wheelchair 50 feet with 2 turns activity    Assist            Wheelchair 150 feet activity     Assist          Blood pressure (!) 118/58, pulse 74, temperature 98.3 F (36.8 C), resp. rate 15, height 5' 2"  (1.575 m), weight 86.4 kg, SpO2 97 %.  Medical Problem List and Plan: 1.  Balance deficits, DOE secondary to debility from CHF exacerbation, with recent history of right shoulder and left femur fracture.  Continue CIR 2.  Antithrombotics: -DVT/anticoagulation:  Pharmaceutical: Coumadin (goal 2-3).   INR supratherapeutic on 7/11  7/13- INR 2.9 still today- per pharmacy             -antiplatelet therapy: N/A 3. Chronic back pain/Fibromyalgia/Pain Management: Per Dr. Royanne Foots hydrocodone prn at home              Monitor with increased exertion  Scheduled Tylenol  7/13- added robaxin prn - doing well/better 4. Mood: LCSW to follow for evaluation and support.              -antipsychotic agents: N/A 5. Neuropsych: This patient is capable of making decisions on her own behalf. 6. Skin/Wound Care: Routine pressure relief measures.  Added nystatin for candida skin folds.  7. Fluids/Electrolytes/Nutrition: Monitor I/O. Strict I/Os.   8. Acute on chronic CHF: On Bystolic and Lasix --intolerant of statins.  Daily weights with strict I/Od and HH diet. Now on lasix once daily.  Filed Weights   08/21/19 0505 08/22/19 0430 08/23/19 0334  Weight: 85.8 kg 87 kg 86.4 kg   7/12- weight down to 86.4 kg  7/13- no weight this AM 9.  OSA: Has been refusing CPAP--important to help with respiratory/cardiac status. Husband to bring in her mask.   Supplemental oxygen dependent PTA, continue 10. GERD: Symptoms worse since surgery but have been better since hospitalization.  Continue Pepcid (used Tagamet PTA). Resumed probiotic. 11. HTN: Monitor BP--continue  Norvasc and Lasix.              Monitor with increased mobility.  7/12- BP well controlled- con't regimen 12. Chronic hypomagnesemia: Does not like to use supplements due to SE of diarrhea.   Magnesium improved with IV mag to 1.9 on 7/9 13. ABLA: Added iron supplement.            7/12- Hb 9.7- likely due to increase in Cr 14. Right humerus fx: NWB RUE with sling. Pendulum exercises and AAROM with PT  15. Morbid obesity: Encouraged weight loss. 16.  Sleep disturbance: Continue melatonin 30m HS  Improving 17.  AKI  Creatinine 1.01 on 7/11   Encourage fluids  7/12- Cr up to 1.13- was on 7/10- when forgets  to push fluids, rises.   7/13- Cr 0.93- improved this AM  Continue to monitor 18. Muscle spasms  7/12- will add Robaxin 500 mg q6 hours prn  7/13- doing better this AM with robaxin   LOS: 6 days A FACE TO FACE EVALUATION WAS PERFORMED  Werner Labella 08/24/2019, 9:19 AM

## 2019-08-25 ENCOUNTER — Inpatient Hospital Stay (HOSPITAL_COMMUNITY): Payer: Medicare Other | Admitting: Occupational Therapy

## 2019-08-25 ENCOUNTER — Inpatient Hospital Stay (HOSPITAL_COMMUNITY): Payer: Medicare Other

## 2019-08-25 LAB — PROTIME-INR
INR: 2.6 — ABNORMAL HIGH (ref 0.8–1.2)
Prothrombin Time: 26.9 seconds — ABNORMAL HIGH (ref 11.4–15.2)

## 2019-08-25 NOTE — Progress Notes (Signed)
Physical Therapy Session Note  Patient Details  Name: [name] MRN: [id number] Date of Birth: December 04, 1934  Today's Date: 08/25/2019 PT Individual Time: 0805-0902 PT Individual Time Calculation (min): 57 min   Short Term Goals: Week 1:  PT Short Term Goal 1 (Week 1): Pt will ambulate 77f w safe use of /LRAD and min assist PT Short Term Goal 2 (Week 1): Pt will ascend/descend one 5in step w/single rail w/min assist PT Short Term Goal 3 (Week 1): bed to/from wc w/min assist and safe use of LRAD PT Short Term Goal 4 (Week 1): Pt will propel hemiheight wc up to 539fw/bilat LEs and min assist  Skilled Therapeutic Interventions/Progress Updates:     Pt received supine in bed and agreeable to therapy. Reports pain in R shoulder. Number not provided. PT provides rest breaks and positioning to manage pain.   Supine to sit with minA. Sitting at EOB, PT assists with upper and lower body dressing. Stand step to RW with CGA and no AD. WC transport to gym for time management.  Pt performs multiple bouts of ambulation to work on endurance, balance, and gait pattern. Abbreviated seated rest break provided to challenge cardiovascular and muscular endurance. Pt ambulates 100' x3 and 170' x1 with CGA and verbal cues for upright gaze and increased gait speed to decrease risk for falls. Pt verbalizes SOB and vitals monitored with O2 remaining greater than 95% throughout.   Pt left seated in WC with alarm intact and all needs within reach.  Therapy Documentation Precautions:  Precautions Precautions: Fall, Shoulder Type of Shoulder Precautions: per chart/ortho note pendulums; NWB RUE Shoulder Interventions: Shoulder sling/immobilizer, Off for dressing/bathing/exercises Precaution Comments: NWB RUE per MD note Required Braces or Orthoses: Sling Restrictions Weight Bearing Restrictions: Yes RUE Weight Bearing: Non weight bearing Other Position/Activity Restrictions: RUE per chart   Therapy/Group:  Individual Therapy  WiBreck CoonsPT, DPT 08/25/2019, 4:20 PM

## 2019-08-25 NOTE — Progress Notes (Signed)
Occupational Therapy Session Note  Patient Details  Name: [name] MRN: [id number] Date of Birth: [birth year]  Today's Date: 08/25/2019 OT Individual Time: 3267-1245 and 8099-8338 OT Individual Time Calculation (min): 58 min and 43 min   Short Term Goals: Week 1:  OT Short Term Goal 1 (Week 1): Pt will completed toileting 3/3 steps with min assist. OT Short Term Goal 2 (Week 1): Pt will complete UB bathing with min assist. OT Short Term Goal 3 (Week 1): Pt will complete LB dressing with AE as needed with mod assist. OT Short Term Goal 4 (Week 1): Pt will complete UB dressing with min assist.  Skilled Therapeutic Interventions/Progress Updates:    Pt greeted at time of session sitting up in wheelchair agreeable to OT session with mild c/o back pain, nursing aware. Pt stated she already washed up this morning so declined shower. Ambulated from wheelchair at bedside to toilet with [facility] with CGA and transferred to toilet in the same manner, Min for clothing management but assistance for hygiene d/t patient's limited UEs and inability to reach behind self, stating that she can normally wipe at home with R hand but cant reach with her L hand. Lb dressing with Min A with use of reacher to thread and pull up to knee level, therapist assist to don over R hip d/t body habitus and inability to reach over to R side. Pt brought down to apartment and performed TTB transfer with hand held assist with CGA/Min A, unable to fully get legs over tub d/t height but will plan to attempt again with lower tub. Pt transported back to room with wheelchair for time, set up with alarm, call bell in reach, all needs met.   Session 2: Pt greeted at time of session sitting up in wheelchair, agreeable to OT session. Pt ambulated with SPC to bathroom with CGA and transferred to toilet in same manner. Min/Mod for clothing management d/t fatigue and low back discomfort. Unable to perform hygiene as she is unable to reach to wipe  periarea or buttocks d/t body habitus and decreased arm length. Ambulated back to wheelchair and performed UB bathing Min A with therapist assist to hold up breast for pt to wash underneath d/t rash and helped apply powder to decrease moisture. Pt transferred wheelchair to bed CGA and sit to supine Min A d/t fatigue. Therapist performed RUE shoulder PROM according to protocol with pt in supine. Pt supine in bed with alarm on, call bell in reach, all needs met.   Therapy Documentation Precautions:  Precautions Precautions: Fall, Shoulder Type of Shoulder Precautions: per chart/ortho note pendulums; NWB RUE Shoulder Interventions: Shoulder sling/immobilizer, Off for dressing/bathing/exercises Precaution Comments: NWB RUE per MD note Required Braces or Orthoses: Sling Restrictions Weight Bearing Restrictions: Yes RUE Weight Bearing: Non weight bearing Other Position/Activity Restrictions: RUE per chart     Therapy/Group: Individual Therapy  Viona Gilmore 08/25/2019, 12:10 PM

## 2019-08-25 NOTE — Progress Notes (Signed)
[facility] PHYSICAL MEDICINE & REHABILITATION PROGRESS NOTE   Subjective/Complaints: No complaints this morning.  Husband will be here for family training on 7/21. INR 2.6 Continue Warfarin  ROS:   Pt denies SOB, abd pain, CP, N/V/C/D, and vision changes   Objective:   No results found. Recent Labs    08/23/19 0911  WBC 5.5  HGB 9.7*  HCT 34.2*  PLT 153   Recent Labs    08/23/19 0911 08/24/19 0615  NA 140 140  K 4.4 4.0  CL 100 98  CO2 29 32  GLUCOSE 132* 117*  BUN 24* 23  CREATININE 1.13* 0.93  CALCIUM 9.0 9.0    Intake/Output Summary (Last 24 hours) at 08/25/2019 1926 Last data filed at 08/25/2019 1853 Gross per 24 hour  Intake 744 ml  Output --  Net 744 ml     Physical Exam: Vital Signs Blood pressure (!) 146/86, pulse 92, temperature 98.1 F (36.7 C), resp. rate 18, height 5' 2"  (1.575 m), weight 87 kg, SpO2 97 %. Gen: no distress, normal appearing HEENT: oral mucosa pink and moist, NCAT Cardio: Reg rate Chest: normal effort, normal rate of breathing Abd: soft, non-distended Ext: no edema Musc: No edema in extremities.  No tenderness in extremities. TTP over mid to low back and entire R shoulder/upper traps Neuro: Alert Motor: RUE: Limited by sling proximally, handgrip 4+/5 LUE: 5/5 proximal distal RLE: 4+/5 proximal distally, stable LLE: 5/5 proximal to distal Skin: Lots of bruising scattered across back- no change. Left forearm ecchymosis Left wrist with dressing C/D/I  Assessment/Plan: 1. Functional deficits secondary to physical debility which require 3+ hours per day of interdisciplinary therapy in a comprehensive inpatient rehab setting.  Physiatrist is providing close team supervision and 24 hour management of active medical problems listed below.  Physiatrist and rehab team continue to assess barriers to discharge/monitor patient progress toward functional and medical goals  Care Tool:  Bathing    Body parts bathed by patient:  Right arm, Chest, Abdomen, Right upper leg, Left upper leg, Face, Front perineal area   Body parts bathed by helper: Left arm, Buttocks, Right lower leg, Left lower leg     Bathing assist Assist Level: Moderate Assistance - Patient 50 - 74%     Upper Body Dressing/Undressing Upper body dressing   What is the patient wearing?: Pull over shirt    Upper body assist Assist Level: Minimal Assistance - Patient > 75%    Lower Body Dressing/Undressing Lower body dressing      What is the patient wearing?: Incontinence brief, Pants     Lower body assist Assist for lower body dressing: Minimal Assistance - Patient > 75% (with reacher)     Toileting Toileting    Toileting assist Assist for toileting: Moderate Assistance - Patient 50 - 74%     Transfers Chair/bed transfer  Transfers assist     Chair/bed transfer assist level: Contact Guard/Touching assist     Locomotion Ambulation   Ambulation assist      Assist level: Contact Guard/Touching assist Assistive device: Cane-straight Max distance: 150'   Walk 10 feet activity   Assist     Assist level: Contact Guard/Touching assist Assistive device: Cane-straight   Walk 50 feet activity   Assist Walk 50 feet with 2 turns activity did not occur: Safety/medical concerns  Assist level: Contact Guard/Touching assist Assistive device: Cane-straight    Walk 150 feet activity   Assist Walk 150 feet activity did not occur: Safety/medical concerns  Assist level: Contact Guard/Touching assist Assistive device: Cane-straight    Walk 10 feet on uneven surface  activity   Assist Walk 10 feet on uneven surfaces activity did not occur: Safety/medical concerns         Wheelchair     Assist Will patient use wheelchair at discharge?: No             Wheelchair 50 feet with 2 turns activity    Assist            Wheelchair 150 feet activity     Assist          Blood pressure (!)  146/86, pulse 92, temperature 98.1 F (36.7 C), resp. rate 18, height 5' 2"  (1.575 m), weight 87 kg, SpO2 97 %.  Medical Problem List and Plan: 1.  Balance deficits, DOE secondary to debility from CHF exacerbation, with recent history of right shoulder and left femur fracture.  Continue CIR 2.  Antithrombotics: -DVT/anticoagulation:  Pharmaceutical: Coumadin (goal 2-3). 7/14: INR 2.6. Continue Coumadin as per pharmacy.              -antiplatelet therapy: N/A 3. Chronic back pain/Fibromyalgia/Pain Management: Dr. Royanne Foots hydrocodone prn at home              Monitor with increased exertion  Scheduled Tylenol  7/13- added robaxin prn - doing well/better  7/14: pain well controlled in therapy today. 4. Mood: LCSW to follow for evaluation and support.              -antipsychotic agents: N/A 5. Neuropsych: This patient is capable of making decisions on her own behalf. 6. Skin/Wound Care: Routine pressure relief measures.  Added nystatin for candida skin folds.  7. Fluids/Electrolytes/Nutrition: Monitor I/O. Strict I/Os.   8. Acute on chronic CHF: On Bystolic and Lasix --intolerant of statins.  Daily weights with strict I/Od and HH diet. Now on lasix once daily.  Filed Weights   08/23/19 0334 08/24/19 0500 08/25/19 0511  Weight: 86.4 kg 86.3 kg 87 kg   7/14: weight slightly increased. 9.  OSA: Has been refusing CPAP--important to help with respiratory/cardiac status. Husband to bring in her mask.   Supplemental oxygen dependent PTA, continue 10. GERD: Symptoms worse since surgery but have been better since hospitalization.  Continue Pepcid (used Tagamet PTA). Resumed probiotic. 11. HTN: Monitor BP--continue Norvasc and Lasix.              Monitor with increased mobility.  7/12- BP well controlled- con't regimen 12. Chronic hypomagnesemia: Does not like to use supplements due to SE of diarrhea.   Magnesium improved with IV mag to 1.9 on 7/9 13. ABLA: Added iron supplement.             7/12- Hb 9.7- likely due to increase in Cr 14. Right humerus fx: NWB RUE with sling. Pendulum exercises and AAROM with PT  15. Morbid obesity: Encouraged weight loss. 16.  Sleep disturbance: Continue melatonin 4m HS  Improving 17.  AKI  Creatinine 1.01 on 7/11   Encourage fluids  7/12- Cr up to 1.13- was on 7/10- when forgets to push fluids, rises.   7/13- Cr 0.93- improved this AM  Continue to monitor 18. Muscle spasms  7/12- will add Robaxin 500 mg q6 hours prn  7/13- doing better this AM with robaxin   LOS: 7 days A FACE TO FACE EVALUATION WAS PERFORMED  KClide DeutscherRaulkar 08/25/2019, 7:26 PM

## 2019-08-25 NOTE — Progress Notes (Signed)
Patient ID: [name], female   DOB: [birth year], 85 y.o.   MRN: [id number]  SW met with pt in room to confirm that she spoke with her husband about family edu. He will be here on 7/21 1pm-3pm. She mentioned that she would like a TTB after being in therapy today. SW explained this item is a IT trainer as not covered under insurance. SW informed pt on [facility] being [facility].   SW ordered TTB with [employer] via parachute.   Loralee Pacas, MSW, [place] Office: [phone] Cell: [phone] Fax: [phone]

## 2019-08-25 NOTE — Discharge Instructions (Signed)
COMMUNITY REFERRALS UPON DISCHARGE:    Home Health:   PT     OT    ?RN  ?SNA                     Agency: [facility] [employer]: [phone]     Medical Equipment/Items Ordered:                                                 Agency/Supplier:

## 2019-08-25 NOTE — Progress Notes (Signed)
ANTICOAGULATION CONSULT NOTE  Pharmacy Consult for Warfarin Indication: atrial fibrillation  Patient Measurements: Height: 5' 2"  (157.5 cm) Weight: 87 kg (191 lb 12.8 oz) IBW/kg (Calculated) : 50.1  Vital Signs: Temp: 98.1 F (36.7 C) (07/14 1326) BP: 146/86 (07/14 1326) Pulse Rate: 92 (07/14 1326)  Labs: Recent Labs    08/23/19 0911 08/23/19 1150 08/24/19 0615 08/25/19 0734  HGB 9.7*  --   --   --   HCT 34.2*  --   --   --   PLT 153  --   --   --   LABPROT  --  29.6* 29.0* 26.9*  INR  --  2.9* 2.9* 2.6*  CREATININE 1.13*  --  0.93  --     Estimated Creatinine Clearance: 47 mL/min (by C-G formula based on SCr of 0.93 mg/dL).   Assessment: 85 yr old on warfarin for Afib, admitted for HF exacerbation. Eating 80-100% of meals (improving from last couple of days). H/H, plt stable. INR high on home dose. Patient has no bleeding but some bruising--not new per patient. She states she is usually stable on home regimen. Eating fewer greens while inpatient vs fluid status changes may be contributing.   INR therapeutic at 2.6. On scheduled lower dose. Two doses documented given on 7/13- clarified with RN that it was an error and she will unchart one dose. No reported bleeding.   Home warfarin: 2.5 mg po daily, except for 3.75 mg po on Monday, Friday  Goal of Therapy:  INR 2-3 Monitor platelets by anticoagulation protocol: Yes   Plan:  Schedule warfarin 30m daily  Monitor daily INR, CBC, decrease frequency tomorrow if stable  Monitor for signs/symptoms of bleeding Suggest discontinuing home cimetidine and continue famotidine at discharge due to drug interaction with warfarin  LBenetta Spar PharmD, BCPS, BNorth GatePharmacist  Please check AMION for all [place] numbers After 10:00 PM, call [place]

## 2019-08-25 NOTE — Plan of Care (Signed)
  Problem: Consults Goal: RH GENERAL PATIENT EDUCATION Description: See Patient Education module for education specifics. Outcome: Progressing   Problem: RH BLADDER ELIMINATION Goal: RH STG MANAGE BLADDER WITH ASSISTANCE Description: STG Manage Bladder With Assistance Min Outcome: Progressing   Problem: RH SKIN INTEGRITY Goal: RH STG SKIN FREE OF INFECTION/BREAKDOWN Description: Skin remains free of breakdown and infection with mod assist Outcome: Progressing   Problem: RH PAIN MANAGEMENT Goal: RH STG PAIN MANAGED AT OR BELOW PT'S PAIN GOAL Description: Less than 4 Outcome: Progressing   Problem: RH KNOWLEDGE DEFICIT GENERAL Goal: RH STG INCREASE KNOWLEDGE OF SELF CARE AFTER HOSPITALIZATION Description: Patient will be able to verbalize medication, diet management of CHF with cues/handouts Outcome: Progressing

## 2019-08-25 NOTE — Progress Notes (Signed)
Patient refused CPAP for tonight 

## 2019-08-26 ENCOUNTER — Inpatient Hospital Stay (HOSPITAL_COMMUNITY): Payer: Medicare Other | Admitting: Occupational Therapy

## 2019-08-26 ENCOUNTER — Inpatient Hospital Stay (HOSPITAL_COMMUNITY): Payer: Medicare Other

## 2019-08-26 ENCOUNTER — Inpatient Hospital Stay (HOSPITAL_COMMUNITY): Payer: Medicare Other | Admitting: *Deleted

## 2019-08-26 LAB — BASIC METABOLIC PANEL WITH GFR
Anion gap: 16 — ABNORMAL HIGH (ref 5–15)
BUN: 33 mg/dL — ABNORMAL HIGH (ref 8–23)
CO2: 20 mmol/L — ABNORMAL LOW (ref 22–32)
Calcium: 8.4 mg/dL — ABNORMAL LOW (ref 8.9–10.3)
Chloride: 104 mmol/L (ref 98–111)
Creatinine, Ser: 1.35 mg/dL — ABNORMAL HIGH (ref 0.44–1.00)
GFR calc Af Amer: 42 mL/min — ABNORMAL LOW
GFR calc non Af Amer: 36 mL/min — ABNORMAL LOW
Glucose, Bld: 384 mg/dL — ABNORMAL HIGH (ref 70–99)
Potassium: 4.5 mmol/L (ref 3.5–5.1)
Sodium: 140 mmol/L (ref 135–145)

## 2019-08-26 LAB — BLOOD GAS, ARTERIAL
Acid-base deficit: 6.4 mmol/L — ABNORMAL HIGH (ref 0.0–2.0)
Bicarbonate: 17.8 mmol/L — ABNORMAL LOW (ref 20.0–28.0)
Drawn by: 23604
FIO2: 36
O2 Saturation: 99.4 %
Patient temperature: 36.9
pCO2 arterial: 31 mmHg — ABNORMAL LOW (ref 32.0–48.0)
pH, Arterial: 7.378 (ref 7.350–7.450)
pO2, Arterial: 189 mmHg — ABNORMAL HIGH (ref 83.0–108.0)

## 2019-08-26 LAB — CBC
HCT: 23.2 % — ABNORMAL LOW (ref 36.0–46.0)
Hemoglobin: 6.5 g/dL — CL (ref 12.0–15.0)
MCH: 26.7 pg (ref 26.0–34.0)
MCHC: 28 g/dL — ABNORMAL LOW (ref 30.0–36.0)
MCV: 95.5 fL (ref 80.0–100.0)
Platelets: 164 10*3/uL (ref 150–400)
RBC: 2.43 MIL/uL — ABNORMAL LOW (ref 3.87–5.11)
RDW: 17.7 % — ABNORMAL HIGH (ref 11.5–15.5)
WBC: 11.4 10*3/uL — ABNORMAL HIGH (ref 4.0–10.5)
nRBC: 1.3 % — ABNORMAL HIGH (ref 0.0–0.2)

## 2019-08-26 LAB — PROTIME-INR
INR: 2.4 — ABNORMAL HIGH (ref 0.8–1.2)
Prothrombin Time: 25.5 s — ABNORMAL HIGH (ref 11.4–15.2)

## 2019-08-26 LAB — GLUCOSE, CAPILLARY: Glucose-Capillary: 296 mg/dL — ABNORMAL HIGH (ref 70–99)

## 2019-08-26 LAB — PREPARE RBC (CROSSMATCH)

## 2019-08-26 MED ORDER — LIDOCAINE 5 % EX PTCH
1.0000 | MEDICATED_PATCH | CUTANEOUS | Status: DC
  Administered 2019-08-26: 1 via TRANSDERMAL
  Filled 2019-08-26: qty 1

## 2019-08-26 MED ORDER — WARFARIN SODIUM 2.5 MG PO TABS
2.5000 mg | ORAL_TABLET | Freq: Every day | ORAL | Status: DC
  Administered 2019-08-26: 2.5 mg via ORAL
  Filled 2019-08-26: qty 1

## 2019-08-26 MED ORDER — HYDROCODONE-ACETAMINOPHEN 5-325 MG PO TABS
1.0000 | ORAL_TABLET | Freq: Once | ORAL | Status: AC
  Administered 2019-08-26: 1 via ORAL

## 2019-08-26 MED ORDER — ALBUTEROL SULFATE (2.5 MG/3ML) 0.083% IN NEBU: 2.5000 mg | INHALATION_SOLUTION | Freq: Once | RESPIRATORY_TRACT | Status: DC | PRN

## 2019-08-26 MED ORDER — SODIUM CHLORIDE 0.9% IV SOLUTION: Freq: Once | INTRAVENOUS | Status: DC

## 2019-08-26 NOTE — Progress Notes (Signed)
Notified by therapy that Patient had a fall in the gym. Patient was assessed; Patient has a skin tear on R knee, and L ankle (posterior). Skin tear have been cleansed and dress with foam dressing. Pam,Pa-C has been notified. Will continue to monitor. Amanda Cockayne, LPN

## 2019-08-26 NOTE — Progress Notes (Addendum)
Physical Therapy Weekly Progress Note  Patient Details  Name: [name] MRN: [id number] Date of Birth: [birth year]  Beginning of progress report period: August 19, 2019 End of progress report period: August 26, 2019  Today's Date: 08/26/2019 PT Individual Time: 1005-1102 and 0370-4888 PT Individual Time Calculation (min): 57 min and 37 min  Patient has met 3 of 4 short term goals.  Pt is progressing well toward PT goals, improving independence with bed mobility, functional transfers, ambulation, and initiating stair training. Pt's balance and confidence in standing is much improved and pt is ambulating up to 150' with SPC and CGA. Pt does become short of breath with activity but oxygen levels have remained >95% on 2L O2, which is her home level. Pt is anxious about falling and has experienced one assisted fall during stay, which is an area in need of improvement. Focus of next week will be on continued gait and stair training with emphasis on BLE strength and fatigue awareness.  Patient continues to demonstrate the following deficits muscle weakness, decreased cardiorespiratoy endurance and decreased sitting balance, decreased standing balance and decreased balance strategies and therefore will continue to benefit from skilled PT intervention to increase functional independence with mobility.  Patient progressing toward long term goals..  Continue plan of care.  PT Short Term Goals Week 1:  PT Short Term Goal 1 (Week 1): Pt will ambulate 58f w safe use of /LRAD and min assist PT Short Term Goal 1 - Progress (Week 1): Met PT Short Term Goal 2 (Week 1): Pt will ascend/descend one 5in step w/single rail w/min assist PT Short Term Goal 2 - Progress (Week 1): Met PT Short Term Goal 3 (Week 1): bed to/from wc w/min assist and safe use of LRAD PT Short Term Goal 3 - Progress (Week 1): Met PT Short Term Goal 4 (Week 1): Pt will propel hemiheight wc up to 538fw/bilat LEs and min assist PT Short Term  Goal 4 - Progress (Week 1): Progressing toward goal Week 2:  PT Short Term Goal 1 (Week 2): STGs = LTGs due to ELOS  Skilled Therapeutic Interventions/Progress Updates:  Ambulation/gait training;DME/adaptive equipment instruction;Stair training;UE/LE Strength taining/ROM;Wheelchair propulsion/positioning;Balance/vestibular training;Discharge planning;Pain management;Skin care/wound management;Therapeutic Activities;UE/LE Coordination activities;Functional mobility training;Patient/family education;Therapeutic Exercise   1st Session: Pt received seated in WC and agreeable to therapy. Reports back pain at 6/10. PT provides rest breaks as needed to manage pain. Pt reports needing to have bowel movement. Pt performs stand step transfer to toilet with SPC and CGA, with PT providing modA for removal of lower body dressing. Pt stands from toilet with CGA and PT provides totalA for pericare. [facility]. WC transpor to therapy gym for time management.  Pt performs x1 6" step with SPC and minA/modA from PT, but verbalizes that step at home is being altered to be less steep. Pt then performs multiple bouts of ambulating with 3" step platform. Pt requires CGA/minA for stepping up onto platform and appears to have more confidence with step when ambulating at faster speed and using momentum. Pt ambulates max of 75' during training, performing multiple turns and with PT cues for upright gaze to improve posture and balance, and correct sequencing of stair stepping. Pt left seated in WC with alarm intact and all needs within reach.  2nd Session: pt received supine in bed and agreeable to therapy. Reports pain in R shoulder. PT provides rest breaks as needed to manage pain. MinA for supine to  sit and CGA for stand step transfer to [facility] without AD. WC transport to therapy gym for energy conservation. Pt performs 8 3" steps with LHR and PT providing CGA. At top of steps on platform, pt experience bilateral  knee buckling spontaneously and has assisted fall to platform. PT supports pt at trunk as 2nd PT comes to assist pt into more comfortable seated position. Extended time taken to calm pt down as she was very anxious and scared from fall. Algis Liming, PA, present to perform brief physical exam of pt and verbalizes that she will order x-ray of R shoulder due to pt complaining of increased pain and fear that she had harmed integrity of surgery. modA +2 providing to stand pt from steps and step down x2 6 inch steps to WC. Pt then stands with minA from PT from [facility] and verbalizes that BLEs feel shaky but not acutely injured. PT provides ice pack for pt's shoulder. Pt left seated in WC, with alarm intact and all needs within reach.  Therapy Documentation Precautions:  Precautions Precautions: Fall, Shoulder Type of Shoulder Precautions: per chart/ortho note pendulums; NWB RUE Shoulder Interventions: Shoulder sling/immobilizer, Off for dressing/bathing/exercises Precaution Comments: NWB RUE per MD note Required Braces or Orthoses: Sling Restrictions Weight Bearing Restrictions: Yes RUE Weight Bearing: Non weight bearing Other Position/Activity Restrictions: RUE per chart   Therapy/Group: Individual Therapy  Breck Coons, PT, DPT 08/26/2019, 4:29 PM

## 2019-08-26 NOTE — Significant Event (Addendum)
Rapid Response Event Note  Overview: Called d/t SOB.    Initial Focused Assessment: Pt laying in bed with a blank stare on her face. Pt is alert and oriented, however, repeated stimulation is needed for participation. She says her chest feels tight and she isn't getting any air.  Breathing is mildly labored. Lungs diminished t/o R>L. Skin pale and cool to touch. Pupils: L-2 and slugglish, R-4 and slugglish(Pt has had surgery on L eye).  T-98.3, HR-77, BP-101/53, RR-22, SpO2-97% on 3L [place], CBG-296. Pt increased to 6L [place], SpO2-100%. Pt was still c/o not being able to get any air so pt placed on NRB. This was later weaned down to 5L [place], SpO2-100%.  Pt also began to get increasingly confused during exam and began to c/o being nauseous.   Interventions: NRB, Green EKG-Afib PCXR-1. Cardiomegaly with chronic pulmonary interstitial changes. 2. Patchy opacity at the left lung base, although chronic atelectasis here on prior exams. ABG-7.377/31.1/190/17.8 CBC-hbg-6.5, WBC-11.4 BMP-creat-1.35 New PIV started by IVT  Orders after lab results in: hemoocult next stool  T & C, transfuse 1 units PRBCs over 4 hours. CBC/BMP in AM   Plan of Care (if not transferred): Give PRBCs slowly, over 4 hours. Repeat CBC/BMP in am. Wean nasal cannula for SpO2>90%. Continue to monitor mental status and breathing closely. Call RRT if further assistance needed.  Event Summary:  [place], PA notified by bedside RN and myself during interventions.   Called: [phone] Arrived: 15 Ended: 2140  D[place]

## 2019-08-26 NOTE — Evaluation (Signed)
Recreational Therapy Assessment and Plan  Patient Details  Name: [name] MRN: [id number] Date of Birth: 19-Dec-1934 Today's Date: 08/26/2019  Rehab Potential:  Good ELOS:   d/c 7/23  Assessment Hospital Problem: Principal Problem:   Physical debility   Past Medical History:      Past Medical History:  Diagnosis Date  . Atrial fibrillation ([place])   . CAP (community acquired pneumonia) 12/27/2018  . Carotid artery occlusion   . Chronic diastolic CHF (congestive heart failure) (HCC)    Hypertensive heart disease 02-12-14  . Crohn's disease ([place])   . Detached retina   . Fibromyalgia   . GERD (gastroesophageal reflux disease)   . Gout   . H/O hiatal hernia   . High triglycerides   . History of stomach ulcers 1950's  . Hypertension   . Long term current use of anticoagulant   . Obstructive sleep apnea on CPAP   . Osteoarthritis    PAIN AND OA LEF T HIP AND BOTH SHOULDERS ARE BONE ON BONE AND PAINFUL  . Peripheral vascular disease (HCC)    KNOWN RIGHT INTERNAL CAROTID ARTERY OCCLUSION (NO STROKE)  --40 TO 59% STENOSIS LEFT ICA-FOLLOWED BY DR. EARLY WITH DOPPLER STUDY EVERY 6 MONTHS  . Pulmonary embolism ([place]) 2011   a. Hx of PE in 02/2009 after R hip surgery, venous dopplers negative, long-term Coumadin.  [name] Kitchen PVC (premature ventricular contraction)    PT STATES HX OF PVC'S ON EKG  . Recurrent upper respiratory infection (URI)    BRONCHITIS FEB 2013--SLIGHT COUGH NON-PRODUCTIVE NOW  . Recurrent UTI (urinary tract infection)    "on daily medicine" (05/14/2012)  . Tinnitus   . Vertigo    Past Surgical History:       Past Surgical History:  Procedure Laterality Date  . APPENDECTOMY  1950's  . BACK SURGERY  10/29/06   Central and foraminal decompression L3-L4, L4-L5, and L5-S1 with inspection of L4-L5 and L5-S1 disc on the right  . CARDIAC CATHETERIZATION  1970's   "maybe 2" (05/14/2012)  . CARDIAC CATHETERIZATION    . CATARACT EXTRACTION  W/ INTRAOCULAR LENS IMPLANT Right 2009  . CHOLECYSTECTOMY  ~ 1988  . DECOMPRESSIVE LUMBAR LAMINECTOMY LEVEL 3  ~ 2002  . DILATION AND CURETTAGE OF UTERUS     "several; from miscarriages" (05/14/2012)  . EXCISION MORTON'S NEUROMA Right 05/20/00   "foot" (05/14/2012)  . EYE SURGERY Left 2014   Detached retina  . EYE SURGERY Left August 23, 2013   Cataract  . FEMUR FRACTURE SURGERY Right 02/21/09   Open reduction internal fixation of right periprosthetic  femur fracture utilizing Zimmer cables times fiv  . FRACTURE SURGERY  2011   Right Femur Fx  . HAMMER TOE SURGERY Left 10/25/08   "toe next to big to" (05/14/2012)  . JOINT REPLACEMENT  2009   Right Hip replacement  . JOINT REPLACEMENT  2012   Right knee replacement  . JOINT REPLACEMENT  06/17/11   Left Hip replacement  . KNEE ARTHROSCOPY Right 07/26/05  . REPLACEMENT TOTAL KNEE Right 02/19/2010  . REVISION TOTAL SHOULDER TO REVERSE TOTAL SHOULDER Right 07/20/2019   Procedure: REVERSE TOTAL SHOULDER REPLACEMENT;  Surgeon: Nicholes Stairs, MD;  Location: [facility];  Service: Orthopedics;  Laterality: Right;  . SPINE SURGERY    . [place]?  . TOTAL HIP ARTHROPLASTY Right 12/08/02   Osteonics total hip replacement  . TOTAL HIP ARTHROPLASTY  06/17/2011   Procedure: TOTAL HIP ARTHROPLASTY;  Surgeon:  Gearlean Alf, MD;  Location: [facility];  Service: Orthopedics;  Laterality: Left;  . TOTAL SHOULDER ARTHROPLASTY Left 05/14/2012  . TOTAL SHOULDER ARTHROPLASTY Left 05/14/2012   Procedure: TOTAL SHOULDER ARTHROPLASTY;  Surgeon: Marin Shutter, MD;  Location: [facility];  Service: Orthopedics;  Laterality: Left;  [name] Kitchen VAGINAL HYSTERECTOMY  1971    Assessment & Plan Clinical Impression:[name] is an 85 year old female with history of HTN, OSA-on CPAP, CAF- on coumadin, chronic diastolic CHF, morbid obesity, DJD/DDD with chronic pain, recent admission for left femur Fx s/p ORIF and again for right humerus fx  with reverse total shoulder on 07/20/2019 and was sent to [facility] for rehab. . She reports that she was have increase in SOB with weigh gain and was discharged to home on 08/14/2019 but readmitted on 08/15/2019 with CHF exacerbation. She had progressive dyspnea. She was treated with IV diuresis as well as non-rebreather mask. She was found to have hypomagnesemia which was supplemented, anemia with Hgb 8.7 and reported problem swallowing. MBS done revealing normal swallow and symptoms felt to be due to GERD. She has been treated with IV diuresis with resolution of dyspnea and continues to require supplemental oxygen. Weight down to 187 lbs but follow up CXR today with increasing infiltrates. Therapy ongoing and patient continues to be limited by balance deficits and requires frequent rest breaks. Reported to have to hypoxia with SOB and fatigue with minimal activity. CIR recommended due to functional decline.  Patient transferred to CIR on 08/18/2019 .   Pt presents with decreased activity tolerance, decreased oxygen support, decreased functional mobility, decreased balance, difficulty maintaining precautions Limiting pt's independence with leisure/community pursuits.   Plan Min 1 TR session during LOS >20 minutes  Recommendations for other services: None   Discharge Criteria: Patient will be discharged from TR if patient refuses treatment 3 consecutive times without medical reason.  If treatment goals not met, if there is a change in medical status, if patient makes no progress towards goals or if patient is discharged from hospital.  The above assessment, treatment plan, treatment alternatives and goals were discussed and mutually agreed upon: by patient  [place] 08/26/2019, 3:52 PM

## 2019-08-26 NOTE — Plan of Care (Signed)
  Problem: Consults Goal: RH GENERAL PATIENT EDUCATION Description: See Patient Education module for education specifics. Outcome: Progressing   Problem: RH BLADDER ELIMINATION Goal: RH STG MANAGE BLADDER WITH ASSISTANCE Description: STG Manage Bladder With Assistance Min Outcome: Progressing   Problem: RH SKIN INTEGRITY Goal: RH STG SKIN FREE OF INFECTION/BREAKDOWN Description: Skin remains free of breakdown and infection with mod assist Outcome: Progressing   Problem: RH PAIN MANAGEMENT Goal: RH STG PAIN MANAGED AT OR BELOW PT'S PAIN GOAL Description: Less than 4 Outcome: Progressing   Problem: RH KNOWLEDGE DEFICIT GENERAL Goal: RH STG INCREASE KNOWLEDGE OF SELF CARE AFTER HOSPITALIZATION Description: Patient will be able to verbalize medication, diet management of CHF with cues/handouts Outcome: Progressing

## 2019-08-26 NOTE — Progress Notes (Addendum)
[name] for Warfarin Indication: atrial fibrillation  Patient Measurements: Height: 5' 2"  (157.5 cm) Weight: 87 kg (191 lb 12.8 oz) IBW/kg (Calculated) : 50.1  Vital Signs: Temp: 97.8 F (36.6 C) (07/15 1325) BP: 113/48 (07/15 1325) Pulse Rate: 76 (07/15 1325)  Labs: Recent Labs    08/24/19 0615 08/25/19 0734 08/26/19 0703  LABPROT 29.0* 26.9* 25.5*  INR 2.9* 2.6* 2.4*  CREATININE 0.93  --   --     Estimated Creatinine Clearance: 47 mL/min (by C-G formula based on SCr of 0.93 mg/dL).   Assessment: 84 yr old on warfarin for Afib, admitted for HF exacerbation. Eating 80-100% of meals (improving from last couple of days). H/H, plt stable. INR high on home dose. Patient has no bleeding but some bruising--not new per patient. She states she is usually stable on home regimen. Eating fewer greens while inpatient vs fluid status changes may be contributing.   INR therapeutic at 2.4 but still downtrending. On scheduled lower dose thank home dose due to previous high INRs on home dose    Home warfarin: 2.5 mg po daily, except for 3.75 mg po on Monday, Friday  Goal of Therapy:  INR 2-3 Monitor platelets by anticoagulation protocol: Yes   Plan:  Increase scheduled warfarin to 2.40m daily  Monitor INRon Sat 7/17, then MWF if stable CBC Q Mon  Monitor for signs/symptoms of bleeding Suggest discontinuing home cimetidine and continue famotidine at discharge due to drug interaction with warfarin  LBenetta Spar PharmD, BCPS, BFort MitchellPharmacist  Please check AMION for all MAgencyphone numbers After 10:00 PM, call [place]

## 2019-08-26 NOTE — Progress Notes (Signed)
Pt refuses to wear CPAP.

## 2019-08-26 NOTE — Progress Notes (Signed)
[facility] PHYSICAL MEDICINE & REHABILITATION PROGRESS NOTE   Subjective/Complaints: Complains of left lower back pain and right shoulder pain. She does use lidocaine patches at home and would like to try these.  Asks about how the muscle relaxer for her was ordered and advised that she has Skelaxin PRN  ROS:   Pt denies SOB, abd pain, CP, N/V/C/D, and vision changes   Objective:   No results found. No results for input(s): WBC, HGB, HCT, PLT in the last 72 hours. Recent Labs    08/24/19 0615  NA 140  K 4.0  CL 98  CO2 32  GLUCOSE 117*  BUN 23  CREATININE 0.93  CALCIUM 9.0    Intake/Output Summary (Last 24 hours) at 08/26/2019 1232 Last data filed at 08/26/2019 0733 Gross per 24 hour  Intake 1164 ml  Output --  Net 1164 ml     Physical Exam: Vital Signs Blood pressure (!) 143/67, pulse 92, temperature 97.6 F (36.4 C), temperature source Oral, resp. rate 18, height 5' 2"  (1.575 m), weight 87 kg, SpO2 97 %. General: Alert and oriented x 3, No apparent distress HEENT: Head is normocephalic, atraumatic, PERRLA, EOMI, sclera anicteric, oral mucosa pink and moist, dentition intact, ext ear canals clear,  Neck: Supple without JVD or lymphadenopathy Heart: Reg rate and rhythm. No murmurs rubs or gallops Chest: CTA bilaterally without wheezes, rales, or rhonchi; no distress Abdomen: Soft, non-tender, non-distended, bowel sounds positive. Extremities: No clubbing, cyanosis, or edema. Pulses are 2+ Musc: No edema in extremities.  No tenderness in extremities. TTP over mid to low back and entire R shoulder/upper traps Neuro: Alert Motor: RUE: Limited by sling proximally, handgrip 4+/5 LUE: 5/5 proximal distal RLE: 4+/5 proximal distally, stable LLE: 5/5 proximal to distal Skin: Lots of bruising scattered across back- no change. Left forearm ecchymosis Left wrist with dressing C/D/I  Assessment/Plan: 1. Functional deficits secondary to physical debility which require 3+  hours per day of interdisciplinary therapy in a comprehensive inpatient rehab setting.  Physiatrist is providing close team supervision and 24 hour management of active medical problems listed below.  Physiatrist and rehab team continue to assess barriers to discharge/monitor patient progress toward functional and medical goals  Care Tool:  Bathing    Body parts bathed by patient: Right arm, Chest, Abdomen, Right upper leg, Left upper leg, Face, Front perineal area, Right lower leg, Left lower leg   Body parts bathed by helper: Buttocks, Left arm     Bathing assist Assist Level: Minimal Assistance - Patient > 75%     Upper Body Dressing/Undressing Upper body dressing   What is the patient wearing?: Pull over shirt    Upper body assist Assist Level: Minimal Assistance - Patient > 75%    Lower Body Dressing/Undressing Lower body dressing      What is the patient wearing?: Incontinence brief, Pants     Lower body assist Assist for lower body dressing: Moderate Assistance - Patient 50 - 74%     Toileting Toileting    Toileting assist Assist for toileting: Moderate Assistance - Patient 50 - 74%     Transfers Chair/bed transfer  Transfers assist     Chair/bed transfer assist level: Contact Guard/Touching assist     Locomotion Ambulation   Ambulation assist      Assist level: Contact Guard/Touching assist Assistive device: Cane-straight Max distance: 150'   Walk 10 feet activity   Assist     Assist level: Contact Guard/Touching assist Assistive device:  Cane-straight   Walk 50 feet activity   Assist Walk 50 feet with 2 turns activity did not occur: Safety/medical concerns  Assist level: Contact Guard/Touching assist Assistive device: Cane-straight    Walk 150 feet activity   Assist Walk 150 feet activity did not occur: Safety/medical concerns  Assist level: Contact Guard/Touching assist Assistive device: Cane-straight    Walk 10 feet on  uneven surface  activity   Assist Walk 10 feet on uneven surfaces activity did not occur: Safety/medical concerns         Wheelchair     Assist Will patient use wheelchair at discharge?: No             Wheelchair 50 feet with 2 turns activity    Assist            Wheelchair 150 feet activity     Assist          Blood pressure (!) 143/67, pulse 92, temperature 97.6 F (36.4 C), temperature source Oral, resp. rate 18, height 5' 2"  (1.575 m), weight 87 kg, SpO2 97 %.  Medical Problem List and Plan: 1.  Balance deficits, DOE secondary to debility from CHF exacerbation, with recent history of right shoulder and left femur fracture.  Continue CIR 2.  Antithrombotics: -DVT/anticoagulation:  Pharmaceutical: Coumadin (goal 2-3). 7/15: INR is 2.4             -antiplatelet therapy: N/A 3. Chronic back pain/Fibromyalgia/Pain Management: Dr. Royanne Foots hydrocodone prn at home             7/15: added lidocaine patch to left lower back and right shoulder 4. Mood: LCSW to follow for evaluation and support.              -antipsychotic agents: N/A 5. Neuropsych: This patient is capable of making decisions on her own behalf. 6. Skin/Wound Care: Routine pressure relief measures.  Added nystatin for candida skin folds.  7. Fluids/Electrolytes/Nutrition: Monitor I/O. Strict I/Os.   8. Acute on chronic CHF: On Bystolic and Lasix --intolerant of statins.  Daily weights with strict I/Od and HH diet. Now on lasix once daily.  Filed Weights   08/24/19 0500 08/25/19 0511 08/26/19 0500  Weight: 86.3 kg 87 kg 87 kg   7/15: weight is stable.  9.  OSA: Has been refusing CPAP--important to help with respiratory/cardiac status. Husband to bring in her mask.   Supplemental oxygen dependent PTA, continue 10. GERD: Symptoms worse since surgery but have been better since hospitalization.  Continue Pepcid (used Tagamet PTA). Resumed probiotic. 11. HTN: Monitor BP--continue Norvasc  and Lasix.              Monitor with increased mobility.  7/15: BP is labile- continue to monitor.  12. Chronic hypomagnesemia: Does not like to use supplements due to SE of diarrhea.   Magnesium improved with IV mag to 1.9 on 7/9 13. ABLA: Added iron supplement.            7/12- Hb 9.7- likely due to increase in Cr 14. Right humerus fx: NWB RUE with sling. Pendulum exercises and AAROM with PT  15. Morbid obesity: Encouraged weight loss. 16.  Sleep disturbance: Continue melatonin 54m HS  Improving 17.  AKI  Creatinine 1.01 on 7/11   Encourage fluids  7/12- Cr up to 1.13- was on 7/10- when forgets to push fluids, rises.   7/13- Cr 0.93- improved this AM  Continue to monitor 18. Muscle spasms  7/12- will add Robaxin 500  mg q6 hours prn  7/13- doing better this AM with robaxin   LOS: 8 days A FACE TO FACE EVALUATION WAS PERFORMED  Clide Deutscher [place] 08/26/2019, 12:32 PM

## 2019-08-26 NOTE — Progress Notes (Signed)
Occupational Therapy Weekly Progress Note  Patient Details  Name: [name] MRN: [id number] Date of Birth: [birth year]  Beginning of progress report period: August 19, 2019 End of progress report period: August 26, 2019  Today's Date: 08/26/2019 OT Individual Time: 7342-8768 and 1300-1353 OT Individual Time Calculation (min): 54 min and 53 min   Patient has met 3 of 4 short term goals.  Pt has shown significant improvement in ability to perform UB and LB ADLs with use of AE such as reacher and long handled sponge to be able to reach her feet to thread clothing and wash in the shower. Pt has improved functional mobility skills with use of SPC in room to access bathroom for toileting and bathing with improved standing balance and steadiness on her feet. Pt continues to need assistance with clothing management and dressing to get pants back over her hips and will continue to address. Pt is Min for UB/LB bathing, Min for UB dressing and Mod for LB dressing and toileting.  Patient continues to demonstrate the following deficits: muscle weakness, decreased cardiorespiratoy endurance and decreased standing balance and decreased balance strategies and therefore will continue to benefit from skilled OT intervention to enhance overall performance with BADL and Reduce care partner burden.  Patient progressing toward long term goals..  Continue plan of care.  OT Short Term Goals Week 1:  OT Short Term Goal 1 (Week 1): Pt will completed toileting 3/3 steps with min assist. OT Short Term Goal 1 - Progress (Week 1): Progressing toward goal OT Short Term Goal 2 (Week 1): Pt will complete UB bathing with min assist. OT Short Term Goal 2 - Progress (Week 1): Met OT Short Term Goal 3 (Week 1): Pt will complete LB dressing with AE as needed with mod assist. OT Short Term Goal 3 - Progress (Week 1): Met OT Short Term Goal 4 (Week 1): Pt will complete UB dressing with min assist. OT Short Term Goal 4 - Progress  (Week 1): Met Week 2:  OT Short Term Goal 1 (Week 2): STGs=LTGs due to ELOS  Skilled Therapeutic Interventions/Progress Updates:    Session 1: Pt greeted at time of session supine in bed with HOB elevated and noted to have RUE in poor position, also resting off of her O2 with complaints of dizziness. BP 98/51 which improved with positional changes and putting on O2, increased to 120/84. Pt ambulated bed to bathroom and transferred to shower bench CGA all aspects with SPC. Doffed clothing min A and performed UB/LB bathing Min overall with long handled sponge. Pt requires assist to manage breasts via therapist to wash underneath with LUE d/t immobilizing and NWB for RUE and to decrease risk of further rash. Pt dried off in the same manner and performed UB dressing Min with hemidressing technique and LB dressing Mod (unable to locate reacher this am but one has been provided). Pt set up at sink level and performed grooming/oral hygiene with set up/supervision. Pt in wheelchair with alarm on, call bell in reach, all needs met.   Session 2: Pt greeted at time of session sitting up in wheelchair agreeable to OT session and some c/o pain in low back, no number given but pt repositioned throughout for comfort. Pt expressed pain on back of ears from O2 tubing, located and provided padding for comfort. Pt then ambulated to/from bathroom with SPC with CGA and transferred to toilet in the same manner. Clothing management to don/doff with Min A primarily on R  side d/t limitedrange with LUE, and required assist for hygiene/cleaning and does not want a cleansing aide (she has one at home and said it makes a mess). Ambulated to bed with CGA with SPC and sit to supine in same manner. Therapist performed PROM for RUE for shoulder flexion and ER within restrictions per protocol, pt had discomfort with shoulder flexion> ER. Pt wanted to remain in bed to rest before next session with alarm on, call bell in reach, all needs met.      Therapy Documentation Precautions:  Precautions Precautions: Fall, Shoulder Type of Shoulder Precautions: per chart/ortho note pendulums; NWB RUE Shoulder Interventions: Shoulder sling/immobilizer, Off for dressing/bathing/exercises Precaution Comments: NWB RUE per MD note Required Braces or Orthoses: Sling Restrictions Weight Bearing Restrictions: Yes RUE Weight Bearing: Non weight bearing Other Position/Activity Restrictions: RUE per chart    Therapy/Group: Individual Therapy  Viona Gilmore 08/26/2019, 9:17 AM

## 2019-08-27 ENCOUNTER — Inpatient Hospital Stay (HOSPITAL_COMMUNITY): Payer: Medicare Other

## 2019-08-27 ENCOUNTER — Inpatient Hospital Stay (HOSPITAL_COMMUNITY)
Admission: AD | Admit: 2019-08-27 | Discharge: 2019-09-12 | DRG: 377 | Disposition: E | Payer: Medicare Other | Source: Intra-hospital | Attending: Pulmonary Disease | Admitting: Pulmonary Disease

## 2019-08-27 DIAGNOSIS — W19XXXA Unspecified fall, initial encounter: Secondary | ICD-10-CM | POA: Diagnosis present

## 2019-08-27 DIAGNOSIS — Z885 Allergy status to narcotic agent status: Secondary | ICD-10-CM

## 2019-08-27 DIAGNOSIS — Z96611 Presence of right artificial shoulder joint: Secondary | ICD-10-CM | POA: Diagnosis present

## 2019-08-27 DIAGNOSIS — S36898A Other injury of other intra-abdominal organs, initial encounter: Secondary | ICD-10-CM | POA: Diagnosis present

## 2019-08-27 DIAGNOSIS — K922 Gastrointestinal hemorrhage, unspecified: Principal | ICD-10-CM | POA: Diagnosis present

## 2019-08-27 DIAGNOSIS — K219 Gastro-esophageal reflux disease without esophagitis: Secondary | ICD-10-CM | POA: Diagnosis present

## 2019-08-27 DIAGNOSIS — G8929 Other chronic pain: Secondary | ICD-10-CM | POA: Diagnosis present

## 2019-08-27 DIAGNOSIS — I5032 Chronic diastolic (congestive) heart failure: Secondary | ICD-10-CM | POA: Diagnosis present

## 2019-08-27 DIAGNOSIS — J9601 Acute respiratory failure with hypoxia: Secondary | ICD-10-CM | POA: Diagnosis present

## 2019-08-27 DIAGNOSIS — Z66 Do not resuscitate: Secondary | ICD-10-CM | POA: Diagnosis present

## 2019-08-27 DIAGNOSIS — I468 Cardiac arrest due to other underlying condition: Secondary | ICD-10-CM | POA: Diagnosis not present

## 2019-08-27 DIAGNOSIS — I480 Paroxysmal atrial fibrillation: Secondary | ICD-10-CM | POA: Diagnosis present

## 2019-08-27 DIAGNOSIS — J9602 Acute respiratory failure with hypercapnia: Secondary | ICD-10-CM | POA: Diagnosis present

## 2019-08-27 DIAGNOSIS — Z888 Allergy status to other drugs, medicaments and biological substances status: Secondary | ICD-10-CM

## 2019-08-27 DIAGNOSIS — Z86711 Personal history of pulmonary embolism: Secondary | ICD-10-CM

## 2019-08-27 DIAGNOSIS — M797 Fibromyalgia: Secondary | ICD-10-CM | POA: Diagnosis present

## 2019-08-27 DIAGNOSIS — Z9181 History of falling: Secondary | ICD-10-CM | POA: Diagnosis not present

## 2019-08-27 DIAGNOSIS — Z96651 Presence of right artificial knee joint: Secondary | ICD-10-CM | POA: Diagnosis present

## 2019-08-27 DIAGNOSIS — Z880 Allergy status to penicillin: Secondary | ICD-10-CM

## 2019-08-27 DIAGNOSIS — E785 Hyperlipidemia, unspecified: Secondary | ICD-10-CM | POA: Diagnosis present

## 2019-08-27 DIAGNOSIS — Z01818 Encounter for other preprocedural examination: Secondary | ICD-10-CM

## 2019-08-27 DIAGNOSIS — Z90710 Acquired absence of both cervix and uterus: Secondary | ICD-10-CM

## 2019-08-27 DIAGNOSIS — Z7901 Long term (current) use of anticoagulants: Secondary | ICD-10-CM | POA: Diagnosis not present

## 2019-08-27 DIAGNOSIS — Z9049 Acquired absence of other specified parts of digestive tract: Secondary | ICD-10-CM

## 2019-08-27 DIAGNOSIS — I482 Chronic atrial fibrillation, unspecified: Secondary | ICD-10-CM | POA: Diagnosis present

## 2019-08-27 DIAGNOSIS — R578 Other shock: Secondary | ICD-10-CM | POA: Diagnosis present

## 2019-08-27 DIAGNOSIS — I469 Cardiac arrest, cause unspecified: Secondary | ICD-10-CM | POA: Insufficient documentation

## 2019-08-27 DIAGNOSIS — G4733 Obstructive sleep apnea (adult) (pediatric): Secondary | ICD-10-CM | POA: Diagnosis present

## 2019-08-27 DIAGNOSIS — Z887 Allergy status to serum and vaccine status: Secondary | ICD-10-CM

## 2019-08-27 DIAGNOSIS — Z4659 Encounter for fitting and adjustment of other gastrointestinal appliance and device: Secondary | ICD-10-CM

## 2019-08-27 DIAGNOSIS — Z823 Family history of stroke: Secondary | ICD-10-CM

## 2019-08-27 DIAGNOSIS — M109 Gout, unspecified: Secondary | ICD-10-CM | POA: Diagnosis present

## 2019-08-27 DIAGNOSIS — M5136 Other intervertebral disc degeneration, lumbar region: Secondary | ICD-10-CM | POA: Diagnosis present

## 2019-08-27 DIAGNOSIS — N179 Acute kidney failure, unspecified: Secondary | ICD-10-CM | POA: Diagnosis present

## 2019-08-27 DIAGNOSIS — D62 Acute posthemorrhagic anemia: Secondary | ICD-10-CM | POA: Diagnosis present

## 2019-08-27 DIAGNOSIS — Z87440 Personal history of urinary (tract) infections: Secondary | ICD-10-CM

## 2019-08-27 DIAGNOSIS — E781 Pure hyperglyceridemia: Secondary | ICD-10-CM | POA: Diagnosis present

## 2019-08-27 DIAGNOSIS — I110 Hypertensive heart disease with heart failure: Secondary | ICD-10-CM | POA: Diagnosis present

## 2019-08-27 DIAGNOSIS — Z96612 Presence of left artificial shoulder joint: Secondary | ICD-10-CM | POA: Diagnosis present

## 2019-08-27 DIAGNOSIS — E872 Acidosis: Secondary | ICD-10-CM | POA: Diagnosis present

## 2019-08-27 DIAGNOSIS — Z8711 Personal history of peptic ulcer disease: Secondary | ICD-10-CM

## 2019-08-27 DIAGNOSIS — Z881 Allergy status to other antibiotic agents status: Secondary | ICD-10-CM

## 2019-08-27 DIAGNOSIS — Z87891 Personal history of nicotine dependence: Secondary | ICD-10-CM

## 2019-08-27 DIAGNOSIS — Z833 Family history of diabetes mellitus: Secondary | ICD-10-CM

## 2019-08-27 DIAGNOSIS — Z8249 Family history of ischemic heart disease and other diseases of the circulatory system: Secondary | ICD-10-CM

## 2019-08-27 DIAGNOSIS — Z96643 Presence of artificial hip joint, bilateral: Secondary | ICD-10-CM | POA: Diagnosis present

## 2019-08-27 LAB — POCT I-STAT EG7
Acid-base deficit: 24 mmol/L — ABNORMAL HIGH (ref 0.0–2.0)
Bicarbonate: 9.1 mmol/L — ABNORMAL LOW (ref 20.0–28.0)
Calcium, Ion: 1.06 mmol/L — ABNORMAL LOW (ref 1.15–1.40)
HCT: 17 % — ABNORMAL LOW (ref 36.0–46.0)
Hemoglobin: 5.8 g/dL — CL (ref 12.0–15.0)
O2 Saturation: 17 %
Potassium: 6.5 mmol/L (ref 3.5–5.1)
Sodium: 143 mmol/L (ref 135–145)
TCO2: 11 mmol/L — ABNORMAL LOW (ref 22–32)
pCO2, Ven: 62.9 mmHg — ABNORMAL HIGH (ref 44.0–60.0)
pH, Ven: 6.768 — CL (ref 7.250–7.430)
pO2, Ven: 27 mmHg — CL (ref 32.0–45.0)

## 2019-08-27 LAB — CBC
HCT: 22.4 % — ABNORMAL LOW (ref 36.0–46.0)
Hemoglobin: 5.9 g/dL — CL (ref 12.0–15.0)
MCH: 27.7 pg (ref 26.0–34.0)
MCHC: 26.3 g/dL — ABNORMAL LOW (ref 30.0–36.0)
MCV: 105.2 fL — ABNORMAL HIGH (ref 80.0–100.0)
Platelets: UNDETERMINED 10*3/uL (ref 150–400)
RBC: 2.13 MIL/uL — ABNORMAL LOW (ref 3.87–5.11)
RDW: 18.8 % — ABNORMAL HIGH (ref 11.5–15.5)
WBC: 28.4 10*3/uL — ABNORMAL HIGH (ref 4.0–10.5)
nRBC: 7.1 % — ABNORMAL HIGH (ref 0.0–0.2)

## 2019-08-27 LAB — COMPREHENSIVE METABOLIC PANEL
ALT: 656 U/L — ABNORMAL HIGH (ref 0–44)
AST: 969 U/L — ABNORMAL HIGH (ref 15–41)
Albumin: 1.2 g/dL — ABNORMAL LOW (ref 3.5–5.0)
Alkaline Phosphatase: 124 U/L (ref 38–126)
Anion gap: 28 — ABNORMAL HIGH (ref 5–15)
BUN: 31 mg/dL — ABNORMAL HIGH (ref 8–23)
CO2: 9 mmol/L — ABNORMAL LOW (ref 22–32)
Calcium: 8.6 mg/dL — ABNORMAL LOW (ref 8.9–10.3)
Chloride: 109 mmol/L (ref 98–111)
Creatinine, Ser: 1.93 mg/dL — ABNORMAL HIGH (ref 0.44–1.00)
GFR calc Af Amer: 27 mL/min — ABNORMAL LOW (ref >60)
GFR calc non Af Amer: 24 mL/min — ABNORMAL LOW (ref >60)
Glucose, Bld: 320 mg/dL — ABNORMAL HIGH (ref 70–99)
Potassium: 6.7 mmol/L (ref 3.5–5.1)
Sodium: 146 mmol/L — ABNORMAL HIGH (ref 135–145)
Total Bilirubin: 0.8 mg/dL (ref 0.3–1.2)
Total Protein: 3 g/dL — ABNORMAL LOW (ref 6.5–8.1)

## 2019-08-27 LAB — PROTIME-INR
INR: >10 (ref 0.8–1.2)
Prothrombin Time: >90 seconds — ABNORMAL HIGH (ref 11.4–15.2)

## 2019-08-27 LAB — PREPARE RBC (CROSSMATCH)

## 2019-08-27 LAB — LACTIC ACID, PLASMA: Lactic Acid, Venous: >11 mmol/L (ref 0.5–1.9)

## 2019-08-27 LAB — TROPONIN I (HIGH SENSITIVITY): Troponin I (High Sensitivity): 2058 ng/L (ref <18)

## 2019-08-27 LAB — APTT: aPTT: >200 seconds (ref 24–36)

## 2019-08-27 MED ORDER — STERILE WATER FOR INJECTION IV SOLN
INTRAVENOUS | Status: DC
  Filled 2019-08-27: qty 850

## 2019-08-27 MED ORDER — PANTOPRAZOLE SODIUM 40 MG IV SOLR: 40.0000 mg | Freq: Every day | INTRAVENOUS | Status: DC

## 2019-08-27 MED ORDER — SODIUM CHLORIDE 0.9% IV SOLUTION: Freq: Once | INTRAVENOUS | Status: AC

## 2019-08-27 MED ORDER — DOCUSATE SODIUM 100 MG PO CAPS: 100.0000 mg | ORAL_CAPSULE | Freq: Two times a day (BID) | ORAL | Status: DC | PRN

## 2019-08-27 MED ORDER — POLYETHYLENE GLYCOL 3350 17 G PO PACK: 17.0000 g | PACK | Freq: Every day | ORAL | Status: DC | PRN

## 2019-08-27 MED ORDER — NOREPINEPHRINE 4 MG/250ML-% IV SOLN
INTRAVENOUS | Status: AC
  Filled 2019-08-27: qty 250

## 2019-08-27 MED ORDER — NOREPINEPHRINE 4 MG/250ML-% IV SOLN
INTRAVENOUS | Status: AC
  Administered 2019-08-27: 4 mg
  Filled 2019-08-27: qty 250

## 2019-08-27 MED ORDER — EPINEPHRINE HCL 5 MG/250ML IV SOLN IN NS
INTRAVENOUS | Status: AC
  Filled 2019-08-27: qty 250

## 2019-08-27 MED ORDER — SODIUM CHLORIDE 0.9% IV SOLUTION: Freq: Once | INTRAVENOUS | Status: DC

## 2019-08-27 MED ORDER — CALCIUM GLUCONATE-NACL 1-0.675 GM/50ML-% IV SOLN
1.0000 g | INTRAVENOUS | Status: AC
  Administered 2019-08-27: 1000 mg via INTRAVENOUS
  Filled 2019-08-27: qty 50

## 2019-08-27 MED ORDER — VASOPRESSIN 20 UNITS/100 ML INFUSION FOR SHOCK
0.0000 [IU]/min | INTRAVENOUS | Status: DC
  Administered 2019-08-27: 0.03 [IU]/min via INTRAVENOUS
  Filled 2019-08-27: qty 100

## 2019-08-27 MED FILL — Medication: Qty: 1 | Status: AC

## 2019-08-28 LAB — BPAM RBC
Blood Product Expiration Date: 202108082359
Blood Product Expiration Date: 202108082359
Blood Product Expiration Date: 202108142359
Blood Product Expiration Date: 202108162359
ISSUE DATE / TIME: 202107160105
ISSUE DATE / TIME: 202107160126
ISSUE DATE / TIME: 202107160848
ISSUE DATE / TIME: 202107161947
Unit Type and Rh: 6200
Unit Type and Rh: 6200
Unit Type and Rh: 6200
Unit Type and Rh: 6200

## 2019-08-28 LAB — TYPE AND SCREEN
ABO/RH(D): A POS
Antibody Screen: NEGATIVE
Unit division: 0
Unit division: 0
Unit division: 0
Unit division: 0

## 2019-08-29 LAB — PATHOLOGIST SMEAR REVIEW

## 2019-09-08 MED FILL — Vasopressin IV Soln 20 Unit/ML (For IV Infusion): INTRAVENOUS | Qty: 1 | Status: AC

## 2019-09-08 MED FILL — Sodium Chloride IV Soln 0.9%: INTRAVENOUS | Qty: 100 | Status: AC

## 2019-09-12 NOTE — Progress Notes (Signed)
Inpatient Rehabilitation Care Coordinator  Discharge Note  The overall goal for the admission was met for:   Discharge location: Pt expired on 09-06-2019.   Length of Stay: 8 days.   Discharge activity level: N/A  Home/community participation: N/A  Services provided included: MD, RD, PT, OT, RN, CM, TR, Pharmacy, Neuropsych and SW  Financial Services: Medicare  Follow-up services arranged: N/A  Comments (or additional information): N/A  Patient/Family verbalized understanding of follow-up arrangements: Yes  Individual responsible for coordination of the follow-up plan: N/A  Confirmed correct DME delivered: Rana Snare 06-Sep-2019    Rana Snare

## 2019-09-12 NOTE — Progress Notes (Signed)
The chaplain responded to a Code Blue. The chaplain was with the family as they arrived and offered emotional support. The chaplain was also present with the family as they discussed with the physician having the patient code status changed to DNR. The chaplain is available for continued support if needed.  Brion Aliment Chaplain Resident For questions concerning this note please contact me by pager [phone]

## 2019-09-12 NOTE — Procedures (Signed)
Intubation Procedure Note  [name]  [id number]  1934-08-01  Date:09-20-2019  Time:1:13 AM   Provider Performing:Jaydeen Odor E Burney Gauze    Procedure: Intubation (52841)  Indication(s) Respiratory Failure  Consent Unable to obtain consent due to emergent nature of procedure.   Anesthesia pt emergently intubated in code situation. MD at bedside during.    Time Out Verified patient identification, verified procedure, site/side was marked, verified correct patient position, special equipment/implants available, medications/allergies/relevant history reviewed, required imaging and test results available.   Sterile Technique Usual hand hygeine, masks, and gloves were used   Procedure Description Patient positioned in bed supine.  Sedation given as noted above.  Patient was intubated with endotracheal tube using Mac 4.  View was Grade 1 full glottis .  Number of attempts was 1.  Colorimetric CO2 detector was consistent with tracheal placement.   Complications/Tolerance None; patient tolerated the procedure well. Chest X-ray is ordered to verify placement.   EBL Minimal   Specimen(s) None

## 2019-09-12 NOTE — Progress Notes (Signed)
Per physician pt. Expired MD requested pt. Be extubated.

## 2019-09-12 NOTE — Progress Notes (Signed)
MD, NP currently attempting to place central line and a-line (presently unable to obtain ABG currently d/t this).

## 2019-09-12 NOTE — Progress Notes (Signed)
   08/26/19 2020  Assess: MEWS Score  Temp 98.4 F (36.9 C)  BP 131/81  Pulse Rate (!) 112  Resp 18  SpO2 100 %  O2 Device Non-rebreather Mask  O2 Flow Rate (L/min) 3 L/min  Assess: MEWS Score  MEWS Temp 0  MEWS Systolic 0  MEWS Pulse 2  MEWS RR 0  MEWS LOC 0  MEWS Score 2  MEWS Score Color Yellow  Assess: if the MEWS score is Yellow or Red  Were vital signs taken at a resting state? Yes  Focused Assessment Documented focused assessment  Early Detection of Sepsis Score *See Row Information* Low  MEWS guidelines implemented *See Row Information* Yes  Treat  MEWS Interventions Administered prn meds/treatments;Escalated (See documentation below);Consulted Respiratory Therapy  Take Vital Signs  Increase Vital Sign Frequency  Yellow: Q 2hr X 2 then Q 4hr X 2, if remains yellow, continue Q 4hrs  Escalate  MEWS: Escalate Yellow: discuss with charge nurse/RN and consider discussing with provider and RRT  Notify: Charge Nurse/RN  Name of Charge Nurse/RN Notified Sam   Date Charge Nurse/RN Notified 08/26/19  Notify: Provider  Provider Name/Title Marlowe Shores  Date Provider Notified 08/26/19  Time Provider Notified 2000  Notification Type Call  Notification Reason Change in status  Response See new orders  Date of Provider Response 08/26/19  Time of Provider Response 2030  Notify: Rapid Response  Name of Rapid Response RN Notified Mindy RN  Date Rapid Response Notified 08/26/19  Time Rapid Response Notified 1933 (Rapid in room already)  Document  Patient Outcome Not stable and remains on department

## 2019-09-12 NOTE — Code Documentation (Signed)
  Patient Name: [name]   MRN: [id number]   Date of Birth/ Sex: 02/03/1935 , female      Admission Date: 09/14/19  Attending Provider: Margaretha Seeds, MD  Primary Diagnosis: <principal problem not specified>   Indication: Pt was in her usual state of health until this PM, when she was noted to be unresponsive, pulseless, apneic. Code blue was subsequently called. At the time of arrival on scene, ACLS protocol was underway.   Technical Description:  - CPR performance duration:  39 minutes  - Was defibrillation or cardioversion used? No   - Was external pacer placed? No  - Was patient intubated pre/post CPR? Yes   Medications Administered: Y = Yes; Blank = No Amiodarone    Atropine    Calcium  Y  Epinephrine  Y  Lidocaine    Magnesium    Norepinephrine    Phenylephrine    Sodium bicarbonate  Y  Vasopressin     Post CPR evaluation:  - Final Status - Was patient successfully resuscitated ? Yes - What is current rhythm? AFib - What is current hemodynamic status? Requiring pressor to maintain BP  Miscellaneous Information:  - Labs sent, including: CBC, CMP, ABG, LA, trop, coags   - Primary team notified?  Yes  - Family Notified? Yes  - Additional notes/ transfer status: Recent blood work done just prior to code, Hgb 6.5. Transfusion ordered and was pending at onset of code. Received IVF during ACLS. Upon ROSC, transferred to ICU where another set of blood work ordered. Pending emergent blood transfusion, considering imaging for possible bleeding source. Of note, patient's MRN # changed with transfer from inpatient rehab to ICU.     Sanjuan Dame, MD  09/14/2019, 2:20 AM

## 2019-09-12 NOTE — Procedures (Signed)
Arterial Catheter Insertion Procedure Note  SHARLEY KEELER  [id number]  1934/08/28  Date:02-Sep-2019  Time:4:32 AM    Provider Performing: Kati Riggenbach Rodman Pickle    Procedure: Insertion of Arterial Line [id number]) with US guidance (94709)   Indication(s) Blood pressure monitoring and/or need for frequent ABGs  Consent Unable to obtain consent due to emergent nature of procedure.  Anesthesia None   Time Out Verified patient identification, verified procedure, site/side was marked, verified correct patient position, special equipment/implants available, medications/allergies/relevant history reviewed, required imaging and test results available.   Sterile Technique Maximal sterile technique including full sterile barrier drape, hand hygiene, sterile gown, sterile gloves, mask, hair covering, sterile ultrasound probe cover (if used).   Procedure Description Area of catheter insertion was cleaned with chlorhexidine and draped in sterile fashion. With real-time ultrasound guidance an arterial catheter was placed into the right femoral artery.  Appropriate arterial tracings confirmed on monitor.     Complications/Tolerance None; patient tolerated the procedure well.   EBL Minimal   Specimen(s) None

## 2019-09-12 NOTE — Procedures (Signed)
Central Venous Catheter Insertion Procedure Note  STEFANNIE DEFEO  [id number]  [birth year]  Date:09-25-2019  Time:4:30 AM   Provider Performing:Alese Furniss Rodman Pickle   Procedure: Insertion of Non-tunneled Central Venous Catheter(36556) with US guidance (14239)   Indication(s) Medication administration and Difficult access  Consent Unable to obtain consent due to emergent nature of procedure.  Timeout Verified patient identification, verified procedure, site/side was marked, verified correct patient position, special equipment/implants available, medications/allergies/relevant history reviewed, required imaging and test results available.  Sterile Technique Maximal sterile technique including full sterile barrier drape, hand hygiene, sterile gown, sterile gloves, mask, hair covering, sterile ultrasound probe cover (if used).  Procedure Description Area of catheter insertion was cleaned with chlorhexidine and draped in sterile fashion.  With real-time ultrasound guidance a central venous catheter was placed into the right femoral vein. Site chosen due to unsuccessful attempts in right and left IJ. Nonpulsatile blood flow and easy flushing noted in all ports.  The catheter was sutured in place and sterile dressing applied.  Complications/Tolerance None; patient tolerated the procedure well. Chest X-ray is ordered to verify placement for internal jugular or subclavian cannulation.   Chest x-ray is not ordered for femoral cannulation.  EBL Minimal  Specimen(s) None

## 2019-09-12 NOTE — Death Summary Note (Signed)
DEATH SUMMARY   Patient Details  Name: [name] MRN: [id number] DOB: [birth year]  Admission/Discharge Information   Admit Date:  September 23, 2019  Date of Death: Date of Death: 09-23-19  Time of Death: Time of Death: 0302  Length of Stay: 0  Referring Physician: Deland Pretty, MD   Reason(s) for Hospitalization  Hemorrhagic shock  Diagnoses  Preliminary cause of death: Hemorrhagic shock secondary to acute blood loss anemia secondary to fall Secondary Diagnoses (including complications and co-morbidities):  Active Problems:   Hemorrhagic shock (HCC) Acute blood loss anemia S/p cardiac arrest Acute hypoxemic and hypercarbic respiratory failure Severe metabolic acidosis secondary to lactic acidosis Acute renal failure  Brief Hospital Course (including significant findings, care, treatment, and services provided and events leading to death)  [name] is a 85 y.o. year old female with a past medical history significant for hypertension, obstructive sleep apnea on nightly CPAP, chronic atrial fibrillation on Coumadin, chronic diastolic congestive heart failure, morbid obesity, degenerative disc disease with chronic pain and history. In June, she recently had a fall with resultant humerus fracture requiring reverse total shoulder replacement on 07/20/2019. In the interim, she was re-admitted for for acute heart failure exacerbation two weeks ago. She was discharged to rehab and was posted for pending discharge however she suffered another fall at rehab on the same day as this admission and developed worsening shortness of breath. Code blue was called for PEA arrest. Patient intubated and transferred to ICU. On arrival, patient profoundly pale and hypotensive with bloody OGT contents. Labs included Hg 6.5. Patient was treated with IVF, PRBC, vasopressor support, sodium bicarbonate without improvement in hemodynamics. Given patient's grim prognosis, family agreed to transition to DNR. Patient  continued to decline despite aggressive measures. She expired and was compassionately extubated.   Pertinent Labs and Studies  Significant Diagnostic Studies DG Chest 2 View  Result Date: 08/19/2019 CLINICAL DATA:  Hypoxia EXAM: CHEST - 2 VIEW COMPARISON:  August 18, 2019 FINDINGS: There is stable cardiomegaly. The pulmonary vascularity is within normal limits. There is ill-defined airspace opacity in the left lower lung region with small left pleural effusion. There is slight right base atelectasis. There is aortic atherosclerosis. No adenopathy. There are total shoulder replacements bilaterally. IMPRESSION: Stable cardiomegaly. Left lower lobe airspace opacity with small left pleural effusion present. Slight right base atelectasis. Appearance similar to 1 day prior. Aortic Atherosclerosis (ICD10-I70.0). Electronically Signed   By: Lowella Grip III M.D.   On: 08/19/2019 08:07   DG Chest 2 View  Result Date: 08/15/2019 CLINICAL DATA:  Awoke from sleep with shortness of breath. EXAM: CHEST - 2 VIEW COMPARISON:  Radiograph 12/27/2018.  CT 07/13/2018 FINDINGS: Cardiomegaly. Diffuse interstitial and peribronchial thickening suspicious for pulmonary edema. Small bilateral pleural effusions and hazy lung base opacities. No pneumothorax. Bilateral shoulder arthroplasties. IMPRESSION: Cardiomegaly with pulmonary edema and small bilateral pleural effusions. Findings most consistent with CHF. Electronically Signed   By: Keith Rake M.D.   On: 08/15/2019 02:38   DG Shoulder Right  Result Date: 08/26/2019 CLINICAL DATA:  85 year old female status post fall. EXAM: RIGHT SHOULDER - 2+ VIEW COMPARISON:  Chest radiographs 08/19/2019 and earlier. FINDINGS: Previous right shoulder arthroplasty with anterior dislocation (image 2). Furthermore, the articular surface of the humeral component may be abnormally rotated (image 1). Regional osteopenia. Dystrophic calcifications. No superimposed acute fracture identified.  Stable visible right chest. IMPRESSION: Anterior dislocation of the prosthetic right glenohumeral joint, possibly also with malrotated humeral articular component. Electronically Signed  By: Genevie Ann M.D.   On: 08/26/2019 19:17   DG Chest Port 1 View  Result Date: 08/26/2019 CLINICAL DATA:  85 year old female with respiratory distress. Status post fall. EXAM: PORTABLE CHEST 1 VIEW COMPARISON:  Chest radiograph 08/19/2019 and earlier. Right shoulder series today. FINDINGS: Portable AP semi upright view at 2009 hours. Stable to mildly improved lung volumes. Stable cardiomegaly and mediastinal contours. Calcified aortic atherosclerosis. Visualized tracheal air column is within normal limits. Bilateral increased pulmonary interstitium appears chronic and stable. No pneumothorax, pulmonary edema or pleural effusion. Mild streaky opacity at the left lung base, although chronic hypo ventilation there. No other confluent opacity. Bilateral shoulder arthroplasty, that on the right was dislocated on the comparison shoulder series today. Osteopenia. No acute osseous abnormality identified. IMPRESSION: 1. Cardiomegaly with chronic pulmonary interstitial changes. 2. Patchy opacity at the left lung base, although chronic atelectasis here on prior exams. Electronically Signed   By: Genevie Ann M.D.   On: 08/26/2019 20:28   DG CHEST PORT 1 VIEW  Result Date: 08/18/2019 CLINICAL DATA:  Hypoxia EXAM: PORTABLE CHEST 1 VIEW COMPARISON:  Portable exam 0554 hours compared to 08/16/2019 FINDINGS: Enlargement of cardiac silhouette. Atherosclerotic calcification aorta. Mediastinal contours normal. BILATERAL pulmonary infiltrates which could represent pulmonary edema or atypical infection. More focal opacities are seen at the lung bases question confluent infiltrate versus atelectasis. Minimal LEFT pleural effusion. No pneumothorax. Bones demineralized with BILATERAL shoulder prostheses noted. IMPRESSION: Enlargement of cardiac silhouette  with favor pulmonary edema though atypical infection could have a similar pattern. Increased infiltrate versus atelectasis at lung bases with swelling of pleural effusion. Electronically Signed   By: Lavonia Dana M.D.   On: 08/18/2019 08:34   DG CHEST PORT 1 VIEW  Result Date: 08/16/2019 CLINICAL DATA:  Hypoxia. Patient status post right shoulder replacement 07/20/2019. EXAM: PORTABLE CHEST 1 VIEW COMPARISON:  Single-view of the chest 08/15/2019. PA and lateral chest 09/08/2018 FINDINGS: Marked cardiomegaly and interstitial edema persist. Bibasilar atelectasis and small effusions appear improved. Atherosclerosis noted. No pneumothorax. IMPRESSION: Improved bibasilar atelectasis and small effusions. No notable change in cardiomegaly and pulmonary edema. Electronically Signed   By: Inge Rise M.D.   On: 08/16/2019 12:16   DG Knee Complete 4 Views Right  Result Date: 08/26/2019 CLINICAL DATA:  85 year old female status post fall. EXAM: RIGHT KNEE - COMPLETE 4+ VIEW COMPARISON:  None. FINDINGS: Right total knee arthroplasty. Hardware appears intact and normally aligned. No definite joint effusion on the cross-table lateral view. Underlying osteopenia. No acute osseous abnormality identified. Calcified peripheral vascular disease. IMPRESSION: Right total knee arthroplasty. No acute osseous abnormality identified. Electronically Signed   By: Genevie Ann M.D.   On: 08/26/2019 19:15   DG Swallowing Func-Speech Pathology  Result Date: 08/16/2019 Objective Swallowing Evaluation: Type of Study: Bedside Swallow Evaluation  Patient Details Name: MAKELA NIEHOFF MRN: [id number] Date of Birth: [birth year] Today's Date: 08/16/2019 Time: SLP Start Time (ACUTE ONLY): 1030 -SLP Stop Time (ACUTE ONLY): 1047 SLP Time Calculation (min) (ACUTE ONLY): 17 min Past Medical History: Past Medical History: Diagnosis Date . Atrial fibrillation ([place])  . CAP (community acquired pneumonia) 12/27/2018 . Carotid artery occlusion  . Chronic  diastolic CHF (congestive heart failure) (HCC)   Hypertensive heart disease 02-12-14 . Crohn's disease ([place])  . Detached retina  . Fibromyalgia  . GERD (gastroesophageal reflux disease)  . Gout  . H/O hiatal hernia  . High triglycerides  . History of stomach ulcers 1950's . Hypertension  . Long term current  use of anticoagulant  . Obstructive sleep apnea on CPAP  . Osteoarthritis   PAIN AND OA LEF T HIP AND BOTH SHOULDERS ARE BONE ON BONE AND PAINFUL . Peripheral vascular disease (HCC)   KNOWN RIGHT INTERNAL CAROTID ARTERY OCCLUSION (NO STROKE)  --40 TO 59% STENOSIS LEFT ICA-FOLLOWED BY DR. EARLY WITH DOPPLER STUDY EVERY 6 MONTHS . Pulmonary embolism ([place]) 2011  a. Hx of PE in 02/2009 after R hip surgery, venous dopplers negative, long-term Coumadin. [name] Kitchen PVC (premature ventricular contraction)   PT STATES HX OF PVC'S ON EKG . Recurrent upper respiratory infection (URI)   BRONCHITIS FEB 2013--SLIGHT COUGH NON-PRODUCTIVE NOW . Recurrent UTI (urinary tract infection)   "on daily medicine" (05/14/2012) . Tinnitus  . Vertigo  Past Surgical History: Past Surgical History: Procedure Laterality Date . APPENDECTOMY  1950's . BACK SURGERY  10/29/06  Central and foraminal decompression L3-L4, L4-L5, and L5-S1 with inspection of L4-L5 and L5-S1 disc on the right . CARDIAC CATHETERIZATION  1970's  "maybe 2" (05/14/2012) . CARDIAC CATHETERIZATION   . CATARACT EXTRACTION W/ INTRAOCULAR LENS IMPLANT Right 2009 . CHOLECYSTECTOMY  ~ 1988 . DECOMPRESSIVE LUMBAR LAMINECTOMY LEVEL 3  ~ 2002 . DILATION AND CURETTAGE OF UTERUS    "several; from miscarriages" (05/14/2012) . EXCISION MORTON'S NEUROMA Right 05/20/00  "foot" (05/14/2012) . EYE SURGERY Left 2014  Detached retina . EYE SURGERY Left August 23, 2013  Cataract . FEMUR FRACTURE SURGERY Right 02/21/09  Open reduction internal fixation of right periprosthetic  femur fracture utilizing Zimmer cables times fiv . FRACTURE SURGERY  2011  Right Femur Fx . HAMMER TOE SURGERY Left 10/25/08  "toe next to big  to" (05/14/2012) . JOINT REPLACEMENT  2009  Right Hip replacement . JOINT REPLACEMENT  2012  Right knee replacement . JOINT REPLACEMENT  06/17/11  Left Hip replacement . KNEE ARTHROSCOPY Right 07/26/05 . REPLACEMENT TOTAL KNEE Right 02/19/2010 . REVISION TOTAL SHOULDER TO REVERSE TOTAL SHOULDER Right 07/20/2019  Procedure: REVERSE TOTAL SHOULDER REPLACEMENT;  Surgeon: Nicholes Stairs, MD;  Location: [facility];  Service: Orthopedics;  Laterality: Right; . SPINE SURGERY   . [place]? . TOTAL HIP ARTHROPLASTY Right 12/08/02  Osteonics total hip replacement . TOTAL HIP ARTHROPLASTY  06/17/2011  Procedure: TOTAL HIP ARTHROPLASTY;  Surgeon: Gearlean Alf, MD;  Location: [facility];  Service: Orthopedics;  Laterality: Left; . TOTAL SHOULDER ARTHROPLASTY Left 05/14/2012 . TOTAL SHOULDER ARTHROPLASTY Left 05/14/2012  Procedure: TOTAL SHOULDER ARTHROPLASTY;  Surgeon: Marin Shutter, MD;  Location: [facility];  Service: Orthopedics;  Laterality: Left; [name] Kitchen VAGINAL HYSTERECTOMY  1971 HPI: Pt is an 85 y.o. female with PMH of GERD, PUD, hypertension, hyperlipidemia, Carotid stenosis, Pafib, CHF (diastolic), OSA, h/o PE who presented with SOB and was admitted for CHF exacerbation. CXR 7/4: Cardiomegaly with pulmonary edema and small bilateral pleural effusions. Findings most consistent with CHF. WBC WNL. Pt is an 85 y.o. female with PMH of GERD, PUD, hypertension, hyperlipidemia, Carotid stenosis, Pafib, CHF (diastolic), OSA, h/o PE who presented with SOB and was admitted for CHF exacerbation. CXR 7/4: Cardiomegaly with pulmonary edema and small bilateral pleural effusions. Findings most consistent with CHF. WBC WNL.  No data recorded Assessment / Plan / Recommendation CHL IP CLINICAL IMPRESSIONS 08/16/2019 Clinical Impression Pt was seen in radiology suite for modified barium swallow study. Trials of puree solids, regular texture solids, a 27m barium tablet, and thin liquids via cup and straw were administered. Pt's  oropharyngeal swallow mechanism was within functional limits. Esophageal screening  revealed retention of boluses in the upper thoracic esophagus with delayed transport to the stomach. Backflow of this material to the pharynx was not observed during the study. However, considering the pt's report of increased reflux and increased signs of aspiration, the possibility of backflow of boluses retained in the thoracic esophagus being aspirated is strongly considered. It is recommended that the current diet be continued with GERD and swallowing precautions. Pt was advised to follow up with her gastroenterologist regarding her symptoms and she verbalized agreement with this recommendation.  SLP Visit Diagnosis Dysphagia, unspecified (R13.10) Attention and concentration deficit following -- Frontal lobe and executive function deficit following -- Impact on safety and function Mild aspiration risk   CHL IP TREATMENT RECOMMENDATION 08/16/2019 Treatment Recommendations No treatment recommended at this time   No flowsheet data found. CHL IP DIET RECOMMENDATION 08/16/2019 SLP Diet Recommendations Regular solids;Thin liquid Liquid Administration via Cup;Straw Medication Administration Whole meds with liquid Compensations Slow rate;Small sips/bites;Follow solids with liquid Postural Changes Remain semi-upright after after feeds/meals (Comment)   CHL IP OTHER RECOMMENDATIONS 08/16/2019 Recommended Consults -- Oral Care Recommendations Oral care BID Other Recommendations --   CHL IP FOLLOW UP RECOMMENDATIONS 08/16/2019 Follow up Recommendations None   CHL IP FREQUENCY AND DURATION 08/16/2019 Speech Therapy Frequency (ACUTE ONLY) min 2x/week Treatment Duration 1 week      CHL IP ORAL PHASE 08/16/2019 Oral Phase WFL Oral - Pudding Teaspoon -- Oral - Pudding Cup -- Oral - Honey Teaspoon -- Oral - Honey Cup -- Oral - Nectar Teaspoon -- Oral - Nectar Cup -- Oral - Nectar Straw -- Oral - Thin Teaspoon -- Oral - Thin Cup -- Oral - Thin Straw -- Oral -  Puree -- Oral - Mech Soft -- Oral - Regular -- Oral - Multi-Consistency -- Oral - Pill -- Oral Phase - Comment --  CHL IP PHARYNGEAL PHASE 08/16/2019 Pharyngeal Phase WFL Pharyngeal- Pudding Teaspoon -- Pharyngeal -- Pharyngeal- Pudding Cup -- Pharyngeal -- Pharyngeal- Honey Teaspoon -- Pharyngeal -- Pharyngeal- Honey Cup -- Pharyngeal -- Pharyngeal- Nectar Teaspoon -- Pharyngeal -- Pharyngeal- Nectar Cup -- Pharyngeal -- Pharyngeal- Nectar Straw -- Pharyngeal -- Pharyngeal- Thin Teaspoon -- Pharyngeal -- Pharyngeal- Thin Cup -- Pharyngeal -- Pharyngeal- Thin Straw -- Pharyngeal -- Pharyngeal- Puree -- Pharyngeal -- Pharyngeal- Mechanical Soft -- Pharyngeal -- Pharyngeal- Regular -- Pharyngeal -- Pharyngeal- Multi-consistency -- Pharyngeal -- Pharyngeal- Pill -- Pharyngeal -- Pharyngeal Comment --  CHL IP CERVICAL ESOPHAGEAL PHASE 08/16/2019 Cervical Esophageal Phase Impaired Pudding Teaspoon -- Pudding Cup -- Honey Teaspoon -- Honey Cup -- Nectar Teaspoon -- Nectar Cup -- Nectar Straw -- Thin Teaspoon -- Thin Cup -- Thin Straw -- Puree -- Mechanical Soft -- Regular -- Multi-consistency -- Pill -- Cervical Esophageal Comment See Clinical Impressions  Shanika I. Hardin Negus, [place], [place] Office number [phone] Pager [phone] Horton Marshall 08/16/2019, 12:40 PM              ECHOCARDIOGRAM COMPLETE  Result Date: 08/15/2019    ECHOCARDIOGRAM REPORT   Patient Name:   KHALI PERELLA Date of Exam: 08/15/2019 Medical Rec #:  [id number]     Height:       62.0 in Accession #:    [id number]    Weight:       200.6 lb Date of Birth:  23-Dec-1934     BSA:          1.914 m Patient Age:    85 years      BP:  118/43 mmHg Patient Gender: F             HR:           69 bpm. Exam Location:  Inpatient Procedure: 2D Echo, Cardiac Doppler and Color Doppler Indications:    Heart Failure  History:        Patient has prior history of Echocardiogram examinations, most                 recent 07/14/2018.  CHF, Arrythmias:Atrial Fibrillation and PVC;                 Risk Factors:Dyslipidemia and Hypertension. Morbid obesity, OSA                 on CPAP, H/o recurrent pulmonary embolism, on Coumadin.  Sonographer:    Alvino Chapel RCS Referring Phys: [phone] A CALDWELL Benitez  1. Left ventricular ejection fraction, by estimation, is 65 to 70%. The left ventricle has normal function. The left ventricle has no regional wall motion abnormalities. There is mild left ventricular hypertrophy. Left ventricular diastolic parameters are indeterminate.  2. Right ventricular systolic function is normal. The right ventricular size is normal. There is normal pulmonary artery systolic pressure. The estimated right ventricular systolic pressure is 62.3 mmHg.  3. The mitral valve is abnormal, mild to moderate annular calcification. Mild mitral valve regurgitation.  4. The aortic valve is tricuspid. Aortic valve regurgitation is not visualized.  5. The inferior vena cava is dilated in size with >50% respiratory variability, suggesting right atrial pressure of 8 mmHg.  6. Promiment circumferential pericardial fat pad. FINDINGS  Left Ventricle: Left ventricular ejection fraction, by estimation, is 65 to 70%. The left ventricle has normal function. The left ventricle has no regional wall motion abnormalities. The left ventricular internal cavity size was normal in size. There is  mild left ventricular hypertrophy. Left ventricular diastolic parameters are indeterminate. Right Ventricle: The right ventricular size is normal. No increase in right ventricular wall thickness. Right ventricular systolic function is normal. There is normal pulmonary artery systolic pressure. The tricuspid regurgitant velocity is 2.21 m/s, and  with an assumed right atrial pressure of 8 mmHg, the estimated right ventricular systolic pressure is 76.2 mmHg. Left Atrium: Left atrial size was normal in size. Right Atrium: Right atrial size was normal  in size. Pericardium: There is no evidence of pericardial effusion. Presence of pericardial fat pad. Mitral Valve: The mitral valve is abnormal. Mild to moderate mitral annular calcification. Mild mitral valve regurgitation. Tricuspid Valve: The tricuspid valve is grossly normal. Tricuspid valve regurgitation is mild. Aortic Valve: The aortic valve is tricuspid. Aortic valve regurgitation is not visualized. Mild aortic valve annular calcification. Aortic valve mean gradient measures 5.0 mmHg. Aortic valve peak gradient measures 9.5 mmHg. Aortic valve area, by VTI measures 2.35 cm. Pulmonic Valve: The pulmonic valve was grossly normal. Pulmonic valve regurgitation is trivial. Aorta: The aortic root is normal in size and structure. Venous: The inferior vena cava is dilated in size with greater than 50% respiratory variability, suggesting right atrial pressure of 8 mmHg. IAS/Shunts: No atrial level shunt detected by color flow Doppler.  LEFT VENTRICLE PLAX 2D LVIDd:         4.50 cm LVIDs:         2.60 cm LV PW:         1.10 cm LV IVS:        0.90 cm LVOT diam:     1.90 cm  LV SV:         67 LV SV Index:   35 LVOT Area:     2.84 cm  RIGHT VENTRICLE RV S prime:     9.03 cm/s TAPSE (M-mode): 1.8 cm LEFT ATRIUM             Index       RIGHT ATRIUM           Index LA diam:        4.80 cm 2.51 cm/m  RA Area:     22.50 cm LA Vol (A2C):   69.4 ml 36.26 ml/m RA Volume:   59.90 ml  31.29 ml/m LA Vol (A4C):   47.7 ml 24.92 ml/m LA Biplane Vol: 60.5 ml 31.61 ml/m  AORTIC VALVE AV Area (Vmax):    2.03 cm AV Area (Vmean):   2.02 cm AV Area (VTI):     2.35 cm AV Vmax:           154.00 cm/s AV Vmean:          101.000 cm/s AV VTI:            0.284 m AV Peak Grad:      9.5 mmHg AV Mean Grad:      5.0 mmHg LVOT Vmax:         110.00 cm/s LVOT Vmean:        72.000 cm/s LVOT VTI:          0.235 m LVOT/AV VTI ratio: 0.83  AORTA Ao Root diam: 3.10 cm MITRAL VALVE                TRICUSPID VALVE MV Area (PHT): 4.60 cm     TR Peak  grad:   19.5 mmHg MV Decel Time: 165 msec     TR Vmax:        221.00 cm/s MV E velocity: 140.00 cm/s                             SHUNTS                             Systemic VTI:  0.24 m                             Systemic Diam: 1.90 cm Rozann Lesches MD Electronically signed by Rozann Lesches MD Signature Date/Time: 08/15/2019/5:18:17 PM    Final    DG HIP UNILAT WITH PELVIS 1V RIGHT  Result Date: 08/26/2019 CLINICAL DATA:  85 year old female status post fall. EXAM: DG HIP (WITH OR WITHOUT PELVIS) 1V RIGHT COMPARISON:  Right hip series 02/17/2009. CT Abdomen and Pelvis 8218. FINDINGS: Chronic bilateral total hip arthroplasty. No pelvis fracture identified. Visible left hip hardware and proximal left femur appear grossly intact. Right hip total arthroplasty hardware appears intact and normally aligned. No acute osseous abnormality identified. Chronic lower lumbar posterior decompression. Negative visible lower abdominal and pelvic visceral contours. Bilateral femoral artery calcified atherosclerosis. IMPRESSION: Chronic bilateral total hip arthroplasty with no acute fracture or dislocation identified about the right hip or pelvis. Electronically Signed   By: Genevie Ann M.D.   On: 08/26/2019 19:14    Microbiology No results found for this or any previous visit (from the past 240 hour(s)).  Lab Basic Metabolic Panel: Recent Labs  Lab 08/20/19 0749 08/21/19 0616 08/22/19  0623 08/22/19 0612 08/23/19 0911 08/24/19 0615 08/26/19 2027 10-Sep-2019 0200 September 10, 2019 0216  NA 141   < > 141   < > 140 140 140 146* 143  K 3.6   < > 3.4*   < > 4.4 4.0 4.5 6.7* 6.5*  CL 95*   < > 98  --  100 98 104 109  --   CO2 37*   < > 33*  --  29 32 20* 9*  --   GLUCOSE 133*   < > 111*  --  132* 117* 384* 320*  --   BUN 21   < > 22  --  24* 23 33* 31*  --   CREATININE 1.00   < > 1.01*  --  1.13* 0.93 1.35* 1.93*  --   CALCIUM 8.8*   < > 8.5*  --  9.0 9.0 8.4* 8.6*  --   MG 1.9  --   --   --   --   --   --   --   --    < >  = values in this interval not displayed.   Liver Function Tests: Recent Labs  Lab 2019-09-10 0200  AST 969*  ALT 656*  ALKPHOS 124  BILITOT 0.8  PROT 3.0*  ALBUMIN 1.2*   No results for input(s): LIPASE, AMYLASE in the last 168 hours. No results for input(s): AMMONIA in the last 168 hours. CBC: Recent Labs  Lab 08/22/19 0612 08/23/19 0911 08/26/19 2027 09-10-19 0200 09-10-19 0216  WBC 4.5 5.5 11.4* 28.4*  --   HGB 8.4* 9.7* 6.5* 5.9* 5.8*  HCT 29.0* 34.2* 23.2* 22.4* 17.0*  MCV 95.1 94.5 95.5 105.2*  --   PLT 167 153 164 PLATELET CLUMPS NOTED ON SMEAR, UNABLE TO ESTIMATE  --    Cardiac Enzymes: No results for input(s): CKTOTAL, CKMB, CKMBINDEX, TROPONINI in the last 168 hours. Sepsis Labs: Recent Labs  Lab 08/22/19 0612 08/23/19 0911 08/26/19 2027 09/10/2019 0200  WBC 4.5 5.5 11.4* 28.4*  LATICACIDVEN  --   --   --  >11.0*    Procedures/Operations  ETT R femoral CVC R femoral arterial line   Leeona Mccardle Rodman Pickle 09/10/2019, 4:28 AM

## 2019-09-12 NOTE — H&P (Signed)
NAME:  [name], MRN:  [id number], DOB:  11/05/1934, LOS: 0 ADMISSION DATE:  09/03/19, CONSULTATION DATE:  08/26/2019 REFERRING MD:  Dr. Gillian Shields , CHIEF COMPLAINT:  Cardiac arrest   Brief History   85 yo female admitted from rehab for post-arrest with hemorrhagic shock from unknown etiology in the setting of anticoagulation  History of present illness   [name] is an 85 year old female with a past medical history significant for hypertension, obstructive sleep apnea on nightly CPAP, chronic atrial fibrillation on Coumadin, chronic diastolic congestive heart failure, morbid obesity, degenerative disc disease with chronic pain and history. In June, she recently had a fall with resultant humerus fracture requiring reverse total shoulder replacement on 07/20/2019. In the interim, she was re-admitted for for acute heart failure exacerbation two weeks ago. She was discharged to rehab and was posted for pending discharge however she suffered another fall at rehab on the same day as this admission and developed worsening shortness of breath. Code blue was called for PEA arrest. Patient intubated and transferred to ICU. On arrival, patient profoundly pale and hypotensive with bloody OGT contents. Labs included Hg 6.5. Two units PRBC emergently released and transfused.   Past Medical History  As noted above  Significant Hospital Events     Consults:    Procedures:  R femoral CVC R femoral arterial line  Significant Diagnostic Tests:    Micro Data:    Antimicrobials:    Interim history/subjective:  As above  Objective   Pulse (!) 145, resp. rate (!) 24.    Vent Mode: PRVC FiO2 (%):  [100 %] 100 % Set Rate:  [24 bmp] 24 bmp Vt Set:  [400 mL] 400 mL PEEP:  [8 cmH20] 8 cmH20 Plateau Pressure:  [22 cmH20] 22 cmH20  No intake or output data in the 24 hours ending 2019-09-03 0240 There were no vitals filed for this visit.  Physical Exam: General: Critically ill-appearing, pale,  obtunded, on mechanical ventilation HENT: [place], AT, ETT in place Eyes: EOMI, no scleral icterus Respiratory: Diminished breath sounds bilaterally. No crackles, wheezing or rales Cardiovascular: RRR, -M/R/G, no JVD GI: BS+, soft, nontender Extremities:-Edema,-tenderness Neuro: Unresponsive Skin: Scattered ecchymosis  Resolved Hospital Problem list     Assessment & Plan:  Hemorrhagic shock: Unclear source however suspect retroperitoneal bleed vs GIB in the setting of anticoagulation and recent fall. S/p 2U PRBC Acute blood loss anemia S/p cardiac arrest Acute hypoxemic and hypercarbic respiratory failure Severe metabolic acidosis secondary to lactic acisosi Acute renal failure  Patient was treated with IVF, PRBC, vasopressor support, sodium bicarbonate without improvement in hemodynamics. Given patient's grim prognosis, family agreed to transition to DNR. Patient continued to decline despite aggressive measures. She expired and was compassionately extubated.  Labs   CBC: Recent Labs  Lab 08/22/19 0612 08/23/19 0911 08/26/19 2027  WBC 4.5 5.5 11.4*  HGB 8.4* 9.7* 6.5*  HCT 29.0* 34.2* 23.2*  MCV 95.1 94.5 95.5  PLT 167 153 956    Basic Metabolic Panel: Recent Labs  Lab 08/20/19 0749 08/20/19 0749 08/21/19 0616 08/22/19 0612 08/23/19 0911 08/24/19 0615 08/26/19 2027  NA 141   < > 141 141 140 140 140  K 3.6   < > 3.6 3.4* 4.4 4.0 4.5  CL 95*   < > 97* 98 100 98 104  CO2 37*   < > 34* 33* 29 32 20*  GLUCOSE 133*   < > 112* 111* 132* 117* 384*  BUN 21   < >  24* 22 24* 23 33*  CREATININE 1.00   < > 1.13* 1.01* 1.13* 0.93 1.35*  CALCIUM 8.8*   < > 8.7* 8.5* 9.0 9.0 8.4*  MG 1.9  --   --   --   --   --   --    < > = values in this interval not displayed.   GFR: Estimated Creatinine Clearance: 32.3 mL/min (A) (by C-G formula based on SCr of 1.35 mg/dL (H)). Recent Labs  Lab 08/22/19 0612 08/23/19 0911 08/26/19 2027  WBC 4.5 5.5 11.4*    Liver Function Tests: No  results for input(s): AST, ALT, ALKPHOS, BILITOT, PROT, ALBUMIN in the last 168 hours. No results for input(s): LIPASE, AMYLASE in the last 168 hours. No results for input(s): AMMONIA in the last 168 hours.  ABG    Component Value Date/Time   PHART 7.378 08/26/2019 2105   PCO2ART 31.0 (L) 08/26/2019 2105   PO2ART 189 (H) 08/26/2019 2105   HCO3 17.8 (L) 08/26/2019 2105   TCO2 24 07/20/2019 1533   ACIDBASEDEF 6.4 (H) 08/26/2019 2105   O2SAT 99.4 08/26/2019 2105     Coagulation Profile: Recent Labs  Lab 08/22/19 0612 08/23/19 1150 08/24/19 0615 08/25/19 0734 08/26/19 0703  INR 3.6* 2.9* 2.9* 2.6* 2.4*    Cardiac Enzymes: No results for input(s): CKTOTAL, CKMB, CKMBINDEX, TROPONINI in the last 168 hours.  HbA1C: Hgb A1c MFr Bld  Date/Time Value Ref Range Status  07/15/2018 06:04 AM 5.2 4.8 - 5.6 % Final    Comment:    (NOTE) Pre diabetes:          5.7%-6.4% Diabetes:              >6.4% Glycemic control for   <7.0% adults with diabetes     CBG: Recent Labs  Lab 08/26/19 1935  GLUCAP 296*    Review of Systems:   Unable to obtain due to critical illness  Past Medical History  She,  has a past medical history of Atrial fibrillation ([place]), CAP (community acquired pneumonia) (12/27/2018), Carotid artery occlusion, Chronic diastolic CHF (congestive heart failure) ([place]), Crohn's disease ([place]), Detached retina, Fibromyalgia, GERD (gastroesophageal reflux disease), Gout, H/O hiatal hernia, High triglycerides, History of stomach ulcers (1950's), Hypertension, Long term current use of anticoagulant, Obstructive sleep apnea on CPAP, Osteoarthritis, Peripheral vascular disease ([place]), Pulmonary embolism ([place]) (2011), PVC (premature ventricular contraction), Recurrent upper respiratory infection (URI), Recurrent UTI (urinary tract infection), Tinnitus, and Vertigo.   Surgical History    Past Surgical History:  Procedure Laterality Date   APPENDECTOMY  1950's   BACK SURGERY   10/29/06   Central and foraminal decompression L3-L4, L4-L5, and L5-S1 with inspection of L4-L5 and L5-S1 disc on the right   CARDIAC CATHETERIZATION  1970's   "maybe 2" (05/14/2012)   CARDIAC CATHETERIZATION     CATARACT EXTRACTION W/ INTRAOCULAR LENS IMPLANT Right 2009   CHOLECYSTECTOMY  ~ [place] LEVEL 3  ~ 2002   DILATION AND CURETTAGE OF UTERUS     "several; from miscarriages" (05/14/2012)   EXCISION MORTON'S NEUROMA Right 05/20/00   "foot" (05/14/2012)   EYE SURGERY Left 2014   Detached retina   EYE SURGERY Left August 23, 2013   Cataract   FEMUR FRACTURE SURGERY Right 02/21/09   Open reduction internal fixation of right periprosthetic  femur fracture utilizing Zimmer cables times fiv   FRACTURE SURGERY  2011   Right Femur Fx   HAMMER TOE SURGERY Left 10/25/08   "  toe next to big to" (05/14/2012)   JOINT REPLACEMENT  2009   Right Hip replacement   JOINT REPLACEMENT  2012   Right knee replacement   JOINT REPLACEMENT  06/17/11   Left Hip replacement   KNEE ARTHROSCOPY Right 07/26/05   REPLACEMENT TOTAL KNEE Right 02/19/2010   REVISION TOTAL SHOULDER TO REVERSE TOTAL SHOULDER Right 07/20/2019   Procedure: REVERSE TOTAL SHOULDER REPLACEMENT;  Surgeon: Nicholes Stairs, MD;  Location: [facility];  Service: Orthopedics;  Laterality: Right;   [place]?   TOTAL HIP ARTHROPLASTY Right 12/08/02   Osteonics total hip replacement   TOTAL HIP ARTHROPLASTY  06/17/2011   Procedure: TOTAL HIP ARTHROPLASTY;  Surgeon: Gearlean Alf, MD;  Location: [facility];  Service: Orthopedics;  Laterality: Left;   TOTAL SHOULDER ARTHROPLASTY Left 05/14/2012   TOTAL SHOULDER ARTHROPLASTY Left 05/14/2012   Procedure: TOTAL SHOULDER ARTHROPLASTY;  Surgeon: Marin Shutter, MD;  Location: [facility];  Service: Orthopedics;  Laterality: Left;   VAGINAL HYSTERECTOMY  1971     Social History   reports that she quit smoking about 33  years ago. Her smoking use included cigarettes. She has a 7.50 pack-year smoking history. She has never used smokeless tobacco. She reports current alcohol use. She reports that she does not use drugs.   Family History   Her family history includes AAA (abdominal aortic aneurysm) in her mother; Aneurysm in her mother; Diabetes in her paternal grandfather and son; Heart attack in her brother; Heart attack (age of onset: 74) in her father; Heart disease in her brother, father, and mother; Hyperlipidemia in her brother; Hypertension in her brother, brother, father, and mother; Peripheral vascular disease in her father; Stroke in her mother.   Allergies Allergies  Allergen Reactions   Codeine     NAUSEA   Cymbalta [Duloxetine Hcl]     Nausea VOMITING AND ABDOMINAL PAIN, HEADACHE, JUST ABOUT EVERY SIDE EFFECT THE DRUG HAS   Diltiazem Cd [Diltiazem Hcl Er Beads]     Heavy legs, cough   Duloxetine     Other reaction(s): Other (See Comments) Nausea VOMITING AND ABDOMINAL PAIN, HEADACHE, JUST ABOUT EVERY SIDE EFFECT THE DRUG HAS   Metoprolol     Legs like cement   Mirtazapine Other (See Comments)    nightmares   Nortriptyline     Nightmares   Penicillins     RASH  & ITCHING   --PT STATES SHE CAN TAKE KEFLEX PO AND IV CEPHALOSPORINS    Tolerate IV ancef on 07/20/2019   Statins     Leg cramps   Tetanus Toxoids     SWELLING, REDNESS  WHOLE ARM   Ciprofloxacin Rash    RASH   Lyrica [Pregabalin] Diarrhea, Nausea And Vomiting and Rash     Home Medications  Prior to Admission medications   Medication Sig Start Date End Date Taking? Authorizing Provider  allopurinol (ZYLOPRIM) 300 MG tablet Take 300 mg by mouth daily. 06/09/16   [provider]  amLODipine (NORVASC) 5 MG tablet Take 5 mg by mouth daily. 04/24/18   [provider]  benzonatate (TESSALON) 100 MG capsule Take 100 mg by mouth 3 (three) times daily as needed for cough.    [provider]    brimonidine (ALPHAGAN) 0.2 % ophthalmic solution Place 1 drop into the right eye 2 (two) times daily.  03/20/18   [provider]  cholecalciferol (VITAMIN D3) 25 MCG (1000 UNIT)  tablet Take 1,000 Units by mouth daily.    [provider]  cimetidine (TAGAMET) 200 MG tablet Take 200 mg by mouth 2 (two) times daily.    [provider]  CRANBERRY CONCENTRATE PO Take 1 tablet by mouth daily.    [provider]  dorzolamide-timolol (COSOPT) 22.3-6.8 MG/ML ophthalmic solution Place 1 drop into both eyes 2 (two) times daily.  06/28/13   [provider]  furosemide (LASIX) 40 MG tablet Take 1 tablet (40 mg total) by mouth daily. 08/19/19   Kathie Dike, MD  HYDROcodone-acetaminophen (NORCO/VICODIN) 5-325 MG tablet Take 1 tablet by mouth every 4 (four) hours as needed for moderate pain. 08/18/19 08/17/20  Kathie Dike, MD  ipratropium (ATROVENT) 0.06 % nasal spray Place 1 spray into both nostrils daily as needed for rhinitis.  04/13/18   [provider]  ketoconazole (NIZORAL) 2 % cream Apply 1 application topically See admin instructions. Use 1 to 2 times daily 08/02/19   [provider]  loratadine (CLARITIN) 10 MG tablet Take 10 mg by mouth daily as needed for allergies.    [provider]  LUMIGAN 0.01 % SOLN Place 1 drop into both eyes at bedtime. 09/04/16   [provider]  Multiple Vitamin (MULTIVITAMIN WITH MINERALS) TABS tablet Take 1 tablet by mouth daily.    [provider]  nebivolol (BYSTOLIC) 10 MG tablet Take 1 tablet (10 mg total) by mouth at bedtime. 05/19/18   [place], Peter M, MD  pantoprazole (PROTONIX) 40 MG tablet Take 1 tablet (40 mg total) by mouth daily. 08/19/19   Kathie Dike, MD  potassium chloride (KLOR-CON) 10 MEQ tablet Take 2 tablets (20 mEq total) by mouth daily. 12/31/18 08/15/19  Rai, Ripudeep K, MD  RHOPRESSA 0.02 % SOLN Place 1 drop into the right eye at bedtime. 07/13/19   [provider]  triamcinolone (NASACORT ALLERGY 24HR) 55 MCG/ACT AERO nasal inhaler Place 2 sprays into the nose daily as needed (allergies).     [provider]  warfarin (COUMADIN) 2.5 MG tablet Take 1 tablet (2.5 mg total) by mouth See admin instructions. Take 3.75 mg on Mondays & Fridays. Take 2.5 mg on all other days 08/18/19   Kathie Dike, MD     Critical care time: 120 min    The patient is critically ill with multiple organ systems failure and requires high complexity decision making for assessment and support, frequent evaluation and titration of therapies, application of advanced monitoring technologies and extensive interpretation of multiple databases.   Rodman Pickle, M.D. [facility] Pulmonary/Critical Care Medicine 09-Sep-2019 4:27 AM   Please see Amion for pager number to reach on-call Pulmonary and Critical Care Team.

## 2019-09-12 NOTE — Discharge Summary (Signed)
Physician Discharge Summary  Patient ID: [name] MRN: [id number] DOB/AGE: 10/31/1934 85 y.o.  Admit date: 08/18/2019 Discharge date: 09/15/2019  Discharge Diagnoses:  Principal Problem:   Cardiopulmonary arrest [facility]) Active Problems:   Physical debility   Acute blood loss anemia   AKI (acute kidney injury) ([place])   Sleep disturbance   Postoperative pain   Supplemental oxygen dependent   Chronic pain syndrome   Discharged Condition: Unstable    Significant Diagnostic Studies: DG Chest 2 View  Result Date: 08/19/2019 CLINICAL DATA:  Hypoxia EXAM: CHEST - 2 VIEW COMPARISON:  August 18, 2019 FINDINGS: There is stable cardiomegaly. The pulmonary vascularity is within normal limits. There is ill-defined airspace opacity in the left lower lung region with small left pleural effusion. There is slight right base atelectasis. There is aortic atherosclerosis. No adenopathy. There are total shoulder replacements bilaterally. IMPRESSION: Stable cardiomegaly. Left lower lobe airspace opacity with small left pleural effusion present. Slight right base atelectasis. Appearance similar to 1 day prior. Aortic Atherosclerosis (ICD10-I70.0). Electronically Signed   By: Lowella Grip III M.D.   On: 08/19/2019 08:07   DG Chest 2 View  Result Date: 08/15/2019 CLINICAL DATA:  Awoke from sleep with shortness of breath. EXAM: CHEST - 2 VIEW COMPARISON:  Radiograph 12/27/2018.  CT 07/13/2018 FINDINGS: Cardiomegaly. Diffuse interstitial and peribronchial thickening suspicious for pulmonary edema. Small bilateral pleural effusions and hazy lung base opacities. No pneumothorax. Bilateral shoulder arthroplasties. IMPRESSION: Cardiomegaly with pulmonary edema and small bilateral pleural effusions. Findings most consistent with CHF. Electronically Signed   By: Keith Rake M.D.   On: 08/15/2019 02:38   DG Shoulder Right  Result Date: 08/26/2019 CLINICAL DATA:  85 year old female status post fall. EXAM: RIGHT  SHOULDER - 2+ VIEW COMPARISON:  Chest radiographs 08/19/2019 and earlier. FINDINGS: Previous right shoulder arthroplasty with anterior dislocation (image 2). Furthermore, the articular surface of the humeral component may be abnormally rotated (image 1). Regional osteopenia. Dystrophic calcifications. No superimposed acute fracture identified. Stable visible right chest. IMPRESSION: Anterior dislocation of the prosthetic right glenohumeral joint, possibly also with malrotated humeral articular component. Electronically Signed   By: Genevie Ann M.D.   On: 08/26/2019 19:17   DG Chest Port 1 View  Result Date: 08/26/2019 CLINICAL DATA:  85 year old female with respiratory distress. Status post fall. EXAM: PORTABLE CHEST 1 VIEW COMPARISON:  Chest radiograph 08/19/2019 and earlier. Right shoulder series today. FINDINGS: Portable AP semi upright view at 2009 hours. Stable to mildly improved lung volumes. Stable cardiomegaly and mediastinal contours. Calcified aortic atherosclerosis. Visualized tracheal air column is within normal limits. Bilateral increased pulmonary interstitium appears chronic and stable. No pneumothorax, pulmonary edema or pleural effusion. Mild streaky opacity at the left lung base, although chronic hypo ventilation there. No other confluent opacity. Bilateral shoulder arthroplasty, that on the right was dislocated on the comparison shoulder series today. Osteopenia. No acute osseous abnormality identified. IMPRESSION: 1. Cardiomegaly with chronic pulmonary interstitial changes. 2. Patchy opacity at the left lung base, although chronic atelectasis here on prior exams. Electronically Signed   By: Genevie Ann M.D.   On: 08/26/2019 20:28   DG CHEST PORT 1 VIEW  Result Date: 08/18/2019 CLINICAL DATA:  Hypoxia EXAM: PORTABLE CHEST 1 VIEW COMPARISON:  Portable exam 0554 hours compared to 08/16/2019 FINDINGS: Enlargement of cardiac silhouette. Atherosclerotic calcification aorta. Mediastinal contours normal.  BILATERAL pulmonary infiltrates which could represent pulmonary edema or atypical infection. More focal opacities are seen at the lung bases question confluent infiltrate versus atelectasis.  Minimal LEFT pleural effusion. No pneumothorax. Bones demineralized with BILATERAL shoulder prostheses noted. IMPRESSION: Enlargement of cardiac silhouette with favor pulmonary edema though atypical infection could have a similar pattern. Increased infiltrate versus atelectasis at lung bases with swelling of pleural effusion. Electronically Signed   By: Lavonia Dana M.D.   On: 08/18/2019 08:34   DG CHEST PORT 1 VIEW  Result Date: 08/16/2019 CLINICAL DATA:  Hypoxia. Patient status post right shoulder replacement 07/20/2019. EXAM: PORTABLE CHEST 1 VIEW COMPARISON:  Single-view of the chest 08/15/2019. PA and lateral chest 09/08/2018 FINDINGS: Marked cardiomegaly and interstitial edema persist. Bibasilar atelectasis and small effusions appear improved. Atherosclerosis noted. No pneumothorax. IMPRESSION: Improved bibasilar atelectasis and small effusions. No notable change in cardiomegaly and pulmonary edema. Electronically Signed   By: Inge Rise M.D.   On: 08/16/2019 12:16   DG Knee Complete 4 Views Right  Result Date: 08/26/2019 CLINICAL DATA:  85 year old female status post fall. EXAM: RIGHT KNEE - COMPLETE 4+ VIEW COMPARISON:  None. FINDINGS: Right total knee arthroplasty. Hardware appears intact and normally aligned. No definite joint effusion on the cross-table lateral view. Underlying osteopenia. No acute osseous abnormality identified. Calcified peripheral vascular disease. IMPRESSION: Right total knee arthroplasty. No acute osseous abnormality identified. Electronically Signed   By: Genevie Ann M.D.   On: 08/26/2019 19:15   DG Swallowing Func-Speech Pathology  Result Date: 08/16/2019 Objective Swallowing Evaluation: Type of Study: Bedside Swallow Evaluation  Patient Details Name: [name] MRN: [id number]  Date of Birth: 1934-09-06 Today's Date: 08/16/2019 Time: SLP Start Time (ACUTE ONLY): 1030 -SLP Stop Time (ACUTE ONLY): 1047 SLP Time Calculation (min) (ACUTE ONLY): 17 min Past Medical History: Past Medical History: Diagnosis Date . Atrial fibrillation ([place])  . CAP (community acquired pneumonia) 12/27/2018 . Carotid artery occlusion  . Chronic diastolic CHF (congestive heart failure) (HCC)   Hypertensive heart disease 02-12-14 . Crohn's disease ([place])  . Detached retina  . Fibromyalgia  . GERD (gastroesophageal reflux disease)  . Gout  . H/O hiatal hernia  . High triglycerides  . History of stomach ulcers 1950's . Hypertension  . Long term current use of anticoagulant  . Obstructive sleep apnea on CPAP  . Osteoarthritis   PAIN AND OA LEF T HIP AND BOTH SHOULDERS ARE BONE ON BONE AND PAINFUL . Peripheral vascular disease (HCC)   KNOWN RIGHT INTERNAL CAROTID ARTERY OCCLUSION (NO STROKE)  --40 TO 59% STENOSIS LEFT ICA-FOLLOWED BY DR. EARLY WITH DOPPLER STUDY EVERY 6 MONTHS . Pulmonary embolism ([place]) 2011  a. Hx of PE in 02/2009 after R hip surgery, venous dopplers negative, long-term Coumadin. [name] Kitchen PVC (premature ventricular contraction)   PT STATES HX OF PVC'S ON EKG . Recurrent upper respiratory infection (URI)   BRONCHITIS FEB 2013--SLIGHT COUGH NON-PRODUCTIVE NOW . Recurrent UTI (urinary tract infection)   "on daily medicine" (05/14/2012) . Tinnitus  . Vertigo  Past Surgical History: Past Surgical History: Procedure Laterality Date . APPENDECTOMY  1950's . BACK SURGERY  10/29/06  Central and foraminal decompression L3-L4, L4-L5, and L5-S1 with inspection of L4-L5 and L5-S1 disc on the right . CARDIAC CATHETERIZATION  1970's  "maybe 2" (05/14/2012) . CARDIAC CATHETERIZATION   . CATARACT EXTRACTION W/ INTRAOCULAR LENS IMPLANT Right 2009 . CHOLECYSTECTOMY  ~ 1988 . DECOMPRESSIVE LUMBAR LAMINECTOMY LEVEL 3  ~ 2002 . DILATION AND CURETTAGE OF UTERUS    "several; from miscarriages" (05/14/2012) . EXCISION MORTON'S NEUROMA Right 05/20/00   "foot" (05/14/2012) . EYE SURGERY Left 2014  Detached retina . EYE  SURGERY Left August 23, 2013  Cataract . FEMUR FRACTURE SURGERY Right 02/21/09  Open reduction internal fixation of right periprosthetic  femur fracture utilizing Zimmer cables times fiv . FRACTURE SURGERY  2011  Right Femur Fx . HAMMER TOE SURGERY Left 10/25/08  "toe next to big to" (05/14/2012) . JOINT REPLACEMENT  2009  Right Hip replacement . JOINT REPLACEMENT  2012  Right knee replacement . JOINT REPLACEMENT  06/17/11  Left Hip replacement . KNEE ARTHROSCOPY Right 07/26/05 . REPLACEMENT TOTAL KNEE Right 02/19/2010 . REVISION TOTAL SHOULDER TO REVERSE TOTAL SHOULDER Right 07/20/2019  Procedure: REVERSE TOTAL SHOULDER REPLACEMENT;  Surgeon: Nicholes Stairs, MD;  Location: [place];  Service: Orthopedics;  Laterality: Right; . SPINE SURGERY   . [place]? . TOTAL HIP ARTHROPLASTY Right 12/08/02  Osteonics total hip replacement . TOTAL HIP ARTHROPLASTY  06/17/2011  Procedure: TOTAL HIP ARTHROPLASTY;  Surgeon: Gearlean Alf, MD;  Location: [facility];  Service: Orthopedics;  Laterality: Left; . TOTAL SHOULDER ARTHROPLASTY Left 05/14/2012 . TOTAL SHOULDER ARTHROPLASTY Left 05/14/2012  Procedure: TOTAL SHOULDER ARTHROPLASTY;  Surgeon: Marin Shutter, MD;  Location: [place];  Service: Orthopedics;  Laterality: Left; [name] Kitchen VAGINAL HYSTERECTOMY  1971 HPI: Pt is an 85 y.o. female with PMH of GERD, PUD, hypertension, hyperlipidemia, Carotid stenosis, Pafib, CHF (diastolic), OSA, h/o PE who presented with SOB and was admitted for CHF exacerbation. CXR 7/4: Cardiomegaly with pulmonary edema and small bilateral pleural effusions. Findings most consistent with CHF. WBC WNL. Pt is an 85 y.o. female with PMH of GERD, PUD, hypertension, hyperlipidemia, Carotid stenosis, Pafib, CHF (diastolic), OSA, h/o PE who presented with SOB and was admitted for CHF exacerbation. CXR 7/4: Cardiomegaly with pulmonary edema and small bilateral pleural effusions. Findings most  consistent with CHF. WBC WNL.  No data recorded Assessment / Plan / Recommendation CHL IP CLINICAL IMPRESSIONS 08/16/2019 Clinical Impression Pt was seen in radiology suite for modified barium swallow study. Trials of puree solids, regular texture solids, a 19m barium tablet, and thin liquids via cup and straw were administered. Pt's oropharyngeal swallow mechanism was within functional limits. Esophageal screening revealed retention of boluses in the upper thoracic esophagus with delayed transport to the stomach. Backflow of this material to the pharynx was not observed during the study. However, considering the pt's report of increased reflux and increased signs of aspiration, the possibility of backflow of boluses retained in the thoracic esophagus being aspirated is strongly considered. It is recommended that the current diet be continued with GERD and swallowing precautions. Pt was advised to follow up with her gastroenterologist regarding her symptoms and she verbalized agreement with this recommendation.  SLP Visit Diagnosis Dysphagia, unspecified (R13.10) Attention and concentration deficit following -- Frontal lobe and executive function deficit following -- Impact on safety and function Mild aspiration risk   CHL IP TREATMENT RECOMMENDATION 08/16/2019 Treatment Recommendations No treatment recommended at this time   No flowsheet data found. CHL IP DIET RECOMMENDATION 08/16/2019 SLP Diet Recommendations Regular solids;Thin liquid Liquid Administration via Cup;Straw Medication Administration Whole meds with liquid Compensations Slow rate;Small sips/bites;Follow solids with liquid Postural Changes Remain semi-upright after after feeds/meals (Comment)   CHL IP OTHER RECOMMENDATIONS 08/16/2019 Recommended Consults -- Oral Care Recommendations Oral care BID Other Recommendations --   CHL IP FOLLOW UP RECOMMENDATIONS 08/16/2019 Follow up Recommendations None   CHL IP FREQUENCY AND DURATION 08/16/2019 Speech Therapy  Frequency (ACUTE ONLY) min 2x/week Treatment Duration 1 week      CHL IP ORAL  PHASE 08/16/2019 Oral Phase WFL Oral - Pudding Teaspoon -- Oral - Pudding Cup -- Oral - Honey Teaspoon -- Oral - Honey Cup -- Oral - Nectar Teaspoon -- Oral - Nectar Cup -- Oral - Nectar Straw -- Oral - Thin Teaspoon -- Oral - Thin Cup -- Oral - Thin Straw -- Oral - Puree -- Oral - Mech Soft -- Oral - Regular -- Oral - Multi-Consistency -- Oral - Pill -- Oral Phase - Comment --  CHL IP PHARYNGEAL PHASE 08/16/2019 Pharyngeal Phase WFL Pharyngeal- Pudding Teaspoon -- Pharyngeal -- Pharyngeal- Pudding Cup -- Pharyngeal -- Pharyngeal- Honey Teaspoon -- Pharyngeal -- Pharyngeal- Honey Cup -- Pharyngeal -- Pharyngeal- Nectar Teaspoon -- Pharyngeal -- Pharyngeal- Nectar Cup -- Pharyngeal -- Pharyngeal- Nectar Straw -- Pharyngeal -- Pharyngeal- Thin Teaspoon -- Pharyngeal -- Pharyngeal- Thin Cup -- Pharyngeal -- Pharyngeal- Thin Straw -- Pharyngeal -- Pharyngeal- Puree -- Pharyngeal -- Pharyngeal- Mechanical Soft -- Pharyngeal -- Pharyngeal- Regular -- Pharyngeal -- Pharyngeal- Multi-consistency -- Pharyngeal -- Pharyngeal- Pill -- Pharyngeal -- Pharyngeal Comment --  CHL IP CERVICAL ESOPHAGEAL PHASE 08/16/2019 Cervical Esophageal Phase Impaired Pudding Teaspoon -- Pudding Cup -- Honey Teaspoon -- Honey Cup -- Nectar Teaspoon -- Nectar Cup -- Nectar Straw -- Thin Teaspoon -- Thin Cup -- Thin Straw -- Puree -- Mechanical Soft -- Regular -- Multi-consistency -- Pill -- Cervical Esophageal Comment See Clinical Impressions  [name], [place], [place] Office number [phone] Pager [phone] Horton Marshall 08/16/2019, 12:40 PM              ECHOCARDIOGRAM COMPLETE  Result Date: 08/15/2019    ECHOCARDIOGRAM REPORT   Patient Name:   [name] Date of Exam: 08/15/2019 Medical Rec #:  [id number]     Height:       62.0 in Accession #:    [id number]    Weight:       200.6 lb Date of Birth:  October 07, 1934     BSA:           1.914 m Patient Age:    17 years      BP:           118/43 mmHg Patient Gender: F             HR:           69 bpm. Exam Location:  Inpatient Procedure: 2D Echo, Cardiac Doppler and Color Doppler Indications:    Heart Failure  History:        Patient has prior history of Echocardiogram examinations, most                 recent 07/14/2018. CHF, Arrythmias:Atrial Fibrillation and PVC;                 Risk Factors:Dyslipidemia and Hypertension. Morbid obesity, OSA                 on CPAP, H/o recurrent pulmonary embolism, on Coumadin.  Sonographer:    Alvino Chapel RCS Referring Phys: [phone] A CALDWELL [place]  1. Left ventricular ejection fraction, by estimation, is 65 to 70%. The left ventricle has normal function. The left ventricle has no regional wall motion abnormalities. There is mild left ventricular hypertrophy. Left ventricular diastolic parameters are indeterminate.  2. Right ventricular systolic function is normal. The right ventricular size is normal. There is normal pulmonary artery systolic pressure. The estimated right ventricular systolic pressure is 37.1 mmHg.  3. The mitral valve  is abnormal, mild to moderate annular calcification. Mild mitral valve regurgitation.  4. The aortic valve is tricuspid. Aortic valve regurgitation is not visualized.  5. The inferior vena cava is dilated in size with >50% respiratory variability, suggesting right atrial pressure of 8 mmHg.  6. Promiment circumferential pericardial fat pad. FINDINGS  Left Ventricle: Left ventricular ejection fraction, by estimation, is 65 to 70%. The left ventricle has normal function. The left ventricle has no regional wall motion abnormalities. The left ventricular internal cavity size was normal in size. There is  mild left ventricular hypertrophy. Left ventricular diastolic parameters are indeterminate. Right Ventricle: The right ventricular size is normal. No increase in right ventricular wall thickness. Right ventricular  systolic function is normal. There is normal pulmonary artery systolic pressure. The tricuspid regurgitant velocity is 2.21 m/s, and  with an assumed right atrial pressure of 8 mmHg, the estimated right ventricular systolic pressure is 46.2 mmHg. Left Atrium: Left atrial size was normal in size. Right Atrium: Right atrial size was normal in size. Pericardium: There is no evidence of pericardial effusion. Presence of pericardial fat pad. Mitral Valve: The mitral valve is abnormal. Mild to moderate mitral annular calcification. Mild mitral valve regurgitation. Tricuspid Valve: The tricuspid valve is grossly normal. Tricuspid valve regurgitation is mild. Aortic Valve: The aortic valve is tricuspid. Aortic valve regurgitation is not visualized. Mild aortic valve annular calcification. Aortic valve mean gradient measures 5.0 mmHg. Aortic valve peak gradient measures 9.5 mmHg. Aortic valve area, by VTI measures 2.35 cm. Pulmonic Valve: The pulmonic valve was grossly normal. Pulmonic valve regurgitation is trivial. Aorta: The aortic root is normal in size and structure. Venous: The inferior vena cava is dilated in size with greater than 50% respiratory variability, suggesting right atrial pressure of 8 mmHg. IAS/Shunts: No atrial level shunt detected by color flow Doppler.  LEFT VENTRICLE PLAX 2D LVIDd:         4.50 cm LVIDs:         2.60 cm LV PW:         1.10 cm LV IVS:        0.90 cm LVOT diam:     1.90 cm LV SV:         67 LV SV Index:   35 LVOT Area:     2.84 cm  RIGHT VENTRICLE RV S prime:     9.03 cm/s TAPSE (M-mode): 1.8 cm LEFT ATRIUM             Index       RIGHT ATRIUM           Index LA diam:        4.80 cm 2.51 cm/m  RA Area:     22.50 cm LA Vol (A2C):   69.4 ml 36.26 ml/m RA Volume:   59.90 ml  31.29 ml/m LA Vol (A4C):   47.7 ml 24.92 ml/m LA Biplane Vol: 60.5 ml 31.61 ml/m  AORTIC VALVE AV Area (Vmax):    2.03 cm AV Area (Vmean):   2.02 cm AV Area (VTI):     2.35 cm AV Vmax:           154.00 cm/s  AV Vmean:          101.000 cm/s AV VTI:            0.284 m AV Peak Grad:      9.5 mmHg AV Mean Grad:      5.0 mmHg LVOT Vmax:  110.00 cm/s LVOT Vmean:        72.000 cm/s LVOT VTI:          0.235 m LVOT/AV VTI ratio: 0.83  AORTA Ao Root diam: 3.10 cm MITRAL VALVE                TRICUSPID VALVE MV Area (PHT): 4.60 cm     TR Peak grad:   19.5 mmHg MV Decel Time: 165 msec     TR Vmax:        221.00 cm/s MV E velocity: 140.00 cm/s                             SHUNTS                             Systemic VTI:  0.24 m                             Systemic Diam: 1.90 cm Rozann Lesches MD Electronically signed by Rozann Lesches MD Signature Date/Time: 08/15/2019/5:18:17 PM    Final    DG HIP UNILAT WITH PELVIS 1V RIGHT  Result Date: 08/26/2019 CLINICAL DATA:  85 year old female status post fall. EXAM: DG HIP (WITH OR WITHOUT PELVIS) 1V RIGHT COMPARISON:  Right hip series 02/17/2009. CT Abdomen and Pelvis 8218. FINDINGS: Chronic bilateral total hip arthroplasty. No pelvis fracture identified. Visible left hip hardware and proximal left femur appear grossly intact. Right hip total arthroplasty hardware appears intact and normally aligned. No acute osseous abnormality identified. Chronic lower lumbar posterior decompression. Negative visible lower abdominal and pelvic visceral contours. Bilateral femoral artery calcified atherosclerosis. IMPRESSION: Chronic bilateral total hip arthroplasty with no acute fracture or dislocation identified about the right hip or pelvis. Electronically Signed   By: Genevie Ann M.D.   On: 08/26/2019 19:14    Labs:  Basic Metabolic Panel: Recent Labs  Lab 08/21/19 0616 08/22/19 0612 08/23/19 0911 08/24/19 0615 08/26/19 2027 08/29/19 0200 2019/08/29 0216  NA 141 141 140 140 140 146* 143  K 3.6 3.4* 4.4 4.0 4.5 6.7* 6.5*  CL 97* 98 100 98 104 109  --   CO2 34* 33* 29 32 20* 9*  --   GLUCOSE 112* 111* 132* 117* 384* 320*  --   BUN 24* 22 24* 23 33* 31*  --   CREATININE 1.13*  1.01* 1.13* 0.93 1.35* 1.93*  --   CALCIUM 8.7* 8.5* 9.0 9.0 8.4* 8.6*  --     CBC: Recent Labs  Lab 08/23/19 0911 08/23/19 0911 08/26/19 2027 08-29-2019 0200 2019-08-29 0216  WBC 5.5  --  11.4* 28.4*  --   HGB 9.7*   < > 6.5* 5.9* 5.8*  HCT 34.2*   < > 23.2* 22.4* 17.0*  MCV 94.5  --  95.5 105.2*  --   PLT 153  --  164 PLATELET CLUMPS NOTED ON SMEAR, UNABLE TO ESTIMATE  --    < > = values in this interval not displayed.    CBG: Recent Labs  Lab 08/26/19 1935  GLUCAP 296*    Brief HPI:   ABREE ROMICK is a 85 y.o. female with history of HTN, OSA-on CPAP, CAF-on Coumadin, chronic diastolic CHF, morbid obesity, chronic pain, recent admission for left femur fracture and left humerus fracture requiring surgical intervention on 07/20/2019 and was discharged to SNF  for rehab.  She was discharged home on 07/03 but readmitted on 07/04 with shortness of breath and being daily due to CHF with exacerbation and progressive dyspnea.  She was treated with IV diuresis as well as nonrebreather mask.  She was found to have hypomagnesemia as well as anemia with hemoglobin 8.7.  Dyspnea had resolved with IV diuresis however she continued to require supplemental oxygen per [place].  Therapy was ongoing however patient continued to be limited by balance deficits, hypoxia and shortness of breath and fatigue with minimal activity.  CIR was recommended due to functional deficits.   Hospital Course: ATHALEE ESTERLINE was admitted to rehab 08/18/2019 for inpatient therapies to consist of PT and OT at least three hours five days a week. Past admission physiatrist, therapy team and rehab RN have worked together to provide customized collaborative inpatient rehab. She required max assist with ADL tasks and mod assist with mobility.  She was maintained on coumadin with pharmacy assisting with management and dosing. She did have rise in INR to 3.6 and follow up serial CBC showed that H/H was stable and improving. Magnesium  supplement added due to manage chronic hypomagnesemia as well as IV supplementation on 07/09 with improvement in Mg level. CHF monitored with daily weight and slight increase noted without signs of overload.   She was showing improvement in ability to carry out ADLs.  She required min assist for upper and lower body bathing and for upper body dressing.  She required mod assist for lower body dressing.  She is progressing well with therapy with improvement in standing balance as well as mobility.  She was able to perform transfers with min assist and required contact-guard assist to ambulate 150 feet with SPC.  She did continue to have DOE as well as anxiety about falling.  She did have assisted fall while with physical therapy on 07/15 p.m due to BLE buckling.  She had high levels of anxiety as well as fear from falls with reports of pain in right shoulder as well as concerns of right hip and right knee injury.  X-rays of shoulder, hip and knee ordered for work-up.  Skin tear right knee and left ankle treated with foam dressing.   Later that evening patient developed increased work of breathing with shortness of breath as well as intermittent confusion.  Stat ABG ordered and she was placed on nonrebreather mask due to reports of dyspnea and was later weaned down to 5 L per .  BMET and CBC ordered for work up and showed drop in H/H to 6.5/23.2.  Orders placed to T& C and transfuse for 1 unit PRBC.  She developed worsening of shortness of breath with PEA arrest/Code Blue requiring intubation. She was transferred to ICU on 09-21-2019 for management and closer monitoring.    Medications at discharge: 1.  Tylenol 650 mg 3 times daily. 2.  Allopurinol 300 mg p.o. per day 3.  Amlodipine 5 mg p.o. per day 4.  Vitamin D 3000 units p.o. per day 5.  Alphagan 0.2% 1 GTT OU right eye 6.  Pepcid 10 mg p.o. per day 7.  Cosopt 22.3/6.81 GGT OU twice daily 8.  Hydrocodone 5/325 mg 1/2-1 tab every 6 hrs prn moderate to  severe severe pain. 9.  Niferex 150 mg p.o. per day 10.  Xalatan 0.005% 1 GGT OU nightly 11.  Lidocaine patch to chest wall 12.  Mag-Ox 200 mg p.o. twice daily. 13.  Bystolic 10 mg p.o. nightly  14.  Protonix 40 mg p.o. per day 15.  K-Dur 20 mEq p.o. twice daily 16.  Florastor 1 p.o. per day 17.  Coumadin per pharmacy   Disposition: Acute hospital.     Signed: Bary Leriche 09-12-2019, 6:07 PM

## 2019-09-12 NOTE — Progress Notes (Signed)
ABG was drawn from a-line, however hemoglobin unreadable on sample.  During this time, family was brought to bedside by MD to say "good bye" and pt deceased shortly after.

## 2019-09-12 NOTE — Progress Notes (Signed)
This nurse arrived to patients room bc patient complained of trouble breathing. On arrival patient was spitting up frothy sputum. This nurse sat patient up in bed, attached pulse ox, set up suction as well as assisted with incentive spirometer use. Pts breathing was midly labored at this time. T- 98.3 BP 101/53, P- 77, SP02 100 wiith 02 at 3L. Lonzo Cloud B administered Norco 325 mg. Rapid response was called for a second set of eyes, Mindy arrived and completed her assessment. Pt was becoming increasingly confused during exam. Contacted PA  And NRB, EKG, PCXR, ABG, CBC, and new PIV ordered and started. Compazine was given for nausea. All other medications held. Lab called with critical lab value of hemoglobin 6.5. PA notified and one unit of blood ordered. Type and screen ordered and completed. After type and screen pts status appeared to decline. Called Rapid response for another set of eyes. Returned to patient and checked for pulse and called the code at 23:49. CPR started and Code team arrived and continued with standard procedure.  Family and PA notified.  Pt was then discharged to 62m11 this nurse followed pt along with rapid to 952M. Pt left in care of 952M at 130 am  [employer] [facility]

## 2019-09-12 NOTE — Progress Notes (Addendum)
Patient had complaints of pain in shoulder and generalized body pain rating 8 out of 10. Patient also complains of not being able to feel oxygen via nasal cannula. assessed oxygen cannula and checked patient's oxygen saturation. Patients levels were between 96-98 % on 2L of oxygen. HOB was raised and patient performed cough and deep breathing exercises. Will continue to monitor and report to night shift nurse. Amanda Cockayne, LPN

## 2019-09-12 DEATH — deceased

## 2019-10-26 ENCOUNTER — Ambulatory Visit: Payer: Medicare Other | Admitting: Podiatry

## 2019-11-01 ENCOUNTER — Ambulatory Visit: Payer: Medicare Other | Admitting: Cardiology

## 2021-09-02 IMAGING — DX DG CHEST 2V
2 series · 2 of 2 positions shown · non-contrast
Comparison: Radiograph 12/27/2018.  CT 07/13/2018

CLINICAL DATA: Awoke from sleep with shortness of breath.

EXAM:
CHEST - 2 VIEW

[chest lat]
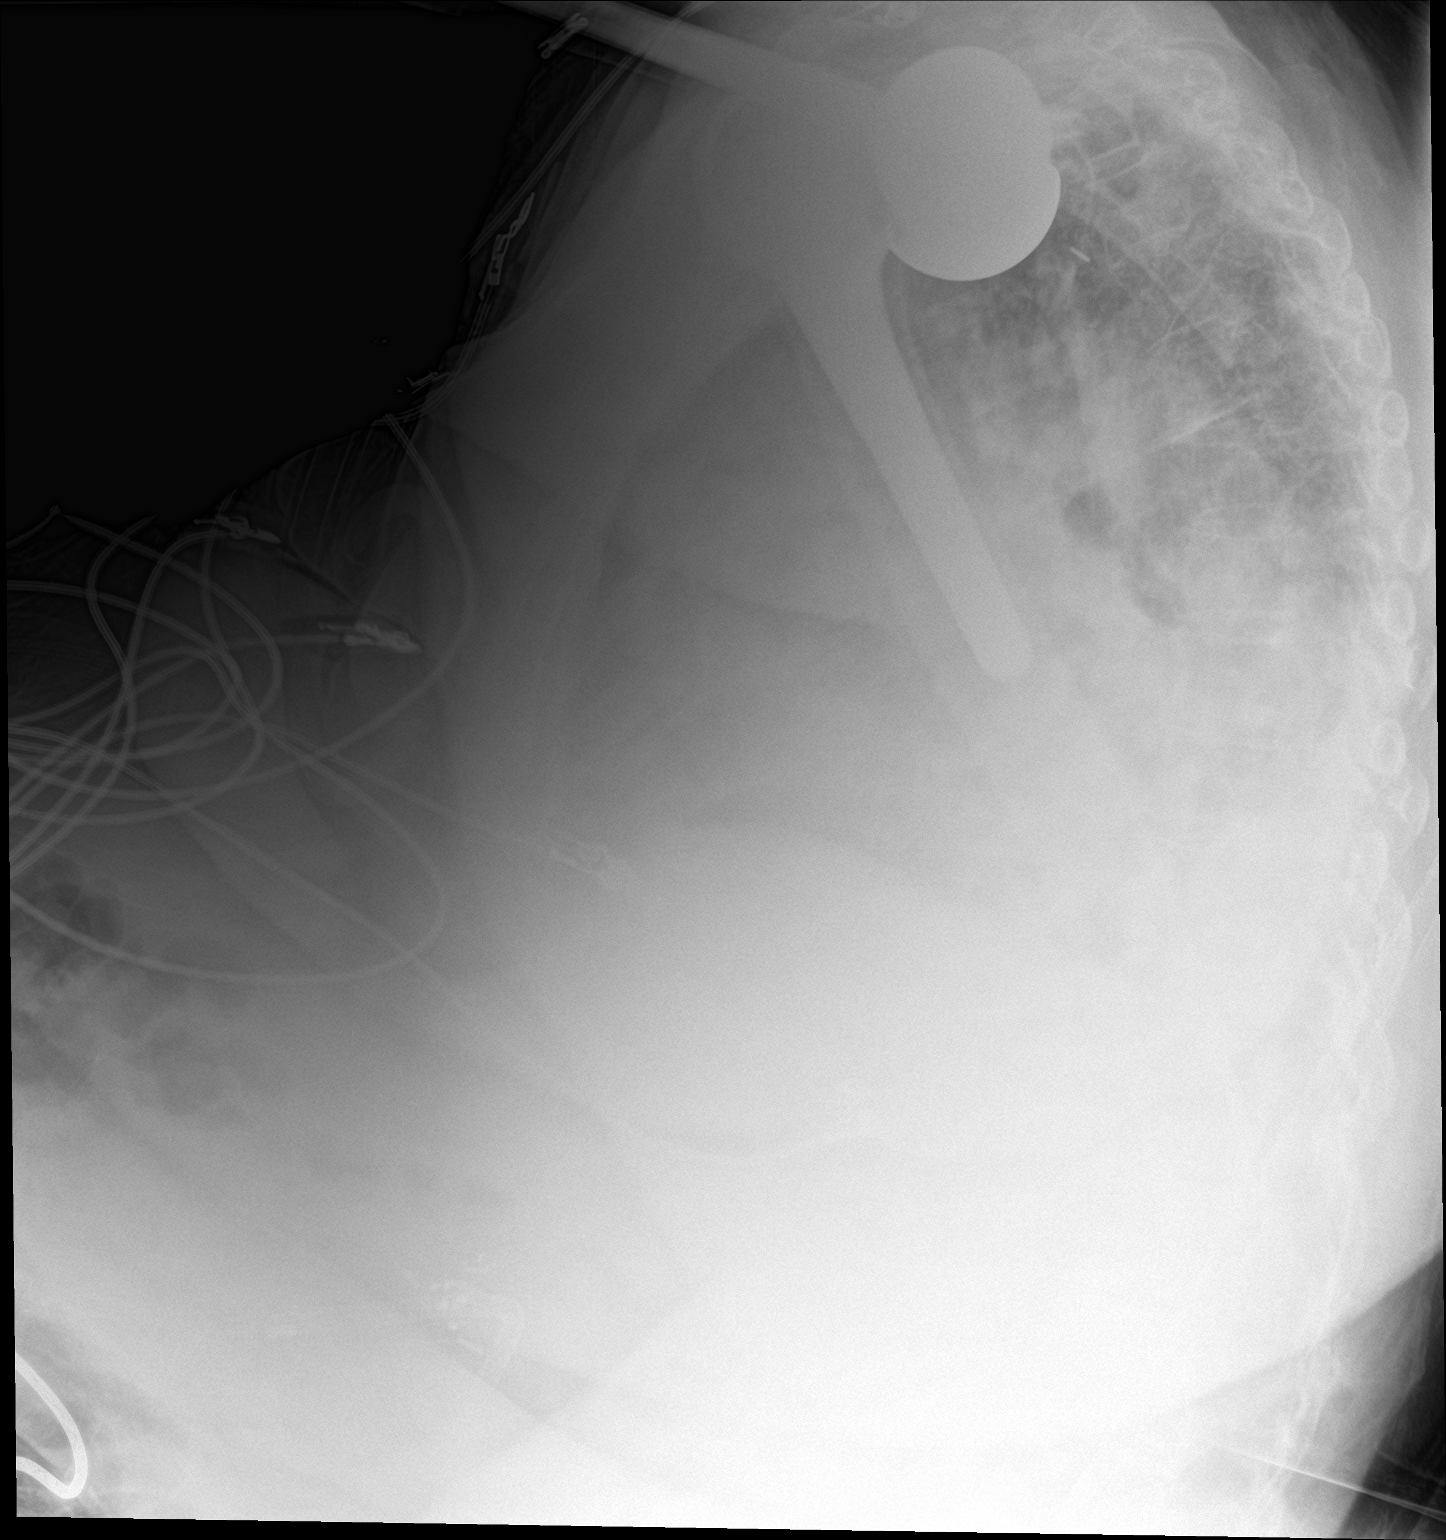

[chest ap]
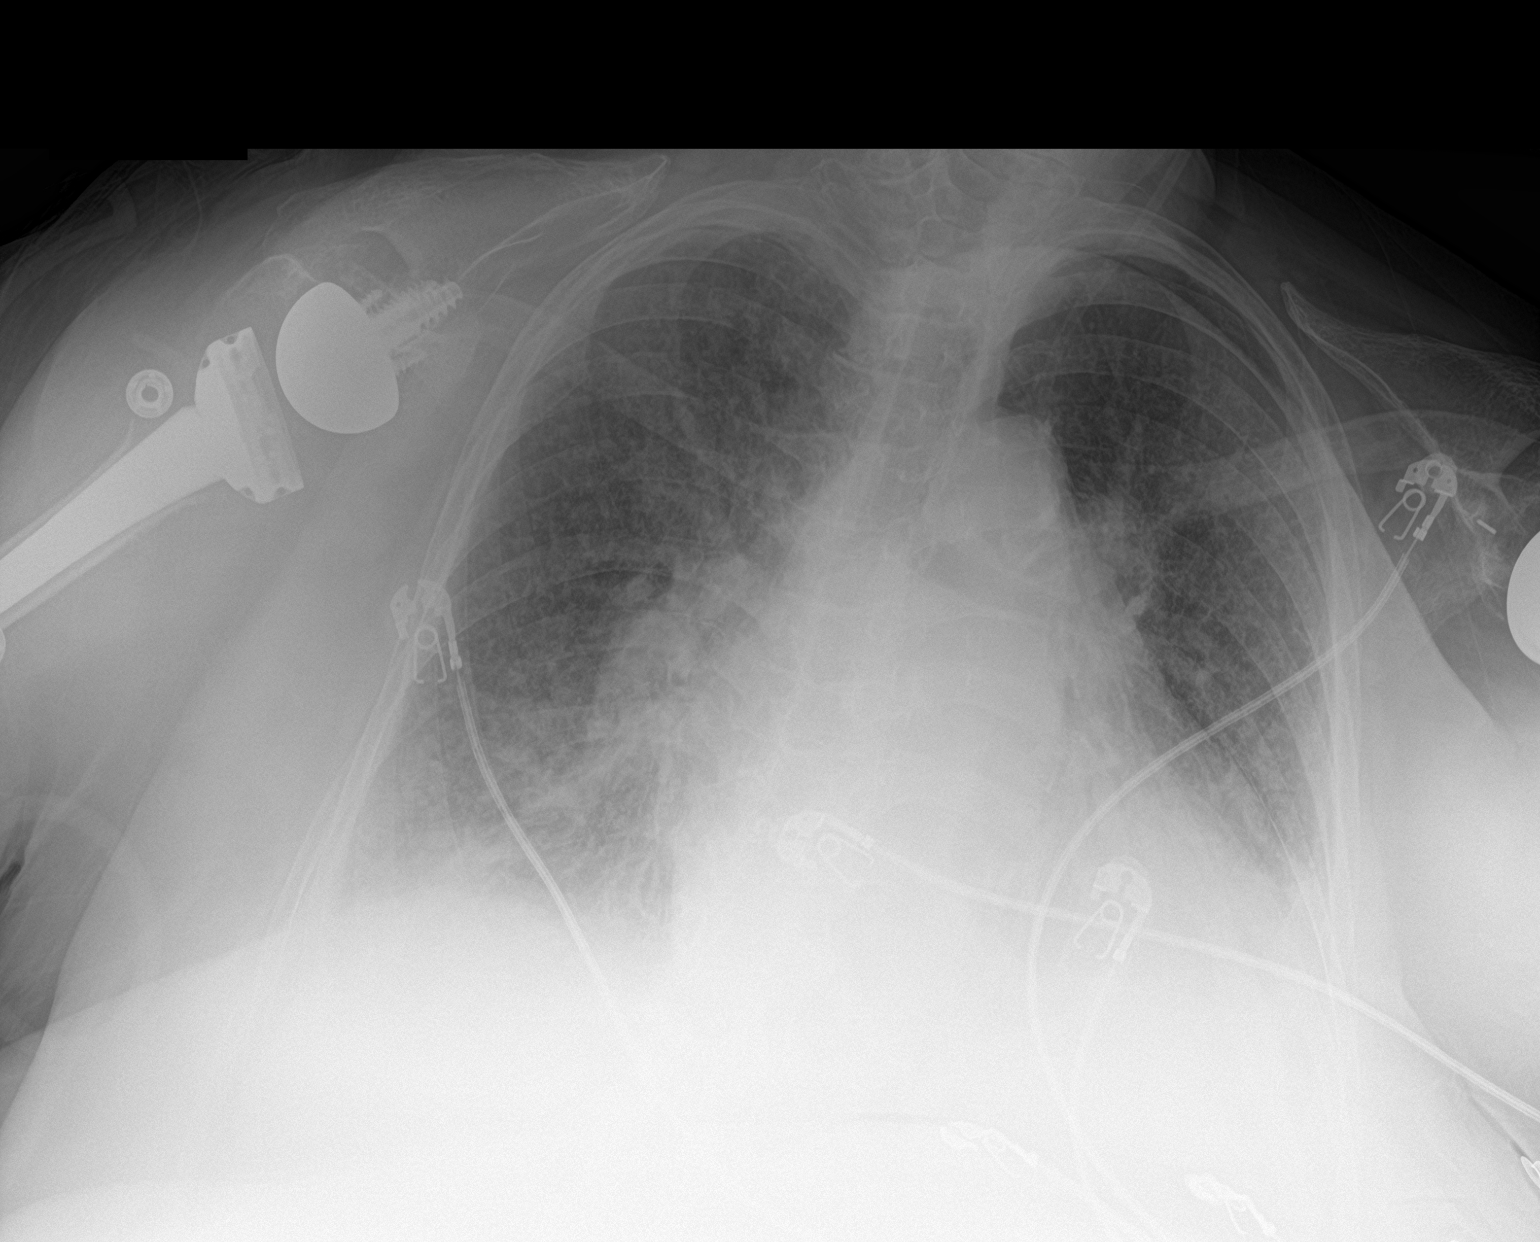

[2 of 2 positions shown; findings below may reference images not displayed]

FINDINGS: Cardiomegaly. Diffuse interstitial and peribronchial thickening
suspicious for pulmonary edema. Small bilateral pleural effusions
and hazy lung base opacities. No pneumothorax. Bilateral shoulder
arthroplasties.
IMPRESSION: Cardiomegaly with pulmonary edema and small bilateral pleural
effusions. Findings most consistent with CHF.
